# Patient Record
Sex: Male | Born: 1940
Health system: Southern US, Community
[De-identification: ages and names within clinical notes are randomized; demographics above are authoritative.]

## PROBLEM LIST (undated history)

## (undated) DIAGNOSIS — I714 Abdominal aortic aneurysm, without rupture, unspecified: Secondary | ICD-10-CM

## (undated) DIAGNOSIS — Z923 Personal history of irradiation: Secondary | ICD-10-CM

## (undated) DIAGNOSIS — K760 Fatty (change of) liver, not elsewhere classified: Secondary | ICD-10-CM

## (undated) DIAGNOSIS — E785 Hyperlipidemia, unspecified: Secondary | ICD-10-CM

## (undated) DIAGNOSIS — F17200 Nicotine dependence, unspecified, uncomplicated: Secondary | ICD-10-CM

## (undated) DIAGNOSIS — N139 Obstructive and reflux uropathy, unspecified: Secondary | ICD-10-CM

## (undated) DIAGNOSIS — N400 Enlarged prostate without lower urinary tract symptoms: Secondary | ICD-10-CM

## (undated) DIAGNOSIS — I10 Essential (primary) hypertension: Secondary | ICD-10-CM

## (undated) DIAGNOSIS — C3490 Malignant neoplasm of unspecified part of unspecified bronchus or lung: Secondary | ICD-10-CM

## (undated) DIAGNOSIS — G40909 Epilepsy, unspecified, not intractable, without status epilepticus: Secondary | ICD-10-CM

## (undated) DIAGNOSIS — M549 Dorsalgia, unspecified: Secondary | ICD-10-CM

## (undated) DIAGNOSIS — I2510 Atherosclerotic heart disease of native coronary artery without angina pectoris: Secondary | ICD-10-CM

## (undated) DIAGNOSIS — R972 Elevated prostate specific antigen [PSA]: Secondary | ICD-10-CM

## (undated) DIAGNOSIS — J3801 Paralysis of vocal cords and larynx, unilateral: Secondary | ICD-10-CM

## (undated) DIAGNOSIS — I739 Peripheral vascular disease, unspecified: Secondary | ICD-10-CM

## (undated) DIAGNOSIS — R310 Gross hematuria: Secondary | ICD-10-CM

## (undated) DIAGNOSIS — R0602 Shortness of breath: Secondary | ICD-10-CM

## (undated) DIAGNOSIS — K5790 Diverticulosis of intestine, part unspecified, without perforation or abscess without bleeding: Secondary | ICD-10-CM

## (undated) DIAGNOSIS — H9190 Unspecified hearing loss, unspecified ear: Secondary | ICD-10-CM

## (undated) DIAGNOSIS — I219 Acute myocardial infarction, unspecified: Secondary | ICD-10-CM

## (undated) DIAGNOSIS — K8020 Calculus of gallbladder without cholecystitis without obstruction: Secondary | ICD-10-CM

## (undated) DIAGNOSIS — J189 Pneumonia, unspecified organism: Secondary | ICD-10-CM

## (undated) DIAGNOSIS — C3412 Malignant neoplasm of upper lobe, left bronchus or lung: Secondary | ICD-10-CM

## (undated) HISTORY — DX: Diverticulosis of intestine, part unspecified, without perforation or abscess without bleeding: K57.90

## (undated) HISTORY — DX: Hyperlipidemia, unspecified: E78.5

## (undated) HISTORY — DX: Elevated prostate specific antigen (PSA): R97.20

## (undated) HISTORY — DX: Abdominal aortic aneurysm, without rupture, unspecified: I71.40

## (undated) HISTORY — DX: Nicotine dependence, unspecified, uncomplicated: F17.200

## (undated) HISTORY — DX: Gross hematuria: R31.0

## (undated) HISTORY — DX: Atherosclerotic heart disease of native coronary artery without angina pectoris: I25.10

## (undated) HISTORY — DX: Acute myocardial infarction, unspecified: I21.9

## (undated) HISTORY — PX: ROTATOR CUFF REPAIR: SHX139

## (undated) HISTORY — PX: HERNIA REPAIR: SHX51

## (undated) HISTORY — DX: Fatty (change of) liver, not elsewhere classified: K76.0

## (undated) HISTORY — DX: Pneumonia, unspecified organism: J18.9

## (undated) HISTORY — DX: Calculus of gallbladder without cholecystitis without obstruction: K80.20

## (undated) HISTORY — DX: Malignant neoplasm of upper lobe, left bronchus or lung: C34.12

## (undated) HISTORY — PX: CORONARY ANGIOPLASTY: SHX604

## (undated) HISTORY — DX: Paralysis of vocal cords and larynx, unilateral: J38.01

## (undated) HISTORY — DX: Epilepsy, unspecified, not intractable, without status epilepticus: G40.909

## (undated) HISTORY — DX: Benign prostatic hyperplasia without lower urinary tract symptoms: N40.0

## (undated) HISTORY — PX: PILONIDAL CYST EXCISION: SHX744

## (undated) HISTORY — DX: Malignant neoplasm of unspecified part of unspecified bronchus or lung: C34.90

## (undated) HISTORY — DX: Obstructive and reflux uropathy, unspecified: N13.9

## (undated) HISTORY — DX: Dorsalgia, unspecified: M54.9

## (undated) HISTORY — PX: OTHER SURGICAL HISTORY: SHX169

## (undated) HISTORY — DX: Abdominal aortic aneurysm, without rupture: I71.4

---

## 1990-08-21 DIAGNOSIS — I219 Acute myocardial infarction, unspecified: Secondary | ICD-10-CM

## 1990-08-21 HISTORY — DX: Acute myocardial infarction, unspecified: I21.9

## 1990-08-21 HISTORY — PX: PTCA: SHX146

## 2001-04-12 ENCOUNTER — Emergency Department (HOSPITAL_COMMUNITY): Admission: EM | Admit: 2001-04-12 | Discharge: 2001-04-12 | Payer: Self-pay | Admitting: Emergency Medicine

## 2001-08-21 HISTORY — PX: COLONOSCOPY W/ POLYPECTOMY: SHX1380

## 2003-07-21 ENCOUNTER — Ambulatory Visit (HOSPITAL_COMMUNITY): Admission: RE | Admit: 2003-07-21 | Discharge: 2003-07-21 | Payer: Self-pay | Admitting: *Deleted

## 2003-07-21 ENCOUNTER — Encounter (INDEPENDENT_AMBULATORY_CARE_PROVIDER_SITE_OTHER): Payer: Self-pay | Admitting: Specialist

## 2007-04-25 ENCOUNTER — Encounter (INDEPENDENT_AMBULATORY_CARE_PROVIDER_SITE_OTHER): Payer: Self-pay | Admitting: *Deleted

## 2007-05-17 ENCOUNTER — Ambulatory Visit: Payer: Self-pay | Admitting: Internal Medicine

## 2007-05-18 LAB — CONVERTED CEMR LAB
ALT: 32 units/L (ref 0–53)
AST: 26 units/L (ref 0–37)
Cholesterol: 153 mg/dL (ref 0–200)
HDL: 48.5 mg/dL (ref >39.0)
LDL Cholesterol: 77 mg/dL (ref 0–99)
PSA: 3.33 ng/mL (ref 0.10–4.00)
Total CHOL/HDL Ratio: 3.2
Triglycerides: 136 mg/dL (ref 0–149)
VLDL: 27 mg/dL (ref 0–40)

## 2007-05-20 ENCOUNTER — Encounter (INDEPENDENT_AMBULATORY_CARE_PROVIDER_SITE_OTHER): Payer: Self-pay | Admitting: *Deleted

## 2007-05-23 ENCOUNTER — Ambulatory Visit: Payer: Self-pay | Admitting: Internal Medicine

## 2007-05-23 DIAGNOSIS — I252 Old myocardial infarction: Secondary | ICD-10-CM | POA: Insufficient documentation

## 2007-05-23 DIAGNOSIS — E785 Hyperlipidemia, unspecified: Secondary | ICD-10-CM | POA: Insufficient documentation

## 2007-05-27 ENCOUNTER — Encounter: Payer: Self-pay | Admitting: Internal Medicine

## 2007-07-02 ENCOUNTER — Ambulatory Visit: Payer: Self-pay | Admitting: Internal Medicine

## 2007-07-03 ENCOUNTER — Ambulatory Visit: Payer: Self-pay | Admitting: Internal Medicine

## 2007-07-05 ENCOUNTER — Encounter (INDEPENDENT_AMBULATORY_CARE_PROVIDER_SITE_OTHER): Payer: Self-pay | Admitting: *Deleted

## 2007-07-05 LAB — CONVERTED CEMR LAB
Creatinine,U: 43 mg/dL
Glucose, Bld: 113 mg/dL — ABNORMAL HIGH (ref 70–99)
Hgb A1c MFr Bld: 5.7 % (ref 4.6–6.0)
Microalb Creat Ratio: 20.9 mg/g (ref 0.0–30.0)
Microalb, Ur: 0.9 mg/dL (ref 0.0–1.9)

## 2007-09-06 ENCOUNTER — Encounter: Payer: Self-pay | Admitting: Internal Medicine

## 2008-02-24 ENCOUNTER — Ambulatory Visit: Payer: Self-pay | Admitting: Internal Medicine

## 2008-02-24 LAB — CONVERTED CEMR LAB
Bilirubin Urine: NEGATIVE
Blood in Urine, dipstick: NEGATIVE
Glucose, Urine, Semiquant: NEGATIVE
Ketones, urine, test strip: NEGATIVE
Nitrite: NEGATIVE
Protein, U semiquant: NEGATIVE
Specific Gravity, Urine: <1.005
Urobilinogen, UA: 0.2
WBC Urine, dipstick: NEGATIVE
pH: 7

## 2008-04-16 ENCOUNTER — Telehealth (INDEPENDENT_AMBULATORY_CARE_PROVIDER_SITE_OTHER): Payer: Self-pay | Admitting: *Deleted

## 2008-05-08 ENCOUNTER — Ambulatory Visit: Payer: Self-pay | Admitting: Internal Medicine

## 2008-05-08 LAB — CONVERTED CEMR LAB
ALT: 37 units/L (ref 0–53)
AST: 33 units/L (ref 0–37)
Albumin: 4 g/dL (ref 3.5–5.2)
Alkaline Phosphatase: 44 units/L (ref 39–117)
BUN: 13 mg/dL (ref 6–23)
Basophils Absolute: 0 10*3/uL (ref 0.0–0.1)
Basophils Relative: 0.3 % (ref 0.0–3.0)
Bilirubin, Direct: 0.1 mg/dL (ref 0.0–0.3)
CO2: 30 meq/L (ref 19–32)
Calcium: 9.6 mg/dL (ref 8.4–10.5)
Chloride: 108 meq/L (ref 96–112)
Cholesterol: 94 mg/dL (ref 0–200)
Creatinine, Ser: 0.9 mg/dL (ref 0.4–1.5)
Eosinophils Absolute: 0.2 10*3/uL (ref 0.0–0.7)
Eosinophils Relative: 2.6 % (ref 0.0–5.0)
GFR calc Af Amer: 109 mL/min
GFR calc non Af Amer: 90 mL/min
Glucose, Bld: 108 mg/dL — ABNORMAL HIGH (ref 70–99)
HCT: 45.4 % (ref 39.0–52.0)
HDL: 34.5 mg/dL — ABNORMAL LOW (ref >39.0)
Hemoglobin: 16 g/dL (ref 13.0–17.0)
Hgb A1c MFr Bld: 5.6 % (ref 4.6–6.0)
LDL Cholesterol: 48 mg/dL (ref 0–99)
Lymphocytes Relative: 20.3 % (ref 12.0–46.0)
MCHC: 35.2 g/dL (ref 30.0–36.0)
MCV: 94.8 fL (ref 78.0–100.0)
Monocytes Absolute: 0.4 10*3/uL (ref 0.1–1.0)
Monocytes Relative: 5.2 % (ref 3.0–12.0)
Neutro Abs: 5.8 10*3/uL (ref 1.4–7.7)
Neutrophils Relative %: 71.6 % (ref 43.0–77.0)
PSA: 3.2 ng/mL (ref 0.10–4.00)
Platelets: 252 10*3/uL (ref 150–400)
Potassium: 5.5 meq/L — ABNORMAL HIGH (ref 3.5–5.1)
RBC: 4.8 M/uL (ref 4.22–5.81)
RDW: 13.9 % (ref 11.5–14.6)
Sodium: 142 meq/L (ref 135–145)
TSH: 1.02 microintl units/mL (ref 0.35–5.50)
Total Bilirubin: 0.7 mg/dL (ref 0.3–1.2)
Total CHOL/HDL Ratio: 2.7
Total Protein: 7.4 g/dL (ref 6.0–8.3)
Triglycerides: 56 mg/dL (ref 0–149)
VLDL: 11 mg/dL (ref 0–40)
WBC: 8 10*3/uL (ref 4.5–10.5)

## 2008-05-15 ENCOUNTER — Ambulatory Visit: Payer: Self-pay | Admitting: Internal Medicine

## 2008-05-15 DIAGNOSIS — R7309 Other abnormal glucose: Secondary | ICD-10-CM | POA: Insufficient documentation

## 2008-07-09 ENCOUNTER — Encounter: Payer: Self-pay | Admitting: Internal Medicine

## 2009-06-30 ENCOUNTER — Encounter: Payer: Self-pay | Admitting: Internal Medicine

## 2009-07-01 ENCOUNTER — Ambulatory Visit: Payer: Self-pay | Admitting: Internal Medicine

## 2009-07-01 LAB — CONVERTED CEMR LAB
ALT: 31 units/L (ref 0–53)
AST: 31 units/L (ref 0–37)
Albumin: 4.1 g/dL (ref 3.5–5.2)
Alkaline Phosphatase: 52 units/L (ref 39–117)
BUN: 10 mg/dL (ref 6–23)
Basophils Absolute: 0 10*3/uL (ref 0.0–0.1)
Basophils Relative: 0.4 % (ref 0.0–3.0)
Bilirubin, Direct: 0.1 mg/dL (ref 0.0–0.3)
CO2: 28 meq/L (ref 19–32)
Calcium: 9.4 mg/dL (ref 8.4–10.5)
Chloride: 105 meq/L (ref 96–112)
Cholesterol: 137 mg/dL (ref 0–200)
Creatinine, Ser: 0.7 mg/dL (ref 0.4–1.5)
Eosinophils Absolute: 0.2 10*3/uL (ref 0.0–0.7)
Eosinophils Relative: 2.9 % (ref 0.0–5.0)
GFR calc non Af Amer: 119.4 mL/min (ref >60)
Glucose, Bld: 110 mg/dL — ABNORMAL HIGH (ref 70–99)
HCT: 44.5 % (ref 39.0–52.0)
HDL: 47.3 mg/dL (ref >39.00)
Hemoglobin: 15.1 g/dL (ref 13.0–17.0)
Hgb A1c MFr Bld: 5.7 % (ref 4.6–6.5)
LDL Cholesterol: 80 mg/dL (ref 0–99)
Lymphocytes Relative: 27.5 % (ref 12.0–46.0)
Lymphs Abs: 1.6 10*3/uL (ref 0.7–4.0)
MCHC: 34 g/dL (ref 30.0–36.0)
MCV: 96.5 fL (ref 78.0–100.0)
Monocytes Absolute: 0.5 10*3/uL (ref 0.1–1.0)
Monocytes Relative: 8.4 % (ref 3.0–12.0)
Neutro Abs: 3.5 10*3/uL (ref 1.4–7.7)
Neutrophils Relative %: 60.8 % (ref 43.0–77.0)
PSA: 3.01 ng/mL (ref 0.10–4.00)
Platelets: 229 10*3/uL (ref 150.0–400.0)
Potassium: 4.9 meq/L (ref 3.5–5.1)
RBC: 4.61 M/uL (ref 4.22–5.81)
RDW: 13.6 % (ref 11.5–14.6)
Sodium: 141 meq/L (ref 135–145)
Total Bilirubin: 0.7 mg/dL (ref 0.3–1.2)
Total CHOL/HDL Ratio: 3
Total Protein: 7 g/dL (ref 6.0–8.3)
Triglycerides: 47 mg/dL (ref 0.0–149.0)
VLDL: 9.4 mg/dL (ref 0.0–40.0)
WBC: 5.8 10*3/uL (ref 4.5–10.5)

## 2009-07-08 ENCOUNTER — Ambulatory Visit: Payer: Self-pay | Admitting: Internal Medicine

## 2009-09-24 ENCOUNTER — Telehealth: Payer: Self-pay | Admitting: Internal Medicine

## 2009-10-01 ENCOUNTER — Encounter: Payer: Self-pay | Admitting: Internal Medicine

## 2010-02-24 ENCOUNTER — Ambulatory Visit: Payer: Self-pay | Admitting: Internal Medicine

## 2010-02-24 DIAGNOSIS — D126 Benign neoplasm of colon, unspecified: Secondary | ICD-10-CM | POA: Insufficient documentation

## 2010-02-24 DIAGNOSIS — Z8669 Personal history of other diseases of the nervous system and sense organs: Secondary | ICD-10-CM | POA: Insufficient documentation

## 2010-02-24 LAB — CONVERTED CEMR LAB
Bilirubin Urine: NEGATIVE
Blood in Urine, dipstick: NEGATIVE
Cholesterol, target level: 200 mg/dL
Glucose, Urine, Semiquant: NEGATIVE
HDL goal, serum: 40 mg/dL
Ketones, urine, test strip: NEGATIVE
LDL Goal: 100 mg/dL
Nitrite: NEGATIVE
Protein, U semiquant: NEGATIVE
Specific Gravity, Urine: <1.005
Urobilinogen, UA: 0.2
WBC Urine, dipstick: NEGATIVE
pH: 7

## 2010-03-15 ENCOUNTER — Encounter (INDEPENDENT_AMBULATORY_CARE_PROVIDER_SITE_OTHER): Payer: Self-pay | Admitting: *Deleted

## 2010-03-17 ENCOUNTER — Ambulatory Visit: Payer: Self-pay | Admitting: Internal Medicine

## 2010-03-31 ENCOUNTER — Ambulatory Visit: Payer: Self-pay | Admitting: Internal Medicine

## 2010-03-31 ENCOUNTER — Telehealth: Payer: Self-pay | Admitting: Internal Medicine

## 2010-03-31 LAB — HM COLONOSCOPY

## 2010-05-06 ENCOUNTER — Telehealth (INDEPENDENT_AMBULATORY_CARE_PROVIDER_SITE_OTHER): Payer: Self-pay | Admitting: *Deleted

## 2010-06-08 ENCOUNTER — Telehealth (INDEPENDENT_AMBULATORY_CARE_PROVIDER_SITE_OTHER): Payer: Self-pay | Admitting: *Deleted

## 2010-06-10 ENCOUNTER — Encounter: Payer: Self-pay | Admitting: Internal Medicine

## 2010-06-15 ENCOUNTER — Telehealth: Payer: Self-pay | Admitting: Internal Medicine

## 2010-06-28 ENCOUNTER — Ambulatory Visit: Payer: Self-pay | Admitting: Internal Medicine

## 2010-07-05 ENCOUNTER — Ambulatory Visit: Payer: Self-pay | Admitting: Internal Medicine

## 2010-07-05 DIAGNOSIS — K5730 Diverticulosis of large intestine without perforation or abscess without bleeding: Secondary | ICD-10-CM | POA: Insufficient documentation

## 2010-08-31 ENCOUNTER — Encounter: Payer: Self-pay | Admitting: Internal Medicine

## 2010-09-18 LAB — CONVERTED CEMR LAB
ALT: 26 units/L (ref 0–53)
AST: 26 units/L (ref 0–37)
Albumin: 4.1 g/dL (ref 3.5–5.2)
Alkaline Phosphatase: 44 units/L (ref 39–117)
BUN: 17 mg/dL (ref 6–23)
Basophils Absolute: 0.1 10*3/uL (ref 0.0–0.1)
Basophils Relative: 0.8 % (ref 0.0–3.0)
Bilirubin, Direct: 0.1 mg/dL (ref 0.0–0.3)
CO2: 28 meq/L (ref 19–32)
Calcium: 9.6 mg/dL (ref 8.4–10.5)
Chloride: 104 meq/L (ref 96–112)
Cholesterol: 172 mg/dL (ref 0–200)
Creatinine, Ser: 0.7 mg/dL (ref 0.4–1.5)
Eosinophils Absolute: 0.2 10*3/uL (ref 0.0–0.7)
Eosinophils Relative: 3.1 % (ref 0.0–5.0)
GFR calc non Af Amer: 113.42 mL/min (ref >60)
Glucose, Bld: 107 mg/dL — ABNORMAL HIGH (ref 70–99)
HCT: 45.4 % (ref 39.0–52.0)
HDL: 50.9 mg/dL (ref >39.00)
Hemoglobin: 15.8 g/dL (ref 13.0–17.0)
Hgb A1c MFr Bld: 5.7 % (ref 4.6–6.5)
LDL Cholesterol: 105 mg/dL — ABNORMAL HIGH (ref 0–99)
Lymphocytes Relative: 32 % (ref 12.0–46.0)
Lymphs Abs: 2.3 10*3/uL (ref 0.7–4.0)
MCHC: 34.9 g/dL (ref 30.0–36.0)
MCV: 94.3 fL (ref 78.0–100.0)
Monocytes Absolute: 0.6 10*3/uL (ref 0.1–1.0)
Monocytes Relative: 8.1 % (ref 3.0–12.0)
Neutro Abs: 3.9 10*3/uL (ref 1.4–7.7)
Neutrophils Relative %: 56 % (ref 43.0–77.0)
PSA: 3.77 ng/mL (ref 0.10–4.00)
Platelets: 235 10*3/uL (ref 150.0–400.0)
Potassium: 4.6 meq/L (ref 3.5–5.1)
RBC: 4.82 M/uL (ref 4.22–5.81)
RDW: 14.7 % — ABNORMAL HIGH (ref 11.5–14.6)
Sodium: 139 meq/L (ref 135–145)
TSH: 1.16 microintl units/mL (ref 0.35–5.50)
Total Bilirubin: 0.5 mg/dL (ref 0.3–1.2)
Total CHOL/HDL Ratio: 3
Total Protein: 6.9 g/dL (ref 6.0–8.3)
Triglycerides: 79 mg/dL (ref 0.0–149.0)
VLDL: 15.8 mg/dL (ref 0.0–40.0)
WBC: 7.1 10*3/uL (ref 4.5–10.5)

## 2010-09-20 NOTE — Miscellaneous (Signed)
Summary: [facility]  Clinical Lists Changes  Medications: Added new medication of MIRALAX   POWD (POLYETHYLENE GLYCOL 3350) As per prep  instructions. - Signed Added new medication of DULCOLAX 5 MG  TBEC (BISACODYL) Day before procedure take 2 at 3pm and 2 at 8pm. - Signed Added new medication of REGLAN 10 MG  TABS (METOCLOPRAMIDE HCL) As per prep instructions. - Signed Rx of MIRALAX   POWD (POLYETHYLENE GLYCOL 3350) As per prep  instructions.;  #255gm x 0;  Signed;  Entered by: Ezra Sites RN;  Authorized by: Hart Carwin MD;  Method used: [facility]  [phone]*, [address] [place], [place], [place]  [address], Ph: [phone] or [phone], Fax: [phone] Rx of DULCOLAX 5 MG  TBEC (BISACODYL) Day before procedure take 2 at 3pm and 2 at 8pm.;  #4 x 0;  Signed;  Entered by: Ezra Sites RN;  Authorized by: Hart Carwin MD;  Method used: [facility]  [phone]*, [address] [place], [place], [place]  [address], Ph: [phone] or [phone], Fax: [phone] Rx of REGLAN 10 MG  TABS (METOCLOPRAMIDE HCL) As per prep instructions.;  #2 x 0;  Signed;  Entered by: Ezra Sites RN;  Authorized by: Hart Carwin MD;  Method used: [facility]ically [phone]*, [address] [place], [place], [place]  [address], Ph: [phone] or [phone], Fax: [phone] Observations: Added new observation of NKA: T (03/17/2010 10:33)    Prescriptions: REGLAN 10 MG  TABS (METOCLOPRAMIDE HCL) As per prep instructions.  #2 x 0   Entered by:   Ezra Sites RN   Authorized by:   Hart Carwin MD   Signed by:   Ezra Sites RN on 03/17/2010   Method used:   [facility]        [facility] [facility] [phone]       [address]       [place], [place]  [address]       Ph: [phone] or [phone]       Fax: [phone]   RxID:   [id number] DULCOLAX 5 MG  TBEC (BISACODYL) Day before procedure take 2 at 3pm and 2 at 8pm.  #4 x 0   Entered by:   Ezra Sites  RN   Authorized by:   Hart Carwin MD   Signed by:   Ezra Sites RN on 03/17/2010   Method used:   [facility]ronically to        [facility]  [address] [phone][facility] (retail)       [address] [place], [place]  [address]       Ph: [phone] or [phone]       Fax: [phone]   RxID:   [id number] MIRALAX   POWD (POLYETHYLENE GLYCOL 3350) As per prep  instructions.  #255gm x 0   Entered by:   Ezra Sites RN   Authorized by:   Hart Carwin MD   Signed by:   Ezra Sites RN on 03/17/2010   Method used:   [facility]ronically [facility]        [facility]  [address] [phone]* (retail)       [address]       [place], [place]  [address]  Ph: [phone] or [phone]       Fax: [phone]   RxID:   [id number]

## 2010-09-20 NOTE — Letter (Signed)
Summary: Sport and exercise psychologist Admin   Imported By: Freddy Jaksch 09/26/2007 11:56:53  _____________________________________________________________________  External Attachment:    Type:   Image     Comment:   External Document

## 2010-09-20 NOTE — Progress Notes (Signed)
----   Converted from flag ---- ---- 06/08/2010 5:32 AM, Marga Melnick MD wrote: Code is CAD ------------------------------

## 2010-09-20 NOTE — Letter (Signed)
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

## 2010-09-20 NOTE — Progress Notes (Signed)
Summary: Medication Change Request (Non-Urgent)  Phone Note Refill Request Message from:  [facility] [phone]  vytorin 10-10mg ....Marland KitchenMarland KitchenMarland Kitchen May we change to simvastatin significant cost savings! Thanks!   Initial call taken by: Barb Merino,  September 24, 2009 4:48 PM  Follow-up for Phone Call        Dr.Hopper please advise on med change Follow-up by: Shonna Chock,  September 24, 2009 4:55 PM  Additional Follow-up for Phone Call Additional follow up Details #1::        Simvastatin 20 mg at bedtime #90 if patient agrees Additional Follow-up by: Marga Melnick MD,  September 24, 2009 5:30 PM    Additional Follow-up for Phone Call Additional follow up Details #2::    pt ok, rx sent to pharmacy...............Marland KitchenFelecia Deloach CMA  September 27, 2009 9:12 AM   New/Updated Medications: SIMVASTATIN 20 MG TABS (SIMVASTATIN) take 1 tab at bedtime Prescriptions: SIMVASTATIN 20 MG TABS (SIMVASTATIN) take 1 tab at bedtime  #90 x 0   Entered by:   Jeremy Johann CMA   Authorized by:   Marga Melnick MD   Signed by:   Jeremy Johann CMA on 09/27/2009   Method used:   [facility]ed to ...       [facility]  [address] [phone][facility] ([facility]ail[facility]       [address]       [place], [place]  [address]       Ph: [phone] or [phone]       Fax: [phone]   RxID:   [id number]

## 2010-09-20 NOTE — Progress Notes (Signed)
Summary: lab order added to lab appt  Phone Note Call from Patient   Caller: Patient Summary of Call: patient has lab appt scheduled for 05/08/08 need order for labs has follow up appt scheduled 05/15/08 Initial call taken by: Doristine Devoid,  April 16, 2008 8:58 AM  Follow-up for Phone Call        LIPID,hepatic panel,PSA,BMP,CBCD,TSH 995.20/600.9/272.4  DR.HOPPER THIS PATIENT WAS HERE 02/2008 FOR FLIGHT CPX-NO LABS WERE DRAWN. WOULD I ADD CPX CODE TO THESE LABS?  Follow-up by: Shonna Chock,  April 16, 2008 11:03 AM  Additional Follow-up for Phone Call Additional follow up Details #1::        no, just do labs above Additional Follow-up by: Marga Melnick MD,  April 16, 2008 5:53 PM

## 2010-09-20 NOTE — Assessment & Plan Note (Signed)
Summary: FOLLOWUP VISIT///SPH   Vital Signs:  Patient profile:   70 year old male Weight:      145.8 pounds BMI:     24.35 Pulse rate:   76 / minute Resp:     14 per minute BP sitting:   128 / 80  (left arm) Cuff size:   regular  Vitals Entered By: Shonna Chock CMA (July 05, 2010 8:53 AM) CC: Follow-up visit: discuss labs (copy given)   CC:  Follow-up visit: discuss labs (copy given).  History of Present Illness: FAA requires a Cardiac update.He has had his Nuclear Stress test 06/28/2010.No symptoms with pulse to 174 by his report. Actual tracings pending. Hyperlipidemia Follow-Up      Labs reviewed : LDL 105 ; HDL 50.90; LFTs are all normal.Glucose 107 , but A1c is in NON Diabetes range of 5.7%. The patient denies muscle aches, GI upset, abdominal pain, flushing, itching, constipation, diarrhea, and fatigue.  The patient denies the following symptoms: chest pain/pressure, exercise intolerance, dypsnea, palpitations, syncope, and pedal edema.  Compliance with medications (by patient report) has been near 100%.  Dietary compliance has been good.  The patient reports exercising 2 X per week as biking 8 mpd over 60 minutes w/o cardiopulmonary symptoms.  Adjunctive measures currently used by the patient include ASA, fish oil supplements, and Co-Q 10.   Preventive Screening-Counseling & Management  Alcohol-Tobacco     Smoking Status: quit  Current Medications (verified): 1)  Co Q10 2)  Asa 81mg  3)  Multivitamin 4)  Simvastatin 20 Mg Tabs (Simvastatin) .... Take 1 Tab At Bedtime 5)  Fish Oil 1000 Mg Caps (Omega-3 Fatty Acids) .Marland Kitchen.. 1 By Mouth Two Times A Day  Allergies (verified): No Known Drug Allergies  Past History:  Past Medical History: Myocardial infarction, PMH of in 1992 Hyperlipidemia: Framingham Study LDL goal = < 100. Diverticulosis, colon 03/2010, Dr Juanda Chance  Past Surgical History: Percutaneous transluminal coronary angioplasty 1992 Inguinal herniorrhaphy  bilaterally Colon polypectomy 2003 Dr Virginia Rochester,  no polyps in 03/2010, Dr Juanda Chance  Rotator cuff repair R & L shoulder  Pilonidal Cystectomy  Family History: Father: negative Mother: HTN Siblings: negative No FH of MI or premature CAD  Social History: heart healthy diet Retired Married Former Smoker: quit 1992 Alcohol use-yes: red wine socialy Smoking Status:  quit  Review of Systems General:  Denies sleep disorder and weight loss. Eyes:  Denies blurring, double vision, and vision loss-both eyes. ENT:  Denies difficulty swallowing and hoarseness. CV:  Denies difficulty breathing at night, difficulty breathing while lying down, leg cramps with exertion, lightheadness, and near fainting. Resp:  Denies cough, shortness of breath, and sputum productive. Derm:  Denies lesion(s), poor wound healing, and rash. Neuro:  Denies numbness, tingling, and weakness. Endo:  Denies cold intolerance, excessive hunger, excessive thirst, excessive urination, and heat intolerance. Heme:  Denies abnormal bruising and bleeding.  Physical Exam  General:  Thin but well-nourished;alert,appropriate and cooperative throughout examination Eyes:  No corneal or conjunctival inflammation noted.  Funduscopic exam benign, without hemorrhages, exudates or papilledema.  S/P Ophthalmologic exam 02/2010 Neck:  No deformities, masses, or tenderness noted. Lungs:  Normal respiratory effort, chest expands symmetrically. Lungs are clear to auscultation, no crackles or wheezes. Heart:  Normal rate and regular rhythm. S1 and S2 normal without gallop, murmur, click, rub. S4  Abdomen:  Bowel sounds positive,abdomen soft and non-tender without masses, organomegaly or hernias noted. Pulses:  R and L carotid,radial,dorsalis pedis and posterior tibial pulses are full and  equal bilaterally Extremities:  No clubbing, cyanosis, edema. Mild fungal toenail deformities  noted . Neurologic:  alert & oriented X3, gait normal, and DTRs  symmetrical and normal.   Skin:  Intact without suspicious lesions or rashes Cervical Nodes:  No lymphadenopathy noted Axillary Nodes:  No palpable lymphadenopathy Psych:  memory intact for recent and remote, normally interactive, and good eye contact.     Impression & Recommendations:  Problem # 1:  MYOCARDIAL INFARCTION, HX OF (ICD-412) stable ; no active symptoms even @ high levels of CVE  Problem # 2:  HYPERLIPIDEMIA (ICD-272.4) Lipids controlled His updated medication list for this problem includes:    Simvastatin 20 Mg Tabs (Simvastatin) .Marland Kitchen... Take 1 tab at bedtime  Problem # 3:  HYPERGLYCEMIA, FASTING (ICD-790.29) A1c is non Diabetic  Problem # 4:  AMBLYOPIA, HX OF (ICD-V12.49) as per Dr London Sheer  Complete Medication List: 1)  Co Q10  2)  Asa 81mg   3)  Multivitamin  4)  Simvastatin 20 Mg Tabs (Simvastatin) .... Take 1 tab at bedtime 5)  Fish Oil 1000 Mg Caps (Omega-3 fatty acids) .Marland Kitchen.. 1 by mouth two times a day  Other Orders: Flu Vaccine 31yrs + MEDICARE PATIENTS (U1324) Administration Flu vaccine - MCR (M0102)  Patient Instructions: 1)  It is important that you exercise regularly at least 20 minutes 5 times a week. If you develop chest pain, have severe difficulty breathing, or feel very tired , stop exercising immediately and seek medical attention. 2)  Take an  81 mg coated Aspirin every day.   Orders Added: 1)  Flu Vaccine 47yrs + MEDICARE PATIENTS [Q2039] 2)  Administration Flu vaccine - MCR [G0008] 3)  Est. Patient Level IV [[address]] Flu Vaccine Consent Questions     Do you have a history of severe allergic reactions to this vaccine? no    Any prior history of allergic reactions to egg and/or gelatin? no    Do you have a sensitivity to the preservative Thimersol? no    Do you have a past history of Guillan-Barre Syndrome? no    Do you currently have an acute febrile illness? no    Have you ever had a severe reaction to latex? no    Vaccine information  given and explained to patient? yes    Are you currently pregnant? no    Lot Number:[id number]   Exp Date:02/18/2011   Site Given  Right Deltoid IMu vaccine - MCR [G0008]      .lbmedflu

## 2010-09-20 NOTE — Miscellaneous (Signed)
Summary: Orders Update  Clinical Lists Changes  Orders: Added new Referral order of Cardiolite (Cardiolite) - Signed 

## 2010-09-20 NOTE — Progress Notes (Signed)
Summary: still waiting for stress test appointment  Phone Note Call from Patient   Caller: Patient Summary of Call: Patient called and left message that he has not heard about his stress test appointment yet---acknowledges that he does have lab appointment and followup appointment with Dr Alwyn Ren Initial call taken by: Jerolyn Shin,  June 15, 2010 3:09 PM  Follow-up for Phone Call        Chrae, in reference to the flag I sent you on this patient about there being no referral or clinicals to pre-cert for a stress test, what is the status?  I had went ahead & entered the referral for stress test, but I need to know if this is what he wants ordered & how I am supposed to pre-cert with the insurance co.  Also Dr. Alwyn Ren sent Sarah the flag about a referral for an eye exam & there also still is not one.  Please advise. Follow-up by: Magdalen Spatz [employer],  June 15, 2010 3:18 PM  Additional Follow-up for Phone Call Additional follow up Details #1::        This is for Nuclear Stress Test required by the FAA ( as done previously) ; unfortunately his insurance may not cover it since it is for the [facility] assess CAD. Additional Follow-up by: Marga Melnick MD,  June 15, 2010 4:58 PM    Additional Follow-up for Phone Call Additional follow up Details #2::    I HAVE CALLED [facility]2, HAVE LVM FOR "KATHY" TO ME BACK IN REF TO THIS PT ASAP, PRE-CERT/INSUR CODING IS WHAT IS HOLDING THIS UP, AWAITING KATHY'S CB, PT AWARE OF ALL ABOVE Magdalen Spatz [facility]  June 20, 2010 2:42 PM   REC'D CALL BACK FROM KATHY/[facility], WHO NOW HAS ME TO CALL PAT/[facility]/DR. TYSINGER'S OFFICE, ph [phone].  KATHY STATES PT HAD ONLY ROUTINE TREADMILL LAST TIME, THEN RESULTS & THE BILL WERE FORWAREDED TO DR. Aleen Campi.  I AM NOW AWAITING CB FROM PAT Magdalen Spatz [facility]  June 20, 2010 3:02 PM   Additional Follow-up for Phone Call Additional follow up Details #3:: Details for Additional Follow-up Action Taken: THE  PRIOR STRESS TEST PT HAD WAS ONLY GXT/TREADMILL ONLY/NON NUCLEAR (CPT 93015) PER [facility].  REFERRAL SENT TO [facility] FOR TREADMILL STRESS, THEY CONTACT PT, THEN CONTACT ME W/APPT INFO.  I HAVE INFORMED PATIENT.  Additional Follow-up by: Magdalen Spatz [facility],  June 22, 2010 9:05 AM

## 2010-09-20 NOTE — Assessment & Plan Note (Signed)
Summary: 3rd class cpx/cbs   Vital Signs:  Patient profile:   70 year old male Height:      65 inches Weight:      142.6 pounds BMI:     23.82 Temp:     98.5 degrees F oral Pulse rate:   76 / minute Resp:     14 per minute BP sitting:   130 / 86  (left arm) Cuff size:   regular  Vitals Entered By: Shonna Chock (February 24, 2010 1:08 PM)  Comments REVIEWED MED LIST, PATIENT AGREED DOSE AND INSTRUCTION CORRECT    History of Present Illness: [name] is here for a  FAA 3rd Class  FAA  (NOT a Medicare Preventive F/U Exam).He is asymptomatic. Hyperlipidemia Follow-Up       The patient denies muscle aches, GI upset, abdominal pain, flushing, itching, constipation, diarrhea, and fatigue.  The patient denies the following symptoms: chest pain/pressure, exercise intolerance, dypsnea, palpitations, syncope, and pedal edema.  Compliance with medications (by patient report) has been near 100%.  Dietary compliance has been good.  The patient reports exercising daily.  Adjunctive measures currently used by the patient include ASA, fish oil supplements, and Co-QA.    Lipid Management History:      Positive NCEP/ATP III risk factors include male age 82 years old or older and ASHD (either angina/prior MI/prior CABG).  Negative NCEP/ATP III risk factors include non-diabetic, no family history for ischemic heart disease, non-tobacco-user status, non-hypertensive, no prior stroke/TIA, no peripheral vascular disease, and no history of aortic aneurysm.     Preventive Screening-Counseling & Management  Alcohol-Tobacco     Alcohol drinks/day: 2 occasionally     Smoking Status: quit > 6 months     Year Quit: 1992  Caffeine-Diet-Exercise     Does Patient Exercise: yes     Type of exercise: walking , swimming  Allergies (verified): No Known Drug Allergies  Past History:  Past Medical History: Myocardial infarction, PMH of 1992 Hyperlipidemia: Framingham Study LDL goal = < 100.  Past Surgical  History: Percutaneous transluminal coronary angioplasty 1992 Inguinal herniorrhaphy bilaterally Colon polypectomy 2003 Dr Virginia Rochester, due 2009(NOT done) Rotator cuff repair R & L shoulder  Pilonidal Cystectomy  Family History: Father: negative Mother: HTN Siblings: negative  Social History: Smoking Status:  quit > 6 months  Review of Systems  The patient denies anorexia, fever, vision loss, decreased hearing, hoarseness, prolonged cough, headaches, hemoptysis, abdominal pain, melena, hematochezia, severe indigestion/heartburn, hematuria, incontinence, suspicious skin lesions, depression, unusual weight change, abnormal bleeding, enlarged lymph nodes, and angioedema.         Weight stable; he has seasonal weight variance. Nasal congestion post smoke exposure 06/29-07/02 @ beach.  Physical Exam  General:  Thin but well-nourished,in no acute distress; alert,appropriate and cooperative throughout examination Head:  Normocephalic and atraumatic without obvious abnormalities. No apparent alopecia; beard & moustache Eyes:  No corneal or conjunctival inflammation noted. EOMI. Perrla. Funduscopic exam benign, without hemorrhages, exudates or papilledema. Arteriolar narrowing. Vision grossly normal. Ears:  External ear exam shows no significant lesions or deformities.  Otoscopic examination reveals clear canals, tympanic membranes are intact bilaterally without bulging, retraction, inflammation or discharge. Hearing is grossly normal bilaterally. Whisper @ 6 feet Nose:  External nasal examination shows no deformity or inflammation. Nasal mucosa are pink and moist without lesions or exudates. Mouth:  Oral mucosa and oropharynx without lesions or exudates.  Teeth in good repair. Neck:  No deformities, masses, or tenderness noted. Lungs:  Normal  respiratory effort, chest expands symmetrically. Lungs are clear to auscultation, no crackles or wheezes. Heart:  normal rate, regular rhythm, no gallop, no rub,  no JVD, no HJR, and grade 1 /6 systolic murmur.   Abdomen:  Bowel sounds positive,abdomen soft and non-tender without masses, organomegaly or hernias noted. Rectal:  no external abnormalities.   Genitalia:  Testes bilaterally descended without nodularity, tenderness or masses. No scrotal masses or lesions. No penis lesions or urethral discharge. Msk:  No deformity or scoliosis noted of thoracic or lumbar spine.   Pulses:  R and L carotid,radial,dorsalis pedis and posterior tibial pulses are full and equal bilaterally Extremities:  No clubbing, cyanosis, edema, or deformity noted with normal full range of motion of all joints.   Neurologic:  alert & oriented X3, gait normal, DTRs symmetrical and normal, finger-to-nose normal, and Romberg negative.   Skin:  Benign nevus L axilla. Op scars R&L inguinal areas, L post neck, R  & L shoulder, & above buttocks intergluteal space Cervical Nodes:  No lymphadenopathy noted Axillary Nodes:  No palpable lymphadenopathy Inguinal Nodes:  No significant adenopathy Psych:  memory intact for recent and remote, normally interactive, good eye contact, not anxious appearing, and not depressed appearing.     Impression & Recommendations:  Problem # 1:  ROUTINE GENERAL MEDICAL EXAM@HEALTH  CARE FACL (ICD-V70.0) FAA 3rd Class Exam  Problem # 2:  MYOCARDIAL INFARCTION, HX OF (ICD-412) Asymptomatic  Problem # 3:  COLONIC POLYPS (ICD-211.3)   F/U  recommended as per [facility] (discussed)  Orders: Gastroenterology Referral (GI)  Problem # 4:  HYPERLIPIDEMIA (ICD-272.4) LDL @ goal His updated medication list for this problem includes:    Simvastatin 20 Mg Tabs (Simvastatin) .Marland Kitchen... Take 1 tab at bedtime  Problem # 5:  AMBLYOPIA, HX OF (ICD-V12.49) as per Dr. Shea Evans  Complete Medication List: 1)  Co Q10  2)  Asa 81mg   3)  Multivitamin  4)  Simvastatin 20 Mg Tabs (Simvastatin) .... Take 1 tab at bedtime 5)  Fish Oil 1000 Mg Caps (Omega-3 fatty acids) .Marland Kitchen.. 1 by  mouth two times a day  Other Orders: UA Dipstick w/o Micro (manual) (16109)  Lipid Assessment/Plan:      Based on NCEP/ATP III, the patient's risk factor category is "history of coronary disease, peripheral vascular disease, cerebrovascular disease, or aortic aneurysm".  The patient's lipid goals are as follows: Total cholesterol goal is 200; LDL cholesterol goal is 100; HDL cholesterol goal is 40; Triglyceride goal is 150.  His LDL cholesterol goal has been met.    Patient Instructions: 1)  Please complete stool cards.    Laboratory Results   Urine Tests    Routine Urinalysis   Color: lt. yellow Appearance: Clear Glucose: negative   (Normal Range: Negative) Bilirubin: negative   (Normal Range: Negative) Ketone: negative   (Normal Range: Negative) Spec. Gravity: <1.005   (Normal Range: 1.003-1.035) Blood: negative   (Normal Range: Negative) pH: 7.0   (Normal Range: 5.0-8.0) Protein: negative   (Normal Range: Negative) Urobilinogen: 0.2   (Normal Range: 0-1) Nitrite: negative   (Normal Range: Negative) Leukocyte Esterace: negative   (Normal Range: Negative)

## 2010-09-20 NOTE — Letter (Signed)
Summary: Medical Certificate & Student Pilot Certificate/FAA  Medical Certificate & Student Pilot Certificate/FAA   Imported By: Lanelle Bal 03/03/2010 13:18:18  _____________________________________________________________________  External Attachment:    Type:   Image     Comment:   External Document

## 2010-09-20 NOTE — Letter (Signed)
Summary: [facility] Instructions  [facility]  [address] [place], [place] [address]   Phone: [phone]  Fax: [phone]       Jordan Mccullough    November 24, 1940    MRN: [id number]       Procedure Day /Date:  Thursday 03/31/2010     Arrival Time:  9:30 am     Procedure Time: 10:30 am     Location of Procedure:                    _x _  [facility] (4th Floor)   PREPARATION FOR COLONOSCOPY WITH MIRALAX  Starting 5 days prior to your procedure Saturday 8/6 do not eat nuts, seeds, popcorn, corn, beans, peas,  salads, or any raw vegetables.  Do not take any fiber supplements (e.g. Metamucil, Citrucel, and Benefiber). ____________________________________________________________________________________________________   THE DAY BEFORE YOUR PROCEDURE         DATE: Wednesday 8/10  1   Drink clear liquids the entire day-NO SOLID FOOD  2   Do not drink anything colored red or purple.  Avoid juices with pulp.  No orange juice.  3   Drink at least 64 oz. (8 glasses) of fluid/clear liquids during the day to prevent dehydration and help the prep work efficiently.  CLEAR LIQUIDS INCLUDE: Water Jello Ice Popsicles Tea (sugar ok, no milk/cream) Powdered fruit flavored drinks Coffee (sugar ok, no milk/cream) Gatorade Juice: apple, white grape, white cranberry  Lemonade Clear bullion, consomm, broth Carbonated beverages (any kind) Strained chicken noodle soup Hard Candy  4   Mix the entire bottle of Miralax with 64 oz. of Gatorade/Powerade in the morning and put in the refrigerator to chill.  5   At 3:00 pm take 2 Dulcolax/Bisacodyl tablets.  6   At 4:30 pm take one Reglan/Metoclopramide tablet.  7  Starting at 5:00 pm drink one 8 oz glass of the Miralax mixture every 15-20 minutes until you have finished drinking the entire 64 oz.  You should finish drinking prep around 7:30 or 8:00 pm.  8   If you are nauseated, you may take the 2nd Reglan/Metoclopramide  tablet at 6:30 pm.        9    At 8:00 pm take 2 more DULCOLAX/Bisacodyl tablets.     THE DAY OF YOUR PROCEDURE      DATE:  Thursday 8/11  You may drink clear liquids until 8:30 am  (2 HOURS BEFORE PROCEDURE).   MEDICATION INSTRUCTIONS  Unless otherwise instructed, you should take regular prescription medications with a small sip of water as early as possible the morning of your procedure.           OTHER INSTRUCTIONS  You will need a responsible adult at least 69 years of age to accompany you and drive you home.   This person must remain in the waiting room during your procedure.  Wear loose fitting clothing that is easily removed.  Leave jewelry and other valuables at home.  However, you may wish to bring a book to read or an iPod/MP3 player to listen to music as you wait for your procedure to start.  Remove all body piercing jewelry and leave at home.  Total time from sign-in until discharge is approximately 2-3 hours.  You should go home directly after your procedure and rest.  You can resume normal activities the day after your procedure.  The day of your procedure you should not:   Drive  Make legal decisions   Operate machinery   Drink alcohol   Return to work  You will receive specific instructions about eating, activities and medications before you leave.   The above instructions have been reviewed and explained to me by   Ezra Sites RN  March 17, 2010 10:54 AM     I fully understand and can verbalize these instructions _____________________________ Date _______

## 2010-09-20 NOTE — Letter (Signed)
Summary: Letter Regarding Airman Certificate/FAA  Letter Regarding Airman Certificate/FAA   Imported By: Lanelle Bal 10/15/2009 13:16:30  _____________________________________________________________________  External Attachment:    Type:   Image     Comment:   External Document

## 2010-09-20 NOTE — Assessment & Plan Note (Signed)
Summary: f/u labs//fd   Vital Signs:  Patient profile:   70 year old male Weight:      142.8 pounds BMI:     23.13 Pulse rate:   88 / minute Resp:     15 per minute BP sitting:   130 / 78  (left arm) Cuff size:   regular  Vitals Entered By: Shonna Chock (July 08, 2009 7:59 AM) CC: Follow-up visit: discuss labs (copy given) and refill Vytorin Comments REVIEWED MED LIST, PATIENT AGREED DOSE AND INSTRUCTION CORRECT   Flu Vaccine Consent Questions     Do you have a history of severe allergic reactions to this vaccine? no    Any prior history of allergic reactions to egg and/or gelatin? no    Do you have a sensitivity to the preservative Thimersol? no    Do you have a past history of Guillan-Barre Syndrome? no    Do you currently have an acute febrile illness? no    Have you ever had a severe reaction to latex? no    Vaccine information given and explained to patient? yes    Are you currently pregnant? no    Lot Number:[id number]   Exp Date:02/17/2010   Site Given right Deltoid IM    CC:  Follow-up visit: discuss labs (copy given) and refill Vytorin.  History of Present Illness: Jordan Mccullough is here to review labs & discuss risks & options. All labs @ goal except glucose 110. A1c is NON Diabetic @ 5.7%. PSA is  stable @ 3.01.His brother has rising PSA ; it rose from 3.7 to 7.4 over past 6 months w/o symptoms. No FH of prostate CA. No FH  premature CAD; + FH HTN in mother, brother. 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 Only finding was PVCs.  Allergies (verified): No Known Drug Allergies  Review of Systems General:  Denies weight loss. Eyes:  Denies blurring, double vision, and vision loss-both eyes; Last Ophth exam 02/13/08 ; no retinopathy. CV:  Denies chest pain or discomfort, leg cramps with exertion, palpitations, and shortness of breath with exertion. GU:  Denies discharge, dysuria, hematuria, incontinence, and urinary  hesitancy. Derm:  Denies poor wound healing; Warts & fungal nail changes addressed by Jordan Mccullough; oral agents declined . Neuro:  Denies numbness and tingling. Endo:  Denies excessive hunger, excessive thirst, and excessive urination.  Physical Exam  General:  Thin ,in no acute distress; alert,appropriate and cooperative throughout examination Lungs:  Normal respiratory effort, chest expands symmetrically. Lungs are clear to auscultation, no crackles or wheezes. Heart:  Normal rate and regular rhythm. S1 and S2 normal without gallop,  click, rub . S4 vs Grade 1/2 systolic  murmur Abdomen:  Bowel sounds positive,abdomen soft and non-tender without masses, organomegaly or hernias noted. Rectal:  No external abnormalities noted. Normal sphincter tone. No rectal masses or tenderness. Prostate:  Prostate gland firm and smooth, ULN  enlargement w/o  nodularity, tenderness, mass, asymmetry or induration. Pulses:  R and L carotid,radial,dorsalis pedis and posterior tibial pulses are full and equal bilaterally Extremities:  No clubbing, cyanosis, edema. Great nais deformed Neurologic:  alert & oriented X3 and sensation intact to light touch over feet.   Skin:  Intact without suspicious lesions or rashes Psych:  memory intact for recent and remote, normally interactive, and good eye contact.     Impression & Recommendations:  Problem # 1:  HYPERLIPIDEMIA (ICD-272.4)  lipids @ goal  His updated  medication list for this problem includes:    Vytorin 10-10 Mg Tabs (Ezetimibe-simvastatin) .Marland Kitchen... 1/2 at bedtime  Problem # 2:  HYPERGLYCEMIA, FASTING (ICD-790.29) No DM   Problem # 3:  MYOCARDIAL INFARCTION, HX OF (ICD-412) No active symptoms; negative Stress Test  Complete Medication List: 1)  Co Q10  2)  Asa 81mg   3)  Multivitamin  4)  Vytorin 10-10 Mg Tabs (Ezetimibe-simvastatin) .... 1/2 at bedtime 5)  Fish Oil 1000 Mg Caps (Omega-3 fatty acids) .Marland Kitchen.. 1 by mouth two times a day  Other Orders: Flu  Vaccine 45yrs + (40981) Administration Flu vaccine - MCR (X9147)  Patient Instructions: 1)  Consume LESS THAN 40 grams of sugar/ day  from packaged foods & drinks (< 10 tsp/day). Monitor brother's prostate condition if possible. Prescriptions: VYTORIN 10-10 MG  TABS (EZETIMIBE-SIMVASTATIN) 1/2 at bedtime  #90 x 1   Entered and Authorized by:   Marga Melnick MD   Signed by:   Marga Melnick MD on 07/08/2009   Method used:   Print then Give to Patient   RxID:   [id number]

## 2010-09-20 NOTE — Letter (Signed)
Summary: Results Follow up Letter  [facility] [place]  [address] [place], [place] [address]   Phone: [phone]  Fax: [phone]    07/05/2007 MRN: [id number]  Jordan Mccullough [address] [place], [place]  [address]  Dear [name],  The following are the results of your recent test(s):  Test         Result    Pap Smear:        Normal _____  Not Normal _____ Comments: ______________________________________________________ Cholesterol: LDL(Bad cholesterol):         Your goal is less than:         HDL (Good cholesterol):       Your goal is more than: Comments:  ______________________________________________________ Mammogram:        Normal _____  Not Normal _____ Comments:  ___________________________________________________________________ Hemoccult:        Normal _____  Not normal _______ Comments:    _____________________________________________________________________ Other Tests:  Please see attached results and comments   We routinely do not discuss normal results over the telephone.  If you desire a copy of the results, or you have any questions about this information we can discuss them at your next office visit.   Sincerely,

## 2010-09-20 NOTE — Assessment & Plan Note (Signed)
Summary: DISCUSS LABS/CDJ   Vital Signs:  Patient Profile:   70 Years Old Male Height:     66 inches Weight:      146 pounds Temp:     98.4 degrees F oral Pulse rate:   80 / minute Resp:     12 per minute BP sitting:   128 / 76  (left arm) Cuff size:   regular  Vitals Entered By: Shonna Chock (May 15, 2008 11:39 AM)                 Chief Complaint:  FOLLOW-UP ON LABS .  History of Present Illness: Special Issuance Evaluation  followup required as per FAA. Negative Exercise Stress Test 04/14/08 by Dr Aleen Campi, Cardilogist(report reviewed). Labs 05/08/08 were  reviewed ; lipids @ goal. FBS 108, but A1c is Non Diabetic @ 5.6%( goal = < 6.5%,ideally < 6.1%). CVE 2X/week as treadmill & swimming almost  daily  w/o symptoms.    Current Allergies (reviewed today): No known allergies      Review of Systems  CV      Denies chest pain or discomfort, leg cramps with exertion, palpitations, and shortness of breath with exertion.  GI      Denies abdominal pain.      No clay colored stool  GU      No dark urine  MS      Denies muscle aches and muscle weakness.   Physical Exam  General:     well-nourished,in no acute distress; alert,appropriate and cooperative throughout examination Lungs:     Normal respiratory effort, chest expands symmetrically. Lungs are clear to auscultation, no crackles or wheezes. Heart:     Normal rate and regular rhythm. S1 and S2 normal without gallop, murmur, click, rub Pulses:     R and L carotid,radial,dorsalis pedis and posterior tibial pulses are full and equal bilaterally Extremities:     No clubbing, cyanosis, edema    Impression & Recommendations:  Problem # 1:  MYOCARDIAL INFARCTION, HX OF (ICD-412)  Problem # 2:  HYPERLIPIDEMIA (ICD-272.4)  His updated medication list for this problem includes:    Vytorin 10-10 Mg Tabs (Ezetimibe-simvastatin) .Marland Kitchen... 1/2 at bedtime   Problem # 3:  HYPERGLYCEMIA, FASTING (ICD-790.29)  A1c normal  Complete Medication List: 1)  Co Q10  2)  Asa 81mg   3)  Multivitamin  4)  Vytorin 10-10 Mg Tabs (Ezetimibe-simvastatin) .... 1/2 at bedtime  Other Orders: Flu Vaccine 7yrs + (09811) Administration Flu vaccine (B1478)   Patient Instructions: 1)  No contraindication to FAA Certification   Prescriptions: VYTORIN 10-10 MG  TABS (EZETIMIBE-SIMVASTATIN) 1/2 at bedtime  #90 x 1   Entered and Authorized by:   Marga Melnick MD   Signed by:   Marga Melnick MD on 05/15/2008   Method used:   Print then Give to Patient   RxID:   [id number]  ] Flu Vaccine Consent Questions     Do you have a history of severe allergic reactions to this vaccine? no    Any prior history of allergic reactions to egg and/or gelatin? no    Do you have a sensitivity to the preservative Thimersol? no    Do you have a past history of Guillan-Barre Syndrome? no    Do you currently have an acute febrile illness? no    Have you ever had a severe reaction to latex? no    Vaccine information given and explained to patient? yes    Are you  currently pregnant? no    Lot Number:[id number]   Site Given  Right Deltoid IM   Chrae Malloy  May 15, 2008 11:52 AM

## 2010-09-20 NOTE — Letter (Signed)
Summary: Results Follow up Letter  [facility] [place]  [address]   [place], [place] [address]   Phone: [phone]  Fax: [phone]    07/05[birth year]2007 MRN: [id number]  Shadeed Usrey [address] [place], [place]  [address]  Dear Mr. Gosdin,  The following are the results of your recent test(s):  Test         Result    Pap Smear:        Normal _____  Not Normal _____ Comments: ______________________________________________________ Cholesterol: LDL(Bad cholesterol):         Your goal is less than:         HDL (Good cholesterol):       Your goal is more than: Comments:  ______________________________________________________ Mammogram:        Normal _____  Not Normal _____ Comments:  ___________________________________________________________________ Hemoccult:        Normal _____  Not normal _______ Comments:    _____________________________________________________________________ Other Tests:  Please see attached results and comments   We routinely do not discuss normal results over the telephone.  If you desire a copy of the results, or you have any questions about this information we can discuss them at your next office visit.   Sincerely,    

## 2010-09-20 NOTE — Letter (Signed)
Summary: Historic Patient File  Historic Patient File   Imported By: Vanessa Swaziland 05/09/2007 11:33:33  _____________________________________________________________________  External Attachment:    Type:   Image     Comment:   External Document  Appended Document: Historic Patient File lab appt scheduled [id number]

## 2010-09-20 NOTE — Progress Notes (Signed)
----   Converted from flag ---- ---- 06/08/2010 5:31 AM, Marga Melnick MD wrote: these are labs needed ------------------------------

## 2010-09-20 NOTE — Progress Notes (Signed)
  Phone Note Call from Patient   Summary of Call: Patient calling stating he was walking his dog this am and walked into a nest of bees and got stung 3 times and he has a proxcedure at 1030am with Dr. Juanda Chance and was wondering if this was going to affect his anesthisia or his procedure. Initial call taken by: Joylene John RN,  March 31, 2010 8:25 AM  Follow-up for Phone Call        Pt denies swelling or difficulty breathing or difficulty swallowing. He states the areas just sting a little. Instructed will not affect the sedation or procedure. He can apply some ice to the areas to help relieve the discomfort. He asked if he could use some "sting cream" and told him that was fine or a touch of hydrocortisone cream if not allergic to that. Pt returned understanding of instructions and states will be here by 0930 am  Follow-up by: Joylene John RN,  March 31, 2010 8:28 AM

## 2010-09-20 NOTE — Letter (Signed)
Summary: Results Follow up Letter   [facility] [place]  [address] [place], [place] [address]   Phone: [phone]  Fax: [phone]    05/20/2007 MRN: [id number]  Jordan Mccullough [address] [place], [place]  [address]  Dear Jordan Mccullough,  The following are the results of your recent test(s):  Test         Result    Pap Smear:        Normal _____  Not Normal _____ Comments: ______________________________________________________ Cholesterol: LDL(Bad cholesterol):         Your goal is less than:         HDL (Good cholesterol):       Your goal is more than: Comments:  ______________________________________________________ Mammogram:        Normal _____  Not Normal _____ Comments:  ___________________________________________________________________ Hemoccult:        Normal _____  Not normal _______ Comments:    _____________________________________________________________________ Other Tests:  Please see attached results and comments   We routinely do not discuss normal results over the telephone.  If you desire a copy of the results, or you have any questions about this information we can discuss them at your next office visit.   Sincerely,

## 2010-09-20 NOTE — Assessment & Plan Note (Signed)
Summary: TO TALK ABOUT HIS FLIGHT CPX   PH   Vital Signs:  Patient Profile:   70 Years Old Male Height:     66 inches Weight:      153 pounds Pulse rate:   56 / minute Pulse rhythm:   regular BP sitting:   112 / 70  (left arm) Cuff size:   large  Vitals Entered By: Wendall Stade (July 02, 2007 1:24 PM)                 Chief Complaint:  to discuss problem with faa form.  History of Present Illness: His FBS in 2007 was 108, 104 in 2006 & 115 in 2005; no PMH or FH of DM.   Current Allergies (reviewed today): No known allergies      Review of Systems  Endo      Denies excessive hunger, excessive thirst, excessive urination, polyuria, and weight change.   Physical Exam  General:     Well-developed,well-nourished,in no acute distress; alert,appropriate and cooperative throughout examination Heart:     normal rate, regular rhythm, no gallop, no rub, no JVD, and grade 1 /6 systolic murmur.   Pulses:     R and L carotid,radial,dorsalis pedis and posterior tibial pulses are full and equal bilaterally    Impression & Recommendations:  Problem # 1:  OTHER ABNORMAL BLOOD CHEMISTRY (ICD-790.6)  Orders: TLB-Microalbumin/Creat Ratio, Urine (82043-MALB) TLB-A1C / Hgb A1C (Glycohemoglobin) (83036-A1C)   Complete Medication List: 1)  Co Q10  2)  Asa 81mg   3)  Multivitamin  4)  Vytorin 10-10 Mg Tabs (Ezetimibe-simvastatin) .Marland Kitchen.. 1 by mouth qd     ]

## 2010-09-20 NOTE — Procedures (Signed)
Summary: Colonoscopy  Patient: Jordan Mccullough Note: All result statuses are Final unless otherwise noted.  Tests: (1) Colonoscopy (COL)   COL Colonoscopy           DONE     [facility]     [address]     [place], [place]  [address]           COLONOSCOPY PROCEDURE REPORT           PATIENT:  [name], [name]  MR#:  [id number]     BIRTHDATE:  12-Feb-1941, 69 yrs. old  GENDER:  male     ENDOSCOPIST:  Hedwig Morton. Juanda Chance, MD     REF. BY:  Janace Hoard, M.D.     PROCEDURE DATE:  03/31/2010     PROCEDURE:  Colonoscopy 19147     ASA CLASS:  Class I     INDICATIONS:  Routine Risk Screening     MEDICATIONS:   Versed 10 mg, Fentanyl 100 mcg           DESCRIPTION OF PROCEDURE:   After the risks benefits and     alternatives of the procedure were thoroughly explained, informed     consent was obtained.  Digital rectal exam was performed and     revealed no rectal masses.   The [id number] endoscope was     introduced through the anus and advanced to the cecum, which was     identified by both the appendix and ileocecal valve, without     limitations.  The quality of the prep was good, using MiraLax.     The instrument was then slowly withdrawn as the colon was fully     examined.     <<PROCEDUREIMAGES>>     FINDINGS:  Mild diverticulosis was found in the sigmoid colon (see     image5 and image6).  This was otherwise a normal examination of     the colon (see image7, image4, image3, and image2).   Retroflexed     views in the rectum revealed no abnormalities.    The sco     pe was then withdrawn from the patient and the procedure     completed.           COMPLICATIONS:  None     ENDOSCOPIC IMPRESSION:     1) Mild diverticulosis in the sigmoid colon     2) Otherwise normal examination     RECOMMENDATIONS:     1) high fiber diet     REPEAT EXAM:  In 10 year(s) for.           ______________________________     Hedwig Morton. Juanda Chance, MD           CC:           n.     eSIGNED:   Hedwig Morton. Chele Cornell at 03/31/2010 11:24 AM           Jenene Slicker, [phone]  Note: An exclamation mark (!) indicates a result that was not dispersed into the flowsheet. Document Creation Date: 03/31/2010 11:25 AM _______________________________________________________________________  (1) Order result status: Final Collection or observation date-time: 03/31/2010 11:14 Requested date-time:  Receipt date-time:  Reported date-time:  Referring Physician:   Ordering Physician: Lina Sar [phone]) Specimen Source:  Source: Launa Grill Order Number: [id number] Lab site:   Appended Document: Colonoscopy    Clinical Lists Changes  Observations: Added new observation of COLONNXTDUE: 03/2020 (03/31/2010 13:21)

## 2010-09-20 NOTE — Progress Notes (Signed)
Summary: REFILL REQUEST  Phone Note Refill Request Call back at [phone] Message from:  Pharmacy on May 06, 2010 1:23 PM  Refills Requested: Medication #1:  SIMVASTATIN 20 MG TABS take 1 tab at bedtime   Dosage confirmed as above?Dosage Confirmed   Supply Requested: 3 months   Last Refilled: 12/23/2009 [facility] [phone]  Next Appointment Scheduled: NONE Initial call taken by: Lavell Islam,  May 06, 2010 1:24 PM  Follow-up for Phone Call        RX was faxed to [phone]  Follow-up by: Shonna Chock CMA,  May 06, 2010 1:25 PM    Prescriptions: SIMVASTATIN 20 MG TABS (SIMVASTATIN) take 1 tab at bedtime  #30 x 0   Entered by:   Shonna Chock CMA   Authorized by:   Marga Melnick MD   Signed by:   Shonna Chock CMA on 05/06/2010   Method used:   Print then Give to Patient   RxID:   [id number]

## 2010-09-22 NOTE — Letter (Signed)
Summary: Letter Regarding Medical Certificate/FAA  Letter Regarding Medical Certificate/FAA   Imported By: Lanelle Bal 09/16/2010 14:00:16  _____________________________________________________________________  External Attachment:    Type:   Image     Comment:   External Document

## 2011-01-06 NOTE — Op Note (Signed)
NAME:  Jordan Mccullough, Jordan Mccullough                          ACCOUNT NO.:  [id number]   MEDICAL RECORD NO.:  [id number]                   PATIENT TYPE:  AMB   LOCATION:  ENDO                                 FACILITY:  [facility]   PHYSICIAN:  Georgiana Spinner, M.D.                 DATE OF BIRTH:  1940-11-16   DATE OF PROCEDURE:  DATE OF DISCHARGE:                                 OPERATIVE REPORT   PROCEDURE:  Colonoscopy.   INDICATIONS:  Rectal bleeding, colon polyp.   ANESTHESIA:  Demerol 75, Versed 7.5 mg.   PROCEDURE:  With the patient mildly sedated in the left lateral decubitus  position, the Olympus videoscopic colonoscope was inserted in the rectum and  passed under direct vision to the cecum with pressure applied to the  abdomen.  The cecum was identified by ileocecal valve and appendiceal  orifice, both of which were photographed.  From this point, the colonoscope  was slowly withdrawn, taking circumferential views of the colonic mucosa,  stopped in the splenic flexure area which is where a polyp was seen.  It was  certainly larger than 1 cm in size and it was removed in piecemeal fashion  with snare cautery technique and also hot biopsy forceps technique.  There  was an adjacent small polyp that was also removed with hot biopsy forceps  technique.  There was some diverticulosis seen in the sigmoid colon as we  withdrew all the way to the rectum, which appeared normal in the rectum  retroflex view.  The endoscope was straightened and withdrawn.   The patient's vital signs and pulses had remained stable.  The patient  tolerated the procedure well without apparent complication.   FINDINGS:  Polyp, as noted, in what appeared to be splenic flexure area,  approximately 80 cm from the anal verge.   PLAN:  Will get flat and upright of the abdomen to make sure there has been no  perforation because of the size of the polyp that was removed.  Otherwise  will have patient on a low residue diet and  avoidance of aspirin.                                               Georgiana Spinner, M.D.    GMO/MEDQ  D:  07/21/2003  T:  07/21/2003  Job:  [id number]

## 2011-05-02 ENCOUNTER — Other Ambulatory Visit: Payer: Self-pay | Admitting: Internal Medicine

## 2011-05-02 NOTE — Telephone Encounter (Signed)
Patient needs to schedule CPX with fasting labs  

## 2011-07-06 ENCOUNTER — Telehealth: Payer: Self-pay

## 2011-07-06 DIAGNOSIS — I2510 Atherosclerotic heart disease of native coronary artery without angina pectoris: Secondary | ICD-10-CM

## 2011-07-06 NOTE — Telephone Encounter (Signed)
Message copied by Edgardo Roys on Thu Jul 06, 2011  3:58 PM ------      Message from: Pecola Lawless      Created: Tue Jul 04, 2011 10:53 AM       Please verify from the old chart whether he has seen a specific  cardiologist for the prior cardiovascular evaluations. He wants to make sure that his insurance will cover the stress test in the absence of symptoms. Medicare would not cover such.

## 2011-07-06 NOTE — Telephone Encounter (Signed)
Spoke with patient, patient's cardiologist was Dr.Tysinger (retired), patient states if [employer] does not cover he will pay for test.  Dr.Hopper spoke with patient and informed him that it is best for patient to have referral to a Astronomer, patient ok'd. (Referral placed)

## 2011-07-31 ENCOUNTER — Ambulatory Visit (INDEPENDENT_AMBULATORY_CARE_PROVIDER_SITE_OTHER): Payer: Medicare Other | Admitting: Cardiovascular Disease

## 2011-07-31 ENCOUNTER — Encounter: Payer: Self-pay | Admitting: Cardiovascular Disease

## 2011-07-31 VITALS — BP 150/90 | HR 78 | Ht 63.0 in | Wt 143.0 lb

## 2011-07-31 DIAGNOSIS — E785 Hyperlipidemia, unspecified: Secondary | ICD-10-CM

## 2011-07-31 DIAGNOSIS — R0600 Dyspnea, unspecified: Secondary | ICD-10-CM

## 2011-07-31 DIAGNOSIS — I2510 Atherosclerotic heart disease of native coronary artery without angina pectoris: Secondary | ICD-10-CM

## 2011-07-31 NOTE — Assessment & Plan Note (Signed)
He is known to have CAD with no cath in 20 years. Recent dyspnea, fatigue. Will arrange exercise stress myoview. I will call him with the results. If abnormal, we will plan cath.

## 2011-07-31 NOTE — Progress Notes (Signed)
   History of Present Illness: [name] with history of CAD s/p MI in 1992 with PTCA, HLD here today to establish cardiology care. He had been followed in the past by Dr. Aleen Mccullough. He had an MI in 1992 and had balloon angioplasty. He had a exercise stress test in November 2011 that showed no ischemia. He notes some fatigue and SOB. No chest pains. This seems to be a change in his overall functional level. 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   His primary care is Dr. Alwyn Mccullough.   Past Medical History  Diagnosis Date  . Hyperlipidemia   . Diverticulosis   . Coronary artery disease     MI 1992, s/p PTCA.     Past Surgical History  Procedure Date  . Ptca     1992  . Inguinal heriiorrhaphy bilaterally   . Colonoscopy w/ polypectomy   . Pilonidal cyst excision   . Rotator cuff repair     Bilateral    Current Outpatient Prescriptions  Medication Sig Dispense Refill  . aspirin 81 MG tablet Take 81 mg by mouth daily.        . Coenzyme Q10 (CO Q 10) 10 MG CAPS Take by mouth.        . Multiple Vitamin (MULTIVITAMIN) tablet Take 1 tablet by mouth daily.        . simvastatin (ZOCOR) 20 MG tablet TAKE 1 TABLET BY MOUTH AT BEDTIME  90 tablet  0    No Known Allergies  History   Social History  . Marital Status: Married    Spouse Name: N/A    Number of Children: N/A  . Years of Education: N/A   Occupational History  . Retired Dentist    Social History Main Topics  . Smoking status: Former Smoker -- 2.0 packs/day for 30 years    Types: Cigarettes  . Smokeless tobacco: Not on file   Comment: QUIT 15 YEARS AGO  . Alcohol Use: 5.0 oz/week    10 drink(s) per week  . Drug Use: No  . Sexually Active: Not on file   Other Topics Concern  . Not on file   Social History Narrative   HEART HEALTHY DIETRETIREDMARRIEDFORMER SMOKER QUIT 1992ALCOHOL USE -YES- RED WINE SOCIALLYSMOKING -QUIT    Family History    Problem Relation Age of Onset  . Hypertension Mother   . Hypertension Mother     Review of Systems:  As stated in the HPI and otherwise negative.   BP 150/90  Pulse 78  Ht 5\' 3"  (1.6 m)  Wt 143 lb (64.864 kg)  BMI 25.33 kg/m2  Physical Examination: General: Well developed, well nourished, NAD HEENT: OP clear, mucus membranes moist SKIN: warm, dry. No rashes. Neuro: No focal deficits Musculoskeletal: Muscle strength 5/5 all ext Psychiatric: Mood and affect normal Neck: No JVD, no carotid bruits, no thyromegaly, no lymphadenopathy. Lungs:Clear bilaterally, no wheezes, rhonci, crackles Cardiovascular: Regular rate and rhythm. No murmurs, gallops or rubs. Abdomen:Soft. Bowel sounds present. Non-tender.  Extremities: No lower extremity edema. Pulses are 2 + in the bilateral DP/PT.  EKG:NSR, rate 77 bpm. LAFB.

## 2011-07-31 NOTE — Patient Instructions (Signed)
Your physician wants you to follow-up in:12 months.  You will receive a reminder letter in the mail two months in advance. If you don't receive a letter, please call our office to schedule the follow-up appointment.  Your physician has requested that you have en exercise stress myoview. For further information please visit [email or web address]. Please follow instruction sheet, as given.  

## 2011-07-31 NOTE — Assessment & Plan Note (Signed)
Continue statin. Lipids followed in primary care.

## 2011-08-02 ENCOUNTER — Other Ambulatory Visit: Payer: Self-pay | Admitting: Internal Medicine

## 2011-08-02 NOTE — Telephone Encounter (Signed)
Patient needs to schedule appointment for CPX and fasting labs

## 2011-08-03 ENCOUNTER — Ambulatory Visit (HOSPITAL_COMMUNITY): Payer: Medicare Other | Attending: Cardiovascular Disease | Admitting: Radiology

## 2011-08-03 DIAGNOSIS — I2510 Atherosclerotic heart disease of native coronary artery without angina pectoris: Secondary | ICD-10-CM | POA: Insufficient documentation

## 2011-08-03 DIAGNOSIS — I252 Old myocardial infarction: Secondary | ICD-10-CM | POA: Insufficient documentation

## 2011-08-03 DIAGNOSIS — E785 Hyperlipidemia, unspecified: Secondary | ICD-10-CM | POA: Insufficient documentation

## 2011-08-03 DIAGNOSIS — R0600 Dyspnea, unspecified: Secondary | ICD-10-CM

## 2011-08-03 DIAGNOSIS — Z87891 Personal history of nicotine dependence: Secondary | ICD-10-CM | POA: Insufficient documentation

## 2011-08-03 DIAGNOSIS — R0989 Other specified symptoms and signs involving the circulatory and respiratory systems: Secondary | ICD-10-CM | POA: Insufficient documentation

## 2011-08-03 DIAGNOSIS — R5381 Other malaise: Secondary | ICD-10-CM | POA: Insufficient documentation

## 2011-08-03 DIAGNOSIS — R0609 Other forms of dyspnea: Secondary | ICD-10-CM | POA: Insufficient documentation

## 2011-08-03 MED ORDER — TECHNETIUM TC 99M TETROFOSMIN IV KIT
10.0000 | PACK | Freq: Once | INTRAVENOUS | Status: AC | PRN
  Administered 2011-08-03: 10 via INTRAVENOUS

## 2011-08-03 MED ORDER — TECHNETIUM TC 99M TETROFOSMIN IV KIT
30.0000 | PACK | Freq: Once | INTRAVENOUS | Status: AC | PRN
  Administered 2011-08-03: 30 via INTRAVENOUS

## 2011-08-03 NOTE — Progress Notes (Signed)
[facility] SITE 3 NUCLEAR MED [address] [place] [address] [phone]  Cardiology Nuclear Med Study  Jordan Mccullough is a 70 y.o. male [id number] 03/19/1941   Nuclear Med Background Indication for Stress Test:  Evaluation for Ischemia and PTCA Patency History:  '92 MI>PTCA-?; '11 MPS:No ischemia Cardiac Risk Factors: History of Smoking and Lipids  Symptoms:  Fatigue   Nuclear Pre-Procedure Caffeine/Decaff Intake:  None NPO After: 6:30am   Lungs:  Clear. IV 0.9% NS with Angio Cath:  20g  IV Site: R Antecubital  IV Started by:  Stanton Kidney, EMT-P  Chest Size (in):  38 Cup Size: n/a  Height: 5\' 3"  (1.6 m)  Weight:  139 lb (63.05 kg)  BMI:  Body mass index is 24.62 kg/(m^2). Tech Comments:  NA    Nuclear Med Study 1 or 2 day study: 1 day  Stress Test Type:  Stress  Reading MD: Charlton Haws, MD  Order Authorizing Provider:  Verne Carrow, MD  Resting Radionuclide: Technetium 6m Tetrofosmin  Resting Radionuclide Dose: 10.8 mCi   Stress Radionuclide:  Technetium 6m Tetrofosmin  Stress Radionuclide Dose: 33.0 mCi           Stress Protocol Rest HR: 81 Stress HR: 171  Rest BP: Sitting:151/91  Standing:148/101 Stress BP: 195/102  193/106  Exercise Time (min): 9:00 METS: 10.1   Predicted Max HR: 151 bpm % Max HR: 113.25 bpm Rate Pressure Product: 46962   Dose of Adenosine (mg):  n/a Dose of Lexiscan: n/a mg  Dose of Atropine (mg): n/a Dose of Dobutamine: n/a mcg/kg/min (at max HR)  Stress Test Technologist: Smiley Houseman, CMA-N  Nuclear Technologist:  Domenic Polite, CNMT     Rest Procedure:  Myocardial perfusion imaging was performed at rest 45 minutes following the intravenous administration of Technetium 75m Tetrofosmin.  Rest ECG: No acute changes, occasional PAC's and rare PVC.  Stress Procedure:  The patient exercised for nine minutes on the treadmill utilizing the Bruce protocol.  The patient stopped due to a hypertensive  response and denied any chest pain.  There were no diagnostic ST-T wave changes.  There were occasional PVC's with couplets and occasional PAC's.  He did have a hypertensive response to exercise, 193/106 and 195/102.  Technetium 5m Tetrofosmin was injected at peak exercise and myocardial perfusion imaging was performed after a brief delay.  Stress ECG: No significant change from baseline ECG  QPS Raw Data Images:  Normal; no motion artifact; normal heart/lung ratio. Stress Images:  Normal homogeneous uptake in all areas of the myocardium. Rest Images:  Normal homogeneous uptake in all areas of the myocardium. Subtraction (SDS):  Normal Transient Ischemic Dilatation (Normal <1.22):  1.00 Lung/Heart Ratio (Normal <0.45):  0.31  Quantitative Gated Spect Images QGS EDV:  133 ml QGS ESV:  73 ml QGS cine images:  Diffuse hypokinesis EF 45% QGS EF: 45%  Impression Exercise Capacity:  Good exercise capacity. BP Response:  Hypertensive blood pressure response. Clinical Symptoms:  No chest pain. ECG Impression:  No significant ST segment change suggestive of ischemia. Comparison with Prior Nuclear Study: No images to compare  Overall Impression:  Low risk stress nuclear study. Diaphragmatic attenuation.  No ischemia.  Diffuse hypokinesis EF 45%   Charlton Haws

## 2011-08-07 ENCOUNTER — Telehealth: Payer: Self-pay

## 2011-08-07 NOTE — Telephone Encounter (Signed)
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 Patient states he needs a blood sugar level, PSA, A1c,Lipid   Dr.Hopper please advise on the code to use for PSA, I have codes to place with other labs (appointment schedule for Wed)

## 2011-08-08 ENCOUNTER — Other Ambulatory Visit: Payer: Self-pay | Admitting: Internal Medicine

## 2011-08-08 DIAGNOSIS — R7309 Other abnormal glucose: Secondary | ICD-10-CM

## 2011-08-08 DIAGNOSIS — T887XXA Unspecified adverse effect of drug or medicament, initial encounter: Secondary | ICD-10-CM

## 2011-08-08 DIAGNOSIS — Z0000 Encounter for general adult medical examination without abnormal findings: Secondary | ICD-10-CM

## 2011-08-08 DIAGNOSIS — E785 Hyperlipidemia, unspecified: Secondary | ICD-10-CM

## 2011-08-08 NOTE — Telephone Encounter (Signed)
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 Medicare would not cover this usually; his secondary policy probably will. Other diagnoses include coronary artery disease, status post MI, 211.3. Appt 2-3 days after fasting labs .Please  schedule fasting Labs : BMET,Lipids, hepatic panel, CBC & dif, TSH, PSA.

## 2011-08-08 NOTE — Telephone Encounter (Signed)
Phone note printed off and given to Lab [facility]

## 2011-08-09 ENCOUNTER — Other Ambulatory Visit (INDEPENDENT_AMBULATORY_CARE_PROVIDER_SITE_OTHER): Payer: Medicare Other

## 2011-08-09 DIAGNOSIS — E785 Hyperlipidemia, unspecified: Secondary | ICD-10-CM

## 2011-08-09 DIAGNOSIS — Z0000 Encounter for general adult medical examination without abnormal findings: Secondary | ICD-10-CM

## 2011-08-09 DIAGNOSIS — T887XXA Unspecified adverse effect of drug or medicament, initial encounter: Secondary | ICD-10-CM

## 2011-08-09 DIAGNOSIS — R7309 Other abnormal glucose: Secondary | ICD-10-CM

## 2011-08-09 DIAGNOSIS — Z125 Encounter for screening for malignant neoplasm of prostate: Secondary | ICD-10-CM

## 2011-08-09 LAB — LIPID PANEL
Cholesterol: 165 mg/dL (ref 0–200)
HDL: 53.5 mg/dL (ref >39.00)
LDL Cholesterol: 101 mg/dL — ABNORMAL HIGH (ref 0–99)
Total CHOL/HDL Ratio: 3
Triglycerides: 52 mg/dL (ref 0.0–149.0)
VLDL: 10.4 mg/dL (ref 0.0–40.0)

## 2011-08-09 LAB — CBC WITH DIFFERENTIAL/PLATELET
Basophils Absolute: 0 10*3/uL (ref 0.0–0.1)
Basophils Relative: 0.5 % (ref 0.0–3.0)
Eosinophils Absolute: 0.2 10*3/uL (ref 0.0–0.7)
Eosinophils Relative: 2.8 % (ref 0.0–5.0)
HCT: 45.5 % (ref 39.0–52.0)
Hemoglobin: 15.6 g/dL (ref 13.0–17.0)
Lymphocytes Relative: 22.5 % (ref 12.0–46.0)
Lymphs Abs: 1.8 10*3/uL (ref 0.7–4.0)
MCHC: 34.3 g/dL (ref 30.0–36.0)
MCV: 94.4 fl (ref 78.0–100.0)
Monocytes Absolute: 0.5 10*3/uL (ref 0.1–1.0)
Monocytes Relative: 6.6 % (ref 3.0–12.0)
Neutro Abs: 5.4 10*3/uL (ref 1.4–7.7)
Neutrophils Relative %: 67.6 % (ref 43.0–77.0)
Platelets: 272 10*3/uL (ref 150.0–400.0)
RBC: 4.81 Mil/uL (ref 4.22–5.81)
RDW: 14.8 % — ABNORMAL HIGH (ref 11.5–14.6)
WBC: 8.1 10*3/uL (ref 4.5–10.5)

## 2011-08-09 LAB — BASIC METABOLIC PANEL
BUN: 17 mg/dL (ref 6–23)
CO2: 27 mEq/L (ref 19–32)
Calcium: 9.3 mg/dL (ref 8.4–10.5)
Chloride: 105 mEq/L (ref 96–112)
Creatinine, Ser: 0.8 mg/dL (ref 0.4–1.5)
GFR: 103.2 mL/min (ref >60.00)
Glucose, Bld: 107 mg/dL — ABNORMAL HIGH (ref 70–99)
Potassium: 4 mEq/L (ref 3.5–5.1)
Sodium: 140 mEq/L (ref 135–145)

## 2011-08-09 LAB — HEPATIC FUNCTION PANEL
ALT: 27 U/L (ref 0–53)
AST: 31 U/L (ref 0–37)
Albumin: 4 g/dL (ref 3.5–5.2)
Alkaline Phosphatase: 43 U/L (ref 39–117)
Bilirubin, Direct: 0.1 mg/dL (ref 0.0–0.3)
Total Bilirubin: 0.5 mg/dL (ref 0.3–1.2)
Total Protein: 6.8 g/dL (ref 6.0–8.3)

## 2011-08-09 LAB — HEMOGLOBIN A1C: Hgb A1c MFr Bld: 5.5 % (ref 4.6–6.5)

## 2011-08-09 LAB — PSA: PSA: 4.43 ng/mL — ABNORMAL HIGH (ref 0.10–4.00)

## 2011-08-09 LAB — TSH: TSH: 1.2 u[IU]/mL (ref 0.35–5.50)

## 2011-08-11 ENCOUNTER — Encounter: Payer: Self-pay | Admitting: Internal Medicine

## 2011-08-11 ENCOUNTER — Ambulatory Visit (INDEPENDENT_AMBULATORY_CARE_PROVIDER_SITE_OTHER): Payer: Medicare Other | Admitting: Internal Medicine

## 2011-08-11 DIAGNOSIS — R972 Elevated prostate specific antigen [PSA]: Secondary | ICD-10-CM

## 2011-08-11 DIAGNOSIS — E785 Hyperlipidemia, unspecified: Secondary | ICD-10-CM

## 2011-08-11 DIAGNOSIS — R7309 Other abnormal glucose: Secondary | ICD-10-CM

## 2011-08-11 DIAGNOSIS — H9190 Unspecified hearing loss, unspecified ear: Secondary | ICD-10-CM

## 2011-08-11 DIAGNOSIS — I252 Old myocardial infarction: Secondary | ICD-10-CM

## 2011-08-11 NOTE — Patient Instructions (Signed)
Please complete the Audiology evaluation to assess possible hearing loss.

## 2011-08-11 NOTE — Assessment & Plan Note (Signed)
Nuclear medicine stress test 08/03/11 revealed no ischemic changes or EKG changes.

## 2011-08-11 NOTE — Assessment & Plan Note (Signed)
Fasting blood sugar was mildly elevated at 107 on 08/03/11. A1c is in the nondiabetic range at 5.5%

## 2011-08-11 NOTE — Progress Notes (Signed)
Subjective:    Patient ID: Jordan Mccullough, male    DOB: [birth year], 70 y.o.   MRN: [id number]  HPI  Jordan Mccullough  is here for a FAA  Physical; he denies acute issues. As per FAA requirements he has completed an extensive cardiovascular evaluation including a nuclear stress study 12/13. Exercise testing was good. He did exhibit a hypertensive blood pressure response. He had no symptoms and no EKG changes to suggest ischemia. This was felt to be at low risk study. Cardiology evaluation 07/31/11 was reviewed. Because of some dyspnea and fatigue the exercise stress Myoview was scheduled with the results as noted above.  Ophthalmologic exam to include field of vision  testing has been completed. Those results are to be forwarded to the [facility]  as well   Review of Systems He has minor upper respiratory tract symptoms with some rhinitis. He denies symptoms of rhinosinusitis such as fever, frontal headache, facial pain, or nasal purulence. Patient reports no   hearing changes,anorexia, weight change, adenopathy, persistant / recurrent hoarseness, swallowing issues, chest pain,palpitations, edema,persistant / recurrent cough, hemoptysis, dyspnea(rest, exertional, paroxysmal nocturnal), gastrointestinal  bleeding (melena, rectal bleeding), abdominal pain, excessive heart burn, GU symptoms( dysuria, hematuria, pyuria, voiding/incontinence  issues) syncope, focal weakness, memory loss,numbness & tingling, skin/hair/nail changes,depression, anxiety, abnormal bruising/bleeding,or  musculoskeletal symptoms/signs. He walks 2 to 3 miles per day with no symptoms.     Objective:   Physical Exam Gen.: Thin but healthy and well-nourished in appearance. Alert, appropriate and cooperative throughout exam. Head: Normocephalic without obvious abnormalities;  no alopecia . Beard & moustache Eyes: No corneal or conjunctival inflammation noted.  Extraocular motion intact. Vision & FOV completed by Ophthalmlogist. Ears: External   ear exam reveals no significant lesions or deformities. Canals clear .TMs normal. Hearing is grossly decreased  to whisper @ 6 ft on L. Nose: External nasal exam reveals no deformity or inflammation. Nasal mucosa are pink and moist. No lesions or exudates noted.   Mouth: Oral mucosa and oropharynx reveal no lesions or exudates. Teeth in good repair. Neck: No deformities, masses, or tenderness noted. Range of motion & Thyroid normal. Lungs: Normal respiratory effort; chest expands symmetrically. Lungs are clear to auscultation without rales, wheezes, or increased work of breathing. Heart: Normal rate and rhythm. Normal S1 and S2. No gallop, click, or rub. Grade 1/ 6 systolic base to apex murmur. Abdomen: Bowel sounds normal; abdomen soft and nontender. No masses, organomegaly or hernias noted. Genitalia/ DRE: There is slight weakness in the left inguinal canal  without fixed herniation. Prostate is upper limits of normal but soft without induration, asymmetry, or nodularity.                                                                                Musculoskeletal/extremities: No deformity or scoliosis noted of  the thoracic or lumbar spine. No clubbing, cyanosis, edema, or deformity noted. Range of motion  normal .Tone & strength  normal.Joints normal. Nail health  good. Vascular: Carotid, radial artery, dorsalis pedis and  posterior tibial pulses are full and equal. No bruits present. Neurologic: Alert and oriented x3. Deep tendon reflexes symmetrical and normal. Gait, Romberg testing and finger to nose  were normal.         Skin: Intact without suspicious lesions or rashes. Faint operative scars are present in the supragluteal area. Lymph: No cervical, axillary, or inguinal lymphadenopathy present. Psych: Mood and affect are normal. Normally interactive                                                                                         Assessment & Plan:  #1 comprehensive physical  exam; no acute findings #2 see Problem List with Assessments & Recommendations  #3 mildly elevated PSA at 4.43. At his age this would not be considered significant. Velocity of rise compared to the value of 3.77 on 06/28/10 is not significant as it is  not greater than 0.75 per year. The prostate is upper limits of normal without significant enlargement. 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 Audiology evaluation is indicated.   Plan: see Orders

## 2011-08-11 NOTE — Assessment & Plan Note (Signed)
Lipids 08/09/11 revealed  calculated LDL of 101 and HDL of 53.5.

## 2011-08-30 ENCOUNTER — Ambulatory Visit: Payer: Medicare Other | Admitting: Audiology

## 2011-08-30 ENCOUNTER — Ambulatory Visit: Payer: Medicare Other | Attending: Internal Medicine | Admitting: Audiology

## 2011-08-30 DIAGNOSIS — H903 Sensorineural hearing loss, bilateral: Secondary | ICD-10-CM | POA: Insufficient documentation

## 2011-10-30 ENCOUNTER — Other Ambulatory Visit: Payer: Self-pay | Admitting: Internal Medicine

## 2011-10-31 NOTE — Telephone Encounter (Signed)
Prescription sent to pharmacy.

## 2012-02-12 ENCOUNTER — Encounter: Payer: Self-pay | Admitting: Internal Medicine

## 2012-06-07 ENCOUNTER — Telehealth: Payer: Self-pay | Admitting: Cardiovascular Disease

## 2012-06-07 DIAGNOSIS — I2510 Atherosclerotic heart disease of native coronary artery without angina pectoris: Secondary | ICD-10-CM

## 2012-06-07 NOTE — Telephone Encounter (Signed)
Spoke with pt and reviewed stress test instructions. He needs to have this done in November. I will have schedulers contact him with appt time and also schedule year follow up with Dr. Clifton James for December

## 2012-06-07 NOTE — Telephone Encounter (Signed)
OK to arrange stress test, POET. cdm

## 2012-06-07 NOTE — Telephone Encounter (Signed)
Pt received letter for hi s1 year fu appt due in December, he says he needs a treadmill test, does dr Clifton James want to order this?pls call [phone]

## 2012-06-07 NOTE — Telephone Encounter (Signed)
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  Should we do prior to yearly follow up in December?

## 2012-06-20 ENCOUNTER — Telehealth: Payer: Self-pay | Admitting: Internal Medicine

## 2012-06-20 DIAGNOSIS — R7309 Other abnormal glucose: Secondary | ICD-10-CM

## 2012-06-20 DIAGNOSIS — R972 Elevated prostate specific antigen [PSA]: Secondary | ICD-10-CM

## 2012-06-20 DIAGNOSIS — E785 Hyperlipidemia, unspecified: Secondary | ICD-10-CM

## 2012-06-20 DIAGNOSIS — T887XXA Unspecified adverse effect of drug or medicament, initial encounter: Secondary | ICD-10-CM

## 2012-06-20 NOTE — Telephone Encounter (Signed)
Lipid/Hep 272.4/995.20 BMP 790.29  This would be the only labs that I can see patient would be able to get with out CPX diagnosis, Dr.Hopper please advise

## 2012-06-20 NOTE — Telephone Encounter (Signed)
Patient states he is due for labs. He is scheduled for labs and f/u in November but I need orders. He is aware Dr. Alwyn Ren is no longer performing FAA physicals.

## 2012-06-23 NOTE — Telephone Encounter (Signed)
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 Dr Cleta Alberts is extremely competent & has access to data in [facility] EMR.

## 2012-06-24 ENCOUNTER — Telehealth: Payer: Self-pay | Admitting: Internal Medicine

## 2012-06-24 NOTE — Telephone Encounter (Signed)
retunred your call re: FAA & up coming appts Pt will call Dr.Daubs to schedule CPE, wants to get labs done here and keep upcoming appt., so he can go over results/treadmill test with Hopp.

## 2012-06-24 NOTE — Telephone Encounter (Signed)
Add PSA ; elevated PSA

## 2012-06-24 NOTE — Telephone Encounter (Signed)
Spoke w/pt. I have already advised him to contact Dr. Cleta Alberts to schedule FAA physical. Pt will come here for labs and f/u appt.

## 2012-06-24 NOTE — Telephone Encounter (Signed)
Orders placed.

## 2012-06-25 ENCOUNTER — Ambulatory Visit (INDEPENDENT_AMBULATORY_CARE_PROVIDER_SITE_OTHER): Payer: Medicare Other | Admitting: Nurse Practitioner

## 2012-06-25 ENCOUNTER — Encounter: Payer: Self-pay | Admitting: Nurse Practitioner

## 2012-06-25 VITALS — BP 153/81 | HR 82

## 2012-06-25 DIAGNOSIS — I2510 Atherosclerotic heart disease of native coronary artery without angina pectoris: Secondary | ICD-10-CM

## 2012-06-25 DIAGNOSIS — I259 Chronic ischemic heart disease, unspecified: Secondary | ICD-10-CM

## 2012-06-25 NOTE — Patient Instructions (Addendum)
The current medical regimen is effective;  continue present plan and medications.  Your physician has requested that you have an echocardiogram. Echocardiography is a painless test that uses sound waves to create images of your heart. It provides your doctor with information about the size and shape of your heart and how well your heart's chambers and valves are working. This procedure takes approximately one hour. There are no restrictions for this procedure.  Follow up as scheduled 

## 2012-06-25 NOTE — Progress Notes (Signed)
Exercise Treadmill Test  Pre-Exercise Testing Evaluation Rhythm: Sinus Rhythm with PVC  Rate: 82   PR:  .19 QRS:  .09  QT:  .37 QTc: .43           Test  Exercise Tolerance Test Ordering MD: Melene Muller, MD  Interpreting MD: Norma Fredrickson, NP  Unique Test No: 1  Treadmill:  1  Indication for ETT: R/O ischemia  Contraindication to ETT: No   Stress Modality: exercise - treadmill  Cardiac Imaging Performed: non   Protocol: standard Bruce - maximal  Max BP:  198/93  Max MPHR (bpm):  150 85% MPR (bpm):  128  MPHR obtained (bpm):  176 MPHR % 117%  Reached 85% MPHR (min:sec):  4:00 Total Exercise Time (min-sec):  9:00  Workload in METS:  10.1 Borg Scale:13  Reason ETT Terminated:  desired heart rate attained    ST Segment Analysis At Rest: normal ST segments - no evidence of significant ST depression With Exercise: no evidence of significant ST depression  Other Information Arrhythmia:  Yes Angina during ETT:  absent (0) Quality of ETT:  diagnostic  ETT Interpretation:  normal - no evidence of ischemia by ST analysis  Comments: Patient presents today for routine GXT. Has known CAD with remote MI/PTCA. Has been doing well clinically with no complaints. Denies chest pain, dyspnea, palpitations, dizziness or syncope.  He exercised on the standard Bruce protocol for a total of 9 minutes today on the standard Bruce protocol. He has good exercise tolerance. He achieved over 100% of his predicted heart rate. The test was stopped due to fatigue. No angina reported. Blood pressure response was adequate. EKG was without ischemia. The patient did have frequent PVCs, bigeminy, couplets as well as 3 beat runs with exercise. He was asymptomatic.   Recommendations: Will check an echo to rule out LV dysfunction.  He has follow up with Dr. Clifton James next month.   Patient is agreeable to this plan and will call if any problems develop in the interim.

## 2012-07-02 ENCOUNTER — Other Ambulatory Visit (HOSPITAL_COMMUNITY): Payer: Medicare Other

## 2012-07-03 ENCOUNTER — Other Ambulatory Visit (INDEPENDENT_AMBULATORY_CARE_PROVIDER_SITE_OTHER): Payer: Medicare Other

## 2012-07-03 DIAGNOSIS — E785 Hyperlipidemia, unspecified: Secondary | ICD-10-CM

## 2012-07-03 DIAGNOSIS — R7309 Other abnormal glucose: Secondary | ICD-10-CM

## 2012-07-03 DIAGNOSIS — R972 Elevated prostate specific antigen [PSA]: Secondary | ICD-10-CM

## 2012-07-03 DIAGNOSIS — T887XXA Unspecified adverse effect of drug or medicament, initial encounter: Secondary | ICD-10-CM

## 2012-07-03 LAB — BASIC METABOLIC PANEL
BUN: 13 mg/dL (ref 6–23)
CO2: 28 mEq/L (ref 19–32)
Calcium: 9.9 mg/dL (ref 8.4–10.5)
Chloride: 101 mEq/L (ref 96–112)
Creatinine, Ser: 0.7 mg/dL (ref 0.4–1.5)
GFR: 116.43 mL/min (ref >60.00)
Glucose, Bld: 101 mg/dL — ABNORMAL HIGH (ref 70–99)
Potassium: 4.9 mEq/L (ref 3.5–5.1)
Sodium: 136 mEq/L (ref 135–145)

## 2012-07-03 LAB — LIPID PANEL
Cholesterol: 186 mg/dL (ref 0–200)
HDL: 54.4 mg/dL (ref >39.00)
LDL Cholesterol: 115 mg/dL — ABNORMAL HIGH (ref 0–99)
Total CHOL/HDL Ratio: 3
Triglycerides: 84 mg/dL (ref 0.0–149.0)
VLDL: 16.8 mg/dL (ref 0.0–40.0)

## 2012-07-03 LAB — HEPATIC FUNCTION PANEL
ALT: 30 U/L (ref 0–53)
AST: 27 U/L (ref 0–37)
Albumin: 4.3 g/dL (ref 3.5–5.2)
Alkaline Phosphatase: 47 U/L (ref 39–117)
Bilirubin, Direct: 0.2 mg/dL (ref 0.0–0.3)
Total Bilirubin: 0.6 mg/dL (ref 0.3–1.2)
Total Protein: 7.5 g/dL (ref 6.0–8.3)

## 2012-07-03 LAB — PSA: PSA: 6.47 ng/mL — ABNORMAL HIGH (ref 0.10–4.00)

## 2012-07-03 LAB — HEMOGLOBIN A1C: Hgb A1c MFr Bld: 5.6 % (ref 4.6–6.5)

## 2012-07-10 ENCOUNTER — Ambulatory Visit (INDEPENDENT_AMBULATORY_CARE_PROVIDER_SITE_OTHER): Payer: Medicare Other | Admitting: Cardiovascular Disease

## 2012-07-10 ENCOUNTER — Encounter: Payer: Self-pay | Admitting: Cardiovascular Disease

## 2012-07-10 ENCOUNTER — Ambulatory Visit (INDEPENDENT_AMBULATORY_CARE_PROVIDER_SITE_OTHER): Payer: Medicare Other | Admitting: Internal Medicine

## 2012-07-10 ENCOUNTER — Encounter: Payer: Self-pay | Admitting: Internal Medicine

## 2012-07-10 VITALS — BP 126/88 | HR 87 | Wt 148.4 lb

## 2012-07-10 VITALS — BP 152/100 | HR 81 | Ht 63.0 in | Wt 148.0 lb

## 2012-07-10 DIAGNOSIS — R7309 Other abnormal glucose: Secondary | ICD-10-CM

## 2012-07-10 DIAGNOSIS — R972 Elevated prostate specific antigen [PSA]: Secondary | ICD-10-CM

## 2012-07-10 DIAGNOSIS — I2510 Atherosclerotic heart disease of native coronary artery without angina pectoris: Secondary | ICD-10-CM

## 2012-07-10 DIAGNOSIS — I10 Essential (primary) hypertension: Secondary | ICD-10-CM

## 2012-07-10 DIAGNOSIS — I493 Ventricular premature depolarization: Secondary | ICD-10-CM

## 2012-07-10 DIAGNOSIS — I4949 Other premature depolarization: Secondary | ICD-10-CM

## 2012-07-10 DIAGNOSIS — I252 Old myocardial infarction: Secondary | ICD-10-CM

## 2012-07-10 DIAGNOSIS — E785 Hyperlipidemia, unspecified: Secondary | ICD-10-CM

## 2012-07-10 DIAGNOSIS — Z23 Encounter for immunization: Secondary | ICD-10-CM

## 2012-07-10 MED ORDER — SIMVASTATIN 20 MG PO TABS: 40.0000 mg | ORAL_TABLET | Freq: Every day | ORAL | Status: DC

## 2012-07-10 NOTE — Assessment & Plan Note (Signed)
His LDL is not at goal of less than 70. Simvastatin will be increased to 40 mg at bedtime with repeat fasting lipids, CK, and liver function test after 10 weeks. He preferred to continue with it generic agent rather than changing to Crestor 20 mg.

## 2012-07-10 NOTE — Progress Notes (Signed)
History of Present Illness: 71 yo WM with history of CAD s/p MI in 1992 with PTCA, HLD here today for cardiac follow up. I saw him in 2012 to establish cardiology care. He had been followed in the past by Dr. Aleen Campi. He had an MI in 1992 and had balloon angioplasty.  He had some c/o chest pains and SOB at his visit here December 2012. Stress myoview December 2012 with no ischemia, LVEF=45%. Exercise stress test 06/25/12 and he exercised for 9 minutes of the Bruce protocol and he did well with no chest pain, SOB, fatigue. No EKG changes to suggest ischemia. He did have PVCs during exercise and in recovery.   He is here today for follow up. He has had no chest pains, SOB. His BP has been very well controlled at home. 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 This is detailed above.   Primary Care Physician: Dr. Alwyn Ren  Last Lipid Profile:Lipid Panel     Component Value Date/Time   CHOL 186 07/03/2012 0819   TRIG 84.0 07/03/2012 0819   HDL 54.40 07/03/2012 0819   CHOLHDL 3 07/03/2012 0819   VLDL 16.8 07/03/2012 0819   LDLCALC 115* 07/03/2012 0819     Past Medical History  Diagnosis Date  . Hyperlipidemia   . Diverticulosis   . Coronary artery disease     MI 1992, S/P  PTCA; negative stress test in November 2011 with no ischemia.     Past Surgical History  Procedure Date  . Ptca 1992  . Inguinal heriiorrhaphy bilaterally   . Colonoscopy w/ polypectomy 2003     negative 2010,due 2020; Dr Juanda Chance  . Pilonidal cyst excision   . Rotator cuff repair     Bilateral    Current Outpatient Prescriptions  Medication Sig Dispense Refill  . aspirin 81 MG tablet Take 81 mg by mouth daily.        . Multiple Vitamin (MULTIVITAMIN) tablet Take 1 tablet by mouth daily.        . simvastatin (ZOCOR) 20 MG tablet Take 2 tablets (40 mg total) by mouth at bedtime.  90 tablet  0  . [DISCONTINUED] simvastatin (ZOCOR) 20 MG tablet TAKE 1  TABLET BY MOUTH AT BEDTIME  90 tablet  2    No Known Allergies  History   Social History  . Marital Status: Married    Spouse Name: N/A    Number of Children: N/A  . Years of Education: N/A   Occupational History  . Retired Dentist    Social History Main Topics  . Smoking status: Former Smoker -- 2.0 packs/day for 30 years    Types: Cigarettes    Quit date: 08/21/1990  . Smokeless tobacco: Not on file     Comment: QUIT 15 YEARS AGO  . Alcohol Use: 8.4 oz/week    14 Glasses of wine per week  . Drug Use: No  . Sexually Active: Not on file   Other Topics Concern  . Not on file   Social History Narrative   HEART HEALTHY DIETRETIREDMARRIEDFORMER SMOKER QUIT 1992ALCOHOL USE -YES- RED WINE SOCIALLYSMOKING -QUIT    Family History  Problem Relation Age of Onset  . Hypertension Mother   . Prostate cancer Father     Prostate cancer  . Lung cancer Sister     NON SMOKER  . Diabetes Neg Hx   . Stroke Neg Hx  Review of Systems:  As stated in the HPI and otherwise negative.   BP 152/100  Pulse 81  Ht 5\' 3"  (1.6 m)  Wt 148 lb (67.132 kg)  BMI 26.22 kg/m2  SpO2 95%  Physical Examination: General: Well developed, well nourished, NAD HEENT: OP clear, mucus membranes moist SKIN: warm, dry. No rashes. Neuro: No focal deficits Musculoskeletal: Muscle strength 5/5 all ext Psychiatric: Mood and affect normal Neck: No JVD, no carotid bruits, no thyromegaly, no lymphadenopathy. Lungs:Clear bilaterally, no wheezes, rhonci, crackles Cardiovascular: Regular rate and rhythm. No murmurs, gallops or rubs. Abdomen:Soft. Bowel sounds present. Non-tender.  Extremities: No lower extremity edema. Pulses are 2 + in the bilateral DP/PT.  EKG: 06/25/12: NSR, rate 82 bpm  Assessment and Plan:   1. CAD: Stable. Exercise stress test 06/25/12 with no EKG evidence of ischemia. He did have PVCs during exercise and in recovery. Will likely benefit from a beta blocker in the future.  He has been scheduled for an echo in 2 weeks. Will see him back after the echo. Will discuss starting low dose beta blocker at that time. 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   2. HTN: BP well controlled at home and at primary care visit this am. No changes today.

## 2012-07-10 NOTE — Patient Instructions (Addendum)
The PSA (prostate cancer screening test) preferred goal is < 2.00, ideally < 1.00. Regardless of the value , it should not rise more than 0.75 units per year.      Review and correct the record as indicated. Please share record with all medical staff seen.  Please  schedule fasting Labs in TEN weeks : CK,Lipids, hepatic panel. PLEASE BRING THESE INSTRUCTIONS TO FOLLOW UP  LAB APPOINTMENT.This will guarantee correct labs are drawn, eliminating need for repeat blood sampling ( needle sticks ! ). Diagnoses /Codes: 272.4,995.20 If you activate My Chart; the results can be released to you as soon as they populate from the lab. If you choose not to use this program; the labs have to be reviewed, copied & mailed   causing a delay in getting the results to you.

## 2012-07-10 NOTE — Assessment & Plan Note (Addendum)
No Diabetes risk present as A1c is < 6.1%

## 2012-07-10 NOTE — Progress Notes (Signed)
  Subjective:    Patient ID: Jordan Mccullough, male    DOB: 26-Jul-1941, 71 y.o.   MRN: [id number]  HPI HYPERLIPIDEMIA: LDL 115; goal = < 70 with CAD Chest pain, palpitations- no       Dyspnea- no Abd pain, bowel changes- no   Muscle aches- no Lightheadedness,Syncope- no    Edema- no Medications: Compliance- yes   FASTING HYPERGLYCEMIA, PMH of:  Disease Monitoring: FBS 101; A1c 5.6%  Polyuria/phagia/dipsia- no       Visual problems- no Medications: Compliance- no meds; high fiber diet           Review of Systems PSA is 6.47; 11 months ago it was 4.43. Prior to that time the PSA range had been 3.01-3.77. He denies hematuria, pyuria, or dysuria. He has no difficulty starting or stopping his stream. His father did have prostate cancer.     Objective:   Physical Exam General appearance:thin but in good health and nourishment w/o distress.  Eyes: No conjunctival inflammation or scleral icterus is present.  Oral exam: Dental hygiene is good; lips and gums are healthy appearing.There is no oropharyngeal erythema or exudate noted.   Heart:  Normal rate and regular rhythm. S1 and S2 normal without gallop, click,or  rub .Grade 1/6 systolic murmur   Lungs:Chest clear to auscultation; no wheezes, rhonchi,rales ,or rubs present.No increased work of breathing.   Abdomen: bowel sounds normal, soft and non-tender without masses, organomegaly or hernias noted.  No guarding or rebound   Skin:Warm & dry.  Intact without suspicious lesions or rashes ; no jaundice or tenting  Genitalia normal except for left varices. Prostate is asymmetric ; L lobe enlarged w/o  nodularity.   Lymphatic: No lymphadenopathy is noted about the head, neck, axilla, or inguinal areas.              Assessment & Plan:

## 2012-07-10 NOTE — Patient Instructions (Addendum)
Your physician recommends that you schedule a follow-up appointment in:  6-8 weeks. --August 29, 2012 at 9:00

## 2012-07-12 ENCOUNTER — Telehealth: Payer: Self-pay | Admitting: *Deleted

## 2012-07-12 NOTE — Telephone Encounter (Signed)
Paperwork pt needs for [employer] completed and left at front desk.  Pt plans to pick up when here  on July 24, 2012 for echo

## 2012-07-22 ENCOUNTER — Other Ambulatory Visit (HOSPITAL_COMMUNITY): Payer: Medicare Other

## 2012-07-24 ENCOUNTER — Other Ambulatory Visit (HOSPITAL_COMMUNITY): Payer: Medicare Other

## 2012-07-25 ENCOUNTER — Ambulatory Visit: Payer: Medicare Other | Admitting: Cardiovascular Disease

## 2012-07-26 ENCOUNTER — Ambulatory Visit (HOSPITAL_COMMUNITY): Payer: Medicare Other | Attending: Cardiology | Admitting: Radiology

## 2012-07-26 DIAGNOSIS — I517 Cardiomegaly: Secondary | ICD-10-CM | POA: Insufficient documentation

## 2012-07-26 DIAGNOSIS — I359 Nonrheumatic aortic valve disorder, unspecified: Secondary | ICD-10-CM | POA: Insufficient documentation

## 2012-07-26 DIAGNOSIS — E785 Hyperlipidemia, unspecified: Secondary | ICD-10-CM | POA: Insufficient documentation

## 2012-07-26 DIAGNOSIS — I259 Chronic ischemic heart disease, unspecified: Secondary | ICD-10-CM

## 2012-07-26 DIAGNOSIS — I252 Old myocardial infarction: Secondary | ICD-10-CM | POA: Insufficient documentation

## 2012-07-26 DIAGNOSIS — Z87891 Personal history of nicotine dependence: Secondary | ICD-10-CM | POA: Insufficient documentation

## 2012-07-26 DIAGNOSIS — I2510 Atherosclerotic heart disease of native coronary artery without angina pectoris: Secondary | ICD-10-CM | POA: Insufficient documentation

## 2012-07-26 NOTE — Progress Notes (Signed)
Echocardiogram performed.  

## 2012-07-31 ENCOUNTER — Telehealth: Payer: Self-pay | Admitting: Cardiovascular Disease

## 2012-07-31 NOTE — Telephone Encounter (Signed)
F/u  Returning call back to nurse.  

## 2012-07-31 NOTE — Telephone Encounter (Signed)
Pt is aware of ECHO results Debbie Jevon Shells RN  

## 2012-08-03 ENCOUNTER — Other Ambulatory Visit: Payer: Self-pay | Admitting: Internal Medicine

## 2012-08-21 DIAGNOSIS — I739 Peripheral vascular disease, unspecified: Secondary | ICD-10-CM

## 2012-08-21 HISTORY — DX: Peripheral vascular disease, unspecified: I73.9

## 2012-08-29 ENCOUNTER — Ambulatory Visit: Payer: Medicare Other | Admitting: Cardiovascular Disease

## 2012-09-15 ENCOUNTER — Other Ambulatory Visit: Payer: Self-pay | Admitting: Internal Medicine

## 2012-09-18 ENCOUNTER — Telehealth: Payer: Self-pay | Admitting: Cardiovascular Disease

## 2012-09-18 NOTE — Telephone Encounter (Signed)
Spoke with [place] at [facility]. Pt needs to have bladder and ureter biopsy. They prefer to have pt stop ASA 5 days prior to procedure if possible.  Notes to be faxed to our office.

## 2012-09-18 NOTE — Telephone Encounter (Signed)
New Problem:    Called in needing a surgical clearance for the patient.  Please call back.

## 2012-09-19 ENCOUNTER — Encounter: Payer: Self-pay | Admitting: Cardiovascular Disease

## 2012-09-19 NOTE — Telephone Encounter (Signed)
Letter written by Dr. Clifton James. Will have medical records fax to [facility].

## 2012-09-23 ENCOUNTER — Other Ambulatory Visit: Payer: Self-pay | Admitting: Urology

## 2012-09-23 NOTE — Progress Notes (Signed)
Per v/o Dr Lanell Matar at [facility]-  KUB not needed day of procedure- order deleted as given

## 2012-09-26 ENCOUNTER — Other Ambulatory Visit: Payer: Self-pay | Admitting: Urology

## 2012-09-30 ENCOUNTER — Encounter (HOSPITAL_COMMUNITY): Payer: Self-pay | Admitting: Pharmacy Technician

## 2012-10-02 ENCOUNTER — Encounter (HOSPITAL_COMMUNITY)
Admission: RE | Admit: 2012-10-02 | Discharge: 2012-10-02 | Disposition: A | Discharge status: Home / Self Care | Payer: Medicare Other | Source: Ambulatory Visit | Attending: Urology | Admitting: Urology

## 2012-10-02 ENCOUNTER — Ambulatory Visit (HOSPITAL_COMMUNITY)
Admission: RE | Admit: 2012-10-02 | Discharge: 2012-10-02 | Disposition: A | Discharge status: Home / Self Care | Payer: Medicare Other | Source: Ambulatory Visit | Attending: Urology | Admitting: Urology

## 2012-10-02 ENCOUNTER — Encounter (HOSPITAL_COMMUNITY): Payer: Self-pay

## 2012-10-02 ENCOUNTER — Other Ambulatory Visit: Payer: Self-pay

## 2012-10-02 DIAGNOSIS — E785 Hyperlipidemia, unspecified: Secondary | ICD-10-CM | POA: Insufficient documentation

## 2012-10-02 DIAGNOSIS — R310 Gross hematuria: Secondary | ICD-10-CM | POA: Insufficient documentation

## 2012-10-02 DIAGNOSIS — I2510 Atherosclerotic heart disease of native coronary artery without angina pectoris: Secondary | ICD-10-CM | POA: Insufficient documentation

## 2012-10-02 DIAGNOSIS — N329 Bladder disorder, unspecified: Secondary | ICD-10-CM | POA: Insufficient documentation

## 2012-10-02 DIAGNOSIS — R972 Elevated prostate specific antigen [PSA]: Secondary | ICD-10-CM | POA: Insufficient documentation

## 2012-10-02 DIAGNOSIS — Z01812 Encounter for preprocedural laboratory examination: Secondary | ICD-10-CM | POA: Insufficient documentation

## 2012-10-02 DIAGNOSIS — I252 Old myocardial infarction: Secondary | ICD-10-CM | POA: Insufficient documentation

## 2012-10-02 DIAGNOSIS — Z01810 Encounter for preprocedural cardiovascular examination: Secondary | ICD-10-CM | POA: Insufficient documentation

## 2012-10-02 HISTORY — DX: Peripheral vascular disease, unspecified: I73.9

## 2012-10-02 LAB — BASIC METABOLIC PANEL
BUN: 12 mg/dL (ref 6–23)
CO2: 27 mEq/L (ref 19–32)
Calcium: 9.7 mg/dL (ref 8.4–10.5)
Chloride: 101 mEq/L (ref 96–112)
Creatinine, Ser: 0.66 mg/dL (ref 0.50–1.35)
GFR calc Af Amer: >90 mL/min (ref >90)
GFR calc non Af Amer: >90 mL/min (ref >90)
Glucose, Bld: 103 mg/dL — ABNORMAL HIGH (ref 70–99)
Potassium: 4.7 mEq/L (ref 3.5–5.1)
Sodium: 136 mEq/L (ref 135–145)

## 2012-10-02 LAB — CBC
HCT: 42.3 % (ref 39.0–52.0)
Hemoglobin: 14.5 g/dL (ref 13.0–17.0)
MCH: 30.9 pg (ref 26.0–34.0)
MCHC: 34.3 g/dL (ref 30.0–36.0)
MCV: 90.2 fL (ref 78.0–100.0)
Platelets: 350 10*3/uL (ref 150–400)
RBC: 4.69 MIL/uL (ref 4.22–5.81)
RDW: 14 % (ref 11.5–15.5)
WBC: 8.1 10*3/uL (ref 4.0–10.5)

## 2012-10-02 LAB — SURGICAL PCR SCREEN
MRSA, PCR: NEGATIVE
Staphylococcus aureus: POSITIVE — AB

## 2012-10-02 NOTE — Patient Instructions (Addendum)
20 AHAAN ZOBRIST  10/02/2012   Your procedure is scheduled on:  10/09/12  [facility]  Report to [facility] at    1015   AM.  Call this number if you have problems the morning of surgery: [phone]       Remember:   Do not eat food  Or drink :After Midnight.TUESDAY NIGHT   Take these medicines the morning of surgery with A SIP OF WATER: NONE   .  Contacts, dentures or partial plates can not be worn to surgery  Leave suitcase in the car. After surgery it may be brought to your room.  For patients admitted to the hospital, checkout time is 11:00 AM day of  discharge.             SPECIAL INSTRUCTIONS- SEE [place] PREPARING FOR SURGERY INSTRUCTION SHEET-     DO NOT WEAR JEWELRY, LOTIONS, POWDERS, OR PERFUMES.  WOMEN-- DO NOT SHAVE LEGS OR UNDERARMS FOR 12 HOURS BEFORE SHOWERS. MEN MAY SHAVE FACE.  Patients discharged the day of surgery will not be allowed to drive home. IF going home the day of surgery, you must have a driver and someone to stay with you for the first 24 hours  Name and phone number of your wife  MAUREEN:                                                                        Please read over the following fact sheets that you were given: MRSA Information, Incentive Spirometry Sheet, Blood Transfusion Sheet  Information                                                                                   Glyndon Tursi  PST [phone]  [phone]                 FAILURE TO FOLLOW THESE INSTRUCTIONS MAY RESULT IN  CANCELLATION   OF YOUR SURGERY                                                  Patient Signature _____________________________

## 2012-10-02 NOTE — Progress Notes (Signed)
Clearance with note Dr Sanjuana Kava on chart, last stress test 11/13 EPIC,  UNABLE TO LOCATE RESTING EKG IN EPIC SYSTEM. Abd CT 1/14 report on chart. States has appt with vascular and vein 10/14/12 for evaluation of aneurysm

## 2012-10-03 ENCOUNTER — Inpatient Hospital Stay (HOSPITAL_COMMUNITY): Admission: RE | Admit: 2012-10-03 | Payer: Medicare Other | Source: Ambulatory Visit

## 2012-10-03 NOTE — Progress Notes (Signed)
Pt notified of +PCR 10/02/12 instructed to fill Rx for Mupuricin and use as directed BID

## 2012-10-09 ENCOUNTER — Encounter (HOSPITAL_COMMUNITY): Payer: Self-pay | Admitting: *Deleted

## 2012-10-09 ENCOUNTER — Ambulatory Visit (HOSPITAL_COMMUNITY)
Admission: RE | Admit: 2012-10-09 | Discharge: 2012-10-09 | Disposition: A | Discharge status: Home / Self Care | Payer: Medicare Other | Source: Ambulatory Visit | Attending: Urology | Admitting: Urology

## 2012-10-09 ENCOUNTER — Ambulatory Visit (HOSPITAL_COMMUNITY): Payer: Medicare Other | Admitting: Certified Registered Nurse Anesthetist

## 2012-10-09 ENCOUNTER — Encounter (HOSPITAL_COMMUNITY)
Admission: RE | Disposition: A | Discharge status: Home / Self Care | Payer: Self-pay | Source: Ambulatory Visit | Attending: Urology

## 2012-10-09 ENCOUNTER — Encounter (HOSPITAL_COMMUNITY): Payer: Self-pay | Admitting: Certified Registered Nurse Anesthetist

## 2012-10-09 DIAGNOSIS — I2510 Atherosclerotic heart disease of native coronary artery without angina pectoris: Secondary | ICD-10-CM | POA: Insufficient documentation

## 2012-10-09 DIAGNOSIS — I252 Old myocardial infarction: Secondary | ICD-10-CM | POA: Insufficient documentation

## 2012-10-09 DIAGNOSIS — I739 Peripheral vascular disease, unspecified: Secondary | ICD-10-CM | POA: Insufficient documentation

## 2012-10-09 DIAGNOSIS — E785 Hyperlipidemia, unspecified: Secondary | ICD-10-CM | POA: Insufficient documentation

## 2012-10-09 DIAGNOSIS — R972 Elevated prostate specific antigen [PSA]: Secondary | ICD-10-CM

## 2012-10-09 DIAGNOSIS — N329 Bladder disorder, unspecified: Secondary | ICD-10-CM

## 2012-10-09 DIAGNOSIS — N400 Enlarged prostate without lower urinary tract symptoms: Secondary | ICD-10-CM | POA: Insufficient documentation

## 2012-10-09 HISTORY — PX: CYSTOSCOPY/RETROGRADE/URETEROSCOPY: SHX5316

## 2012-10-09 HISTORY — PX: PROSTATE BIOPSY: SHX241

## 2012-10-09 SURGERY — CYSTOSCOPY/RETROGRADE/URETEROSCOPY
Anesthesia: General | Wound class: Clean Contaminated

## 2012-10-09 MED ORDER — ACETAMINOPHEN 10 MG/ML IV SOLN
INTRAVENOUS | Status: DC | PRN
  Administered 2012-10-09: 1000 mg via INTRAVENOUS

## 2012-10-09 MED ORDER — ACETAMINOPHEN 10 MG/ML IV SOLN
INTRAVENOUS | Status: AC
  Filled 2012-10-09: qty 100

## 2012-10-09 MED ORDER — FENTANYL CITRATE 0.05 MG/ML IJ SOLN: 25.0000 ug | INTRAMUSCULAR | Status: DC | PRN

## 2012-10-09 MED ORDER — LIDOCAINE HCL (CARDIAC) 20 MG/ML IV SOLN
INTRAVENOUS | Status: DC | PRN
  Administered 2012-10-09: 80 mg via INTRAVENOUS

## 2012-10-09 MED ORDER — PROPOFOL 10 MG/ML IV BOLUS
INTRAVENOUS | Status: DC | PRN
  Administered 2012-10-09: 30 mg via INTRAVENOUS
  Administered 2012-10-09: 20 mg via INTRAVENOUS
  Administered 2012-10-09: 150 mg via INTRAVENOUS

## 2012-10-09 MED ORDER — SENNOSIDES-DOCUSATE SODIUM 8.6-50 MG PO TABS: 1.0000 | ORAL_TABLET | Freq: Two times a day (BID) | ORAL | Status: DC

## 2012-10-09 MED ORDER — FENTANYL CITRATE 0.05 MG/ML IJ SOLN
INTRAMUSCULAR | Status: DC | PRN
  Administered 2012-10-09: 50 ug via INTRAVENOUS
  Administered 2012-10-09: 100 ug via INTRAVENOUS
  Administered 2012-10-09: 25 ug via INTRAVENOUS
  Administered 2012-10-09: 50 ug via INTRAVENOUS
  Administered 2012-10-09: 25 ug via INTRAVENOUS

## 2012-10-09 MED ORDER — OXYBUTYNIN CHLORIDE 5 MG PO TABS: 5.0000 mg | ORAL_TABLET | Freq: Four times a day (QID) | ORAL | Status: DC | PRN

## 2012-10-09 MED ORDER — IOHEXOL 300 MG/ML  SOLN
INTRAMUSCULAR | Status: DC | PRN
  Administered 2012-10-09: 55 mL via INTRAVENOUS

## 2012-10-09 MED ORDER — PHENAZOPYRIDINE HCL 100 MG PO TABS: 100.0000 mg | ORAL_TABLET | Freq: Three times a day (TID) | ORAL | Status: DC | PRN

## 2012-10-09 MED ORDER — HYOSCYAMINE SULFATE 0.125 MG PO TABS: 0.1250 mg | ORAL_TABLET | ORAL | Status: DC | PRN

## 2012-10-09 MED ORDER — PHENYLEPHRINE HCL 10 MG/ML IJ SOLN
INTRAMUSCULAR | Status: DC | PRN
  Administered 2012-10-09 (×4): 20 ug via INTRAVENOUS

## 2012-10-09 MED ORDER — OXYCODONE-ACETAMINOPHEN 5-325 MG PO TABS: 1.0000 | ORAL_TABLET | ORAL | Status: DC | PRN

## 2012-10-09 MED ORDER — LIDOCAINE HCL 2 % EX GEL
CUTANEOUS | Status: AC
  Filled 2012-10-09: qty 10

## 2012-10-09 MED ORDER — MIDAZOLAM HCL 5 MG/5ML IJ SOLN
INTRAMUSCULAR | Status: DC | PRN
  Administered 2012-10-09 (×2): 1 mg via INTRAVENOUS

## 2012-10-09 MED ORDER — LIDOCAINE HCL 2 % IJ SOLN
INTRAMUSCULAR | Status: AC
  Filled 2012-10-09: qty 20

## 2012-10-09 MED ORDER — IOHEXOL 300 MG/ML  SOLN
INTRAMUSCULAR | Status: AC
  Filled 2012-10-09: qty 1

## 2012-10-09 MED ORDER — BELLADONNA ALKALOIDS-OPIUM 16.2-60 MG RE SUPP
RECTAL | Status: DC | PRN
  Administered 2012-10-09: 1 via RECTAL

## 2012-10-09 MED ORDER — LACTATED RINGERS IV SOLN: INTRAVENOUS | Status: DC

## 2012-10-09 MED ORDER — SODIUM CHLORIDE 0.9 % IR SOLN
Status: DC | PRN
  Administered 2012-10-09: 10000 mL via INTRAVESICAL

## 2012-10-09 MED ORDER — BUPIVACAINE HCL (PF) 0.25 % IJ SOLN
INTRAMUSCULAR | Status: AC
  Filled 2012-10-09: qty 30

## 2012-10-09 MED ORDER — LACTATED RINGERS IV SOLN
INTRAVENOUS | Status: DC
  Administered 2012-10-09: 1000 mL via INTRAVENOUS
  Administered 2012-10-09: 14:00:00 via INTRAVENOUS

## 2012-10-09 MED ORDER — EPHEDRINE SULFATE 50 MG/ML IJ SOLN
INTRAMUSCULAR | Status: DC | PRN
  Administered 2012-10-09 (×2): 5 mg via INTRAVENOUS

## 2012-10-09 MED ORDER — BUPIVACAINE HCL 0.25 % IJ SOLN
INTRAMUSCULAR | Status: DC | PRN
  Administered 2012-10-09: 10 mL

## 2012-10-09 MED ORDER — BELLADONNA ALKALOIDS-OPIUM 16.2-60 MG RE SUPP
RECTAL | Status: AC
  Filled 2012-10-09: qty 1

## 2012-10-09 MED ORDER — STERILE WATER FOR IRRIGATION IR SOLN
Status: DC | PRN
  Administered 2012-10-09: 1000 mL

## 2012-10-09 MED ORDER — ONDANSETRON HCL 4 MG/2ML IJ SOLN
INTRAMUSCULAR | Status: DC | PRN
  Administered 2012-10-09: 4 mg via INTRAVENOUS

## 2012-10-09 MED ORDER — DEXTROSE 5 % IV SOLN
2.0000 g | Freq: Once | INTRAVENOUS | Status: AC
  Administered 2012-10-09: 2 g via INTRAVENOUS
  Filled 2012-10-09: qty 2

## 2012-10-09 SURGICAL SUPPLY — 28 items
ADAPTER CATH URET PLST 4-6FR (CATHETERS) ×3 IMPLANT
BAG URO CATCHER STRL LF (DRAPE) ×3 IMPLANT
BASKET ZERO TIP NITINOL 2.4FR (BASKET) IMPLANT
CATH INTERMIT  6FR 70CM (CATHETERS) ×3 IMPLANT
CATH TIEMANN FOLEY 18FR 5CC (CATHETERS) ×3 IMPLANT
CATH URET 5FR 28IN OPEN ENDED (CATHETERS) ×3 IMPLANT
CLOTH BEACON ORANGE TIMEOUT ST (SAFETY) ×3 IMPLANT
DRAPE CAMERA CLOSED 9X96 (DRAPES) ×3 IMPLANT
DRESSING TELFA 8X3 (GAUZE/BANDAGES/DRESSINGS) IMPLANT
ELECT LOOP MED HF 24F 12D CBL (CLIP) ×3 IMPLANT
GLOVE BIOGEL M 7.0 STRL (GLOVE) ×3 IMPLANT
GLOVE BIOGEL PI IND STRL 7.5 (GLOVE) IMPLANT
GLOVE BIOGEL PI INDICATOR 7.5 (GLOVE)
GLOVE ECLIPSE 7.0 STRL STRAW (GLOVE) ×3 IMPLANT
GOWN PREVENTION PLUS XLARGE (GOWN DISPOSABLE) ×6 IMPLANT
GOWN STRL NON-REIN LRG LVL3 (GOWN DISPOSABLE) ×3 IMPLANT
GUIDEWIRE ANG ZIPWIRE 038X150 (WIRE) IMPLANT
GUIDEWIRE STR DUAL SENSOR (WIRE) ×3 IMPLANT
IV NS IRRIG 3000ML ARTHROMATIC (IV SOLUTION) IMPLANT
MANIFOLD NEPTUNE II (INSTRUMENTS) ×3 IMPLANT
PACK CYSTO (CUSTOM PROCEDURE TRAY) ×3 IMPLANT
SCRUB PCMX 4 OZ (MISCELLANEOUS) IMPLANT
SYRINGE IRR TOOMEY STRL 70CC (SYRINGE) ×3 IMPLANT
TOWEL OR 17X26 10 PK STRL BLUE (TOWEL DISPOSABLE) ×3 IMPLANT
TUBING CONNECTING 10 (TUBING) ×3 IMPLANT
UNDERPAD 30X30 INCONTINENT (UNDERPADS AND DIAPERS) ×3 IMPLANT
WATER STERILE IRR 3000ML UROMA (IV SOLUTION) IMPLANT
WATER STERILE IRR 500ML POUR (IV SOLUTION) ×3 IMPLANT

## 2012-10-09 NOTE — Progress Notes (Signed)
Pt was able to void with full stream and urine clear yellow.

## 2012-10-09 NOTE — Progress Notes (Signed)
Pt up in room ambulating tolerating well.  Pt ambulated to bathroom with intermittent voiding associated with some burning.  Pt did not have a good flow.  Encouraged pt to drink more fluids/water.

## 2012-10-09 NOTE — Anesthesia Postprocedure Evaluation (Signed)
  Anesthesia Post-op Note  Patient: Jordan Mccullough  Procedure(s) Performed: Procedure(s) (LRB): BILATERAL RETROGRADE bladder and urethral BIOPSY  (Bilateral) PROSTATIC URETHRAL BIOPSY (N/A)  Patient Location: PACU  Anesthesia Type: General  Level of Consciousness: awake and alert   Airway and Oxygen Therapy: Patient Spontanous Breathing  Post-op Pain: mild  Post-op Assessment: Post-op Vital signs reviewed, Patient's Cardiovascular Status Stable, Respiratory Function Stable, Patent Airway and No signs of Nausea or vomiting  Last Vitals:  Filed Vitals:   10/09/12 1515  BP: 137/80  Pulse: 84  Temp:   Resp: 9    Post-op Vital Signs: stable   Complications: No apparent anesthesia complications

## 2012-10-09 NOTE — Anesthesia Procedure Notes (Signed)
Procedure Name: LMA Insertion Date/Time: 10/09/2012 1:40 PM Performed by: Harshika Mago K Pre-anesthesia Checklist: Patient identified, Emergency Drugs available, Suction available, Patient being monitored and Timeout performed Patient Re-evaluated:Patient Re-evaluated prior to inductionOxygen Delivery Method: Circle system utilized Preoxygenation: Pre-oxygenation with 100% oxygen Intubation Type: IV induction Ventilation: Mask ventilation without difficulty LMA: LMA with gastric port inserted LMA Size: 4.0

## 2012-10-09 NOTE — Transfer of Care (Signed)
Immediate Anesthesia Transfer of Care Note  Patient: Jordan Mccullough  Procedure(s) Performed: Procedure(s) with comments: BILATERAL RETROGRADE bladder and urethral BIOPSY  (Bilateral) - BILATERAL RETROGRADE  AND bladder and urethral BIOPSY   PROSTATIC URETHRAL BIOPSY (N/A) - PROSTATIC URETHRAL BIOPSY    Patient Location: PACU  Anesthesia Type:General  Level of Consciousness: awake, alert , oriented and patient cooperative  Airway & Oxygen Therapy: Patient Spontanous Breathing and Patient connected to face mask oxygen  Post-op Assessment: Report given to PACU RN and Post -op Vital signs reviewed and stable  Post vital signs: Reviewed and stable  Complications: No apparent anesthesia complications

## 2012-10-09 NOTE — Progress Notes (Signed)
Pt up ambulating in short stay area without difficulty.

## 2012-10-09 NOTE — Anesthesia Preprocedure Evaluation (Addendum)
Anesthesia Evaluation  Patient identified by MRN, date of birth, ID band Patient awake    Reviewed: Allergy & Precautions, H&P , NPO status , Patient's Chart, lab work & pertinent test results  Airway Mallampati: II TM Distance: >3 FB Neck ROM: full    Dental no notable dental hx. (+) Teeth Intact and Dental Advisory Given   Pulmonary neg pulmonary ROS,  breath sounds clear to auscultation  Pulmonary exam normal       Cardiovascular + CAD, + Past MI and + Peripheral Vascular Disease Rhythm:regular Rate:Normal  MI 1992.  S/p PTCA.  Neg myoview 11/11.  4.8 x 4.6 cm ifrarenal AAA   Neuro/Psych negative neurological ROS  negative psych ROS   GI/Hepatic negative GI ROS, Neg liver ROS,   Endo/Other  negative endocrine ROS  Renal/GU negative Renal ROS  negative genitourinary   Musculoskeletal   Abdominal   Peds  Hematology negative hematology ROS (+)   Anesthesia Other Findings   Reproductive/Obstetrics negative OB ROS                          Anesthesia Physical Anesthesia Plan  ASA: III  Anesthesia Plan: General   Post-op Pain Management:    Induction: Intravenous  Airway Management Planned: LMA  Additional Equipment:   Intra-op Plan:   Post-operative Plan: Extubation in OR  Informed Consent: I have reviewed the patients History and Physical, chart, labs and discussed the procedure including the risks, benefits and alternatives for the proposed anesthesia with the patient or authorized representative who has indicated his/her understanding and acceptance.   Dental Advisory Given  Plan Discussed with: CRNA and Surgeon  Anesthesia Plan Comments:         Anesthesia Quick Evaluation

## 2012-10-09 NOTE — Op Note (Signed)
DATE OF PROCEDURE: 10/09/12    OPERATIVE REPORT   SURGEON: Natalia Leatherwood, MD  ASSISTANT: None.   PREOPERATIVE DIAGNOSIS: Elevated PSA, Bladder lesions POSTOPERATIVE DIAGNOSIS: Same.   PROCEDURE PERFORMED:  1. Prostate biopsy.  2. Transrectal ultrasound imaging with interpretation.  3. Bilateral prostatic nerve block.  4. Cystoscopy 5. Bladder biopsy 6. Bilateral retrograde pyelograms 7. Prostatic urethra biopsy  ANESTHETIC: General  LOCAL MEDICATION: 0.25% Marcaine without epinephrine, 10 mL. Belladonna and opium suppository DRAINS: Foley (to be removed in PACU) COMPLICATIONS: None.   FINDINGS:  Prostate measurement: Volume: 68 cc.  Length: 5.68 cm. Height: 3.65 cm. Width: 6.28 cm.  There were calcifications in mid prostate. No hypoechoic areas.  Normal seminal vesicles.  Negative median lobe.   Specimen:  Prostate biopsy specimens Multiple bladder biopsy specimens Prostatic urethra biopsy  COMPLICATIONS: None.   HISTORY OF PRESENT ILLNESS:  72 year old male presents today with a history of gross hematuria. Workup in the office revealed bladder lesions that are erythematous throughout the bladder. He also had an elevated PSA. We discussed management options and he elects to proceed today with transrectal ultrasound, prostate biopsy, bladder biopsy, and bilateral retrograde pyelograms.  PROCEDURE: After informed consent was obtained, the patient was taken  to the operating room where he was placed in the supine position.  General anesthesia was induced and he was placed on his side.  SCDs were in place and turned on. IV antibiotics were infused. A time-out was then  performed in which the correct patient, surgical site, and procedure  were identified and agreed upon by the team.   I then performed a digital rectal examination and estimated his prostate to be 40 g in size. There were no nodules or induration noted.  Next, the rectal ultrasound probe was  advanced through the anus into the rectum. The  prostate was well visualized. I scanned the entire prostate and  performed measurements (results listed above).  Next, I performed a prostatic nerve block bilaterally by visualizing the base of the prostate with the junction of the seminal  vesicle on the right side and injecting 4 mL of 0.25% Marcaine without  epinephrine. Next, I advanced the probe to the apex of the right side  and injected 1 mL. Attention was turned to the left side at the junction of the base of the prostate and the seminal vesicle, and 4 mL were  injected here as well as 1 mL at the left apex giving a total of 10 mL  of injection. Each time before injecting, it did pull back on the  syringe to ensure I was not injecting into the vasculature.   Following this, the standard sextant biopsies were carried out by obtaining six  biopsies on each side at the lateral apex, midportion and base as well  as the medial apex, midportion, and base, bilaterally.   After this was done, there was noted to be no rectal bleeding or any other  complications. The probe was removed.   A second timeout was performed in which the correct patient, surgical site, and procedure were identified and agreed upon by the team.  The patient was placed in a dorsolithotomy position, making sure to pad all pertinent neurovascular pressure points. The genitals were prepped and draped in the usual sterile fashion.  A flexible cystoscope was advanced through the urethra and into the bladder. The bladder was fully distended and evaluated in a systematic fashion with a 12 and 70 lens. There were noted to be  dilated blood vessels circumferentially around the bladder neck and along the bladder trigone. There was also erythematous areas throughout the bladder without much involvement of the dome of the bladder. Cold cup biopsy forceps were then used to biopsy several of these areas throughout the bladder including  the right lateral wall, the posterior mid bladder wall, the left lateral bladder wall.  I then cannulated the right and left ureter orifices with a 5 French ureter catheter and injected contrast to obtain a retrograde PolyGram's. There were no filling defects bilaterally and each side in the out well.  Next I placed male urethral sounds beginning a 16 [place] and going up to 28 [place] in preparation for the resectoscope. I then placed the resectoscope using the visual obturator and using the loop I resected the area of blood vessels along the trigone and sent this for separate specimen. I then performed fulguration with the loop of the previous biopsy sites for bladder biopsy and other erythematous lesions throughout the bladder. There was good reflux emanating from both ureter orifices when I completed this portion of the procedure.  I then used a cold cup biopsy forcep to obtain a prosthetic urethral biopsy. This was sent separately for pathology. I then used the loupe resection device to cauterize this area. I placed an 20 French coud-tip catheter with 10 cc of sterile water into the bladder. I irrigated the catheter and it irrigated clear. I placed a belladonna and opium suppository into his rectum.  This completed the procedure. Anesthesia was reversed, he was placed back into a supine position, and taken to the PAC in stable condition.  His catheter will be removed in the recovery room. I will contact him with the results of the biopsy. He will continue his Levaquin as prescribed in preparation for his prostate biopsy.

## 2012-10-09 NOTE — H&P (Signed)
Urology History and Physical Exam  CC: Bladder lesion. Elevated PSA.  HPI: 72 year old male presents today for an elevated PSA and a bladder lesion. These were discovered on workup for gross hematuria. Cystoscopy revealed erythema in the bladder on the floor of the bladder and the right lateral bladder wall. This was concerning for CIS. It was very difficult to visualize the ureteral orifices during that procedure. He also had a PSA for workup of his gross hematuria was was elevated to a level of 7 in early December and repeat PSA later in December was 5.6 with a 22% free PSA. We discussed workup for both of these findings including biopsy the findings and his bladder and biopsy the findings of his PSA. There were no nodules palpated during rectal examination his prostate was estimated to be 40 g in size. He presents today for cystoscopy, bladder biopsy, acetic urethral biopsy, bilateral retrograde pyelograms with possible bilateral ureteroscopy and biopsy, transrectal ultrasound, prostate nerve block bilaterally, and prostate biopsy. We have discussed the risk, benefits, alternatives, and likelihood of achieving goals. We have discussed performing this on 2 separate occasions versus in one setting. We discussed the risk and benefits of this. He was cleared by Dr. Clifton James for the surgery and to be off of his aspirin for 5 days prior to the procedure. UA 09/16/12 was negative for signs of infection.  PMH: Past Medical History  Diagnosis Date  . Hyperlipidemia   . Diverticulosis   . Coronary artery disease     MI 1992, S/P  PTCA; negative stress test in November 2011 with no ischemia.   . Peripheral vascular disease 1/14    4.8x4.6 cm infrarenal abdominal aortic fusiform aneurysm, 1.5 cm right common iliac artery aneurysm    PSH: Past Surgical History  Procedure Laterality Date  . Ptca  1992  . Inguinal heriiorrhaphy bilaterally    . Colonoscopy w/ polypectomy  2003     negative 2010,due 2020;  Dr Juanda Chance  . Pilonidal cyst excision    . Rotator cuff repair      Bilateral    Allergies: No Known Allergies  Medications: No prescriptions prior to admission     Social History: History   Social History  . Marital Status: Married    Spouse Name: N/A    Number of Children: N/A  . Years of Education: N/A   Occupational History  . Retired Dentist    Social History Main Topics  . Smoking status: Current Some Day Smoker -- 1.00 packs/day for 20 years    Types: Cigarettes, Cigars    Last Attempt to Quit: 08/21/1990  . Smokeless tobacco: Never Used     Comment: QUIT 15 YEARS AGO  . Alcohol Use: 0.0 oz/week     Comment: 3-4 glasses wine weekly  . Drug Use: No  . Sexually Active: Not on file   Other Topics Concern  . Not on file   Social History Narrative   HEART HEALTHY DIET   RETIRED   MARRIED   FORMER SMOKER QUIT 1992   ALCOHOL USE -YES- RED WINE SOCIALLY   SMOKING -QUIT    Family History: Family History  Problem Relation Age of Onset  . Hypertension Mother   . Prostate cancer Father     Prostate cancer  . Lung cancer Sister     NON SMOKER  . Diabetes Neg Hx   . Stroke Neg Hx     Review of Systems: Positive: Gross hematuria Negative: Fever,  chest pain, or SOB.  A further 10 point review of systems was negative except what is listed in the HPI.  Physical Exam: Filed Vitals:   10/09/12 1018  BP: 146/101  Pulse: 96  Temp: 98.6 F (37 C)  Resp: 20    General: No acute distress.  Awake. Head:  Normocephalic.  Atraumatic. ENT:  EOMI.  Mucous membranes moist Neck:  Supple.  No lymphadenopathy. CV:  S1 present. S2 present. Regular rate. Pulmonary: Equal effort bilaterally.  Clear to auscultation bilaterally. Abdomen: Soft.  Non- tender to palpation. Skin:  Normal turgor.  No visible rash. Extremity: No gross deformity of bilateral upper extremities.  No gross deformity of    bilateral lower extremities. Neurologic: Alert. Appropriate  mood.   Studies:  No results found for this basename: HGB, WBC, PLT,  in the last 72 hours  No results found for this basename: NA, K, CL, CO2, BUN, CREATININE, CALCIUM, MAGNESIUM, GFRNONAA, GFRAA,  in the last 72 hours   No results found for this basename: PT, INR, APTT,  in the last 72 hours   No components found with this basename: ABG,     Assessment:  Bladder lesion and elevated PSA  Plan: To the operating room for cystoscopy, bladder biopsy, prostatic urethral biopsy, bilateral retrograde pyelogram with possible bilateral ureteroscopy and biopsy, transrectal ultrasound, bilateral prosthetic nerve block, and prostate biopsy.

## 2012-10-10 ENCOUNTER — Encounter (HOSPITAL_COMMUNITY): Payer: Self-pay | Admitting: Urology

## 2012-10-11 ENCOUNTER — Encounter: Payer: Self-pay | Admitting: Surgery

## 2012-10-14 ENCOUNTER — Ambulatory Visit (INDEPENDENT_AMBULATORY_CARE_PROVIDER_SITE_OTHER): Payer: Medicare Other | Admitting: Surgery

## 2012-10-14 ENCOUNTER — Encounter: Payer: Self-pay | Admitting: Surgery

## 2012-10-14 DIAGNOSIS — I714 Abdominal aortic aneurysm, without rupture, unspecified: Secondary | ICD-10-CM

## 2012-10-14 DIAGNOSIS — I723 Aneurysm of iliac artery: Secondary | ICD-10-CM | POA: Insufficient documentation

## 2012-10-14 DIAGNOSIS — Z9889 Other specified postprocedural states: Secondary | ICD-10-CM | POA: Insufficient documentation

## 2012-10-14 NOTE — Addendum Note (Signed)
Addended by: Dannielle Karvonen on: 10/14/2012 04:16 PM   Modules accepted: Orders

## 2012-10-14 NOTE — Progress Notes (Signed)
[facility]   Patient name: Jordan Mccullough MRN: [id number] DOB: 09-Aug-1941 Sex: male   Referred by: Dr. Margarita Grizzle  Reason for referral:  Chief Complaint  Patient presents with  . New Evaluation    AAA and bilateral aneurysm - Dr. Natalia Leatherwood    HISTORY OF PRESENT ILLNESS: This is a very pleasant 72 year old gentleman who was referred for evaluation and management of an abdominal aortic aneurysm. The patient recently underwent a CT scan of the abdomen and pelvis during a workup for gross hematuria. This found a 4.8 cm infrarenal abdominal aortic aneurysm with ectatic iliac arteries bilaterally measuring 1.5 cm on the right and 1.6 cm on the left. The patient denies having any abdominal or back pain.  The patient has recently undergone bladder and prostate biopsies for his gross hematuria. The biopsy results are pending. He has a history of coronary artery disease. He had a heart attack and 1992. This was treated with balloon angioplasty. His most recent stress Myoview was in December of 2012 with no evidence of ischemia. He is managed medically for his hypercholesterolemia with a statin. His HDL was 44 and LDL was 1:15. The patient has a history of smoking but quit in 1992. Unfortunately he recently began smoking about a pack a day beginning of this year.  Past Medical History  Diagnosis Date  . Hyperlipidemia   . Diverticulosis   . Coronary artery disease     MI 1992, S/P  PTCA; negative stress test in November 2011 with no ischemia.   . Peripheral vascular disease 1/14    4.8x4.6 cm infrarenal abdominal aortic fusiform aneurysm, 1.5 cm right common iliac artery aneurysm  . Myocardial infarction   . AAA (abdominal aortic aneurysm)     Past Surgical History  Procedure Laterality Date  . Ptca  1992  . Inguinal heriiorrhaphy bilaterally    . Colonoscopy w/ polypectomy  2003     negative 2010,due 2020; Dr Juanda Chance  . Pilonidal cyst excision    . Rotator  cuff repair      Bilateral  . Cystoscopy/retrograde/ureteroscopy Bilateral 10/09/2012    Procedure: BILATERAL RETROGRADE bladder and urethral BIOPSY ;  Surgeon: Milford Cage, MD;  Location: [facility];  Service: Urology;  Laterality: Bilateral;  BILATERAL RETROGRADE  AND bladder and urethral BIOPSY    . Prostate biopsy N/A 10/09/2012    Procedure: PROSTATIC URETHRAL BIOPSY;  Surgeon: Milford Cage, MD;  Location: [facility];  Service: Urology;  Laterality: N/A;  PROSTATIC URETHRAL BIOPSY      History   Social History  . Marital Status: Married    Spouse Name: N/A    Number of Children: N/A  . Years of Education: N/A   Occupational History  . Retired Dentist    Social History Main Topics  . Smoking status: Current Some Day Smoker -- 1.00 packs/day for 20 years    Types: Cigarettes, Cigars    Last Attempt to Quit: 08/21/1990  . Smokeless tobacco: Never Used     Comment: QUIT 15 YEARS AGO  . Alcohol Use: 0.0 oz/week     Comment: 3-4 glasses wine weekly  . Drug Use: No  . Sexually Active: Not on file   Other Topics Concern  . Not on file   Social History Narrative   HEART HEALTHY DIET   RETIRED   MARRIED   FORMER SMOKER QUIT 1992   ALCOHOL USE -YES- RED WINE SOCIALLY   SMOKING -QUIT  Family History  Problem Relation Age of Onset  . Hypertension Mother   . Prostate cancer Father     Prostate cancer  . Cancer Father   . Lung cancer Sister     NON SMOKER  . Cancer Sister   . Hyperlipidemia Sister   . Diabetes Neg Hx   . Stroke Neg Hx   . Hyperlipidemia Brother   . Heart attack Brother     Allergies as of 10/14/2012  . (No Known Allergies)    Current Outpatient Prescriptions on File Prior to Visit  Medication Sig Dispense Refill  . simvastatin (ZOCOR) 20 MG tablet Take 40 mg by mouth at bedtime.      . hyoscyamine (LEVSIN, ANASPAZ) 0.125 MG tablet Take 1 tablet (0.125 mg total) by mouth every 4 (four) hours as needed for cramping  (bladder spasms).  40 tablet  4  . oxybutynin (DITROPAN) 5 MG tablet Take 1 tablet (5 mg total) by mouth every 6 (six) hours as needed (bladder spasms).  40 tablet  4  . oxyCODONE-acetaminophen (PERCOCET) 5-325 MG per tablet Take 1-2 tablets by mouth every 4 (four) hours as needed for pain.  60 tablet  0  . phenazopyridine (PYRIDIUM) 100 MG tablet Take 1 tablet (100 mg total) by mouth every 8 (eight) hours as needed for pain (Burning urination.  Will turn urine and body fluids orange.).  30 tablet  1  . senna-docusate (SENOKOT S) 8.6-50 MG per tablet Take 1 tablet by mouth 2 (two) times daily.  60 tablet  0   No current facility-administered medications on file prior to visit.     REVIEW OF SYSTEMS: Cardiovascular: No chest pain, chest pressure, palpitations, orthopnea, or dyspnea on exertion. No claudication or rest pain,  No history of DVT or phlebitis. Pulmonary: No productive cough, asthma or wheezing. Neurologic: No weakness, paresthesias, aphasia, or amaurosis. No dizziness. Hematologic: No bleeding problems or clotting disorders. Musculoskeletal: No joint pain or joint swelling. Gastrointestinal: No blood in stool or hematemesis Genitourinary: Gross hematuria  Psychiatric:: No history of major depression. Integumentary: No rashes or ulcers. Constitutional: No fever or chills.  PHYSICAL EXAMINATION: General: The patient appears their stated age.  Vital signs are There were no vitals taken for this visit. HEENT:  No gross abnormalities Pulmonary: Respirations are non-labored Abdomen: Soft and non-tender. Aorta is palpable and nontender Musculoskeletal: There are no major deformities.   Neurologic: No focal weakness or paresthesias are detected, Skin: There are no ulcer or rashes noted. Psychiatric: The patient has normal affect. Cardiovascular: There is a regular rate and rhythm without significant murmur appreciated. No carotid bruits. Palpable pedal pulses bilaterally. No  prominent pulsation within the popliteal fossa  Diagnostic Studies: I have reviewed his outside CT scan which shows a 4.8 cm infrarenal abdominal aortic aneurysm with ectatic iliac arteries.  Outside Studies/Documentation Historical records were reviewed.  They showed abdominal aortic aneurysm, 4.8 cm  Medication Changes: None  Assessment:  4.8 cm infrarenal abdominal aortic aneurysm. Plan: I gave the patient and overview of aortic aneurysms. We discussed the size criteria for repair, as well as the natural history of growth. I also discussed what to do should he experience severe back pain or abdominal pain. I told him that I would recommend repair once his aneurysm becomes greater than 5 cm. We discussed approximately a 2-3 mm growth rate per year. Based on his outside CT scan, it appears that he would be a candidate for endovascular repair. I have scheduled him  to come back in 6 months with a repeat CT angiogram of the chest abdomen and pelvis. I will also obtain a preoperative carotid duplex to evaluate his carotid arteries. It the aneurysm diameter has increased in size, we will consider repair at that time.     Jorge Ny, M.D. [facility] Office: [phone] Pager:  [phone]

## 2013-02-15 ENCOUNTER — Other Ambulatory Visit: Payer: Self-pay | Admitting: Internal Medicine

## 2013-02-18 NOTE — Telephone Encounter (Signed)
Lipid/Hep/CK 272.4/995.20  

## 2013-04-01 ENCOUNTER — Other Ambulatory Visit: Payer: Self-pay | Admitting: Internal Medicine

## 2013-04-03 ENCOUNTER — Other Ambulatory Visit: Payer: Self-pay | Admitting: Internal Medicine

## 2013-04-11 ENCOUNTER — Encounter: Payer: Self-pay | Admitting: Surgery

## 2013-04-11 ENCOUNTER — Other Ambulatory Visit: Payer: Self-pay | Admitting: Surgery

## 2013-04-11 LAB — CREATININE, SERUM: Creat: 0.76 mg/dL (ref 0.50–1.35)

## 2013-04-11 LAB — BUN: BUN: 13 mg/dL (ref 6–23)

## 2013-04-14 ENCOUNTER — Other Ambulatory Visit (INDEPENDENT_AMBULATORY_CARE_PROVIDER_SITE_OTHER): Payer: Medicare Other | Admitting: *Deleted

## 2013-04-14 ENCOUNTER — Encounter: Payer: Self-pay | Admitting: Surgery

## 2013-04-14 ENCOUNTER — Ambulatory Visit
Admission: RE | Admit: 2013-04-14 | Discharge: 2013-04-14 | Disposition: A | Discharge status: Home / Self Care | Payer: Medicare Other | Source: Ambulatory Visit | Attending: Surgery | Admitting: Surgery

## 2013-04-14 ENCOUNTER — Ambulatory Visit (INDEPENDENT_AMBULATORY_CARE_PROVIDER_SITE_OTHER): Payer: Medicare Other | Admitting: Surgery

## 2013-04-14 VITALS — BP 150/85 | HR 74 | Ht 65.0 in | Wt 140.9 lb

## 2013-04-14 DIAGNOSIS — I714 Abdominal aortic aneurysm, without rupture, unspecified: Secondary | ICD-10-CM

## 2013-04-14 DIAGNOSIS — I723 Aneurysm of iliac artery: Secondary | ICD-10-CM

## 2013-04-14 DIAGNOSIS — R911 Solitary pulmonary nodule: Secondary | ICD-10-CM

## 2013-04-14 DIAGNOSIS — Z01810 Encounter for preprocedural cardiovascular examination: Secondary | ICD-10-CM

## 2013-04-14 MED ORDER — IOHEXOL 350 MG/ML SOLN
80.0000 mL | Freq: Once | INTRAVENOUS | Status: AC | PRN
  Administered 2013-04-14: 80 mL via INTRAVENOUS

## 2013-04-14 NOTE — Addendum Note (Signed)
Addended by: Dannielle Karvonen on: 04/14/2013 03:57 PM   Modules accepted: Orders

## 2013-04-14 NOTE — Progress Notes (Signed)
[facility]   Patient name: Jordan Mccullough MRN: [id number] DOB: 09/07/1940 Sex: male     Chief Complaint  Patient presents with  . Re-evaluation    6 month f/u carotid/AAA     HISTORY OF PRESENT ILLNESS: The patient is back today for followup of his abdominal aortic aneurysm. He denies back pain or abdominal pain. His aneurysm was found during workup for gross hematuria.  He has a history of coronary artery disease. He had a heart attack and 1992. This was treated with balloon angioplasty. His most recent stress Myoview was in December of 2012 with no evidence of ischemia. He is managed medically for his hypercholesterolemia with a statin. His HDL was 44 and LDL was 1:15.     Past Medical History  Diagnosis Date  . Hyperlipidemia   . Diverticulosis   . Coronary artery disease     MI 1992, S/P  PTCA; negative stress test in November 2011 with no ischemia.   . Peripheral vascular disease 1/14    4.8x4.6 cm infrarenal abdominal aortic fusiform aneurysm, 1.5 cm right common iliac artery aneurysm  . Myocardial infarction   . AAA (abdominal aortic aneurysm)     Past Surgical History  Procedure Laterality Date  . Ptca  1992  . Inguinal heriiorrhaphy bilaterally    . Colonoscopy w/ polypectomy  2003     negative 2010,due 2020; Dr Juanda Chance  . Pilonidal cyst excision    . Rotator cuff repair      Bilateral  . Cystoscopy/retrograde/ureteroscopy Bilateral 10/09/2012    Procedure: BILATERAL RETROGRADE bladder and urethral BIOPSY ;  Surgeon: Milford Cage, MD;  Location: [facility];  Service: Urology;  Laterality: Bilateral;  BILATERAL RETROGRADE  AND bladder and urethral BIOPSY    . Prostate biopsy N/A 10/09/2012    Procedure: PROSTATIC URETHRAL BIOPSY;  Surgeon: Milford Cage, MD;  Location: [facility];  Service: Urology;  Laterality: N/A;  PROSTATIC URETHRAL BIOPSY      History   Social History  . Marital Status: Married    Spouse Name: N/A     Number of Children: N/A  . Years of Education: N/A   Occupational History  . Retired Dentist    Social History Main Topics  . Smoking status: Current Some Day Smoker -- 1.00 packs/day for 20 years    Types: Cigarettes, Cigars    Last Attempt to Quit: 08/21/1990  . Smokeless tobacco: Never Used     Comment: QUIT 15 YEARS AGO  . Alcohol Use: 0.0 oz/week    14-28 Glasses of wine per week  . Drug Use: No  . Sexual Activity: Not on file   Other Topics Concern  . Not on file   Social History Narrative   HEART HEALTHY DIET   RETIRED   MARRIED   FORMER SMOKER QUIT 1992   ALCOHOL USE -YES- RED WINE SOCIALLY   SMOKING -QUIT    Family History  Problem Relation Age of Onset  . Hypertension Mother   . Prostate cancer Father     Prostate cancer  . Cancer Father   . Lung cancer Sister     NON SMOKER  . Cancer Sister   . Hyperlipidemia Sister   . Diabetes Neg Hx   . Stroke Neg Hx   . Hyperlipidemia Brother   . Heart attack Brother   . Hypertension Brother     Allergies as of 04/14/2013  . (No Known Allergies)  Current Outpatient Prescriptions on File Prior to Visit  Medication Sig Dispense Refill  . aspirin 81 MG tablet Take 81 mg by mouth daily.      . Multiple Vitamin (MULTIVITAMIN) tablet Take 1 tablet by mouth daily.      . simvastatin (ZOCOR) 20 MG tablet 2 by mouth at bedtime, LABS DUE 06/2013  60 tablet  2  . hyoscyamine (LEVSIN, ANASPAZ) 0.125 MG tablet Take 1 tablet (0.125 mg total) by mouth every 4 (four) hours as needed for cramping (bladder spasms).  40 tablet  4  . oxybutynin (DITROPAN) 5 MG tablet Take 1 tablet (5 mg total) by mouth every 6 (six) hours as needed (bladder spasms).  40 tablet  4  . oxyCODONE-acetaminophen (PERCOCET) 5-325 MG per tablet Take 1-2 tablets by mouth every 4 (four) hours as needed for pain.  60 tablet  0  . phenazopyridine (PYRIDIUM) 100 MG tablet Take 1 tablet (100 mg total) by mouth every 8 (eight) hours as needed for  pain (Burning urination.  Will turn urine and body fluids orange.).  30 tablet  1  . senna-docusate (SENOKOT S) 8.6-50 MG per tablet Take 1 tablet by mouth 2 (two) times daily.  60 tablet  0   No current facility-administered medications on file prior to visit.     REVIEW OF SYSTEMS: No changes from prior visit  PHYSICAL EXAMINATION:   Vital signs are BP 150/85  Pulse 74  Ht 5\' 5"  (1.651 m)  Wt 140 lb 14.4 oz (63.912 kg)  BMI 23.45 kg/m2  SpO2 100% General: The patient appears their stated age. HEENT:  No gross abnormalities Pulmonary:  Non labored breathing Abdomen: Soft and non-tender. Aneurysm is palpable and nontender Musculoskeletal: There are no major deformities. Neurologic: No focal weakness or paresthesias are detected, Skin: There are no ulcer or rashes noted. Psychiatric: The patient has normal affect. Cardiovascular: There is a regular rate and rhythm without significant murmur appreciated.   Diagnostic Studies CT angiogram shows stable 4.8 cm infrarenal abdominal aortic aneurysm. 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 They originate at the aortic bifurcation. On chest CT scan a left lobe nodule was found concerning for malignancy.  Assessment: Abdominal aortic aneurysm Lung nodule Plan: Abdominal aortic aneurysm: The patient has not had any change in the size of his aneurysm. He remained asymptomatic. I will have him followup in 6 months for repeat ultrasound.  Lung nodule: I discussed this with the patient today. I had planned on getting a PET scan and referring him to the thoracic oncology clinic. The patient once to discuss this with his wife he'll call our office back this afternoon to schedule his appointment  V. Charlena Cross, M.D. [facility] Office: [phone] Pager:  [phone]

## 2013-04-15 ENCOUNTER — Telehealth: Payer: Self-pay | Admitting: *Deleted

## 2013-04-15 NOTE — Telephone Encounter (Signed)
Spoke with pt wife regarding appt for [facility] 04/17/13 at 4:00 arrive at 3:45.  She verbalized understanding of time and place of appt

## 2013-04-17 ENCOUNTER — Ambulatory Visit: Payer: Medicare Other | Admitting: Physical Therapy

## 2013-04-17 ENCOUNTER — Encounter (HOSPITAL_COMMUNITY)
Admission: RE | Admit: 2013-04-17 | Discharge: 2013-04-17 | Disposition: A | Discharge status: Home / Self Care | Payer: Medicare Other | Source: Ambulatory Visit | Attending: Surgery | Admitting: Surgery

## 2013-04-17 ENCOUNTER — Encounter (HOSPITAL_COMMUNITY): Payer: Self-pay

## 2013-04-17 ENCOUNTER — Institutional Professional Consult (permissible substitution) (INDEPENDENT_AMBULATORY_CARE_PROVIDER_SITE_OTHER): Payer: Medicare Other | Admitting: Surgery

## 2013-04-17 VITALS — BP 142/91 | HR 57 | Temp 98.2°F | Resp 18 | Ht 65.0 in | Wt 139.9 lb

## 2013-04-17 DIAGNOSIS — I714 Abdominal aortic aneurysm, without rupture, unspecified: Secondary | ICD-10-CM | POA: Insufficient documentation

## 2013-04-17 DIAGNOSIS — N281 Cyst of kidney, acquired: Secondary | ICD-10-CM | POA: Insufficient documentation

## 2013-04-17 DIAGNOSIS — R911 Solitary pulmonary nodule: Secondary | ICD-10-CM | POA: Insufficient documentation

## 2013-04-17 DIAGNOSIS — K4090 Unilateral inguinal hernia, without obstruction or gangrene, not specified as recurrent: Secondary | ICD-10-CM | POA: Insufficient documentation

## 2013-04-17 DIAGNOSIS — I700 Atherosclerosis of aorta: Secondary | ICD-10-CM | POA: Insufficient documentation

## 2013-04-17 DIAGNOSIS — I2510 Atherosclerotic heart disease of native coronary artery without angina pectoris: Secondary | ICD-10-CM | POA: Insufficient documentation

## 2013-04-17 DIAGNOSIS — K8020 Calculus of gallbladder without cholecystitis without obstruction: Secondary | ICD-10-CM | POA: Insufficient documentation

## 2013-04-17 DIAGNOSIS — I517 Cardiomegaly: Secondary | ICD-10-CM | POA: Insufficient documentation

## 2013-04-17 DIAGNOSIS — N400 Enlarged prostate without lower urinary tract symptoms: Secondary | ICD-10-CM | POA: Insufficient documentation

## 2013-04-17 LAB — GLUCOSE, CAPILLARY: Glucose-Capillary: 99 mg/dL (ref 70–99)

## 2013-04-17 MED ORDER — FLUDEOXYGLUCOSE F - 18 (FDG) INJECTION
17.9000 | Freq: Once | INTRAVENOUS | Status: AC | PRN
  Administered 2013-04-17: 17.9 via INTRAVENOUS

## 2013-04-18 ENCOUNTER — Other Ambulatory Visit: Payer: Self-pay | Admitting: *Deleted

## 2013-04-18 DIAGNOSIS — R911 Solitary pulmonary nodule: Secondary | ICD-10-CM

## 2013-04-19 ENCOUNTER — Encounter: Payer: Self-pay | Admitting: Surgery

## 2013-04-19 NOTE — Progress Notes (Signed)
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 There were enlarged mediastinal and hilar lymph nodes with the largest being a right paratracheal node measuring 10 mm. A PET scan showed the nodule to have an SUV of 2.4 suspicious for bronchogenic carcinoma. There is a 8 mm AP window lymph node with a SUV of 5.0. There is focal hypermetabolism in the left hilum with an SUV of 4.3 that is concerning for lymph node mets. The patient reports that he is asymptomatic.  Past Medical History  Diagnosis Date  . Hyperlipidemia   . Diverticulosis   . Coronary artery disease     MI 1992, S/P  PTCA; negative stress test in November 2011 with no ischemia.   . Peripheral vascular disease 1/14    4.8x4.6 cm infrarenal abdominal aortic fusiform aneurysm, 1.5 cm right common iliac artery aneurysm  . Myocardial infarction   . AAA (abdominal aortic aneurysm)     Past Surgical History  Procedure Laterality Date  . Ptca  1992  . Inguinal heriiorrhaphy bilaterally    . Colonoscopy w/ polypectomy  2003     negative 2010,due 2020; Dr Juanda Chance  . Pilonidal cyst excision    . Rotator cuff repair      Bilateral  . Cystoscopy/retrograde/ureteroscopy Bilateral 10/09/2012    Procedure: BILATERAL RETROGRADE bladder and urethral BIOPSY ;  Surgeon: Milford Cage, MD;  Location: [facility];  Service: Urology;  Laterality: Bilateral;  BILATERAL RETROGRADE  AND bladder and urethral BIOPSY    . Prostate biopsy N/A 10/09/2012    Procedure: PROSTATIC URETHRAL  BIOPSY;  Surgeon: Milford Cage, MD;  Location: [facility];  Service: Urology;  Laterality: N/A;  PROSTATIC URETHRAL BIOPSY      Family History  Problem Relation Age of Onset  . Hypertension Mother   . Prostate cancer Father     Prostate cancer  . Cancer Father   . Lung cancer Sister     NON SMOKER  . Cancer Sister   . Hyperlipidemia Sister   . Diabetes Neg Hx   . Stroke Neg Hx   . Hyperlipidemia Brother   . Heart attack Brother   . Hypertension Brother     Social History History  Substance Use Topics  . Smoking status: Current Some Day Smoker -- 1.00 packs/day for 20 years    Types: Cigarettes, Cigars    Last Attempt to Quit: 08/21/1990  . Smokeless tobacco: Never Used     Comment: QUIT 15 YEARS AGO  . Alcohol Use: 0.0 oz/week    14-28 Glasses of wine per week    Current Outpatient Prescriptions  Medication Sig Dispense Refill  . aspirin 81 MG tablet Take 81 mg by mouth daily.      . hyoscyamine (LEVSIN, ANASPAZ) 0.125 MG tablet Take 1 tablet (0.125 mg total) by mouth every 4 (four) hours as needed for cramping (bladder spasms).  40 tablet  4  . Multiple Vitamin (MULTIVITAMIN) tablet Take 1 tablet by mouth daily.      Marland Kitchen oxybutynin (DITROPAN) 5 MG tablet Take 1 tablet (5 mg total) by mouth every 6 (six) hours as needed (bladder spasms).  40 tablet  4  . oxyCODONE-acetaminophen (PERCOCET) 5-325 MG per tablet Take 1-2 tablets by mouth every 4 (four) hours as needed for pain.  60 tablet  0  . phenazopyridine (PYRIDIUM) 100 MG tablet Take 1 tablet (100 mg total) by mouth every 8 (eight) hours as needed for pain (Burning urination.  Will turn urine and body fluids orange.).  30 tablet  1  . senna-docusate (SENOKOT S) 8.6-50 MG per tablet Take 1 tablet by mouth 2 (two) times daily.  60 tablet  0  . simvastatin (ZOCOR) 20 MG tablet 2 by mouth at bedtime, LABS DUE 06/2013  60 tablet  2   No current facility-administered medications for this visit.    No Known  Allergies  Review of Systems  Constitutional: Positive for unexpected weight change. Negative for fever, chills, activity change, appetite change and fatigue.       Has lost about 10-15 lbs over the past few years.  HENT: Negative.   Eyes: Negative.   Respiratory: Negative.  Negative for cough, chest tightness and shortness of breath.   Cardiovascular: Negative.  Negative for chest pain.  Gastrointestinal: Negative.   Endocrine: Negative.   Genitourinary: Negative.   Musculoskeletal: Negative.   Allergic/Immunologic: Negative.   Neurological: Negative.   Hematological: Negative.   Psychiatric/Behavioral: Negative.     BP 142/91  Pulse 57  Temp(Src) 98.2 F (36.8 C) (Oral)  Resp 18  Ht 5\' 5"  (1.651 m)  Wt 139 lb 14.4 oz (63.458 kg)  BMI 23.28 kg/m2 Physical Exam  Constitutional: He is oriented to person, place, and time. He appears well-developed and well-nourished. No distress.  Smells like cigarettes.  HENT:  Head: Normocephalic and atraumatic.  Mouth/Throat: Oropharynx is clear and moist.  Eyes: Conjunctivae and EOM are normal. Pupils are equal, round, and reactive to light.  Neck: No JVD present. No thyromegaly present.  Cardiovascular: Normal rate, regular rhythm, normal heart sounds and intact distal pulses.   No murmur heard. Pulmonary/Chest: Effort normal and breath sounds normal. No respiratory distress. He has no rales.  Abdominal: Soft. Bowel sounds are normal. He exhibits no distension and no mass. There is no tenderness.  Musculoskeletal: Normal range of motion. He exhibits no edema.  Lymphadenopathy:    He has no cervical adenopathy.  Neurological: He is alert and oriented to person, place, and time. He has normal strength. No cranial nerve deficit or sensory deficit.  Skin: Skin is dry.  Psychiatric: He has a normal mood and affect.     Diagnostic Tests:  *RADIOLOGY REPORT*   Clinical Data:  AAA evaluation   CT ANGIOGRAPHY CHEST, ABDOMEN AND  PELVIS   Technique:  Multidetector CT imaging through the chest, abdomen and pelvis was performed using the standard protocol during bolus administration of intravenous contrast.  Multiplanar reconstructed images including MIPs were obtained and reviewed to evaluate the vascular anatomy.   Contrast: 80mL OMNIPAQUE IOHEXOL 350 MG/ML SOLN,   Comparison:  Prior CT scan of the abdomen and pelvis 08/30/2012   CTA CHEST   Findings:   Mediastinum: Unremarkable CT appearance of the thyroid gland.  No suspicious mediastinal or hilar adenopathy.  Nonspecific, not enlarged by CT criteria mediastinal and hilar lymph nodes may be reactive.  The largest right paratracheal node measures 10 mm in short axis.  No soft tissue mediastinal mass.  The thoracic esophagus is unremarkable.   Heart/Vascular: There is a bovine configuration of the aortic arch (two vessel arch with common origin of the brachiocephalic and left common carotid arteries), a normal anatomic variant.  No aneurysmal dilatation, intramural hematoma or dissection.  The descending thoracic aorta is tortuous.  Scattered atherosclerotic vascular calcifications are noted without significant stenosis.  The heart is within normal limits for size.  There are atherosclerotic calcifications noted in the left anterior descending, left main, circumflex and right coronary arteries.  No pericardial effusion.   Lungs/ the Pleura: Trace centrilobular emphysematous changes noted in the apices.  Irregular mildly spiculated nodule in the periphery of the left upper lobe measures approximately 11 x 12 mm in greatest dimension (best measured on sagittal and coronal reformats. There is an adjacent small calcified subpleural lymph node or granuloma.  No additional suspicious pulmonary nodules identified.   Bones: Soft tissue anchors in the left humeral head suggest prior rotator cuff repair. No acute fracture or aggressive appearing lytic or  blastic osseous lesion.  Prominent Schmorl's node versus minimal compression fracture of the T8 vertebral body.  The overall heart degenerative disc disease are relatively mild severity.    Review of the MIP images confirms the above findings.   IMPRESSION:   1.  Spiculated 11 x 12 mm peripheral left upper lobe pulmonary nodule is concerning for primary bronchogenic carcinoma.  Recommend further evaluation with PET CT.   2.  Trace biapical emphysematous changes.   3.  Atherosclerosis including the left main and three-vessel coronary artery disease.   These results were called by telephone on 04/14/2013 at 10:15 a.m. to Dr. Myra Gianotti, who verbally acknowledged these results.   CTA ABDOMEN AND PELVIS   Findings:   VASCULAR   Aorta: Fusiform aneurysmal dilatation of the infrarenal abdominal aorta with a maximal diameter of 4.8 x 4.6 cm.  There is crescentic smooth wall adherent thrombus.   Celiac: Widely patent.  Conventional hepatic arterial anatomy.   SMA: Widely patent.   Renals: One left and three-view right sided renal arteries.  The two lower pole accessory renal arteries arise from the distal aorta just proximal to the bifurcation and distal to the aneurysmal segment.  The vessel supplied approximately 25% of the overall renal parenchyma.  Mild atherosclerotic calcification at the origins without significant stenosis.  No changes of fibromuscular dysplasia.   IMA: Widely patent.   Inflow: Ectatic common iliac arteries with scattered atherosclerotic calcifications but no focal stenosis.  The right common iliac artery measures up to 1.5 cm.  The left common iliac artery measures up to 1.7 cm.  There is a small penetrating ulcer arising off the anterior and inferior aspect of the left common iliac artery.  Bilateral hypogastric arteries are patent. Fusiform ectasia of the left hypogastric artery shortly before the bifurcation measuring up to 1.3 cm.  The external  iliac arteries demonstrate mild atherosclerotic vascular disease without significant stenosis.   Proximal Outflow: Calcified plaque along the posterior wall of the bilateral common femoral arteries without significant stenosis. The visualized superficial profunda femoral arteries are widely patent.   Veins: No focal venous abnormality detected.   NON-VASCULAR   Abdomen:  Unremarkable CT appearance of the stomach, duodenum, spleen, adrenal glands, pancreas and liver.  There is a solitary nonspecific sub centimeter hypoattenuating lesion in the mid right hepatic lobe which is too small to accurately  characterize but highly likely a benign cyst.  Cholelithiasis without acute cholecystitis.   Multiple renal cortical cysts bilaterally.  No enhancing renal mass.  No hydronephrosis or nephrolithiasis.  Extrarenal pelvis on the right.   Normal-caliber large and small bowel throughout the abdomen.  No evidence of bowel obstruction or focal bowel wall thickening. Scattered colonic diverticula without evidence of active inflammation.  Normal appendix in the right lower quadrant.  No free fluid or suspicious adenopathy.   Pelvis: Small bilateral fat containing inguinal hernias. The prostate gland is enlarged at 5.8 cm in transverse diameter.  The bladder is distended and mildly thick-walled.  No free fluid or suspicious adenopathy.   Bones: No acute fracture or aggressive appearing lytic or blastic osseous lesion.  Mild multilevel degenerative disc disease.    Review of the MIP images confirms the above findings.   IMPRESSION:   1.  Fusiform infrarenal abdominal aortic aneurysm with maximal dimensions of 4.8 x 4.6 cm.   2.  Ectatic bilateral common iliac and left internal iliac arteries as detailed above.   3.  There are three right sided renal arteries including two lower pole accessory arteries which arise off the distal aorta just proximal to the bifurcation.  These accessory  arteries supply approximately 25% above the overall right renal parenchymal volume.   4.  Cholelithiasis.   5.  Diverticulosis.   6.  Bilateral fat containing inguinal hernias.   7.  Prostatomegaly with enlarged and trabeculated bladder suggestive of bladder outlet obstruction.   These results were called by telephone on 04/14/2013 at 10:15 a.m. to Dr. Myra Gianotti, who verbally acknowledged these results.     Original Report Authenticated By: Malachy Moan, M.D.         *RADIOLOGY REPORT*   Clinical Data: Initial treatment strategy for solitary pulmonary nodule.   NUCLEAR MEDICINE PET SKULL BASE TO THIGH   Fasting Blood Glucose:  99   Technique:  17.9 mCi F-18 FDG was injected intravenously. CT data was obtained and used for attenuation correction and anatomic localization only.  (This was not acquired as a diagnostic CT examination.) Additional exam technical data entered on technologist worksheet.   Comparison:  CTA chest abdomen pelvis dated 04/14/2013   Findings:   Neck: No hypermetabolic lymph nodes in the neck.   Chest:  Spiculated 11 x 7 mm nodule in the lateral left upper lobe (series 2/image 67), max SUV 2.4, suspicious for primary bronchogenic neoplasm.   8 mm short-axis AP window node (series 2/image 70), max SUV 5.0, suspicious for nodal metastasis.   Focal hypermetabolism in the left hilar region, max SUV 4.3 (PET image 74), worrisome for additional metastasis but without correlate on unenhanced CT.   Cardiomegaly.  Coronary atherosclerosis.   Abdomen/Pelvis:  No abnormal hypermetabolic activity within the liver, pancreas, adrenal glands, or spleen.   No hypermetabolic lymph nodes in the abdomen or pelvis.   4.9 cm infrarenal abdominal aortic aneurysm. Atherosclerotic calcifications of the abdominal aorta and branch vessels.   Cholelithiasis, without associated inflammatory changes.  Left renal cysts.   Prostatomegaly.  Thick-walled  bladder, possibly reflecting chronic bladder outlet obstruction.   Small fat-containing left inguinal hernia.  Small fat-containing right inguinal hernia with mild soft tissue stranding and associated hypermetabolism, max SUV 4.0 (PET image 197).   Skeleton:  No focal hypermetabolic activity to suggest skeletal metastasis.   IMPRESSION:   Spiculated 11 mm nodule in the lateral left upper lobe, max SUV 2.4, suspicious for primary bronchogenic neoplasm.  Suspected left hilar and AP window nodal metastases.   Mild hypermetabolism/stranding within a right inguinal hernia, of uncertain significance, but likely unrelated to malignancy.    Additional ancillary findings as above.     Original Report Authenticated By: Charline Bills, M.D.     Impression:  He has a suspicious hypermetabolic nodule in the left upper lobe with hypermetabolic lymphadenopathy in the left hilum and AP window. There are also enlarged paratracheal lymph nodes although they are not hypermetabolic. I suspect that he has a bronchogenic carcinoma with lymph node mets that would make it a stage III tumor if the AP window or mediastinal nodes are positive. I think bronchoscopy and mediastinoscopy is indicated to completely assess the mediastinal lymph nodes. If they are negative I would consider surgical resection depending on his PFT's. He may still have hilar lymph node mets but I don't see any discrete enlarged lymph nodes that I could biopsy with EBUS. I reviewed the CT and PET scans with him and his wife, discussed my impression and recommendation and he would like to think about it and will call me office later if he decides to proceed.   Plan:  He will call my office to schedule flexible video bronchoscopy and mediastinoscopy if he decides to proceed with further workup. We will schedule PFT's with diffusion capacity at that time.

## 2013-04-21 HISTORY — PX: OTHER SURGICAL HISTORY: SHX169

## 2013-04-22 ENCOUNTER — Other Ambulatory Visit: Payer: Self-pay

## 2013-04-22 DIAGNOSIS — D381 Neoplasm of uncertain behavior of trachea, bronchus and lung: Secondary | ICD-10-CM

## 2013-04-24 ENCOUNTER — Encounter (HOSPITAL_COMMUNITY): Payer: Medicare Other

## 2013-04-24 ENCOUNTER — Encounter (HOSPITAL_COMMUNITY): Payer: Self-pay | Admitting: Pharmacy Technician

## 2013-04-29 ENCOUNTER — Ambulatory Visit (HOSPITAL_COMMUNITY)
Admission: RE | Admit: 2013-04-29 | Discharge: 2013-04-29 | Disposition: A | Discharge status: Home / Self Care | Payer: Medicare Other | Source: Ambulatory Visit | Attending: Surgery | Admitting: Surgery

## 2013-04-29 ENCOUNTER — Encounter (HOSPITAL_COMMUNITY): Payer: Self-pay

## 2013-04-29 ENCOUNTER — Encounter (HOSPITAL_COMMUNITY)
Admission: RE | Admit: 2013-04-29 | Discharge: 2013-04-29 | Disposition: A | Discharge status: Home / Self Care | Payer: Medicare Other | Source: Ambulatory Visit | Attending: Surgery | Admitting: Surgery

## 2013-04-29 VITALS — BP 144/80 | HR 74 | Temp 98.6°F | Resp 18 | Wt 142.6 lb

## 2013-04-29 DIAGNOSIS — R911 Solitary pulmonary nodule: Secondary | ICD-10-CM | POA: Insufficient documentation

## 2013-04-29 DIAGNOSIS — I714 Abdominal aortic aneurysm, without rupture, unspecified: Secondary | ICD-10-CM | POA: Insufficient documentation

## 2013-04-29 DIAGNOSIS — R948 Abnormal results of function studies of other organs and systems: Secondary | ICD-10-CM | POA: Insufficient documentation

## 2013-04-29 DIAGNOSIS — Z01811 Encounter for preprocedural respiratory examination: Secondary | ICD-10-CM | POA: Insufficient documentation

## 2013-04-29 DIAGNOSIS — Z01818 Encounter for other preprocedural examination: Secondary | ICD-10-CM | POA: Insufficient documentation

## 2013-04-29 DIAGNOSIS — Z01812 Encounter for preprocedural laboratory examination: Secondary | ICD-10-CM | POA: Insufficient documentation

## 2013-04-29 DIAGNOSIS — I2510 Atherosclerotic heart disease of native coronary artery without angina pectoris: Secondary | ICD-10-CM | POA: Insufficient documentation

## 2013-04-29 DIAGNOSIS — D381 Neoplasm of uncertain behavior of trachea, bronchus and lung: Secondary | ICD-10-CM

## 2013-04-29 DIAGNOSIS — I252 Old myocardial infarction: Secondary | ICD-10-CM | POA: Insufficient documentation

## 2013-04-29 DIAGNOSIS — Z01810 Encounter for preprocedural cardiovascular examination: Secondary | ICD-10-CM | POA: Insufficient documentation

## 2013-04-29 DIAGNOSIS — I739 Peripheral vascular disease, unspecified: Secondary | ICD-10-CM | POA: Insufficient documentation

## 2013-04-29 DIAGNOSIS — R599 Enlarged lymph nodes, unspecified: Secondary | ICD-10-CM | POA: Insufficient documentation

## 2013-04-29 DIAGNOSIS — E785 Hyperlipidemia, unspecified: Secondary | ICD-10-CM | POA: Insufficient documentation

## 2013-04-29 HISTORY — DX: Unspecified hearing loss, unspecified ear: H91.90

## 2013-04-29 LAB — COMPREHENSIVE METABOLIC PANEL
ALT: 81 U/L — ABNORMAL HIGH (ref 0–53)
AST: 52 U/L — ABNORMAL HIGH (ref 0–37)
Albumin: 3.9 g/dL (ref 3.5–5.2)
Alkaline Phosphatase: 53 U/L (ref 39–117)
BUN: 10 mg/dL (ref 6–23)
CO2: 25 mEq/L (ref 19–32)
Calcium: 9.5 mg/dL (ref 8.4–10.5)
Chloride: 104 mEq/L (ref 96–112)
Creatinine, Ser: 0.62 mg/dL (ref 0.50–1.35)
GFR calc Af Amer: >90 mL/min (ref >90)
GFR calc non Af Amer: >90 mL/min (ref >90)
Glucose, Bld: 108 mg/dL — ABNORMAL HIGH (ref 70–99)
Potassium: 4.3 mEq/L (ref 3.5–5.1)
Sodium: 140 mEq/L (ref 135–145)
Total Bilirubin: 0.3 mg/dL (ref 0.3–1.2)
Total Protein: 7.5 g/dL (ref 6.0–8.3)

## 2013-04-29 LAB — CBC
HCT: 43.8 % (ref 39.0–52.0)
Hemoglobin: 15.7 g/dL (ref 13.0–17.0)
MCH: 32 pg (ref 26.0–34.0)
MCHC: 35.8 g/dL (ref 30.0–36.0)
MCV: 89.2 fL (ref 78.0–100.0)
Platelets: 224 10*3/uL (ref 150–400)
RBC: 4.91 MIL/uL (ref 4.22–5.81)
RDW: 14.6 % (ref 11.5–15.5)
WBC: 5.8 10*3/uL (ref 4.0–10.5)

## 2013-04-29 LAB — PROTIME-INR
INR: 0.95 (ref 0.00–1.49)
Prothrombin Time: 12.5 seconds (ref 11.6–15.2)

## 2013-04-29 LAB — PULMONARY FUNCTION TEST

## 2013-04-29 LAB — ABO/RH: ABO/RH(D): A POS

## 2013-04-29 LAB — TYPE AND SCREEN
ABO/RH(D): A POS
Antibody Screen: NEGATIVE

## 2013-04-29 LAB — APTT: aPTT: 23 seconds — ABNORMAL LOW (ref 24–37)

## 2013-04-29 MED ORDER — ALBUTEROL SULFATE (5 MG/ML) 0.5% IN NEBU
2.5000 mg | INHALATION_SOLUTION | Freq: Once | RESPIRATORY_TRACT | Status: AC
  Administered 2013-04-29: 2.5 mg via RESPIRATORY_TRACT

## 2013-04-29 NOTE — Progress Notes (Signed)
Anesthesia Chart Review:  Patient is a 72 year old male posted for mediastinoscopy, video bronchoscopy on 05/01/13 by Dr. Laneta Simmers.  Patient has a known AAA and underwent CTA of the chest and abdomen on 04/14/13 that showed a stable 4.8 cm infrarenal AAA but a spiculated LUL nodule.  Other history includes smoking, CAD/MI '92 s/p PTCA, hearing loss, diverticulosis, HLD, bilateral inguinal hernia repair, prostate and bladder biopsies 10/09/12. PCP is Dr. Marga Melnick. Cardiologist is Dr. Verne Carrow, last visit 07/10/12.  Echo on 07/26/12 showed: Normal LV size with mild systolic dysfunction, EF 45%. There was basal to mid posterior and inferior moderate to severe hypokinesis. Grade 1 diastolic dysfunction. Normal RV size and systolic function. No significant valvular abnormalities (trivial MR). Mildly dilated aortic root 39 mm.   Stress myoview 08/04/11 with no ischemia, diffuse hypokinesis, LVEF=45%. Exercise stress test 06/25/12 and he exercised for 9 minutes of the Bruce protocol and he did well with no chest pain, SOB, fatigue. No EKG changes to suggest ischemia. He did have PVCs during exercise and in recovery.   EKG on 04/29/13 showed SR with occasional PVC's, LAFB.  Carotid duplex on 04/14/13 showed no significant bilateral ICA stenosis.  CXR on 04/29/13 showed: Subtle spiculated density in the lateral left upper lobe correlating to the PET CT finding presumably the patient's cancer. No acute abnormality identified.   Preoperative labs noted.  AST 52, ALT 81--newly elevated since 06/2012.  He is on statin therapy and does have history of ETOH. AST/ALT are elevated but less than 2X normal and PLT and coags are unremarkable, so I will not plan to repeat labs prior to surgery.  Patient has had cardiology follow-up and exercise and nuclear stress testing within the past two years.  He has tolerated surgery earlier this year.  If no acute changes then I would anticipate that he could proceed as  planned with this procedure.  Velna [facility]/Anesthesiology Phone [phone] 04/29/2013 3:55 PM

## 2013-04-29 NOTE — Pre-Procedure Instructions (Signed)
[name]  04/29/2013   Your procedure is scheduled on:  Thursday, September 11th.  Report to [facility] at 5:30 AM.  Call this number if you have problems the morning of surgery: [phone]   Remember:   Do not eat food or drink liquids after midnight.   Take these medicines the morning of surgery with A SIP OF WATER: None   Do not wear jewelry, make-up or nail polish.  Do not wear lotions, powders, or perfumes. You may wear deodorant.  Do not shave 48 hours prior to surgery. Men may shave face and neck.  Do not bring valuables to the hospital.  [facility] is not responsibe for any belongings or valuables.  Contacts, dentures or bridgework may not be worn into surgery.  Leave suitcase in the car. After surgery it may be brought to your room.  For patients admitted to the hospital, checkout time is 11:00 AM the day of discharge.   Patients discharged the day of surgery will not be allowed to drive home.  Name and phone number of your driver:              Special Instructions: Shower using CHG 2 nights before surgery and the night before surgery.  If you shower the day of surgery use CHG.  Use special wash - you have one bottle of CHG for all showers.  You should use approximately 1/3 of the bottle for each shower. N/A   Please read over the following fact sheets that you were given: Pain Booklet, Coughing and Deep Breathing and Surgical Site Infection Prevention

## 2013-04-30 MED ORDER — DEXTROSE 5 % IV SOLN
1.5000 g | INTRAVENOUS | Status: AC
  Administered 2013-05-01: 1.5 g via INTRAVENOUS
  Filled 2013-04-30: qty 1.5

## 2013-05-01 ENCOUNTER — Encounter (HOSPITAL_COMMUNITY)
Admission: RE | Disposition: A | Discharge status: Home / Self Care | Payer: Self-pay | Source: Ambulatory Visit | Attending: Surgery

## 2013-05-01 ENCOUNTER — Ambulatory Visit (HOSPITAL_COMMUNITY)
Admission: RE | Admit: 2013-05-01 | Discharge: 2013-05-01 | Disposition: A | Discharge status: Home / Self Care | Payer: Medicare Other | Source: Ambulatory Visit | Attending: Surgery | Admitting: Surgery

## 2013-05-01 ENCOUNTER — Ambulatory Visit (HOSPITAL_COMMUNITY): Payer: Medicare Other

## 2013-05-01 ENCOUNTER — Encounter (HOSPITAL_COMMUNITY): Payer: Self-pay | Admitting: Vascular Surgery

## 2013-05-01 ENCOUNTER — Ambulatory Visit (HOSPITAL_COMMUNITY): Payer: Medicare Other | Admitting: Anesthesiology

## 2013-05-01 ENCOUNTER — Encounter (HOSPITAL_COMMUNITY): Payer: Self-pay | Admitting: *Deleted

## 2013-05-01 DIAGNOSIS — D381 Neoplasm of uncertain behavior of trachea, bronchus and lung: Secondary | ICD-10-CM

## 2013-05-01 HISTORY — PX: VIDEO BRONCHOSCOPY: SHX5072

## 2013-05-01 HISTORY — PX: MEDIASTINOSCOPY: SHX5086

## 2013-05-01 SURGERY — MEDIASTINOSCOPY
Anesthesia: General | Site: Neck | Wound class: Clean Contaminated

## 2013-05-01 MED ORDER — LIDOCAINE HCL (CARDIAC) 20 MG/ML IV SOLN
INTRAVENOUS | Status: DC | PRN
  Administered 2013-05-01: 100 mg via INTRAVENOUS

## 2013-05-01 MED ORDER — ALBUMIN HUMAN 5 % IV SOLN
INTRAVENOUS | Status: AC
  Filled 2013-05-01: qty 250

## 2013-05-01 MED ORDER — ALBUMIN HUMAN 5 % IV SOLN
12.5000 g | Freq: Once | INTRAVENOUS | Status: AC
  Administered 2013-05-01: 12.5 g via INTRAVENOUS

## 2013-05-01 MED ORDER — FENTANYL CITRATE 0.05 MG/ML IJ SOLN
INTRAMUSCULAR | Status: DC | PRN
  Administered 2013-05-01: 50 ug via INTRAVENOUS
  Administered 2013-05-01 (×2): 25 ug via INTRAVENOUS

## 2013-05-01 MED ORDER — ROCURONIUM BROMIDE 100 MG/10ML IV SOLN
INTRAVENOUS | Status: DC | PRN
  Administered 2013-05-01: 40 mg via INTRAVENOUS

## 2013-05-01 MED ORDER — OXYCODONE HCL 5 MG/5ML PO SOLN: 5.0000 mg | Freq: Once | ORAL | Status: DC | PRN

## 2013-05-01 MED ORDER — ONDANSETRON HCL 4 MG/2ML IJ SOLN
INTRAMUSCULAR | Status: DC | PRN
  Administered 2013-05-01: 4 mg via INTRAVENOUS

## 2013-05-01 MED ORDER — PHENYLEPHRINE HCL 10 MG/ML IJ SOLN
10.0000 mg | INTRAVENOUS | Status: DC | PRN
  Administered 2013-05-01: 25 ug/min via INTRAVENOUS

## 2013-05-01 MED ORDER — ARTIFICIAL TEARS OP OINT
TOPICAL_OINTMENT | OPHTHALMIC | Status: DC | PRN
  Administered 2013-05-01: 1 via OPHTHALMIC

## 2013-05-01 MED ORDER — PROMETHAZINE HCL 25 MG/ML IJ SOLN: 6.2500 mg | INTRAMUSCULAR | Status: DC | PRN

## 2013-05-01 MED ORDER — HYDROMORPHONE HCL PF 1 MG/ML IJ SOLN: 0.2500 mg | INTRAMUSCULAR | Status: DC | PRN

## 2013-05-01 MED ORDER — OXYCODONE HCL 5 MG PO TABS: 5.0000 mg | ORAL_TABLET | ORAL | Status: DC | PRN

## 2013-05-01 MED ORDER — PROPOFOL 10 MG/ML IV BOLUS
INTRAVENOUS | Status: DC | PRN
  Administered 2013-05-01: 180 mg via INTRAVENOUS

## 2013-05-01 MED ORDER — MIDAZOLAM HCL 5 MG/5ML IJ SOLN
INTRAMUSCULAR | Status: DC | PRN
  Administered 2013-05-01: 1 mg via INTRAVENOUS

## 2013-05-01 MED ORDER — NEOSTIGMINE METHYLSULFATE 1 MG/ML IJ SOLN
INTRAMUSCULAR | Status: DC | PRN
  Administered 2013-05-01: 3 mg via INTRAVENOUS

## 2013-05-01 MED ORDER — OXYCODONE HCL 5 MG PO TABS: 5.0000 mg | ORAL_TABLET | Freq: Once | ORAL | Status: DC | PRN

## 2013-05-01 MED ORDER — GLYCOPYRROLATE 0.2 MG/ML IJ SOLN
INTRAMUSCULAR | Status: DC | PRN
  Administered 2013-05-01: 0.4 mg via INTRAVENOUS
  Administered 2013-05-01: 0.2 mg via INTRAVENOUS

## 2013-05-01 MED ORDER — HEMOSTATIC AGENTS (NO CHARGE) OPTIME
TOPICAL | Status: DC | PRN
  Administered 2013-05-01: 1 via TOPICAL

## 2013-05-01 MED ORDER — LACTATED RINGERS IV SOLN
INTRAVENOUS | Status: DC | PRN
  Administered 2013-05-01 (×2): via INTRAVENOUS

## 2013-05-01 MED ORDER — 0.9 % SODIUM CHLORIDE (POUR BTL) OPTIME
TOPICAL | Status: DC | PRN
  Administered 2013-05-01 (×2): 1000 mL

## 2013-05-01 SURGICAL SUPPLY — 50 items
BLADE SURG 15 STRL LF DISP TIS (BLADE) ×1 IMPLANT
BLADE SURG 15 STRL SS (BLADE) ×1
BRUSH CYTOL CELLEBRITY 1.5X140 (MISCELLANEOUS) IMPLANT
CANISTER SUCTION 2500CC (MISCELLANEOUS) ×2 IMPLANT
CLIP TI MEDIUM 6 (CLIP) ×2 IMPLANT
CLIP TI WIDE RED SMALL 6 (CLIP) ×2 IMPLANT
CLOTH BEACON ORANGE TIMEOUT ST (SAFETY) ×2 IMPLANT
CONT SPEC 4OZ CLIKSEAL STRL BL (MISCELLANEOUS) ×4 IMPLANT
COTTONBALL LRG STERILE PKG (GAUZE/BANDAGES/DRESSINGS) IMPLANT
COVER SURGICAL LIGHT HANDLE (MISCELLANEOUS) ×4 IMPLANT
COVER TABLE BACK 60X90 (DRAPES) ×2 IMPLANT
DERMABOND ADVANCED (GAUZE/BANDAGES/DRESSINGS) ×1
DERMABOND ADVANCED .7 DNX12 (GAUZE/BANDAGES/DRESSINGS) ×1 IMPLANT
DRAPE LAPAROTOMY T 102X78X121 (DRAPES) ×2 IMPLANT
ELECT CAUTERY BLADE 6.4 (BLADE) ×2 IMPLANT
ELECT REM PT RETURN 9FT ADLT (ELECTROSURGICAL) ×2
ELECTRODE REM PT RTRN 9FT ADLT (ELECTROSURGICAL) ×1 IMPLANT
FORCEPS BIOP RJ4 1.8 (CUTTING FORCEPS) IMPLANT
GLOVE EUDERMIC 7 POWDERFREE (GLOVE) ×2 IMPLANT
GLOVE SURG SS PI 7.5 STRL IVOR (GLOVE) ×2 IMPLANT
GOWN PREVENTION PLUS XLARGE (GOWN DISPOSABLE) ×2 IMPLANT
GOWN STRL NON-REIN LRG LVL3 (GOWN DISPOSABLE) ×4 IMPLANT
HEMOSTAT SURGICEL 2X14 (HEMOSTASIS) ×2 IMPLANT
KIT BASIN OR (CUSTOM PROCEDURE TRAY) ×2 IMPLANT
KIT ROOM TURNOVER OR (KITS) ×2 IMPLANT
MARKER SKIN DUAL TIP RULER LAB (MISCELLANEOUS) ×2 IMPLANT
NEEDLE 22X1 1/2 (OR ONLY) (NEEDLE) IMPLANT
NEEDLE BIOPSY TRANSBRONCH 21G (NEEDLE) IMPLANT
NS IRRIG 1000ML POUR BTL (IV SOLUTION) ×4 IMPLANT
OIL SILICONE PENTAX (PARTS (SERVICE/REPAIRS)) ×2 IMPLANT
PACK SURGICAL SETUP 50X90 (CUSTOM PROCEDURE TRAY) ×2 IMPLANT
PAD ARMBOARD 7.5X6 YLW CONV (MISCELLANEOUS) ×4 IMPLANT
PENCIL BUTTON HOLSTER BLD 10FT (ELECTRODE) ×2 IMPLANT
SPONGE GAUZE 4X4 12PLY (GAUZE/BANDAGES/DRESSINGS) ×2 IMPLANT
SPONGE INTESTINAL PEANUT (DISPOSABLE) ×2 IMPLANT
SUT SILK 2 0 TIES 10X30 (SUTURE) IMPLANT
SUT VIC AB 2-0 CT1 27 (SUTURE) ×1
SUT VIC AB 2-0 CT1 TAPERPNT 27 (SUTURE) ×1 IMPLANT
SUT VIC AB 3-0 SH 27 (SUTURE)
SUT VIC AB 3-0 SH 27X BRD (SUTURE) IMPLANT
SUT VICRYL 4-0 PS2 18IN ABS (SUTURE) ×2 IMPLANT
SYR 20ML ECCENTRIC (SYRINGE) ×2 IMPLANT
SYR 5ML LUER SLIP (SYRINGE) ×2 IMPLANT
SYR CONTROL 10ML LL (SYRINGE) IMPLANT
SYRINGE 10CC LL (SYRINGE) ×2 IMPLANT
TOWEL OR 17X24 6PK STRL BLUE (TOWEL DISPOSABLE) ×4 IMPLANT
TOWEL OR 17X26 10 PK STRL BLUE (TOWEL DISPOSABLE) ×2 IMPLANT
TRAP SPECIMEN MUCOUS 40CC (MISCELLANEOUS) ×2 IMPLANT
TUBE CONNECTING 12X1/4 (SUCTIONS) ×2 IMPLANT
WATER STERILE IRR 1000ML POUR (IV SOLUTION) ×2 IMPLANT

## 2013-05-01 NOTE — Anesthesia Postprocedure Evaluation (Signed)
Anesthesia Post Note  Patient: Jordan Mccullough  Procedure(s) Performed: Procedure(s) (LRB): MEDIASTINOSCOPY (N/A) VIDEO BRONCHOSCOPY (N/A)  Anesthesia type: general  Patient location: PACU  Post pain: Pain level controlled  Post assessment: Patient's Cardiovascular Status Stable  Last Vitals:  Filed Vitals:   05/01/13 1059  BP: 123/71  Pulse: 62  Temp: 36.6 C  Resp:     Post vital signs: Reviewed and stable  Level of consciousness: sedated  Complications: No apparent anesthesia complications

## 2013-05-01 NOTE — Progress Notes (Signed)
See progress notes for bp issues

## 2013-05-01 NOTE — Anesthesia Preprocedure Evaluation (Addendum)
Anesthesia Evaluation  Patient identified by MRN, date of birth, ID band Patient awake    Reviewed: Allergy & Precautions, H&P , NPO status , Patient's Chart, lab work & pertinent test results  Airway Mallampati: II TM Distance: >3 FB Neck ROM: Full    Dental  (+) Teeth Intact and Dental Advisory Given   Pulmonary Current Smoker,    Pulmonary exam normal       Cardiovascular + CAD, + Past MI and + Peripheral Vascular Disease  MI 1992, S/P  PTCA; negative stress test in November 2011 with no ischemia.   Neuro/Psych negative neurological ROS  negative psych ROS   GI/Hepatic negative GI ROS, Neg liver ROS,   Endo/Other  negative endocrine ROS  Renal/GU negative Renal ROS     Musculoskeletal   Abdominal   Peds  Hematology   Anesthesia Other Findings   Reproductive/Obstetrics                          Anesthesia Physical Anesthesia Plan  ASA: III  Anesthesia Plan: General   Post-op Pain Management:    Induction: Intravenous  Airway Management Planned: Oral ETT  Additional Equipment:   Intra-op Plan:   Post-operative Plan: Extubation in OR  Informed Consent: I have reviewed the patients History and Physical, chart, labs and discussed the procedure including the risks, benefits and alternatives for the proposed anesthesia with the patient or authorized representative who has indicated his/her understanding and acceptance.   Dental advisory given  Plan Discussed with: CRNA, Anesthesiologist and Surgeon  Anesthesia Plan Comments:        Anesthesia Quick Evaluation

## 2013-05-01 NOTE — Progress Notes (Signed)
pts bp remains 89/51 consulteed with dr singer orders obtained and carried out

## 2013-05-01 NOTE — Preoperative (Signed)
Beta Blockers   Reason not to administer Beta Blockers:Not Applicable 

## 2013-05-01 NOTE — Progress Notes (Signed)
pts bp 68/41 dr singer notifed orders obtained and carried out

## 2013-05-01 NOTE — Brief Op Note (Signed)
05/01/2013  9:37 AM  PATIENT:  Jordan Mccullough  72 y.o. male  PRE-OPERATIVE DIAGNOSIS:  Left upper lobe lung mass  POST-OPERATIVE DIAGNOSIS:  Left upper lobe lung mass  PROCEDURE:  Procedure(s): MEDIASTINOSCOPY (N/A) VIDEO BRONCHOSCOPY (N/A)  SURGEON:  Surgeon(s) and Role:    * Alleen Borne, MD - Primary  PHYSICIAN ASSISTANT: none  ASSISTANTS: none   ANESTHESIA:   general  EBL:  Total I/O In: 1400 [I.V.:1400] Out: 150 [Blood:150]  BLOOD ADMINISTERED:none  DRAINS: none   LOCAL MEDICATIONS USED:  NONE  SPECIMEN:  Source of Specimen:  lymph node biopsies from 4R, 10R, 7, 4L, 10L  DISPOSITION OF SPECIMEN:  PATHOLOGY  COUNTS:  YES  TOURNIQUET:  * No tourniquets in log *  DICTATION: .Note written in EPIC  PLAN OF CARE: Discharge to home after PACU  PATIENT DISPOSITION:  PACU - hemodynamically stable.   Delay start of Pharmacological VTE agent (>24hrs) due to surgical blood loss or risk of bleeding: not applicable

## 2013-05-01 NOTE — Interval H&P Note (Signed)
History and Physical Interval Note:  05/01/2013 7:51 AM  Jordan Mccullough  has presented today for surgery, with the diagnosis of Left upper lobe lung mass  The various methods of treatment have been discussed with the patient and family. After consideration of risks, benefits and other options for treatment, the patient has consented to  Procedure(s): MEDIASTINOSCOPY (N/A) VIDEO BRONCHOSCOPY (N/A) as a surgical intervention .  The patient's history has been reviewed, patient examined, no change in status, stable for surgery.  I have reviewed the patient's chart and labs.  Questions were answered to the patient's satisfaction.     Alleen Borne

## 2013-05-01 NOTE — H&P (Signed)
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 There were enlarged mediastinal and hilar lymph nodes with the largest being a right paratracheal node measuring 10 mm. A PET scan showed the nodule to have an SUV of 2.4 suspicious for bronchogenic carcinoma. There is a 8 mm AP window lymph node with a SUV of 5.0. There is focal hypermetabolism in the left hilum with an SUV of 4.3 that is concerning for lymph node mets. The patient reports that he is asymptomatic.    Past Medical History   Diagnosis  Date   .  Hyperlipidemia     .  Diverticulosis     .  Coronary artery disease         MI 1992, S/P  PTCA; negative stress test in November 2011 with no ischemia.    .  Peripheral vascular disease  1/14       4.8x4.6 cm infrarenal abdominal aortic fusiform aneurysm, 1.5 cm right common iliac artery aneurysm   .  Myocardial infarction     .  AAA (abdominal aortic aneurysm)           Past Surgical History   Procedure  Laterality  Date   .  Ptca    1992   .  Inguinal heriiorrhaphy bilaterally       .  Colonoscopy w/ polypectomy    2003        negative 2010,due 2020; Dr Juanda Chance   .  Pilonidal cyst excision       .  Rotator cuff repair           Bilateral   .  Cystoscopy/retrograde/ureteroscopy  Bilateral  10/09/2012       Procedure: BILATERAL RETROGRADE bladder and urethral BIOPSY ;  Surgeon: Milford Cage, MD;  Location: [facility];  Service: Urology;  Laterality: Bilateral;  BILATERAL RETROGRADE  AND bladder and urethral BIOPSY      .  Prostate biopsy  N/A  10/09/2012       Procedure: PROSTATIC URETHRAL BIOPSY;   Surgeon: Milford Cage, MD;  Location: [facility];  Service: Urology;  Laterality: N/A;  PROSTATIC URETHRAL BIOPSY            Family History   Problem  Relation  Age of Onset   .  Hypertension  Mother     .  Prostate cancer  Father         Prostate cancer   .  Cancer  Father     .  Lung cancer  Sister         NON SMOKER   .  Cancer  Sister     .  Hyperlipidemia  Sister     .  Diabetes  Neg Hx     .  Stroke  Neg Hx     .  Hyperlipidemia  Brother     .  Heart attack  Brother     .  Hypertension  Brother          Social History History   Substance Use Topics   .  Smoking status:  Current Some Day Smoker -- 1.00 packs/day for 20 years       Types:  Cigarettes, Cigars       Last Attempt to Quit:  08/21/1990   .  Smokeless tobacco:  Never Used         Comment: QUIT 15 YEARS AGO   .  Alcohol Use:  0.0 oz/week       14-28 Glasses of wine per week         Current Outpatient Prescriptions   Medication  Sig  Dispense  Refill   .  aspirin 81 MG tablet  Take 81 mg by mouth daily.         .  hyoscyamine (LEVSIN, ANASPAZ) 0.125 MG tablet  Take 1 tablet (0.125 mg total) by mouth every 4 (four) hours as needed for cramping (bladder spasms).   40 tablet   4   .  Multiple Vitamin (MULTIVITAMIN) tablet  Take 1 tablet by mouth daily.         Marland Kitchen  oxybutynin (DITROPAN) 5 MG tablet  Take 1 tablet (5 mg total) by mouth every 6 (six) hours as needed (bladder spasms).   40 tablet   4   .  oxyCODONE-acetaminophen (PERCOCET) 5-325 MG per tablet  Take 1-2 tablets by mouth every 4 (four) hours as needed for pain.   60 tablet   0   .  phenazopyridine (PYRIDIUM) 100 MG tablet  Take 1 tablet (100 mg total) by mouth every 8 (eight) hours as needed for pain (Burning urination.  Will turn urine and body fluids orange.).   30 tablet   1   .  senna-docusate (SENOKOT S) 8.6-50 MG per tablet  Take 1 tablet by mouth 2 (two) times daily.   60 tablet   0   .  simvastatin (ZOCOR) 20 MG tablet  2 by mouth at  bedtime, LABS DUE 06/2013   60 tablet   2       No current facility-administered medications for this visit.        No Known Allergies   Review of Systems  Constitutional: Positive for unexpected weight change. Negative for fever, chills, activity change, appetite change and fatigue.        Has lost about 10-15 lbs over the past few years.  HENT: Negative.   Eyes: Negative.   Respiratory: Negative.  Negative for cough, chest tightness and shortness of breath.   Cardiovascular: Negative.  Negative for chest pain.  Gastrointestinal: Negative.   Endocrine: Negative.   Genitourinary: Negative.   Musculoskeletal: Negative.   Allergic/Immunologic: Negative.   Neurological: Negative.   Hematological: Negative.   Psychiatric/Behavioral: Negative.       BP 142/91  Pulse 57  Temp(Src) 98.2 F (36.8 C) (Oral)  Resp 18  Ht 5\' 5"  (1.651 m)  Wt 139 lb 14.4 oz (63.458 kg)  BMI 23.28 kg/m2 Physical Exam  Constitutional: He is oriented to person, place, and time. He appears well-developed and well-nourished. No distress.  Smells like cigarettes.  HENT:   Head: Normocephalic and atraumatic.   Mouth/Throat: Oropharynx is clear and moist.  Eyes: Conjunctivae and EOM are normal. Pupils are equal, round, and reactive to light.  Neck: No JVD present. No thyromegaly present.  Cardiovascular: Normal rate, regular rhythm, normal heart sounds and intact distal pulses.    No murmur heard. Pulmonary/Chest: Effort normal and breath sounds normal. No respiratory distress. He has no rales.  Abdominal: Soft. Bowel sounds are  normal. He exhibits no distension and no mass. There is no tenderness.  Musculoskeletal: Normal range of motion. He exhibits no edema.  Lymphadenopathy:    He has no cervical adenopathy.  Neurological: He is alert and oriented to person, place, and time. He has normal strength. No cranial nerve deficit or sensory deficit.  Skin: Skin is dry.  Psychiatric: He has a normal  mood and affect.        Diagnostic Tests:    *RADIOLOGY REPORT*   Clinical Data:  AAA evaluation   CT ANGIOGRAPHY CHEST, ABDOMEN AND PELVIS   Technique:  Multidetector CT imaging through the chest, abdomen and pelvis was performed using the standard protocol during bolus administration of intravenous contrast.  Multiplanar reconstructed images including MIPs were obtained and reviewed to evaluate the vascular anatomy.   Contrast: 80mL OMNIPAQUE IOHEXOL 350 MG/ML SOLN,   Comparison:  Prior CT scan of the abdomen and pelvis 08/30/2012   CTA CHEST   Findings:   Mediastinum: Unremarkable CT appearance of the thyroid gland.  No suspicious mediastinal or hilar adenopathy.  Nonspecific, not enlarged by CT criteria mediastinal and hilar lymph nodes may be reactive.  The largest right paratracheal node measures 10 mm in short axis.  No soft tissue mediastinal mass.  The thoracic esophagus is unremarkable.   Heart/Vascular: There is a bovine configuration of the aortic arch (two vessel arch with common origin of the brachiocephalic and left common carotid arteries), a normal anatomic variant.  No aneurysmal dilatation, intramural hematoma or dissection.  The descending thoracic aorta is tortuous.  Scattered atherosclerotic vascular calcifications are noted without significant stenosis.  The heart is within normal limits for size.  There are atherosclerotic calcifications noted in the left anterior descending, left main, circumflex and right coronary arteries.  No pericardial effusion.   Lungs/ the Pleura: Trace centrilobular emphysematous changes noted in the apices.  Irregular mildly spiculated nodule in the periphery of the left upper lobe measures approximately 11 x 12 mm in greatest dimension (best measured on sagittal and coronal reformats. There is an adjacent small calcified subpleural lymph node or granuloma.  No additional suspicious pulmonary nodules identified.    Bones: Soft tissue anchors in the left humeral head suggest prior rotator cuff repair. No acute fracture or aggressive appearing lytic or blastic osseous lesion.  Prominent Schmorl's node versus minimal compression fracture of the T8 vertebral body.  The overall heart degenerative disc disease are relatively mild severity.    Review of the MIP images confirms the above findings.   IMPRESSION:   1.  Spiculated 11 x 12 mm peripheral left upper lobe pulmonary nodule is concerning for primary bronchogenic carcinoma.  Recommend further evaluation with PET CT.   2.  Trace biapical emphysematous changes.   3.  Atherosclerosis including the left main and three-vessel coronary artery disease.   These results were called by telephone on 04/14/2013 at 10:15 a.m. to Dr. Myra Gianotti, who verbally acknowledged these results.   CTA ABDOMEN AND PELVIS   Findings:   VASCULAR   Aorta: Fusiform aneurysmal dilatation of the infrarenal abdominal aorta with a maximal diameter of 4.8 x 4.6 cm.  There is crescentic smooth wall adherent thrombus.   Celiac: Widely patent.  Conventional hepatic arterial anatomy.   SMA: Widely patent.   Renals: One left and three-view right sided renal arteries.  The two lower pole accessory renal arteries arise from the distal aorta just proximal to the bifurcation and distal to the aneurysmal segment.  The  vessel supplied approximately 25% of the overall renal parenchyma.  Mild atherosclerotic calcification at the origins without significant stenosis.  No changes of fibromuscular dysplasia.   IMA: Widely patent.   Inflow: Ectatic common iliac arteries with scattered atherosclerotic calcifications but no focal stenosis.  The right common iliac artery measures up to 1.5 cm.  The left common iliac artery measures up to 1.7 cm.  There is a small penetrating ulcer arising off the anterior and inferior aspect of the left common iliac artery.  Bilateral hypogastric  arteries are patent. Fusiform ectasia of the left hypogastric artery shortly before the bifurcation measuring up to 1.3 cm.  The external iliac arteries demonstrate mild atherosclerotic vascular disease without significant stenosis.   Proximal Outflow: Calcified plaque along the posterior wall of the bilateral common femoral arteries without significant stenosis. The visualized superficial profunda femoral arteries are widely patent.   Veins: No focal venous abnormality detected.   NON-VASCULAR   Abdomen:  Unremarkable CT appearance of the stomach, duodenum, spleen, adrenal glands, pancreas and liver.  There is a solitary nonspecific sub centimeter hypoattenuating lesion in the mid right hepatic lobe which is too small to accurately characterize but highly likely a benign cyst.  Cholelithiasis without acute cholecystitis.   Multiple renal cortical cysts bilaterally.  No enhancing renal mass.  No hydronephrosis or nephrolithiasis.  Extrarenal pelvis on the right.   Normal-caliber large and small bowel throughout the abdomen.  No evidence of bowel obstruction or focal bowel wall thickening. Scattered colonic diverticula without evidence of active inflammation.  Normal appendix in the right lower quadrant.  No free fluid or suspicious adenopathy.   Pelvis: Small bilateral fat containing inguinal hernias. The prostate gland is enlarged at 5.8 cm in transverse diameter.  The bladder is distended and mildly thick-walled.  No free fluid or suspicious adenopathy.   Bones: No acute fracture or aggressive appearing lytic or blastic osseous lesion.  Mild multilevel degenerative disc disease.    Review of the MIP images confirms the above findings.   IMPRESSION:   1.  Fusiform infrarenal abdominal aortic aneurysm with maximal dimensions of 4.8 x 4.6 cm.   2.  Ectatic bilateral common iliac and left internal iliac arteries as detailed above.   3.  There are three right sided  renal arteries including two lower pole accessory arteries which arise off the distal aorta just proximal to the bifurcation.  These accessory arteries supply approximately 25% above the overall right renal parenchymal volume.   4.  Cholelithiasis.   5.  Diverticulosis.   6.  Bilateral fat containing inguinal hernias.   7.  Prostatomegaly with enlarged and trabeculated bladder suggestive of bladder outlet obstruction.   These results were called by telephone on 04/14/2013 at 10:15 a.m. to Dr. Myra Gianotti, who verbally acknowledged these results.     Original Report Authenticated By: Malachy Moan, M.D.              *RADIOLOGY REPORT*   Clinical Data: Initial treatment strategy for solitary pulmonary nodule.   NUCLEAR MEDICINE PET SKULL BASE TO THIGH   Fasting Blood Glucose:  99   Technique:  17.9 mCi F-18 FDG was injected intravenously. CT data was obtained and used for attenuation correction and anatomic localization only.  (This was not acquired as a diagnostic CT examination.) Additional exam technical data entered on technologist worksheet.   Comparison:  CTA chest abdomen pelvis dated 04/14/2013   Findings:   Neck: No hypermetabolic lymph nodes in the neck.  Chest:  Spiculated 11 x 7 mm nodule in the lateral left upper lobe (series 2/image 67), max SUV 2.4, suspicious for primary bronchogenic neoplasm.   8 mm short-axis AP window node (series 2/image 70), max SUV 5.0, suspicious for nodal metastasis.   Focal hypermetabolism in the left hilar region, max SUV 4.3 (PET image 74), worrisome for additional metastasis but without correlate on unenhanced CT.   Cardiomegaly.  Coronary atherosclerosis.   Abdomen/Pelvis:  No abnormal hypermetabolic activity within the liver, pancreas, adrenal glands, or spleen.   No hypermetabolic lymph nodes in the abdomen or pelvis.   4.9 cm infrarenal abdominal aortic aneurysm. Atherosclerotic calcifications of the  abdominal aorta and branch vessels.   Cholelithiasis, without associated inflammatory changes.  Left renal cysts.   Prostatomegaly.  Thick-walled bladder, possibly reflecting chronic bladder outlet obstruction.   Small fat-containing left inguinal hernia.  Small fat-containing right inguinal hernia with mild soft tissue stranding and associated hypermetabolism, max SUV 4.0 (PET image 197).   Skeleton:  No focal hypermetabolic activity to suggest skeletal metastasis.   IMPRESSION:   Spiculated 11 mm nodule in the lateral left upper lobe, max SUV 2.4, suspicious for primary bronchogenic neoplasm.   Suspected left hilar and AP window nodal metastases.   Mild hypermetabolism/stranding within a right inguinal hernia, of uncertain significance, but likely unrelated to malignancy.     Additional ancillary findings as above.     Original Report Authenticated By: Charline Bills, M.D.       Impression:   He has a suspicious hypermetabolic nodule in the left upper lobe with hypermetabolic lymphadenopathy in the left hilum and AP window. There are also enlarged paratracheal lymph nodes although they are not hypermetabolic. I suspect that he has a bronchogenic carcinoma with lymph node mets that would make it a stage III tumor if the AP window or mediastinal nodes are positive. I think bronchoscopy and mediastinoscopy is indicated to completely assess the mediastinal lymph nodes. If they are negative I would consider surgical resection depending on his PFT's. He may still have hilar lymph node mets but I don't see any discrete enlarged lymph nodes that I could biopsy with EBUS. I reviewed the CT and PET scans with him and his wife, discussed my impression and recommendation and he would like to think about it and will call me office later if he decides to proceed.     Plan:   He will call my office to schedule flexible video bronchoscopy and mediastinoscopy if he decides to  proceed with further workup. We will schedule PFT's with diffusion capacity at that time.

## 2013-05-01 NOTE — OR Nursing (Signed)
1st procedure (Bronchoscopy) end time was 0821.

## 2013-05-01 NOTE — Transfer of Care (Signed)
Immediate Anesthesia Transfer of Care Note  Patient: Jordan Mccullough  Procedure(s) Performed: Procedure(s): MEDIASTINOSCOPY (N/A) VIDEO BRONCHOSCOPY (N/A)  Patient Location: PACU  Anesthesia Type:General  Level of Consciousness: awake, alert  and oriented  Airway & Oxygen Therapy: Patient Spontanous Breathing and Patient connected to face mask oxygen  Post-op Assessment: Report given to PACU RN  Post vital signs: Reviewed and stable  Complications: No apparent anesthesia complications

## 2013-05-01 NOTE — Op Note (Signed)
CARDIOTHORACIC SURGERY OPERATIVE NOTE  05/01/2013 Janeth Rase [id number]  Surgeon:  Alleen Borne, MD  First Assistant: none   Preoperative Diagnosis:  Left upper lobe lung nodule with hypermetabolic lymphadenopathy in the left hilum and A/P window on PET scan   Postoperative Diagnosis: Same  Procedure:  1. Flexible video bronchoscopy 2. 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 There were enlarged mediastinal and hilar lymph nodes with the largest being a right paratracheal node measuring 10 mm. A PET scan showed the nodule to have an SUV of 2.4 suspicious for bronchogenic carcinoma. There is a 8 mm AP window lymph node with a SUV of 5.0. There is focal hypermetabolism in the left hilum with an SUV of 4.3 that is concerning for lymph node mets. The patient reports that he is asymptomatic. I suspect that he has a bronchogenic carcinoma with lymph node mets that would make it a stage III tumor if the AP window or mediastinal nodes are positive. I think bronchoscopy and mediastinoscopy is indicated to completely assess the mediastinal lymph nodes. If they are negative I would consider surgical resection depending on his PFT's. He may still have hilar lymph node mets but I don't see any discrete enlarged lymph nodes that I could biopsy with EBUS. I reviewed the CT and PET scans with him and his wife, discussed my impression and recommendation and he would like to proceed. I discussed the benefits, alternatives, and risks including but not limited to bleeding, infection, injury to the airway or mediastinal structures, pneumothorax, and false negative biopsy results. He and his  wife understand and agree to proceed.   Preparation:  The patient was seen in the preoperative holding area and the correct patient, correct operation were confirmed with the patient after reviewing the medical record and CT scan. The consent was signed by me. Preoperative antibiotics were given.  The patient was taken back to the operating room and positioned supine on the operating room table. After being placed under general endotracheal anesthesia by the anesthesia team a surgical time-out was taken and the correct patient and operative procedure were confirmed with the nursing and anesthesia staff.   Video Bronchoscopy:  The bronchoscope was advanced down the endotracheal tube. The distal trachea was normal. The carina was sharp. The right bronchial tree had normal segmental anatomy with some clear mucous present throughout but no endobronchial lesions and no external compression. The left bronchial tree was examined and also had normal segmental anatomy. I was able to visualize out into the subsegmental bronchi and there were no lesions present. There was no external compression. The bronchoscope was removed.   Mediastinoscopy:  A roll was placed beneath the shoulders and the neck slightly extended. The neck and chest were prepped with betadine soap and solution and draped in the usual sterile manner. A 2nd time out was taken and the correct patient and procedure were confirmed with nursing and anesthesia. A 2 cm incision was made horizontally just above the sternal notch in the natural skin crease. Electrocautery was used to continue down through the subcutaneous tissue. The strap muscles were separated in the midline to expose the  trachea. A pretracheal plane was developed bluntly and the mediastinoscope inserted. It was advanced along the anterior tracheal wall. There were multiple lymph nodes seen and biopsies were taken at 4 R, 10 R, 7, 4 L, and 10 L. These were sent for permanent  pathology. Hemostasis was complete at the end of the procedure. The scope was removed and the strap muscles re-approximated with interrupted 3-0 vicryl sutures. The platysma muscle was re-approximated with 3-0 vicryl suture and the skin with 4-0 vicryl subcuticular suture. Dermabond was applied. The sponge, needle, and instrument counts were correct according to the nurses. The patient was awakened, extubated and transported to the PACU in stable condition.

## 2013-05-02 ENCOUNTER — Encounter (HOSPITAL_COMMUNITY): Payer: Self-pay | Admitting: Surgery

## 2013-05-07 ENCOUNTER — Other Ambulatory Visit: Payer: Self-pay | Admitting: *Deleted

## 2013-05-07 ENCOUNTER — Ambulatory Visit (INDEPENDENT_AMBULATORY_CARE_PROVIDER_SITE_OTHER): Payer: Self-pay | Admitting: Surgery

## 2013-05-07 ENCOUNTER — Encounter: Payer: Self-pay | Admitting: Surgery

## 2013-05-07 VITALS — BP 157/94 | HR 81 | Resp 16 | Ht 65.0 in | Wt 139.0 lb

## 2013-05-07 DIAGNOSIS — R519 Headache, unspecified: Secondary | ICD-10-CM

## 2013-05-07 DIAGNOSIS — R911 Solitary pulmonary nodule: Secondary | ICD-10-CM

## 2013-05-07 DIAGNOSIS — R590 Localized enlarged lymph nodes: Secondary | ICD-10-CM

## 2013-05-07 DIAGNOSIS — R599 Enlarged lymph nodes, unspecified: Secondary | ICD-10-CM

## 2013-05-07 NOTE — Progress Notes (Signed)
       [address]       [place] [address]             [phone]        HPI:  The patient returns today to discuss the results of his recent mediastinoscopy. All of the lymph node biopsies were negative for malignancy. He has had no complaints since the procedure.  Current Outpatient Prescriptions  Medication Sig Dispense Refill  . aspirin 81 MG tablet Take 81 mg by mouth every evening.       . Multiple Vitamin (MULTIVITAMIN) tablet Take 1 tablet by mouth every evening.       . simvastatin (ZOCOR) 40 MG tablet Take 40 mg by mouth every evening.       No current facility-administered medications for this visit.     Physical Exam: BP 157/94  Pulse 81  Resp 16  Ht 5\' 5"  (1.651 m)  Wt 139 lb (63.05 kg)  BMI 23.13 kg/m2  SpO2 97% The neck incision is healing well Lungs are clear.  Diagnostic Tests:  Pathology reviewed in EPIC  Impression/Plan:  He has a 12 mm irregular spiculated nodule in the periphery of the left upper lobe of the lung with hypermetabolic activity in the left hilar region and aorto-pulmonary window. The mediastinal lymph nodes are negative. It is certainly possible that these hypermetabolic lymph nodes are reactive in this heavy smoker with a small peripheral lung nodule. I think we should proceed with surgical resection and removal of the hilar and aorto-pulmonary lymph nodes. It is certainly possible that he does have stage II or IIIA disease and may need postop chemotherapy and he understands that. His pulmonary function tests are better than I would have expected with his heavy smoking history and certainly good enough to tolerate a left upper lobectomy. We will schedule an MRI to evaluate his brain prior to surgery. I reviewed all of this with the patient and his family including alternatives, benefits, and risks including but not limited to bleeding, infection, bronchial stump complications, prolonged air leak, pleural space  complications, respiratory failure and death. He would like to proceed and will call our office to schedule surgery.

## 2013-05-12 ENCOUNTER — Other Ambulatory Visit: Payer: Self-pay | Admitting: *Deleted

## 2013-05-12 DIAGNOSIS — R911 Solitary pulmonary nodule: Secondary | ICD-10-CM

## 2013-05-16 ENCOUNTER — Ambulatory Visit
Admission: RE | Admit: 2013-05-16 | Discharge: 2013-05-16 | Disposition: A | Discharge status: Home / Self Care | Payer: Medicare Other | Source: Ambulatory Visit | Attending: Surgery | Admitting: Surgery

## 2013-05-16 DIAGNOSIS — R519 Headache, unspecified: Secondary | ICD-10-CM

## 2013-05-16 DIAGNOSIS — R911 Solitary pulmonary nodule: Secondary | ICD-10-CM

## 2013-05-16 MED ORDER — GADOBENATE DIMEGLUMINE 529 MG/ML IV SOLN
12.0000 mL | Freq: Once | INTRAVENOUS | Status: AC | PRN
  Administered 2013-05-16: 12 mL via INTRAVENOUS

## 2013-05-21 ENCOUNTER — Ambulatory Visit (INDEPENDENT_AMBULATORY_CARE_PROVIDER_SITE_OTHER): Payer: Medicare Other | Admitting: Surgery

## 2013-05-21 ENCOUNTER — Encounter (HOSPITAL_COMMUNITY): Payer: Self-pay | Admitting: Pharmacy Technician

## 2013-05-21 ENCOUNTER — Encounter: Payer: Self-pay | Admitting: Surgery

## 2013-05-21 VITALS — BP 131/85 | HR 87 | Resp 16 | Ht 65.0 in | Wt 139.0 lb

## 2013-05-21 DIAGNOSIS — R590 Localized enlarged lymph nodes: Secondary | ICD-10-CM

## 2013-05-21 DIAGNOSIS — D381 Neoplasm of uncertain behavior of trachea, bronchus and lung: Secondary | ICD-10-CM

## 2013-05-21 DIAGNOSIS — R599 Enlarged lymph nodes, unspecified: Secondary | ICD-10-CM

## 2013-05-21 DIAGNOSIS — C3490 Malignant neoplasm of unspecified part of unspecified bronchus or lung: Secondary | ICD-10-CM

## 2013-05-21 HISTORY — DX: Malignant neoplasm of unspecified part of unspecified bronchus or lung: C34.90

## 2013-05-22 ENCOUNTER — Encounter: Payer: Self-pay | Admitting: Surgery

## 2013-05-22 NOTE — Progress Notes (Signed)
      [address]       [place] [address]             [phone]        HPI:  The patient returns today to discuss the results of his brain MRI and plans for surgery scheduled next week. He continues to smoke 1 ppd. He denies any symptoms.  Current Outpatient Prescriptions  Medication Sig Dispense Refill  . aspirin EC 81 MG tablet Take 81 mg by mouth daily.      . Multiple Vitamin (MULTIVITAMIN) tablet Take 1 tablet by mouth every evening.       . simvastatin (ZOCOR) 40 MG tablet Take 40 mg by mouth every evening.       No current facility-administered medications for this visit.     Physical Exam: BP 131/85  Pulse 87  Resp 16  Ht 5\' 5"  (1.651 m)  Wt 139 lb (63.05 kg)  BMI 23.13 kg/m2  SpO2 97% He looks well There is no cervical or supraclavicular adenopathy Lungs are clear Heart shows a regular rate and rhythm.  Diagnostic Tests:  *RADIOLOGY REPORT*  Clinical Data: Lung cancer, staging.  MRI HEAD WITHOUT AND WITH CONTRAST  Technique: Multiplanar, multiecho pulse sequences of the brain and  surrounding structures were obtained according to standard protocol  without and with intravenous contrast  Contrast: 12mL MULTIHANCE GADOBENATE DIMEGLUMINE 529 MG/ML IV SOLN  Comparison: PET scan 04/17/2013.  Findings: There is no evidence for acute infarction, intracranial  hemorrhage, mass lesion, hydrocephalus, or extra-axial fluid. Mild  atrophy. Mild chronic microvascular ischemic change. Flow voids  are maintained. No midline abnormality. No osseous lesions.  Negative orbits, sinuses, and mastoids.  Post infusion, no abnormal enhancement of the brain or meninges.  No intracranial metastatic deposits are evident. Major venous  sinuses are patent. Extracranial soft tissues unremarkable.  IMPRESSION:  Chronic changes as described. No intracranial metastatic lesions  are evident.  Original Report Authenticated By: Davonna Belling, M.D.     Impression:  He has a 12 mm irregular spiculated nodule in the periphery of the left upper lobe of the lung with hypermetabolic activity in the left hilar region and aorto-pulmonary window. The mediastinal lymph nodes are negative. It is certainly possible that these hypermetabolic lymph nodes are reactive in this heavy smoker with a small peripheral lung nodule. I think we should proceed with surgical resection and removal of the hilar and aorto-pulmonary lymph nodes. It is certainly possible that he does have stage II or IIIA disease and may need postop chemotherapy and he understands that. His pulmonary function tests are better than I would have expected with his heavy smoking history and certainly good enough to tolerate a left upper lobectomy. I reviewed all of this with the patient and his family including alternatives, benefits, and risks including but not limited to bleeding, infection, bronchial stump complications, prolonged air leak, pleural space complications, respiratory failure and death.   Plan:  Left thoracotomy for wedge resection and possible left upper lobectomy and lymph node resection next Thursday.

## 2013-05-26 NOTE — Pre-Procedure Instructions (Signed)
Jordan Mccullough  05/26/2013   Your procedure is scheduled on:  05/29/13  Report to [facility]  2 * 3 at 530 AM.  Call this number if you have problems the morning of surgery: [phone]   Remember:   Do not eat food or drink liquids after midnight.   Take these medicines the morning of surgery with A SIP OF WATER: none   Do not wear jewelry, make-up or nail polish.  Do not wear lotions, powders, or perfumes. You may wear deodorant.  Do not shave 48 hours prior to surgery. Men may shave face and neck.  Do not bring valuables to the hospital.  [facility] is not responsible                  for any belongings or valuables.               Contacts, dentures or bridgework may not be worn into surgery.  Leave suitcase in the car. After surgery it may be brought to your room.  For patients admitted to the hospital, discharge time is determined by your                treatment team.               Patients discharged the day of surgery will not be allowed to drive  home.  Name and phone number of your driver: family  Special Instructions: Shower using CHG 2 nights before surgery and the night before surgery.  If you shower the day of surgery use CHG.  Use special wash - you have one bottle of CHG for all showers.  You should use approximately 1/3 of the bottle for each shower.   Please read over the following fact sheets that you were given: Pain Booklet, Coughing and Deep Breathing, Blood Transfusion Information, MRSA Information and Surgical Site Infection Prevention

## 2013-05-27 ENCOUNTER — Encounter (HOSPITAL_COMMUNITY): Payer: Self-pay

## 2013-05-27 ENCOUNTER — Encounter (HOSPITAL_COMMUNITY)
Admission: RE | Admit: 2013-05-27 | Discharge: 2013-05-27 | Disposition: A | Discharge status: Home / Self Care | Payer: Medicare Other | Source: Ambulatory Visit | Attending: Surgery | Admitting: Surgery

## 2013-05-27 ENCOUNTER — Ambulatory Visit (HOSPITAL_COMMUNITY)
Admission: RE | Admit: 2013-05-27 | Discharge: 2013-05-27 | Disposition: A | Discharge status: Home / Self Care | Payer: Medicare Other | Source: Ambulatory Visit | Attending: Surgery | Admitting: Surgery

## 2013-05-27 VITALS — BP 155/91 | HR 87 | Temp 98.3°F | Resp 18 | Ht 65.0 in | Wt 144.2 lb

## 2013-05-27 DIAGNOSIS — I739 Peripheral vascular disease, unspecified: Secondary | ICD-10-CM | POA: Diagnosis present

## 2013-05-27 DIAGNOSIS — Z23 Encounter for immunization: Secondary | ICD-10-CM

## 2013-05-27 DIAGNOSIS — I498 Other specified cardiac arrhythmias: Secondary | ICD-10-CM | POA: Diagnosis not present

## 2013-05-27 DIAGNOSIS — C771 Secondary and unspecified malignant neoplasm of intrathoracic lymph nodes: Secondary | ICD-10-CM | POA: Diagnosis present

## 2013-05-27 DIAGNOSIS — Z01818 Encounter for other preprocedural examination: Secondary | ICD-10-CM | POA: Insufficient documentation

## 2013-05-27 DIAGNOSIS — I714 Abdominal aortic aneurysm, without rupture, unspecified: Secondary | ICD-10-CM | POA: Diagnosis present

## 2013-05-27 DIAGNOSIS — K5900 Constipation, unspecified: Secondary | ICD-10-CM | POA: Diagnosis not present

## 2013-05-27 DIAGNOSIS — I2510 Atherosclerotic heart disease of native coronary artery without angina pectoris: Secondary | ICD-10-CM | POA: Diagnosis present

## 2013-05-27 DIAGNOSIS — J9383 Other pneumothorax: Secondary | ICD-10-CM | POA: Diagnosis not present

## 2013-05-27 DIAGNOSIS — Z87891 Personal history of nicotine dependence: Secondary | ICD-10-CM

## 2013-05-27 DIAGNOSIS — R911 Solitary pulmonary nodule: Secondary | ICD-10-CM

## 2013-05-27 DIAGNOSIS — I252 Old myocardial infarction: Secondary | ICD-10-CM

## 2013-05-27 DIAGNOSIS — C3490 Malignant neoplasm of unspecified part of unspecified bronchus or lung: Secondary | ICD-10-CM | POA: Insufficient documentation

## 2013-05-27 DIAGNOSIS — Z7982 Long term (current) use of aspirin: Secondary | ICD-10-CM

## 2013-05-27 DIAGNOSIS — E785 Hyperlipidemia, unspecified: Secondary | ICD-10-CM | POA: Diagnosis present

## 2013-05-27 DIAGNOSIS — J9382 Other air leak: Secondary | ICD-10-CM | POA: Diagnosis not present

## 2013-05-27 DIAGNOSIS — Z01812 Encounter for preprocedural laboratory examination: Secondary | ICD-10-CM | POA: Insufficient documentation

## 2013-05-27 DIAGNOSIS — Z79899 Other long term (current) drug therapy: Secondary | ICD-10-CM

## 2013-05-27 DIAGNOSIS — J9819 Other pulmonary collapse: Secondary | ICD-10-CM | POA: Diagnosis not present

## 2013-05-27 DIAGNOSIS — I723 Aneurysm of iliac artery: Secondary | ICD-10-CM | POA: Diagnosis present

## 2013-05-27 DIAGNOSIS — Z9861 Coronary angioplasty status: Secondary | ICD-10-CM

## 2013-05-27 LAB — COMPREHENSIVE METABOLIC PANEL
ALT: 30 U/L (ref 0–53)
AST: 28 U/L (ref 0–37)
Albumin: 3.9 g/dL (ref 3.5–5.2)
Alkaline Phosphatase: 49 U/L (ref 39–117)
BUN: 13 mg/dL (ref 6–23)
CO2: 21 mEq/L (ref 19–32)
Calcium: 9.4 mg/dL (ref 8.4–10.5)
Chloride: 101 mEq/L (ref 96–112)
Creatinine, Ser: 0.62 mg/dL (ref 0.50–1.35)
GFR calc Af Amer: >90 mL/min (ref >90)
GFR calc non Af Amer: >90 mL/min (ref >90)
Glucose, Bld: 102 mg/dL — ABNORMAL HIGH (ref 70–99)
Potassium: 4.1 mEq/L (ref 3.5–5.1)
Sodium: 135 mEq/L (ref 135–145)
Total Bilirubin: 0.4 mg/dL (ref 0.3–1.2)
Total Protein: 7.4 g/dL (ref 6.0–8.3)

## 2013-05-27 LAB — SURGICAL PCR SCREEN
MRSA, PCR: NEGATIVE
Staphylococcus aureus: NEGATIVE

## 2013-05-27 LAB — BLOOD GAS, ARTERIAL
Acid-base deficit: 0.3 mmol/L (ref 0.0–2.0)
Bicarbonate: 23.4 mEq/L (ref 20.0–24.0)
Drawn by: 206361
FIO2: 0.21 %
O2 Saturation: 98.4 %
Patient temperature: 98.6
TCO2: 24.5 mmol/L (ref 0–100)
pCO2 arterial: 35.6 mmHg (ref 35.0–45.0)
pH, Arterial: 7.434 (ref 7.350–7.450)
pO2, Arterial: 90.2 mmHg (ref 80.0–100.0)

## 2013-05-27 LAB — CBC
HCT: 40.5 % (ref 39.0–52.0)
Hemoglobin: 14.4 g/dL (ref 13.0–17.0)
MCH: 31.6 pg (ref 26.0–34.0)
MCHC: 35.6 g/dL (ref 30.0–36.0)
MCV: 88.8 fL (ref 78.0–100.0)
Platelets: 202 10*3/uL (ref 150–400)
RBC: 4.56 MIL/uL (ref 4.22–5.81)
RDW: 14.2 % (ref 11.5–15.5)
WBC: 5.1 10*3/uL (ref 4.0–10.5)

## 2013-05-27 LAB — PROTIME-INR
INR: 0.95 (ref 0.00–1.49)
Prothrombin Time: 12.5 seconds (ref 11.6–15.2)

## 2013-05-27 LAB — TYPE AND SCREEN
ABO/RH(D): A POS
Antibody Screen: NEGATIVE

## 2013-05-27 LAB — URINALYSIS, ROUTINE W REFLEX MICROSCOPIC
Bilirubin Urine: NEGATIVE
Glucose, UA: NEGATIVE mg/dL
Hgb urine dipstick: NEGATIVE
Ketones, ur: NEGATIVE mg/dL
Leukocytes, UA: NEGATIVE
Nitrite: NEGATIVE
Protein, ur: NEGATIVE mg/dL
Specific Gravity, Urine: 1.006 (ref 1.005–1.030)
Urobilinogen, UA: 0.2 mg/dL (ref 0.0–1.0)
pH: 7 (ref 5.0–8.0)

## 2013-05-27 LAB — APTT: aPTT: 24 seconds (ref 24–37)

## 2013-05-27 NOTE — Progress Notes (Signed)
Anesthesia Chart Review: Patient is a 71 year old male scheduled for left thoracotomy for LU lobectomy on 05/29/13 by Dr. Laneta Simmers.  He is s/p mediastinoscopy, video bronchoscopy on 05/01/13 that showed negative lymph nodes for malignancy.  Dr. Laneta Simmers has now recommended a left upper lobectomy for further treatment/evaluation of a LUL spiculated lung lesion with hypermetabolic activity.    Patient has a known AAA and underwent CTA of the chest and abdomen on 04/14/13 that showed a stable 4.8 cm infrarenal AAA but a spiculated LUL nodule. Other history includes smoking, CAD/MI '92 s/p PTCA, hearing loss, diverticulosis, HLD, bilateral inguinal hernia repair, prostate and bladder biopsies 10/09/12. PCP is Dr. Marga Melnick. Cardiologist is Dr. Verne Carrow, last visit 07/10/12.   Echo on 07/26/12 showed: Normal LV size with mild systolic dysfunction, EF 45%. There was basal to mid posterior and inferior moderate to severe hypokinesis. Grade 1 diastolic dysfunction. Normal RV size and systolic function. No significant valvular abnormalities (trivial MR). Mildly dilated aortic root 39 mm.   Stress myoview 08/04/11 with no ischemia, diffuse hypokinesis, LVEF=45%. Exercise stress test 06/25/12 and he exercised for 9 minutes of the Bruce protocol and he did well with no chest pain, SOB, fatigue. No EKG changes to suggest ischemia. He did have PVCs during exercise and in recovery.   EKG on 04/29/13 showed SR with occasional PVC's, LAFB.   Carotid duplex on 04/14/13 showed no significant bilateral ICA stenosis.   CXR on 04/29/13 showed: Subtle spiculated density in the lateral left upper lobe correlating to the PET CT finding presumably the patient's cancer. No acute abnormality identified.   PFTs on 04/29/13 showed FVC 3.22 (90%), FEV1 2.38 (91%), DLCOunc 80%.  Preoperative labs noted.   Patient has had cardiology follow-up and exercise and nuclear stress testing within the past two years. He has tolerated  two surgical procedures earlier this year.  If no acute changes then I would anticipate that he could proceed as planned with this procedure.   Velna Ochs [facility]/Anesthesiology Phone [phone] 05/27/2013 3:20 PM

## 2013-05-28 MED ORDER — DEXTROSE 5 % IV SOLN
1.5000 g | INTRAVENOUS | Status: AC
  Administered 2013-05-29 (×2): 1.5 g via INTRAVENOUS
  Filled 2013-05-28: qty 1.5

## 2013-05-29 ENCOUNTER — Inpatient Hospital Stay (HOSPITAL_COMMUNITY): Payer: Medicare Other

## 2013-05-29 ENCOUNTER — Inpatient Hospital Stay (HOSPITAL_COMMUNITY): Payer: Medicare Other | Admitting: Certified Registered"

## 2013-05-29 ENCOUNTER — Encounter (HOSPITAL_COMMUNITY)
Admission: RE | Disposition: A | Discharge status: Home Health | Payer: Self-pay | Source: Ambulatory Visit | Attending: Surgery

## 2013-05-29 ENCOUNTER — Encounter (HOSPITAL_COMMUNITY): Payer: Medicare Other | Admitting: Vascular Surgery

## 2013-05-29 ENCOUNTER — Inpatient Hospital Stay (HOSPITAL_COMMUNITY)
Admission: RE | Admit: 2013-05-29 | Discharge: 2013-06-06 | DRG: 164 | Disposition: A | Discharge status: Home Health | Payer: Medicare Other | Source: Ambulatory Visit | Attending: Surgery | Admitting: Surgery

## 2013-05-29 ENCOUNTER — Encounter (HOSPITAL_COMMUNITY): Payer: Self-pay | Admitting: Certified Registered"

## 2013-05-29 DIAGNOSIS — R911 Solitary pulmonary nodule: Secondary | ICD-10-CM | POA: Diagnosis present

## 2013-05-29 DIAGNOSIS — C3410 Malignant neoplasm of upper lobe, unspecified bronchus or lung: Secondary | ICD-10-CM

## 2013-05-29 HISTORY — PX: THOROCOTOMY WITH LOBECTOMY: SHX6118

## 2013-05-29 SURGERY — THORACOTOMY, WITH LOBECTOMY
Anesthesia: General | Site: Chest | Laterality: Left | Wound class: Clean

## 2013-05-29 MED ORDER — ONDANSETRON HCL 4 MG/2ML IJ SOLN: 4.0000 mg | Freq: Four times a day (QID) | INTRAMUSCULAR | Status: DC | PRN

## 2013-05-29 MED ORDER — LUNG SURGERY BOOK
Freq: Once | Status: AC
  Administered 2013-05-29: 20:00:00
  Filled 2013-05-29: qty 1

## 2013-05-29 MED ORDER — TRAMADOL HCL 50 MG PO TABS
50.0000 mg | ORAL_TABLET | Freq: Four times a day (QID) | ORAL | Status: DC | PRN
  Administered 2013-05-30 – 2013-06-05 (×14): 50 mg via ORAL
  Filled 2013-05-29: qty 2
  Filled 2013-05-29 (×13): qty 1

## 2013-05-29 MED ORDER — DEXTROSE-NACL 5-0.45 % IV SOLN
INTRAVENOUS | Status: DC
  Administered 2013-05-29: 15:00:00 via INTRAVENOUS
  Administered 2013-05-31: 20 mL/h via INTRAVENOUS

## 2013-05-29 MED ORDER — NALOXONE HCL 0.4 MG/ML IJ SOLN: 0.4000 mg | INTRAMUSCULAR | Status: DC | PRN

## 2013-05-29 MED ORDER — PROPOFOL 10 MG/ML IV BOLUS
INTRAVENOUS | Status: DC | PRN
  Administered 2013-05-29: 120 mg via INTRAVENOUS

## 2013-05-29 MED ORDER — VECURONIUM BROMIDE 10 MG IV SOLR
INTRAVENOUS | Status: DC | PRN
  Administered 2013-05-29 (×3): 1 mg via INTRAVENOUS
  Administered 2013-05-29: 2 mg via INTRAVENOUS
  Administered 2013-05-29 (×3): 1 mg via INTRAVENOUS

## 2013-05-29 MED ORDER — GLYCOPYRROLATE 0.2 MG/ML IJ SOLN
INTRAMUSCULAR | Status: DC | PRN
  Administered 2013-05-29: 0.4 mg via INTRAVENOUS

## 2013-05-29 MED ORDER — PHENYLEPHRINE HCL 10 MG/ML IJ SOLN
INTRAMUSCULAR | Status: DC | PRN
  Administered 2013-05-29 (×2): 80 ug via INTRAVENOUS

## 2013-05-29 MED ORDER — SENNOSIDES-DOCUSATE SODIUM 8.6-50 MG PO TABS
1.0000 | ORAL_TABLET | Freq: Every evening | ORAL | Status: DC | PRN
  Administered 2013-06-01: 1 via ORAL
  Filled 2013-05-29 (×2): qty 1

## 2013-05-29 MED ORDER — DIPHENHYDRAMINE HCL 12.5 MG/5ML PO ELIX
12.5000 mg | ORAL_SOLUTION | Freq: Four times a day (QID) | ORAL | Status: DC | PRN
  Filled 2013-05-29: qty 5

## 2013-05-29 MED ORDER — ACETAMINOPHEN 500 MG PO TABS
1000.0000 mg | ORAL_TABLET | Freq: Four times a day (QID) | ORAL | Status: AC
  Administered 2013-05-29 – 2013-05-30 (×3): 1000 mg via ORAL
  Filled 2013-05-29 (×4): qty 2

## 2013-05-29 MED ORDER — NEOSTIGMINE METHYLSULFATE 1 MG/ML IJ SOLN
INTRAMUSCULAR | Status: DC | PRN
  Administered 2013-05-29: 3 mg via INTRAVENOUS

## 2013-05-29 MED ORDER — HYDROMORPHONE HCL PF 1 MG/ML IJ SOLN
0.2500 mg | INTRAMUSCULAR | Status: DC | PRN
  Administered 2013-05-29 (×2): 0.5 mg via INTRAVENOUS

## 2013-05-29 MED ORDER — ONDANSETRON HCL 4 MG/2ML IJ SOLN
4.0000 mg | Freq: Four times a day (QID) | INTRAMUSCULAR | Status: DC | PRN
  Administered 2013-05-30: 4 mg via INTRAVENOUS
  Filled 2013-05-29: qty 2

## 2013-05-29 MED ORDER — MIDAZOLAM HCL 5 MG/5ML IJ SOLN
INTRAMUSCULAR | Status: DC | PRN
  Administered 2013-05-29: 2 mg via INTRAVENOUS

## 2013-05-29 MED ORDER — LIDOCAINE HCL (CARDIAC) 20 MG/ML IV SOLN
INTRAVENOUS | Status: DC | PRN
  Administered 2013-05-29: 100 mg via INTRAVENOUS

## 2013-05-29 MED ORDER — OXYCODONE-ACETAMINOPHEN 5-325 MG PO TABS
1.0000 | ORAL_TABLET | ORAL | Status: DC | PRN
  Administered 2013-06-01: 1 via ORAL

## 2013-05-29 MED ORDER — ONDANSETRON HCL 4 MG/2ML IJ SOLN
INTRAMUSCULAR | Status: DC | PRN
  Administered 2013-05-29: 4 mg via INTRAMUSCULAR

## 2013-05-29 MED ORDER — DEXTROSE 5 % IV SOLN
1.5000 g | Freq: Once | INTRAVENOUS | Status: DC
  Filled 2013-05-29: qty 1.5

## 2013-05-29 MED ORDER — ARTIFICIAL TEARS OP OINT
TOPICAL_OINTMENT | OPHTHALMIC | Status: DC | PRN
  Administered 2013-05-29: 1 via OPHTHALMIC

## 2013-05-29 MED ORDER — LACTATED RINGERS IV SOLN
INTRAVENOUS | Status: DC | PRN
  Administered 2013-05-29 (×2): via INTRAVENOUS

## 2013-05-29 MED ORDER — OXYCODONE HCL 5 MG PO TABS
5.0000 mg | ORAL_TABLET | ORAL | Status: AC | PRN
Start: 2013-05-29 — End: 2013-05-30

## 2013-05-29 MED ORDER — BUPIVACAINE 0.5 % ON-Q PUMP SINGLE CATH 400 ML
400.0000 mL | INJECTION | Status: DC
  Filled 2013-05-29: qty 400

## 2013-05-29 MED ORDER — HYDROMORPHONE HCL PF 1 MG/ML IJ SOLN
INTRAMUSCULAR | Status: AC
  Filled 2013-05-29: qty 1

## 2013-05-29 MED ORDER — OXYCODONE HCL 5 MG/5ML PO SOLN: 5.0000 mg | Freq: Once | ORAL | Status: DC | PRN

## 2013-05-29 MED ORDER — BUPIVACAINE 0.5 % ON-Q PUMP SINGLE CATH 400 ML: INJECTION | Status: DC | PRN

## 2013-05-29 MED ORDER — OXYCODONE HCL 5 MG PO TABS: 5.0000 mg | ORAL_TABLET | Freq: Once | ORAL | Status: DC | PRN

## 2013-05-29 MED ORDER — HYDROMORPHONE 0.3 MG/ML IV SOLN
INTRAVENOUS | Status: AC
  Administered 2013-05-29: 15:00:00
  Filled 2013-05-29: qty 25

## 2013-05-29 MED ORDER — PHENYLEPHRINE HCL 10 MG/ML IJ SOLN
10.0000 mg | INTRAVENOUS | Status: DC | PRN
  Administered 2013-05-29: 10 ug/min via INTRAVENOUS

## 2013-05-29 MED ORDER — MEPERIDINE HCL 25 MG/ML IJ SOLN: 6.2500 mg | INTRAMUSCULAR | Status: DC | PRN

## 2013-05-29 MED ORDER — ACETAMINOPHEN 160 MG/5ML PO SOLN: 1000.0000 mg | Freq: Four times a day (QID) | ORAL | Status: AC

## 2013-05-29 MED ORDER — ONDANSETRON HCL 4 MG/2ML IJ SOLN: 4.0000 mg | Freq: Once | INTRAMUSCULAR | Status: DC | PRN

## 2013-05-29 MED ORDER — FENTANYL CITRATE 0.05 MG/ML IJ SOLN
INTRAMUSCULAR | Status: DC | PRN
  Administered 2013-05-29: 25 ug via INTRAVENOUS
  Administered 2013-05-29: 150 ug via INTRAVENOUS
  Administered 2013-05-29: 50 ug via INTRAVENOUS
  Administered 2013-05-29: 100 ug via INTRAVENOUS
  Administered 2013-05-29: 25 ug via INTRAVENOUS

## 2013-05-29 MED ORDER — POTASSIUM CHLORIDE 10 MEQ/50ML IV SOLN
10.0000 meq | Freq: Every day | INTRAVENOUS | Status: DC | PRN
  Filled 2013-05-29: qty 50

## 2013-05-29 MED ORDER — OXYCODONE-ACETAMINOPHEN 5-325 MG PO TABS
1.0000 | ORAL_TABLET | ORAL | Status: DC | PRN
  Filled 2013-05-29: qty 1
  Filled 2013-05-29: qty 2
  Filled 2013-05-29: qty 1

## 2013-05-29 MED ORDER — KETOROLAC TROMETHAMINE 15 MG/ML IJ SOLN
15.0000 mg | Freq: Four times a day (QID) | INTRAMUSCULAR | Status: AC | PRN
  Administered 2013-06-02: 15 mg via INTRAVENOUS
  Filled 2013-05-29: qty 1

## 2013-05-29 MED ORDER — HYDROMORPHONE 0.3 MG/ML IV SOLN
INTRAVENOUS | Status: DC
  Administered 2013-05-29: 1.39 mg via INTRAVENOUS
  Administered 2013-05-29: 1.19 mg via INTRAVENOUS
  Administered 2013-05-29: 15:00:00 via INTRAVENOUS
  Administered 2013-05-30: 50 mg via INTRAVENOUS
  Administered 2013-05-30: 02:00:00 via INTRAVENOUS
  Administered 2013-05-30: 1.37 mg via INTRAVENOUS
  Filled 2013-05-29: qty 25

## 2013-05-29 MED ORDER — INFLUENZA VAC SPLIT QUAD 0.5 ML IM SUSP
0.5000 mL | INTRAMUSCULAR | Status: AC
  Filled 2013-05-29: qty 0.5

## 2013-05-29 MED ORDER — NICOTINE 21 MG/24HR TD PT24
21.0000 mg | MEDICATED_PATCH | Freq: Every day | TRANSDERMAL | Status: DC
  Administered 2013-05-29 – 2013-06-06 (×9): 21 mg via TRANSDERMAL
  Filled 2013-05-29 (×10): qty 1

## 2013-05-29 MED ORDER — PNEUMOCOCCAL VAC POLYVALENT 25 MCG/0.5ML IJ INJ
0.5000 mL | INJECTION | INTRAMUSCULAR | Status: AC
  Filled 2013-05-29: qty 0.5

## 2013-05-29 MED ORDER — CEFUROXIME SODIUM 750 MG IJ SOLR
INTRAMUSCULAR | Status: AC
  Filled 2013-05-29: qty 1500

## 2013-05-29 MED ORDER — DIPHENHYDRAMINE HCL 50 MG/ML IJ SOLN: 12.5000 mg | Freq: Four times a day (QID) | INTRAMUSCULAR | Status: DC | PRN

## 2013-05-29 MED ORDER — ROCURONIUM BROMIDE 100 MG/10ML IV SOLN
INTRAVENOUS | Status: DC | PRN
  Administered 2013-05-29: 20 mg via INTRAVENOUS
  Administered 2013-05-29: 10 mg via INTRAVENOUS
  Administered 2013-05-29: 50 mg via INTRAVENOUS
  Administered 2013-05-29: 20 mg via INTRAVENOUS

## 2013-05-29 MED ORDER — BISACODYL 5 MG PO TBEC
10.0000 mg | DELAYED_RELEASE_TABLET | Freq: Every day | ORAL | Status: DC
  Administered 2013-05-29 – 2013-06-06 (×7): 10 mg via ORAL
  Filled 2013-05-29 (×8): qty 2

## 2013-05-29 MED ORDER — HEMOSTATIC AGENTS (NO CHARGE) OPTIME
TOPICAL | Status: DC | PRN
  Administered 2013-05-29: 1 via TOPICAL

## 2013-05-29 MED ORDER — 0.9 % SODIUM CHLORIDE (POUR BTL) OPTIME
TOPICAL | Status: DC | PRN
  Administered 2013-05-29 (×2): 2000 mL

## 2013-05-29 MED ORDER — SODIUM CHLORIDE 0.9 % IJ SOLN: 9.0000 mL | INTRAMUSCULAR | Status: DC | PRN

## 2013-05-29 MED ORDER — DEXTROSE 5 % IV SOLN
1.5000 g | Freq: Two times a day (BID) | INTRAVENOUS | Status: AC
  Administered 2013-05-29 – 2013-05-30 (×2): 1.5 g via INTRAVENOUS
  Filled 2013-05-29 (×3): qty 1.5

## 2013-05-29 SURGICAL SUPPLY — 76 items
APPLICATOR TIP EXT COSEAL (VASCULAR PRODUCTS) IMPLANT
BENZOIN TINCTURE PRP APPL 2/3 (GAUZE/BANDAGES/DRESSINGS) ×2 IMPLANT
CANISTER SUCTION 2500CC (MISCELLANEOUS) ×2 IMPLANT
CATH KIT ON Q 5IN SLV (PAIN MANAGEMENT) ×2 IMPLANT
CATH THORACIC 28FR (CATHETERS) IMPLANT
CATH THORACIC 36FR (CATHETERS) IMPLANT
CATH THORACIC 36FR RT ANG (CATHETERS) IMPLANT
CLIP TI MEDIUM 24 (CLIP) ×2 IMPLANT
CLIP TI MEDIUM 6 (CLIP) ×2 IMPLANT
CLIP TI WIDE RED SMALL 24 (CLIP) ×2 IMPLANT
CLSR STERI-STRIP ANTIMIC 1/2X4 (GAUZE/BANDAGES/DRESSINGS) ×2 IMPLANT
CONN ST 1/4X3/8  BEN (MISCELLANEOUS) ×2
CONN ST 1/4X3/8 BEN (MISCELLANEOUS) ×2 IMPLANT
CONN Y 3/8X3/8X3/8  BEN (MISCELLANEOUS) ×1
CONN Y 3/8X3/8X3/8 BEN (MISCELLANEOUS) ×1 IMPLANT
CONT SPEC 4OZ CLIKSEAL STRL BL (MISCELLANEOUS) ×4 IMPLANT
COVER SURGICAL LIGHT HANDLE (MISCELLANEOUS) ×4 IMPLANT
DERMABOND ADVANCED (GAUZE/BANDAGES/DRESSINGS) ×1
DERMABOND ADVANCED .7 DNX12 (GAUZE/BANDAGES/DRESSINGS) ×1 IMPLANT
DRAIN CHANNEL 32F RND 10.7 FF (WOUND CARE) ×4 IMPLANT
DRAPE LAPAROSCOPIC ABDOMINAL (DRAPES) ×2 IMPLANT
DRILL BIT 5/64 (BIT) IMPLANT
DRILL BIT 7/64X5 (BIT) ×2 IMPLANT
DRSG TEGADERM 4X4.75 (GAUZE/BANDAGES/DRESSINGS) ×2 IMPLANT
ELECT REM PT RETURN 9FT ADLT (ELECTROSURGICAL) ×2
ELECTRODE REM PT RTRN 9FT ADLT (ELECTROSURGICAL) ×1 IMPLANT
GLOVE BIO SURGEON STRL SZ 6.5 (GLOVE) ×2 IMPLANT
GLOVE BIOGEL PI IND STRL 6.5 (GLOVE) ×1 IMPLANT
GLOVE BIOGEL PI IND STRL 7.0 (GLOVE) ×2 IMPLANT
GLOVE BIOGEL PI INDICATOR 6.5 (GLOVE) ×1
GLOVE BIOGEL PI INDICATOR 7.0 (GLOVE) ×2
GLOVE EUDERMIC 7 POWDERFREE (GLOVE) ×6 IMPLANT
GOWN PREVENTION PLUS XLARGE (GOWN DISPOSABLE) ×2 IMPLANT
GOWN STRL NON-REIN LRG LVL3 (GOWN DISPOSABLE) ×4 IMPLANT
HANDLE STAPLE ENDO GIA SHORT (STAPLE) ×1
HANDLE UNIV ENDO GIA (ENDOMECHANICALS) IMPLANT
HEMOSTAT SURGICEL 2X14 (HEMOSTASIS) ×2 IMPLANT
KIT BASIN OR (CUSTOM PROCEDURE TRAY) ×2 IMPLANT
KIT ROOM TURNOVER OR (KITS) ×2 IMPLANT
NS IRRIG 1000ML POUR BTL (IV SOLUTION) ×8 IMPLANT
PACK CHEST (CUSTOM PROCEDURE TRAY) ×2 IMPLANT
PAD ARMBOARD 7.5X6 YLW CONV (MISCELLANEOUS) ×4 IMPLANT
RELOAD EGIA 45 MED/THCK PURPLE (STAPLE) ×4 IMPLANT
RELOAD EGIA 60 MED/THCK PURPLE (STAPLE) ×2 IMPLANT
RELOAD EGIA TRIS TAN 45 CVD (STAPLE) ×10 IMPLANT
SEALANT SURG COSEAL 4ML (VASCULAR PRODUCTS) ×2 IMPLANT
SEALANT SURG COSEAL 8ML (VASCULAR PRODUCTS) IMPLANT
SOLUTION ANTI FOG 6CC (MISCELLANEOUS) ×2 IMPLANT
SPONGE GAUZE 4X4 12PLY (GAUZE/BANDAGES/DRESSINGS) ×4 IMPLANT
SPONGE INTESTINAL PEANUT (DISPOSABLE) ×2 IMPLANT
STAPLER ENDO GIA 12MM SHORT (STAPLE) ×1 IMPLANT
STAPLER TA30 4.8 NON-ABS (STAPLE) ×2 IMPLANT
SUT PROLENE 3 0 SH DA (SUTURE) IMPLANT
SUT PROLENE 4 0 RB 1 (SUTURE)
SUT PROLENE 4-0 RB1 .5 CRCL 36 (SUTURE) IMPLANT
SUT SILK  1 MH (SUTURE) ×2
SUT SILK 1 MH (SUTURE) ×2 IMPLANT
SUT SILK 2 0SH CR/8 30 (SUTURE) IMPLANT
SUT SILK 3 0SH CR/8 30 (SUTURE) ×2 IMPLANT
SUT VIC AB 1 CTX 18 (SUTURE) ×2 IMPLANT
SUT VIC AB 1 CTX 36 (SUTURE) ×1
SUT VIC AB 1 CTX36XBRD ANBCTR (SUTURE) ×1 IMPLANT
SUT VIC AB 2-0 CTX 36 (SUTURE) ×2 IMPLANT
SUT VIC AB 3-0 X1 27 (SUTURE) ×4 IMPLANT
SUT VICRYL 2 TP 1 (SUTURE) ×2 IMPLANT
SWAB COLLECTION DEVICE MRSA (MISCELLANEOUS) ×2 IMPLANT
SYSTEM SAHARA CHEST DRAIN RE-I (WOUND CARE) ×2 IMPLANT
TAPE CLOTH 4X10 WHT NS (GAUZE/BANDAGES/DRESSINGS) ×2 IMPLANT
TAPE CLOTH SURG 6X10 WHT LF (GAUZE/BANDAGES/DRESSINGS) ×4 IMPLANT
TIP APPLICATOR SPRAY EXTEND 16 (VASCULAR PRODUCTS) IMPLANT
TOWEL OR 17X24 6PK STRL BLUE (TOWEL DISPOSABLE) ×4 IMPLANT
TOWEL OR 17X26 10 PK STRL BLUE (TOWEL DISPOSABLE) ×4 IMPLANT
TRAY FOLEY CATH 14FRSI W/METER (CATHETERS) ×2 IMPLANT
TUBE ANAEROBIC SPECIMEN COL (MISCELLANEOUS) ×2 IMPLANT
TUNNELER SHEATH ON-Q 11GX8 DSP (PAIN MANAGEMENT) ×2 IMPLANT
WATER STERILE IRR 1000ML POUR (IV SOLUTION) ×2 IMPLANT

## 2013-05-29 NOTE — Op Note (Addendum)
CARDIOTHORACIC SURGERY OPERATIVE NOTE 05/29/2013 SKYELER SMOLA [id number]  Surgeon:  Alleen Borne, MD  First Assistant: Doree Fudge, PA-C    Preoperative Diagnosis:  Left upper lobe lung nodule with left hilar and aorto-pulmonary window adenopathy  Postoperative Diagnosis:  Non-small cell lung cancer with metastases to aorto-pulmonary window lymph nodes.   Procedure:  1.  Left muscle-sparing thoracotomy 2.  Left upper lobectomy 3.  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 There were enlarged mediastinal and hilar lymph nodes with the largest being a right paratracheal node measuring 10 mm. A PET scan showed the nodule to have an SUV of 2.4 suspicious for bronchogenic carcinoma. There is a 8 mm AP window lymph node with a SUV of 5.0. There is focal hypermetabolism in the left hilum with an SUV of 4.3 that is concerning for lymph node mets. The mediastinal lymph nodes are negative. It is certainly possible that these hypermetabolic lymph nodes are reactive in this heavy smoker with a small peripheral lung nodule. I think we should proceed with surgical resection and removal of the hilar and aorto-pulmonary lymph nodes. It is certainly possible that he does have stage II or IIIA disease and may need postop chemotherapy and he understands that. His pulmonary function tests are better than I would have expected with his heavy smoking history and certainly good enough to tolerate a left upper lobectomy. I reviewed all of this with the patient and his family including alternatives, benefits, and risks including but not limited to bleeding, infection, bronchial stump complications,  prolonged air leak, pleural space complications, respiratory failure and death.      Preparation:  The patient was seen in the preoperative holding area and the correct patient, correct operation were confirmed with the patient after reviewing the medical record and xrays. The left side of the chest was signed by me. The consent was signed by me. Preoperative antibiotics were given. A radial arterial line was placed by the anesthesia team. The patient was taken back to the operating room and positioned supine on the operating room table. After being placed under general endotracheal anesthesia by the anesthesia team using a double lumen tube a foley catheter was placed. Lower extremity SCD's were placed. A time out was taken and the correct patient, operation and operative side were confirmed with nursing and anesthesia staff. The patient was turned into the right lateral decubitus position with the left side up. The chest was prepped with betadine soap and solution and draped in the usual sterile manner. A surgical time-out was taken and the correct patient and operative procedure and operative side were confirmed with the nursing and anesthesia staff.   left muscle-sparing thoracotomy and left upper lobectomy:  The chest was entered through a lateral muscle-sparing thoracotomy incision. The pleural space was entered through the 5th ICS. The pleural space was unremarkable. The nodule was visible and palpable in the lateral aspect of the left upper lobe. The fissure was incomplete. I did not feel that I could safely do a wedge resection of the nodule without getting close to the hilum or getting very close to the nodule. There were firm palpable lymph  nodes in the hilum and in the aorto-pulmonary window. I removed the aorto-pulmonary lymph nodes and sent them for frozen section. Dr. Colonel Bald called the OR and said this showed metastatic non-small cell carcinoma. Given the patient's wish to treat this  aggressively and his good PFT's I decided to perform a left upper lobectomy with removal of all of the visible lymphadenopathy in the left hilum and A-P Window. The pulmonary artery branches to the left upper lobe were identified, encircled with a vessel loop and divided with a vascular stapler. This took a while due to the firm lymph nodes that were in the hilum adjacent to the pulmonary artery and its branches. The fissure was opened and the lingular branches were divided in a similar manner. The left superior pulmonary vein was divided using a vascular stapler. The fissure was completed using 60 mm medium purple staplers. The left upper lobe bronchus was identified. There were multiple hard lymph nodes adjacent to the bronchus and they were dissected free and taken with the specimen. The left upper lobe bronchus was encircled with a TA-30 stapler with 4.8 mm staples. The left lung was inflated and the lower lobe inflated easily. The stapler was fired and the bronchus divided with a knife. The specimen was sent to pathology. Some of the left hilar lymph nodes were sent a separate specimens. Hemostasis was complete. The chest was filled with warm saline and the bronchial stump tested at 30 cm pressure. There was no air leak from the bronchus but a few bubbles from the raw surface at the fissure. This area was coated with Coseal. The inferior pulmonary ligament was divided. The posterior mediastinal attachments to the left lower lobe were left intact to prevent torsion of the lobe.   Completion:  Two 67 F Blake drains were placed in the pleural space posteriorly and anteriorly. The ribs were reapproximated with # 2 vicryl pericostal sutures with the sutures placed through holes drilled in the 6th rib and placed around the 5th rib. The muscles were returned to their normal anatomic position. The subcutaneous tissue was closed with 2-0 vicryl suture and the skin with 3-0 vicryl subcuticular suture. The sponge,  needle, and instrument counts were correct according to the nurses. Dry sterile dressings were applied and the chest tubes were connected to pleurevac suction. The patient was turned into the supine position, extubated, and transferred to the PACU in satisfactory and stable condition.

## 2013-05-29 NOTE — Transfer of Care (Signed)
Immediate Anesthesia Transfer of Care Note  Patient: Jordan Mccullough  Procedure(s) Performed: Procedure(s): LEFT THOROCOTOMY WITH LEFT UPPER LOBE LOBECTOMY (Left)  Patient Location: PACU  Anesthesia Type:General  Level of Consciousness: awake, alert , oriented and patient cooperative  Airway & Oxygen Therapy: Patient Spontanous Breathing and Patient connected to face mask oxygen  Post-op Assessment: Report given to PACU RN, Post -op Vital signs reviewed and stable and Patient moving all extremities  Post vital signs: Reviewed and stable  Complications: No apparent anesthesia complications

## 2013-05-29 NOTE — Interval H&P Note (Signed)
History and Physical Interval Note:  05/29/2013 8:03 AM  Jordan Mccullough  has presented today for surgery, with the diagnosis of LUL NODULE  The various methods of treatment have been discussed with the patient and family. After consideration of risks, benefits and other options for treatment, the patient has consented to  Procedure(s) with comments: LEFT THOROCOTOMY WITH LOBECTOMY (Left) - LUL LOBECTOMY as a surgical intervention .  The patient's history has been reviewed, patient examined, no change in status, stable for surgery.  I have reviewed the patient's chart and labs.  Questions were answered to the patient's satisfaction.     Alleen Borne

## 2013-05-29 NOTE — H&P (Signed)
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 There were enlarged mediastinal and hilar lymph nodes with the largest being a right paratracheal node measuring 10 mm. A PET scan showed the nodule to have an SUV of 2.4 suspicious for bronchogenic carcinoma. There is a 8 mm AP window lymph node with a SUV of 5.0. There is focal hypermetabolism in the left hilum with an SUV of 4.3 that is concerning for lymph node mets. The patient reports that he is asymptomatic.   Past Medical History   Diagnosis Date  . Hyperlipidemia  . Diverticulosis  . Coronary artery disease  MI 1992, S/P PTCA; negative stress test in November 2011 with no ischemia.  . Peripheral vascular disease 1/14  4.8x4.6 cm infrarenal abdominal aortic fusiform aneurysm, 1.5 cm right common iliac artery aneurysm  . Myocardial infarction  . AAA (abdominal aortic aneurysm)   Past Surgical History   Procedure Laterality Date  . Ptca 1992  . Inguinal heriiorrhaphy bilaterally  . Colonoscopy w/ polypectomy 2003  negative 2010,due 2020; Dr Juanda Chance  . Pilonidal cyst excision  . Rotator cuff repair  Bilateral  . Cystoscopy/retrograde/ureteroscopy Bilateral 10/09/2012  Procedure: BILATERAL RETROGRADE bladder and urethral BIOPSY ; Surgeon: Milford Cage, MD; Location: [facility]; Service: Urology; Laterality: Bilateral; BILATERAL RETROGRADE  AND bladder and urethral BIOPSY  . Prostate biopsy N/A 10/09/2012  Procedure: PROSTATIC URETHRAL BIOPSY; Surgeon: Milford Cage, MD; Location: [facility]; Service: Urology; Laterality: N/A; PROSTATIC URETHRAL BIOPSY   Family History  Problem Relation Age of Onset  . Hypertension Mother  . Prostate cancer Father  Prostate cancer  . Cancer Father  . Lung cancer Sister  NON SMOKER  . Cancer Sister  . Hyperlipidemia Sister  . Diabetes Neg Hx  . Stroke Neg Hx  . Hyperlipidemia Brother  . Heart attack Brother  . Hypertension Brother   Social History   History  Substance Use Topics  . Smoking status: Current Some Day Smoker -- 1.00 packs/day for 20 years  Types: Cigarettes, Cigars  Last Attempt to Quit: 08/21/1990  . Smokeless tobacco: Never Used  Comment: QUIT 15 YEARS AGO  . Alcohol Use: 0.0 oz/week  14-28 Glasses of wine per week  Current Outpatient Prescriptions  Medication Sig Dispense Refill  . aspirin 81 MG tablet Take 81 mg by mouth daily.  . hyoscyamine (LEVSIN, ANASPAZ) 0.125 MG tablet Take 1 tablet (0.125 mg total) by mouth every 4 (four) hours as needed for cramping (bladder spasms). 40 tablet 4  . Multiple Vitamin (MULTIVITAMIN) tablet Take 1 tablet by mouth daily.  Marland Kitchen oxybutynin (DITROPAN) 5 MG tablet Take 1 tablet (5 mg total) by mouth every 6 (six) hours as needed (bladder spasms). 40 tablet 4  . oxyCODONE-acetaminophen (PERCOCET) 5-325 MG per tablet Take 1-2 tablets by mouth every 4 (four) hours as needed for pain. 60 tablet 0  . phenazopyridine (PYRIDIUM) 100 MG tablet Take 1 tablet (100 mg total) by mouth every 8 (eight) hours as needed for pain (Burning urination. Will turn urine and body fluids orange.). 30 tablet 1  .  senna-docusate (SENOKOT S) 8.6-50 MG per tablet Take 1 tablet by mouth 2 (two) times daily. 60 tablet 0  . simvastatin (ZOCOR) 20 MG tablet 2 by mouth at bedtime, LABS DUE 06/2013 60 tablet 2  No current facility-administered medications for this visit.   No Known Allergies   Review of Systems   Constitutional: Positive for unexpected weight change. Negative for fever, chills, activity change, appetite change and fatigue.  Has lost about 10-15 lbs over the  past few years.  HENT: Negative.  Eyes: Negative.  Respiratory: Negative. Negative for cough, chest tightness and shortness of breath.  Cardiovascular: Negative. Negative for chest pain.  Gastrointestinal: Negative.  Endocrine: Negative.  Genitourinary: Negative.  Musculoskeletal: Negative.  Allergic/Immunologic: Negative.  Neurological: Negative.  Hematological: Negative.  Psychiatric/Behavioral: Negative.   BP 142/91  Pulse 57  Temp(Src) 98.2 F (36.8 C) (Oral)  Resp 18  Ht 5\' 5"  (1.651 m)  Wt 139 lb 14.4 oz (63.458 kg)  BMI 23.28 kg/m2   Physical Exam   Constitutional: He is oriented to person, place, and time. He appears well-developed and well-nourished. No distress.  Smells like cigarettes.  HENT:  Head: Normocephalic and atraumatic.  Mouth/Throat: Oropharynx is clear and moist.  Eyes: Conjunctivae and EOM are normal. Pupils are equal, round, and reactive to light.  Neck: No JVD present. No thyromegaly present.  Cardiovascular: Normal rate, regular rhythm, normal heart sounds and intact distal pulses.  No murmur heard.  Pulmonary/Chest: Effort normal and breath sounds normal. No respiratory distress. He has no rales.  Abdominal: Soft. Bowel sounds are normal. He exhibits no distension and no mass. There is no tenderness.  Musculoskeletal: Normal range of motion. He exhibits no edema.  Lymphadenopathy:  He has no cervical adenopathy.  Neurological: He is alert and oriented to person, place, and time. He has normal strength. No cranial nerve deficit or sensory deficit.  Skin: Skin is dry.  Psychiatric: He has a normal mood and affect.   Diagnostic Tests:   *RADIOLOGY REPORT*  Clinical Data: AAA evaluation  CT ANGIOGRAPHY CHEST, ABDOMEN AND PELVIS  Technique: Multidetector CT imaging through the chest, abdomen and  pelvis was performed using the standard protocol during bolus  administration of intravenous contrast. Multiplanar reconstructed  images including  MIPs were obtained and reviewed to evaluate the  vascular anatomy.  Contrast: 80mL OMNIPAQUE IOHEXOL 350 MG/ML SOLN,  Comparison: Prior CT scan of the abdomen and pelvis 08/30/2012  CTA CHEST  Findings:  Mediastinum: Unremarkable CT appearance of the thyroid gland. No  suspicious mediastinal or hilar adenopathy. Nonspecific, not  enlarged by CT criteria mediastinal and hilar lymph nodes may be  reactive. The largest right paratracheal node measures 10 mm in  short axis. No soft tissue mediastinal mass. The thoracic  esophagus is unremarkable.  Heart/Vascular: There is a bovine configuration of the aortic arch  (two vessel arch with common origin of the brachiocephalic and left  common carotid arteries), a normal anatomic variant. No aneurysmal  dilatation, intramural hematoma or dissection. The descending  thoracic aorta is tortuous. Scattered atherosclerotic vascular  calcifications are noted without significant stenosis. The heart  is within normal limits for size. There are atherosclerotic  calcifications noted in the left anterior descending, left main,  circumflex and right coronary arteries. No pericardial effusion.  Lungs/ the Pleura: Trace centrilobular emphysematous changes noted  in the apices. Irregular mildly spiculated nodule in the periphery  of the left upper lobe measures approximately 11 x 12 mm in  greatest  dimension (best measured on sagittal and coronal  reformats. There is an adjacent small calcified subpleural lymph  node or granuloma. No additional suspicious pulmonary nodules  identified.  Bones: Soft tissue anchors in the left humeral head suggest prior  rotator cuff repair. No acute fracture or aggressive appearing  lytic or blastic osseous lesion. Prominent Schmorl's node versus  minimal compression fracture of the T8 vertebral body. The overall  heart degenerative disc disease are relatively mild severity.  Review of the MIP images confirms the above  findings.  IMPRESSION:  1. Spiculated 11 x 12 mm peripheral left upper lobe pulmonary  nodule is concerning for primary bronchogenic carcinoma. Recommend  further evaluation with PET CT.  2. Trace biapical emphysematous changes.  3. Atherosclerosis including the left main and three-vessel  coronary artery disease.  These results were called by telephone on 04/14/2013 at 10:15  a.m. to Dr. Myra Gianotti, who verbally acknowledged these results.  CTA ABDOMEN AND PELVIS  Findings:  VASCULAR  Aorta: Fusiform aneurysmal dilatation of the infrarenal abdominal  aorta with a maximal diameter of 4.8 x 4.6 cm. There is crescentic  smooth wall adherent thrombus.  Celiac: Widely patent. Conventional hepatic arterial anatomy.  SMA: Widely patent.  Renals: One left and three-view right sided renal arteries. The  two lower pole accessory renal arteries arise from the distal aorta  just proximal to the bifurcation and distal to the aneurysmal  segment. The vessel supplied approximately 25% of the overall  renal parenchyma. Mild atherosclerotic calcification at the  origins without significant stenosis. No changes of fibromuscular  dysplasia.  IMA: Widely patent.  Inflow: Ectatic common iliac arteries with scattered  atherosclerotic calcifications but no focal stenosis. The right  common iliac artery measures up to 1.5 cm. The left common iliac  artery measures up to 1.7 cm. There is a small penetrating ulcer  arising off the anterior and inferior aspect of the left common  iliac artery. Bilateral hypogastric arteries are patent.  Fusiform ectasia of the left hypogastric artery shortly before the  bifurcation measuring up to 1.3 cm. The external iliac arteries  demonstrate mild atherosclerotic vascular disease without  significant stenosis.  Proximal Outflow: Calcified plaque along the posterior wall of the  bilateral common femoral arteries without significant stenosis.  The visualized superficial  profunda femoral arteries are widely  patent.  Veins: No focal venous abnormality detected.  NON-VASCULAR  Abdomen: Unremarkable CT appearance of the stomach, duodenum,  spleen, adrenal glands, pancreas and liver. There is a solitary  nonspecific sub centimeter hypoattenuating lesion in the mid right  hepatic lobe which is too small to accurately characterize but  highly likely a benign cyst. Cholelithiasis without acute  cholecystitis.  Multiple renal cortical cysts bilaterally. No enhancing renal  mass. No hydronephrosis or nephrolithiasis. Extrarenal pelvis on  the right.  Normal-caliber large and small bowel throughout the abdomen. No  evidence of bowel obstruction or focal bowel wall thickening.  Scattered colonic diverticula without evidence of active  inflammation. Normal appendix in the right lower quadrant. No  free fluid or suspicious adenopathy.  Pelvis: Small bilateral fat containing inguinal hernias. The  prostate gland is enlarged at 5.8 cm in transverse diameter. The  bladder is distended and mildly thick-walled. No free fluid or  suspicious adenopathy.  Bones: No acute fracture or aggressive appearing lytic or blastic  osseous lesion. Mild multilevel degenerative disc disease.  Review of the MIP images confirms the above findings.  IMPRESSION:  1. Fusiform infrarenal abdominal aortic  aneurysm with maximal  dimensions of 4.8 x 4.6 cm.  2. Ectatic bilateral common iliac and left internal iliac arteries  as detailed above.  3. There are three right sided renal arteries including two lower  pole accessory arteries which arise off the distal aorta just  proximal to the bifurcation. These accessory arteries supply  approximately 25% above the overall right renal parenchymal volume.  4. Cholelithiasis.  5. Diverticulosis.  6. Bilateral fat containing inguinal hernias.  7. Prostatomegaly with enlarged and trabeculated bladder  suggestive of bladder outlet obstruction.   These results were called by telephone on 04/14/2013 at 10:15 a.m.  to Dr. Myra Gianotti, who verbally acknowledged these results.  Original Report Authenticated By: Malachy Moan, M.D.  *RADIOLOGY REPORT*  Clinical Data: Initial treatment strategy for solitary pulmonary  nodule.  NUCLEAR MEDICINE PET SKULL BASE TO THIGH  Fasting Blood Glucose: 99  Technique: 17.9 mCi F-18 FDG was injected intravenously. CT data  was obtained and used for attenuation correction and anatomic  localization only. (This was not acquired as a diagnostic CT  examination.) Additional exam technical data entered on  technologist worksheet.  Comparison: CTA chest abdomen pelvis dated 04/14/2013  Findings:  Neck: No hypermetabolic lymph nodes in the neck.  Chest: Spiculated 11 x 7 mm nodule in the lateral left upper lobe  (series 2/image 67), max SUV 2.4, suspicious for primary  bronchogenic neoplasm.  8 mm short-axis AP window node (series 2/image 70), max SUV 5.0,  suspicious for nodal metastasis.  Focal hypermetabolism in the left hilar region, max SUV 4.3 (PET  image 74), worrisome for additional metastasis but without  correlate on unenhanced CT.  Cardiomegaly. Coronary atherosclerosis.  Abdomen/Pelvis: No abnormal hypermetabolic activity within the  liver, pancreas, adrenal glands, or spleen.  No hypermetabolic lymph nodes in the abdomen or pelvis.  4.9 cm infrarenal abdominal aortic aneurysm. Atherosclerotic  calcifications of the abdominal aorta and branch vessels.  Cholelithiasis, without associated inflammatory changes. Left  renal cysts.  Prostatomegaly. Thick-walled bladder, possibly reflecting chronic  bladder outlet obstruction.  Small fat-containing left inguinal hernia. Small fat-containing  right inguinal hernia with mild soft tissue stranding and  associated hypermetabolism, max SUV 4.0 (PET image 197).  Skeleton: No focal hypermetabolic activity to suggest skeletal  metastasis.   IMPRESSION:  Spiculated 11 mm nodule in the lateral left upper lobe, max SUV  2.4, suspicious for primary bronchogenic neoplasm.  Suspected left hilar and AP window nodal metastases.  Mild hypermetabolism/stranding within a right inguinal hernia, of  uncertain significance, but likely unrelated to malignancy.  Additional ancillary findings as above.  Original Report Authenticated By: Charline Bills, M.D.   *RADIOLOGY REPORT*  Clinical Data: Lung cancer, staging.  MRI HEAD WITHOUT AND WITH CONTRAST  Technique: Multiplanar, multiecho pulse sequences of the brain and  surrounding structures were obtained according to standard protocol  without and with intravenous contrast  Contrast: 12mL MULTIHANCE GADOBENATE DIMEGLUMINE 529 MG/ML IV SOLN  Comparison: PET scan 04/17/2013.  Findings: There is no evidence for acute infarction, intracranial  hemorrhage, mass lesion, hydrocephalus, or extra-axial fluid. Mild  atrophy. Mild chronic microvascular ischemic change. Flow voids  are maintained. No midline abnormality. No osseous lesions.  Negative orbits, sinuses, and mastoids.  Post infusion, no abnormal enhancement of the brain or meninges.  No intracranial metastatic deposits are evident. Major venous  sinuses are patent. Extracranial soft tissues unremarkable.  IMPRESSION:  Chronic changes as described. No intracranial metastatic lesions  are evident.  Original Report Authenticated By: Jonny Ruiz  Curnes, M.D.   Impression:   He has a 12 mm irregular spiculated nodule in the periphery of the left upper lobe of the lung with hypermetabolic activity in the left hilar region and aorto-pulmonary window. The mediastinal lymph nodes are negative. It is certainly possible that these hypermetabolic lymph nodes are reactive in this heavy smoker with a small peripheral lung nodule. I think we should proceed with surgical resection and removal of the hilar and aorto-pulmonary lymph nodes. It is certainly  possible that he does have stage II or IIIA disease and may need postop chemotherapy and he understands that. His pulmonary function tests are better than I would have expected with his heavy smoking history and certainly good enough to tolerate a left upper lobectomy. I reviewed all of this with the patient and his family including alternatives, benefits, and risks including but not limited to bleeding, infection, bronchial stump complications, prolonged air leak, pleural space complications, respiratory failure and death.   Plan:   Left thoracotomy for wedge resection and possible left upper lobectomy and lymph node resection next Thursday.

## 2013-05-29 NOTE — Anesthesia Postprocedure Evaluation (Signed)
Anesthesia Post Note  Patient: Jordan Mccullough  Procedure(s) Performed: Procedure(s) (LRB): LEFT THOROCOTOMY WITH LEFT UPPER LOBE LOBECTOMY (Left)  Anesthesia type: general  Patient location: PACU  Post pain: Pain level controlled  Post assessment: Patient's Cardiovascular Status Stable  Last Vitals:  Filed Vitals:   05/29/13 1330  BP: 103/74  Pulse: 78  Temp:   Resp: 19    Post vital signs: Reviewed and stable  Level of consciousness: sedated  Complications: No apparent anesthesia complications

## 2013-05-29 NOTE — Anesthesia Preprocedure Evaluation (Signed)
Anesthesia Evaluation  Patient identified by MRN, date of birth, ID band Patient awake    Reviewed: Allergy & Precautions, H&P , NPO status , Patient's Chart, lab work & pertinent test results  Airway Mallampati: I TM Distance: >3 FB Neck ROM: Full    Dental   Pulmonary          Cardiovascular + CAD and + Past MI     Neuro/Psych    GI/Hepatic   Endo/Other    Renal/GU      Musculoskeletal   Abdominal   Peds  Hematology   Anesthesia Other Findings   Reproductive/Obstetrics                           Anesthesia Physical Anesthesia Plan  ASA: III  Anesthesia Plan: General   Post-op Pain Management:    Induction: Intravenous  Airway Management Planned: Oral ETT  Additional Equipment: Arterial line, CVP and Ultrasound Guidance Line Placement  Intra-op Plan:   Post-operative Plan: Extubation in OR  Informed Consent: I have reviewed the patients History and Physical, chart, labs and discussed the procedure including the risks, benefits and alternatives for the proposed anesthesia with the patient or authorized representative who has indicated his/her understanding and acceptance.     Plan Discussed with: CRNA and Surgeon  Anesthesia Plan Comments:         Anesthesia Quick Evaluation

## 2013-05-29 NOTE — Preoperative (Signed)
Beta Blockers   Reason not to administer Beta Blockers:Not Applicable 

## 2013-05-29 NOTE — Brief Op Note (Signed)
05/29/2013  1:19 PM  PATIENT:  Jordan Mccullough  72 y.o. male  PRE-OPERATIVE DIAGNOSIS:  Left upper lobe nodule   POST-OPERATIVE DIAGNOSIS:  Left upper lobe nodule   PROCEDURE:  Procedure(s): LEFT THOROCOTOMY WITH LEFT UPPER LOBE LOBECTOMY (Left)  SURGEON:  Surgeon(s) and Role:    * Alleen Borne, MD - Primary  PHYSICIAN ASSISTANT: Doree Fudge, PA-C    ANESTHESIA:   general  EBL:  Total I/O In: 1600 [I.V.:1600] Out: 950 [Urine:800; Blood:150]  BLOOD ADMINISTERED:none  DRAINS: (2 42F) Blake drain(s) in the left pleural space   LOCAL MEDICATIONS USED:  NONE  SPECIMEN:  Source of Specimen:  Left upper lobe, A/P window lymph nodes, left hilar lymph nodes  Frozen section of A/P window lymph nodes positive for non- small cell carcinoma DISPOSITION OF SPECIMEN:  PATHOLOGY  COUNTS:  YES     PLAN OF CARE: Admit to inpatient   PATIENT DISPOSITION:  PACU - hemodynamically stable.   Delay start of Pharmacological VTE agent (>24hrs) due to surgical blood loss or risk of bleeding: yes

## 2013-05-29 NOTE — Progress Notes (Signed)
Patient ID: Jordan Mccullough, male   DOB: 1940/12/17, 72 y.o.   MRN: [id number]  SICU Evening Rounds:  Hemodynamically stable  Awake and alert, comfortable  Urine output ok  CT output low. Small air leak stable

## 2013-05-29 NOTE — Anesthesia Procedure Notes (Signed)
Procedure Name: Intubation Date/Time: 05/29/2013 8:17 AM Performed by: Jerilee Hoh Pre-anesthesia Checklist: Patient identified, Emergency Drugs available, Suction available and Patient being monitored Patient Re-evaluated:Patient Re-evaluated prior to inductionOxygen Delivery Method: Circle system utilized Preoxygenation: Pre-oxygenation with 100% oxygen Intubation Type: IV induction Ventilation: Mask ventilation without difficulty Laryngoscope Size: Mac and 4 Grade View: Grade I Tube type: Oral Endobronchial tube: Left, EBT position confirmed by fiberoptic bronchoscope, Double lumen EBT and EBT position confirmed by auscultation and 39 Fr Number of attempts: 1 Airway Equipment and Method: Stylet Placement Confirmation: ETT inserted through vocal cords under direct vision,  positive ETCO2 and breath sounds checked- equal and bilateral Tube secured with: Tape Dental Injury: Teeth and Oropharynx as per pre-operative assessment

## 2013-05-30 ENCOUNTER — Inpatient Hospital Stay (HOSPITAL_COMMUNITY): Payer: Medicare Other

## 2013-05-30 ENCOUNTER — Encounter (HOSPITAL_COMMUNITY): Payer: Self-pay | Admitting: Surgery

## 2013-05-30 LAB — CBC
HCT: 36.7 % — ABNORMAL LOW (ref 39.0–52.0)
Hemoglobin: 12.7 g/dL — ABNORMAL LOW (ref 13.0–17.0)
MCH: 31.5 pg (ref 26.0–34.0)
MCHC: 34.6 g/dL (ref 30.0–36.0)
MCV: 91.1 fL (ref 78.0–100.0)
Platelets: 198 10*3/uL (ref 150–400)
RBC: 4.03 MIL/uL — ABNORMAL LOW (ref 4.22–5.81)
RDW: 14.9 % (ref 11.5–15.5)
WBC: 14.3 10*3/uL — ABNORMAL HIGH (ref 4.0–10.5)

## 2013-05-30 LAB — BLOOD GAS, ARTERIAL
Acid-Base Excess: 2.8 mmol/L — ABNORMAL HIGH (ref 0.0–2.0)
Bicarbonate: 27.6 mEq/L — ABNORMAL HIGH (ref 20.0–24.0)
Drawn by: 252031
O2 Content: 2 L/min
O2 Saturation: 95.8 %
Patient temperature: 98.6
TCO2: 29.1 mmol/L (ref 0–100)
pCO2 arterial: 49 mmHg — ABNORMAL HIGH (ref 35.0–45.0)
pH, Arterial: 7.37 (ref 7.350–7.450)
pO2, Arterial: 79.4 mmHg — ABNORMAL LOW (ref 80.0–100.0)

## 2013-05-30 LAB — BASIC METABOLIC PANEL
BUN: 8 mg/dL (ref 6–23)
CO2: 25 mEq/L (ref 19–32)
Calcium: 8.5 mg/dL (ref 8.4–10.5)
Chloride: 104 mEq/L (ref 96–112)
Creatinine, Ser: 0.57 mg/dL (ref 0.50–1.35)
GFR calc Af Amer: >90 mL/min (ref >90)
GFR calc non Af Amer: >90 mL/min (ref >90)
Glucose, Bld: 140 mg/dL — ABNORMAL HIGH (ref 70–99)
Potassium: 3.9 mEq/L (ref 3.5–5.1)
Sodium: 139 mEq/L (ref 135–145)

## 2013-05-30 MED ORDER — ONDANSETRON HCL 4 MG/2ML IJ SOLN: 4.0000 mg | Freq: Four times a day (QID) | INTRAMUSCULAR | Status: DC | PRN

## 2013-05-30 MED ORDER — DIPHENHYDRAMINE HCL 12.5 MG/5ML PO ELIX
12.5000 mg | ORAL_SOLUTION | Freq: Four times a day (QID) | ORAL | Status: DC | PRN
  Filled 2013-05-30: qty 5

## 2013-05-30 MED ORDER — POTASSIUM CHLORIDE 10 MEQ/50ML IV SOLN
10.0000 meq | INTRAVENOUS | Status: AC
  Administered 2013-05-30 (×3): 10 meq via INTRAVENOUS
  Filled 2013-05-30 (×3): qty 50

## 2013-05-30 MED ORDER — SODIUM CHLORIDE 0.9 % IJ SOLN: 9.0000 mL | INTRAMUSCULAR | Status: DC | PRN

## 2013-05-30 MED ORDER — FENTANYL 10 MCG/ML IV SOLN
INTRAVENOUS | Status: DC
  Administered 2013-05-30: 10:00:00 via INTRAVENOUS
  Administered 2013-05-30: 120 ug via INTRAVENOUS
  Administered 2013-05-30: 40 ug via INTRAVENOUS
  Administered 2013-05-31: 13:00:00 via INTRAVENOUS
  Administered 2013-05-31: 60 ug via INTRAVENOUS
  Administered 2013-05-31: 70 ug via INTRAVENOUS
  Administered 2013-05-31: 50 ug via INTRAVENOUS
  Administered 2013-05-31: 20 ug via INTRAVENOUS
  Administered 2013-05-31: 50 ug via INTRAVENOUS
  Administered 2013-05-31: 30 ug via INTRAVENOUS
  Administered 2013-06-01: 20 ug via INTRAVENOUS
  Administered 2013-06-01 (×2): 50 ug via INTRAVENOUS
  Filled 2013-05-30 (×2): qty 50

## 2013-05-30 MED ORDER — DIPHENHYDRAMINE HCL 50 MG/ML IJ SOLN
12.5000 mg | Freq: Four times a day (QID) | INTRAMUSCULAR | Status: DC | PRN
  Administered 2013-05-31: 12.5 mg via INTRAVENOUS
  Filled 2013-05-30: qty 1

## 2013-05-30 MED ORDER — NALOXONE HCL 0.4 MG/ML IJ SOLN: 0.4000 mg | INTRAMUSCULAR | Status: DC | PRN

## 2013-05-30 MED ORDER — METOCLOPRAMIDE HCL 5 MG/ML IJ SOLN
10.0000 mg | Freq: Four times a day (QID) | INTRAMUSCULAR | Status: AC
  Administered 2013-05-30 – 2013-05-31 (×4): 10 mg via INTRAVENOUS
  Filled 2013-05-30 (×4): qty 2

## 2013-05-30 NOTE — Progress Notes (Signed)
5.7 mg Dilaudid wasted (19mL) from PCA syringe. Witnessed by Glenard Haring, RN.  Jordan Mccullough

## 2013-05-30 NOTE — Progress Notes (Signed)
Utilization Review Completed.Maycee Blasco T10/05/2013  

## 2013-05-30 NOTE — Care Management Note (Signed)
    Page 1 of 1   05/30/2013     11:52:57 AM   CARE MANAGEMENT NOTE 05/30/2013  Patient:  Jordan Mccullough, Jordan Mccullough   Account Number:  [id number]  Date Initiated:  05/30/2013  Documentation initiated by:  [facility]  Subjective/Objective Assessment:   Post op thoracotomy.     Action/Plan:   Anticipated DC Date:  06/03/2013   Anticipated DC Plan:  HOME W HOME HEALTH SERVICES      DC Planning Services  CM consult      Choice offered to / List presented to:             Status of service:  In process, will continue to follow Medicare Important Message given?   (If response is "NO", the following Medicare IM given date fields will be blank) Date Medicare IM given:   Date Additional Medicare IM given:    Discharge Disposition:    Per UR Regulation:  Reviewed for med. necessity/level of care/duration of stay  If discussed at Long Length of Stay Meetings, dates discussed:    Comments:  ContactCorbet, Hanley Spouse [phone]   [phone]  05-30-13 11am Avie Arenas, RNBSN [phone] Talked with patient sitting up in chair.  States lives at home with wife who is with him 24/7 and was independent prior to admission.  Hopes to be able to return home.  CM will continue to follow for further needs.

## 2013-05-30 NOTE — Progress Notes (Signed)
Patient ID: Jordan Mccullough, male   DOB: [birth year], 72 y.o.   MRN: [id number] EVENING ROUNDS NOTE :     [address]       [place] [address]             [phone]                 1 Day Post-Op Procedure(s) (LRB): LEFT THOROCOTOMY WITH LEFT UPPER LOBE LOBECTOMY (Left)  Total Length of Stay:  LOS: 1 day  BP 110/69  Pulse 75  Temp(Src) 97.8 F (36.6 C) (Oral)  Resp 15  Ht 5\' 5"  (1.651 m)  Wt 144 lb (65.318 kg)  BMI 23.96 kg/m2  SpO2 98%  .Intake/Output     10/09 0701 - 10/10 0700 10/10 0701 - 10/11 0700   I.V. (mL/kg) 2408.6 (36.9) 250 (3.8)   IV Piggyback 50 200   Total Intake(mL/kg) 2458.6 (37.6) 450 (6.9)   Urine (mL/kg/hr) 2250 (1.4) 1450 (2.2)   Blood 150 (0.1)    Chest Tube 450 (0.3) 150 (0.2)   Total Output 2850 1600   Net -391.4 -1150          . dextrose 5 % and 0.45% NaCl 50 mL/hr at 05/29/13 1500     Lab Results  Component Value Date   WBC 14.3* 05/30/2013   HGB 12.7* 05/30/2013   HCT 36.7* 05/30/2013   PLT 198 05/30/2013   GLUCOSE 140* 05/30/2013   CHOL 186 07/03/2012   TRIG 84.0 07/03/2012   HDL 54.40 07/03/2012   LDLCALC 115* 07/03/2012   ALT 30 05/27/2013   AST 28 05/27/2013   NA 139 05/30/2013   K 3.9 05/30/2013   CL 104 05/30/2013   CREATININE 0.57 05/30/2013   BUN 8 05/30/2013   CO2 25 05/30/2013   TSH 1.20 08/09/2011   PSA 6.47* 07/03/2012   INR 0.95 05/27/2013   HGBA1C 5.6 07/03/2012   MICROALBUR 0.9 07/02/2007   Stable day Small air leak  Delight Ovens MD  Beeper [phone] Office [phone] 05/30/2013 5:17 PM

## 2013-05-30 NOTE — Clinical Documentation Improvement (Signed)
THIS DOCUMENT IS NOT A PERMANENT PART OF THE MEDICAL RECORD  Please update your documentation with the medical record to reflect your response to this query. If you need help knowing how to do this please call [phone].  05/30/13   Dear Dr.Bartle / Associates,  In a better effort to capture your patient's severity of illness, reflect appropriate length of stay and utilization of resources, a review of the patient medical record has revealed the following indicators.    Based on your clinical judgment, please clarify and document in a progress note and/or discharge summary the clinical condition associated with the following supporting information:  In responding to this query please exercise your independent judgment.  The fact that a query is asked, does not imply that any particular answer is desired or expected.    Possible Clinical Conditions?  "    Atelectasis  "    Other Condition  "    Cannot Clinically Determine     Supporting Information:  Diagnostics:  CXR results: 10/09:  Basilar atelectasis is present. 10/10:  Mild stable basilar atelectasis.  Treatment:  Incentive spirometery.   You may use possible, probable, or suspect with inpatient documentation. possible, probable, suspected diagnoses MUST be documented at the time of discharge  Reviewed:  Additional documentation provided per 10/11 progress notes.                                  Thank You,  Marciano Sequin,  Clinical Documentation Specialist: [phone] Health Information Management [place]

## 2013-05-30 NOTE — Progress Notes (Addendum)
1 Day Post-Op Procedure(s) (LRB): LEFT THOROCOTOMY WITH LEFT UPPER LOBE LOBECTOMY (Left) Subjective: Nausea that is mild. Thinks it may be pain meds.  Objective: Vital signs in last 24 hours: Temp:  [97.7 F (36.5 C)-99 F (37.2 C)] 97.7 F (36.5 C) (10/10 0710) Pulse Rate:  [69-87] 79 (10/10 0500) Cardiac Rhythm:  [-] Normal sinus rhythm (10/10 0000) Resp:  [7-24] 19 (10/10 0500) BP: (92-130)/(50-77) 112/64 mmHg (10/10 0500) SpO2:  [92 %-100 %] 95 % (10/10 0500) Arterial Line BP: (81-126)/(46-103) 109/63 mmHg (10/10 0500) Weight:  [65.318 kg (144 lb)] 65.318 kg (144 lb) (10/09 1800)  Hemodynamic parameters for last 24 hours:    Intake/Output from previous day: 10/09 0701 - 10/10 0700 In: 2358.6 [I.V.:2308.6; IV Piggyback:50] Out: 2850 [Urine:2250; Blood:150; Chest Tube:450] Intake/Output this shift:    General appearance: alert Neurologic: intact Heart: regular rate and rhythm, S1, S2 normal, no murmur, click, rub or gallop Lungs: clear to auscultation bilaterally Extremities: extremities normal, atraumatic, no cyanosis or edema Wound: dressing dry Chest tube with 1+ intermittent air leak  Lab Results:  Recent Labs  05/27/13 1041 05/30/13 0430  WBC 5.1 14.3*  HGB 14.4 12.7*  HCT 40.5 36.7*  PLT 202 198   BMET:  Recent Labs  05/27/13 1041 05/30/13 0430  NA 135 139  K 4.1 3.9  CL 101 104  CO2 21 25  GLUCOSE 102* 140*  BUN 13 8  CREATININE 0.62 0.57  CALCIUM 9.4 8.5    PT/INR:  Recent Labs  05/27/13 1041  LABPROT 12.5  INR 0.95   ABG    Component Value Date/Time   PHART 7.370 05/30/2013 0416   HCO3 27.6* 05/30/2013 0416   TCO2 29.1 05/30/2013 0416   ACIDBASEDEF 0.3 05/27/2013 1041   O2SAT 95.8 05/30/2013 0416   CBG (last 3)  No results found for this basename: GLUCAP,  in the last 72 hours  CLINICAL DATA: Status post left thoracotomy with left upper lobe  resection.  EXAM:  PORTABLE CHEST - 1 VIEW  COMPARISON: 05/29/2013  FINDINGS:   Dual left chest tubes are stable with their tips at the left apex.  No pneumothorax. Volume loss on the left subsequent to the left  upper lobectomy is stable.  Mild basilar atelectasis is stable. No convincing infiltrate or  pulmonary edema.  Right internal jugular central venous line tip lies just above the  cavoatrial junction, also stable.  IMPRESSION:  1. No change from the previous day's study.  2. Support apparatus is well positioned.  3. No pneumothorax. Mild stable basilar atelectasis.  Electronically Signed  By: Amie Portland M.D.  On: 05/30/2013 07:23   Assessment/Plan: S/P Procedure(s) (LRB): LEFT THOROCOTOMY WITH LEFT UPPER LOBE LOBECTOMY (Left) He is doing well overall. Will give some Reglan for nausea and switch PCA to fentanyl CT to 10 suction Mobilize Continue foley due to patient in ICU and urinary output monitoring   LOS: 1 day    BARTLE,BRYAN K 05/30/2013

## 2013-05-31 MED ORDER — INFLUENZA VAC SPLIT QUAD 0.5 ML IM SUSP
0.5000 mL | INTRAMUSCULAR | Status: AC | PRN
  Administered 2013-06-06: 0.5 mL via INTRAMUSCULAR
  Filled 2013-05-31: qty 0.5

## 2013-05-31 MED ORDER — ZOLPIDEM TARTRATE 5 MG PO TABS: 5.0000 mg | ORAL_TABLET | Freq: Every evening | ORAL | Status: DC | PRN

## 2013-05-31 MED ORDER — DIPHENHYDRAMINE HCL 12.5 MG/5ML PO ELIX
12.5000 mg | ORAL_SOLUTION | Freq: Every evening | ORAL | Status: DC | PRN
  Administered 2013-06-01 – 2013-06-05 (×5): 12.5 mg via ORAL
  Filled 2013-05-31 (×5): qty 5

## 2013-05-31 MED ORDER — PNEUMOCOCCAL VAC POLYVALENT 25 MCG/0.5ML IJ INJ
0.5000 mL | INJECTION | INTRAMUSCULAR | Status: AC | PRN
  Administered 2013-06-06: 0.5 mL via INTRAMUSCULAR
  Filled 2013-05-31: qty 0.5

## 2013-05-31 MED ORDER — DIPHENHYDRAMINE HCL 12.5 MG/5ML PO ELIX: 12.5000 mg | ORAL_SOLUTION | Freq: Every evening | ORAL | Status: DC | PRN

## 2013-05-31 NOTE — Progress Notes (Signed)
2 Days Post-Op Procedure(s) (LRB): LEFT THOROCOTOMY WITH LEFT UPPER LOBE LOBECTOMY (Left) Subjective: Nausea resolved. Good pain control  Objective: Vital signs in last 24 hours: Temp:  [97.8 F (36.6 C)-98.6 F (37 C)] 98.5 F (36.9 C) (10/11 0723) Pulse Rate:  [73-84] 84 (10/11 0800) Cardiac Rhythm:  [-] Normal sinus rhythm (10/11 0800) Resp:  [13-22] 17 (10/11 0800) BP: (108-134)/(64-84) 133/82 mmHg (10/11 0800) SpO2:  [98 %-100 %] 99 % (10/11 0800) Weight:  [143 lb 11.8 oz (65.2 kg)] 143 lb 11.8 oz (65.2 kg) (10/11 0600)  Hemodynamic parameters for last 24 hours:    Intake/Output from previous day: 10/10 0701 - 10/11 0700 In: 1903 [P.O.:480; I.V.:1223; IV Piggyback:200] Out: 3700 [Urine:3450; Chest Tube:250] Intake/Output this shift: Total I/O In: 50 [I.V.:50] Out: 325 [Urine:325]  General appearance: alert Neurologic: intact Heart: regular rate and rhythm, S1, S2 normal, no murmur, click, rub or gallop Lungs: clear to auscultation bilaterally Extremities: extremities normal, atraumatic, no cyanosis or edema Wound: dressing dry Chest tube with 1+ intermittent air leak  Lab Results:  Recent Labs  05/30/13 0430  WBC 14.3*  HGB 12.7*  HCT 36.7*  PLT 198   BMET:   Recent Labs  05/30/13 0430  NA 139  K 3.9  CL 104  CO2 25  GLUCOSE 140*  BUN 8  CREATININE 0.57  CALCIUM 8.5    PT/INR: No results found for this basename: LABPROT, INR,  in the last 72 hours ABG    Component Value Date/Time   PHART 7.370 05/30/2013 0416   HCO3 27.6* 05/30/2013 0416   TCO2 29.1 05/30/2013 0416   ACIDBASEDEF 0.3 05/27/2013 1041   O2SAT 95.8 05/30/2013 0416   CBG (last 3)  No results found for this basename: GLUCAP,  in the last 72 hours  CLINICAL DATA: Status post left thoracotomy with left upper lobe  resection.  EXAM:  PORTABLE CHEST - 1 VIEW  COMPARISON: 05/29/2013  FINDINGS:  Dual left chest tubes are stable with their tips at the left apex.  No  pneumothorax. Volume loss on the left subsequent to the left  upper lobectomy is stable.  Mild basilar atelectasis is stable. No convincing infiltrate or  pulmonary edema.  Right internal jugular central venous line tip lies just above the  cavoatrial junction, also stable.  IMPRESSION:  1. No change from the previous day's study.  2. Support apparatus is well positioned.  3. No pneumothorax. Mild stable basilar atelectasis.  Electronically Signed  By: Amie Portland M.D.  On: 05/30/2013 07:23   Assessment/Plan: S/P Procedure(s) (LRB): LEFT THOROCOTOMY WITH LEFT UPPER LOBE LOBECTOMY (Left) He is doing well overall.  CT to 10 suction, leave chest tube in D/c foley    LOS: 2 days    Tiffanee Mcnee B 05/31/2013

## 2013-06-01 ENCOUNTER — Inpatient Hospital Stay (HOSPITAL_COMMUNITY): Payer: Medicare Other

## 2013-06-01 LAB — BASIC METABOLIC PANEL
BUN: 9 mg/dL (ref 6–23)
CO2: 28 mEq/L (ref 19–32)
Calcium: 8.9 mg/dL (ref 8.4–10.5)
Chloride: 101 mEq/L (ref 96–112)
Creatinine, Ser: 0.55 mg/dL (ref 0.50–1.35)
GFR calc Af Amer: >90 mL/min (ref >90)
GFR calc non Af Amer: >90 mL/min (ref >90)
Glucose, Bld: 111 mg/dL — ABNORMAL HIGH (ref 70–99)
Potassium: 3.6 mEq/L (ref 3.5–5.1)
Sodium: 137 mEq/L (ref 135–145)

## 2013-06-01 LAB — CBC
HCT: 33.9 % — ABNORMAL LOW (ref 39.0–52.0)
Hemoglobin: 11.8 g/dL — ABNORMAL LOW (ref 13.0–17.0)
MCH: 31.3 pg (ref 26.0–34.0)
MCHC: 34.8 g/dL (ref 30.0–36.0)
MCV: 89.9 fL (ref 78.0–100.0)
Platelets: 200 10*3/uL (ref 150–400)
RBC: 3.77 MIL/uL — ABNORMAL LOW (ref 4.22–5.81)
RDW: 14.5 % (ref 11.5–15.5)
WBC: 7.9 10*3/uL (ref 4.0–10.5)

## 2013-06-01 MED ORDER — POTASSIUM CHLORIDE 10 MEQ/50ML IV SOLN
10.0000 meq | INTRAVENOUS | Status: AC | PRN
  Administered 2013-06-01 (×3): 10 meq via INTRAVENOUS
  Filled 2013-06-01 (×3): qty 50

## 2013-06-01 NOTE — Progress Notes (Signed)
Patient ID: Jordan Mccullough, male   DOB: 05-01-1941, 72 y.o.   MRN: [id number] [facility] DAILY ICU PROGRESS NOTE                   [address]            [place] [address]          [phone]   3 Days Post-Op Procedure(s) (LRB): LEFT THOROCOTOMY WITH LEFT UPPER LOBE LOBECTOMY (Left)  Total Length of Stay:  LOS: 3 days   Subjective: Good resp effort , no complaints this am, good pain control  Objective: Vital signs in last 24 hours: Temp:  [97.5 F (36.4 C)-98.4 F (36.9 C)] 97.5 F (36.4 C) (10/12 0732) Pulse Rate:  [80-99] 92 (10/12 0900) Cardiac Rhythm:  [-] Normal sinus rhythm (10/12 0800) Resp:  [11-26] 21 (10/12 0900) BP: (90-160)/(58-132) 135/80 mmHg (10/12 0900) SpO2:  [96 %-100 %] 100 % (10/12 0900)  Filed Weights   05/29/13 1800 05/31/13 0600  Weight: 144 lb (65.318 kg) 143 lb 11.8 oz (65.2 kg)    Weight change:    Hemodynamic parameters for last 24 hours:    Intake/Output from previous day: 10/11 0701 - 10/12 0700 In: 1470 [P.O.:960; I.V.:510] Out: 4285 [Urine:4200; Chest Tube:85]  Intake/Output this shift: Total I/O In: 390 [P.O.:240; IV Piggyback:150] Out: 50 [Chest Tube:50]  Current Meds: Scheduled Meds: . bisacodyl  10 mg Oral Daily  . fentaNYL   Intravenous Q4H  . nicotine  21 mg Transdermal Daily   Continuous Infusions: . dextrose 5 % and 0.45% NaCl 20 mL/hr (05/31/13 1614)   PRN Meds:.diphenhydrAMINE, diphenhydrAMINE, diphenhydrAMINE, influenza vac split quadrivalent PF, ketorolac, naloxone, ondansetron (ZOFRAN) IV, oxyCODONE-acetaminophen, oxyCODONE-acetaminophen, pneumococcal 23 valent vaccine, potassium chloride, potassium chloride, senna-docusate, sodium chloride, traMADol  General appearance: alert and cooperative Neurologic: intact Heart: regular rate and rhythm, S1, S2 normal, no murmur, click, rub or gallop Lungs: diminished breath sounds bibasilar Abdomen: soft, non-tender; bowel sounds normal; no masses,  no  organomegaly Extremities: extremities normal, atraumatic, no cyanosis or edema and Homans sign is negative, no sign of DVT Wound: small air leak  Lab Results: CBC: Recent Labs  05/30/13 0430 06/01/13 0400  WBC 14.3* 7.9  HGB 12.7* 11.8*  HCT 36.7* 33.9*  PLT 198 200   BMET:  Recent Labs  05/30/13 0430 06/01/13 0400  NA 139 137  K 3.9 3.6  CL 104 101  CO2 25 28  GLUCOSE 140* 111*  BUN 8 9  CREATININE 0.57 0.55  CALCIUM 8.5 8.9    PT/INR: No results found for this basename: LABPROT, INR,  in the last 72 hours Radiology: Dg Chest Port 1 View  06/01/2013   CLINICAL DATA:  Postop. Check chest tubes.  EXAM: PORTABLE CHEST - 1 VIEW  COMPARISON:  05/30/2013.  FINDINGS: There is a tiny pneumothorax which has developed since the prior study. Subcutaneous air on the left has mildly decreased.  Dual left chest tubes are stable. The right lung remains essentially clear. No right pneumothorax.  Right internal jugular central venous line is stable with its tip in the lower superior vena cava.  IMPRESSION: New minimal left pneumothorax.  Support apparatus is stable. No other change.   Electronically Signed   By: Amie Portland M.D.   On: 06/01/2013 07:39     Assessment/Plan: S/P Procedure(s) (LRB): LEFT THOROCOTOMY WITH LEFT UPPER LOBE LOBECTOMY (Left) Mobilize to 3300 Small PTX with air leak will leave ct in place and  on suction HCT and renal function stable  Lelani Garnett B 06/01/2013 9:44 AM

## 2013-06-01 NOTE — Progress Notes (Signed)
Pt transferred to 3S01. Pt ambulated distance to new unit, pushing wheelchair, on portable tele, room air. Pt glasses, belongings taken to new room. Pt tolerated transfer well. Wife present for transfer. Receiving RN present on arrival, placed pt on receiving units tele monitor. Will continue to monitor. Koren Bound

## 2013-06-01 NOTE — Progress Notes (Signed)
Fentanyl PCA d/c per MD order. Pump cleared prior to D/C, pt received at pump clear. Volume of 23ml waste/remaining in syringe. Waste volume down sink. Lenora Boys RN witness waste. Koren Bound

## 2013-06-02 ENCOUNTER — Inpatient Hospital Stay (HOSPITAL_COMMUNITY): Payer: Medicare Other

## 2013-06-02 MED ORDER — LACTULOSE 10 GM/15ML PO SOLN
20.0000 g | Freq: Once | ORAL | Status: AC
  Administered 2013-06-02: 20 g via ORAL
  Filled 2013-06-02: qty 30

## 2013-06-02 NOTE — Progress Notes (Signed)
Utilization review completed.  

## 2013-06-02 NOTE — Progress Notes (Addendum)
      [address]       [place] [address]             [phone]       4 Days Post-Op Procedure(s) (LRB): LEFT THOROCOTOMY WITH LEFT UPPER LOBE LOBECTOMY (Left)  Subjective: He has complaints of pain at chest tube sites. Passing flatus but no bowel movement yet.  Objective: Vital signs in last 24 hours: Temp:  [97.4 F (36.3 C)-98.7 F (37.1 C)] 98.2 F (36.8 C) (10/13 0736) Pulse Rate:  [81-96] 81 (10/13 0513) Cardiac Rhythm:  [-] Normal sinus rhythm (10/12 2000) Resp:  [15-27] 20 (10/13 0513) BP: (129-147)/(69-88) 129/75 mmHg (10/13 0513) SpO2:  [95 %-100 %] 96 % (10/13 0513)     Intake/Output from previous day: 10/12 0701 - 10/13 0700 In: 1390 [P.O.:800; I.V.:440; IV Piggyback:150] Out: 2185 [Urine:1925; Chest Tube:260]   Physical Exam:  Cardiovascular: RRR, no murmurs, gallops, or rubs. Pulmonary: Clear on right and diminished at left base; no rales, wheezes, or rhonchi. Abdomen: Soft, non tender, bowel sounds present. Extremities: N[place]ower extremity edema. Wounds: Clean and dry.  No erythema or signs of infection. Chest Tubes: to suction and no air leak  Lab Results: CBC: Recent Labs  06/01/13 0400  WBC 7.9  HGB 11.8*  HCT 33.9*  PLT 200   BMET:  Recent Labs  06/01/13 0400  NA 137  K 3.6  CL 101  CO2 28  GLUCOSE 111*  BUN 9  CREATININE 0.55  CALCIUM 8.9    PT/INR: No results found for this basename: LABPROT, INR,  in the last 72 hours ABG:  INR: Will add last result for INR, ABG once components are confirmed Will add last 4 CBG results once components are confirmed  Assessment/Plan:  1. CV - SR 2.  Pulmonary - Chest tubes with 200 cc of output last 24 hours. Chest tubes are on suction and there is no air leak.CXR this am shows stable left apical pneumothorax, elevation of left hemidiaphragm. Possible remove one tube today. 3.LOC constipation   ZIMMERMAN,DONIELLE MPA-C 06/02/2013,7:49 AM  Chart reviewed, patient  examined, agree with above. CXR looks ok. I don't see an air leak. Will remove one tube today. Path shows stage 3A adenocarcinoma. Results discussed with patient and wife. I will arrange oncology followup when I see him back in the office.

## 2013-06-03 ENCOUNTER — Inpatient Hospital Stay (HOSPITAL_COMMUNITY): Payer: Medicare Other

## 2013-06-03 NOTE — Progress Notes (Addendum)
      [address]       [place] [address]             [phone]       5 Days Post-Op Procedure(s) (LRB): LEFT THOROCOTOMY WITH LEFT UPPER LOBE LOBECTOMY (Left)  Subjective: He is feeling better this am.Had large bowel movement yesterday.  Objective: Vital signs in last 24 hours: Temp:  [97.5 F (36.4 C)-98.3 F (36.8 C)] 97.9 F (36.6 C) (10/14 0700) Pulse Rate:  [78-94] 78 (10/14 0420) Cardiac Rhythm:  [-] Normal sinus rhythm (10/14 0400) Resp:  [13-23] 13 (10/14 0420) BP: (122-138)/(76-93) 122/76 mmHg (10/14 0420) SpO2:  [93 %-100 %] 93 % (10/14 0420)     Intake/Output from previous day: 10/13 0701 - 10/14 0700 In: 720 [P.O.:720] Out: 1381 [Urine:1350; Stool:1; Chest Tube:30]   Physical Exam:  Cardiovascular: RRR Pulmonary: Clear on right and diminished at left base; no rales, wheezes, or rhonchi. Abdomen: Soft, non tender, bowel sounds present. Extremities: No lower extremity edema. Wounds: Clean and dry.  No erythema or signs of infection. Chest Tubes: to water seal and does not appear to have an air leak  Lab Results: CBC:  Recent Labs  06/01/13 0400  WBC 7.9  HGB 11.8*  HCT 33.9*  PLT 200   BMET:   Recent Labs  06/01/13 0400  NA 137  K 3.6  CL 101  CO2 28  GLUCOSE 111*  BUN 9  CREATININE 0.55  CALCIUM 8.9    PT/INR: No results found for this basename: LABPROT, INR,  in the last 72 hours ABG:  INR: Will add last result for INR, ABG once components are confirmed Will add last 4 CBG results once components are confirmed  Assessment/Plan:  1. CV - SR 2.  Pulmonary - One chest tube removed yesterday.Chest tube with 230 cc of output last 24 hours. According to my mark, he actually has had 120 cc of output.Chest tube is to water seal and there is no air leak.CXR this am shows left pneumothorax (actually a space as LLL cannot occupy entire pleural space on water seal), elevation of left hemidiaphragm.Hope to remove  remaining chest tube in am.Encourage incentive spirometer. Check CXR in am.Dr. Laneta Simmers discussed pathology with patient and his wife yesterday. 3.Remove central line   ZIMMERMAN,DONIELLE MPA-C 06/03/2013,7:50 AM   Chart reviewed, patient examined, agree with above. His cxr looks ok with apical space on water seal I don't see an air leak but some tidaling. Will keep the tube in today on water seal.

## 2013-06-04 ENCOUNTER — Inpatient Hospital Stay (HOSPITAL_COMMUNITY): Payer: Medicare Other

## 2013-06-04 MED ORDER — OXYCODONE-ACETAMINOPHEN 5-325 MG PO TABS: 1.0000 | ORAL_TABLET | ORAL | Status: DC | PRN

## 2013-06-04 MED ORDER — NICOTINE 21 MG/24HR TD PT24: 1.0000 | MEDICATED_PATCH | Freq: Every day | TRANSDERMAL | Status: DC

## 2013-06-04 NOTE — Progress Notes (Signed)
6 Days Post-Op Procedure(s) (LRB): LEFT THOROCOTOMY WITH LEFT UPPER LOBE LOBECTOMY (Left) Subjective: No complaints  Objective: Vital signs in last 24 hours: Temp:  [98 F (36.7 C)-99 F (37.2 C)] 98.3 F (36.8 C) (10/15 0800) Pulse Rate:  [83-95] 87 (10/15 0712) Cardiac Rhythm:  [-] Normal sinus rhythm (10/14 2320) Resp:  [14-23] 23 (10/15 0712) BP: (125-141)/(78-89) 128/82 mmHg (10/15 0712) SpO2:  [95 %-98 %] 97 % (10/15 0712)  Hemodynamic parameters for last 24 hours:    Intake/Output from previous day: 10/14 0701 - 10/15 0700 In: 600 [P.O.:600] Out: 220 [Urine:200; Chest Tube:20] Intake/Output this shift:    General appearance: alert and cooperative Heart: regular rate and rhythm, S1, S2 normal, no murmur, click, rub or gallop Lungs: clear to auscultation bilaterally Wound: incision ok small air leak from chest tube with coughing.  Lab Results: No results found for this basename: WBC, HGB, HCT, PLT,  in the last 72 hours BMET: No results found for this basename: NA, K, CL, CO2, GLUCOSE, BUN, CREATININE, CALCIUM,  in the last 72 hours  PT/INR: No results found for this basename: LABPROT, INR,  in the last 72 hours ABG    Component Value Date/Time   PHART 7.370 05/30/2013 0416   HCO3 27.6* 05/30/2013 0416   TCO2 29.1 05/30/2013 0416   ACIDBASEDEF 0.3 05/27/2013 1041   O2SAT 95.8 05/30/2013 0416   CBG (last 3)  No results found for this basename: GLUCAP,  in the last 72 hours  CXR: very small left apical space. Lungs clear. Assessment/Plan: S/P Procedure(s) (LRB): LEFT THOROCOTOMY WITH LEFT UPPER LOBE LOBECTOMY (Left) He is doing very well. There is a small air leak with coughing. Will continue chest tube to water seal. Transfer to 2W.   LOS: 6 days    Shalin Vonbargen K 06/04/2013

## 2013-06-04 NOTE — Progress Notes (Signed)
Pt tx 2 West per MD order, pt verbalized understanding of tx, pt tol well, pt VSS, pt IV out of date restart attempted, receiving RN/N, report given, all questions answered

## 2013-06-04 NOTE — Discharge Summary (Signed)
Physician Discharge Summary       [address] [place]       [place] [address]             [phone]    Patient ID: Jordan Mccullough MRN: [id number] DOB/AGE: 27-May-1941 71 y.o.  Admit date: 05/29/2013 Discharge date: 06/06/2013  Admission Diagnoses: 1. Left upper lobe nodule 2.History of tobacco abuse 3.History of hyperlipidemia 4.Hisotry of CAD (s/p MI) 5.History of AAA and right common iliac aneurysms  Discharge Diagnoses:  1. Left upper lobe nodule (Adenocarcinoma) 2.History of tobacco abuse 3.History of hyperlipidemia 4.Hisotry of CAD (s/p MI) 5.History of AAA and right common iliac aneurysms   Procedure (s):  1. Left muscle-sparing thoracotomy  2. Left upper lobectomy  3. Mediastinal lymph node dissection by Dr. Laneta Simmers on 05/29/2013.  Pathology: 1. Lymph node, biopsy, AP, left upper - METASTATIC ADENOCARCINOMA (1/1) 2. Lymph node, biopsy, Left hilar - METASTATIC ADENOCARCINOMA (1/1) 3. Lymph node, biopsy, Left hilar #2 - METASTATIC ADENOCARCINOMA (1/1) 4. Lung, resection (segmental or lobe), Left upper - ADENOCARCINOMA, 1.1 CM. - ADENOCARCINOMA FOCALLY INVOLVE VISCERAL PLEURA. - LYMPHOVASCULAR INVOLVEMENT BY TUMOR. 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 There were enlarged mediastinal and hilar lymph nodes with the largest being a right paratracheal node measuring 10 mm. A PET scan showed the nodule to have an SUV of 2.4 suspicious for bronchogenic carcinoma. There is a 8 mm AP window lymph node with a SUV of 5.0. There is focal hypermetabolism in the left hilum with an SUV of 4.3 that is concerning for lymph node mets. The patient reports that he is  asymptomatic. Dr. Laneta Simmers discussed with the patient the need for a left upper lobectomy. Potential risks, benefits, and complications were discussed with the patient and he agreed to proceed. He underwent a muscle sparing left upper lobectomy with lymph node dissection on 05/29/2013.  Brief Hospital Course:  He remained afebrile and hemodynamically stable. His a line and foley were removed early in his post operative course. He had daily chest x rays obtained. His chest tube output gradually declined and chest x rays remained stable. He was transferred from the ICU to 3300 for further convalescence on 06/01/2013.One chest tube was removed on 10/13. The remaining chest tube was placed to water seal. There was an air leak. As a result, his chest tube remained to water seal for a few days. CXR this am showed trace left apical pneumothorax (decreased from previously) and elevation of left hemidiaphragm. It was then decided to place the chest tube to a mini express. He has been tolerating a diet and has had a bowel movement. He has been ambulating on room air. He is felt surgically stable for discharge today.    Latest Vital Signs: Blood pressure 121/75, pulse 82, temperature 98 F (36.7 C), temperature source Oral, resp. rate 18, height 5\' 5"  (1.651 m), weight 65.2 kg (143 lb 11.8 oz), SpO2 96.00%.  Physical Exam: Cardiovascular: RRR  Pulmonary: Clear on right and diminished at left base; no rales, wheezes, or rhonchi.  Abdomen: Soft, non tender, bowel sounds present.  Extremities: No lower extremity edema.  Wounds: Clean and  dry. No erythema or signs of infection.  Chest Tubes: to water seal and appears to have an air leak   Discharge Condition:Stable  Recent laboratory studies:  Lab Results  Component Value Date   WBC 7.9 06/01/2013   HGB 11.8* 06/01/2013   HCT 33.9* 06/01/2013   MCV 89.9 06/01/2013   PLT 200 06/01/2013   Lab Results  Component Value Date   NA 137 06/01/2013   K 3.6  06/01/2013   CL 101 06/01/2013   CO2 28 06/01/2013   CREATININE 0.55 06/01/2013   GLUCOSE 111* 06/01/2013      Diagnostic Studies: Mr Laqueta Jean FA Contrast  May 31, 2013   *RADIOLOGY REPORT*  Clinical Data: Lung cancer, staging.  MRI HEAD WITHOUT AND WITH CONTRAST  Technique:  Multiplanar, multiecho pulse sequences of the brain and surrounding structures were obtained according to standard protocol without and with intravenous contrast  Contrast: 12mL MULTIHANCE GADOBENATE DIMEGLUMINE 529 MG/ML IV SOLN  Comparison: PET scan 04/17/2013.  Findings: There is no evidence for acute infarction, intracranial hemorrhage, mass lesion, hydrocephalus, or extra-axial fluid.  Mild atrophy.  Mild chronic microvascular ischemic change.  Flow voids are maintained.  No midline abnormality.  No osseous lesions. Negative orbits, sinuses, and mastoids.  Post infusion, no abnormal enhancement of the brain or meninges. No intracranial metastatic deposits are evident.  Major venous sinuses are patent.  Extracranial soft tissues unremarkable.  IMPRESSION: Chronic changes as described.  No intracranial metastatic lesions are evident.   Original Report Authenticated By: Davonna Belling, M.D.   Dg Chest Port 1 View  06/06/2013  FINDINGS:  Post left upper lobectomy per history provided. Left-sided chest  tube remains in place. Slight decrease in size of left-sided  pneumothorax with a very small left-sided pneumothorax remaining.  Minimal amount of left-sided subcutaneous emphysema.  Elevated left hemidiaphragm. Slightly tortuous aorta. Cardiomegaly.  IMPRESSION:  Slight decrease in size of left-sided pneumothorax with very small  left-sided pneumothorax remaining. Otherwise no significant change.  Please see above.  Electronically Signed  By: Bridgett Larsson M.D.           Future Appointments Provider Department Dept Phone   06/11/2013 11:30 AM Alleen Borne, MD [facility]  [phone]   10/13/2013 8:30 AM [facility] [place] [facility] [address] [phone]   Eat a light meal the night before the exam but please avoid gaseous foods.   Nothing to eat or drink for at least 8 hours prior to the exam. No gum chewing or smoking the morning of the exam. Please take your morning medications with small sips of water, especially blood pressure medication. If you have several vascular lab exams and will see physician, please bring a snack with you.   10/13/2013 9:00 AM Nada Libman, MD [facility] [phone]      Discharge Medications:   Medication List         aspirin EC 81 MG tablet  Take 81 mg by mouth daily.     multivitamin tablet  Take 1 tablet by mouth every evening.     nicotine 21 mg/24hr patch  Commonly known as:  NICODERM CQ - dosed in mg/24 hours  Place 1 patch onto the skin daily.     simvastatin 40 MG tablet  Commonly known as:  ZOCOR  Take 40 mg by mouth every evening.     traMADol 50 MG tablet  Commonly known as:  ULTRAM  Take 1 tablet (  50 mg total) by mouth every 6 (six) hours as needed for pain.        Follow Up Appointments: Follow-up Information   Follow up with Alleen Borne, MD. (PA/LAT CXR to be taken (at [facility] which is in the same building as Dr. Sharee Pimple office) on 06/11/2013 at 11:00 am;Appointment with Dr. Laneta Simmers is on 06/11/2013 at 12:00 om)    Specialty:  Cardiothoracic Surgery   Contact information:   [address] [place] [address] [phone]       Signed: Doree Fudge MPA-C 06/06/2013, 12:04 PM

## 2013-06-05 ENCOUNTER — Inpatient Hospital Stay (HOSPITAL_COMMUNITY): Payer: Medicare Other

## 2013-06-05 NOTE — Progress Notes (Signed)
      [address]       [place] [address]             [phone]       7 Days Post-Op Procedure(s) (LRB): LEFT THOROCOTOMY WITH LEFT UPPER LOBE LOBECTOMY (Left)  Subjective: He has no complaints. He slept very well last evening  Objective: Vital signs in last 24 hours: Temp:  [98.3 F (36.8 C)-98.7 F (37.1 C)] 98.4 F (36.9 C) (10/16 0448) Pulse Rate:  [84-98] 85 (10/16 0448) Cardiac Rhythm:  [-] Normal sinus rhythm (10/15 2131) Resp:  [16-18] 18 (10/16 0448) BP: (121-135)/(79-88) 121/79 mmHg (10/16 0448) SpO2:  [96 %-97 %] 96 % (10/16 0448)     Intake/Output from previous day: 10/15 0701 - 10/16 0700 In: 120 [P.O.:120] Out: 600 [Urine:600]   Physical Exam:  Cardiovascular: RRR Pulmonary: Clear on right and diminished at left base; no rales, wheezes, or rhonchi. Abdomen: Soft, non tender, bowel sounds present. Extremities: No lower extremity edema. Wounds: Clean and dry.  No erythema or signs of infection. Chest Tubes: to water seal and does not appear to have an air leak but there is some tidling  Lab Results: CBC: No results found for this basename: WBC, HGB, HCT, PLT,  in the last 72 hours BMET:  No results found for this basename: NA, K, CL, CO2, GLUCOSE, BUN, CREATININE, CALCIUM,  in the last 72 hours  PT/INR: No results found for this basename: LABPROT, INR,  in the last 72 hours ABG:  INR: Will add last result for INR, ABG once components are confirmed Will add last 4 CBG results once components are confirmed  Assessment/Plan:  1. CV - SR 2.  Pulmonary - Remaining chest tube with scant output last 24 hours.Chest tube is to water seal and there is some tidling but appears not to have an leak.CXR this am shows trace left pneumothorax (actually a space as LLL cannot occupy entire pleural space on water seal), elevation of left hemidiaphragm.Hope to remove remaining chest tube today.Encourage incentive spirometer.      Aiyonna Lucado MPA-C 06/05/2013,7:58 AM

## 2013-06-05 NOTE — Progress Notes (Signed)
06/05/2013 1450 Received telephone call from Doree Fudge [facility] instructing RN to unclamp left CT. Orders enacted. Pt. Updated on plan of care. Will continue to monitor patient.  Cavon Nicolls, Blanchard Kelch

## 2013-06-06 ENCOUNTER — Inpatient Hospital Stay (HOSPITAL_COMMUNITY): Payer: Medicare Other

## 2013-06-06 MED ORDER — TRAMADOL HCL 50 MG PO TABS: 50.0000 mg | ORAL_TABLET | Freq: Four times a day (QID) | ORAL | Status: DC | PRN

## 2013-06-06 NOTE — Progress Notes (Addendum)
      [address]       [place] [address]             [phone]       8 Days Post-Op Procedure(s) (LRB): LEFT THOROCOTOMY WITH LEFT UPPER LOBE LOBECTOMY (Left)  Subjective: He feels well  Objective: Vital signs in last 24 hours: Temp:  [98 F (36.7 C)-98.6 F (37 C)] 98 F (36.7 C) (10/17 0418) Pulse Rate:  [82-89] 82 (10/17 0418) Cardiac Rhythm:  [-] Sinus tachycardia (10/16 2015) Resp:  [18] 18 (10/17 0418) BP: (121-130)/(75-81) 121/75 mmHg (10/17 0418) SpO2:  [96 %-98 %] 96 % (10/17 0418)     Intake/Output from previous day: 10/16 0701 - 10/17 0700 In: 1560 [P.O.:1560] Out: 20 [Chest Tube:20]   Physical Exam:  Cardiovascular: RRR Pulmonary: Clear on right and diminished at left base; no rales, wheezes, or rhonchi. Abdomen: Soft, non tender, bowel sounds present. Extremities: No lower extremity edema. Wounds: Clean and dry.  No erythema or signs of infection. Chest Tubes: to water seal and does not appear to have an air leak but there is some tidling  Lab Results: CBC: No results found for this basename: WBC, HGB, HCT, PLT,  in the last 72 hours BMET:  No results found for this basename: NA, K, CL, CO2, GLUCOSE, BUN, CREATININE, CALCIUM,  in the last 72 hours  PT/INR: No results found for this basename: LABPROT, INR,  in the last 72 hours ABG:  INR: Will add last result for INR, ABG once components are confirmed Will add last 4 CBG results once components are confirmed  Assessment/Plan:  1. CV - SR 2.  Pulmonary - Remaining chest tube with scant output last 24 hours.Chest tube is to water seal and there is some tidling-questionable air leak.CXR this am shows trace left pneumothorax (decreased from yesterday afternoon), elevation of left hemidiaphragm.Encourage incentive spirometer.     ZIMMERMAN,DONIELLE MPA-C 06/06/2013,7:53 AM    Chart reviewed, patient examined, agree with above. There is still a small air leak with coughing.  The cxr looks good. Will switch to mini-express and plan to send home later today.

## 2013-06-06 NOTE — Progress Notes (Signed)
Swapped out pt's [place] drain for mini express per MD order. Clamped tubing prior to swapping devices, unclamped once devices attached. Pt tolerated very well. Taped connection of tubes to ensure no leakage. Pt also has extra mini express to take home with him. Case management is to set up [facility] RN.

## 2013-06-06 NOTE — Progress Notes (Signed)
Discharge instructions given to pt along with new prescriptions. Pt verbalized his understanding of discharge instructions and is very comfortable handling his mini express. Pt will be discharged with his wife and transported with a wheelchair to the car.

## 2013-06-10 ENCOUNTER — Other Ambulatory Visit: Payer: Self-pay | Admitting: *Deleted

## 2013-06-10 DIAGNOSIS — R911 Solitary pulmonary nodule: Secondary | ICD-10-CM

## 2013-06-11 ENCOUNTER — Ambulatory Visit
Admission: RE | Admit: 2013-06-11 | Discharge: 2013-06-11 | Disposition: A | Discharge status: Home / Self Care | Payer: Medicare Other | Source: Ambulatory Visit | Attending: Surgery | Admitting: Surgery

## 2013-06-11 ENCOUNTER — Ambulatory Visit (INDEPENDENT_AMBULATORY_CARE_PROVIDER_SITE_OTHER): Payer: Self-pay | Admitting: Surgery

## 2013-06-11 ENCOUNTER — Encounter: Payer: Self-pay | Admitting: Surgery

## 2013-06-11 DIAGNOSIS — Z9889 Other specified postprocedural states: Secondary | ICD-10-CM

## 2013-06-11 DIAGNOSIS — J9382 Other air leak: Secondary | ICD-10-CM

## 2013-06-11 DIAGNOSIS — C3410 Malignant neoplasm of upper lobe, unspecified bronchus or lung: Secondary | ICD-10-CM

## 2013-06-11 DIAGNOSIS — IMO0002 Reserved for concepts with insufficient information to code with codable children: Secondary | ICD-10-CM

## 2013-06-11 DIAGNOSIS — R911 Solitary pulmonary nodule: Secondary | ICD-10-CM

## 2013-06-11 DIAGNOSIS — Z902 Acquired absence of lung [part of]: Secondary | ICD-10-CM

## 2013-06-11 NOTE — Progress Notes (Signed)
       [address]       [place] [address]             [phone]        HPI:  Patient returns for routine postoperative follow-up having undergone left upper lobectomy and mediastinal lymph node resection on 05/29/2013. The final pathology showed T2a, N2, M0 (stage IIIA) adenocarcinoma. The patient's early postoperative recovery while in the hospital was notable for a prolonged postoperative air leak. He was sent home last week with a mini-express connected to his chest tube. Since hospital discharge the patient reports that he has felt well. He is walking daily without chest pain or shortness of breath. He is doing 2200 on his incentive spirometer. He has continued to abstain from smoking.   Current Outpatient Prescriptions  Medication Sig Dispense Refill  . aspirin EC 81 MG tablet Take 81 mg by mouth daily.      . Multiple Vitamin (MULTIVITAMIN) tablet Take 1 tablet by mouth every evening.       . nicotine (NICODERM CQ - DOSED IN MG/24 HOURS) 21 mg/24hr patch Place 1 patch onto the skin daily.  28 patch  1  . simvastatin (ZOCOR) 40 MG tablet Take 40 mg by mouth every evening.      . traMADol (ULTRAM) 50 MG tablet Take 1 tablet (50 mg total) by mouth every 6 (six) hours as needed for pain.  40 tablet  0   No current facility-administered medications for this visit.    Physical Exam: BP 140/93  Pulse 88  Resp 16  Ht 5\' 5"  (1.651 m)  Wt 143 lb (64.864 kg)  BMI 23.8 kg/m2  SpO2 99% He looks well. Cardiac exam shows a regular rate and rhythm with normal heart sounds. Lung exam is clear. The left thoracotomy incision is healing well. There is no leak from the chest tube. The tube was removed without difficulty. The other chest tube site is healing well. The suture was removed.  Diagnostic Tests:  CLINICAL DATA: Followup left chest surgery  EXAM:  CHEST 2 VIEW  COMPARISON: 06/06/2013  FINDINGS:  Left chest tube is again identified. No significant  pneumothorax is  seen. Postsurgical changes are noted in the left hilum with some  volume loss. The right lung is clear. The cardiac shadow is within  normal limits. No acute bony abnormality is seen.  IMPRESSION:  No evidence of pneumothorax. No acute abnormality is noted.  Electronically Signed  By: Alcide Clever M.D.  On: 06/11/2013 11:14   Impression:  He is doing well following left upper lobectomy. His air leak has stopped and the chest tube was removed. I told him he could return to driving when he feels comfortable with that. He is no longer using pain medication. I encouraged him to continue refraining from smoking.  Plan:  I will plan to see him back in one week for followup. I will schedule an appointment for him to see medical oncology and radiation oncology in [facility] clinic.

## 2013-06-16 ENCOUNTER — Telehealth: Payer: Self-pay | Admitting: *Deleted

## 2013-06-16 ENCOUNTER — Encounter: Payer: Self-pay | Admitting: *Deleted

## 2013-06-16 NOTE — Progress Notes (Signed)
Received fax today from [facility].  It states results will be completed 07/01/13.  Information will be scanned in to EMR

## 2013-06-16 NOTE — Telephone Encounter (Signed)
Called left vm message regarding appt for mtoc 06/19/13 at 2:45

## 2013-06-16 NOTE — Telephone Encounter (Signed)
Jordan Mccullough had a mini express removed at the office last week.  His wife called to say that the chest tube site has been leaking ever since and more today.  There is also some swelling above the site but no redness and he has been afebrile. I told her to change the dressings as necessary and he would be evaluated on Wednesday at his appointment.  I reassured her that this was normal.

## 2013-06-17 ENCOUNTER — Telehealth: Payer: Self-pay | Admitting: Internal Medicine

## 2013-06-17 NOTE — Telephone Encounter (Signed)
C/D 06/17/13 for appt.

## 2013-06-18 ENCOUNTER — Ambulatory Visit (INDEPENDENT_AMBULATORY_CARE_PROVIDER_SITE_OTHER): Payer: Self-pay | Admitting: Surgery

## 2013-06-18 ENCOUNTER — Ambulatory Visit: Payer: Medicare Other | Admitting: Surgery

## 2013-06-18 ENCOUNTER — Encounter: Payer: Self-pay | Admitting: Surgery

## 2013-06-18 DIAGNOSIS — Z9889 Other specified postprocedural states: Secondary | ICD-10-CM

## 2013-06-18 DIAGNOSIS — Z902 Acquired absence of lung [part of]: Secondary | ICD-10-CM

## 2013-06-18 DIAGNOSIS — C3410 Malignant neoplasm of upper lobe, unspecified bronchus or lung: Secondary | ICD-10-CM

## 2013-06-18 NOTE — Progress Notes (Signed)
      [address]       [place] [address]             [phone]        HPI:  Patient returns for routine postoperative follow-up having undergone left upper lobectomy and mediastinal lymph node resection on 05/29/2013. The final pathology showed T2a, N2, M0 (stage IIIA) adenocarcinoma.  The patient's early postoperative recovery while in the hospital was notable for a prolonged postoperative air leak. He was sent home last week with a mini-express connected to his chest tube.  Since hospital discharge the patient reports that he has felt well. He is walking daily without chest pain or shortness of breath. He is doing 2200 on his incentive spirometer. He has continued to abstain from smoking. He has had some serous drainage from the chest tube site.   Current Outpatient Prescriptions  Medication Sig Dispense Refill  . aspirin EC 81 MG tablet Take 81 mg by mouth daily.      Marland Kitchen ibuprofen (ADVIL,MOTRIN) 200 MG tablet Take 200 mg by mouth every 6 (six) hours as needed for pain.      . Multiple Vitamin (MULTIVITAMIN) tablet Take 1 tablet by mouth every evening.       . nicotine (NICODERM CQ - DOSED IN MG/24 HOURS) 21 mg/24hr patch Place 1 patch onto the skin daily.  28 patch  1  . simvastatin (ZOCOR) 40 MG tablet Take 40 mg by mouth every evening.       No current facility-administered medications for this visit.     Physical Exam:  BP 121/85  Pulse 88  Resp 18  Ht 5\' 5"  (1.651 m)  Wt 143 lb (64.864 kg)  BMI 23.8 kg/m2  SpO2 98% He looks well.  Cardiac exam shows a regular rate and rhythm with normal heart sounds.  Lung exam is clear.  The left thoracotomy incision is healing well. The chest tube site is open and draining a small amount of serous fluid. There is no erythema.    Impression:  He continues to do well. There is some serous drainage from the old chest tube site. Since the tube was in for a few weeks the incision did not seal up after the tube  was removed. This should stop on its own. I asked him to keep a dressing over the site until it stops draining.  Plan:  He has an appt in [facility] clinic tomorrow to see Dr. Arbutus Ped and the radiation oncologist. He will return to see me if the chest tube site does not heal up as expected.

## 2013-06-19 ENCOUNTER — Ambulatory Visit (HOSPITAL_BASED_OUTPATIENT_CLINIC_OR_DEPARTMENT_OTHER): Payer: Medicare Other | Admitting: Internal Medicine

## 2013-06-19 ENCOUNTER — Telehealth: Payer: Self-pay | Admitting: Internal Medicine

## 2013-06-19 ENCOUNTER — Encounter: Payer: Self-pay | Admitting: Internal Medicine

## 2013-06-19 ENCOUNTER — Ambulatory Visit: Payer: Medicare Other | Attending: Internal Medicine | Admitting: Physical Therapy

## 2013-06-19 ENCOUNTER — Ambulatory Visit
Admission: RE | Admit: 2013-06-19 | Discharge: 2013-06-19 | Disposition: A | Discharge status: Home / Self Care | Payer: Medicare Other | Source: Ambulatory Visit | Attending: Radiation Oncology | Admitting: Radiation Oncology

## 2013-06-19 VITALS — BP 144/99 | HR 92 | Temp 97.8°F | Resp 19 | Ht 65.0 in | Wt 142.6 lb

## 2013-06-19 DIAGNOSIS — C3410 Malignant neoplasm of upper lobe, unspecified bronchus or lung: Secondary | ICD-10-CM | POA: Insufficient documentation

## 2013-06-19 DIAGNOSIS — Z87891 Personal history of nicotine dependence: Secondary | ICD-10-CM

## 2013-06-19 DIAGNOSIS — M4000 Postural kyphosis, site unspecified: Secondary | ICD-10-CM | POA: Insufficient documentation

## 2013-06-19 DIAGNOSIS — R293 Abnormal posture: Secondary | ICD-10-CM | POA: Insufficient documentation

## 2013-06-19 DIAGNOSIS — IMO0001 Reserved for inherently not codable concepts without codable children: Secondary | ICD-10-CM | POA: Insufficient documentation

## 2013-06-19 DIAGNOSIS — C3492 Malignant neoplasm of unspecified part of left bronchus or lung: Secondary | ICD-10-CM

## 2013-06-19 DIAGNOSIS — C3412 Malignant neoplasm of upper lobe, left bronchus or lung: Secondary | ICD-10-CM | POA: Insufficient documentation

## 2013-06-19 MED ORDER — PROCHLORPERAZINE MALEATE 10 MG PO TABS: 10.0000 mg | ORAL_TABLET | Freq: Four times a day (QID) | ORAL | Status: DC | PRN

## 2013-06-19 MED ORDER — DEXAMETHASONE 4 MG PO TABS: ORAL_TABLET | ORAL | Status: DC

## 2013-06-19 MED ORDER — FOLIC ACID 1 MG PO TABS: 1.0000 mg | ORAL_TABLET | Freq: Every day | ORAL | Status: DC

## 2013-06-19 MED ORDER — CYANOCOBALAMIN 1000 MCG/ML IJ SOLN: 1000.0000 ug | Freq: Once | INTRAMUSCULAR | Status: DC

## 2013-06-19 NOTE — Telephone Encounter (Signed)
Gave pt appt for lab,md and chemo class, for November , emailed michele regarding chemo for November and December

## 2013-06-20 ENCOUNTER — Encounter: Payer: Self-pay | Admitting: *Deleted

## 2013-06-20 ENCOUNTER — Telehealth: Payer: Self-pay | Admitting: *Deleted

## 2013-06-20 NOTE — Telephone Encounter (Signed)
Per staff message and POF I have scheduled appts. I have tried to schedule appt for 11/18, but MD appt needs to be moved. Scheduler advised  JMW

## 2013-06-20 NOTE — Progress Notes (Signed)
   Thoracic Treatment Summary Name:Jordan Mccullough LIST Date:06/20/2013 (Seen at thoracic clinic 06/19/13) DOB:Oct 22, 1940 Your Medical Team Medical Oncologist: Dr. Arbutus Ped Radiation Oncologist: Dr. Mitzi Hansen Surgeon: Dr. Laneta Simmers Type and Stage of Lung Cancer Non-Small Cell Carcinoma: Adenocarcinoma Clinical Stage:  III   Clinical stage is based on radiology exams.  Pathological stage will be determined after surgery.  Staging is based on the size of the tumor, involvement of lymph nodes or not, and whether or not the cancer center has spread. Recommendations Recommendations: Chemotherapy and radiation   These recommendations are based on information available as of today's consult.  This is subject to change depending further testing or exams. Next Steps Next Step: 1. Chemotherapy class and B12 injection Nov 11 2. First Chemotherapy treatment Nov 18 3. Radiation Oncology will call and set up appointments [phone]  Barriers to Care What do you perceive as a potential barrier that may prevent you from receiving your treatment plan? No barriers perceived at this time Resources: [employer] [phone] www.cancer.org CancerCare www.cancercare.org  Questions Willette Pa, RN BSN Thoracic Oncology Nurse Navigator at [phone]  Jordan Mccullough is a nurse navigator that is available to assist you through your cancer journey.  She can answer your questions and/or provide resources regarding your treatment plan, emotional support, or financial concerns.

## 2013-06-20 NOTE — Progress Notes (Signed)
Faxed [facility] requisition form to pathology dept

## 2013-06-21 NOTE — Patient Instructions (Signed)
You are recently diagnosed with a stage IIIa non-small cell lung cancer. We discussed treatment options including adjuvant chemotherapy with cisplatin and Alimta. First cycle in 3 weeks.

## 2013-06-21 NOTE — Progress Notes (Signed)
[facility] [phone]) [phone]   [phone] Multidisciplinary thoracic oncology clinic (MTOC)  CONSULT NOTE  REFERRING PHYSICIAN: Dr. Bill Salinas  REASON FOR CONSULTATION:  72 years old white male recently diagnosed with lung cancer.  HPI Jordan Mccullough is a 72 y.o. male with past medical history significant for dyslipidemia, history of coronary artery disease status post PTCA in 1992, abdominal aortic aneurysm, and long history of smoking. The patient was seen by his vascular surgeon for routine annual evaluation for the abdominal aortic aneurysm. CT angiogram of the chest, abdomen and pelvis was performed on 04/14/2013. It showed a spiculated 1.1 x 1.2 CM peripheral left upper lobe pulmonary nodule concerning for primary bronchogenic carcinoma. The patient was referred to Dr. Laneta Simmers and PET scan was performed on 04/17/2013. It showed a spiculated 1.1 CM nodule in the lateral left upper lobe with maximum SUV of 2.4 suspicious for primary bronchogenic neoplasm. There was suspected left hilar and AP window nodal metastasis. There was mild hypermetabolism/stranding within a right inguinal hernia of uncertain in significance but likely unrelated to malignancy. MRI of the brain was performed on 05/16/2013 and showed chronic changes with no intracranial metastatic lesions.  On 05/01/2013 the patient underwent flexible video bronchoscopy with mediastinoscopy and lymph node biopsies under the care of Dr. Laneta Simmers. There was no evidence for malignancy in the biopsied lymph nodes from level 4R, 4L, 7, 10L and 10R. On 05/29/2013 the patient underwent left muscle-sparing thoracotomy, left upper lobectomy and mediastinal lymph node dissection under the care of Dr. Laneta Simmers. The final pathology (Accession: [id number]) showed a 1.1 CM adenocarcinoma focally involving the visceral pleura with lymphovascular invasion. There was metastatic adenocarcinoma in 3 left hilar lymphadenopathy as  well as left AP window lymphadenopathy.  The patient is recovering well from her surgery and Dr. Laneta Simmers kindly referred him to me today for further evaluation and recommendation regarding adjuvant treatment. When seen today the patient is feeling good with no specific complaints except for mild hoarseness from intubation during the surgery but no significant chest pain, shortness of breath. He continues to have mild cough productive of clear mucus. The patient has no hemoptysis. He denied having any significant weight loss or night sweats. The patient denied having any headache or visual changes. He has no nausea or vomiting, no fever or chills. His family history significant for father with prostate cancer and a sister with lung cancer at age 56 and she was nonsmoker.  The patient is married and has 2 children. He was accompanied by his wife Jordan Mccullough and his daughter Jordan Mccullough. He also has a son named Corporate treasurer. 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 He has a history of smoking more than one pack per day for a total of around 25 years he quit between 1992 to 2002 and then resumed smoking again and quit recently. He drinks 3-4 beers every day and no history of drug abuse. HPI  Past Medical History  Diagnosis Date  . Hyperlipidemia   . Diverticulosis   . Coronary artery disease     MI 1992, S/P  PTCA; negative stress test in November 2011 with no ischemia.   . Peripheral vascular disease 1/14    4.8x4.6 cm infrarenal abdominal aortic fusiform aneurysm, 1.5 cm right common iliac artery aneurysm  . AAA (abdominal aortic aneurysm)   . Hearing loss     Bilateral   . Myocardial infarction 1992  dr Clifton James   cardiac  md    Past Surgical History  Procedure Laterality Date  . Ptca  1992  . Inguinal heriiorrhaphy bilaterally    . Colonoscopy w/ polypectomy  2003     negative 2010,due 2020; Dr Juanda Chance  . Pilonidal cyst excision    . Rotator cuff repair       Bilateral  . Cystoscopy/retrograde/ureteroscopy Bilateral 10/09/2012    Procedure: BILATERAL RETROGRADE bladder and urethral BIOPSY ;  Surgeon: Milford Cage, MD;  Location: [facility];  Service: Urology;  Laterality: Bilateral;  BILATERAL RETROGRADE  AND bladder and urethral BIOPSY    . Prostate biopsy N/A 10/09/2012    Procedure: PROSTATIC URETHRAL BIOPSY;  Surgeon: Milford Cage, MD;  Location: [facility];  Service: Urology;  Laterality: N/A;  PROSTATIC URETHRAL BIOPSY    . Hernia repair Bilateral     Inguinal  . Mediastinoscopy N/A 05/01/2013    Procedure: MEDIASTINOSCOPY;  Surgeon: Alleen Borne, MD;  Location: [facility] OR;  Service: Thoracic;  Laterality: N/A;  . Video bronchoscopy N/A 05/01/2013    Procedure: VIDEO BRONCHOSCOPY;  Surgeon: Alleen Borne, MD;  Location: [facility] OR;  Service: Thoracic;  Laterality: N/A;  . Coronary angioplasty      no stents  . Thorocotomy with lobectomy Left 05/29/2013    Procedure: LEFT THOROCOTOMY WITH LEFT UPPER LOBE LOBECTOMY;  Surgeon: Alleen Borne, MD;  Location: [facility] OR;  Service: Thoracic;  Laterality: Left;    Family History  Problem Relation Age of Onset  . Hypertension Mother   . Prostate cancer Father     Prostate cancer  . Cancer Father   . Lung cancer Sister     NON SMOKER  . Cancer Sister   . Hyperlipidemia Sister   . Diabetes Neg Hx   . Stroke Neg Hx   . Hyperlipidemia Brother   . Heart attack Brother   . Hypertension Brother     Social History History  Substance Use Topics  . Smoking status: Former Smoker -- 2.00 packs/day for 24 years    Types: Cigarettes, Cigars  . Smokeless tobacco: Former Neurosurgeon    Quit date: 05/21/2013     Comment: QUIT 15 YEARS AGO  . Alcohol Use: 1.8 oz/week    3 Glasses of wine per week     Comment: daily    No Known Allergies  Current Outpatient Prescriptions  Medication Sig Dispense Refill  . aspirin EC 81 MG tablet Take 81 mg by mouth daily.      Marland Kitchen ibuprofen (ADVIL,MOTRIN) 200 MG  tablet Take 200 mg by mouth every 6 (six) hours as needed for pain.      . Multiple Vitamin (MULTIVITAMIN) tablet Take 1 tablet by mouth every evening.       . nicotine (NICODERM CQ - DOSED IN MG/24 HOURS) 21 mg/24hr patch Place 1 patch onto the skin daily.  28 patch  1  . simvastatin (ZOCOR) 40 MG tablet Take 40 mg by mouth every evening.      Marland Kitchen dexamethasone (DECADRON) 4 MG tablet 4 mg by mouth 3 times a day, the day before, day of and day after the chemotherapy.  40 tablet  0  . folic acid (FOLVITE) 1 MG tablet Take 1 tablet (1 mg total) by mouth daily.  30 tablet  2  . prochlorperazine (COMPAZINE) 10 MG tablet Take 1 tablet (10 mg total) by mouth every 6 (six) hours as needed.  60 tablet  0  No current facility-administered medications for this visit.    Review of Systems  Constitutional: negative Eyes: negative Ears, nose, mouth, throat, and face: negative Respiratory: positive for cough and sputum Cardiovascular: negative Gastrointestinal: negative Genitourinary:negative Integument/breast: negative Hematologic/lymphatic: negative Musculoskeletal:negative Neurological: negative Behavioral/Psych: negative Endocrine: negative Allergic/Immunologic: negative  Physical Exam  ZOX:WRUEA, healthy, no distress, well nourished and well developed SKIN: skin color, texture, turgor are normal, no rashes or significant lesions HEAD: Normocephalic, No masses, lesions, tenderness or abnormalities EYES: normal, PERRLA EARS: External ears normal OROPHARYNX:no exudate and no erythema  NECK: supple, no adenopathy, no bruits LYMPH:  no palpable lymphadenopathy, no hepatosplenomegaly LUNGS: and palpation, clear to auscultation and percussion HEART: regular rate & rhythm, no murmurs and no gallops ABDOMEN:abdomen soft, non-tender, normal bowel sounds and no masses or organomegaly BACK: Back symmetric, no curvature., No CVA tenderness EXTREMITIES:no joint deformities, effusion, or  inflammation, no edema, no skin discoloration  NEURO: alert & oriented x 3 with fluent speech, no focal motor/sensory deficits  PERFORMANCE STATUS: ECOG 1  LABORATORY DATA: Lab Results  Component Value Date   WBC 7.9 06/01/2013   HGB 11.8* 06/01/2013   HCT 33.9* 06/01/2013   MCV 89.9 06/01/2013   PLT 200 06/01/2013      Chemistry      Component Value Date/Time   NA 137 06/01/2013 0400   K 3.6 06/01/2013 0400   CL 101 06/01/2013 0400   CO2 28 06/01/2013 0400   BUN 9 06/01/2013 0400   CREATININE 0.55 06/01/2013 0400   CREATININE 0.76 04/11/2013 0928      Component Value Date/Time   CALCIUM 8.9 06/01/2013 0400   ALKPHOS 49 05/27/2013 1041   AST 28 05/27/2013 1041   ALT 30 05/27/2013 1041   BILITOT 0.4 05/27/2013 1041       RADIOGRAPHIC STUDIES: Dg Chest 2 View  06/11/2013   CLINICAL DATA:  Followup left chest surgery  EXAM: CHEST  2 VIEW  COMPARISON:  06/06/2013  FINDINGS: Left chest tube is again identified. No significant pneumothorax is seen. Postsurgical changes are noted in the left hilum with some volume loss. The right lung is clear. The cardiac shadow is within normal limits. No acute bony abnormality is seen.  IMPRESSION: No evidence of pneumothorax.  No acute abnormality is noted.   Electronically Signed   By: Alcide Clever M.D.   On: 06/11/2013 11:14   Dg Chest 2 View  06/05/2013   CLINICAL DATA:  Clamping of left chest tube  EXAM: CHEST  2 VIEW  COMPARISON:  Portable chest x-ray of 06/05/2013  FINDINGS: The left apical pneumothorax has increased slightly in volume after clamping of the left chest tube. The left hemidiaphragm remains elevated. The right lung is clear. Cardiomegaly is stable.  IMPRESSION: Increase in volume of the left pneumothorax after clamping of the left chest tube.   Electronically Signed   By: Dwyane Dee M.D.   On: 06/05/2013 14:37   Dg Chest 2 View  05/27/2013   CLINICAL DATA:  All left lung surgery, carcinoma in  EXAM: CHEST  2 VIEW   COMPARISON:  05/01/2013  FINDINGS: Heart size and mediastinal contours normal. Lungs clear except for a 1 cm lateral left upper lobe opacity as previously described.  IMPRESSION: No active cardiopulmonary disease.Known left lung mass.   Electronically Signed   By: Esperanza Heir M.D.   On: 05/27/2013 11:02   Dg Chest Port 1 View  06/06/2013   CLINICAL DATA:  Followup pneumothorax.  EXAM: PORTABLE  CHEST - 1 VIEW  COMPARISON:  06/05/2013.  FINDINGS: Post left upper lobectomy per history provided. Left-sided chest tube remains in place. Slight decrease in size of left-sided pneumothorax with a very small left-sided pneumothorax remaining. Minimal amount of left-sided subcutaneous emphysema.  Elevated left hemidiaphragm.  Slightly tortuous aorta. Cardiomegaly.  IMPRESSION: Slight decrease in size of left-sided pneumothorax with very small left-sided pneumothorax remaining. Otherwise no significant change. Please see above.   Electronically Signed   By: Bridgett Larsson M.D.   On: 06/06/2013 08:04   Dg Chest Port 1 View  06/05/2013   CLINICAL DATA:  Chest tube in place.  EXAM: PORTABLE CHEST - 1 VIEW  COMPARISON:  06/02/2013.  FINDINGS: Left-sided chest tube is in place. Small left lateral and apical pneumothorax minimally changed from the prior exam.  Elevated left hemidiaphragm. Postsurgical changes left hilar region.  Heart size difficult adequately assessed.  Mild pulmonary vascular prominence centrally without pulmonary edema.  Calcified aorta.  IMPRESSION: Left-sided chest tube is in place. Small left lateral and apical pneumothorax minimally changed from the prior exam.   Electronically Signed   By: Bridgett Larsson M.D.   On: 06/05/2013 07:42   Dg Chest Port 1 View  06/04/2013   CLINICAL DATA:  Evaluate chest 2  EXAM: PORTABLE CHEST - 1 VIEW  COMPARISON:  Portable chest x-ray of 06/03/2013  FINDINGS: The right IJ central venous line has been removed. No pneumothorax is seen. There is little change in  aeration. A left chest tube remains and only a tiny left apical pneumothorax is present which has decreased slightly in volume.  IMPRESSION: Slight decrease in left apical pneumothorax with left chest tube remaining.   Electronically Signed   By: Dwyane Dee M.D.   On: 06/04/2013 08:01   Dg Chest Port 1 View  06/03/2013   CLINICAL DATA:  Muscle sparing left upper lobe  EXAM: PORTABLE CHEST - 1 VIEW  COMPARISON:  06/02/2013  FINDINGS: Interval removal of 1 of the 2 left chest tubes. Enlargement of the left pneumothorax, now approximately 10%. Stable subcutaneous emphysema on the left. Elevation of the left hemidiaphragm, stable. Right lung is clear. Right central line is unchanged.  IMPRESSION: Interval removal of 1 of the 2 left chest tubes with enlargement of the left pneumothorax, now approximately 10%.   Electronically Signed   By: Charlett Nose M.D.   On: 06/03/2013 07:32   Dg Chest Port 1 View  06/02/2013   CLINICAL DATA:  Shortness of breath  EXAM: PORTABLE CHEST - 1 VIEW  COMPARISON:  06/01/2013  FINDINGS: Cardiac shadow is stable. A right-sided central venous line and 2 left thoracostomy catheters are again noted. The left hemidiaphragm is elevated. The tiny left apical pneumothorax remains although it is stable from the prior exam. No new focal infiltrate is seen. No acute bony abnormality is noted.  IMPRESSION: Tubes and lines as described.  Stable tiny left pneumothorax.   Electronically Signed   By: Alcide Clever M.D.   On: 06/02/2013 07:45   Dg Chest Port 1 View  06/01/2013   CLINICAL DATA:  Postop. Check chest tubes.  EXAM: PORTABLE CHEST - 1 VIEW  COMPARISON:  05/30/2013.  FINDINGS: There is a tiny pneumothorax which has developed since the prior study. Subcutaneous air on the left has mildly decreased.  Dual left chest tubes are stable. The right lung remains essentially clear. No right pneumothorax.  Right internal jugular central venous line is stable with its tip in the  lower superior  vena cava.  IMPRESSION: New minimal left pneumothorax.  Support apparatus is stable. No other change.   Electronically Signed   By: Amie Portland M.D.   On: 06/01/2013 07:39   Dg Chest Port 1 View  05/30/2013   CLINICAL DATA:  Status post left thoracotomy with left upper lobe resection.  EXAM: PORTABLE CHEST - 1 VIEW  COMPARISON:  05/29/2013  FINDINGS: Dual left chest tubes are stable with their tips at the left apex. No pneumothorax. Volume loss on the left subsequent to the left upper lobectomy is stable.  Mild basilar atelectasis is stable. No convincing infiltrate or pulmonary edema.  Right internal jugular central venous line tip lies just above the cavoatrial junction, also stable.  IMPRESSION: 1. No change from the previous day's study. 2. Support apparatus is well positioned. 3. No pneumothorax. Mild stable basilar atelectasis.   Electronically Signed   By: Amie Portland M.D.   On: 05/30/2013 07:23   Dg Chest Portable 1 View  05/29/2013   CLINICAL DATA:  Left upper lobectomy. Thoracotomy. Postoperative radiographs.  EXAM: PORTABLE CHEST - 1 VIEW  COMPARISON:  05/27/2013 radiograph.  FINDINGS: Dual left thoracostomy tubes are present. Surgical clips are now present in the superior left hilum compatible with left upper lobectomy. There is no pneumothorax. Right IJ central line is present with the tip at the junction of the superior vena cava and right atrium. Basilar atelectasis is present.  IMPRESSION: Support apparatus in good position following left upper lobectomy. Dual left thoracostomy tubes. No pneumothorax.   Electronically Signed   By: Andreas Newport M.D.   On: 05/29/2013 14:13    ASSESSMENT: this is a very pleasant 72 years old white male recently diagnosed with a stage IIIA (T2a, N2, M0) non-small cell lung cancer, adenocarcinoma involving the left upper lobe and mediastinal lymphadenopathy diagnosed in August of 2014. The patient is status post left upper lobectomy with mediastinal lymph  node dissection on 05/29/2013.   PLAN: I had a lengthy discussion with the patient and his family today about his current disease stage, prognosis and treatment options. I provided the patient with adjuvant on line assessment for the five-year survival which is around 25% as well as the benefits of adjuvant chemotherapy for his stage of the disease which adds absolute benefit of 7%.  I recommended for the patient to consider adjuvant chemotherapy with 4 cycles of platinum-based therapy and I recommended for him regimen consisting of cisplatin 75 mg/M2 and Alimta 500 mg/M2 every 3 weeks. I discussed with the patient adverse effect of the chemotherapy including but not limited to alopecia, low suppression, nausea and vomiting, peripheral neuropathy, liver or renal dysfunction as well as hearing deficit. I will arrange for the patient to receive the first cycle of this treatment in 3 weeks. He would like to proceed with the treatment as planned. He will receive a chemotherapy education class as well as vitamin B12 injection 1 week before his treatment. I would also call his pharmacy with prescription for Compazine 10 mg by mouth every 6 hours as needed for nausea, Decadron 4 mg by mouth twice a day the day before, day of and day after the chemotherapy in addition to folic acid 1 mg by mouth daily to start one week before the first cycle of his treatment. After completion of his adjuvant chemotherapy, the patient would benefit from a course of adjuvant radiotherapy to the mediastinum. He was seen later today by Dr. Mitzi Hansen for evaluation  and discussion of this option. The patient would come back for follow up visit in 3 weeks with the start of the first cycle of his treatment. I gave the patient and his family the time to ask questions and I answered them completely to their satisfaction. He was advised to call immediately if he has any concerning symptoms in the interval. The patient voices understanding of  current disease status and treatment options and is in agreement with the current care plan.  All questions were answered. The patient knows to call the clinic with any problems, questions or concerns. We can certainly see the patient much sooner if necessary.  Thank you so much for allowing me to participate in the care of Jordan Mccullough. I will continue to follow up the patient with you and assist in his care.  I spent 55 minutes counseling the patient face to face. The total time spent in the appointment was 80 minutes.  Erven Ramson K. 06/21/2013, 9:54 PM

## 2013-06-24 ENCOUNTER — Telehealth: Payer: Self-pay | Admitting: Internal Medicine

## 2013-06-24 ENCOUNTER — Encounter: Payer: Self-pay | Admitting: *Deleted

## 2013-06-24 DIAGNOSIS — Z483 Aftercare following surgery for neoplasm: Secondary | ICD-10-CM

## 2013-06-24 NOTE — Telephone Encounter (Signed)
Called pt left message regarding appt on 11/17 and 11/18. advised pt to get new appt calendar

## 2013-06-24 NOTE — Progress Notes (Signed)
[employer] results on line.  Not completed yet as of 06/24/13 at 10:30

## 2013-06-26 ENCOUNTER — Telehealth: Payer: Self-pay | Admitting: Internal Medicine

## 2013-06-26 ENCOUNTER — Telehealth: Payer: Self-pay | Admitting: *Deleted

## 2013-06-26 DIAGNOSIS — C3412 Malignant neoplasm of upper lobe, left bronchus or lung: Secondary | ICD-10-CM | POA: Insufficient documentation

## 2013-06-26 HISTORY — DX: Malignant neoplasm of upper lobe, left bronchus or lung: C34.12

## 2013-06-26 NOTE — Progress Notes (Signed)
Radiation Oncology         [phone]) [phone] ________________________________  Name: Jordan Mccullough MRN: [id number]  Date: 06/19/2013  DOB: 05-19-1941  CC:Marga Melnick, MD  Alleen Borne, MD    Si Gaul, M.D.  REFERRING PHYSICIAN: Alleen Borne, MD   DIAGNOSIS: The encounter diagnosis was Malignant neoplasm of upper lobe, bronchus or lung, left.   HISTORY OF PRESENT ILLNESS::Jordan Mccullough is a 72 y.o. male who is seen for an initial consultation visit. The patient was found to have a suspicious abnormality within the left lung on a CT angiogram of the chest. This was performed for evaluation for an abdominal aortic aneurysm in terms of routine followup. The CT scan revealed a 1.2 cm left upper lobe pulmonary nodule which was concerning for a bronchogenic carcinoma.  The patient proceeded to undergo a PET scan on 04/17/2013. This showed a 1.1 cm nodule in the lateral left upper lobe with a maximum SUV of 2.4. This was felt to be suspicious again for malignancy. The patient also underwent an MRI scan of the brain which did not reveal any intracranial metastases. On 05/01/2013, the patient proceeded to undergo a flexible video bronchoscopy with mediastinitis daily. No evidence of malignancy in multiple biopsied lymph nodes including level 4R, 4L, 7, 10 L., 10r.  The patient proceeded to undergo a left upper lobectomy and mediastinal lymph node dissection. Pathology revealed a 1.1 cm adenocarcinoma which focally involved the visceral pleura with lymphovascular invasion. Adenocarcinoma was present  in 4/6  examined lymph nodes including an N2 node.    the patient's case was discussed and multileaf is married thoracic conference this morning. I been asked to see the patient for consideration of postoperative radiation. He is also seeing Dr. Shirline Frees today in clinic as well. The patient indicates that he has been recovering well from his surgery. He does have some hoarseness from prior  procedures.   PREVIOUS RADIATION THERAPY: No   PAST MEDICAL HISTORY:  has a past medical history of Hyperlipidemia; Diverticulosis; Coronary artery disease; Peripheral vascular disease (1/14); AAA (abdominal aortic aneurysm); Hearing loss; and Myocardial infarction (1992).     PAST SURGICAL HISTORY: Past Surgical History  Procedure Laterality Date  . Ptca  1992  . Inguinal heriiorrhaphy bilaterally    . Colonoscopy w/ polypectomy  2003     negative 2010,due 2020; Dr Juanda Chance  . Pilonidal cyst excision    . Rotator cuff repair      Bilateral  . Cystoscopy/retrograde/ureteroscopy Bilateral 10/09/2012    Procedure: BILATERAL RETROGRADE bladder and urethral BIOPSY ;  Surgeon: Milford Cage, MD;  Location: [facility];  Service: Urology;  Laterality: Bilateral;  BILATERAL RETROGRADE  AND bladder and urethral BIOPSY    . Prostate biopsy N/A 10/09/2012    Procedure: PROSTATIC URETHRAL BIOPSY;  Surgeon: Milford Cage, MD;  Location: [facility];  Service: Urology;  Laterality: N/A;  PROSTATIC URETHRAL BIOPSY    . Hernia repair Bilateral     Inguinal  . Mediastinoscopy N/A 05/01/2013    Procedure: MEDIASTINOSCOPY;  Surgeon: Alleen Borne, MD;  Location: [facility] OR;  Service: Thoracic;  Laterality: N/A;  . Video bronchoscopy N/A 05/01/2013    Procedure: VIDEO BRONCHOSCOPY;  Surgeon: Alleen Borne, MD;  Location: [facility] OR;  Service: Thoracic;  Laterality: N/A;  . Coronary angioplasty      no stents  . Thorocotomy with lobectomy Left 05/29/2013    Procedure: LEFT THOROCOTOMY WITH LEFT UPPER LOBE LOBECTOMY;  Surgeon: Judie Grieve  Jennefer Bravo, MD;  Location: [facility] OR;  Service: Thoracic;  Laterality: Left;     FAMILY HISTORY: family history includes Cancer in his father and sister; Heart attack in his brother; Hyperlipidemia in his brother and sister; Hypertension in his brother and mother; Lung cancer in his sister; Prostate cancer in his father. There is no history of Diabetes or Stroke.   SOCIAL  HISTORY:  reports that he has quit smoking. His smoking use included Cigarettes and Cigars. He has a 48 pack-year smoking history. He quit smokeless tobacco use about 5 weeks ago. He reports that he drinks about 1.8 ounces of alcohol per week. He reports that he does not use illicit drugs.   ALLERGIES: Review of patient's allergies indicates no known allergies.   MEDICATIONS:  Current Outpatient Prescriptions  Medication Sig Dispense Refill  . aspirin EC 81 MG tablet Take 81 mg by mouth daily.      Marland Kitchen dexamethasone (DECADRON) 4 MG tablet 4 mg by mouth 3 times a day, the day before, day of and day after the chemotherapy.  40 tablet  0  . folic acid (FOLVITE) 1 MG tablet Take 1 tablet (1 mg total) by mouth daily.  30 tablet  2  . ibuprofen (ADVIL,MOTRIN) 200 MG tablet Take 200 mg by mouth every 6 (six) hours as needed for pain.      . Multiple Vitamin (MULTIVITAMIN) tablet Take 1 tablet by mouth every evening.       . nicotine (NICODERM CQ - DOSED IN MG/24 HOURS) 21 mg/24hr patch Place 1 patch onto the skin daily.  28 patch  1  . prochlorperazine (COMPAZINE) 10 MG tablet Take 1 tablet (10 mg total) by mouth every 6 (six) hours as needed.  60 tablet  0  . simvastatin (ZOCOR) 40 MG tablet Take 40 mg by mouth every evening.       No current facility-administered medications for this encounter.     REVIEW OF SYSTEMS:  A 15 point review of systems is documented in the electronic medical record. This was obtained by the nursing staff. However, I reviewed this with the patient to discuss relevant findings and make appropriate changes.  Pertinent items are noted in HPI.    PHYSICAL EXAM:  vitals were not taken for this visit.  General: Well-developed, in no acute distress HEENT: Normocephalic, atraumatic; oral cavity clear Neck: Supple without any lymphadenopathy Cardiovascular: Regular rate and rhythm Respiratory: Clear to auscultation bilaterally GI: Soft, nontender, normal bowel  sounds Extremities: No edema present Neuro: No focal deficits     LABORATORY DATA:  Lab Results  Component Value Date   WBC 7.9 06/01/2013   HGB 11.8* 06/01/2013   HCT 33.9* 06/01/2013   MCV 89.9 06/01/2013   PLT 200 06/01/2013   Lab Results  Component Value Date   NA 137 06/01/2013   K 3.6 06/01/2013   CL 101 06/01/2013   CO2 28 06/01/2013   Lab Results  Component Value Date   ALT 30 05/27/2013   AST 28 05/27/2013   ALKPHOS 49 05/27/2013   BILITOT 0.4 05/27/2013      RADIOGRAPHY: Dg Chest 2 View  06/11/2013   CLINICAL DATA:  Followup left chest surgery  EXAM: CHEST  2 VIEW  COMPARISON:  06/06/2013  FINDINGS: Left chest tube is again identified. No significant pneumothorax is seen. Postsurgical changes are noted in the left hilum with some volume loss. The right lung is clear. The cardiac shadow is within normal limits. No  acute bony abnormality is seen.  IMPRESSION: No evidence of pneumothorax.  No acute abnormality is noted.   Electronically Signed   By: Alcide Clever M.D.   On: 06/11/2013 11:14   Dg Chest 2 View  06/05/2013   CLINICAL DATA:  Clamping of left chest tube  EXAM: CHEST  2 VIEW  COMPARISON:  Portable chest x-ray of 06/05/2013  FINDINGS: The left apical pneumothorax has increased slightly in volume after clamping of the left chest tube. The left hemidiaphragm remains elevated. The right lung is clear. Cardiomegaly is stable.  IMPRESSION: Increase in volume of the left pneumothorax after clamping of the left chest tube.   Electronically Signed   By: Dwyane Dee M.D.   On: 06/05/2013 14:37   Dg Chest Port 1 View  06/06/2013   CLINICAL DATA:  Followup pneumothorax.  EXAM: PORTABLE CHEST - 1 VIEW  COMPARISON:  06/05/2013.  FINDINGS: Post left upper lobectomy per history provided. Left-sided chest tube remains in place. Slight decrease in size of left-sided pneumothorax with a very small left-sided pneumothorax remaining. Minimal amount of left-sided subcutaneous  emphysema.  Elevated left hemidiaphragm.  Slightly tortuous aorta. Cardiomegaly.  IMPRESSION: Slight decrease in size of left-sided pneumothorax with very small left-sided pneumothorax remaining. Otherwise no significant change. Please see above.   Electronically Signed   By: Bridgett Larsson M.D.   On: 06/06/2013 08:04   Dg Chest Port 1 View  06/05/2013   CLINICAL DATA:  Chest tube in place.  EXAM: PORTABLE CHEST - 1 VIEW  COMPARISON:  06/02/2013.  FINDINGS: Left-sided chest tube is in place. Small left lateral and apical pneumothorax minimally changed from the prior exam.  Elevated left hemidiaphragm. Postsurgical changes left hilar region.  Heart size difficult adequately assessed.  Mild pulmonary vascular prominence centrally without pulmonary edema.  Calcified aorta.  IMPRESSION: Left-sided chest tube is in place. Small left lateral and apical pneumothorax minimally changed from the prior exam.   Electronically Signed   By: Bridgett Larsson M.D.   On: 06/05/2013 07:42   Dg Chest Port 1 View  06/04/2013   CLINICAL DATA:  Evaluate chest 2  EXAM: PORTABLE CHEST - 1 VIEW  COMPARISON:  Portable chest x-ray of 06/03/2013  FINDINGS: The right IJ central venous line has been removed. No pneumothorax is seen. There is little change in aeration. A left chest tube remains and only a tiny left apical pneumothorax is present which has decreased slightly in volume.  IMPRESSION: Slight decrease in left apical pneumothorax with left chest tube remaining.   Electronically Signed   By: Dwyane Dee M.D.   On: 06/04/2013 08:01   Dg Chest Port 1 View  06/03/2013   CLINICAL DATA:  Muscle sparing left upper lobe  EXAM: PORTABLE CHEST - 1 VIEW  COMPARISON:  06/02/2013  FINDINGS: Interval removal of 1 of the 2 left chest tubes. Enlargement of the left pneumothorax, now approximately 10%. Stable subcutaneous emphysema on the left. Elevation of the left hemidiaphragm, stable. Right lung is clear. Right central line is unchanged.   IMPRESSION: Interval removal of 1 of the 2 left chest tubes with enlargement of the left pneumothorax, now approximately 10%.   Electronically Signed   By: Charlett Nose M.D.   On: 06/03/2013 07:32   Dg Chest Port 1 View  06/02/2013   CLINICAL DATA:  Shortness of breath  EXAM: PORTABLE CHEST - 1 VIEW  COMPARISON:  06/01/2013  FINDINGS: Cardiac shadow is stable. A right-sided central venous line  and 2 left thoracostomy catheters are again noted. The left hemidiaphragm is elevated. The tiny left apical pneumothorax remains although it is stable from the prior exam. No new focal infiltrate is seen. No acute bony abnormality is noted.  IMPRESSION: Tubes and lines as described.  Stable tiny left pneumothorax.   Electronically Signed   By: Alcide Clever M.D.   On: 06/02/2013 07:45   Dg Chest Port 1 View  06/01/2013   CLINICAL DATA:  Postop. Check chest tubes.  EXAM: PORTABLE CHEST - 1 VIEW  COMPARISON:  05/30/2013.  FINDINGS: There is a tiny pneumothorax which has developed since the prior study. Subcutaneous air on the left has mildly decreased.  Dual left chest tubes are stable. The right lung remains essentially clear. No right pneumothorax.  Right internal jugular central venous line is stable with its tip in the lower superior vena cava.  IMPRESSION: New minimal left pneumothorax.  Support apparatus is stable. No other change.   Electronically Signed   By: Amie Portland M.D.   On: 06/01/2013 07:39   Dg Chest Port 1 View  05/30/2013   CLINICAL DATA:  Status post left thoracotomy with left upper lobe resection.  EXAM: PORTABLE CHEST - 1 VIEW  COMPARISON:  05/29/2013  FINDINGS: Dual left chest tubes are stable with their tips at the left apex. No pneumothorax. Volume loss on the left subsequent to the left upper lobectomy is stable.  Mild basilar atelectasis is stable. No convincing infiltrate or pulmonary edema.  Right internal jugular central venous line tip lies just above the cavoatrial junction, also  stable.  IMPRESSION: 1. No change from the previous day's study. 2. Support apparatus is well positioned. 3. No pneumothorax. Mild stable basilar atelectasis.   Electronically Signed   By: Amie Portland M.D.   On: 05/30/2013 07:23   Dg Chest Portable 1 View  05/29/2013   CLINICAL DATA:  Left upper lobectomy. Thoracotomy. Postoperative radiographs.  EXAM: PORTABLE CHEST - 1 VIEW  COMPARISON:  05/27/2013 radiograph.  FINDINGS: Dual left thoracostomy tubes are present. Surgical clips are now present in the superior left hilum compatible with left upper lobectomy. There is no pneumothorax. Right IJ central line is present with the tip at the junction of the superior vena cava and right atrium. Basilar atelectasis is present.  IMPRESSION: Support apparatus in good position following left upper lobectomy. Dual left thoracostomy tubes. No pneumothorax.   Electronically Signed   By: Andreas Newport M.D.   On: 05/29/2013 14:13       IMPRESSION: The patient is status post a left upper lobectomy and mediastinal lymph node dissection for a [id number] non-small cell lung cancer of the left upper lobe, adenocarcinoma. The patient I believe is an appropriate candidate for postoperative radiotherapy in conjunction with postoperative chemotherapy which has been discussed with him by Dr. Shirline Frees in clinic today.   I. discussed therefore the rationale of such a treatment to improve local control in this setting. We discussed the side effects and risks of such a treatment as well. The patient is interested in proceeding with further treatment.   PLAN:  The consensus recommendation is to proceed with chemotherapy and radiation treatment in sequential fashion. He will proceed with chemotherapy in the near future and I look forward to seeing him at the appropriate time towards the end of such a course to further discuss an anticipated course of radiation treatment.    I spent 60 minutes face to face with the patient and  more  than 50% of that time was spent in counseling and/or coordination of care.    ________________________________   Radene Gunning, MD, PhD

## 2013-06-26 NOTE — Telephone Encounter (Signed)
Ok per MD to move chemo to 11/20.  Appt moved, scheduler notified

## 2013-06-26 NOTE — Telephone Encounter (Signed)
Talked to pt and gave him appt for lab, chemo class md and chemo

## 2013-07-01 ENCOUNTER — Other Ambulatory Visit: Payer: Self-pay | Admitting: *Deleted

## 2013-07-01 ENCOUNTER — Ambulatory Visit (HOSPITAL_BASED_OUTPATIENT_CLINIC_OR_DEPARTMENT_OTHER): Payer: Medicare Other

## 2013-07-01 ENCOUNTER — Ambulatory Visit: Payer: Medicare Other

## 2013-07-01 VITALS — BP 158/81 | HR 81 | Temp 97.5°F | Resp 18

## 2013-07-01 DIAGNOSIS — Z5111 Encounter for antineoplastic chemotherapy: Secondary | ICD-10-CM

## 2013-07-01 DIAGNOSIS — C3492 Malignant neoplasm of unspecified part of left bronchus or lung: Secondary | ICD-10-CM

## 2013-07-01 DIAGNOSIS — C3410 Malignant neoplasm of upper lobe, unspecified bronchus or lung: Secondary | ICD-10-CM

## 2013-07-01 MED ORDER — CYANOCOBALAMIN 1000 MCG/ML IJ SOLN
1000.0000 ug | Freq: Once | INTRAMUSCULAR | Status: AC
  Administered 2013-07-01: 1000 ug via INTRAMUSCULAR

## 2013-07-01 MED ORDER — CYANOCOBALAMIN 1000 MCG/ML IJ SOLN
INTRAMUSCULAR | Status: AC
  Filled 2013-07-01: qty 1

## 2013-07-01 NOTE — Patient Instructions (Signed)
Cyanocobalamin, Vitamin B12 injection °What is this medicine? °CYANOCOBALAMIN (sye an oh koe BAL a min) is a man made form of vitamin B12. Vitamin B12 is used in the growth of healthy blood cells, nerve cells, and proteins in the body. It also helps with the metabolism of fats and carbohydrates. This medicine is used to treat people who can not absorb vitamin B12. °This medicine may be used for other purposes; ask your health care provider or pharmacist if you have questions. °COMMON BRAND NAME(S): Cyomin, LA-12 , Nutri-Twelve , Primabalt °What should I tell my health care provider before I take this medicine? °They need to know if you have any of these conditions: °-kidney disease °-Leber's disease °-megaloblastic anemia °-an unusual or allergic reaction to cyanocobalamin, cobalt, other medicines, foods, dyes, or preservatives °-pregnant or trying to get pregnant °-breast-feeding °How should I use this medicine? °This medicine is injected into a muscle or deeply under the skin. It is usually given by a health care professional in a clinic or doctor's office. However, your doctor may teach you how to inject yourself. Follow all instructions. °Talk to your pediatrician regarding the use of this medicine in children. Special care may be needed. °Overdosage: If you think you have taken too much of this medicine contact a poison control center or emergency room at once. °NOTE: This medicine is only for you. Do not share this medicine with others. °What if I miss a dose? °If you are given your dose at a clinic or doctor's office, call to reschedule your appointment. If you give your own injections and you miss a dose, take it as soon as you can. If it is almost time for your next dose, take only that dose. Do not take double or extra doses. °What may interact with this medicine? °-colchicine °-heavy alcohol intake °This list may not describe all possible interactions. Give your health care provider a list of all the  medicines, herbs, non-prescription drugs, or dietary supplements you use. Also tell them if you smoke, drink alcohol, or use illegal drugs. Some items may interact with your medicine. °What should I watch for while using this medicine? °Visit your doctor or health care professional regularly. You may need blood work done while you are taking this medicine. °You may need to follow a special diet. Talk to your doctor. Limit your alcohol intake and avoid smoking to get the best benefit. °What side effects may I notice from receiving this medicine? °Side effects that you should report to your doctor or health care professional as soon as possible: °-allergic reactions like skin rash, itching or hives, swelling of the face, lips, or tongue °-blue tint to skin °-chest tightness, pain °-difficulty breathing, wheezing °-dizziness °-red, swollen painful area on the leg °Side effects that usually do not require medical attention (report to your doctor or health care professional if they continue or are bothersome): °-diarrhea °-headache °This list may not describe all possible side effects. Call your doctor for medical advice about side effects. You may report side effects to FDA at 1-800-FDA-1088. °Where should I keep my medicine? °Keep out of the reach of children. °Store at room temperature between 15 and 30 degrees C (59 and 85 degrees F). Protect from light. Throw away any unused medicine after the expiration date. °NOTE: This sheet is a summary. It may not cover all possible information. If you have questions about this medicine, talk to your doctor, pharmacist, or health care provider. °© 2014, [employer]vier/Gold Standard. (2007-11-18   22:10:20) ° °

## 2013-07-01 NOTE — Progress Notes (Signed)
No other notes needed.  See patient education

## 2013-07-02 ENCOUNTER — Encounter: Payer: Self-pay | Admitting: Internal Medicine

## 2013-07-02 ENCOUNTER — Other Ambulatory Visit (INDEPENDENT_AMBULATORY_CARE_PROVIDER_SITE_OTHER): Payer: Medicare Other

## 2013-07-02 ENCOUNTER — Ambulatory Visit (INDEPENDENT_AMBULATORY_CARE_PROVIDER_SITE_OTHER): Payer: Medicare Other | Admitting: Internal Medicine

## 2013-07-02 ENCOUNTER — Ambulatory Visit (HOSPITAL_BASED_OUTPATIENT_CLINIC_OR_DEPARTMENT_OTHER)
Admission: RE | Admit: 2013-07-02 | Discharge: 2013-07-02 | Disposition: A | Discharge status: Home / Self Care | Payer: Medicare Other | Source: Ambulatory Visit | Attending: Internal Medicine | Admitting: Internal Medicine

## 2013-07-02 VITALS — BP 161/103 | HR 82 | Temp 98.6°F | Wt 147.0 lb

## 2013-07-02 DIAGNOSIS — C3490 Malignant neoplasm of unspecified part of unspecified bronchus or lung: Secondary | ICD-10-CM | POA: Insufficient documentation

## 2013-07-02 DIAGNOSIS — M542 Cervicalgia: Secondary | ICD-10-CM | POA: Insufficient documentation

## 2013-07-02 DIAGNOSIS — T887XXA Unspecified adverse effect of drug or medicament, initial encounter: Secondary | ICD-10-CM

## 2013-07-02 DIAGNOSIS — M5030 Other cervical disc degeneration, unspecified cervical region: Secondary | ICD-10-CM | POA: Insufficient documentation

## 2013-07-02 DIAGNOSIS — E785 Hyperlipidemia, unspecified: Secondary | ICD-10-CM

## 2013-07-02 DIAGNOSIS — I2510 Atherosclerotic heart disease of native coronary artery without angina pectoris: Secondary | ICD-10-CM | POA: Insufficient documentation

## 2013-07-02 DIAGNOSIS — M47812 Spondylosis without myelopathy or radiculopathy, cervical region: Secondary | ICD-10-CM | POA: Insufficient documentation

## 2013-07-02 MED ORDER — PREDNISONE 10 MG PO TABS: ORAL_TABLET | ORAL | Status: DC

## 2013-07-02 MED ORDER — CYCLOBENZAPRINE HCL 10 MG PO TABS: 10.0000 mg | ORAL_TABLET | Freq: Every evening | ORAL | Status: DC | PRN

## 2013-07-02 MED ORDER — HYDROCODONE-ACETAMINOPHEN 5-325 MG PO TABS: 1.0000 | ORAL_TABLET | Freq: Three times a day (TID) | ORAL | Status: DC | PRN

## 2013-07-02 NOTE — Progress Notes (Signed)
Pre visit review using our clinic review tool, if applicable. No additional management support is needed unless otherwise documented below in the visit note. 

## 2013-07-02 NOTE — Patient Instructions (Signed)
Get the XR at [facility], corner of [address] (10 minutes form here); they are open 24/7 [address]  [place], [place] [address] [phone]   Prednisone as prescribed for 5 days For pain take Tylenol  500 mg OTC 2 tabs a day every 8 hours as needed for pain If pain is severe instead of tylenol take hydrocodone, will cause drowsiness, be careful. At night you can take cyclobenzaprine, a muscle relaxant, it may also cause drowsiness. If the pain is not gradually improving in the next 10 days let us know. Also call if the  pain is severe

## 2013-07-02 NOTE — Assessment & Plan Note (Signed)
New onset of neck pain on the patient recently diagnosed with lung cancer, to have his next chemotherapy 07/10/2013 per pt . PET scan ~ 3 months ago did not show anything significant @ neck. Pain with some C7 radicular features. Plan: Flexeril, Vicodin, prednisone 20 mg x5, x-ray If not better will need further eval if

## 2013-07-02 NOTE — Progress Notes (Signed)
Subjective:    Patient ID: Jordan Mccullough, male    DOB: 03/21/1941, 72 y.o.   MRN: [id number]  HPI Acute visit Symptoms gradually started about 06/13/2013: He has pain that radiates from the base of the left side of the neck to the tricipital  area up to the elbow, pain is on and off, sometimes as intense as 6/10, last for a few seconds to 30 minutes, worse when he tilts his head to the right. No injury. Is not taking any specific pain medication for this He does have schedule and chemotherapy for cancer on the 20th  Past Medical History  Diagnosis Date  . Hyperlipidemia   . Diverticulosis   . Coronary artery disease     MI 1992, S/P  PTCA; negative stress test in November 2011 with no ischemia.   . Peripheral vascular disease 1/14    4.8x4.6 cm infrarenal abdominal aortic fusiform aneurysm, 1.5 cm right common iliac artery aneurysm  . AAA (abdominal aortic aneurysm)   . Hearing loss     Bilateral   . Myocardial infarction 1992    dr Clifton James   cardiac  md  . Lung cancer 10-14    non-small cell lung cancer   Past Surgical History  Procedure Laterality Date  . Ptca  1992  . Inguinal heriiorrhaphy bilaterally    . Colonoscopy w/ polypectomy  2003     negative 2010,due 2020; Dr Juanda Chance  . Pilonidal cyst excision    . Rotator cuff repair      Bilateral  . Cystoscopy/retrograde/ureteroscopy Bilateral 10/09/2012    Procedure: BILATERAL RETROGRADE bladder and urethral BIOPSY ;  Surgeon: Milford Cage, MD;  Location: [facility];  Service: Urology;  Laterality: Bilateral;  BILATERAL RETROGRADE  AND bladder and urethral BIOPSY    . Prostate biopsy N/A 10/09/2012    Procedure: PROSTATIC URETHRAL BIOPSY;  Surgeon: Milford Cage, MD;  Location: [facility];  Service: Urology;  Laterality: N/A;  PROSTATIC URETHRAL BIOPSY    . Hernia repair Bilateral     Inguinal  . Mediastinoscopy N/A 05/01/2013    Procedure: MEDIASTINOSCOPY;  Surgeon: Alleen Borne, MD;  Location: [facility] OR;   Service: Thoracic;  Laterality: N/A;  . Video bronchoscopy N/A 05/01/2013    Procedure: VIDEO BRONCHOSCOPY;  Surgeon: Alleen Borne, MD;  Location: [facility] OR;  Service: Thoracic;  Laterality: N/A;  . Coronary angioplasty      no stents  . Thorocotomy with lobectomy Left 05/29/2013    Procedure: LEFT THOROCOTOMY WITH LEFT UPPER LOBE LOBECTOMY;  Surgeon: Alleen Borne, MD;  Location: [facility] OR;  Service: Thoracic;  Laterality: Left;     Review of Systems Denies any tingling in the lower extremities, no bladder or bowel incontinence, no gait abnormality. Occasionally, has tingling in the whole left hand at night, this is going on for a while. He had surgery on the left side of the chest approximately 2 weeks prior to the onset of the neck pain, the area seems to be healing well to pt and he denies pain in the chest.     Objective:   Physical Exam BP 161/103  Pulse 82  Temp(Src) 98.6 F (37 C)  Wt 147 lb (66.679 kg)  SpO2 99% General -- alert, well-developed, NAD.  Neck --ROM wnl, Not tender to palpation at the cervical spine  Extremities-- no pretibial edema bilaterally ; Shoulder symmetric and normal range of motion Neurologic--  alert & oriented X3. Speech normal, gait normal, strength normal  in all extremities.  DTRs symmetric  Psych-- Cognition and judgment appear intact. Cooperative with normal attention span and concentration. No anxious appearing , no depressed appearing.     Assessment & Plan:

## 2013-07-03 LAB — COMPREHENSIVE METABOLIC PANEL
ALT: 33 U/L (ref 0–53)
AST: 23 U/L (ref 0–37)
Albumin: 4.2 g/dL (ref 3.5–5.2)
Alkaline Phosphatase: 60 U/L (ref 39–117)
BUN: 11 mg/dL (ref 6–23)
CO2: 29 mEq/L (ref 19–32)
Calcium: 9.9 mg/dL (ref 8.4–10.5)
Chloride: 102 mEq/L (ref 96–112)
Creatinine, Ser: 0.7 mg/dL (ref 0.4–1.5)
GFR: 119.99 mL/min (ref >60.00)
Glucose, Bld: 105 mg/dL — ABNORMAL HIGH (ref 70–99)
Potassium: 4.6 mEq/L (ref 3.5–5.1)
Sodium: 138 mEq/L (ref 135–145)
Total Bilirubin: 0.5 mg/dL (ref 0.3–1.2)
Total Protein: 7.3 g/dL (ref 6.0–8.3)

## 2013-07-03 LAB — CBC WITH DIFFERENTIAL/PLATELET
Basophils Absolute: 0 10*3/uL (ref 0.0–0.1)
Basophils Relative: 0.6 % (ref 0.0–3.0)
Eosinophils Absolute: 0.2 10*3/uL (ref 0.0–0.7)
Eosinophils Relative: 3.6 % (ref 0.0–5.0)
HCT: 42.2 % (ref 39.0–52.0)
Hemoglobin: 14.3 g/dL (ref 13.0–17.0)
Lymphocytes Relative: 27.1 % (ref 12.0–46.0)
Lymphs Abs: 1.6 10*3/uL (ref 0.7–4.0)
MCHC: 33.8 g/dL (ref 30.0–36.0)
MCV: 91.1 fl (ref 78.0–100.0)
Monocytes Absolute: 0.5 10*3/uL (ref 0.1–1.0)
Monocytes Relative: 8.8 % (ref 3.0–12.0)
Neutro Abs: 3.6 10*3/uL (ref 1.4–7.7)
Neutrophils Relative %: 59.9 % (ref 43.0–77.0)
Platelets: 205 10*3/uL (ref 150.0–400.0)
RBC: 4.63 Mil/uL (ref 4.22–5.81)
RDW: 14.6 % (ref 11.5–14.6)
WBC: 6 10*3/uL (ref 4.5–10.5)

## 2013-07-03 LAB — MAGNESIUM: Magnesium: 2 mg/dL (ref 1.5–2.5)

## 2013-07-04 ENCOUNTER — Ambulatory Visit: Payer: Medicare Other

## 2013-07-04 DIAGNOSIS — R7309 Other abnormal glucose: Secondary | ICD-10-CM

## 2013-07-04 LAB — HEMOGLOBIN A1C: Hgb A1c MFr Bld: 5.7 % (ref 4.6–6.5)

## 2013-07-07 ENCOUNTER — Ambulatory Visit: Payer: Medicare Other | Admitting: Physician Assistant

## 2013-07-07 ENCOUNTER — Other Ambulatory Visit: Payer: Medicare Other | Admitting: Lab

## 2013-07-08 ENCOUNTER — Ambulatory Visit: Payer: Medicare Other | Admitting: Internal Medicine

## 2013-07-08 ENCOUNTER — Other Ambulatory Visit: Payer: Medicare Other | Admitting: Lab

## 2013-07-08 ENCOUNTER — Encounter (HOSPITAL_COMMUNITY): Payer: Self-pay

## 2013-07-09 ENCOUNTER — Encounter: Payer: Self-pay | Admitting: Physician Assistant

## 2013-07-09 ENCOUNTER — Other Ambulatory Visit (HOSPITAL_BASED_OUTPATIENT_CLINIC_OR_DEPARTMENT_OTHER): Payer: Medicare Other

## 2013-07-09 ENCOUNTER — Ambulatory Visit (HOSPITAL_BASED_OUTPATIENT_CLINIC_OR_DEPARTMENT_OTHER): Payer: Medicare Other | Admitting: Physician Assistant

## 2013-07-09 ENCOUNTER — Telehealth: Payer: Self-pay | Admitting: Internal Medicine

## 2013-07-09 ENCOUNTER — Encounter (HOSPITAL_COMMUNITY): Payer: Self-pay

## 2013-07-09 DIAGNOSIS — C3410 Malignant neoplasm of upper lobe, unspecified bronchus or lung: Secondary | ICD-10-CM

## 2013-07-09 DIAGNOSIS — Z87891 Personal history of nicotine dependence: Secondary | ICD-10-CM

## 2013-07-09 DIAGNOSIS — R209 Unspecified disturbances of skin sensation: Secondary | ICD-10-CM

## 2013-07-09 DIAGNOSIS — C3492 Malignant neoplasm of unspecified part of left bronchus or lung: Secondary | ICD-10-CM

## 2013-07-09 LAB — CBC WITH DIFFERENTIAL/PLATELET
BASO%: 0.1 % (ref 0.0–2.0)
Basophils Absolute: 0 10*3/uL (ref 0.0–0.1)
EOS%: 0.1 % (ref 0.0–7.0)
Eosinophils Absolute: 0 10*3/uL (ref 0.0–0.5)
HCT: 43.4 % (ref 38.4–49.9)
HGB: 14.6 g/dL (ref 13.0–17.1)
LYMPH%: 6.3 % — ABNORMAL LOW (ref 14.0–49.0)
MCH: 30.6 pg (ref 27.2–33.4)
MCHC: 33.8 g/dL (ref 32.0–36.0)
MCV: 90.6 fL (ref 79.3–98.0)
MONO#: 0.1 10*3/uL (ref 0.1–0.9)
MONO%: 0.8 % (ref 0.0–14.0)
NEUT#: 9.3 10*3/uL — ABNORMAL HIGH (ref 1.5–6.5)
NEUT%: 92.7 % — ABNORMAL HIGH (ref 39.0–75.0)
Platelets: 305 10*3/uL (ref 140–400)
RBC: 4.79 10*6/uL (ref 4.20–5.82)
RDW: 14.1 % (ref 11.0–14.6)
WBC: 10.1 10*3/uL (ref 4.0–10.3)
lymph#: 0.6 10*3/uL — ABNORMAL LOW (ref 0.9–3.3)

## 2013-07-09 LAB — COMPREHENSIVE METABOLIC PANEL (CC13)
ALT: 44 U/L (ref 0–55)
AST: 27 U/L (ref 5–34)
Albumin: 4 g/dL (ref 3.5–5.0)
Alkaline Phosphatase: 69 U/L (ref 40–150)
Anion Gap: 10 mEq/L (ref 3–11)
BUN: 17.4 mg/dL (ref 7.0–26.0)
CO2: 26 mEq/L (ref 22–29)
Calcium: 10.6 mg/dL — ABNORMAL HIGH (ref 8.4–10.4)
Chloride: 103 mEq/L (ref 98–109)
Creatinine: 0.8 mg/dL (ref 0.7–1.3)
Glucose: 122 mg/dl (ref 70–140)
Potassium: 4.9 mEq/L (ref 3.5–5.1)
Sodium: 140 mEq/L (ref 136–145)
Total Bilirubin: 0.46 mg/dL (ref 0.20–1.20)
Total Protein: 8 g/dL (ref 6.4–8.3)

## 2013-07-09 LAB — MAGNESIUM (CC13): Magnesium: 2.2 mg/dl (ref 1.5–2.5)

## 2013-07-09 NOTE — Patient Instructions (Signed)
Followup in one week for a symptom management visit

## 2013-07-09 NOTE — Telephone Encounter (Signed)
Gave pt appt for lab,ML and chemo for November and December 2014

## 2013-07-09 NOTE — Progress Notes (Signed)
No images are attached to the encounter. No scans are attached to the encounter. No scans are attached to the encounter. [facility] SHARED VISIT PROGRESS NOTE  Marga Melnick, MD [phone] [address] [facility] [address] [place] [address]  DIAGNOSIS: Stage IIIA (T2a., N2, M0) non-small cell lung cancer adenocarcinoma with negative EGFR mutation and negative ALK gene translocation involving the left upper lobe middle mediastinal lymphadenopathy diagnosed in August of 2014  Lung cancer   Primary site: Lung (Left)   Staging method: AJCC 7th Edition   Clinical: Stage IIIA (T2a, N2, M0) signed by Si Gaul, MD on 06/19/2013  5:06 PM   Summary: Stage IIIA (T2a, N2, M0).  Molecular biomarkers: Positive for NF1E1075fs*7, PTPN11 G503V, ATM splice site 7516-7516-1AG>TT, TP53 E271K, SETD2 N12fs*17 and SPOP E47K                                          Negative for: RET, ALK, BRAF, KRAS, ERBB2, MET and EGFR.  PRIOR THERAPY: Status post left upper lobectomy with mediastinal lymph node dissection on 05/29/2013  CURRENT THERAPY: Adjuvant chemotherapy with cisplatin at 75 mg per meter squared and Alimta at 500 mg per meter squared given every 3 weeks for a total of 4 cycles. First cycle to be given on 07/10/2013  DISEASE STAGE: Lung cancer   Primary site: Lung (Left)   Staging method: AJCC 7th Edition   Clinical: Stage IIIA (T2a, N2, M0) signed by Si Gaul, MD on 06/19/2013  5:06 PM   Summary: Stage IIIA (T2a, N2, M0)  CHEMOTHERAPY INTENT: Control/curative  CURRENT # OF CHEMOTHERAPY CYCLES: 0  CURRENT ANTIEMETICS: Aloxi, dexamethasone, Emend, Compazine  CURRENT SMOKING STATUS: Former smoker  ORAL CHEMOTHERAPY AND CONSENT: n/a  CURRENT BISPHOSPHONATES USE: none  PAIN MANAGEMENT:  NARCOTICS INDUCED CONSTIPATION:   LIVING WILL AND CODE STATUS:    INTERVAL HISTORY: TORON BOWRING 72 y.o. male returns for a scheduled regular office visit for followup of his  recently diagnosed stage IIIa (T2 A., N2, M0) non-small cell lung cancer adenocarcinoma involving the left upper lobe and mediastinal lymphadenopathy. This was diagnosed in August of 2014. He is status post left upper lobectomy with mediastinal lymph node dissection on 05/29/2013. He has some residual soreness related to his surgery that was affecting the left side of his neck in his left arm with some tingling in the thumb first and second fingers left hand. The pain has resolved and he only intermittently has the tingling in his fingers. Patient tells me that he prefers Tuesdays for treatment days in the future if at all possible as well as for his weekly labs and appointments.   MEDICAL HISTORY: Past Medical History  Diagnosis Date  . Hyperlipidemia   . Diverticulosis   . Coronary artery disease     MI 1992, S/P  PTCA; negative stress test in November 2011 with no ischemia.   . Peripheral vascular disease 1/14    4.8x4.6 cm infrarenal abdominal aortic fusiform aneurysm, 1.5 cm right common iliac artery aneurysm  . AAA (abdominal aortic aneurysm)   . Hearing loss     Bilateral   . Myocardial infarction 1992    dr Clifton James   cardiac  md  . Lung cancer 10-14    non-small cell lung cancer    ALLERGIES:  has No Known Allergies.  MEDICATIONS:  Current Outpatient Prescriptions  Medication Sig Dispense Refill  . aspirin EC 81 MG tablet Take 81 mg by mouth daily.      Marland Kitchen dexamethasone (DECADRON) 4 MG tablet 4 mg by mouth 3 times a day, the day before, day of and day after the chemotherapy.  40 tablet  0  . folic acid (FOLVITE) 1 MG tablet Take 1 tablet (1 mg total) by mouth daily.  30 tablet  2  . Multiple Vitamin (MULTIVITAMIN) tablet Take 1 tablet by mouth every evening.       . nicotine (NICODERM CQ - DOSED IN MG/24 HOURS) 21 mg/24hr patch Place 1 patch onto the skin daily.  28 patch  1  . prochlorperazine (COMPAZINE) 10 MG tablet 10 mg.      . simvastatin (ZOCOR) 40 MG tablet Take 40 mg  by mouth every evening.      . cyclobenzaprine (FLEXERIL) 10 MG tablet Take 1 tablet (10 mg total) by mouth at bedtime as needed for muscle spasms.  21 tablet  0  . HYDROcodone-acetaminophen (NORCO/VICODIN) 5-325 MG per tablet Take 1 tablet by mouth every 8 (eight) hours as needed.  30 tablet  0  . ibuprofen (ADVIL,MOTRIN) 200 MG tablet Take 200 mg by mouth every 6 (six) hours as needed for pain.      . predniSONE (DELTASONE) 10 MG tablet 2 tablets a day x 5 days  10 tablet  0   No current facility-administered medications for this visit.    SURGICAL HISTORY:  Past Surgical History  Procedure Laterality Date  . Ptca  1992  . Inguinal heriiorrhaphy bilaterally    . Colonoscopy w/ polypectomy  2003     negative 2010,due 2020; Dr Juanda Chance  . Pilonidal cyst excision    . Rotator cuff repair      Bilateral  . Cystoscopy/retrograde/ureteroscopy Bilateral 10/09/2012    Procedure: BILATERAL RETROGRADE bladder and urethral BIOPSY ;  Surgeon: Milford Cage, MD;  Location: [facility];  Service: Urology;  Laterality: Bilateral;  BILATERAL RETROGRADE  AND bladder and urethral BIOPSY    . Prostate biopsy N/A 10/09/2012    Procedure: PROSTATIC URETHRAL BIOPSY;  Surgeon: Milford Cage, MD;  Location: [facility];  Service: Urology;  Laterality: N/A;  PROSTATIC URETHRAL BIOPSY    . Hernia repair Bilateral     Inguinal  . Mediastinoscopy N/A 05/01/2013    Procedure: MEDIASTINOSCOPY;  Surgeon: Alleen Borne, MD;  Location: [facility] OR;  Service: Thoracic;  Laterality: N/A;  . Video bronchoscopy N/A 05/01/2013    Procedure: VIDEO BRONCHOSCOPY;  Surgeon: Alleen Borne, MD;  Location: [facility] OR;  Service: Thoracic;  Laterality: N/A;  . Coronary angioplasty      no stents  . Thorocotomy with lobectomy Left 05/29/2013    Procedure: LEFT THOROCOTOMY WITH LEFT UPPER LOBE LOBECTOMY;  Surgeon: Alleen Borne, MD;  Location: [facility] OR;  Service: Thoracic;  Laterality: Left;    REVIEW OF SYSTEMS:  Constitutional:  negative Eyes: negative Ears, nose, mouth, throat, and face: negative Respiratory: negative Cardiovascular: negative Gastrointestinal: negative Genitourinary:negative Integument/breast: negative Hematologic/lymphatic: negative Musculoskeletal:positive for myalgias and Residual soreness from his left upper lobe lobectomy surgery Neurological: positive for paresthesia Behavioral/Psych: negative Endocrine: negative Allergic/Immunologic: negative   PHYSICAL EXAMINATION: General appearance: alert, cooperative, appears stated age and no distress Head: Normocephalic, without obvious abnormality, atraumatic Neck: no adenopathy, no carotid bruit, no JVD, supple, symmetrical, trachea midline and thyroid not enlarged, symmetric, no tenderness/mass/nodules Lymph nodes: Cervical, supraclavicular, and axillary nodes normal. Resp: clear to  auscultation bilaterally Cardio: S1, S2 normal and Occasional missed/skipped beat(s) GI: soft, non-tender; bowel sounds normal; no masses,  no organomegaly Extremities: extremities normal, atraumatic, no cyanosis or edema Neurologic: Alert and oriented X 3, normal strength and tone. Normal symmetric reflexes. Normal coordination and gait  ECOG PERFORMANCE STATUS: 1 - Symptomatic but completely ambulatory  There were no vitals taken for this visit.  LABORATORY DATA: Lab Results  Component Value Date   WBC 10.1 07/09/2013   HGB 14.6 07/09/2013   HCT 43.4 07/09/2013   MCV 90.6 07/09/2013   PLT 305 07/09/2013      Chemistry      Component Value Date/Time   NA 140 07/09/2013 1330   NA 138 07/03/2013 1406   K 4.9 07/09/2013 1330   K 4.6 07/03/2013 1406   CL 102 07/03/2013 1406   CO2 26 07/09/2013 1330   CO2 29 07/03/2013 1406   BUN 17.4 07/09/2013 1330   BUN 11 07/03/2013 1406   CREATININE 0.8 07/09/2013 1330   CREATININE 0.7 07/03/2013 1406   CREATININE 0.76 04/11/2013 0928      Component Value Date/Time   CALCIUM 10.6* 07/09/2013 1330   CALCIUM  9.9 07/03/2013 1406   ALKPHOS 69 07/09/2013 1330   ALKPHOS 60 07/03/2013 1406   AST 27 07/09/2013 1330   AST 23 07/03/2013 1406   ALT 44 07/09/2013 1330   ALT 33 07/03/2013 1406   BILITOT 0.46 07/09/2013 1330   BILITOT 0.5 07/03/2013 1406       RADIOGRAPHIC STUDIES:  Dg Chest 2 View  06/11/2013   CLINICAL DATA:  Followup left chest surgery  EXAM: CHEST  2 VIEW  COMPARISON:  06/06/2013  FINDINGS: Left chest tube is again identified. No significant pneumothorax is seen. Postsurgical changes are noted in the left hilum with some volume loss. The right lung is clear. The cardiac shadow is within normal limits. No acute bony abnormality is seen.  IMPRESSION: No evidence of pneumothorax.  No acute abnormality is noted.   Electronically Signed   By: Alcide Clever M.D.   On: 06/11/2013 11:14   Dg Cervical Spine Complete  07/02/2013   CLINICAL DATA:  Left-sided neck discomfort without history of trauma. The patient has history of lung malignancy and coronary artery disease  EXAM: CERVICAL SPINE  4+ VIEWS  COMPARISON:  None  FINDINGS: The cervical vertebral bodies are preserved in height. Mild disc space narrowing at the C5-6 and C6-7 is present with small anterior endplate osteophytes. There is mild facet joint degenerative change at multiple levels. The oblique views reveal mild bony encroachment upon the neural foramina bilaterally, especially inferiorly. The prevertebral soft tissue spaces appear normal. The odontoid is intact and the lateral masses of C1 align normally with those of C2. The observed portions of the 1st and 2nd ribs appear normal.  IMPRESSION: There are mild degenerative disc and facet joint changes within the cervical spine. There is no evidence of a compression fracture.   Electronically Signed   By: David  [place]   On: 07/02/2013 09:26     ASSESSMENT/PLAN: Patient is a very pleasant 72 year old Caucasian male recently diagnosed with stage IIIa (T2 A., N2, M0) non-small cell lung  cancer, adenocarcinoma involving the left upper lobe and mediastinal lymphadenopathy diagnosed in August of 2014. He is status post left upper lobectomy with mediastinal lymph node dissection on 05/29/2013. Patient was discussed with also seen by Dr. Arbutus Ped. He will proceed with his first cycle of adjuvant chemotherapy as scheduled on  07/10/2013 with cisplatin at 75 mg per meter squared and Alimta at 500 mg per meter squared given every 3 weeks for a total of 4 cycles. He'll return in one week for a symptom management visit. We will alter his schedule so that his treatment days and weekly labs and provider visits are on Tuesdays. The instructions for his dexamethasone were clarified for the take 4 mg by mouth twice daily the day before the day of and day after chemotherapy.     Laural Benes, Latrish Mogel E, PA-C     All questions were answered. The patient knows to call the clinic with any problems, questions or concerns. We can certainly see the patient much sooner if necessary.   ADDENDUM: Hematology/Oncology Attending: I had a face to face encounter with the patient. I recommended his care plan. He is a very pleasant 72 years old white male diagnosed with a stage IIIa non-small cell lung cancer, adenocarcinoma status post left upper lobectomy with mediastinal lymph node dissection. The patient is here today for evaluation before starting the first cycle of his adjuvant chemotherapy with cisplatin and Alimta. He is feeling fine with no specific complaints today. He denied having any significant chest pain or shortness of breath. He has no nausea or vomiting. The molecular studies of his tumor showed negative EGFR mutation and negative ALK gene translocation. I recommended for the patient to proceed with his adjuvant therapy tomorrow as scheduled. He would come back for followup visit in 3 weeks with the next cycle of his chemotherapy. He was advised to call immediately if he has any concerning symptoms in  the interval. Lajuana Matte., MD 07/10/2013

## 2013-07-10 ENCOUNTER — Encounter: Payer: Self-pay | Admitting: *Deleted

## 2013-07-10 ENCOUNTER — Ambulatory Visit (HOSPITAL_BASED_OUTPATIENT_CLINIC_OR_DEPARTMENT_OTHER): Payer: Medicare Other

## 2013-07-10 DIAGNOSIS — Z5111 Encounter for antineoplastic chemotherapy: Secondary | ICD-10-CM

## 2013-07-10 DIAGNOSIS — C3410 Malignant neoplasm of upper lobe, unspecified bronchus or lung: Secondary | ICD-10-CM

## 2013-07-10 MED ORDER — PALONOSETRON HCL INJECTION 0.25 MG/5ML
0.2500 mg | Freq: Once | INTRAVENOUS | Status: AC
  Administered 2013-07-10: 0.25 mg via INTRAVENOUS

## 2013-07-10 MED ORDER — SODIUM CHLORIDE 0.9 % IV SOLN
75.0000 mg/m2 | Freq: Once | INTRAVENOUS | Status: AC
  Administered 2013-07-10: 130 mg via INTRAVENOUS
  Filled 2013-07-10: qty 130

## 2013-07-10 MED ORDER — SODIUM CHLORIDE 0.9 % IV SOLN
150.0000 mg | Freq: Once | INTRAVENOUS | Status: AC
  Administered 2013-07-10: 150 mg via INTRAVENOUS
  Filled 2013-07-10: qty 5

## 2013-07-10 MED ORDER — DEXAMETHASONE SODIUM PHOSPHATE 20 MG/5ML IJ SOLN
INTRAMUSCULAR | Status: AC
  Filled 2013-07-10: qty 5

## 2013-07-10 MED ORDER — POTASSIUM CHLORIDE 2 MEQ/ML IV SOLN
Freq: Once | INTRAVENOUS | Status: AC
  Administered 2013-07-10: 09:00:00 via INTRAVENOUS
  Filled 2013-07-10: qty 10

## 2013-07-10 MED ORDER — SODIUM CHLORIDE 0.9 % IV SOLN
500.0000 mg/m2 | Freq: Once | INTRAVENOUS | Status: AC
  Administered 2013-07-10: 850 mg via INTRAVENOUS
  Filled 2013-07-10: qty 34

## 2013-07-10 MED ORDER — SODIUM CHLORIDE 0.9 % IV SOLN
Freq: Once | INTRAVENOUS | Status: AC
  Administered 2013-07-10: 09:00:00 via INTRAVENOUS

## 2013-07-10 MED ORDER — PALONOSETRON HCL INJECTION 0.25 MG/5ML
INTRAVENOUS | Status: AC
  Filled 2013-07-10: qty 5

## 2013-07-10 MED ORDER — DEXAMETHASONE SODIUM PHOSPHATE 20 MG/5ML IJ SOLN
12.0000 mg | Freq: Once | INTRAMUSCULAR | Status: AC
  Administered 2013-07-10: 10:00:00 via INTRAVENOUS

## 2013-07-14 ENCOUNTER — Telehealth: Payer: Self-pay | Admitting: *Deleted

## 2013-07-14 ENCOUNTER — Encounter: Payer: Self-pay | Admitting: Radiation Oncology

## 2013-07-14 NOTE — Telephone Encounter (Signed)
Pt's wife called stating that he is constipated.  He had a BM yesterday but it was painful.  Informed her about miralax and colace.  She verbalized understanding.  SLJ

## 2013-07-15 ENCOUNTER — Telehealth: Payer: Self-pay | Admitting: *Deleted

## 2013-07-15 ENCOUNTER — Other Ambulatory Visit: Payer: Medicare Other | Admitting: Lab

## 2013-07-15 NOTE — Telephone Encounter (Signed)
CALLED PATIENT TO INFORM OF [facility]  APPT. FOR 09-18-13, SPOKE WITH PATIENT AND HE IS AWARE OF THIS APPT.

## 2013-07-16 ENCOUNTER — Other Ambulatory Visit: Payer: Medicare Other | Admitting: Lab

## 2013-07-18 ENCOUNTER — Other Ambulatory Visit (HOSPITAL_BASED_OUTPATIENT_CLINIC_OR_DEPARTMENT_OTHER): Payer: Medicare Other

## 2013-07-18 ENCOUNTER — Other Ambulatory Visit: Payer: Medicare Other | Admitting: Lab

## 2013-07-18 ENCOUNTER — Ambulatory Visit (HOSPITAL_BASED_OUTPATIENT_CLINIC_OR_DEPARTMENT_OTHER): Payer: Medicare Other | Admitting: Physician Assistant

## 2013-07-18 ENCOUNTER — Encounter: Payer: Self-pay | Admitting: Physician Assistant

## 2013-07-18 DIAGNOSIS — C3410 Malignant neoplasm of upper lobe, unspecified bronchus or lung: Secondary | ICD-10-CM

## 2013-07-18 DIAGNOSIS — C3490 Malignant neoplasm of unspecified part of unspecified bronchus or lung: Secondary | ICD-10-CM

## 2013-07-18 DIAGNOSIS — C3492 Malignant neoplasm of unspecified part of left bronchus or lung: Secondary | ICD-10-CM

## 2013-07-18 DIAGNOSIS — R209 Unspecified disturbances of skin sensation: Secondary | ICD-10-CM

## 2013-07-18 DIAGNOSIS — R110 Nausea: Secondary | ICD-10-CM

## 2013-07-18 DIAGNOSIS — R109 Unspecified abdominal pain: Secondary | ICD-10-CM

## 2013-07-18 DIAGNOSIS — R5381 Other malaise: Secondary | ICD-10-CM

## 2013-07-18 DIAGNOSIS — Z87891 Personal history of nicotine dependence: Secondary | ICD-10-CM

## 2013-07-18 DIAGNOSIS — R141 Gas pain: Secondary | ICD-10-CM

## 2013-07-18 LAB — CBC WITH DIFFERENTIAL/PLATELET
BASO%: 0.8 % (ref 0.0–2.0)
Basophils Absolute: 0.1 10*3/uL (ref 0.0–0.1)
EOS%: 1.8 % (ref 0.0–7.0)
Eosinophils Absolute: 0.1 10*3/uL (ref 0.0–0.5)
HCT: 39.7 % (ref 38.4–49.9)
HGB: 13.4 g/dL (ref 13.0–17.1)
LYMPH%: 26.2 % (ref 14.0–49.0)
MCH: 30.7 pg (ref 27.2–33.4)
MCHC: 33.7 g/dL (ref 32.0–36.0)
MCV: 91.1 fL (ref 79.3–98.0)
MONO#: 0.7 10*3/uL (ref 0.1–0.9)
MONO%: 10.1 % (ref 0.0–14.0)
NEUT#: 4.5 10*3/uL (ref 1.5–6.5)
NEUT%: 61.1 % (ref 39.0–75.0)
Platelets: 258 10*3/uL (ref 140–400)
RBC: 4.36 10*6/uL (ref 4.20–5.82)
RDW: 13.9 % (ref 11.0–14.6)
WBC: 7.3 10*3/uL (ref 4.0–10.3)
lymph#: 1.9 10*3/uL (ref 0.9–3.3)

## 2013-07-18 LAB — COMPREHENSIVE METABOLIC PANEL (CC13)
ALT: 39 U/L (ref 0–55)
AST: 23 U/L (ref 5–34)
Albumin: 3.4 g/dL — ABNORMAL LOW (ref 3.5–5.0)
Alkaline Phosphatase: 66 U/L (ref 40–150)
Anion Gap: 10 mEq/L (ref 3–11)
BUN: 15.1 mg/dL (ref 7.0–26.0)
CO2: 25 mEq/L (ref 22–29)
Calcium: 9.5 mg/dL (ref 8.4–10.4)
Chloride: 102 mEq/L (ref 98–109)
Creatinine: 0.8 mg/dL (ref 0.7–1.3)
Glucose: 103 mg/dl (ref 70–140)
Potassium: 4.4 mEq/L (ref 3.5–5.1)
Sodium: 137 mEq/L (ref 136–145)
Total Bilirubin: 0.23 mg/dL (ref 0.20–1.20)
Total Protein: 7.2 g/dL (ref 6.4–8.3)

## 2013-07-18 LAB — MAGNESIUM (CC13): Magnesium: 2.2 mg/dl (ref 1.5–2.5)

## 2013-07-18 NOTE — Progress Notes (Signed)
No images are attached to the encounter. No scans are attached to the encounter. No scans are attached to the encounter. [facility] OFFICE PROGRESS NOTE  Marga Melnick, MD [phone] [address] [facility] [address] [place] [address]  DIAGNOSIS: Stage IIIA (T2a., N2, M0) non-small cell lung cancer adenocarcinoma with negative EGFR mutation and negative ALK gene translocation involving the left upper lobe middle mediastinal lymphadenopathy diagnosed in August of 2014  Lung cancer   Primary site: Lung (Left)   Staging method: AJCC 7th Edition   Clinical: Stage IIIA (T2a, N2, M0) signed by Si Gaul, MD on 06/19/2013  5:06 PM   Summary: Stage IIIA (T2a, N2, M0).  Molecular biomarkers: Positive for NF1E1026fs*7, PTPN11 G503V, ATM splice site 7516-7516-1AG>TT, TP53 E271K, SETD2 N172fs*17 and SPOP E47K                                          Negative for: RET, ALK, BRAF, KRAS, ERBB2, MET and EGFR.  PRIOR THERAPY: Status post left upper lobectomy with mediastinal lymph node dissection on 05/29/2013  CURRENT THERAPY: Adjuvant chemotherapy with cisplatin at 75 mg per meter squared and Alimta at 500 mg per meter squared given every 3 weeks for a total of 4 cycles. First cycle given on 07/10/2013  DISEASE STAGE: Lung cancer   Primary site: Lung (Left)   Staging method: AJCC 7th Edition   Clinical: Stage IIIA (T2a, N2, M0) signed by Si Gaul, MD on 06/19/2013  5:06 PM   Summary: Stage IIIA (T2a, N2, M0)  CHEMOTHERAPY INTENT: Control/curative  CURRENT # OF CHEMOTHERAPY CYCLES: 1  CURRENT ANTIEMETICS: Aloxi, dexamethasone, Emend, Compazine  CURRENT SMOKING STATUS: Former smoker  ORAL CHEMOTHERAPY AND CONSENT: n/a  CURRENT BISPHOSPHONATES USE: none  PAIN MANAGEMENT:  NARCOTICS INDUCED CONSTIPATION:   LIVING WILL AND CODE STATUS:    INTERVAL HISTORY: Jordan Mccullough 72 y.o. male returns for a symptom management visit after receiving his first cycle of  adjuvant chemotherapy with cisplatin and Alimta. Overall he tolerated the first cycle of chemotherapy relatively well with the exception of some nausea, increased belching, stomach cramps and fatigue. He also experienced some intermittent headache that was relieved with Tylenol and not associated with nausea vomiting double or blurred vision. He also had some issues with constipation that were relieved with Colace and Dulcolax. He denies any increase shortness of breath. [place] able [place]. He continues to complain of some numbness and tingling affecting the left, and first 2 fingers that extends up the forearm. These symptoms have been present since his surgery. He has been told this may be secondary sent to some nerve irritation. He states that Dr. Drue Novel prescribed prednisone to take twice daily for 5 days and he states that that helped tremendously. He currently takes Tylenol finds it to be a bit helpful as well. He is status post left upper lobectomy with mediastinal lymph node dissection on 05/29/2013. He has some residual soreness related to his surgery that was affecting the left side of his neck in his left arm with some tingling in the thumb first and second fingers left hand. The pain has resolved and he only intermittently has the tingling in his fingers.   MEDICAL HISTORY: Past Medical History  Diagnosis Date  . Hyperlipidemia   . Diverticulosis   .  Coronary artery disease     MI 1992, S/P  PTCA; negative stress test in November 2011 with no ischemia.   . Peripheral vascular disease 1/14    4.8x4.6 cm infrarenal abdominal aortic fusiform aneurysm, 1.5 cm right common iliac artery aneurysm  . AAA (abdominal aortic aneurysm)   . Hearing loss     Bilateral   . Myocardial infarction 1992    dr Clifton James   cardiac  md  . Lung cancer 10-14    non-small cell lung cancer    ALLERGIES:  has No Known Allergies.  MEDICATIONS:  Current  Outpatient Prescriptions  Medication Sig Dispense Refill  . aspirin EC 81 MG tablet Take 81 mg by mouth daily.      Marland Kitchen dexamethasone (DECADRON) 4 MG tablet Take 4 mg by mouth 2 (two) times daily. 4 mg by mouth 2 times a day, the day before, day of and day after the chemotherapy.      . folic acid (FOLVITE) 1 MG tablet Take 1 tablet (1 mg total) by mouth daily.  30 tablet  2  . Multiple Vitamin (MULTIVITAMIN) tablet Take 1 tablet by mouth every evening.       . nicotine (NICODERM CQ - DOSED IN MG/24 HOURS) 21 mg/24hr patch Place 1 patch onto the skin daily.  28 patch  1  . prochlorperazine (COMPAZINE) 10 MG tablet 10 mg.      . simvastatin (ZOCOR) 40 MG tablet Take 40 mg by mouth every evening.      Marland Kitchen UNABLE TO FIND Aloe Vera Juice       No current facility-administered medications for this visit.    SURGICAL HISTORY:  Past Surgical History  Procedure Laterality Date  . Ptca  1992  . Inguinal heriiorrhaphy bilaterally    . Colonoscopy w/ polypectomy  2003     negative 2010,due 2020; Dr Juanda Chance  . Pilonidal cyst excision    . Rotator cuff repair      Bilateral  . Cystoscopy/retrograde/ureteroscopy Bilateral 10/09/2012    Procedure: BILATERAL RETROGRADE bladder and urethral BIOPSY ;  Surgeon: Milford Cage, MD;  Location: [facility];  Service: Urology;  Laterality: Bilateral;  BILATERAL RETROGRADE  AND bladder and urethral BIOPSY    . Prostate biopsy N/A 10/09/2012    Procedure: PROSTATIC URETHRAL BIOPSY;  Surgeon: Milford Cage, MD;  Location: [facility];  Service: Urology;  Laterality: N/A;  PROSTATIC URETHRAL BIOPSY    . Hernia repair Bilateral     Inguinal  . Mediastinoscopy N/A 05/01/2013    Procedure: MEDIASTINOSCOPY;  Surgeon: Alleen Borne, MD;  Location: [facility] OR;  Service: Thoracic;  Laterality: N/A;  . Video bronchoscopy N/A 05/01/2013    Procedure: VIDEO BRONCHOSCOPY;  Surgeon: Alleen Borne, MD;  Location: [facility] OR;  Service: Thoracic;  Laterality: N/A;  . Coronary  angioplasty      no stents  . Thorocotomy with lobectomy Left 05/29/2013    Procedure: LEFT THOROCOTOMY WITH LEFT UPPER LOBE LOBECTOMY;  Surgeon: Alleen Borne, MD;  Location: [facility] OR;  Service: Thoracic;  Laterality: Left;    REVIEW OF SYSTEMS:  Constitutional: negative Eyes: negative Ears, nose, mouth, throat, and face: negative Respiratory: negative Cardiovascular: negative Gastrointestinal: negative Genitourinary:negative Integument/breast: negative Hematologic/lymphatic: negative Musculoskeletal:positive for myalgias and Residual soreness from his left upper lobe lobectomy surgery Neurological: positive for paresthesia Behavioral/Psych: negative Endocrine: negative Allergic/Immunologic: negative   PHYSICAL EXAMINATION: General appearance: alert, cooperative, appears stated age and no distress Head: Normocephalic, without obvious abnormality,  atraumatic Neck: no adenopathy, no carotid bruit, no JVD, supple, symmetrical, trachea midline and thyroid not enlarged, symmetric, no tenderness/mass/nodules Lymph nodes: Cervical, supraclavicular, and axillary nodes normal. Resp: clear to auscultation bilaterally Cardio: S1, S2 normal and Occasional missed/skipped beat(s) GI: soft, non-tender; bowel sounds normal; no masses,  no organomegaly Extremities: extremities normal, atraumatic, no cyanosis or edema Neurologic: Alert and oriented X 3, normal strength and tone. Normal symmetric reflexes. Normal coordination and gait  ECOG PERFORMANCE STATUS: 1 - Symptomatic but completely ambulatory  Blood pressure 140/91, pulse 85, temperature 97.9 F (36.6 C), temperature source Oral, resp. rate 20, height 5\' 5"  (1.651 m), weight 147 lb (66.679 kg).  LABORATORY DATA: Lab Results  Component Value Date   WBC 7.3 07/18/2013   HGB 13.4 07/18/2013   HCT 39.7 07/18/2013   MCV 91.1 07/18/2013   PLT 258 07/18/2013      Chemistry      Component Value Date/Time   NA 137 07/18/2013 1307   NA  138 07/03/2013 1406   K 4.4 07/18/2013 1307   K 4.6 07/03/2013 1406   CL 102 07/03/2013 1406   CO2 25 07/18/2013 1307   CO2 29 07/03/2013 1406   BUN 15.1 07/18/2013 1307   BUN 11 07/03/2013 1406   CREATININE 0.8 07/18/2013 1307   CREATININE 0.7 07/03/2013 1406   CREATININE 0.76 04/11/2013 0928      Component Value Date/Time   CALCIUM 9.5 07/18/2013 1307   CALCIUM 9.9 07/03/2013 1406   ALKPHOS 66 07/18/2013 1307   ALKPHOS 60 07/03/2013 1406   AST 23 07/18/2013 1307   AST 23 07/03/2013 1406   ALT 39 07/18/2013 1307   ALT 33 07/03/2013 1406   BILITOT 0.23 07/18/2013 1307   BILITOT 0.5 07/03/2013 1406       RADIOGRAPHIC STUDIES:  Dg Chest 2 View  06/11/2013   CLINICAL DATA:  Followup left chest surgery  EXAM: CHEST  2 VIEW  COMPARISON:  06/06/2013  FINDINGS: Left chest tube is again identified. No significant pneumothorax is seen. Postsurgical changes are noted in the left hilum with some volume loss. The right lung is clear. The cardiac shadow is within normal limits. No acute bony abnormality is seen.  IMPRESSION: No evidence of pneumothorax.  No acute abnormality is noted.   Electronically Signed   By: Alcide Clever M.D.   On: 06/11/2013 11:14   Dg Cervical Spine Complete  07/02/2013   CLINICAL DATA:  Left-sided neck discomfort without history of trauma. The patient has history of lung malignancy and coronary artery disease  EXAM: CERVICAL SPINE  4+ VIEWS  COMPARISON:  None  FINDINGS: The cervical vertebral bodies are preserved in height. Mild disc space narrowing at the C5-6 and C6-7 is present with small anterior endplate osteophytes. There is mild facet joint degenerative change at multiple levels. The oblique views reveal mild bony encroachment upon the neural foramina bilaterally, especially inferiorly. The prevertebral soft tissue spaces appear normal. The odontoid is intact and the lateral masses of C1 align normally with those of C2. The observed portions of the 1st and 2nd  ribs appear normal.  IMPRESSION: There are mild degenerative disc and facet joint changes within the cervical spine. There is no evidence of a compression fracture.   Electronically Signed   By: David  [place]   On: 07/02/2013 09:26     ASSESSMENT/PLAN: Patient is a very pleasant 72 year old Caucasian male recently diagnosed with stage IIIa (T2 A., N2, M0) non-small cell lung cancer, adenocarcinoma  involving the left upper lobe and mediastinal lymphadenopathy diagnosed in August of 2014. He is status post left upper lobectomy with mediastinal lymph node dissection on 05/29/2013. He is status post 1 cycle of adjuvant chemotherapy with cisplatin 75 mg per meter squared and Alimta 500 mg per meter squared given every 3 weeks. A total of 4 cycles are planned. Patient did have some questions about radiation therapy. He states he is scheduled to see Dr. Mitzi Hansen again on 09/18/2013. He will discuss this adjuvant treatment further with Dr. Arbutus Ped at his next office visit.  He'll return in 2 weeks for a symptom management visit.      Laural Benes, Sumiye Hirth E, PA-C     All questions were answered. The patient knows to call the clinic with any problems, questions or concerns. We can certainly see the patient much sooner if necessary.  Total of 25 minutes was spent in face-to-face consultation the patient, total visit time 35 minutes

## 2013-07-18 NOTE — Patient Instructions (Signed)
Continue weekly labs as scheduled Followup in 2 weeks, prior to the start of her next scheduled cycle of adjuvant chemotherapy

## 2013-07-22 ENCOUNTER — Other Ambulatory Visit (HOSPITAL_BASED_OUTPATIENT_CLINIC_OR_DEPARTMENT_OTHER): Payer: Medicare Other | Admitting: Lab

## 2013-07-22 DIAGNOSIS — C3492 Malignant neoplasm of unspecified part of left bronchus or lung: Secondary | ICD-10-CM

## 2013-07-22 DIAGNOSIS — C3410 Malignant neoplasm of upper lobe, unspecified bronchus or lung: Secondary | ICD-10-CM

## 2013-07-22 LAB — COMPREHENSIVE METABOLIC PANEL (CC13)
ALT: 30 U/L (ref 0–55)
AST: 16 U/L (ref 5–34)
Albumin: 3.4 g/dL — ABNORMAL LOW (ref 3.5–5.0)
Alkaline Phosphatase: 71 U/L (ref 40–150)
Anion Gap: 12 mEq/L — ABNORMAL HIGH (ref 3–11)
BUN: 12.5 mg/dL (ref 7.0–26.0)
CO2: 23 mEq/L (ref 22–29)
Calcium: 9.5 mg/dL (ref 8.4–10.4)
Chloride: 107 mEq/L (ref 98–109)
Creatinine: 0.8 mg/dL (ref 0.7–1.3)
Glucose: 108 mg/dl (ref 70–140)
Potassium: 4.5 mEq/L (ref 3.5–5.1)
Sodium: 142 mEq/L (ref 136–145)
Total Bilirubin: 0.24 mg/dL (ref 0.20–1.20)
Total Protein: 7.1 g/dL (ref 6.4–8.3)

## 2013-07-22 LAB — CBC WITH DIFFERENTIAL/PLATELET
BASO%: 0.4 % (ref 0.0–2.0)
Basophils Absolute: 0 10*3/uL (ref 0.0–0.1)
EOS%: 0.7 % (ref 0.0–7.0)
Eosinophils Absolute: 0.1 10*3/uL (ref 0.0–0.5)
HCT: 38.9 % (ref 38.4–49.9)
HGB: 13 g/dL (ref 13.0–17.1)
LYMPH%: 12.6 % — ABNORMAL LOW (ref 14.0–49.0)
MCH: 30.9 pg (ref 27.2–33.4)
MCHC: 33.5 g/dL (ref 32.0–36.0)
MCV: 92.2 fL (ref 79.3–98.0)
MONO#: 0.7 10*3/uL (ref 0.1–0.9)
MONO%: 6.7 % (ref 0.0–14.0)
NEUT#: 8.3 10*3/uL — ABNORMAL HIGH (ref 1.5–6.5)
NEUT%: 79.6 % — ABNORMAL HIGH (ref 39.0–75.0)
Platelets: 264 10*3/uL (ref 140–400)
RBC: 4.22 10*6/uL (ref 4.20–5.82)
RDW: 14.1 % (ref 11.0–14.6)
WBC: 10.5 10*3/uL — ABNORMAL HIGH (ref 4.0–10.3)
lymph#: 1.3 10*3/uL (ref 0.9–3.3)

## 2013-07-22 LAB — MAGNESIUM (CC13): Magnesium: 2 mg/dl (ref 1.5–2.5)

## 2013-07-25 ENCOUNTER — Other Ambulatory Visit: Payer: Self-pay | Admitting: Physician Assistant

## 2013-07-28 ENCOUNTER — Telehealth: Payer: Self-pay | Admitting: Internal Medicine

## 2013-07-28 NOTE — Telephone Encounter (Signed)
sw pt and advised on 12.9.14 appt...pt ok and awre..will get new sched tomorrow

## 2013-07-29 ENCOUNTER — Ambulatory Visit (HOSPITAL_BASED_OUTPATIENT_CLINIC_OR_DEPARTMENT_OTHER): Payer: Medicare Other | Admitting: Internal Medicine

## 2013-07-29 ENCOUNTER — Encounter: Payer: Self-pay | Admitting: Medical Oncology

## 2013-07-29 ENCOUNTER — Encounter: Payer: Self-pay | Admitting: Internal Medicine

## 2013-07-29 ENCOUNTER — Other Ambulatory Visit: Payer: Medicare Other | Admitting: Lab

## 2013-07-29 ENCOUNTER — Telehealth: Payer: Self-pay | Admitting: *Deleted

## 2013-07-29 ENCOUNTER — Telehealth: Payer: Self-pay | Admitting: Internal Medicine

## 2013-07-29 ENCOUNTER — Encounter: Payer: Self-pay | Admitting: *Deleted

## 2013-07-29 ENCOUNTER — Other Ambulatory Visit (HOSPITAL_BASED_OUTPATIENT_CLINIC_OR_DEPARTMENT_OTHER): Payer: Medicare Other | Admitting: Lab

## 2013-07-29 ENCOUNTER — Ambulatory Visit (HOSPITAL_BASED_OUTPATIENT_CLINIC_OR_DEPARTMENT_OTHER): Payer: Medicare Other

## 2013-07-29 DIAGNOSIS — C3492 Malignant neoplasm of unspecified part of left bronchus or lung: Secondary | ICD-10-CM

## 2013-07-29 DIAGNOSIS — Z5111 Encounter for antineoplastic chemotherapy: Secondary | ICD-10-CM

## 2013-07-29 DIAGNOSIS — R110 Nausea: Secondary | ICD-10-CM

## 2013-07-29 DIAGNOSIS — C3410 Malignant neoplasm of upper lobe, unspecified bronchus or lung: Secondary | ICD-10-CM

## 2013-07-29 LAB — CBC WITH DIFFERENTIAL/PLATELET
BASO%: 0 % (ref 0.0–2.0)
Basophils Absolute: 0 10*3/uL (ref 0.0–0.1)
EOS%: 0 % (ref 0.0–7.0)
Eosinophils Absolute: 0 10*3/uL (ref 0.0–0.5)
HCT: 37.6 % — ABNORMAL LOW (ref 38.4–49.9)
HGB: 12.8 g/dL — ABNORMAL LOW (ref 13.0–17.1)
LYMPH%: 10.9 % — ABNORMAL LOW (ref 14.0–49.0)
MCH: 30.3 pg (ref 27.2–33.4)
MCHC: 34 g/dL (ref 32.0–36.0)
MCV: 88.9 fL (ref 79.3–98.0)
MONO#: 0.5 10*3/uL (ref 0.1–0.9)
MONO%: 6.8 % (ref 0.0–14.0)
NEUT#: 6.6 10*3/uL — ABNORMAL HIGH (ref 1.5–6.5)
NEUT%: 82.3 % — ABNORMAL HIGH (ref 39.0–75.0)
Platelets: 316 10*3/uL (ref 140–400)
RBC: 4.23 10*6/uL (ref 4.20–5.82)
RDW: 14.2 % (ref 11.0–14.6)
WBC: 8 10*3/uL (ref 4.0–10.3)
lymph#: 0.9 10*3/uL (ref 0.9–3.3)
nRBC: 0 % (ref 0–0)

## 2013-07-29 LAB — COMPREHENSIVE METABOLIC PANEL (CC13)
ALT: 26 U/L (ref 0–55)
AST: 17 U/L (ref 5–34)
Albumin: 3.8 g/dL (ref 3.5–5.0)
Alkaline Phosphatase: 69 U/L (ref 40–150)
Anion Gap: 12 mEq/L — ABNORMAL HIGH (ref 3–11)
BUN: 21.4 mg/dL (ref 7.0–26.0)
CO2: 19 mEq/L — ABNORMAL LOW (ref 22–29)
Calcium: 10.2 mg/dL (ref 8.4–10.4)
Chloride: 106 mEq/L (ref 98–109)
Creatinine: 0.8 mg/dL (ref 0.7–1.3)
Glucose: 133 mg/dl (ref 70–140)
Potassium: 4.8 mEq/L (ref 3.5–5.1)
Sodium: 137 mEq/L (ref 136–145)
Total Bilirubin: <0.2 mg/dL (ref 0.20–1.20)
Total Protein: 7.6 g/dL (ref 6.4–8.3)

## 2013-07-29 LAB — MAGNESIUM (CC13): Magnesium: 2.3 mg/dl (ref 1.5–2.5)

## 2013-07-29 MED ORDER — DEXAMETHASONE SODIUM PHOSPHATE 20 MG/5ML IJ SOLN
12.0000 mg | Freq: Once | INTRAMUSCULAR | Status: AC
  Administered 2013-07-29: 12 mg via INTRAVENOUS

## 2013-07-29 MED ORDER — PEMETREXED DISODIUM CHEMO INJECTION 500 MG
500.0000 mg/m2 | Freq: Once | INTRAVENOUS | Status: AC
  Administered 2013-07-29: 850 mg via INTRAVENOUS
  Filled 2013-07-29: qty 34

## 2013-07-29 MED ORDER — SODIUM CHLORIDE 0.9 % IV SOLN
150.0000 mg | Freq: Once | INTRAVENOUS | Status: AC
  Administered 2013-07-29: 150 mg via INTRAVENOUS
  Filled 2013-07-29: qty 5

## 2013-07-29 MED ORDER — SODIUM CHLORIDE 0.9 % IV SOLN
Freq: Once | INTRAVENOUS | Status: AC
  Administered 2013-07-29: 10:00:00 via INTRAVENOUS

## 2013-07-29 MED ORDER — PALONOSETRON HCL INJECTION 0.25 MG/5ML
0.2500 mg | Freq: Once | INTRAVENOUS | Status: AC
  Administered 2013-07-29: 0.25 mg via INTRAVENOUS

## 2013-07-29 MED ORDER — DEXAMETHASONE SODIUM PHOSPHATE 20 MG/5ML IJ SOLN
INTRAMUSCULAR | Status: AC
  Filled 2013-07-29: qty 5

## 2013-07-29 MED ORDER — SODIUM CHLORIDE 0.9 % IV SOLN
75.0000 mg/m2 | Freq: Once | INTRAVENOUS | Status: AC
  Administered 2013-07-29: 130 mg via INTRAVENOUS
  Filled 2013-07-29: qty 130

## 2013-07-29 MED ORDER — PALONOSETRON HCL INJECTION 0.25 MG/5ML
INTRAVENOUS | Status: AC
  Filled 2013-07-29: qty 5

## 2013-07-29 MED ORDER — POTASSIUM CHLORIDE 2 MEQ/ML IV SOLN
Freq: Once | INTRAVENOUS | Status: AC
  Administered 2013-07-29: 10:00:00 via INTRAVENOUS
  Filled 2013-07-29: qty 10

## 2013-07-29 NOTE — Progress Notes (Signed)
[facility]  [phone] Fax:([phone]) [phone] OFFICE PROGRESS NOTE  Marga Melnick, MD [phone] [facility] [facility] [address] [place] [address]  DIAGNOSIS: Stage IIIA (T2a., N2, M0) non-small cell lung cancer adenocarcinoma with negative EGFR mutation and negative ALK gene translocation involving the left upper lobe middle mediastinal lymphadenopathy diagnosed in August of 2014  Lung cancer   Primary site: Lung (Left)   Staging method: AJCC 7th Edition   Clinical: Stage IIIA (T2a, N2, M0) signed by Si Gaul, MD on 06/19/2013  5:06 PM   Summary: Stage IIIA (T2a, N2, M0).  Molecular biomarkers: Positive for NF1E105fs*7, PTPN11 G503V, ATM splice site [id number]>TT, TP53 E271K, SETD2 N133fs*17 and SPOP E47K                                          Negative for: RET, ALK, BRAF, KRAS, ERBB2, MET and EGFR.  PRIOR THERAPY: Status post left upper lobectomy with mediastinal lymph node dissection on 05/29/2013  CURRENT THERAPY: Adjuvant chemotherapy with cisplatin at 75 mg per meter squared and Alimta at 500 mg per meter squared given every 3 weeks for a total of 4 cycles. First cycle given on 07/10/2013  DISEASE STAGE: Lung cancer   Primary site: Lung (Left)   Staging method: AJCC 7th Edition   Clinical: Stage IIIA (T2a, N2, M0) signed by Si Gaul, MD on 06/19/2013  5:06 PM   Summary: Stage IIIA (T2a, N2, M0)  CHEMOTHERAPY INTENT: Control/curative  CURRENT # OF CHEMOTHERAPY CYCLES: 1  CURRENT ANTIEMETICS: Aloxi, dexamethasone, Emend, Compazine  CURRENT SMOKING STATUS: Former smoker  ORAL CHEMOTHERAPY AND CONSENT: n/a  CURRENT BISPHOSPHONATES USE: none  PAIN MANAGEMENT:  NARCOTICS INDUCED CONSTIPATION:   LIVING WILL AND CODE STATUS:    INTERVAL HISTORY: Jordan Mccullough 72 y.o. male returns for a symptom management visit after receiving his first cycle of adjuvant chemotherapy with cisplatin and Alimta. Overall he tolerated the  first cycle of chemotherapy relatively well with the exception of some nausea, increased belching, stomach cramps and fatigue started 4 days after his chemotherapy and improved with his current antiemetics with Compazine. He denied having any significant fever or chills. He denied having any significant chest pain, shortness of breath, cough or hemoptysis. The patient denied having any weight loss or night sweats. He is here today to start the second cycle of his chemotherapy.  MEDICAL HISTORY: Past Medical History  Diagnosis Date  . Hyperlipidemia   . Diverticulosis   . Coronary artery disease     MI 1992, S/P  PTCA; negative stress test in November 2011 with no ischemia.   . Peripheral vascular disease 1/14    4.8x4.6 cm infrarenal abdominal aortic fusiform aneurysm, 1.5 cm right common iliac artery aneurysm  . AAA (abdominal aortic aneurysm)   . Hearing loss     Bilateral   . Myocardial infarction 1992    dr Clifton James   cardiac  md  . Lung cancer 10-14    non-small cell lung cancer    ALLERGIES:  has No Known Allergies.  MEDICATIONS:  Current Outpatient Prescriptions  Medication Sig Dispense Refill  . aspirin EC 81 MG tablet Take 81 mg by mouth daily.      Marland Kitchen dexamethasone (DECADRON) 4 MG tablet Take 4 mg by mouth 2 (two) times daily. 4 mg by mouth 2 times a day, the day before, day of  and day after the chemotherapy.      . folic acid (FOLVITE) 1 MG tablet Take 1 tablet (1 mg total) by mouth daily.  30 tablet  2  . Multiple Vitamin (MULTIVITAMIN) tablet Take 1 tablet by mouth every evening.       . simvastatin (ZOCOR) 40 MG tablet Take 40 mg by mouth every evening.      . nicotine (NICODERM CQ - DOSED IN MG/24 HOURS) 21 mg/24hr patch Place 1 patch onto the skin daily.  28 patch  1  . prochlorperazine (COMPAZINE) 10 MG tablet 10 mg.       No current facility-administered medications for this visit.    SURGICAL HISTORY:  Past Surgical History  Procedure Laterality Date  . Ptca   1992  . Inguinal heriiorrhaphy bilaterally    . Colonoscopy w/ polypectomy  2003     negative 2010,due 2020; Dr Juanda Chance  . Pilonidal cyst excision    . Rotator cuff repair      Bilateral  . Cystoscopy/retrograde/ureteroscopy Bilateral 10/09/2012    Procedure: BILATERAL RETROGRADE bladder and urethral BIOPSY ;  Surgeon: Milford Cage, MD;  Location: [facility];  Service: Urology;  Laterality: Bilateral;  BILATERAL RETROGRADE  AND bladder and urethral BIOPSY    . Prostate biopsy N/A 10/09/2012    Procedure: PROSTATIC URETHRAL BIOPSY;  Surgeon: Milford Cage, MD;  Location: [facility];  Service: Urology;  Laterality: N/A;  PROSTATIC URETHRAL BIOPSY    . Hernia repair Bilateral     Inguinal  . Mediastinoscopy N/A 05/01/2013    Procedure: MEDIASTINOSCOPY;  Surgeon: Alleen Borne, MD;  Location: [facility] OR;  Service: Thoracic;  Laterality: N/A;  . Video bronchoscopy N/A 05/01/2013    Procedure: VIDEO BRONCHOSCOPY;  Surgeon: Alleen Borne, MD;  Location: [facility] OR;  Service: Thoracic;  Laterality: N/A;  . Coronary angioplasty      no stents  . Thorocotomy with lobectomy Left 05/29/2013    Procedure: LEFT THOROCOTOMY WITH LEFT UPPER LOBE LOBECTOMY;  Surgeon: Alleen Borne, MD;  Location: [facility] OR;  Service: Thoracic;  Laterality: Left;    REVIEW OF SYSTEMS:  Constitutional: negative Eyes: negative Ears, nose, mouth, throat, and face: negative Respiratory: negative Cardiovascular: negative Gastrointestinal: negative Genitourinary:negative Integument/breast: negative Hematologic/lymphatic: negative Musculoskeletal:positive for myalgias and Residual soreness from his left upper lobe lobectomy surgery Neurological: positive for paresthesia Behavioral/Psych: negative Endocrine: negative Allergic/Immunologic: negative   PHYSICAL EXAMINATION: General appearance: alert, cooperative, appears stated age and no distress Head: Normocephalic, without obvious abnormality, atraumatic Neck: no  adenopathy, no carotid bruit, no JVD, supple, symmetrical, trachea midline and thyroid not enlarged, symmetric, no tenderness/mass/nodules Lymph nodes: Cervical, supraclavicular, and axillary nodes normal. Resp: clear to auscultation bilaterally Cardio: S1, S2 normal and Occasional missed/skipped beat(s) GI: soft, non-tender; bowel sounds normal; no masses,  no organomegaly Extremities: extremities normal, atraumatic, no cyanosis or edema Neurologic: Alert and oriented X 3, normal strength and tone. Normal symmetric reflexes. Normal coordination and gait  ECOG PERFORMANCE STATUS: 1 - Symptomatic but completely ambulatory  Blood pressure 140/84, pulse 87, temperature 97.6 F (36.4 C), temperature source Oral, resp. rate 18, height 5\' 5"  (1.651 m), weight 151 lb 14.4 oz (68.901 kg), SpO2 99.00%.  LABORATORY DATA: Lab Results  Component Value Date   WBC 8.0 07/29/2013   HGB 12.8* 07/29/2013   HCT 37.6* 07/29/2013   MCV 88.9 07/29/2013   PLT 316 07/29/2013      Chemistry      Component Value Date/Time  NA 142 07/22/2013 0914   NA 138 07/03/2013 1406   K 4.5 07/22/2013 0914   K 4.6 07/03/2013 1406   CL 102 07/03/2013 1406   CO2 23 07/22/2013 0914   CO2 29 07/03/2013 1406   BUN 12.5 07/22/2013 0914   BUN 11 07/03/2013 1406   CREATININE 0.8 07/22/2013 0914   CREATININE 0.7 07/03/2013 1406   CREATININE 0.76 04/11/2013 0928      Component Value Date/Time   CALCIUM 9.5 07/22/2013 0914   CALCIUM 9.9 07/03/2013 1406   ALKPHOS 71 07/22/2013 0914   ALKPHOS 60 07/03/2013 1406   AST 16 07/22/2013 0914   AST 23 07/03/2013 1406   ALT 30 07/22/2013 0914   ALT 33 07/03/2013 1406   BILITOT 0.24 07/22/2013 0914   BILITOT 0.5 07/03/2013 1406       RADIOGRAPHIC STUDIES:   ASSESSMENT/PLAN: Patient is a very pleasant 72 year old Caucasian male recently diagnosed with stage IIIA (T2a., N2, M0) non-small cell lung cancer, adenocarcinoma involving the left upper lobe and mediastinal lymphadenopathy  diagnosed in August of 2014. He is status post left upper lobectomy with mediastinal lymph node dissection on 05/29/2013. He is status post 1 cycle of adjuvant chemotherapy with cisplatin 75 mg per meter squared and Alimta 500 mg per meter squared given every 3 weeks.  The patient is doing fine today. I recommended for him to proceed with the second cycle of his adjuvant chemotherapy as scheduled. For nausea, the patient was advised to take Compazine 10 mg by mouth every 6 hours on a scheduled basis for few days starting from day 4 after the chemotherapy. He would come back for followup visit in 3 weeks with the next cycle of his chemotherapy. He was advised to call immediately if he has any concerning symptoms in the interval. All questions were answered. The patient knows to call the clinic with any problems, questions or concerns. We can certainly see the patient much sooner if necessary.  Total of 25 minutes was spent in face-to-face consultation the patient, total visit time 35 minutes

## 2013-07-29 NOTE — Patient Instructions (Addendum)
[facility] Discharge Instructions for Patients Receiving Chemotherapy  Today you received the following chemotherapy agents: Alimta and Cisplatin.   To help prevent nausea and vomiting after your treatment, we encourage you to take your nausea medication as directed.    If you develop nausea and vomiting that is not controlled by your nausea medication, call the clinic.   BELOW ARE SYMPTOMS THAT SHOULD BE REPORTED IMMEDIATELY:  *FEVER GREATER THAN 100.5 F  *CHILLS WITH OR WITHOUT FEVER  NAUSEA AND VOMITING THAT IS NOT CONTROLLED WITH YOUR NAUSEA MEDICATION  *UNUSUAL SHORTNESS OF BREATH  *UNUSUAL BRUISING OR BLEEDING  TENDERNESS IN MOUTH AND THROAT WITH OR WITHOUT PRESENCE OF ULCERS  *URINARY PROBLEMS  *BOWEL PROBLEMS  UNUSUAL RASH Items with * indicate a potential emergency and should be followed up as soon as possible.  Feel free to call the clinic you have any questions or concerns. The clinic phone number is [phone].

## 2013-07-29 NOTE — Telephone Encounter (Signed)
gave pt md appt , emailed Marcelino Duster regarding chemo and labs will be scheduled later

## 2013-07-29 NOTE — Progress Notes (Signed)
Pt urinated 750 cc at 10:30 am, 850 cc at 11:35 am,  900 cc at 12:40 pm. 650 cc at 1:30 pm.

## 2013-07-29 NOTE — Patient Instructions (Signed)
CURRENT THERAPY: Adjuvant chemotherapy with cisplatin at 75 mg per meter squared and Alimta at 500 mg per meter squared given every 3 weeks for a total of 4 cycles. First cycle given on 07/10/2013  DISEASE STAGE:  Lung cancer  Primary site: Lung (Left)  Staging method: AJCC 7th Edition  Clinical: Stage IIIA (T2a, N2, M0) signed by Si Gaul, MD on 06/19/2013 5:06 PM  Summary: Stage IIIA (T2a, N2, M0)  CHEMOTHERAPY INTENT: Control/curative  CURRENT # OF CHEMOTHERAPY CYCLES: 1  CURRENT ANTIEMETICS: Aloxi, dexamethasone, Emend, Compazine  CURRENT SMOKING STATUS: Former smoker  ORAL CHEMOTHERAPY AND CONSENT: n/a  CURRENT BISPHOSPHONATES USE: none  PAIN MANAGEMENT:  NARCOTICS INDUCED CONSTIPATION:  LIVING WILL AND CODE STATUS:

## 2013-07-29 NOTE — Progress Notes (Signed)
Chaplain made initial visit. Pt said he had been here since 7 a.m but that he was doing okay. He stated that he was [employer] and a member of a [employer]. Pt also said that he was from [place]. Pt's wife arrived and was very friendly, stating that she "loved chaplains." Pt's wife had brought lunch, so they began eating. Pt and wife did not seem to have further needs at this time.

## 2013-07-29 NOTE — Telephone Encounter (Signed)
Per staff message and POF I have scheduled appts.  JMW  

## 2013-07-30 ENCOUNTER — Telehealth: Payer: Self-pay | Admitting: Internal Medicine

## 2013-07-30 NOTE — Telephone Encounter (Signed)
Gave pt appt for lab,md and chemo for december 2014 °

## 2013-08-05 ENCOUNTER — Other Ambulatory Visit (HOSPITAL_BASED_OUTPATIENT_CLINIC_OR_DEPARTMENT_OTHER): Payer: Medicare Other

## 2013-08-05 DIAGNOSIS — C3410 Malignant neoplasm of upper lobe, unspecified bronchus or lung: Secondary | ICD-10-CM

## 2013-08-05 DIAGNOSIS — C3492 Malignant neoplasm of unspecified part of left bronchus or lung: Secondary | ICD-10-CM

## 2013-08-05 LAB — COMPREHENSIVE METABOLIC PANEL (CC13)
ALT: 22 U/L (ref 0–55)
AST: 18 U/L (ref 5–34)
Albumin: 3.7 g/dL (ref 3.5–5.0)
Alkaline Phosphatase: 67 U/L (ref 40–150)
Anion Gap: 7 mEq/L (ref 3–11)
BUN: 16.9 mg/dL (ref 7.0–26.0)
CO2: 26 mEq/L (ref 22–29)
Calcium: 10.1 mg/dL (ref 8.4–10.4)
Chloride: 104 mEq/L (ref 98–109)
Creatinine: 0.8 mg/dL (ref 0.7–1.3)
Glucose: 94 mg/dl (ref 70–140)
Potassium: 5.1 mEq/L (ref 3.5–5.1)
Sodium: 137 mEq/L (ref 136–145)
Total Bilirubin: 0.26 mg/dL (ref 0.20–1.20)
Total Protein: 7.4 g/dL (ref 6.4–8.3)

## 2013-08-05 LAB — CBC WITH DIFFERENTIAL/PLATELET
BASO%: 0.5 % (ref 0.0–2.0)
Basophils Absolute: 0 10*3/uL (ref 0.0–0.1)
EOS%: 1.1 % (ref 0.0–7.0)
Eosinophils Absolute: 0.1 10*3/uL (ref 0.0–0.5)
HCT: 38.5 % (ref 38.4–49.9)
HGB: 13.4 g/dL (ref 13.0–17.1)
LYMPH%: 22.9 % (ref 14.0–49.0)
MCH: 31.3 pg (ref 27.2–33.4)
MCHC: 34.7 g/dL (ref 32.0–36.0)
MCV: 90.2 fL (ref 79.3–98.0)
MONO#: 0.9 10*3/uL (ref 0.1–0.9)
MONO%: 11.6 % (ref 0.0–14.0)
NEUT#: 4.8 10*3/uL (ref 1.5–6.5)
NEUT%: 63.9 % (ref 39.0–75.0)
Platelets: 222 10*3/uL (ref 140–400)
RBC: 4.27 10*6/uL (ref 4.20–5.82)
RDW: 14.1 % (ref 11.0–14.6)
WBC: 7.5 10*3/uL (ref 4.0–10.3)
lymph#: 1.7 10*3/uL (ref 0.9–3.3)

## 2013-08-05 LAB — MAGNESIUM (CC13): Magnesium: 2.1 mg/dl (ref 1.5–2.5)

## 2013-08-10 ENCOUNTER — Encounter: Payer: Self-pay | Admitting: Internal Medicine

## 2013-08-12 ENCOUNTER — Telehealth: Payer: Self-pay | Admitting: *Deleted

## 2013-08-12 ENCOUNTER — Other Ambulatory Visit (HOSPITAL_BASED_OUTPATIENT_CLINIC_OR_DEPARTMENT_OTHER): Payer: Medicare Other

## 2013-08-12 DIAGNOSIS — C3492 Malignant neoplasm of unspecified part of left bronchus or lung: Secondary | ICD-10-CM

## 2013-08-12 DIAGNOSIS — C3410 Malignant neoplasm of upper lobe, unspecified bronchus or lung: Secondary | ICD-10-CM

## 2013-08-12 LAB — CBC WITH DIFFERENTIAL/PLATELET
BASO%: 0.3 % (ref 0.0–2.0)
Basophils Absolute: 0 10*3/uL (ref 0.0–0.1)
EOS%: 1.2 % (ref 0.0–7.0)
Eosinophils Absolute: 0.1 10*3/uL (ref 0.0–0.5)
HCT: 35.4 % — ABNORMAL LOW (ref 38.4–49.9)
HGB: 12 g/dL — ABNORMAL LOW (ref 13.0–17.1)
LYMPH%: 22.1 % (ref 14.0–49.0)
MCH: 30.6 pg (ref 27.2–33.4)
MCHC: 33.8 g/dL (ref 32.0–36.0)
MCV: 90.4 fL (ref 79.3–98.0)
MONO#: 0.7 10*3/uL (ref 0.1–0.9)
MONO%: 10.9 % (ref 0.0–14.0)
NEUT#: 4.5 10*3/uL (ref 1.5–6.5)
NEUT%: 65.5 % (ref 39.0–75.0)
Platelets: 254 10*3/uL (ref 140–400)
RBC: 3.91 10*6/uL — ABNORMAL LOW (ref 4.20–5.82)
RDW: 14.4 % (ref 11.0–14.6)
WBC: 6.8 10*3/uL (ref 4.0–10.3)
lymph#: 1.5 10*3/uL (ref 0.9–3.3)

## 2013-08-12 LAB — COMPREHENSIVE METABOLIC PANEL (CC13)
ALT: 17 U/L (ref 0–55)
AST: 16 U/L (ref 5–34)
Albumin: 3.1 g/dL — ABNORMAL LOW (ref 3.5–5.0)
Alkaline Phosphatase: 74 U/L (ref 40–150)
Anion Gap: 9 mEq/L (ref 3–11)
BUN: 12.6 mg/dL (ref 7.0–26.0)
CO2: 28 mEq/L (ref 22–29)
Calcium: 10 mg/dL (ref 8.4–10.4)
Chloride: 106 mEq/L (ref 98–109)
Creatinine: 0.8 mg/dL (ref 0.7–1.3)
Glucose: 101 mg/dl (ref 70–140)
Potassium: 5.6 mEq/L — ABNORMAL HIGH (ref 3.5–5.1)
Sodium: 143 mEq/L (ref 136–145)
Total Bilirubin: 0.24 mg/dL (ref 0.20–1.20)
Total Protein: 7.5 g/dL (ref 6.4–8.3)

## 2013-08-12 LAB — MAGNESIUM (CC13): Magnesium: 2.2 mg/dl (ref 1.5–2.5)

## 2013-08-12 NOTE — Telephone Encounter (Signed)
Message copied by Sinda Du on Tue Aug 12, 2013  2:09 PM ------      Message from: Jordan Mccullough      Created: Sun Aug 10, 2013  2:04 PM       Please  schedule fasting Labs & follow up OV 2-3 days later : Lipids while still on Simvastatin 40 mg daily. Bring all current medication bottles , BP & glucose readings to that appointment. ------

## 2013-08-12 NOTE — Telephone Encounter (Signed)
Patient needs lab orders. He is coming in tomorrow.

## 2013-08-12 NOTE — Telephone Encounter (Signed)
Left a message to call our office to schedule an appointment for labs & OV thanks

## 2013-08-13 ENCOUNTER — Other Ambulatory Visit (INDEPENDENT_AMBULATORY_CARE_PROVIDER_SITE_OTHER): Payer: Medicare Other

## 2013-08-13 DIAGNOSIS — E785 Hyperlipidemia, unspecified: Secondary | ICD-10-CM

## 2013-08-13 DIAGNOSIS — R972 Elevated prostate specific antigen [PSA]: Secondary | ICD-10-CM

## 2013-08-13 LAB — LIPID PANEL
Cholesterol: 137 mg/dL (ref 0–200)
HDL: 38.1 mg/dL — ABNORMAL LOW (ref >39.00)
LDL Cholesterol: 83 mg/dL (ref 0–99)
Total CHOL/HDL Ratio: 4
Triglycerides: 81 mg/dL (ref 0.0–149.0)
VLDL: 16.2 mg/dL (ref 0.0–40.0)

## 2013-08-13 LAB — PSA: PSA: 13.43 ng/mL — ABNORMAL HIGH (ref 0.10–4.00)

## 2013-08-13 NOTE — Telephone Encounter (Signed)
Pt was seen today and labs was ordered.//AB/CMA

## 2013-08-18 ENCOUNTER — Ambulatory Visit (INDEPENDENT_AMBULATORY_CARE_PROVIDER_SITE_OTHER): Payer: Medicare Other | Admitting: Internal Medicine

## 2013-08-18 ENCOUNTER — Encounter: Payer: Self-pay | Admitting: Internal Medicine

## 2013-08-18 VITALS — BP 147/80 | HR 91 | Temp 98.5°F | Wt 155.4 lb

## 2013-08-18 DIAGNOSIS — I252 Old myocardial infarction: Secondary | ICD-10-CM

## 2013-08-18 DIAGNOSIS — R972 Elevated prostate specific antigen [PSA]: Secondary | ICD-10-CM

## 2013-08-18 DIAGNOSIS — E785 Hyperlipidemia, unspecified: Secondary | ICD-10-CM

## 2013-08-18 MED ORDER — ROSUVASTATIN CALCIUM 40 MG PO TABS: 40.0000 mg | ORAL_TABLET | Freq: Every day | ORAL | Status: DC

## 2013-08-18 NOTE — Progress Notes (Signed)
   Subjective:    Patient ID: Jordan Mccullough, male    DOB: [birth year], 72 y.o.   MRN: [id number]  HPI A heart healthy diet is followed; exercise encompasses 30-60 minutes 2  times per week as  treadmill without symptoms.  Family history is posative for premature coronary disease. His MI was @53 .Brother had MI @ 5. CAD (PMH MI/PTCA) mandates  LDL goal of less than 70 .LDL 83 on Simvastatin 40 mg. There is medication compliance with the statin.  Low dose ASA taken.  Dr. Margarita Grizzle he is following his elevated PSA. He had biopsies of the prostate and bladder in February of this year. All were negative; some inflammatory changes were noted. His PSA on 08/13/13 was 13.43, up from a value of 6.47 one year ago.    Review of Systems Specifically denied are  chest pain, palpitations, dyspnea, or claudication.  Significant abdominal symptoms (except with chemo every 3 weeks), memory deficit, or myalgias not present.  He denies dysuria, hematuria, or pyuria.      Objective:   Physical Exam Appears healthy and well-nourished & in no acute distress  No carotid bruits are present.No neck pain distention present at 10 - 15 degrees. Thyroid normal to palpation  Hearing aids bilaterally  Heart rhythm and rate are normal with no significant murmurs or gallops.  Chest is clear with no increased work of breathing. Op scars L lateral thorax  There is no evidence of aortic aneurysm(despite PMH) or renal artery bruits  Abdomen soft with no organomegaly or masses. No HJR  No clubbing, cyanosis or edema present.  Pedal pulses are intact   No ischemic skin changes are present . Fingernails healthy    Alert and oriented. Strength, tone, DTRs reflexes normal          Assessment & Plan:  See Current Assessment & Plan in Problem List under specific Diagnosis

## 2013-08-18 NOTE — Assessment & Plan Note (Addendum)
Urology reassessment as PSA  Has risen to 13.43

## 2013-08-18 NOTE — Progress Notes (Signed)
Pre visit review using our clinic review tool, if applicable. No additional management support is needed unless otherwise documented below in the visit note. 

## 2013-08-18 NOTE — Assessment & Plan Note (Addendum)
LDL goal of < 70 MANDATES  Crestor 40 mg daily

## 2013-08-18 NOTE — Patient Instructions (Addendum)
Share PSA results with Dr Margarita Grizzle

## 2013-08-19 ENCOUNTER — Encounter: Payer: Self-pay | Admitting: Physician Assistant

## 2013-08-19 ENCOUNTER — Telehealth: Payer: Self-pay | Admitting: Internal Medicine

## 2013-08-19 ENCOUNTER — Other Ambulatory Visit (HOSPITAL_BASED_OUTPATIENT_CLINIC_OR_DEPARTMENT_OTHER): Payer: Medicare Other

## 2013-08-19 ENCOUNTER — Ambulatory Visit (HOSPITAL_BASED_OUTPATIENT_CLINIC_OR_DEPARTMENT_OTHER): Payer: Medicare Other | Admitting: Physician Assistant

## 2013-08-19 ENCOUNTER — Ambulatory Visit (HOSPITAL_BASED_OUTPATIENT_CLINIC_OR_DEPARTMENT_OTHER): Payer: Medicare Other

## 2013-08-19 ENCOUNTER — Encounter: Payer: Self-pay | Admitting: Internal Medicine

## 2013-08-19 ENCOUNTER — Other Ambulatory Visit: Payer: Medicare Other | Admitting: Lab

## 2013-08-19 DIAGNOSIS — C3410 Malignant neoplasm of upper lobe, unspecified bronchus or lung: Secondary | ICD-10-CM

## 2013-08-19 DIAGNOSIS — Z5111 Encounter for antineoplastic chemotherapy: Secondary | ICD-10-CM

## 2013-08-19 DIAGNOSIS — R5381 Other malaise: Secondary | ICD-10-CM

## 2013-08-19 DIAGNOSIS — C771 Secondary and unspecified malignant neoplasm of intrathoracic lymph nodes: Secondary | ICD-10-CM

## 2013-08-19 DIAGNOSIS — R110 Nausea: Secondary | ICD-10-CM

## 2013-08-19 DIAGNOSIS — C3492 Malignant neoplasm of unspecified part of left bronchus or lung: Secondary | ICD-10-CM

## 2013-08-19 LAB — COMPREHENSIVE METABOLIC PANEL (CC13)
ALT: 19 U/L (ref 0–55)
AST: 11 U/L (ref 5–34)
Albumin: 3.7 g/dL (ref 3.5–5.0)
Alkaline Phosphatase: 62 U/L (ref 40–150)
Anion Gap: 13 mEq/L — ABNORMAL HIGH (ref 3–11)
BUN: 15.8 mg/dL (ref 7.0–26.0)
CO2: 20 mEq/L — ABNORMAL LOW (ref 22–29)
Calcium: 10 mg/dL (ref 8.4–10.4)
Chloride: 107 mEq/L (ref 98–109)
Creatinine: 0.7 mg/dL (ref 0.7–1.3)
Glucose: 121 mg/dl (ref 70–140)
Potassium: 4.3 mEq/L (ref 3.5–5.1)
Sodium: 139 mEq/L (ref 136–145)
Total Bilirubin: <0.2 mg/dL (ref 0.20–1.20)
Total Protein: 7.8 g/dL (ref 6.4–8.3)

## 2013-08-19 LAB — CBC WITH DIFFERENTIAL/PLATELET
BASO%: 0.1 % (ref 0.0–2.0)
Basophils Absolute: 0 10*3/uL (ref 0.0–0.1)
EOS%: 0 % (ref 0.0–7.0)
Eosinophils Absolute: 0 10*3/uL (ref 0.0–0.5)
HCT: 34.3 % — ABNORMAL LOW (ref 38.4–49.9)
HGB: 11.8 g/dL — ABNORMAL LOW (ref 13.0–17.1)
LYMPH%: 9.7 % — ABNORMAL LOW (ref 14.0–49.0)
MCH: 30.2 pg (ref 27.2–33.4)
MCHC: 34.4 g/dL (ref 32.0–36.0)
MCV: 87.7 fL (ref 79.3–98.0)
MONO#: 0.6 10*3/uL (ref 0.1–0.9)
MONO%: 6.1 % (ref 0.0–14.0)
NEUT#: 8.2 10*3/uL — ABNORMAL HIGH (ref 1.5–6.5)
NEUT%: 84.1 % — ABNORMAL HIGH (ref 39.0–75.0)
Platelets: 479 10*3/uL — ABNORMAL HIGH (ref 140–400)
RBC: 3.91 10*6/uL — ABNORMAL LOW (ref 4.20–5.82)
RDW: 14.6 % (ref 11.0–14.6)
WBC: 9.7 10*3/uL (ref 4.0–10.3)
lymph#: 0.9 10*3/uL (ref 0.9–3.3)

## 2013-08-19 LAB — MAGNESIUM (CC13): Magnesium: 2.2 mg/dl (ref 1.5–2.5)

## 2013-08-19 MED ORDER — SODIUM CHLORIDE 0.9 % IV SOLN
500.0000 mg/m2 | Freq: Once | INTRAVENOUS | Status: AC
  Administered 2013-08-19: 850 mg via INTRAVENOUS
  Filled 2013-08-19: qty 34

## 2013-08-19 MED ORDER — PALONOSETRON HCL INJECTION 0.25 MG/5ML
0.2500 mg | Freq: Once | INTRAVENOUS | Status: AC
  Administered 2013-08-19: 0.25 mg via INTRAVENOUS

## 2013-08-19 MED ORDER — SODIUM CHLORIDE 0.9 % IV SOLN
75.0000 mg/m2 | Freq: Once | INTRAVENOUS | Status: AC
  Administered 2013-08-19: 130 mg via INTRAVENOUS
  Filled 2013-08-19: qty 130

## 2013-08-19 MED ORDER — POTASSIUM CHLORIDE 2 MEQ/ML IV SOLN
Freq: Once | INTRAVENOUS | Status: AC
  Administered 2013-08-19: 10:00:00 via INTRAVENOUS
  Filled 2013-08-19: qty 10

## 2013-08-19 MED ORDER — DEXAMETHASONE SODIUM PHOSPHATE 20 MG/5ML IJ SOLN
INTRAMUSCULAR | Status: AC
  Filled 2013-08-19: qty 5

## 2013-08-19 MED ORDER — PALONOSETRON HCL INJECTION 0.25 MG/5ML
INTRAVENOUS | Status: AC
  Filled 2013-08-19: qty 5

## 2013-08-19 MED ORDER — CYANOCOBALAMIN 1000 MCG/ML IJ SOLN
INTRAMUSCULAR | Status: AC
  Filled 2013-08-19: qty 1

## 2013-08-19 MED ORDER — SODIUM CHLORIDE 0.9 % IV SOLN
Freq: Once | INTRAVENOUS | Status: AC
  Administered 2013-08-19: 10:00:00 via INTRAVENOUS

## 2013-08-19 MED ORDER — CYANOCOBALAMIN 1000 MCG/ML IJ SOLN
1000.0000 ug | Freq: Once | INTRAMUSCULAR | Status: AC
  Administered 2013-08-19: 1000 ug via INTRAMUSCULAR

## 2013-08-19 MED ORDER — FOSAPREPITANT DIMEGLUMINE INJECTION 150 MG
150.0000 mg | Freq: Once | INTRAVENOUS | Status: AC
  Administered 2013-08-19: 150 mg via INTRAVENOUS
  Filled 2013-08-19: qty 5

## 2013-08-19 MED ORDER — DEXAMETHASONE SODIUM PHOSPHATE 20 MG/5ML IJ SOLN
12.0000 mg | Freq: Once | INTRAMUSCULAR | Status: AC
  Administered 2013-08-19: 12 mg via INTRAVENOUS

## 2013-08-19 NOTE — Telephone Encounter (Signed)
gv and printed appt sched and avs for pt for Jan 2015 °

## 2013-08-19 NOTE — Patient Instructions (Signed)
[facility] Discharge Instructions for Patients Receiving Chemotherapy  Today you received the following chemotherapy agents: Alimta and Cisplatin  To help prevent nausea and vomiting after your treatment, we encourage you to take your nausea medication as needed.   If you develop nausea and vomiting that is not controlled by your nausea medication, call the clinic.   BELOW ARE SYMPTOMS THAT SHOULD BE REPORTED IMMEDIATELY:  *FEVER GREATER THAN 100.5 F  *CHILLS WITH OR WITHOUT FEVER  NAUSEA AND VOMITING THAT IS NOT CONTROLLED WITH YOUR NAUSEA MEDICATION  *UNUSUAL SHORTNESS OF BREATH  *UNUSUAL BRUISING OR BLEEDING  TENDERNESS IN MOUTH AND THROAT WITH OR WITHOUT PRESENCE OF ULCERS  *URINARY PROBLEMS  *BOWEL PROBLEMS  UNUSUAL RASH Items with * indicate a potential emergency and should be followed up as soon as possible.  Feel free to call the clinic you have any questions or concerns. The clinic phone number is [phone].

## 2013-08-19 NOTE — Progress Notes (Addendum)
[facility]  Telephone:(336) [phone] Fax:(336) [phone] SHARED VISIT PROGRESS NOTE  Marga Melnick, MD [phone] [facility] [address] [place] [address]  DIAGNOSIS: Stage IIIA (T2a., N2, M0) non-small cell lung cancer adenocarcinoma with negative EGFR mutation and negative ALK gene translocation involving the left upper lobe middle mediastinal lymphadenopathy diagnosed in August of 2014  Lung cancer   Primary site: Lung (Left)   Staging method: AJCC 7th Edition   Clinical: Stage IIIA (T2a, N2, M0) signed by Si Gaul, MD on 06/19/2013  5:06 PM   Summary: Stage IIIA (T2a, N2, M0).  Molecular biomarkers: Positive for NF1E1033fs*7, PTPN11 G503V, ATM splice site [id number]>TT, TP53 E271K, SETD2 N146fs*17 and SPOP E47K                                          Negative for: RET, ALK, BRAF, KRAS, ERBB2, MET and EGFR.  PRIOR THERAPY: Status post left upper lobectomy with mediastinal lymph node dissection on 05/29/2013  CURRENT THERAPY: Adjuvant chemotherapy with cisplatin at 75 mg per meter squared and Alimta at 500 mg per meter squared given every 3 weeks for a total of 4 cycles. First cycle given on 07/10/2013. Status post 2 cycles.  DISEASE STAGE: Lung cancer   Primary site: Lung (Left)   Staging method: AJCC 7th Edition   Clinical: Stage IIIA (T2a, N2, M0) signed by Si Gaul, MD on 06/19/2013  5:06 PM   Summary: Stage IIIA (T2a, N2, M0)  CHEMOTHERAPY INTENT: Control/curative  CURRENT # OF CHEMOTHERAPY CYCLES: 3  CURRENT ANTIEMETICS: Aloxi, dexamethasone, Emend, Compazine  CURRENT SMOKING STATUS: Former smoker  ORAL CHEMOTHERAPY AND CONSENT: n/a  CURRENT BISPHOSPHONATES USE: none  PAIN MANAGEMENT:  NARCOTICS INDUCED CONSTIPATION:   LIVING WILL AND CODE STATUS:    INTERVAL HISTORY: Jordan Mccullough 72 y.o. male returns for a symptom management visit. He is status post 2 cycles of adjuvant chemotherapy with cisplatin and Alimta.  Overall he is tolerating the chemotherapy relatively well with the exception of increased gas and cramping in the abdomen. It is somewhat relieved by either belching or passing gas. The patient states that his primary care physician, Dr. Alwyn Ren, recently changed his cholesterol medication to Crestor as his "bad lipids" were not in a good range. He complains of a headache. It is described as a generalized headache occurring when he awakes in the morning and bearing intensity throughout the day. Denies any blurred or double vision, no nausea or vomiting associated with headache. He also complains that his vocal cords still are not quite back to his baseline. His voice is still somewhat hoarse and he has to sometimes take a break if he's speaking for any length of time as his voice will fade out. He also complains of some generalized fatigue. On the laboratory data obtained by his primary care physician his PSA has increased from a little over 6 to now 13.43. He is known to Dr. Margarita Grizzle, the urologist. Patient tells me that he had biopsies of his prostate and his bladder under the care of Dr. Margarita Grizzle in February of 2014 and all biopsies were benign. Patient plans to schedule followup appointment with Dr. Margarita Grizzle to followup on his recently elevated PSA level. He also complains of some generalized fatigue.  He denied having any significant fever or chills. He denied having any significant chest pain, shortness of breath, cough or  hemoptysis. The patient denied having any weight loss or night sweats. He is here today to start the third cycle of his chemotherapy.  MEDICAL HISTORY: Past Medical History  Diagnosis Date  . Hyperlipidemia   . Diverticulosis   . Coronary artery disease     MI 1992, S/P  PTCA; negative stress test in November 2011 with no ischemia.   . Peripheral vascular disease 1/14    4.8x4.6 cm infrarenal abdominal aortic fusiform aneurysm, 1.5 cm right common iliac artery aneurysm  . AAA  (abdominal aortic aneurysm)   . Hearing loss     Bilateral   . Myocardial infarction 1992    Dr Clifton James    . Lung cancer 10-14    non-small cell lung cancer    ALLERGIES:  has No Known Allergies.  MEDICATIONS:  Current Outpatient Prescriptions  Medication Sig Dispense Refill  . aspirin EC 81 MG tablet Take 81 mg by mouth daily.      Marland Kitchen dexamethasone (DECADRON) 4 MG tablet Take 4 mg by mouth 2 (two) times daily. 4 mg by mouth 2 times a day, the day before, day of and day after the chemotherapy.      . folic acid (FOLVITE) 1 MG tablet Take 1 tablet (1 mg total) by mouth daily.  30 tablet  2  . Glucosamine-Chondroit-Vit C-Mn (GLUCOSAMINE 1500 COMPLEX PO) Take by mouth.      . Multiple Vitamin (MULTIVITAMIN) tablet Take 1 tablet by mouth every evening.       . prochlorperazine (COMPAZINE) 10 MG tablet 10 mg.      . rosuvastatin (CRESTOR) 40 MG tablet Take 1 tablet (40 mg total) by mouth daily.  30 tablet  2   No current facility-administered medications for this visit.    SURGICAL HISTORY:  Past Surgical History  Procedure Laterality Date  . Ptca  1992  . Inguinal heriiorrhaphy bilaterally    . Colonoscopy w/ polypectomy  2003     negative 2010,due 2020; Dr Juanda Chance  . Pilonidal cyst excision    . Rotator cuff repair      Bilateral  . Cystoscopy/retrograde/ureteroscopy Bilateral 10/09/2012    Procedure: BILATERAL RETROGRADE bladder and urethral BIOPSY ;  Surgeon: Milford Cage, MD;  Location: [facility];  Service: Urology;  Laterality: Bilateral;  BILATERAL RETROGRADE  AND bladder and urethral BIOPSY    . Prostate biopsy N/A 10/09/2012    Procedure: PROSTATIC URETHRAL BIOPSY;  Surgeon: Milford Cage, MD;  Location: [facility];  Service: Urology;  Laterality: N/A;  PROSTATIC URETHRAL BIOPSY    . Hernia repair Bilateral     Inguinal  . Mediastinoscopy N/A 05/01/2013    Procedure: MEDIASTINOSCOPY;  Surgeon: Alleen Borne, MD;  Location: [facility] OR;  Service: Thoracic;   Laterality: N/A;  . Video bronchoscopy N/A 05/01/2013    Procedure: VIDEO BRONCHOSCOPY;  Surgeon: Alleen Borne, MD;  Location: [facility] OR;  Service: Thoracic;  Laterality: N/A;  . Coronary angioplasty      no stents  . Thorocotomy with lobectomy Left 05/29/2013    Procedure: LEFT THOROCOTOMY WITH LEFT UPPER LOBE LOBECTOMY;  Surgeon: Alleen Borne, MD;  Location: [facility] OR;  Service: Thoracic;  Laterality: Left;    REVIEW OF SYSTEMS:  Constitutional: positive for fatigue Eyes: negative Ears, nose, mouth, throat, and face: positive for hoarseness Respiratory: negative Cardiovascular: negative Gastrointestinal: positive for increased gas, belching and flatulence after chemotherapy Genitourinary:negative Integument/breast: negative Hematologic/lymphatic: negative Musculoskeletal:positive for myalgias and Residual soreness from his left upper  lobe lobectomy surgery Neurological: positive for headaches and paresthesia Behavioral/Psych: negative Endocrine: negative Allergic/Immunologic: negative   PHYSICAL EXAMINATION: General appearance: alert, cooperative, appears stated age and no distress Head: Normocephalic, without obvious abnormality, atraumatic Neck: no adenopathy, no carotid bruit, no JVD, supple, symmetrical, trachea midline and thyroid not enlarged, symmetric, no tenderness/mass/nodules Lymph nodes: Cervical, supraclavicular, and axillary nodes normal. Resp: clear to auscultation bilaterally Cardio: S1, S2 normal and Occasional missed/skipped beat(s) GI: soft, non-tender; bowel sounds normal; no masses,  no organomegaly Extremities: extremities normal, atraumatic, no cyanosis or edema Neurologic: Alert and oriented X 3, normal strength and tone. Normal symmetric reflexes. Normal coordination and gait  ECOG PERFORMANCE STATUS: 1 - Symptomatic but completely ambulatory  Blood pressure 159/91, pulse 84, temperature 97.3 F (36.3 C), temperature source Oral, resp. rate 18, height 5\' 5"   (1.651 m), weight 155 lb 14.4 oz (70.716 kg).  LABORATORY DATA: Lab Results  Component Value Date   WBC 9.7 08/19/2013   HGB 11.8* 08/19/2013   HCT 34.3* 08/19/2013   MCV 87.7 08/19/2013   PLT 479* 08/19/2013      Chemistry      Component Value Date/Time   NA 143 08/12/2013 1106   NA 138 07/03/2013 1406   K 5.6* 08/12/2013 1106   K 4.6 07/03/2013 1406   CL 102 07/03/2013 1406   CO2 28 08/12/2013 1106   CO2 29 07/03/2013 1406   BUN 12.6 08/12/2013 1106   BUN 11 07/03/2013 1406   CREATININE 0.8 08/12/2013 1106   CREATININE 0.7 07/03/2013 1406   CREATININE 0.76 04/11/2013 0928      Component Value Date/Time   CALCIUM 10.0 08/12/2013 1106   CALCIUM 9.9 07/03/2013 1406   ALKPHOS 74 08/12/2013 1106   ALKPHOS 60 07/03/2013 1406   AST 16 08/12/2013 1106   AST 23 07/03/2013 1406   ALT 17 08/12/2013 1106   ALT 33 07/03/2013 1406   BILITOT 0.24 08/12/2013 1106   BILITOT 0.5 07/03/2013 1406       RADIOGRAPHIC STUDIES:   ASSESSMENT/PLAN: Patient is a very pleasant 72 year old Caucasian male recently diagnosed with stage IIIA (T2a., N2, M0) non-small cell lung cancer, adenocarcinoma involving the left upper lobe and mediastinal lymphadenopathy diagnosed in August of 2014. He is status post left upper lobectomy with mediastinal lymph node dissection on 05/29/2013. He is status post 2 cycles of adjuvant chemotherapy with cisplatin 75 mg per meter squared and Alimta 500 mg per meter squared given every 3 weeks. Overall he is tolerating his adjuvant chemotherapy without difficulty. Patient was discussed with an also seen by Dr. Arbutus Ped. Patient was advised to followup with his urologist, Dr. Margarita Grizzle regarding his elevated PSA. He will continue with weekly labs as scheduled and return in 3 weeks for cycle #4 of his adjuvant chemotherapy with cisplatin and Alimta. Patient was advised to increase his fluid intake specifically water. We will continue to monitor his voice hoarseness and may  refer him to ENT specialist after he completes his adjuvant chemotherapy if he is still having difficulty. He was advised to call immediately if he has any concerning symptoms in the interval. All questions were answered. The patient knows to call the clinic with any problems, questions or concerns. We can certainly see the patient much sooner if necessary.  Conni Slipper PA-C  ADDENDUM:  Hematology/Oncology Attending:  I had the face-to-face encounter with the patient. I recommended his care plan. This is a very pleasant 72 years old white male with stage IIIA non-small  cell lung cancer status post left upper lobectomy with mediastinal lymph node dissection and he is currently undergoing adjuvant chemotherapy with cisplatin and Alimta status post 2 cycles. He is tolerating his treatment fairly well with no significant complaints except for mild fatigue and occasional nausea. He was recently found to have elevation of PSA and he is currently followed by his urologist I recommended for the patient to proceed with cycle #3 today as scheduled.  He would come back for follow up visit in 3 weeks with the start of cycle #4 of his chemotherapy. He was advised to call immediately if he has any concerning symptoms in the interval. Lajuana Matte., MD 08/21/2013

## 2013-08-19 NOTE — Patient Instructions (Signed)
Continue weekly labs as scheduled Followup in 3 weeks prior to cycle #4 of her adjuvant chemotherapy Followup with Dr. Margarita Grizzle regarding your increased PSA level

## 2013-08-26 ENCOUNTER — Other Ambulatory Visit (HOSPITAL_BASED_OUTPATIENT_CLINIC_OR_DEPARTMENT_OTHER): Payer: Medicare Other

## 2013-08-26 DIAGNOSIS — C3410 Malignant neoplasm of upper lobe, unspecified bronchus or lung: Secondary | ICD-10-CM

## 2013-08-26 DIAGNOSIS — C3492 Malignant neoplasm of unspecified part of left bronchus or lung: Secondary | ICD-10-CM

## 2013-08-26 LAB — COMPREHENSIVE METABOLIC PANEL (CC13)
ALT: 29 U/L (ref 0–55)
AST: 18 U/L (ref 5–34)
Albumin: 3.3 g/dL — ABNORMAL LOW (ref 3.5–5.0)
Alkaline Phosphatase: 64 U/L (ref 40–150)
Anion Gap: 7 mEq/L (ref 3–11)
BUN: 20.5 mg/dL (ref 7.0–26.0)
CO2: 28 mEq/L (ref 22–29)
Calcium: 9.5 mg/dL (ref 8.4–10.4)
Chloride: 105 mEq/L (ref 98–109)
Creatinine: 0.7 mg/dL (ref 0.7–1.3)
Glucose: 116 mg/dl (ref 70–140)
Potassium: 4.6 mEq/L (ref 3.5–5.1)
Sodium: 140 mEq/L (ref 136–145)
Total Bilirubin: 0.29 mg/dL (ref 0.20–1.20)
Total Protein: 6.9 g/dL (ref 6.4–8.3)

## 2013-08-26 LAB — CBC WITH DIFFERENTIAL/PLATELET
BASO%: 0.5 % (ref 0.0–2.0)
Basophils Absolute: 0 10*3/uL (ref 0.0–0.1)
EOS%: 0.9 % (ref 0.0–7.0)
Eosinophils Absolute: 0.1 10*3/uL (ref 0.0–0.5)
HCT: 34.6 % — ABNORMAL LOW (ref 38.4–49.9)
HGB: 12 g/dL — ABNORMAL LOW (ref 13.0–17.1)
LYMPH%: 24.4 % (ref 14.0–49.0)
MCH: 31 pg (ref 27.2–33.4)
MCHC: 34.7 g/dL (ref 32.0–36.0)
MCV: 89.5 fL (ref 79.3–98.0)
MONO#: 0.8 10*3/uL (ref 0.1–0.9)
MONO%: 12.1 % (ref 0.0–14.0)
NEUT#: 4.3 10*3/uL (ref 1.5–6.5)
NEUT%: 62.1 % (ref 39.0–75.0)
Platelets: 222 10*3/uL (ref 140–400)
RBC: 3.86 10*6/uL — ABNORMAL LOW (ref 4.20–5.82)
RDW: 14.4 % (ref 11.0–14.6)
WBC: 6.9 10*3/uL (ref 4.0–10.3)
lymph#: 1.7 10*3/uL (ref 0.9–3.3)

## 2013-08-26 LAB — MAGNESIUM (CC13): Magnesium: 2 mg/dl (ref 1.5–2.5)

## 2013-08-27 ENCOUNTER — Other Ambulatory Visit: Payer: Self-pay | Admitting: Surgery

## 2013-08-27 ENCOUNTER — Encounter: Payer: Self-pay | Admitting: Internal Medicine

## 2013-08-27 DIAGNOSIS — I714 Abdominal aortic aneurysm, without rupture, unspecified: Secondary | ICD-10-CM

## 2013-09-02 ENCOUNTER — Other Ambulatory Visit (HOSPITAL_BASED_OUTPATIENT_CLINIC_OR_DEPARTMENT_OTHER): Payer: Medicare Other

## 2013-09-02 DIAGNOSIS — C3492 Malignant neoplasm of unspecified part of left bronchus or lung: Secondary | ICD-10-CM

## 2013-09-02 DIAGNOSIS — C3410 Malignant neoplasm of upper lobe, unspecified bronchus or lung: Secondary | ICD-10-CM

## 2013-09-02 LAB — COMPREHENSIVE METABOLIC PANEL (CC13)
ALT: 24 U/L (ref 0–55)
AST: 20 U/L (ref 5–34)
Albumin: 3.6 g/dL (ref 3.5–5.0)
Alkaline Phosphatase: 73 U/L (ref 40–150)
Anion Gap: 11 mEq/L (ref 3–11)
BUN: 11.4 mg/dL (ref 7.0–26.0)
CO2: 25 mEq/L (ref 22–29)
Calcium: 9.9 mg/dL (ref 8.4–10.4)
Chloride: 104 mEq/L (ref 98–109)
Creatinine: 0.8 mg/dL (ref 0.7–1.3)
Glucose: 110 mg/dl (ref 70–140)
Potassium: 5.4 mEq/L — ABNORMAL HIGH (ref 3.5–5.1)
Sodium: 140 mEq/L (ref 136–145)
Total Bilirubin: 0.31 mg/dL (ref 0.20–1.20)
Total Protein: 7.2 g/dL (ref 6.4–8.3)

## 2013-09-02 LAB — CBC WITH DIFFERENTIAL/PLATELET
BASO%: 0.3 % (ref 0.0–2.0)
Basophils Absolute: 0 10*3/uL (ref 0.0–0.1)
EOS%: 0.9 % (ref 0.0–7.0)
Eosinophils Absolute: 0.1 10*3/uL (ref 0.0–0.5)
HCT: 34.1 % — ABNORMAL LOW (ref 38.4–49.9)
HGB: 11.7 g/dL — ABNORMAL LOW (ref 13.0–17.1)
LYMPH%: 16.8 % (ref 14.0–49.0)
MCH: 31 pg (ref 27.2–33.4)
MCHC: 34.4 g/dL (ref 32.0–36.0)
MCV: 90.2 fL (ref 79.3–98.0)
MONO#: 0.8 10*3/uL (ref 0.1–0.9)
MONO%: 8.7 % (ref 0.0–14.0)
NEUT#: 6.5 10*3/uL (ref 1.5–6.5)
NEUT%: 73.3 % (ref 39.0–75.0)
Platelets: 196 10*3/uL (ref 140–400)
RBC: 3.78 10*6/uL — ABNORMAL LOW (ref 4.20–5.82)
RDW: 14.9 % — ABNORMAL HIGH (ref 11.0–14.6)
WBC: 8.8 10*3/uL (ref 4.0–10.3)
lymph#: 1.5 10*3/uL (ref 0.9–3.3)

## 2013-09-02 LAB — MAGNESIUM (CC13): Magnesium: 2 mg/dl (ref 1.5–2.5)

## 2013-09-09 ENCOUNTER — Ambulatory Visit (HOSPITAL_BASED_OUTPATIENT_CLINIC_OR_DEPARTMENT_OTHER): Payer: Medicare Other | Admitting: Internal Medicine

## 2013-09-09 ENCOUNTER — Encounter: Payer: Self-pay | Admitting: Internal Medicine

## 2013-09-09 ENCOUNTER — Telehealth: Payer: Self-pay | Admitting: Internal Medicine

## 2013-09-09 ENCOUNTER — Ambulatory Visit (HOSPITAL_BASED_OUTPATIENT_CLINIC_OR_DEPARTMENT_OTHER): Payer: Medicare Other

## 2013-09-09 ENCOUNTER — Other Ambulatory Visit (HOSPITAL_BASED_OUTPATIENT_CLINIC_OR_DEPARTMENT_OTHER): Payer: Medicare Other

## 2013-09-09 VITALS — BP 145/96 | HR 91 | Temp 97.6°F | Resp 18 | Ht 65.0 in | Wt 157.2 lb

## 2013-09-09 DIAGNOSIS — C3490 Malignant neoplasm of unspecified part of unspecified bronchus or lung: Secondary | ICD-10-CM

## 2013-09-09 DIAGNOSIS — C3410 Malignant neoplasm of upper lobe, unspecified bronchus or lung: Secondary | ICD-10-CM

## 2013-09-09 DIAGNOSIS — C3492 Malignant neoplasm of unspecified part of left bronchus or lung: Secondary | ICD-10-CM

## 2013-09-09 DIAGNOSIS — Z5111 Encounter for antineoplastic chemotherapy: Secondary | ICD-10-CM

## 2013-09-09 DIAGNOSIS — C771 Secondary and unspecified malignant neoplasm of intrathoracic lymph nodes: Secondary | ICD-10-CM

## 2013-09-09 DIAGNOSIS — Z87891 Personal history of nicotine dependence: Secondary | ICD-10-CM

## 2013-09-09 LAB — CBC WITH DIFFERENTIAL/PLATELET
BASO%: 0 % (ref 0.0–2.0)
Basophils Absolute: 0 10*3/uL (ref 0.0–0.1)
EOS%: 0 % (ref 0.0–7.0)
Eosinophils Absolute: 0 10*3/uL (ref 0.0–0.5)
HCT: 35.1 % — ABNORMAL LOW (ref 38.4–49.9)
HGB: 12.1 g/dL — ABNORMAL LOW (ref 13.0–17.1)
LYMPH%: 11.5 % — ABNORMAL LOW (ref 14.0–49.0)
MCH: 30.4 pg (ref 27.2–33.4)
MCHC: 34.5 g/dL (ref 32.0–36.0)
MCV: 88.2 fL (ref 79.3–98.0)
MONO#: 0.6 10*3/uL (ref 0.1–0.9)
MONO%: 9.4 % (ref 0.0–14.0)
NEUT#: 5.2 10*3/uL (ref 1.5–6.5)
NEUT%: 79.1 % — ABNORMAL HIGH (ref 39.0–75.0)
Platelets: 367 10*3/uL (ref 140–400)
RBC: 3.98 10*6/uL — ABNORMAL LOW (ref 4.20–5.82)
RDW: 15.8 % — ABNORMAL HIGH (ref 11.0–14.6)
WBC: 6.6 10*3/uL (ref 4.0–10.3)
lymph#: 0.8 10*3/uL — ABNORMAL LOW (ref 0.9–3.3)
nRBC: 0 % (ref 0–0)

## 2013-09-09 LAB — COMPREHENSIVE METABOLIC PANEL (CC13)
ALT: 21 U/L (ref 0–55)
AST: 15 U/L (ref 5–34)
Albumin: 3.9 g/dL (ref 3.5–5.0)
Alkaline Phosphatase: 59 U/L (ref 40–150)
Anion Gap: 12 mEq/L — ABNORMAL HIGH (ref 3–11)
BUN: 16.5 mg/dL (ref 7.0–26.0)
CO2: 22 mEq/L (ref 22–29)
Calcium: 10 mg/dL (ref 8.4–10.4)
Chloride: 103 mEq/L (ref 98–109)
Creatinine: 0.8 mg/dL (ref 0.7–1.3)
Glucose: 130 mg/dl (ref 70–140)
Potassium: 4.4 mEq/L (ref 3.5–5.1)
Sodium: 136 mEq/L (ref 136–145)
Total Bilirubin: 0.2 mg/dL (ref 0.20–1.20)
Total Protein: 7.6 g/dL (ref 6.4–8.3)

## 2013-09-09 LAB — MAGNESIUM (CC13): Magnesium: 2.1 mg/dl (ref 1.5–2.5)

## 2013-09-09 MED ORDER — POTASSIUM CHLORIDE 2 MEQ/ML IV SOLN
Freq: Once | INTRAVENOUS | Status: AC
  Administered 2013-09-09: 11:00:00 via INTRAVENOUS
  Filled 2013-09-09: qty 10

## 2013-09-09 MED ORDER — SODIUM CHLORIDE 0.9 % IV SOLN
Freq: Once | INTRAVENOUS | Status: AC
  Administered 2013-09-09: 20 mL via INTRAVENOUS

## 2013-09-09 MED ORDER — PALONOSETRON HCL INJECTION 0.25 MG/5ML
0.2500 mg | Freq: Once | INTRAVENOUS | Status: AC
  Administered 2013-09-09: 0.25 mg via INTRAVENOUS

## 2013-09-09 MED ORDER — SODIUM CHLORIDE 0.9 % IV SOLN
500.0000 mg/m2 | Freq: Once | INTRAVENOUS | Status: AC
  Administered 2013-09-09: 850 mg via INTRAVENOUS
  Filled 2013-09-09: qty 34

## 2013-09-09 MED ORDER — DEXAMETHASONE SODIUM PHOSPHATE 20 MG/5ML IJ SOLN
INTRAMUSCULAR | Status: AC
  Filled 2013-09-09: qty 5

## 2013-09-09 MED ORDER — PALONOSETRON HCL INJECTION 0.25 MG/5ML
INTRAVENOUS | Status: AC
  Filled 2013-09-09: qty 5

## 2013-09-09 MED ORDER — CISPLATIN CHEMO INJECTION 100MG/100ML
75.0000 mg/m2 | Freq: Once | INTRAVENOUS | Status: AC
  Administered 2013-09-09: 130 mg via INTRAVENOUS
  Filled 2013-09-09: qty 130

## 2013-09-09 MED ORDER — SODIUM CHLORIDE 0.9 % IV SOLN
150.0000 mg | Freq: Once | INTRAVENOUS | Status: AC
  Administered 2013-09-09 (×2): 150 mg via INTRAVENOUS
  Filled 2013-09-09: qty 5

## 2013-09-09 MED ORDER — DEXAMETHASONE SODIUM PHOSPHATE 20 MG/5ML IJ SOLN
12.0000 mg | Freq: Once | INTRAMUSCULAR | Status: AC
  Administered 2013-09-09: 12 mg via INTRAVENOUS

## 2013-09-09 NOTE — Telephone Encounter (Signed)
Gave pt appt for labs and MD until  July 2015

## 2013-09-09 NOTE — Patient Instructions (Signed)
Continue chemotherapy today as scheduled.  Followup visit in 4 weeks with repeat CT scan of the chest.

## 2013-09-09 NOTE — Progress Notes (Signed)
[facility]  Telephone:(336) [phone] Fax:(336) [phone] OFFICE PROGRESS NOTE  [name], MD [phone] [facility] [place] [address]  DIAGNOSIS: Stage IIIA (T2a., N2, M0) non-small cell lung cancer adenocarcinoma with negative EGFR mutation and negative ALK gene translocation involving the left upper lobe middle mediastinal lymphadenopathy diagnosed in August of 2014  Lung cancer   Primary site: Lung (Left)   Staging method: AJCC 7th Edition   Clinical: Stage IIIA (T2a, N2, M0) signed by [name], MD on 06/19/2013  5:06 PM   Summary: Stage IIIA (T2a, N2, M0).  Molecular biomarkers: Positive for NF1E1059f*7, [id number] ATM splice site [id number]>KG TP53 E271K, SETD2 N1361f17 and SPOP E47K                                          Negative for: RET, ALK, BRAF, KRAS, ERBB2, MET and EGFR.  PRIOR THERAPY: Status post left upper lobectomy with mediastinal lymph node dissection on 05/29/2013  CURRENT THERAPY: Adjuvant chemotherapy with cisplatin at 75 mg per meter squared and Alimta at 500 mg per meter squared given every 3 weeks for a total of 4 cycles. First cycle given on 07/10/2013  DISEASE STAGE: Lung cancer   Primary site: Lung (Left)   Staging method: AJCC 7th Edition   Clinical: Stage IIIA (T2a, N2, M0) signed by [name] on 06/19/2013  5:06 PM   Summary: Stage IIIA (T2a, N2, M0)  CHEMOTHERAPY INTENT: Control/curative  CURRENT # OF CHEMOTHERAPY CYCLES: 4  CURRENT ANTIEMETICS: Aloxi, dexamethasone, Emend, Compazine  CURRENT SMOKING STATUS: Former smoker  ORAL CHEMOTHERAPY AND CONSENT: n/a  CURRENT BISPHOSPHONATES USE: none  PAIN MANAGEMENT: 0/10  NARCOTICS INDUCED CONSTIPATION: None  LIVING WILL AND CODE STATUS: Full code   INTERVAL HISTORY: Jordan DUVALL73.o. male returns for a symptom management visit after receiving his first cycle of adjuvant chemotherapy with cisplatin and Alimta. Overall  he tolerated the first cycle of chemotherapy relatively well with the exception of some nausea, increased belching, stomach cramps and fatigue that lasted for almost a week after his chemotherapy. He denied having any significant fever or chills. He denied having any significant chest pain, shortness of breath, cough or hemoptysis. The patient denied having any weight loss or night sweats. He is here today to start the fourth cycle of his chemotherapy.  MEDICAL HISTORY: Past Medical History  Diagnosis Date  . Hyperlipidemia   . Diverticulosis   . Coronary artery disease     MI 1992, S/P  PTCA; negative stress test in November 2011 with no ischemia.   . Peripheral vascular disease 1/14    4.8x4.6 cm infrarenal abdominal aortic fusiform aneurysm, 1.5 cm right common iliac artery aneurysm  . AAA (abdominal aortic aneurysm)   . Hearing loss     Bilateral   . Myocardial infarction 1992    Dr [name]  . Lung cancer 10-14    non-small cell lung cancer    ALLERGIES:  has No Known Allergies.  MEDICATIONS:  Current Outpatient Prescriptions  Medication Sig Dispense Refill  . aspirin EC 81 MG tablet Take 81 mg by mouth daily.      . Marland Kitchenexamethasone (DECADRON) 4 MG tablet Take 4 mg by mouth 2 (two) times daily. 4 mg by mouth 2 times a day, the day before, day of and day after the chemotherapy.      .Marland Kitchen  folic acid (FOLVITE) 1 MG tablet Take 1 tablet (1 mg total) by mouth daily.  30 tablet  2  . Glucosamine-Chondroit-Vit C-Mn (GLUCOSAMINE 1500 COMPLEX PO) Take by mouth.      . Multiple Vitamin (MULTIVITAMIN) tablet Take 1 tablet by mouth every evening.       . prochlorperazine (COMPAZINE) 10 MG tablet 10 mg.      . rosuvastatin (CRESTOR) 40 MG tablet Take 1 tablet (40 mg total) by mouth daily.  30 tablet  2   No current facility-administered medications for this visit.    SURGICAL HISTORY:  Past Surgical History  Procedure Laterality Date  . Ptca  1992  . Inguinal heriiorrhaphy bilaterally      . Colonoscopy w/ polypectomy  2003     negative 2010,due 2020; Dr Olevia Perches  . Pilonidal cyst excision    . Rotator cuff repair      Bilateral  . Cystoscopy/retrograde/ureteroscopy Bilateral 10/09/2012    Procedure: BILATERAL RETROGRADE bladder and urethral BIOPSY ;  Surgeon: Molli Hazard, MD;  Location: [facility];  Service: Urology;  Laterality: Bilateral;  BILATERAL RETROGRADE  AND bladder and urethral BIOPSY    . Prostate biopsy N/A 10/09/2012    Procedure: PROSTATIC URETHRAL BIOPSY;  Surgeon: Molli Hazard, MD;  Location: [facility];  Service: Urology;  Laterality: N/A;  PROSTATIC URETHRAL BIOPSY    . Hernia repair Bilateral     Inguinal  . Mediastinoscopy N/A 05/01/2013    Procedure: MEDIASTINOSCOPY;  Surgeon: Gaye Pollack, MD;  Location: [facility] OR;  Service: Thoracic;  Laterality: N/A;  . Video bronchoscopy N/A 05/01/2013    Procedure: VIDEO BRONCHOSCOPY;  Surgeon: Gaye Pollack, MD;  Location: [facility] OR;  Service: Thoracic;  Laterality: N/A;  . Coronary angioplasty      no stents  . Thorocotomy with lobectomy Left 05/29/2013    Procedure: LEFT THOROCOTOMY WITH LEFT UPPER LOBE LOBECTOMY;  Surgeon: Gaye Pollack, MD;  Location: [facility] OR;  Service: Thoracic;  Laterality: Left;    REVIEW OF SYSTEMS:  Constitutional: negative Eyes: negative Ears, nose, mouth, throat, and face: negative Respiratory: negative Cardiovascular: negative Gastrointestinal: negative Genitourinary:negative Integument/breast: negative Hematologic/lymphatic: negative Musculoskeletal:positive for myalgias and Residual soreness from his left upper lobe lobectomy surgery Neurological: positive for paresthesia Behavioral/Psych: negative Endocrine: negative Allergic/Immunologic: negative   PHYSICAL EXAMINATION: General appearance: alert, cooperative, appears stated age and no distress Head: Normocephalic, without obvious abnormality, atraumatic Neck: no adenopathy, no carotid bruit, no JVD, supple,  symmetrical, trachea midline and thyroid not enlarged, symmetric, no tenderness/mass/nodules Lymph nodes: Cervical, supraclavicular, and axillary nodes normal. Resp: clear to auscultation bilaterally Cardio: S1, S2 normal and Occasional missed/skipped beat(s) GI: soft, non-tender; bowel sounds normal; no masses,  no organomegaly Extremities: extremities normal, atraumatic, no cyanosis or edema Neurologic: Alert and oriented X 3, normal strength and tone. Normal symmetric reflexes. Normal coordination and gait  ECOG PERFORMANCE STATUS: 1 - Symptomatic but completely ambulatory  Blood pressure 145/96, pulse 91, temperature 97.6 F (36.4 C), temperature source Oral, resp. rate 18, height _0  (1.651 m), weight 157 lb 3.2 oz (71.305 kg), SpO2 98.00%.  LABORATORY DATA: Lab Results  Component Value Date   WBC 6.6 09/09/2013   HGB 12.1* 09/09/2013   HCT 35.1* 09/09/2013   MCV 88.2 09/09/2013   PLT 367 09/09/2013      Chemistry      Component Value Date/Time   NA 140 09/02/2013 0953   NA 138 07/03/2013 1406   K 5.4* 09/02/2013 1610  K 4.6 07/03/2013 1406   CL 102 07/03/2013 1406   CO2 25 09/02/2013 0953   CO2 29 07/03/2013 1406   BUN 11.4 09/02/2013 0953   BUN 11 07/03/2013 1406   CREATININE 0.8 09/02/2013 0953   CREATININE 0.7 07/03/2013 1406   CREATININE 0.76 04/11/2013 0928      Component Value Date/Time   CALCIUM 9.9 09/02/2013 0953   CALCIUM 9.9 07/03/2013 1406   ALKPHOS 73 09/02/2013 0953   ALKPHOS 60 07/03/2013 1406   AST 20 09/02/2013 0953   AST 23 07/03/2013 1406   ALT 24 09/02/2013 0953   ALT 33 07/03/2013 1406   BILITOT 0.31 09/02/2013 0953   BILITOT 0.5 07/03/2013 1406       RADIOGRAPHIC STUDIES:   ASSESSMENT/PLAN: This is a very pleasant 73 year old Caucasian male recently diagnosed with stage IIIA (T2a., N2, M0) non-small cell lung cancer, adenocarcinoma involving the left upper lobe and mediastinal lymphadenopathy diagnosed in August of 2014. He is status post left  upper lobectomy with mediastinal lymph node dissection on 05/29/2013. He is status post 3 cycles of adjuvant chemotherapy with cisplatin 75 mg per meter squared and Alimta 500 mg per meter squared given every 3 weeks.  The patient is doing fine today. I recommended for him to proceed with the second cycle of his adjuvant chemotherapy as scheduled. For nausea, the patient was advised to take Compazine 10 mg by mouth every 6 hours on a scheduled basis for few days starting from day 4 after the chemotherapy. He would come back for followup visit in 4 weeks after repeating CT scan of the chest for restaging of his disease. He was advised to call immediately if he has any concerning symptoms in the interval. All questions were answered. The patient knows to call the clinic with any problems, questions or concerns. We can certainly see the patient much sooner if necessary.

## 2013-09-16 ENCOUNTER — Other Ambulatory Visit (HOSPITAL_BASED_OUTPATIENT_CLINIC_OR_DEPARTMENT_OTHER): Payer: Medicare Other

## 2013-09-16 DIAGNOSIS — C3410 Malignant neoplasm of upper lobe, unspecified bronchus or lung: Secondary | ICD-10-CM

## 2013-09-16 DIAGNOSIS — C3492 Malignant neoplasm of unspecified part of left bronchus or lung: Secondary | ICD-10-CM

## 2013-09-16 LAB — COMPREHENSIVE METABOLIC PANEL (CC13)
ALT: 28 U/L (ref 0–55)
AST: 20 U/L (ref 5–34)
Albumin: 3.7 g/dL (ref 3.5–5.0)
Alkaline Phosphatase: 62 U/L (ref 40–150)
Anion Gap: 8 mEq/L (ref 3–11)
BUN: 18.1 mg/dL (ref 7.0–26.0)
CO2: 28 mEq/L (ref 22–29)
Calcium: 9.8 mg/dL (ref 8.4–10.4)
Chloride: 100 mEq/L (ref 98–109)
Creatinine: 0.8 mg/dL (ref 0.7–1.3)
Glucose: 134 mg/dl (ref 70–140)
Potassium: 4.8 mEq/L (ref 3.5–5.1)
Sodium: 137 mEq/L (ref 136–145)
Total Bilirubin: 0.34 mg/dL (ref 0.20–1.20)
Total Protein: 7 g/dL (ref 6.4–8.3)

## 2013-09-16 LAB — CBC WITH DIFFERENTIAL/PLATELET
BASO%: 0.3 % (ref 0.0–2.0)
Basophils Absolute: 0 10*3/uL (ref 0.0–0.1)
EOS%: 1.1 % (ref 0.0–7.0)
Eosinophils Absolute: 0.1 10*3/uL (ref 0.0–0.5)
HCT: 34.2 % — ABNORMAL LOW (ref 38.4–49.9)
HGB: 11.9 g/dL — ABNORMAL LOW (ref 13.0–17.1)
LYMPH%: 34.6 % (ref 14.0–49.0)
MCH: 31.5 pg (ref 27.2–33.4)
MCHC: 34.8 g/dL (ref 32.0–36.0)
MCV: 90.6 fL (ref 79.3–98.0)
MONO#: 0.6 10*3/uL (ref 0.1–0.9)
MONO%: 11 % (ref 0.0–14.0)
NEUT#: 2.7 10*3/uL (ref 1.5–6.5)
NEUT%: 53 % (ref 39.0–75.0)
Platelets: 232 10*3/uL (ref 140–400)
RBC: 3.77 10*6/uL — ABNORMAL LOW (ref 4.20–5.82)
RDW: 16.1 % — ABNORMAL HIGH (ref 11.0–14.6)
WBC: 5.1 10*3/uL (ref 4.0–10.3)
lymph#: 1.8 10*3/uL (ref 0.9–3.3)

## 2013-09-16 LAB — MAGNESIUM (CC13): Magnesium: 2 mg/dl (ref 1.5–2.5)

## 2013-09-18 ENCOUNTER — Ambulatory Visit: Payer: Medicare Other | Admitting: Radiation Oncology

## 2013-09-18 ENCOUNTER — Ambulatory Visit: Payer: Medicare Other

## 2013-09-23 ENCOUNTER — Other Ambulatory Visit (HOSPITAL_BASED_OUTPATIENT_CLINIC_OR_DEPARTMENT_OTHER): Payer: Medicare Other

## 2013-09-23 DIAGNOSIS — C3410 Malignant neoplasm of upper lobe, unspecified bronchus or lung: Secondary | ICD-10-CM

## 2013-09-23 DIAGNOSIS — C771 Secondary and unspecified malignant neoplasm of intrathoracic lymph nodes: Secondary | ICD-10-CM

## 2013-09-23 DIAGNOSIS — C3492 Malignant neoplasm of unspecified part of left bronchus or lung: Secondary | ICD-10-CM

## 2013-09-23 LAB — COMPREHENSIVE METABOLIC PANEL (CC13)
ALT: 23 U/L (ref 0–55)
AST: 20 U/L (ref 5–34)
Albumin: 3.9 g/dL (ref 3.5–5.0)
Alkaline Phosphatase: 62 U/L (ref 40–150)
Anion Gap: 8 mEq/L (ref 3–11)
BUN: 10.7 mg/dL (ref 7.0–26.0)
CO2: 26 mEq/L (ref 22–29)
Calcium: 10 mg/dL (ref 8.4–10.4)
Chloride: 105 mEq/L (ref 98–109)
Creatinine: 0.8 mg/dL (ref 0.7–1.3)
Glucose: 114 mg/dl (ref 70–140)
Potassium: 5 mEq/L (ref 3.5–5.1)
Sodium: 139 mEq/L (ref 136–145)
Total Bilirubin: 0.35 mg/dL (ref 0.20–1.20)
Total Protein: 7 g/dL (ref 6.4–8.3)

## 2013-09-23 LAB — CBC WITH DIFFERENTIAL/PLATELET
BASO%: 0.3 % (ref 0.0–2.0)
Basophils Absolute: 0 10*3/uL (ref 0.0–0.1)
EOS%: 0.6 % (ref 0.0–7.0)
Eosinophils Absolute: 0 10*3/uL (ref 0.0–0.5)
HCT: 33.2 % — ABNORMAL LOW (ref 38.4–49.9)
HGB: 11.4 g/dL — ABNORMAL LOW (ref 13.0–17.1)
LYMPH%: 18.8 % (ref 14.0–49.0)
MCH: 31.6 pg (ref 27.2–33.4)
MCHC: 34.4 g/dL (ref 32.0–36.0)
MCV: 91.9 fL (ref 79.3–98.0)
MONO#: 0.6 10*3/uL (ref 0.1–0.9)
MONO%: 9.4 % (ref 0.0–14.0)
NEUT#: 4.5 10*3/uL (ref 1.5–6.5)
NEUT%: 70.9 % (ref 39.0–75.0)
Platelets: 190 10*3/uL (ref 140–400)
RBC: 3.61 10*6/uL — ABNORMAL LOW (ref 4.20–5.82)
RDW: 16.6 % — ABNORMAL HIGH (ref 11.0–14.6)
WBC: 6.4 10*3/uL (ref 4.0–10.3)
lymph#: 1.2 10*3/uL (ref 0.9–3.3)

## 2013-09-23 LAB — MAGNESIUM (CC13): Magnesium: 2 mg/dl (ref 1.5–2.5)

## 2013-09-26 ENCOUNTER — Telehealth: Payer: Self-pay | Admitting: *Deleted

## 2013-09-26 NOTE — Telephone Encounter (Signed)
Left msg for patient to call to reschedule missed appt. With Dr. Lisbeth Renshaw

## 2013-09-28 ENCOUNTER — Other Ambulatory Visit: Payer: Self-pay | Admitting: Internal Medicine

## 2013-09-30 ENCOUNTER — Encounter: Payer: Self-pay | Admitting: *Deleted

## 2013-09-30 ENCOUNTER — Other Ambulatory Visit: Payer: Self-pay | Admitting: *Deleted

## 2013-09-30 ENCOUNTER — Other Ambulatory Visit (HOSPITAL_BASED_OUTPATIENT_CLINIC_OR_DEPARTMENT_OTHER): Payer: Medicare Other

## 2013-09-30 DIAGNOSIS — C3490 Malignant neoplasm of unspecified part of unspecified bronchus or lung: Secondary | ICD-10-CM

## 2013-09-30 DIAGNOSIS — C3410 Malignant neoplasm of upper lobe, unspecified bronchus or lung: Secondary | ICD-10-CM

## 2013-09-30 LAB — COMPREHENSIVE METABOLIC PANEL (CC13)
ALT: 22 U/L (ref 0–55)
AST: 22 U/L (ref 5–34)
Albumin: 3.9 g/dL (ref 3.5–5.0)
Alkaline Phosphatase: 63 U/L (ref 40–150)
Anion Gap: 8 mEq/L (ref 3–11)
BUN: 10.2 mg/dL (ref 7.0–26.0)
CO2: 27 mEq/L (ref 22–29)
Calcium: 10.2 mg/dL (ref 8.4–10.4)
Chloride: 104 mEq/L (ref 98–109)
Creatinine: 0.8 mg/dL (ref 0.7–1.3)
Glucose: 124 mg/dl (ref 70–140)
Potassium: 5.2 mEq/L — ABNORMAL HIGH (ref 3.5–5.1)
Sodium: 139 mEq/L (ref 136–145)
Total Bilirubin: 0.32 mg/dL (ref 0.20–1.20)
Total Protein: 7.2 g/dL (ref 6.4–8.3)

## 2013-09-30 LAB — CBC WITH DIFFERENTIAL/PLATELET
BASO%: 0 % (ref 0.0–2.0)
Basophils Absolute: 0 10*3/uL (ref 0.0–0.1)
EOS%: 2.3 % (ref 0.0–7.0)
Eosinophils Absolute: 0.1 10*3/uL (ref 0.0–0.5)
HCT: 33.3 % — ABNORMAL LOW (ref 38.4–49.9)
HGB: 11.4 g/dL — ABNORMAL LOW (ref 13.0–17.1)
LYMPH%: 32.5 % (ref 14.0–49.0)
MCH: 30.8 pg (ref 27.2–33.4)
MCHC: 34.2 g/dL (ref 32.0–36.0)
MCV: 90 fL (ref 79.3–98.0)
MONO#: 0.8 10*3/uL (ref 0.1–0.9)
MONO%: 20.3 % — ABNORMAL HIGH (ref 0.0–14.0)
NEUT#: 1.7 10*3/uL (ref 1.5–6.5)
NEUT%: 44.9 % (ref 39.0–75.0)
Platelets: 344 10*3/uL (ref 140–400)
RBC: 3.7 10*6/uL — ABNORMAL LOW (ref 4.20–5.82)
RDW: 16.4 % — ABNORMAL HIGH (ref 11.0–14.6)
WBC: 3.9 10*3/uL — ABNORMAL LOW (ref 4.0–10.3)
lymph#: 1.3 10*3/uL (ref 0.9–3.3)
nRBC: 0 % (ref 0–0)

## 2013-10-07 ENCOUNTER — Ambulatory Visit (HOSPITAL_COMMUNITY)
Admission: RE | Admit: 2013-10-07 | Discharge: 2013-10-07 | Disposition: A | Discharge status: Home / Self Care | Payer: Medicare Other | Source: Ambulatory Visit | Attending: Internal Medicine | Admitting: Internal Medicine

## 2013-10-07 ENCOUNTER — Other Ambulatory Visit (HOSPITAL_BASED_OUTPATIENT_CLINIC_OR_DEPARTMENT_OTHER): Payer: Medicare Other

## 2013-10-07 ENCOUNTER — Encounter (HOSPITAL_COMMUNITY): Payer: Self-pay

## 2013-10-07 DIAGNOSIS — C3490 Malignant neoplasm of unspecified part of unspecified bronchus or lung: Secondary | ICD-10-CM

## 2013-10-07 DIAGNOSIS — C3410 Malignant neoplasm of upper lobe, unspecified bronchus or lung: Secondary | ICD-10-CM

## 2013-10-07 DIAGNOSIS — Z902 Acquired absence of lung [part of]: Secondary | ICD-10-CM | POA: Insufficient documentation

## 2013-10-07 LAB — COMPREHENSIVE METABOLIC PANEL
ALT: 26 U/L (ref 0–53)
AST: 30 U/L (ref 0–37)
Albumin: 4.1 g/dL (ref 3.5–5.2)
Alkaline Phosphatase: 67 U/L (ref 39–117)
BUN: 11 mg/dL (ref 6–23)
CO2: 27 mEq/L (ref 19–32)
Calcium: 10.2 mg/dL (ref 8.4–10.5)
Chloride: 101 mEq/L (ref 96–112)
Creatinine, Ser: 0.86 mg/dL (ref 0.50–1.35)
Glucose, Bld: 112 mg/dL — ABNORMAL HIGH (ref 70–99)
Potassium: 5.1 mEq/L (ref 3.5–5.3)
Sodium: 141 mEq/L (ref 135–145)
Total Bilirubin: 0.3 mg/dL (ref 0.3–1.2)
Total Protein: 7.9 g/dL (ref 6.0–8.3)

## 2013-10-07 LAB — CBC WITH DIFFERENTIAL/PLATELET
BASO%: 0.4 % (ref 0.0–2.0)
Basophils Absolute: 0 10*3/uL (ref 0.0–0.1)
EOS%: 2.1 % (ref 0.0–7.0)
Eosinophils Absolute: 0.1 10*3/uL (ref 0.0–0.5)
HCT: 35.6 % — ABNORMAL LOW (ref 38.4–49.9)
HGB: 12 g/dL — ABNORMAL LOW (ref 13.0–17.1)
LYMPH%: 30.6 % (ref 14.0–49.0)
MCH: 30.8 pg (ref 27.2–33.4)
MCHC: 33.7 g/dL (ref 32.0–36.0)
MCV: 91.3 fL (ref 79.3–98.0)
MONO#: 0.6 10*3/uL (ref 0.1–0.9)
MONO%: 11.9 % (ref 0.0–14.0)
NEUT#: 2.6 10*3/uL (ref 1.5–6.5)
NEUT%: 55 % (ref 39.0–75.0)
Platelets: 275 10*3/uL (ref 140–400)
RBC: 3.9 10*6/uL — ABNORMAL LOW (ref 4.20–5.82)
RDW: 16.4 % — ABNORMAL HIGH (ref 11.0–14.6)
WBC: 4.7 10*3/uL (ref 4.0–10.3)
lymph#: 1.4 10*3/uL (ref 0.9–3.3)

## 2013-10-07 MED ORDER — IOHEXOL 300 MG/ML  SOLN
80.0000 mL | Freq: Once | INTRAMUSCULAR | Status: AC | PRN
  Administered 2013-10-07: 80 mL via INTRAVENOUS

## 2013-10-09 ENCOUNTER — Telehealth: Payer: Self-pay | Admitting: Internal Medicine

## 2013-10-09 ENCOUNTER — Encounter: Payer: Self-pay | Admitting: Internal Medicine

## 2013-10-09 ENCOUNTER — Ambulatory Visit (HOSPITAL_BASED_OUTPATIENT_CLINIC_OR_DEPARTMENT_OTHER): Payer: Medicare Other | Admitting: Internal Medicine

## 2013-10-09 VITALS — BP 156/91 | HR 98 | Temp 97.8°F | Resp 18 | Ht 65.0 in | Wt 159.0 lb

## 2013-10-09 DIAGNOSIS — C771 Secondary and unspecified malignant neoplasm of intrathoracic lymph nodes: Secondary | ICD-10-CM

## 2013-10-09 DIAGNOSIS — C3410 Malignant neoplasm of upper lobe, unspecified bronchus or lung: Secondary | ICD-10-CM

## 2013-10-09 DIAGNOSIS — C3490 Malignant neoplasm of unspecified part of unspecified bronchus or lung: Secondary | ICD-10-CM

## 2013-10-09 NOTE — Progress Notes (Signed)
Jordan Mccullough  [phone]) [phone] [phone] OFFICE PROGRESS NOTE  Jordan Cobble, MD [address] [place] [address]  DIAGNOSIS: Stage IIIA (T2a., N2, M0) non-small cell lung cancer adenocarcinoma with negative EGFR mutation and negative ALK gene translocation involving the left upper lobe middle mediastinal lymphadenopathy diagnosed in August of 2014  Lung cancer   Primary site: Lung (Left)   Staging method: AJCC 7th Edition   Clinical: Stage IIIA (T2a, N2, M0) signed by [name], MD on 06/19/2013  5:06 PM   Summary: Stage IIIA (T2a, N2, M0).  Molecular biomarkers: Positive for [id number], [id number] ATM splice site [id number] TP53 E271K, SETD2 [id number] and SPOP E47K                                          Negative for: RET, ALK, BRAF, KRAS, ERBB2, MET and EGFR.  PRIOR THERAPY:  1) Status post left upper lobectomy with mediastinal lymph node dissection on 05/29/2013. 2) Adjuvant chemotherapy with cisplatin at 75 mg per meter squared and Alimta at 500 mg per meter squared given every 3 weeks for a total of 4 cycles. First cycle given on 07/10/2013   CURRENT THERAPY: Observation.  DISEASE STAGE: Lung cancer   Primary site: Lung (Left)   Staging method: AJCC 7th Edition   Clinical: Stage IIIA (T2a, N2, M0) signed by [name] on 06/19/2013  5:06 PM   Summary: Stage IIIA (T2a, N2, M0)  CHEMOTHERAPY INTENT: Control/curative  CURRENT # OF CHEMOTHERAPY CYCLES: 0  CURRENT ANTIEMETICS: Aloxi, dexamethasone, Emend, Compazine  CURRENT SMOKING STATUS: Former smoker  ORAL CHEMOTHERAPY AND CONSENT: n/a  CURRENT BISPHOSPHONATES USE: none  PAIN MANAGEMENT: 0/10  NARCOTICS INDUCED CONSTIPATION: None  LIVING WILL AND CODE STATUS: Full code   INTERVAL HISTORY: AnEMIN FOREE89.o. male returns for followup visit accompanied by his wife and daughter. The patient is feeling fine today with no specific complaints except  for headaches and occasional dizzy spells. He tolerated the last cycle of his systemic chemotherapy with cisplatin and Alimta fairly well. He denied having any significant nausea or vomiting, no fever or chills. The patient denied having any significant chest pain, shortness of breath, cough or hemoptysis. He still has some hoarseness of her was started after his surgical resection. He had repeat CT scan of the chest performed recently and he is here for evaluation and discussion of his scan results.  MEDICAL HISTORY: Past Medical History  Diagnosis Date  . Hyperlipidemia   . Diverticulosis   . Coronary artery disease     MI 1992, S/P  PTCA; negative stress test in November 2011 with no ischemia.   . Peripheral vascular disease 1/14    4.8x4.6 cm infrarenal abdominal aortic fusiform aneurysm, 1.5 cm right common iliac artery aneurysm  . AAA (abdominal aortic aneurysm)   . Hearing loss     Bilateral   . Myocardial infarction 1992    Dr Jordan Mccullough Form  . Lung cancer 10-14    non-small cell lung cancer    ALLERGIES:  has No Known Allergies.  MEDICATIONS:  Current Outpatient Prescriptions  Medication Sig Dispense Refill  . aspirin EC 81 MG tablet Take 81 mg by mouth daily.      . Glucosamine-Chondroit-Vit C-Mn (GLUCOSAMINE 1500 COMPLEX PO) Take by mouth.      . Multiple Vitamin (MULTIVITAMIN) tablet Take 1  tablet by mouth every evening.       . rosuvastatin (CRESTOR) 40 MG tablet Take 1 tablet (40 mg total) by mouth daily.  30 tablet  2   No current facility-administered medications for this visit.    SURGICAL HISTORY:  Past Surgical History  Procedure Laterality Date  . Ptca  1992  . Inguinal heriiorrhaphy bilaterally    . Colonoscopy w/ polypectomy  2003     negative 2010,due 2020; Dr Jordan Mccullough  . Pilonidal cyst excision    . Rotator cuff repair      Bilateral  . Cystoscopy/retrograde/ureteroscopy Bilateral 10/09/2012    Procedure: BILATERAL RETROGRADE bladder and urethral BIOPSY ;   Surgeon: Molli Hazard, MD;  Location: [facility];  Service: Urology;  Laterality: Bilateral;  BILATERAL RETROGRADE  AND bladder and urethral BIOPSY    . Prostate biopsy N/A 10/09/2012    Procedure: PROSTATIC URETHRAL BIOPSY;  Surgeon: Molli Hazard, MD;  Location: [facility];  Service: Urology;  Laterality: N/A;  PROSTATIC URETHRAL BIOPSY    . Hernia repair Bilateral     Inguinal  . Mediastinoscopy N/A 05/01/2013    Procedure: MEDIASTINOSCOPY;  Surgeon: Jordan Pollack, MD;  Location: [facility] OR;  Service: Thoracic;  Laterality: N/A;  . Video bronchoscopy N/A 05/01/2013    Procedure: VIDEO BRONCHOSCOPY;  Surgeon: Jordan Pollack, MD;  [facility];  Service: Thoracic;  Laterality: N/A;  . Coronary angioplasty      no stents  . Thorocotomy with lobectomy Left 05/29/2013    Procedure: LEFT THOROCOTOMY WITH LEFT UPPER LOBE LOBECTOMY;  Surgeon: Jordan Pollack, MD;  Location: [facility] OR;  Service: Thoracic;  Laterality: Left;    REVIEW OF SYSTEMS:  Constitutional: negative Eyes: negative Ears, nose, mouth, throat, and face: negative Respiratory: negative Cardiovascular: negative Gastrointestinal: negative Genitourinary:negative Integument/breast: negative Hematologic/lymphatic: negative Musculoskeletal:positive for myalgias and Residual soreness from his left upper lobe lobectomy surgery Neurological: positive for paresthesia Behavioral/Psych: negative Endocrine: negative Allergic/Immunologic: negative   PHYSICAL EXAMINATION: General appearance: alert, cooperative, appears stated age and no distress Head: Normocephalic, without obvious abnormality, atraumatic Neck: no adenopathy, no carotid bruit, no JVD, supple, symmetrical, trachea midline and thyroid not enlarged, symmetric, no tenderness/mass/nodules Lymph nodes: Cervical, supraclavicular, and axillary nodes normal. Resp: clear to auscultation bilaterally Cardio: S1, S2 normal and Occasional missed/skipped beat(s) GI: soft,  non-tender; bowel sounds normal; no masses,  no organomegaly Extremities: extremities normal, atraumatic, no cyanosis or edema Neurologic: Alert and oriented X 3, normal strength and tone. Normal symmetric reflexes. Normal coordination and gait  ECOG PERFORMANCE STATUS: 1 - Symptomatic but completely ambulatory  Blood pressure 156/91, pulse 98, temperature 97.8 F (36.6 C), temperature source Oral, resp. rate 18, height _0  (1.651 m), weight 159 lb (72.122 kg).  LABORATORY DATA: Lab Results  Component Value Date   WBC 4.7 10/07/2013   HGB 12.0* 10/07/2013   HCT 35.6* 10/07/2013   MCV 91.3 10/07/2013   PLT 275 10/07/2013      Chemistry      Component Value Date/Time   NA 141 10/07/2013 1110   NA 139 09/30/2013 0957   K 5.1 10/07/2013 1110   K 5.2* 09/30/2013 0957   CL 101 10/07/2013 1110   CO2 27 10/07/2013 1110   CO2 27 09/30/2013 0957   BUN 11 10/07/2013 1110   BUN 10.2 09/30/2013 0957   CREATININE 0.86 10/07/2013 1110   CREATININE 0.8 09/30/2013 0957   CREATININE 0.76 04/11/2013 0928      Component Value  Date/Time   CALCIUM 10.2 10/07/2013 1110   CALCIUM 10.2 09/30/2013 0957   ALKPHOS 67 10/07/2013 1110   ALKPHOS 63 09/30/2013 0957   AST 30 10/07/2013 1110   AST 22 09/30/2013 0957   ALT 26 10/07/2013 1110   ALT 22 09/30/2013 0957   BILITOT 0.3 10/07/2013 1110   BILITOT 0.32 09/30/2013 0957       RADIOGRAPHIC STUDIES: Ct Chest W Contrast  10/07/2013   CLINICAL DATA:  Lung cancer.  EXAM: CT CHEST WITH CONTRAST  TECHNIQUE: Multidetector CT imaging of the chest was performed during intravenous contrast administration.  CONTRAST:  24m OMNIPAQUE IOHEXOL 300 MG/ML  SOLN  COMPARISON:  04/14/2013  FINDINGS: The chest wall is unremarkable and stable. No supraclavicular or axillary mass or adenopathy. The thyroid gland appears normal. The bony thorax is intact. No destructive bone lesions or spinal canal compromise.  The heart is normal in size. No pericardial effusion. No mediastinal or hilar  mass or adenopathy. Small scattered lymph nodes are stable. Stable surgical changes from a probable left upper lobe lobectomy. The aorta is normal in caliber. No dissection. The branch vessels are patent. The esophagus is normal.  Examination of the lung parenchyma demonstrates no acute pulmonary findings. No worrisome pulmonary nodules or lesions. No pleural effusion.  The upper abdomen is unremarkable. Renal cysts are noted. No adrenal gland lesions. Chololithiasis.  IMPRESSION: Status post left upper lobe lobectomy with resection of left upper lobe lung cancer. No findings for recurrent disease, adenopathy or metastatic disease.   Electronically Signed   By: MKalman JewelsM.D.   On: 10/07/2013 12:56   ASSESSMENT/PLAN: This is a very pleasant 73year old Caucasian male recently diagnosed with stage IIIA (T2a., N2, M0) non-small cell lung cancer, adenocarcinoma involving the left upper lobe and mediastinal lymphadenopathy diagnosed in August of 2014. He is status post left upper lobectomy with mediastinal lymph node dissection on 05/29/2013. He is status post 4 cycles of adjuvant chemotherapy with cisplatin 75 mg per meter squared and Alimta 500 mg per meter squared given every 3 weeks.  He tolerated his treatment fairly well. The recent CT scan of the chest showed no evidence for disease recurrence. I discussed the scan results with the patient and his family. He'll see Dr. MLisbeth Renshawfor consideration of adjuvant radiotherapy to the mediastinum. I will see the patient back for followup visit in 3 months with repeat CT scan of the chest for restaging of his disease. For the persistent headache and dizzy spells, I will order MRI of the brain to rule out any brain metastasis. He was advised to call immediately if he has any concerning symptoms in the interval. All questions were answered. The patient knows to call the clinic with any problems, questions or concerns. We can certainly see the patient much sooner  if necessary.  Disclaimer: This note was dictated with voice recognition software. Similar sounding words can inadvertently be transcribed and may not be corrected upon review.

## 2013-10-09 NOTE — Telephone Encounter (Signed)
gv pt appt schedule for may. central will contact pt mri (soon/[facility]) and ct for may. pt aware.

## 2013-10-10 ENCOUNTER — Encounter: Payer: Self-pay | Admitting: Surgery

## 2013-10-13 ENCOUNTER — Ambulatory Visit: Payer: Medicare Other | Admitting: Surgery

## 2013-10-13 ENCOUNTER — Ambulatory Visit (HOSPITAL_COMMUNITY)
Admission: RE | Admit: 2013-10-13 | Discharge: 2013-10-13 | Disposition: A | Discharge status: Home / Self Care | Payer: Medicare Other | Source: Ambulatory Visit | Attending: Surgery | Admitting: Surgery

## 2013-10-13 ENCOUNTER — Ambulatory Visit (INDEPENDENT_AMBULATORY_CARE_PROVIDER_SITE_OTHER): Payer: Medicare Other | Admitting: Surgery

## 2013-10-13 ENCOUNTER — Encounter: Payer: Self-pay | Admitting: Surgery

## 2013-10-13 VITALS — BP 161/94 | HR 91 | Ht 65.0 in | Wt 158.4 lb

## 2013-10-13 DIAGNOSIS — I714 Abdominal aortic aneurysm, without rupture, unspecified: Secondary | ICD-10-CM | POA: Insufficient documentation

## 2013-10-13 NOTE — Addendum Note (Signed)
Addended by: Dorthula Rue L on: 10/13/2013 12:26 PM   Modules accepted: Orders

## 2013-10-13 NOTE — Progress Notes (Signed)
Patient name: Jordan Mccullough MRN: [id number] DOB: 1941/06/03 Sex: male     Chief Complaint  Patient presents with  . Re-evaluation    6 month f/u     HISTORY OF PRESENT ILLNESS: The patient is back today for followup.  During a workup for his abdominal aortic aneurysm, I obtained a chest CT scan which revealed a left-sided lung mass.  This ultimately turned out to be a malignancy.  He has undergone surgery, and chemotherapy.  He is contemplating radiation.  He seems to be in good spirits today and is painful that we found his cancer early.  He denies any abdominal pain.  He is recovering from his chemotherapy which she has now completed.  Past Medical History  Diagnosis Date  . Hyperlipidemia   . Diverticulosis   . Coronary artery disease     MI 1992, S/P  PTCA; negative stress test in November 2011 with no ischemia.   . Peripheral vascular disease 1/14    4.8x4.6 cm infrarenal abdominal aortic fusiform aneurysm, 1.5 cm right common iliac artery aneurysm  . AAA (abdominal aortic aneurysm)   . Hearing loss     Bilateral   . Myocardial infarction 1992    Dr Angelena Form    . Lung cancer 10-14    non-small cell lung cancer    Past Surgical History  Procedure Laterality Date  . Ptca  1992  . Inguinal heriiorrhaphy bilaterally    . Colonoscopy w/ polypectomy  2003     negative 2010,due 2020; Dr Olevia Perches  . Pilonidal cyst excision    . Rotator cuff repair      Bilateral  . Cystoscopy/retrograde/ureteroscopy Bilateral 10/09/2012    Procedure: BILATERAL RETROGRADE bladder and urethral BIOPSY ;  Surgeon: Molli Hazard, MD;  Location: [facility];  Service: Urology;  Laterality: Bilateral;  BILATERAL RETROGRADE  AND bladder and urethral BIOPSY    . Prostate biopsy N/A 10/09/2012    Procedure: PROSTATIC URETHRAL BIOPSY;  Surgeon: Molli Hazard, MD;  Location: [facility];  Service: Urology;  Laterality: N/A;  PROSTATIC URETHRAL BIOPSY    . Hernia repair Bilateral     Inguinal    . Mediastinoscopy N/A 05/01/2013    Procedure: MEDIASTINOSCOPY;  Surgeon: Gaye Pollack, MD;  Location: [facility] OR;  Service: Thoracic;  Laterality: N/A;  . Video bronchoscopy N/A 05/01/2013    Procedure: VIDEO BRONCHOSCOPY;  Surgeon: Gaye Pollack, MD;  Location: [facility] OR;  Service: Thoracic;  Laterality: N/A;  . Coronary angioplasty      no stents  . Thorocotomy with lobectomy Left 05/29/2013    Procedure: LEFT THOROCOTOMY WITH LEFT UPPER LOBE LOBECTOMY;  Surgeon: Gaye Pollack, MD;  Location: [place];  Service: Thoracic;  Laterality: Left;    History   Social History  . Marital Status: Married    Spouse Name: N/A    Number of Children: N/A  . Years of Education: N/A   Occupational History  . Retired Risk analyst    Social History Main Topics  . Smoking status: Former Smoker -- 2.00 packs/day for 24 years    Types: Cigarettes, Cigars    Quit date: 05/28/2013  . Smokeless tobacco: Former Systems developer    Quit date: 05/21/2013     Comment: QUIT 15 YEARS AGO  . Alcohol Use: 5.4 oz/week    3 Glasses of wine, 6 Cans of beer per week     Comment: daily  . Drug Use: No  . Sexual  Activity: Not on file   Other Topics Concern  . Not on file   Social History Narrative   HEART HEALTHY DIET   RETIRED   MARRIED   FORMER SMOKER QUIT 1992   ALCOHOL USE -YES- RED WINE SOCIALLY   SMOKING -QUIT    Family History  Problem Relation Age of Onset  . Hypertension Mother   . Prostate cancer Father     Prostate cancer  . Cancer Father   . Lung cancer Sister     NON SMOKER  . Cancer Sister   . Hyperlipidemia Sister   . Diabetes Neg Hx   . Stroke Neg Hx   . Hyperlipidemia Brother   . Heart attack Brother   . Hypertension Brother     Allergies as of 10/13/2013  . (No Known Allergies)    Current Outpatient Prescriptions on File Prior to Visit  Medication Sig Dispense Refill  . aspirin EC 81 MG tablet Take 81 mg by mouth daily.      . Glucosamine-Chondroit-Vit C-Mn (GLUCOSAMINE 1500  COMPLEX PO) Take by mouth.      . Multiple Vitamin (MULTIVITAMIN) tablet Take 1 tablet by mouth every evening.       . rosuvastatin (CRESTOR) 40 MG tablet Take 1 tablet (40 mg total) by mouth daily.  30 tablet  2   No current facility-administered medications on file prior to visit.     REVIEW OF SYSTEMS: Cardiovascular: No chest pain, chest pressure, palpitations, orthopnea, or dyspnea on exertion. No claudication or rest pain,  No history of DVT or phlebitis. Pulmonary: No productive cough, asthma or wheezing. Neurologic: No weakness, paresthesias, aphasia, or amaurosis. No dizziness. Hematologic: No bleeding problems or clotting disorders. Musculoskeletal: No joint pain or joint swelling. Gastrointestinal: No blood in stool or hematemesis Genitourinary: No dysuria or hematuria. Psychiatric:: No history of major depression. Integumentary: No rashes or ulcers. Constitutional: No fever or chills.  PHYSICAL EXAMINATION:   Vital signs are BP 161/94  Pulse 91  Ht 5\' 5"  (1.651 m)  Wt 158 lb 6.4 oz (71.85 kg)  BMI 26.36 kg/m2  SpO2 97% General: The patient appears their stated age. HEENT:  No gross abnormalities Pulmonary:  Non labored breathing Abdomen: Soft and non-tender Musculoskeletal: There are no major deformities. Neurologic: No focal weakness or paresthesias are detected, Skin: There are no ulcer or rashes noted. Psychiatric: The patient has normal affect. Cardiovascular: There is a regular rate and rhythm without significant murmur appreciated.  Palpable femoral pulses.  No carotid bruits.   Diagnostic Studies I ordered and reviewed his abdominal ultrasound today.  This shows a 4.7 x 5 cm aneurysm.  Assessment: Abdominal aortic aneurysm Plan: By ultrasound, he has grown approximately 2 mm in the past 6 months, maximum diameter is now 5 cm.  The patient is still contemplating whether or not to proceed with radiation therapy for his lung cancer.  Given what he has gone  through over the course of the past 6 months, coupled with the fact that he may be considering radiation therapy, I do not feel that this is the appropriate time to repair his aneurysm.  I have discussed with him that he has a small risk for rupture.  I recommended followup in 6 months with a CT scan.  I reviewed his Doppler study 6 months ago which showed no stenosis bilaterally.  I'll see him back in 6 months.  Eldridge Abrahams, M.D. [facility] Office: [phone] Pager:  [phone]   

## 2013-10-17 ENCOUNTER — Ambulatory Visit (HOSPITAL_COMMUNITY): Admission: RE | Admit: 2013-10-17 | Payer: Medicare Other | Source: Ambulatory Visit

## 2013-10-17 ENCOUNTER — Encounter: Payer: Self-pay | Admitting: Internal Medicine

## 2013-10-17 ENCOUNTER — Ambulatory Visit (HOSPITAL_COMMUNITY)
Admission: RE | Admit: 2013-10-17 | Discharge: 2013-10-17 | Disposition: A | Discharge status: Home / Self Care | Payer: Medicare Other | Source: Ambulatory Visit | Attending: Internal Medicine | Admitting: Internal Medicine

## 2013-10-17 ENCOUNTER — Ambulatory Visit (INDEPENDENT_AMBULATORY_CARE_PROVIDER_SITE_OTHER): Payer: Medicare Other | Admitting: Internal Medicine

## 2013-10-17 ENCOUNTER — Other Ambulatory Visit: Payer: Self-pay | Admitting: Internal Medicine

## 2013-10-17 VITALS — BP 138/68 | HR 89 | Temp 98.1°F | Wt 158.4 lb

## 2013-10-17 DIAGNOSIS — C3490 Malignant neoplasm of unspecified part of unspecified bronchus or lung: Secondary | ICD-10-CM

## 2013-10-17 DIAGNOSIS — L820 Inflamed seborrheic keratosis: Secondary | ICD-10-CM

## 2013-10-17 DIAGNOSIS — R42 Dizziness and giddiness: Secondary | ICD-10-CM | POA: Insufficient documentation

## 2013-10-17 DIAGNOSIS — R51 Headache: Secondary | ICD-10-CM | POA: Insufficient documentation

## 2013-10-17 MED ORDER — MUPIROCIN 2 % EX OINT: TOPICAL_OINTMENT | CUTANEOUS | Status: DC

## 2013-10-17 MED ORDER — GADOBENATE DIMEGLUMINE 529 MG/ML IV SOLN
14.0000 mL | Freq: Once | INTRAVENOUS | Status: AC | PRN
  Administered 2013-10-17: 14 mL via INTRAVENOUS

## 2013-10-17 NOTE — Progress Notes (Signed)
Pre visit review using our clinic review tool, if applicable. No additional management support is needed unless otherwise documented below in the visit note. 

## 2013-10-17 NOTE — Patient Instructions (Signed)
The  Dermatology referral will be scheduled and you'll be notified of the time.

## 2013-10-17 NOTE — Progress Notes (Signed)
   Subjective:    Patient ID: Jordan Mccullough, male    DOB: May 02, 1941, 73 y.o.   MRN: [id number]  HPI   He has had a seborrheic keratotic lesion in the left axillary area for years. Over the last 2 weeks it's been itching and has gotten red. He has used Neosporin with some improvement in the erythema.  He has no past history of squamous cell, basal cell, or melanoma lesions      Review of Systems  He denies fever, chills, sweats, weight loss.      Objective:   Physical Exam  He appears well-nourished in no distress  No lymphadenopathy about the neck or axilla.  He has an 8 x 9 mm seborrheic keratosis with erythema at the superior aspect.  He has an inspissated epidermoid inclusion cyst of the left upper back which is not inflamed        Assessment & Plan:  #1 inflamed seborrheic keratosis  Plan: See orders

## 2013-10-20 ENCOUNTER — Telehealth: Payer: Self-pay | Admitting: Medical Oncology

## 2013-10-20 NOTE — Telephone Encounter (Signed)
Wants MRI results 

## 2013-10-20 NOTE — Telephone Encounter (Signed)
Per Dr Julien Nordmann I gave pt his MRI results.

## 2013-10-27 NOTE — Progress Notes (Signed)
Thoracic Location of Tumor / Histology: Left upper lung   Patient presented  months ago with symptoms of:   Biopsies of  (if applicable) revealed: Diagnosis 05/29/13:1. Lymph node, biopsy, AP, left upper- METASTATIC ADENOCARCINOMA (1/1)2. Lymph node, biopsy, Left hilar - METASTATIC ADENOCARCINOMA (1/1)3. Lymph node, biopsy, Left hilar #2- METASTATIC ADENOCARCINOMA (1/1)4. Lung, resection (segmental or lobe), Left upper- ADENOCARCINOMA, 1.1 CM.- ADENOCARCINOMA FOCALLY INVOLVE VISCERAL PLEURA.- LYMPHOVASCULAR INVOLVEMENT BY TUMOR.- METASTATIC ADENOCARCINOMA IN ONE OF THREE HILAR LYMPH NODES (1/3)    Tobacco/Marijuana/Snuff/ETOH use: cigarettes and Cigars,  Quit smoking,and quit smokless tobacco, drinks 1.8 oz liquor weekly,no illicit drugs  Past/Anticipated interventions by cardiothoracic surgery, if any: Preoperative Diagnosis:  05/29/13: Left upper lobe lung nodule with left hilar and aorto-pulmonary window adenopathy  Postoperative Diagnosis: Non-small cell lung cancer with metastases to aorto-pulmonary window lymph nodes.  Procedure: 1. Left muscle-sparing thoracotomy 2. Left upper lobectomy 3. Mediastinal lymph node dissection Dr.Bryan Bartle, f/u 06/06/13   Past/Anticipated interventions by medical oncology, if any: 10/09/13: He is status post 4 cycles of adjuvant chemotherapy with cisplatin 75 mg per meter squared and Alimta 500 mg per meter squared given every 3 weeks.  He tolerated his treatment fairly well. Dr.Mohamed, Ct Chest 01/01/14, f/u appt 01/08/14 Dr.Mohamed   Signs/Symptoms  Weight changes, if any:   Respiratory complaints, if any: sob with moderate exertion  Hemoptysis, if any: No  Pain issues, if any:  No  SAFETY ISSUES:No  Prior radiation? No  Pacemaker/ICD? No  Possible current pregnancy? No  Is the patient on methotrexate? No  Current Complaints / other details:  Seen Lung Clinic 06/26/13, married, 2 children, father brain cancer, deceased 56,  sister lung  cancer deceased 63 non smoker,

## 2013-10-29 ENCOUNTER — Ambulatory Visit
Admission: RE | Admit: 2013-10-29 | Discharge: 2013-10-29 | Disposition: A | Discharge status: Home / Self Care | Payer: Medicare Other | Source: Ambulatory Visit | Attending: Radiation Oncology | Admitting: Radiation Oncology

## 2013-10-29 ENCOUNTER — Encounter: Payer: Self-pay | Admitting: Radiation Oncology

## 2013-10-29 VITALS — BP 153/90 | HR 83 | Temp 98.3°F | Resp 20 | Ht 65.0 in | Wt 161.6 lb

## 2013-10-29 DIAGNOSIS — C3412 Malignant neoplasm of upper lobe, left bronchus or lung: Secondary | ICD-10-CM | POA: Insufficient documentation

## 2013-10-29 DIAGNOSIS — Z9221 Personal history of antineoplastic chemotherapy: Secondary | ICD-10-CM | POA: Insufficient documentation

## 2013-10-29 DIAGNOSIS — C3410 Malignant neoplasm of upper lobe, unspecified bronchus or lung: Secondary | ICD-10-CM

## 2013-10-29 DIAGNOSIS — R51 Headache: Secondary | ICD-10-CM | POA: Insufficient documentation

## 2013-10-29 NOTE — Progress Notes (Signed)
Please see the Nurse Progress Note in the MD Initial Consult Encounter for this patient. 

## 2013-10-29 NOTE — Progress Notes (Signed)
Radiation Oncology         [phone] ________________________________  Name: Jordan Mccullough MRN: [id number]  Date: 10/29/2013  DOB: [birth year]  Follow-Up Visit Note  CC: [name], MD  [name], MD  Diagnosis:   No carcinoma of the left upper lobe, stage III a    Narrative:  The patient returns today for routine follow-up.  The patient has proceeded with postoperative chemotherapy. He states that he did relatively well with this. Some nausea and some fatigue but he has been recovering from this. The patient presents today for further discussion of possible postoperative radiation treatment which was discussed with him previously given his regional nodal disease which was seen pathologically. The patient delayed getting back to see [place] in clinic somewhat due to his fatigue towards the end of chemotherapy. The patient has undergone imaging after chemotherapy as well. An MRI scan was completed on 10/18/2011. This shows no evidence of metastatic disease. The patient did have some headaches and therefore was sent for a repeat MRI scan of the brain. He also had a CT scan of the chest which did not find any evidence for recurrent or metastatic disease.                              ALLERGIES:  has No Known Allergies.  Meds: Current Outpatient Prescriptions  Medication Sig Dispense Refill  . aspirin EC 81 MG tablet Take 81 mg by mouth daily.      . Glucosamine-Chondroit-Vit C-Mn (GLUCOSAMINE 1500 COMPLEX PO) Take 1,500 mg by mouth daily.       . Multiple Vitamin (MULTIVITAMIN) tablet Take 1 tablet by mouth every evening.       . rosuvastatin (CRESTOR) 40 MG tablet Take 1 tablet (40 mg total) by mouth daily.  30 tablet  2   No current facility-administered medications for this encounter.    Physical Findings: The patient is in no acute distress. Patient is alert and oriented.  height is 5\' 5"  (1.651 m) and weight is 161 lb 9.6 oz (73.301 kg). His oral temperature is 98.3 F (36.8  C). His blood pressure is 153/90 and his pulse is 83. His respiration is 20 and oxygen saturation is 98%. .   General: Well-developed, in no acute distress HEENT: Normocephalic, atraumatic Cardiovascular: Regular rate and rhythm Respiratory: Clear to auscultation bilaterally GI: Soft, nontender, normal bowel sounds Extremities: No edema present   Lab Findings: Lab Results  Component Value Date   WBC 4.7 10/07/2013   HGB 12.0* 10/07/2013   HCT 35.6* 10/07/2013   MCV 91.3 10/07/2013   PLT 275 10/07/2013     Radiographic Findings: Ct Chest W Contrast  10/07/2013   CLINICAL DATA:  Lung cancer.  EXAM: CT CHEST WITH CONTRAST  TECHNIQUE: Multidetector CT imaging of the chest was performed during intravenous contrast administration.  CONTRAST:  42mL OMNIPAQUE IOHEXOL 300 MG/ML  SOLN  COMPARISON:  04/14/2013  FINDINGS: The chest wall is unremarkable and stable. No supraclavicular or axillary mass or adenopathy. The thyroid gland appears normal. The bony thorax is intact. No destructive bone lesions or spinal canal compromise.  The heart is normal in size. No pericardial effusion. No mediastinal or hilar mass or adenopathy. Small scattered lymph nodes are stable. Stable surgical changes from a probable left upper lobe lobectomy. The aorta is normal in caliber. No dissection. The branch vessels are patent. The esophagus is normal.  Examination of the  lung parenchyma demonstrates no acute pulmonary findings. No worrisome pulmonary nodules or lesions. No pleural effusion.  The upper abdomen is unremarkable. Renal cysts are noted. No adrenal gland lesions. Chololithiasis.  IMPRESSION: Status post left upper lobe lobectomy with resection of left upper lobe lung cancer. No findings for recurrent disease, adenopathy or metastatic disease.   Electronically Signed   By: Kalman Jewels M.D.   On: 10/07/2013 12:56   Mr Jeri Cos GM Contrast  10/17/2013   CLINICAL DATA:  Lung cancer.  Restaging.  Headache and  dizziness.  EXAM: MRI HEAD WITHOUT AND WITH CONTRAST  TECHNIQUE: Multiplanar, multiecho pulse sequences of the brain and surrounding structures were obtained without and with intravenous contrast.  CONTRAST:  51mL MULTIHANCE GADOBENATE DIMEGLUMINE 529 MG/ML IV SOLN  COMPARISON:  05/16/2013  FINDINGS: Diffusion imaging does not show any acute or subacute infarction. There are mild chronic small-vessel changes affecting the cerebral hemispheric white matter. No cortical or large vessel territory infarction. No primary or metastatic mass lesion. No hemorrhage or hydrocephalus. No extra-axial collection. No pituitary mass. No inflammatory sinus disease. No skull or skullbase lesion.  IMPRESSION: No evidence of metastatic disease. No acute finding. Mild chronic small-vessel change of the cerebral hemispheric white matter.   Electronically Signed   By: Nelson Chimes M.D.   On: 10/17/2013 15:40    Impression:    The patient has done well with chemotherapy and he now is suitable to proceed with postoperative radiation treatment given his regional nodal disease from surgery.  I discussed with the patient the rationale for radiation treatment at this point. We discussed in detail the potential side effects and risks of treatment as well. The patient had a number of questions in this regard. We discussed the potential 5 week course of radiation treatment.  Plan:  The patient will proceed with a simulation in the near future such that we can begin treatment planning.   Jodelle Gross, M.D., Ph.D.

## 2013-10-31 ENCOUNTER — Ambulatory Visit
Admission: RE | Admit: 2013-10-31 | Discharge: 2013-10-31 | Disposition: A | Discharge status: Home / Self Care | Payer: Medicare Other | Source: Ambulatory Visit | Attending: Radiation Oncology | Admitting: Radiation Oncology

## 2013-10-31 DIAGNOSIS — Y842 Radiological procedure and radiotherapy as the cause of abnormal reaction of the patient, or of later complication, without mention of misadventure at the time of the procedure: Secondary | ICD-10-CM | POA: Insufficient documentation

## 2013-10-31 DIAGNOSIS — K208 Other esophagitis without bleeding: Secondary | ICD-10-CM | POA: Insufficient documentation

## 2013-10-31 DIAGNOSIS — Z7982 Long term (current) use of aspirin: Secondary | ICD-10-CM | POA: Insufficient documentation

## 2013-10-31 DIAGNOSIS — Z510 Encounter for antineoplastic radiation therapy: Secondary | ICD-10-CM | POA: Insufficient documentation

## 2013-10-31 DIAGNOSIS — Z79899 Other long term (current) drug therapy: Secondary | ICD-10-CM | POA: Insufficient documentation

## 2013-10-31 DIAGNOSIS — C3410 Malignant neoplasm of upper lobe, unspecified bronchus or lung: Secondary | ICD-10-CM

## 2013-10-31 NOTE — Addendum Note (Signed)
Encounter addended by: Rebecca Eaton, RN on: 10/31/2013  9:38 AM<BR>     Documentation filed: Charges VN

## 2013-11-07 ENCOUNTER — Ambulatory Visit
Admission: RE | Admit: 2013-11-07 | Discharge: 2013-11-07 | Disposition: A | Discharge status: Home / Self Care | Payer: Medicare Other | Source: Ambulatory Visit | Attending: Radiation Oncology | Admitting: Radiation Oncology

## 2013-11-07 DIAGNOSIS — C3410 Malignant neoplasm of upper lobe, unspecified bronchus or lung: Secondary | ICD-10-CM

## 2013-11-10 ENCOUNTER — Ambulatory Visit
Admission: RE | Admit: 2013-11-10 | Discharge: 2013-11-10 | Disposition: A | Discharge status: Home / Self Care | Payer: Medicare Other | Source: Ambulatory Visit | Attending: Radiation Oncology | Admitting: Radiation Oncology

## 2013-11-11 ENCOUNTER — Ambulatory Visit
Admission: RE | Admit: 2013-11-11 | Discharge: 2013-11-11 | Disposition: A | Discharge status: Home / Self Care | Payer: Medicare Other | Source: Ambulatory Visit | Attending: Radiation Oncology | Admitting: Radiation Oncology

## 2013-11-11 DIAGNOSIS — C3410 Malignant neoplasm of upper lobe, unspecified bronchus or lung: Secondary | ICD-10-CM

## 2013-11-11 MED ORDER — BIAFINE EX EMUL
CUTANEOUS | Status: DC | PRN
  Administered 2013-11-11: 10:00:00 via TOPICAL

## 2013-11-11 NOTE — Progress Notes (Signed)
Patient education done, biafine cream and radiatin therapy and you book given to patient, discussed pain, fatigue, skin irritation, throat changes, shortness breath, difficulty swallowing, diet, increase protein, stay hydrated,  Hair loss at treatment, area, coghing, my business card given, ,teach back given

## 2013-11-12 ENCOUNTER — Ambulatory Visit
Admission: RE | Admit: 2013-11-12 | Discharge: 2013-11-12 | Disposition: A | Discharge status: Home / Self Care | Payer: Medicare Other | Source: Ambulatory Visit | Attending: Radiation Oncology | Admitting: Radiation Oncology

## 2013-11-13 ENCOUNTER — Ambulatory Visit
Admission: RE | Admit: 2013-11-13 | Discharge: 2013-11-13 | Disposition: A | Discharge status: Home / Self Care | Payer: Medicare Other | Source: Ambulatory Visit | Attending: Radiation Oncology | Admitting: Radiation Oncology

## 2013-11-14 ENCOUNTER — Ambulatory Visit
Admission: RE | Admit: 2013-11-14 | Discharge: 2013-11-14 | Disposition: A | Discharge status: Home / Self Care | Payer: Medicare Other | Source: Ambulatory Visit | Attending: Radiation Oncology | Admitting: Radiation Oncology

## 2013-11-14 ENCOUNTER — Encounter: Payer: Self-pay | Admitting: Radiation Oncology

## 2013-11-14 VITALS — BP 150/99 | HR 90 | Temp 97.9°F | Resp 20 | Wt 158.4 lb

## 2013-11-14 DIAGNOSIS — C3410 Malignant neoplasm of upper lobe, unspecified bronchus or lung: Secondary | ICD-10-CM

## 2013-11-14 NOTE — Progress Notes (Signed)
  Radiation Oncology         [phone]) [phone] ________________________________  Name: Jordan Mccullough MRN: [id number]  Date: 10/31/2013  DOB: [birth year]  SIMULATION AND TREATMENT PLANNING NOTE  DIAGNOSIS:  Lung cancer   Site:  Lungs/mediastinum  NARRATIVE:  The patient was brought to the [facility].  Identity was confirmed.  All relevant records and images related to the planned course of therapy were reviewed.   Written consent to proceed with treatment was confirmed which was freely given after reviewing the details related to the planned course of therapy had been reviewed with the patient.  Then, the patient was set-up in a stable reproducible  supine position for radiation therapy.  CT images were obtained.  Surface markings were placed.    Medically necessary complex treatment device(s) for immobilization:   Wing board.   The CT images were loaded into the planning software.  Then the target and avoidance structures were contoured.  Treatment planning then occurred.  The radiation prescription was entered and confirmed.  A total of 4 complex treatment devices were fabricated which relate to the designed radiation treatment fields. Each of these customized fields/ complex treatment devices will be used on a daily basis during the radiation course. I have requested : {C3D Simulation  I have requested a DVH of the following structures: Target volume, spinal cord, lungs, esophagus  The patient will undergo daily image guidance to ensure accurate localization of the target, and adequate minimize dose to the normal surrounding structures in close proximity to the target.  PLAN:  The patient will receive 50 Gy in 25 fractions.  ________________________________   Jodelle Gross, MD, PhD

## 2013-11-14 NOTE — Progress Notes (Signed)
   Department of Radiation Oncology  Phone:  [phone] Fax:        [phone]  Weekly Treatment Note    Name: Jordan Mccullough Date: 11/14/2013 MRN: [id number] DOB: 1941/01/16   Current dose: 10 Gy  Current fraction: 5   MEDICATIONS: Current Outpatient Prescriptions  Medication Sig Dispense Refill  . aspirin EC 81 MG tablet Take 81 mg by mouth daily.      Marland Kitchen emollient (BIAFINE) cream Apply 1 application topically 2 (two) times daily. Apply after radiation to chest area and bedtime daily      . Glucosamine-Chondroit-Vit C-Mn (GLUCOSAMINE 1500 COMPLEX PO) Take 1,500 mg by mouth daily.       . Multiple Vitamin (MULTIVITAMIN) tablet Take 1 tablet by mouth every evening.       . rosuvastatin (CRESTOR) 40 MG tablet Take 1 tablet (40 mg total) by mouth daily.  30 tablet  2   No current facility-administered medications for this encounter.     ALLERGIES: Review of patient's allergies indicates no known allergies.   LABORATORY DATA:  Lab Results  Component Value Date   WBC 4.7 10/07/2013   HGB 12.0* 10/07/2013   HCT 35.6* 10/07/2013   MCV 91.3 10/07/2013   PLT 275 10/07/2013   Lab Results  Component Value Date   NA 141 10/07/2013   K 5.1 10/07/2013   CL 101 10/07/2013   CO2 27 10/07/2013   Lab Results  Component Value Date   ALT 26 10/07/2013   AST 30 10/07/2013   ALKPHOS 67 10/07/2013   BILITOT 0.3 10/07/2013     NARRATIVE: Jordan Mccullough was seen today for weekly treatment management. The chart was checked and the patient's films were reviewed. The patient is doing well after his first week of treatment. No esophagitis. No shortness of breath.  PHYSICAL EXAMINATION: weight is 158 lb 6.4 oz (71.85 kg). His oral temperature is 97.9 F (36.6 C). His blood pressure is 150/99 and his pulse is 90. His respiration is 20 and oxygen saturation is 99%.        ASSESSMENT: The patient is doing satisfactorily with treatment.  PLAN: We will continue with the patient's radiation  treatment as planned.

## 2013-11-14 NOTE — Progress Notes (Signed)
Weekly rad tx 5 completed  Chest, no c/o pain,nausea, coughing,sob, no difficulty swallowing, will start using biafine today, appetite good,  No fatigue, just c/o post nasal drip 10:02 AM

## 2013-11-17 ENCOUNTER — Ambulatory Visit
Admission: RE | Admit: 2013-11-17 | Discharge: 2013-11-17 | Disposition: A | Discharge status: Home / Self Care | Payer: Medicare Other | Source: Ambulatory Visit | Attending: Radiation Oncology | Admitting: Radiation Oncology

## 2013-11-18 ENCOUNTER — Ambulatory Visit
Admission: RE | Admit: 2013-11-18 | Discharge: 2013-11-18 | Disposition: A | Discharge status: Home / Self Care | Payer: Medicare Other | Source: Ambulatory Visit | Attending: Radiation Oncology | Admitting: Radiation Oncology

## 2013-11-19 ENCOUNTER — Encounter: Payer: Self-pay | Admitting: Radiation Oncology

## 2013-11-19 ENCOUNTER — Ambulatory Visit
Admission: RE | Admit: 2013-11-19 | Discharge: 2013-11-19 | Disposition: A | Discharge status: Home / Self Care | Payer: Medicare Other | Source: Ambulatory Visit | Attending: Radiation Oncology | Admitting: Radiation Oncology

## 2013-11-19 VITALS — BP 140/89 | HR 93 | Temp 98.7°F | Resp 20 | Wt 158.2 lb

## 2013-11-19 DIAGNOSIS — C3410 Malignant neoplasm of upper lobe, unspecified bronchus or lung: Secondary | ICD-10-CM

## 2013-11-19 NOTE — Progress Notes (Addendum)
Weekly rad txs, 8 completed chest, slight erythema top of chest, using biafine bid, no c/p pain, nausea, no difficulty swallowing, no fatigue, no sob 98% room air sats .bow

## 2013-11-19 NOTE — Progress Notes (Signed)
   Weekly Management Note:  outpatient Current Dose:  16 Gy  Projected Dose: 50 Gy initial  Narrative:  The patient presents for routine under treatment assessment.  CBCT/MVCT images/Port film x-rays were reviewed.  The chart was checked.  No new complaints. Feels well. No new esophageal symptoms.  Physical Findings:  weight is 158 lb 3.2 oz (71.759 kg). His oral temperature is 98.7 F (37.1 C). His blood pressure is 140/89 and his pulse is 93. His respiration is 20 and oxygen saturation is 98%.  well appearing, NAD.  Impression:  The patient is tolerating radiotherapy.  Plan:  Continue radiotherapy as planned.   ________________________________   Eppie Gibson, M.D.

## 2013-11-20 ENCOUNTER — Ambulatory Visit
Admission: RE | Admit: 2013-11-20 | Discharge: 2013-11-20 | Disposition: A | Discharge status: Home / Self Care | Payer: Medicare Other | Source: Ambulatory Visit | Attending: Radiation Oncology | Admitting: Radiation Oncology

## 2013-11-21 ENCOUNTER — Ambulatory Visit
Admission: RE | Admit: 2013-11-21 | Discharge: 2013-11-21 | Disposition: A | Discharge status: Home / Self Care | Payer: Medicare Other | Source: Ambulatory Visit | Attending: Radiation Oncology | Admitting: Radiation Oncology

## 2013-11-23 ENCOUNTER — Encounter: Payer: Self-pay | Admitting: Internal Medicine

## 2013-11-24 ENCOUNTER — Ambulatory Visit
Admission: RE | Admit: 2013-11-24 | Discharge: 2013-11-24 | Disposition: A | Discharge status: Home / Self Care | Payer: Medicare Other | Source: Ambulatory Visit | Attending: Radiation Oncology | Admitting: Radiation Oncology

## 2013-11-24 ENCOUNTER — Other Ambulatory Visit: Payer: Self-pay | Admitting: Internal Medicine

## 2013-11-24 DIAGNOSIS — L820 Inflamed seborrheic keratosis: Secondary | ICD-10-CM

## 2013-11-25 ENCOUNTER — Ambulatory Visit
Admission: RE | Admit: 2013-11-25 | Discharge: 2013-11-25 | Disposition: A | Discharge status: Home / Self Care | Payer: Medicare Other | Source: Ambulatory Visit | Attending: Radiation Oncology | Admitting: Radiation Oncology

## 2013-11-25 ENCOUNTER — Encounter: Payer: Self-pay | Admitting: Internal Medicine

## 2013-11-26 ENCOUNTER — Ambulatory Visit
Admission: RE | Admit: 2013-11-26 | Discharge: 2013-11-26 | Disposition: A | Discharge status: Home / Self Care | Payer: Medicare Other | Source: Ambulatory Visit | Attending: Radiation Oncology | Admitting: Radiation Oncology

## 2013-11-27 ENCOUNTER — Ambulatory Visit
Admission: RE | Admit: 2013-11-27 | Discharge: 2013-11-27 | Disposition: A | Discharge status: Home / Self Care | Payer: Medicare Other | Source: Ambulatory Visit | Attending: Radiation Oncology | Admitting: Radiation Oncology

## 2013-11-28 ENCOUNTER — Ambulatory Visit
Admission: RE | Admit: 2013-11-28 | Discharge: 2013-11-28 | Disposition: A | Discharge status: Home / Self Care | Payer: Medicare Other | Source: Ambulatory Visit | Attending: Radiation Oncology | Admitting: Radiation Oncology

## 2013-11-28 ENCOUNTER — Encounter: Payer: Self-pay | Admitting: Radiation Oncology

## 2013-11-28 VITALS — BP 137/89 | HR 91 | Temp 98.9°F | Resp 20 | Wt 156.9 lb

## 2013-11-28 DIAGNOSIS — C3410 Malignant neoplasm of upper lobe, unspecified bronchus or lung: Secondary | ICD-10-CM

## 2013-11-28 MED ORDER — SUCRALFATE 1 G PO TABS: 1.0000 g | ORAL_TABLET | Freq: Four times a day (QID) | ORAL | Status: DC

## 2013-11-28 NOTE — Progress Notes (Signed)
wekly rad txs, 15 compleetd chest, slight erythema, skin intqact, biafine bid, has some heartburn acid reflux when drinking coffee, any hot liquids,  And spicy foods, discourage d coffe and spicy foods for now, drinks 2 bottle pf water daily, no fatigue or pain, no nausea or diff swallowing at present 10:17 AM

## 2013-11-28 NOTE — Progress Notes (Signed)
   Department of Radiation Oncology  Phone:  [phone] Fax:        [phone]  Weekly Treatment Note    Name: Jordan Mccullough Date: 11/28/2013 MRN: [id number] DOB: 1940/09/25   Current dose: 30 Gy  Current fraction: 15   MEDICATIONS: Current Outpatient Prescriptions  Medication Sig Dispense Refill  . aspirin EC 81 MG tablet Take 81 mg by mouth daily.      Marland Kitchen emollient (BIAFINE) cream Apply 1 application topically 2 (two) times daily. Apply after radiation to chest area and bedtime daily      . Glucosamine-Chondroit-Vit C-Mn (GLUCOSAMINE 1500 COMPLEX PO) Take 1,500 mg by mouth daily.       . Multiple Vitamin (MULTIVITAMIN) tablet Take 1 tablet by mouth every evening.       . rosuvastatin (CRESTOR) 40 MG tablet Take 1 tablet (40 mg total) by mouth daily.  30 tablet  2   No current facility-administered medications for this encounter.     ALLERGIES: Review of patient's allergies indicates no known allergies.   LABORATORY DATA:  Lab Results  Component Value Date   WBC 4.7 10/07/2013   HGB 12.0* 10/07/2013   HCT 35.6* 10/07/2013   MCV 91.3 10/07/2013   PLT 275 10/07/2013   Lab Results  Component Value Date   NA 141 10/07/2013   K 5.1 10/07/2013   CL 101 10/07/2013   CO2 27 10/07/2013   Lab Results  Component Value Date   ALT 26 10/07/2013   AST 30 10/07/2013   ALKPHOS 67 10/07/2013   BILITOT 0.3 10/07/2013     NARRATIVE: Jordan Mccullough was seen today for weekly treatment management. The chart was checked and the patient's films were reviewed. The patient is doing fairly well at this time. He is experiencing some early esophagitis. This is his primary complaint today.  PHYSICAL EXAMINATION: weight is 156 lb 14.4 oz (71.169 kg). His oral temperature is 98.9 F (37.2 C). His blood pressure is 137/89 and his pulse is 91. His respiration is 20 and oxygen saturation is 99%.        ASSESSMENT: The patient is doing satisfactorily with treatment.  PLAN: We will continue with  the patient's radiation treatment as planned. The patient has been given a prescription for Carafate. He also will begin using Prilosec.

## 2013-12-01 ENCOUNTER — Ambulatory Visit
Admission: RE | Admit: 2013-12-01 | Discharge: 2013-12-01 | Disposition: A | Discharge status: Home / Self Care | Payer: Medicare Other | Source: Ambulatory Visit | Attending: Radiation Oncology | Admitting: Radiation Oncology

## 2013-12-02 ENCOUNTER — Ambulatory Visit
Admission: RE | Admit: 2013-12-02 | Discharge: 2013-12-02 | Disposition: A | Discharge status: Home / Self Care | Payer: Medicare Other | Source: Ambulatory Visit | Attending: Radiation Oncology | Admitting: Radiation Oncology

## 2013-12-02 ENCOUNTER — Other Ambulatory Visit: Payer: Self-pay

## 2013-12-02 DIAGNOSIS — E785 Hyperlipidemia, unspecified: Secondary | ICD-10-CM

## 2013-12-02 DIAGNOSIS — I252 Old myocardial infarction: Secondary | ICD-10-CM

## 2013-12-02 MED ORDER — ROSUVASTATIN CALCIUM 40 MG PO TABS: 40.0000 mg | ORAL_TABLET | Freq: Every day | ORAL | Status: DC

## 2013-12-03 ENCOUNTER — Ambulatory Visit
Admission: RE | Admit: 2013-12-03 | Discharge: 2013-12-03 | Disposition: A | Discharge status: Home / Self Care | Payer: Medicare Other | Source: Ambulatory Visit | Attending: Radiation Oncology | Admitting: Radiation Oncology

## 2013-12-04 ENCOUNTER — Ambulatory Visit
Admission: RE | Admit: 2013-12-04 | Discharge: 2013-12-04 | Disposition: A | Discharge status: Home / Self Care | Payer: Medicare Other | Source: Ambulatory Visit | Attending: Radiation Oncology | Admitting: Radiation Oncology

## 2013-12-05 ENCOUNTER — Ambulatory Visit
Admission: RE | Admit: 2013-12-05 | Discharge: 2013-12-05 | Disposition: A | Discharge status: Home / Self Care | Payer: Medicare Other | Source: Ambulatory Visit | Attending: Radiation Oncology | Admitting: Radiation Oncology

## 2013-12-05 ENCOUNTER — Encounter: Payer: Self-pay | Admitting: Radiation Oncology

## 2013-12-05 VITALS — BP 150/94 | HR 93 | Temp 98.3°F | Resp 20 | Wt 152.7 lb

## 2013-12-05 DIAGNOSIS — C3410 Malignant neoplasm of upper lobe, unspecified bronchus or lung: Secondary | ICD-10-CM

## 2013-12-05 NOTE — Progress Notes (Signed)
   Department of Radiation Oncology  Phone:  [phone] Fax:        [phone]  Weekly Treatment Note    Name: Jordan Mccullough Date: 12/05/2013 MRN: [id number] DOB: 1941-05-30   Current dose: 40 Gy  Current fraction: 20   MEDICATIONS: Current Outpatient Prescriptions  Medication Sig Dispense Refill  . aspirin EC 81 MG tablet Take 81 mg by mouth daily.      Marland Kitchen emollient (BIAFINE) cream Apply 1 application topically 2 (two) times daily. Apply after radiation to chest area and bedtime daily      . Glucosamine-Chondroit-Vit C-Mn (GLUCOSAMINE 1500 COMPLEX PO) Take 1,500 mg by mouth daily.       . Multiple Vitamin (MULTIVITAMIN) tablet Take 1 tablet by mouth every evening.       . rosuvastatin (CRESTOR) 40 MG tablet Take 1 tablet (40 mg total) by mouth daily.  30 tablet  5  . sucralfate (CARAFATE) 1 G tablet Take 1 tablet (1 g total) by mouth 4 (four) times daily.  120 tablet  2   No current facility-administered medications for this encounter.     ALLERGIES: Review of patient's allergies indicates no known allergies.   LABORATORY DATA:  Lab Results  Component Value Date   WBC 4.7 10/07/2013   HGB 12.0* 10/07/2013   HCT 35.6* 10/07/2013   MCV 91.3 10/07/2013   PLT 275 10/07/2013   Lab Results  Component Value Date   NA 141 10/07/2013   K 5.1 10/07/2013   CL 101 10/07/2013   CO2 27 10/07/2013   Lab Results  Component Value Date   ALT 26 10/07/2013   AST 30 10/07/2013   ALKPHOS 67 10/07/2013   BILITOT 0.3 10/07/2013     NARRATIVE: Jordan Mccullough was seen today for weekly treatment management. The chart was checked and the patient's films were reviewed. The patient is doing very well this week. Carafate medication helped significantly with his esophagitis. He doesn't have significant problems with this currently. He has one more week to finish.  PHYSICAL EXAMINATION: weight is 152 lb 11.2 oz (69.264 kg). His oral temperature is 98.3 F (36.8 C). His blood pressure is 150/94  and his pulse is 93. His respiration is 20 and oxygen saturation is 98%.        ASSESSMENT: The patient is doing satisfactorily with treatment.  PLAN: We will continue with the patient's radiation treatment as planned.

## 2013-12-05 NOTE — Progress Notes (Signed)
Weekly rad txs 20/25 completed, erythema front of chest and mild erythema on back, using biaifine bid, no nausea,sob, still no c/o pain, caraafte has helped a lot stated patient, can swaloow a lot better stated 10:33 AM

## 2013-12-08 ENCOUNTER — Ambulatory Visit
Admission: RE | Admit: 2013-12-08 | Discharge: 2013-12-08 | Disposition: A | Discharge status: Home / Self Care | Payer: Medicare Other | Source: Ambulatory Visit | Attending: Radiation Oncology | Admitting: Radiation Oncology

## 2013-12-09 ENCOUNTER — Ambulatory Visit
Admission: RE | Admit: 2013-12-09 | Discharge: 2013-12-09 | Disposition: A | Discharge status: Home / Self Care | Payer: Medicare Other | Source: Ambulatory Visit | Attending: Radiation Oncology | Admitting: Radiation Oncology

## 2013-12-10 ENCOUNTER — Ambulatory Visit
Admission: RE | Admit: 2013-12-10 | Discharge: 2013-12-10 | Disposition: A | Discharge status: Home / Self Care | Payer: Medicare Other | Source: Ambulatory Visit | Attending: Radiation Oncology | Admitting: Radiation Oncology

## 2013-12-11 ENCOUNTER — Ambulatory Visit
Admission: RE | Admit: 2013-12-11 | Discharge: 2013-12-11 | Disposition: A | Discharge status: Home / Self Care | Payer: Medicare Other | Source: Ambulatory Visit | Attending: Radiation Oncology | Admitting: Radiation Oncology

## 2013-12-12 ENCOUNTER — Ambulatory Visit
Admission: RE | Admit: 2013-12-12 | Discharge: 2013-12-12 | Disposition: A | Discharge status: Home / Self Care | Payer: Medicare Other | Source: Ambulatory Visit | Attending: Radiation Oncology | Admitting: Radiation Oncology

## 2013-12-12 ENCOUNTER — Encounter: Payer: Self-pay | Admitting: Radiation Oncology

## 2013-12-12 VITALS — BP 135/91 | HR 62 | Temp 98.8°F | Ht 65.0 in | Wt 148.8 lb

## 2013-12-12 DIAGNOSIS — C3410 Malignant neoplasm of upper lobe, unspecified bronchus or lung: Secondary | ICD-10-CM

## 2013-12-12 MED ORDER — BIAFINE EX EMUL
Freq: Two times a day (BID) | CUTANEOUS | Status: DC
  Administered 2013-12-12: 10:00:00 via TOPICAL

## 2013-12-12 NOTE — Progress Notes (Signed)
Jordan Mccullough here for his final treatment visit.  He has had 25 fractions to his chest.  He denies pain.  He reports a mild sore throat with swallowing.  He reports that he choked on his Carafate yesterday.  He has been taking the pills without dissolving them in water first.  He reports a cough with a small amount of sputum.  He denies hemoptysis.  He denies shortness of breath.  He has lost 4 lbs since 12/05/13.  He reports a good appetite and said he eats lite while he is working.  The skin on his chest is pink and the skin on his mid back is red.  He is using biafine and requested a refill.  Another tube has been given.

## 2013-12-12 NOTE — Progress Notes (Signed)
  Department of Radiation Oncology  Phone:  [phone] Fax:        [phone]  Weekly Treatment Note    Name: Jordan Mccullough Date: 12/12/2013 MRN: [id number] DOB: 09-18-1940   Current dose: 50 Gy  Current fraction: 25   MEDICATIONS: Current Outpatient Prescriptions  Medication Sig Dispense Refill  . aspirin EC 81 MG tablet Take 81 mg by mouth daily.      Marland Kitchen emollient (BIAFINE) cream Apply 1 application topically 2 (two) times daily. Apply after radiation to chest area and bedtime daily      . Glucosamine-Chondroit-Vit C-Mn (GLUCOSAMINE 1500 COMPLEX PO) Take 1,500 mg by mouth daily.       . Multiple Vitamin (MULTIVITAMIN) tablet Take 1 tablet by mouth every evening.       Marland Kitchen omeprazole (PRILOSEC) 20 MG capsule Take 20 mg by mouth daily.      . rosuvastatin (CRESTOR) 40 MG tablet Take 1 tablet (40 mg total) by mouth daily.  30 tablet  5  . sucralfate (CARAFATE) 1 G tablet Take 1 tablet (1 g total) by mouth 4 (four) times daily.  120 tablet  2   Current Facility-Administered Medications  Medication Dose Route Frequency Provider Last Rate Last Dose  . topical emolient (BIAFINE) emulsion   Topical BID Marye Round, MD         ALLERGIES: Review of patient's allergies indicates no known allergies.   LABORATORY DATA:  Lab Results  Component Value Date   WBC 4.7 10/07/2013   HGB 12.0* 10/07/2013   HCT 35.6* 10/07/2013   MCV 91.3 10/07/2013   PLT 275 10/07/2013   Lab Results  Component Value Date   NA 141 10/07/2013   K 5.1 10/07/2013   CL 101 10/07/2013   CO2 27 10/07/2013   Lab Results  Component Value Date   ALT 26 10/07/2013   AST 30 10/07/2013   ALKPHOS 67 10/07/2013   BILITOT 0.3 10/07/2013     NARRATIVE: Jordan Mccullough was seen today for weekly treatment management. The chart was checked and the patient's films were reviewed. The patient finished his final fraction today. He has done well overall. Continued esophagitis. He has been taking Carafate without resolving  this and we discussed a change in how he takes this medication. This should help. He also will resume taking Prilosec which also has helped.  PHYSICAL EXAMINATION: height is 5\' 5"  (1.651 m) and weight is 148 lb 12.8 oz (67.495 kg). His temperature is 98.8 F (37.1 C). His blood pressure is 135/91 and his pulse is 62. His oxygen saturation is 100%.      radiation dermatitis in the treatment area  ASSESSMENT: The patient did satisfactorily with treatment.  PLAN: The patient will follow-up in our clinic in 1 month.

## 2013-12-15 ENCOUNTER — Telehealth: Payer: Self-pay | Admitting: Internal Medicine

## 2013-12-15 NOTE — Telephone Encounter (Signed)
s.w. pt and advised on 5.21 appt moved to 5.26 due to MD out of the office....pt ok and aware

## 2013-12-24 NOTE — Progress Notes (Signed)
  Radiation Oncology         [phone] ________________________________  Name: Jordan Mccullough MRN: [id number]  Date: 11/07/2013  DOB: [birth year]  Simulation Verification Note   NARRATIVE: The patient was brought to the treatment unit and placed in the planned treatment position. The clinical setup was verified. Then port films were obtained and uploaded to the radiation oncology medical record software.  The treatment beams were carefully compared against the planned radiation fields. The position, location, and shape of the radiation fields was reviewed. The targeted volume of tissue appears to be appropriately covered by the radiation beams. Based on my personal review, I approved the simulation verification. The patient's treatment will proceed as planned.  ________________________________   Jodelle Gross, MD, PhD

## 2013-12-24 NOTE — Progress Notes (Signed)
  Radiation Oncology         [phone] ________________________________  Name: Jordan Mccullough MRN: [id number]  Date: 12/12/2013  DOB: Aug 21, 1941  End of Treatment Note  Diagnosis:   Non small cell lung cancer     Indication for treatment:  Curative       Radiation treatment dates:   11/10/2013 through 12/12/2013  Site/dose:   The patient was treated to the high-risk region within the central chest/mediastinum. The patient was treated with a 3-D conformal technique with daily image guidance. The patient received 50 gray in 25 fractions.  Narrative: The patient tolerated radiation treatment relatively well.   The patient experienced some esophagitis during treatment.  Plan: The patient has completed radiation treatment. The patient will return to radiation oncology clinic for routine followup in one month. I advised the patient to call or return sooner if they have any questions or concerns related to their recovery or treatment. ________________________________  Jodelle Gross, M.D., Ph.D.

## 2013-12-24 NOTE — Addendum Note (Signed)
Encounter addended by: Marye Round, MD on: 12/24/2013  9:24 PM<BR>     Documentation filed: Problem List, Visit Diagnoses, Notes Section

## 2014-01-01 ENCOUNTER — Other Ambulatory Visit (HOSPITAL_BASED_OUTPATIENT_CLINIC_OR_DEPARTMENT_OTHER): Payer: Medicare Other

## 2014-01-01 ENCOUNTER — Ambulatory Visit (HOSPITAL_COMMUNITY)
Admission: RE | Admit: 2014-01-01 | Discharge: 2014-01-01 | Disposition: A | Discharge status: Home / Self Care | Payer: Medicare Other | Source: Ambulatory Visit | Attending: Internal Medicine | Admitting: Internal Medicine

## 2014-01-01 ENCOUNTER — Encounter (HOSPITAL_COMMUNITY): Payer: Self-pay

## 2014-01-01 DIAGNOSIS — C3410 Malignant neoplasm of upper lobe, unspecified bronchus or lung: Secondary | ICD-10-CM

## 2014-01-01 DIAGNOSIS — C3490 Malignant neoplasm of unspecified part of unspecified bronchus or lung: Secondary | ICD-10-CM

## 2014-01-01 LAB — CBC WITH DIFFERENTIAL/PLATELET
BASO%: 0.9 % (ref 0.0–2.0)
Basophils Absolute: 0 10*3/uL (ref 0.0–0.1)
EOS%: 2.2 % (ref 0.0–7.0)
Eosinophils Absolute: 0.1 10*3/uL (ref 0.0–0.5)
HCT: 39.2 % (ref 38.4–49.9)
HGB: 13.1 g/dL (ref 13.0–17.1)
LYMPH%: 11.5 % — ABNORMAL LOW (ref 14.0–49.0)
MCH: 30.8 pg (ref 27.2–33.4)
MCHC: 33.4 g/dL (ref 32.0–36.0)
MCV: 92.2 fL (ref 79.3–98.0)
MONO#: 0.5 10*3/uL (ref 0.1–0.9)
MONO%: 12 % (ref 0.0–14.0)
NEUT#: 3.3 10*3/uL (ref 1.5–6.5)
NEUT%: 73.4 % (ref 39.0–75.0)
Platelets: 242 10*3/uL (ref 140–400)
RBC: 4.25 10*6/uL (ref 4.20–5.82)
RDW: 14.2 % (ref 11.0–14.6)
WBC: 4.5 10*3/uL (ref 4.0–10.3)
lymph#: 0.5 10*3/uL — ABNORMAL LOW (ref 0.9–3.3)

## 2014-01-01 LAB — COMPREHENSIVE METABOLIC PANEL (CC13)
ALT: 40 U/L (ref 0–55)
AST: 27 U/L (ref 5–34)
Albumin: 3.7 g/dL (ref 3.5–5.0)
Alkaline Phosphatase: 56 U/L (ref 40–150)
Anion Gap: 12 mEq/L — ABNORMAL HIGH (ref 3–11)
BUN: 12.4 mg/dL (ref 7.0–26.0)
CO2: 23 mEq/L (ref 22–29)
Calcium: 9.7 mg/dL (ref 8.4–10.4)
Chloride: 105 mEq/L (ref 98–109)
Creatinine: 0.9 mg/dL (ref 0.7–1.3)
Glucose: 135 mg/dl (ref 70–140)
Potassium: 4.1 mEq/L (ref 3.5–5.1)
Sodium: 141 mEq/L (ref 136–145)
Total Bilirubin: 0.29 mg/dL (ref 0.20–1.20)
Total Protein: 6.9 g/dL (ref 6.4–8.3)

## 2014-01-01 MED ORDER — IOHEXOL 300 MG/ML  SOLN
80.0000 mL | Freq: Once | INTRAMUSCULAR | Status: AC | PRN
  Administered 2014-01-01: 80 mL via INTRAVENOUS

## 2014-01-08 ENCOUNTER — Ambulatory Visit (HOSPITAL_COMMUNITY): Payer: Medicare Other

## 2014-01-08 ENCOUNTER — Ambulatory Visit: Payer: Medicare Other | Admitting: Internal Medicine

## 2014-01-13 ENCOUNTER — Encounter: Payer: Self-pay | Admitting: Radiation Oncology

## 2014-01-13 ENCOUNTER — Encounter: Payer: Self-pay | Admitting: Internal Medicine

## 2014-01-13 ENCOUNTER — Ambulatory Visit (HOSPITAL_BASED_OUTPATIENT_CLINIC_OR_DEPARTMENT_OTHER): Payer: Medicare Other | Admitting: Internal Medicine

## 2014-01-13 ENCOUNTER — Telehealth: Payer: Self-pay | Admitting: Internal Medicine

## 2014-01-13 VITALS — BP 129/84 | HR 89 | Temp 98.0°F | Resp 18 | Ht 65.0 in | Wt 148.0 lb

## 2014-01-13 DIAGNOSIS — C3490 Malignant neoplasm of unspecified part of unspecified bronchus or lung: Secondary | ICD-10-CM

## 2014-01-13 DIAGNOSIS — C3410 Malignant neoplasm of upper lobe, unspecified bronchus or lung: Secondary | ICD-10-CM

## 2014-01-13 NOTE — Progress Notes (Signed)
[facility]  [phone] Fax:([phone]) [phone] OFFICE PROGRESS NOTE  [name], MD [address] [place] [address]  DIAGNOSIS: Stage IIIA (T2a., N2, M0) non-small cell lung cancer adenocarcinoma with negative EGFR mutation and negative ALK gene translocation involving the left upper lobe middle mediastinal lymphadenopathy diagnosed in August of 2014  Lung cancer   Primary site: Lung (Left)   Staging method: AJCC 7th Edition   Clinical: Stage IIIA (T2a, N2, M0) signed by [name], MD on 06/19/2013  5:06 PM   Summary: Stage IIIA (T2a, N2, M0).  Molecular biomarkers: Positive for [id number]*7, [id number] ATM splice site [id number]>SJ TP53 E271K, SETD2 [id number] and SPOP E47K                                          Negative for: RET, ALK, BRAF, KRAS, ERBB2, MET and EGFR.  PRIOR THERAPY:  1) Status post left upper lobectomy with mediastinal lymph node dissection on 05/29/2013. 2) Adjuvant chemotherapy with cisplatin at 75 mg per meter squared and Alimta at 500 mg per meter squared given every 3 weeks for a total of 4 cycles. First cycle given on 07/10/2013 3) curative radiotherapy to the mediastinum under the care of Dr. [name] on 12/12/2013.  CURRENT THERAPY: Observation.  DISEASE STAGE: Lung cancer   Primary site: Lung (Left)   Staging method: AJCC 7th Edition   Clinical: Stage IIIA (T2a, N2, M0) signed by [name] on 06/19/2013  5:06 PM   Summary: Stage IIIA (T2a, N2, M0)  CHEMOTHERAPY INTENT: Control/curative  CURRENT # OF CHEMOTHERAPY CYCLES: 0  CURRENT ANTIEMETICS: Aloxi, dexamethasone, Emend, Compazine  CURRENT SMOKING STATUS: Former smoker  ORAL CHEMOTHERAPY AND CONSENT: n/a  CURRENT BISPHOSPHONATES USE: none  PAIN MANAGEMENT: 0/10  NARCOTICS INDUCED CONSTIPATION: None  LIVING WILL AND CODE STATUS: Full code   INTERVAL HISTORY: AnCHIPPER KOU[age]KA89.o. male returns for followup visit  accompanied by his wife. The patient is feeling fine today with no specific complaints except for hoarseness of his voice and occasional aspiration. He lost a few pounds recently after he started smoking again. He has been on observation for the last 3 months and doing very well. He denied having any significant nausea or vomiting, no fever or chills. The patient denied having any significant chest pain, shortness of breath, cough or hemoptysis. He still has some hoarseness of her was started after his surgical resection. He had repeat CT scan of the chest performed recently and he is here for evaluation and discussion of his scan results.  MEDICAL HISTORY: Past Medical History  Diagnosis Date  . Hyperlipidemia   . Diverticulosis   . Coronary artery disease     MI 1992, S/P  PTCA; negative stress test in November 2011 with no ischemia.   . Peripheral vascular disease 1/14    4.8x4.6 cm infrarenal abdominal aortic fusiform aneurysm, 1.5 cm right common iliac artery aneurysm  . AAA (abdominal aortic aneurysm)   . Hearing loss     Bilateral   . Myocardial infarction 1992    Dr [name]  . Lung cancer 10-14    non-small cell lung cancer  . History of radiation therapy 11/10/13-12/12/13    lung,50Gy/2560f  ALLERGIES:  has No Known Allergies.  MEDICATIONS:  Current Outpatient Prescriptions  Medication Sig Dispense Refill  . aspirin EC 81  MG tablet Take 81 mg by mouth daily.      Marland Kitchen emollient (BIAFINE) cream Apply 1 application topically 2 (two) times daily. Apply after radiation to chest area and bedtime daily      . Glucosamine-Chondroit-Vit C-Mn (GLUCOSAMINE 1500 COMPLEX PO) Take 1,500 mg by mouth daily.       . Multiple Vitamin (MULTIVITAMIN) tablet Take 1 tablet by mouth every evening.       Marland Kitchen omeprazole (PRILOSEC) 20 MG capsule Take 20 mg by mouth daily.      . rosuvastatin (CRESTOR) 40 MG tablet Take 1 tablet (40 mg total) by mouth daily.  30 tablet  5  . sucralfate (CARAFATE) 1 G  tablet Take 1 tablet (1 g total) by mouth 4 (four) times daily.  120 tablet  2   No current facility-administered medications for this visit.    SURGICAL HISTORY:  Past Surgical History  Procedure Laterality Date  . Ptca  1992  . Inguinal heriiorrhaphy bilaterally    . Colonoscopy w/ polypectomy  2003     negative 2010,due 2020; Dr Olevia Perches  . Pilonidal cyst excision    . Rotator cuff repair      Bilateral  . Cystoscopy/retrograde/ureteroscopy Bilateral 10/09/2012    Procedure: BILATERAL RETROGRADE bladder and urethral BIOPSY ;  Surgeon: Molli Hazard, MD;  Location: [facility];  Service: Urology;  Laterality: Bilateral;  BILATERAL RETROGRADE  AND bladder and urethral BIOPSY    . Prostate biopsy N/A 10/09/2012    Procedure: PROSTATIC URETHRAL BIOPSY;  Surgeon: Molli Hazard, MD;  Location: [facility];  Service: Urology;  Laterality: N/A;  PROSTATIC URETHRAL BIOPSY    . Hernia repair Bilateral     Inguinal  . Mediastinoscopy N/A 05/01/2013    Procedure: MEDIASTINOSCOPY;  Surgeon: Gaye Pollack, MD;  Location: [facility] OR;  Service: Thoracic;  Laterality: N/A;  . Video bronchoscopy N/A 05/01/2013    Procedure: VIDEO BRONCHOSCOPY;  Surgeon: Gaye Pollack, MD;  Location: [facility] OR;  Service: Thoracic;  Laterality: N/A;  . Coronary angioplasty      no stents  . Thorocotomy with lobectomy Left 05/29/2013    Procedure: LEFT THOROCOTOMY WITH LEFT UPPER LOBE LOBECTOMY;  Surgeon: Gaye Pollack, MD;  Location: [facility] OR;  Service: Thoracic;  Laterality: Left;    REVIEW OF SYSTEMS:  Constitutional: negative Eyes: negative Ears, nose, mouth, throat, and face: positive for hoarseness Respiratory: negative Cardiovascular: negative Gastrointestinal: negative Genitourinary:negative Integument/breast: negative Hematologic/lymphatic: negative Musculoskeletal:negative Neurological: negative Behavioral/Psych: negative Endocrine: negative Allergic/Immunologic: negative   PHYSICAL EXAMINATION:  General appearance: alert, cooperative, appears stated age and no distress Head: Normocephalic, without obvious abnormality, atraumatic Neck: no adenopathy, no carotid bruit, no JVD, supple, symmetrical, trachea midline and thyroid not enlarged, symmetric, no tenderness/mass/nodules Lymph nodes: Cervical, supraclavicular, and axillary nodes normal. Resp: clear to auscultation bilaterally Cardio: S1, S2 normal and Occasional missed/skipped beat(s) GI: soft, non-tender; bowel sounds normal; no masses,  no organomegaly Extremities: extremities normal, atraumatic, no cyanosis or edema Neurologic: Alert and oriented X 3, normal strength and tone. Normal symmetric reflexes. Normal coordination and gait  ECOG PERFORMANCE STATUS: 1 - Symptomatic but completely ambulatory  Blood pressure 129/84, pulse 89, temperature 98 F (36.7 C), temperature source Oral, resp. rate 18, height _0  (1.651 m), weight 148 lb (67.132 kg).  LABORATORY DATA: Lab Results  Component Value Date   WBC 4.5 01/01/2014   HGB 13.1 01/01/2014   HCT 39.2 01/01/2014   MCV 92.2 01/01/2014   PLT 242  01/01/2014      Chemistry      Component Value Date/Time   NA 141 01/01/2014 0907   NA 141 10/07/2013 1110   K 4.1 01/01/2014 0907   K 5.1 10/07/2013 1110   CL 101 10/07/2013 1110   CO2 23 01/01/2014 0907   CO2 27 10/07/2013 1110   BUN 12.4 01/01/2014 0907   BUN 11 10/07/2013 1110   CREATININE 0.9 01/01/2014 0907   CREATININE 0.86 10/07/2013 1110   CREATININE 0.76 04/11/2013 0928      Component Value Date/Time   CALCIUM 9.7 01/01/2014 0907   CALCIUM 10.2 10/07/2013 1110   ALKPHOS 56 01/01/2014 0907   ALKPHOS 67 10/07/2013 1110   AST 27 01/01/2014 0907   AST 30 10/07/2013 1110   ALT 40 01/01/2014 0907   ALT 26 10/07/2013 1110   BILITOT 0.29 01/01/2014 0907   BILITOT 0.3 10/07/2013 1110       RADIOGRAPHIC STUDIES: Ct Chest W Contrast  01/01/2014   CLINICAL DATA:  Restaging left lung cancer.  EXAM: CT CHEST WITH CONTRAST  TECHNIQUE:  Multidetector CT imaging of the chest was performed during intravenous contrast administration.  CONTRAST:  105m OMNIPAQUE IOHEXOL 300 MG/ML  SOLN  COMPARISON:  10/07/2013  FINDINGS: Postoperative findings from left upper lobectomy no recurrent malignancy identified. No pathologic thoracic adenopathy. No bony lesions suspicious for osseous metastatic disease are noted. Adrenal glands unremarkable. Hypodense left renal lesions favor cysts. Small hypodense lesion in the dome of the right hepatic lobe is stable from 08/30/2012 and was not hypermetabolic on PET-CT. This is also likely a small benign lesion such as cyst.  Prior left rotator cuff repair.  IMPRESSION: 1. No findings of recurrent malignancy.   Electronically Signed   By: WSherryl BartersM.D.   On: 01/01/2014 12:04   ASSESSMENT/PLAN: This is a very pleasant 73year old Caucasian male recently diagnosed with stage IIIA (T2a., N2, M0) non-small cell lung cancer, adenocarcinoma involving the left upper lobe and mediastinal lymphadenopathy diagnosed in August of 2014. He is status post left upper lobectomy with mediastinal lymph node dissection on 05/29/2013. He is status post 4 cycles of adjuvant chemotherapy with cisplatin 75 mg per meter squared and Alimta 500 mg per meter squared given every 3 weeks.  The recent CT scan of the chest showed no evidence for disease recurrence. I discussed the scan results with the patient and his wife. For the persistent hoarseness of his voice and occasional aspiration, I will refer the patient to ENT for evaluation. For smoke cessation, I strongly encouraged the patient to quit smoking and offered him to smoke cessation program but he would use a fentanyl patch again for him to quit smoking.  I will see the patient back for followup visit in 3 months with repeat CT scan of the chest for restaging of his disease. He was advised to call immediately if he has any concerning symptoms in the interval. All questions were  answered. The patient knows to call the clinic with any problems, questions or concerns. We can certainly see the patient much sooner if necessary.  Disclaimer: This note was dictated with voice recognition software. Similar sounding words can inadvertently be transcribed and may not be corrected upon review.

## 2014-01-13 NOTE — Telephone Encounter (Signed)
gv adn printed appt sched and avs for pt for June and Aug

## 2014-01-15 ENCOUNTER — Encounter: Payer: Self-pay | Admitting: Radiation Oncology

## 2014-01-15 ENCOUNTER — Ambulatory Visit
Admission: RE | Admit: 2014-01-15 | Discharge: 2014-01-15 | Disposition: A | Discharge status: Home / Self Care | Payer: Medicare Other | Source: Ambulatory Visit | Attending: Radiation Oncology | Admitting: Radiation Oncology

## 2014-01-15 VITALS — BP 143/83 | HR 98 | Temp 98.3°F | Resp 20 | Ht 65.0 in | Wt 149.5 lb

## 2014-01-15 DIAGNOSIS — C3410 Malignant neoplasm of upper lobe, unspecified bronchus or lung: Secondary | ICD-10-CM

## 2014-01-15 HISTORY — DX: Personal history of irradiation: Z92.3

## 2014-01-15 NOTE — Progress Notes (Signed)
Follow up s/p rad txs 11/10/13-12/12/13 central chest, well healed, no c/o pain, no  Nausea, no coughing, stil gets food stuck in his windpipe stated, not taking carafate, regular diet, no sob, 99% room air, saw Dr.Mohamed 01/13/14,sleeps well no fatigue 4:03 PM

## 2014-01-16 NOTE — Progress Notes (Signed)
Radiation Oncology         [phone] ________________________________  Name: Jordan Mccullough MRN: [id number]  Date: 01/15/2014  DOB: [birth year]  Follow-Up Visit Note  CC: [name], MD  Gaye Pollack, MD  Diagnosis:   Non-small cell lung cancer  Interval Since Last Radiation:  One month   Narrative:  The patient returns today for routine follow-up.  The patient states that he is doing well. He indicates that his skin is well healed. No complaints of pain. He does note some occasional difficulty with food getting stuck in his throat. He denies esophagitis more inferiorly and he states that he has not needed to take Carafate. No fatigue. The patient had a recent CT scan of the chest which looks good without any findings of recurrent malignancy.                              ALLERGIES:  has No Known Allergies.  Meds: Current Outpatient Prescriptions  Medication Sig Dispense Refill  . aspirin EC 81 MG tablet Take 81 mg by mouth daily.      Marland Kitchen emollient (BIAFINE) cream Apply 1 application topically 2 (two) times daily. Apply after radiation to chest area and bedtime daily      . Glucosamine-Chondroit-Vit C-Mn (GLUCOSAMINE 1500 COMPLEX PO) Take 1,500 mg by mouth daily.       . Multiple Vitamin (MULTIVITAMIN) tablet Take 1 tablet by mouth every evening.       Marland Kitchen omeprazole (PRILOSEC) 20 MG capsule Take 20 mg by mouth daily.      . rosuvastatin (CRESTOR) 40 MG tablet Take 1 tablet (40 mg total) by mouth daily.  30 tablet  5   No current facility-administered medications for this encounter.    Physical Findings: The patient is in no acute distress. Patient is alert and oriented.  height is 5\' 5"  (1.651 m) and weight is 149 lb 8 oz (67.813 kg). His oral temperature is 98.3 F (36.8 C). His blood pressure is 143/83 and his pulse is 98. His respiration is 20 and oxygen saturation is 99%. .   General: Well-developed, in no acute distress HEENT: Normocephalic, atraumatic Cardiovascular:  Regular rate and rhythm Respiratory: Clear to auscultation bilaterally GI: Soft, nontender, normal bowel sounds Extremities: No edema present   Lab Findings: Lab Results  Component Value Date   WBC 4.5 01/01/2014   HGB 13.1 01/01/2014   HCT 39.2 01/01/2014   MCV 92.2 01/01/2014   PLT 242 01/01/2014     Radiographic Findings: Ct Chest W Contrast  01/01/2014   CLINICAL DATA:  Restaging left lung cancer.  EXAM: CT CHEST WITH CONTRAST  TECHNIQUE: Multidetector CT imaging of the chest was performed during intravenous contrast administration.  CONTRAST:  56mL OMNIPAQUE IOHEXOL 300 MG/ML  SOLN  COMPARISON:  10/07/2013  FINDINGS: Postoperative findings from left upper lobectomy no recurrent malignancy identified. No pathologic thoracic adenopathy. No bony lesions suspicious for osseous metastatic disease are noted. Adrenal glands unremarkable. Hypodense left renal lesions favor cysts. Small hypodense lesion in the dome of the right hepatic lobe is stable from 08/30/2012 and was not hypermetabolic on PET-CT. This is also likely a small benign lesion such as cyst.  Prior left rotator cuff repair.  IMPRESSION: 1. No findings of recurrent malignancy.   Electronically Signed   By: Sherryl Barters M.D.   On: 01/01/2014 12:04    Impression:    The patient clinically  is doing well at this time. He has recovered well.   Plan:  He is continuing with followup with Dr. Earlie Server. I discussed potential followup options with him and he wish to followup in our clinic on a when necessary basis.    Jodelle Gross, M.D., Ph.D.

## 2014-04-09 ENCOUNTER — Other Ambulatory Visit: Payer: Self-pay | Admitting: Surgery

## 2014-04-09 LAB — BUN: BUN: 12 mg/dL (ref 6–23)

## 2014-04-09 LAB — CREATININE, SERUM: Creat: 0.78 mg/dL (ref 0.50–1.35)

## 2014-04-10 ENCOUNTER — Encounter: Payer: Self-pay | Admitting: Surgery

## 2014-04-13 ENCOUNTER — Ambulatory Visit (INDEPENDENT_AMBULATORY_CARE_PROVIDER_SITE_OTHER): Payer: Medicare Other | Admitting: Surgery

## 2014-04-13 ENCOUNTER — Encounter: Payer: Self-pay | Admitting: Surgery

## 2014-04-13 ENCOUNTER — Ambulatory Visit
Admission: RE | Admit: 2014-04-13 | Discharge: 2014-04-13 | Disposition: A | Discharge status: Home / Self Care | Payer: Medicare Other | Source: Ambulatory Visit | Attending: Surgery | Admitting: Surgery

## 2014-04-13 VITALS — BP 146/86 | HR 88 | Ht 65.0 in | Wt 143.0 lb

## 2014-04-13 DIAGNOSIS — I714 Abdominal aortic aneurysm, without rupture, unspecified: Secondary | ICD-10-CM

## 2014-04-13 MED ORDER — IOHEXOL 350 MG/ML SOLN
75.0000 mL | Freq: Once | INTRAVENOUS | Status: AC | PRN
  Administered 2014-04-13: 75 mL via INTRAVENOUS

## 2014-04-13 NOTE — Progress Notes (Signed)
Patient name: Jordan Mccullough MRN: [id number] DOB: 06-05-1941 Sex: male     Chief Complaint  Patient presents with  . Re-evaluation    6 month f/u CTA abd/pelvis prior    HISTORY OF PRESENT ILLNESS: The patient is back today for followup of his abdominal aortic aneurysm.  During his initial evaluation a lung lesion was found which turned out to be malignant.  He has undergone resection followed by chemotherapy and radiation.  His most recent CT scan showed no recurrence of disease.  He is here today for a CT scan of his aneurysm.  He denies any abdominal pain or back pain.  The patient is medically managed for hypercholesterolemia with a statin.  He suffers from coronary artery disease and has undergone balloon angioplasty in 1992 following a heart attack.  Past Medical History  Diagnosis Date  . Hyperlipidemia   . Diverticulosis   . Coronary artery disease     MI 1992, S/P  PTCA; negative stress test in November 2011 with no ischemia.   . Peripheral vascular disease 1/14    4.8x4.6 cm infrarenal abdominal aortic fusiform aneurysm, 1.5 cm right common iliac artery aneurysm  . AAA (abdominal aortic aneurysm)   . Hearing loss     Bilateral   . Myocardial infarction 1992    Dr Angelena Form    . History of radiation therapy 11/10/13-12/12/13    lung,50Gy/40fx  . Lung cancer 10-14    non-small cell lung cancer    Past Surgical History  Procedure Laterality Date  . Ptca  1992  . Inguinal heriiorrhaphy bilaterally    . Colonoscopy w/ polypectomy  2003     negative 2010,due 2020; Dr Olevia Perches  . Pilonidal cyst excision    . Rotator cuff repair      Bilateral  . Cystoscopy/retrograde/ureteroscopy Bilateral 10/09/2012    Procedure: BILATERAL RETROGRADE bladder and urethral BIOPSY ;  Surgeon: Molli Hazard, MD;  Location: [facility];  Service: Urology;  Laterality: Bilateral;  BILATERAL RETROGRADE  AND bladder and urethral BIOPSY    . Prostate biopsy N/A 10/09/2012    Procedure:  PROSTATIC URETHRAL BIOPSY;  Surgeon: Molli Hazard, MD;  Location: [facility];  Service: Urology;  Laterality: N/A;  PROSTATIC URETHRAL BIOPSY    . Hernia repair Bilateral     Inguinal  . Mediastinoscopy N/A 05/01/2013    Procedure: MEDIASTINOSCOPY;  Surgeon: Gaye Pollack, MD;  Location: [facility] OR;  Service: Thoracic;  Laterality: N/A;  . Video bronchoscopy N/A 05/01/2013    Procedure: VIDEO BRONCHOSCOPY;  Surgeon: Gaye Pollack, MD;  Location: [facility] OR;  Service: Thoracic;  Laterality: N/A;  . Coronary angioplasty      no stents  . Thorocotomy with lobectomy Left 05/29/2013    Procedure: LEFT THOROCOTOMY WITH LEFT UPPER LOBE LOBECTOMY;  Surgeon: Gaye Pollack, MD;  Location: [place];  Service: Thoracic;  Laterality: Left;    History   Social History  . Marital Status: Married    Spouse Name: N/A    Number of Children: N/A  . Years of Education: N/A   Occupational History  . Retired Risk analyst    Social History Main Topics  . Smoking status: Current Every Day Smoker -- 1.00 packs/day for 24 years    Types: Cigarettes, Cigars    Last Attempt to Quit: 05/28/2013  . Smokeless tobacco: Former Systems developer    Quit date: 05/21/2013     Comment: QUIT 15 YEARS AGO  . Alcohol  Use: 9.0 oz/week    3 Glasses of wine, 12 Cans of beer per week     Comment: daily  . Drug Use: No  . Sexual Activity: Not on file   Other Topics Concern  . Not on file   Social History Narrative   HEART HEALTHY DIET   RETIRED   MARRIED   FORMER SMOKER QUIT 1992   ALCOHOL USE -YES- RED WINE SOCIALLY   SMOKING -QUIT    Family History  Problem Relation Age of Onset  . Hypertension Mother   . Prostate cancer Father     Prostate cancer  . Cancer Father   . Lung cancer Sister     NON SMOKER  . Cancer Sister   . Hyperlipidemia Sister   . Diabetes Neg Hx   . Stroke Neg Hx   . Hyperlipidemia Brother   . Heart attack Brother   . Hypertension Brother     Allergies as of 04/13/2014  . (No Known  Allergies)    Current Outpatient Prescriptions on File Prior to Visit  Medication Sig Dispense Refill  . aspirin EC 81 MG tablet Take 81 mg by mouth daily.      Marland Kitchen emollient (BIAFINE) cream Apply 1 application topically 2 (two) times daily. Apply after radiation to chest area and bedtime daily      . Glucosamine-Chondroit-Vit C-Mn (GLUCOSAMINE 1500 COMPLEX PO) Take 1,500 mg by mouth daily.       . Multiple Vitamin (MULTIVITAMIN) tablet Take 1 tablet by mouth every evening.       Marland Kitchen omeprazole (PRILOSEC) 20 MG capsule Take 20 mg by mouth daily.      . rosuvastatin (CRESTOR) 40 MG tablet Take 1 tablet (40 mg total) by mouth daily.  30 tablet  5   No current facility-administered medications on file prior to visit.     REVIEW OF SYSTEMS: Cardiovascular: No chest pain, chest pressure, palpitations, orthopnea, or dyspnea on exertion. No claudication or rest pain,  No history of DVT or phlebitis. Pulmonary: No productive cough, asthma or wheezing. Neurologic: No weakness, paresthesias, aphasia, or amaurosis. No dizziness. Hematologic: No bleeding problems or clotting disorders. Musculoskeletal: No joint pain or joint swelling. Gastrointestinal: No blood in stool or hematemesis Genitourinary: No dysuria or hematuria. Psychiatric:: No history of major depression. Integumentary: No rashes or ulcers. Constitutional: No fever or chills.  PHYSICAL EXAMINATION:   Vital signs are BP 146/86  Pulse 88  Ht 5\' 5"  (1.651 m)  Wt 143 lb (64.864 kg)  BMI 23.80 kg/m2  SpO2 99% General: The patient appears their stated age. HEENT:  No gross abnormalities Pulmonary:  Non labored breathing Abdomen: Soft and non-tender Musculoskeletal: There are no major deformities. Neurologic: No focal weakness or paresthesias are detected, Skin: There are no ulcer or rashes noted. Psychiatric: The patient has normal affect. Cardiovascular: There is a regular rate and rhythm without significant murmur appreciated.   Palpable pedal pulses   Diagnostic Studies I have reviewed his CT angiogram.  This shows a 5.2 cm infrarenal abdominal aortic aneurysm.  This represents a 6 mm increase in size from one year ago  Assessment: Abdominal aortic aneurysm Plan: I discussed that the patient meets criteria for repair, given that it is greater than 5 cm, and that he has had a significant growth over the past one year.  I discussed the details of the operation as well as the risks and benefits which include but are not limited to the risk of bleeding, cardiopulmonary  complications, intestinal ischemia, lower extremity ischemia.  The patient understands these risks and wishes to proceed.  We have tentatively placed him on the schedule for Thursday, September 17.  He is scheduled to see Dr. Earlie Server in the immediate future with a followup of his lung cancer.  Eldridge Abrahams, M.D. [facility] Office: [phone] Pager:  [phone]

## 2014-04-14 ENCOUNTER — Encounter (HOSPITAL_COMMUNITY): Payer: Self-pay

## 2014-04-14 ENCOUNTER — Other Ambulatory Visit (HOSPITAL_BASED_OUTPATIENT_CLINIC_OR_DEPARTMENT_OTHER): Payer: Medicare Other

## 2014-04-14 ENCOUNTER — Ambulatory Visit (HOSPITAL_COMMUNITY)
Admission: RE | Admit: 2014-04-14 | Discharge: 2014-04-14 | Disposition: A | Discharge status: Home / Self Care | Payer: Medicare Other | Source: Ambulatory Visit | Attending: Internal Medicine | Admitting: Internal Medicine

## 2014-04-14 DIAGNOSIS — C3490 Malignant neoplasm of unspecified part of unspecified bronchus or lung: Secondary | ICD-10-CM

## 2014-04-14 DIAGNOSIS — C3410 Malignant neoplasm of upper lobe, unspecified bronchus or lung: Secondary | ICD-10-CM

## 2014-04-14 LAB — CBC WITH DIFFERENTIAL/PLATELET
BASO%: 0.4 % (ref 0.0–2.0)
Basophils Absolute: 0 10*3/uL (ref 0.0–0.1)
EOS%: 2 % (ref 0.0–7.0)
Eosinophils Absolute: 0.1 10*3/uL (ref 0.0–0.5)
HCT: 43.1 % (ref 38.4–49.9)
HGB: 14.8 g/dL (ref 13.0–17.1)
LYMPH%: 20.2 % (ref 14.0–49.0)
MCH: 30.3 pg (ref 27.2–33.4)
MCHC: 34.3 g/dL (ref 32.0–36.0)
MCV: 88.1 fL (ref 79.3–98.0)
MONO#: 0.5 10*3/uL (ref 0.1–0.9)
MONO%: 9.1 % (ref 0.0–14.0)
NEUT#: 3.5 10*3/uL (ref 1.5–6.5)
NEUT%: 68.3 % (ref 39.0–75.0)
Platelets: 229 10*3/uL (ref 140–400)
RBC: 4.89 10*6/uL (ref 4.20–5.82)
RDW: 14.8 % — ABNORMAL HIGH (ref 11.0–14.6)
WBC: 5.1 10*3/uL (ref 4.0–10.3)
lymph#: 1 10*3/uL (ref 0.9–3.3)

## 2014-04-14 LAB — COMPREHENSIVE METABOLIC PANEL (CC13)
ALT: 28 U/L (ref 0–55)
AST: 22 U/L (ref 5–34)
Albumin: 3.7 g/dL (ref 3.5–5.0)
Alkaline Phosphatase: 64 U/L (ref 40–150)
Anion Gap: 9 mEq/L (ref 3–11)
BUN: 13.5 mg/dL (ref 7.0–26.0)
CO2: 26 mEq/L (ref 22–29)
Calcium: 9.7 mg/dL (ref 8.4–10.4)
Chloride: 103 mEq/L (ref 98–109)
Creatinine: 0.8 mg/dL (ref 0.7–1.3)
Glucose: 162 mg/dl — ABNORMAL HIGH (ref 70–140)
Potassium: 4.5 mEq/L (ref 3.5–5.1)
Sodium: 138 mEq/L (ref 136–145)
Total Bilirubin: 0.48 mg/dL (ref 0.20–1.20)
Total Protein: 7.1 g/dL (ref 6.4–8.3)

## 2014-04-14 MED ORDER — IOHEXOL 300 MG/ML  SOLN
80.0000 mL | Freq: Once | INTRAMUSCULAR | Status: AC | PRN
  Administered 2014-04-14: 80 mL via INTRAVENOUS

## 2014-04-15 ENCOUNTER — Other Ambulatory Visit: Payer: Self-pay

## 2014-04-16 ENCOUNTER — Other Ambulatory Visit: Payer: Self-pay

## 2014-04-21 ENCOUNTER — Encounter: Payer: Self-pay | Admitting: Internal Medicine

## 2014-04-21 ENCOUNTER — Ambulatory Visit (HOSPITAL_BASED_OUTPATIENT_CLINIC_OR_DEPARTMENT_OTHER): Payer: Medicare Other | Admitting: Internal Medicine

## 2014-04-21 ENCOUNTER — Telehealth: Payer: Self-pay | Admitting: Internal Medicine

## 2014-04-21 VITALS — BP 132/84 | HR 90 | Temp 97.9°F | Resp 19 | Ht 65.0 in | Wt 141.7 lb

## 2014-04-21 DIAGNOSIS — R634 Abnormal weight loss: Secondary | ICD-10-CM

## 2014-04-21 DIAGNOSIS — C3412 Malignant neoplasm of upper lobe, left bronchus or lung: Secondary | ICD-10-CM

## 2014-04-21 DIAGNOSIS — C3410 Malignant neoplasm of upper lobe, unspecified bronchus or lung: Secondary | ICD-10-CM

## 2014-04-21 DIAGNOSIS — F17200 Nicotine dependence, unspecified, uncomplicated: Secondary | ICD-10-CM

## 2014-04-21 DIAGNOSIS — I714 Abdominal aortic aneurysm, without rupture, unspecified: Secondary | ICD-10-CM

## 2014-04-21 DIAGNOSIS — R5383 Other fatigue: Secondary | ICD-10-CM

## 2014-04-21 DIAGNOSIS — R5381 Other malaise: Secondary | ICD-10-CM

## 2014-04-21 NOTE — Progress Notes (Signed)
[facility]  Telephone:([phone]) [phone] [phone]) [phone] OFFICE PROGRESS NOTE  [name], MD [address] [address]  DIAGNOSIS: Stage IIIA (T2a., N2, M0) non-small cell lung cancer adenocarcinoma with negative EGFR mutation and negative ALK gene translocation involving the left upper lobe middle mediastinal lymphadenopathy diagnosed in August of 2014  Lung cancer   Primary site: Lung (Left)   Staging method: AJCC 7th Edition   Clinical: Stage IIIA (T2a, N2, M0) signed by [name], MD on 06/19/2013  5:06 PM   Summary: Stage IIIA (T2a, N2, M0).  Molecular biomarkers: Positive for [id number]*7, [id number] ATM splice site [id number]>PN TP53 E271K, SETD2 N131f17 and SPOP E47K                                          Negative for: RET, ALK, BRAF, KRAS, ERBB2, MET and EGFR.  PRIOR THERAPY:  1) Status post left upper lobectomy with mediastinal lymph node dissection on 05/29/2013. 2) Adjuvant chemotherapy with cisplatin at 75 mg per meter squared and Alimta at 500 mg per meter squared given every 3 weeks for a total of 4 cycles. First cycle given on 07/10/2013 3) curative radiotherapy to the mediastinum under the care of Dr. [name] on 12/12/2013.  CURRENT THERAPY: Observation.  DISEASE STAGE: Lung cancer   Primary site: Lung (Left)   Staging method: AJCC 7th Edition   Clinical: Stage IIIA (T2a, N2, M0) signed by [name] on 06/19/2013  5:06 PM   Summary: Stage IIIA (T2a, N2, M0)  CHEMOTHERAPY INTENT: Control/curative  CURRENT # OF CHEMOTHERAPY CYCLES: 0  CURRENT ANTIEMETICS: Aloxi, dexamethasone, Emend, Compazine  CURRENT SMOKING STATUS: Former smoker  ORAL CHEMOTHERAPY AND CONSENT: n/a  CURRENT BISPHOSPHONATES USE: none  PAIN MANAGEMENT: 0/10  NARCOTICS INDUCED CONSTIPATION: None  LIVING WILL AND CODE STATUS: Full code   INTERVAL HISTORY: Jordan [age]ERNO22.o. male returns for followup visit  accompanied by his wife. The patient is feeling fine today with no specific complaints except for hoarseness of his voice but this improved since his last visit. He lost a few pounds recently after he started smoking and he is trying to quit smoking but gradually. He denied having any significant nausea or vomiting, no fever or chills. The patient denied having any significant chest pain, shortness of breath, cough or hemoptysis. He was evaluated recently by Dr. BrTrula Sladenlarged abdominal aortic aneurysm and expected to have surgical repair for this on 05/07/2014. He had repeat CT scan of the chest performed recently and he is here for evaluation and discussion of his scan results.  MEDICAL HISTORY: Past Medical History  Diagnosis Date  . Hyperlipidemia   . Diverticulosis   . Coronary artery disease     MI 1992, S/P  PTCA; negative stress test in November 2011 with no ischemia.   . Peripheral vascular disease 1/14    4.8x4.6 cm infrarenal abdominal aortic fusiform aneurysm, 1.5 cm right common iliac artery aneurysm  . AAA (abdominal aortic aneurysm)   . Hearing loss     Bilateral   . Myocardial infarction 1992    Dr [name]  . History of radiation therapy 11/10/13-12/12/13    lung,50Gy/2557f. Lung cancer 10-14    non-small cell lung cancer    ALLERGIES:  has No Known Allergies.  MEDICATIONS:  Current Outpatient Prescriptions  Medication Sig Dispense  Refill  . aspirin EC 81 MG tablet Take 81 mg by mouth daily.      . Glucosamine-Chondroit-Vit C-Mn (GLUCOSAMINE 1500 COMPLEX PO) Take 1,500 mg by mouth daily.       . Multiple Vitamin (MULTIVITAMIN) tablet Take 1 tablet by mouth every evening.       . rosuvastatin (CRESTOR) 40 MG tablet Take 1 tablet (40 mg total) by mouth daily.  30 tablet  5  . emollient (BIAFINE) cream Apply 1 application topically 2 (two) times daily. Apply after radiation to chest area and bedtime daily      . omeprazole (PRILOSEC) 20 MG capsule Take 20 mg by mouth  daily.       No current facility-administered medications for this visit.    SURGICAL HISTORY:  Past Surgical History  Procedure Laterality Date  . Ptca  1992  . Inguinal heriiorrhaphy bilaterally    . Colonoscopy w/ polypectomy  2003     negative 2010,due 2020; Dr Olevia Perches  . Pilonidal cyst excision    . Rotator cuff repair      Bilateral  . Cystoscopy/retrograde/ureteroscopy Bilateral 10/09/2012    Procedure: BILATERAL RETROGRADE bladder and urethral BIOPSY ;  Surgeon: Molli Hazard, MD;  Location: [facility];  Service: Urology;  Laterality: Bilateral;  BILATERAL RETROGRADE  AND bladder and urethral BIOPSY    . Prostate biopsy N/A 10/09/2012    Procedure: PROSTATIC URETHRAL BIOPSY;  Surgeon: Molli Hazard, MD;  Location: [facility];  Service: Urology;  Laterality: N/A;  PROSTATIC URETHRAL BIOPSY    . Hernia repair Bilateral     Inguinal  . Mediastinoscopy N/A 05/01/2013    Procedure: MEDIASTINOSCOPY;  Surgeon: Gaye Pollack, MD;  [facility]: [facility] OR;  Service: Thoracic;  Laterality: N/A;  . Video bronchoscopy N/A 05/01/2013    Procedure: VIDEO BRONCHOSCOPY;  Surgeon: Gaye Pollack, MD;  Location: [facility] OR;  Service: Thoracic;  Laterality: N/A;  . Coronary angioplasty      no stents  . Thorocotomy with lobectomy Left 05/29/2013    Procedure: LEFT THOROCOTOMY WITH LEFT UPPER LOBE LOBECTOMY;  Surgeon: Gaye Pollack, MD;  Location: [facility] OR;  Service: Thoracic;  Laterality: Left;    REVIEW OF SYSTEMS:  A comprehensive review of systems was negative except for: Constitutional: positive for fatigue and weight loss   PHYSICAL EXAMINATION: General appearance: alert, cooperative, appears stated age and no distress Head: Normocephalic, without obvious abnormality, atraumatic Neck: no adenopathy, no carotid bruit, no JVD, supple, symmetrical, trachea midline and thyroid not enlarged, symmetric, no tenderness/mass/nodules Lymph nodes: Cervical, supraclavicular, and axillary nodes  normal. Resp: clear to auscultation bilaterally Cardio: S1, S2 normal and Occasional missed/skipped beat(s) GI: soft, non-tender; bowel sounds normal; no masses,  no organomegaly Extremities: extremities normal, atraumatic, no cyanosis or edema Neurologic: Alert and oriented X 3, normal strength and tone. Normal symmetric reflexes. Normal coordination and gait  ECOG PERFORMANCE STATUS: 1 - Symptomatic but completely ambulatory  Blood pressure 132/84, pulse 90, temperature 97.9 F (36.6 C), temperature source Oral, resp. rate 19, height 5' 5"  (1.651 m), weight 141 lb 11.2 oz (64.275 kg).  LABORATORY DATA: Lab Results  Component Value Date   WBC 5.1 04/14/2014   HGB 14.8 04/14/2014   HCT 43.1 04/14/2014   MCV 88.1 04/14/2014   PLT 229 04/14/2014      Chemistry      Component Value Date/Time   NA 138 04/14/2014 0805   NA 141 10/07/2013 1110   K 4.5 04/14/2014 0805  K 5.1 10/07/2013 1110   CL 101 10/07/2013 1110   CO2 26 04/14/2014 0805   CO2 27 10/07/2013 1110   BUN 13.5 04/14/2014 0805   BUN 12 04/09/2014 0953   CREATININE 0.8 04/14/2014 0805   CREATININE 0.78 04/09/2014 0953   CREATININE 0.86 10/07/2013 1110      Component Value Date/Time   CALCIUM 9.7 04/14/2014 0805   CALCIUM 10.2 10/07/2013 1110   ALKPHOS 64 04/14/2014 0805   ALKPHOS 67 10/07/2013 1110   AST 22 04/14/2014 0805   AST 30 10/07/2013 1110   ALT 28 04/14/2014 0805   ALT 26 10/07/2013 1110   BILITOT 0.48 04/14/2014 0805   BILITOT 0.3 10/07/2013 1110       RADIOGRAPHIC STUDIES: Ct Chest W Contrast  04/14/2014   CLINICAL DATA:  Lung cancer, diagnosis 2014. Completed chemotherapy and radiation therapy.  EXAM: CT CHEST WITH CONTRAST  TECHNIQUE: Multidetector CT imaging of the chest was performed during intravenous contrast administration.  CONTRAST:  37m OMNIPAQUE IOHEXOL 300 MG/ML  SOLN  COMPARISON:  01/01/2014  FINDINGS: Heart size is normal. Small amount of pericardial fluid is reidentified particularly in the superior  pericardial recess. Evidence of left upper lobectomy reidentified. No mass is identified at the resection margin. 5 mm pretracheal lymph node is stable. No pleural effusion. Linear left basilar scarring is stable. The lungs are otherwise clear. Linear left suprahilar scarring is presumably related to previous radiation fibrosis. Focal kyphosis at T11 is reidentified. No new osseous abnormality.  IMPRESSION: Stable changes of left upper lobectomy and radiation change without evidence for disease recurrence within the chest.   Electronically Signed   By: GConchita ParisM.D.   On: 04/14/2014 10:51   Ct Angio Abd/pel W/ And/or W/o  04/13/2014   CLINICAL DATA:  Abdominal aortic aneurysm  EXAM: CTA ABDOMEN AND PELVIS WITH CONTRAST  TECHNIQUE: Multidetector CT imaging of the abdomen and pelvis was performed using the standard protocol during bolus administration of intravenous contrast. Multiplanar reconstructed images and MIPs were obtained and reviewed to evaluate the vascular anatomy.  CONTRAST:  760mOMNIPAQUE IOHEXOL 350 MG/ML SOLN  COMPARISON:  04/14/2013  FINDINGS: The lung bases are free of acute infiltrate or sizable effusion.  The liver, spleen, adrenal glands and pancreas are within normal limits. The hypodensity seen on the prior exam within the right lobe of the liver near the dome of the diaphragm is less well visualized on the current study. Gallbladder is well distended and demonstrates calcified gallstones within. No wall thickening or pericholecystic fluid is seen. The kidneys are well visualized bilaterally and again demonstrate cystic change on the left. No renal calculi or obstructive changes are seen. The appendix is within normal limits. Diverticular change of the colon is again noted.  The bladder is well distended with non-opacified urine. The prostate is enlarged indenting upon the bladder inferiorly. These changes are stable from the prior exam. The fat containing inguinal hernias are again  noted bilaterally.  Vascular: There is again noted fusiform dilatation of the infrarenal aorta. A considerable amount of mural thrombus is again noted the aneurysm has increased in size now measuring 5.2 x 5.0 cm in greatest AP and transverse dimensions respectively. This is increased from 4.6 x 4.8 cm on the prior exam. Ectasia of the iliac arteries is noted bilaterally. No focal stenosis is seen. A small penetrating ulcer is again noted arising from the left common iliac artery. This is stable in appearance. Stable ectasia of the left in  the internal iliac artery is noted.  The visceral vessels are well visualize with the celiac axis, superior mesenteric artery and inferior mesenteric artery all being patent. A single left renal artery is again identified and widely patent. On the right, 3 renal arteries are identified. Two of these arteries supply the lower pole and arise just below the inferior aspect of the aneurysm. They supply approximately 25% of the right kidney as previously described.  Review of the MIP images confirms the above findings.  IMPRESSION: Stable cholelithiasis.  Fusiform abdominal aortic aneurysm as described which is increased in size by approximately 6 mm in the AP dimension when compared with the prior study.  Stable visceral vascular anatomy.  Atherosclerotic changes in the iliac arteries bilaterally. This is stable from the prior exam.  Other chronic changes as described above.   Electronically Signed   By: Inez Catalina M.D.   On: 04/13/2014 12:05   ASSESSMENT/PLAN: This is a very pleasant 73 year old Caucasian male recently diagnosed with stage IIIA (T2a., N2, M0) non-small cell lung cancer, adenocarcinoma involving the left upper lobe and mediastinal lymphadenopathy diagnosed in August of 2014. He is status post left upper lobectomy with mediastinal lymph node dissection on 05/29/2013. He is status post 4 cycles of adjuvant chemotherapy with cisplatin and Alimta. The patient has  been on observation for the last 6 months and he has no evidence for disease recurrence on the recent scan. I recommended for him to continue on observation with repeat CT scan of the chest in 3 months. From the oncology standpoint, the patient has no contraindication to proceed with the abdominal aortic aneurysm but he may need clearance from his primary care physician regarding any cardiac or respiratory issues. For smoke cessation, I strongly encouraged the patient to quit smoking and offered him to smoke cessation program  He was advised to call immediately if he has any concerning symptoms in the interval. All questions were answered. The patient knows to call the clinic with any problems, questions or concerns. We can certainly see the patient much sooner if necessary.  Disclaimer: This note was dictated with voice recognition software. Similar sounding words can inadvertently be transcribed and may not be corrected upon review.

## 2014-04-21 NOTE — Telephone Encounter (Signed)
gv and printed appt sched and avs for pt for Dec

## 2014-04-24 ENCOUNTER — Encounter: Payer: Self-pay | Admitting: Surgery

## 2014-05-04 NOTE — Pre-Procedure Instructions (Signed)
Jordan Mccullough  05/04/2014   Your procedure is scheduled on:  Thursday September 17 th at 0730 AM  Report to [facility] at 0530 AM.  Call this number if you have problems the morning of surgery: [phone]   Remember:   Do not eat food or drink liquids after midnight.   Take these medicines the morning of surgery with A SIP OF WATER: Aspirin, and Prilosec   Do not wear jewelry.  Do not wear lotions, powders, or colognes. You may not wear deodorant.           . Men may shave face and neck.  Do not bring valuables to the hospital.  [facility] is not responsible for any belongings or valuables.               Contacts, dentures or bridgework may not be worn into surgery.  Leave suitcase in the car. After surgery it may be brought to your room.  For patients admitted to the hospital, discharge time is determined by your  treatment team.               Patients discharged the day of surgery will not be allowed to drive home.    Special Instructions: [facility] - Preparing for Surgery  Before surgery, you can play an important role.  Because skin is not sterile, your skin needs to be as free of germs as possible.  You can reduce the number of germs on you skin by washing with CHG (chlorahexidine gluconate) soap before surgery.  CHG is an antiseptic cleaner which kills germs and bonds with the skin to continue killing germs even after washing.  Please DO NOT use if you have an allergy to CHG or antibacterial soaps.  If your skin becomes reddened/irritated stop using the CHG and inform your nurse when you arrive at Short Stay.  Do not shave (including legs and underarms) for at least 48 hours prior to the first CHG shower.  You may shave your face.  Please follow these instructions carefully:   1.  Shower with CHG Soap the night before surgery and the                                morning of Surgery.  2.  If you choose to wash your hair, wash your hair first as  usual with your       normal shampoo.  3.  After you shampoo, rinse your hair and body thoroughly to remove the                      Shampoo.  4.  Use CHG as you would any other liquid soap.  You can apply chg directly       to the skin and wash gently with scrungie or a clean washcloth.  5.  Apply the CHG Soap to your body ONLY FROM THE NECK DOWN.        Do not use on open wounds or open sores.  Avoid contact with your eyes,       ears, mouth and genitals (private parts).  Wash genitals (private parts)       with your normal soap.  6.  Wash thoroughly, paying special attention to the area where your surgery        will be performed.  7.  Thoroughly rinse your body with  warm water from the neck down.  8.  DO NOT shower/wash with your normal soap after using and rinsing off       the CHG Soap.  9.  Pat yourself dry with a clean towel.            10.  Wear clean pajamas.            11.  Place clean sheets on your bed the night of your first shower and do not        sleep with pets.  Day of Surgery  Do not apply any lotions/deoderants the morning of surgery.  Please wear clean clothes to the hospital/surgery center.      Please read over the following fact sheets that you were given: Pain Booklet, Coughing and Deep Breathing, Blood Transfusion Information, MRSA Information and Surgical Site Infection Prevention

## 2014-05-05 ENCOUNTER — Encounter (HOSPITAL_COMMUNITY)
Admission: RE | Admit: 2014-05-05 | Discharge: 2014-05-05 | Disposition: A | Discharge status: Home / Self Care | Payer: Medicare Other | Source: Ambulatory Visit | Attending: Anesthesiology | Admitting: Anesthesiology

## 2014-05-05 ENCOUNTER — Encounter (HOSPITAL_COMMUNITY)
Admission: RE | Admit: 2014-05-05 | Discharge: 2014-05-05 | Disposition: A | Discharge status: Home / Self Care | Payer: Medicare Other | Source: Ambulatory Visit | Attending: Surgery | Admitting: Surgery

## 2014-05-05 ENCOUNTER — Encounter (HOSPITAL_COMMUNITY): Payer: Self-pay

## 2014-05-05 HISTORY — DX: Essential (primary) hypertension: I10

## 2014-05-05 HISTORY — DX: Shortness of breath: R06.02

## 2014-05-05 LAB — CBC
HCT: 43.3 % (ref 39.0–52.0)
Hemoglobin: 14.9 g/dL (ref 13.0–17.0)
MCH: 29.6 pg (ref 26.0–34.0)
MCHC: 34.4 g/dL (ref 30.0–36.0)
MCV: 85.9 fL (ref 78.0–100.0)
Platelets: 218 10*3/uL (ref 150–400)
RBC: 5.04 MIL/uL (ref 4.22–5.81)
RDW: 15 % (ref 11.5–15.5)
WBC: 4.6 10*3/uL (ref 4.0–10.5)

## 2014-05-05 LAB — URINALYSIS, ROUTINE W REFLEX MICROSCOPIC
Bilirubin Urine: NEGATIVE
Glucose, UA: NEGATIVE mg/dL
Hgb urine dipstick: NEGATIVE
Ketones, ur: NEGATIVE mg/dL
Leukocytes, UA: NEGATIVE
Nitrite: NEGATIVE
Protein, ur: NEGATIVE mg/dL
Specific Gravity, Urine: 1.008 (ref 1.005–1.030)
Urobilinogen, UA: 0.2 mg/dL (ref 0.0–1.0)
pH: 7 (ref 5.0–8.0)

## 2014-05-05 LAB — COMPREHENSIVE METABOLIC PANEL
ALT: 35 U/L (ref 0–53)
AST: 27 U/L (ref 0–37)
Albumin: 3.4 g/dL — ABNORMAL LOW (ref 3.5–5.2)
Alkaline Phosphatase: 68 U/L (ref 39–117)
Anion gap: 13 (ref 5–15)
BUN: 13 mg/dL (ref 6–23)
CO2: 24 mEq/L (ref 19–32)
Calcium: 9.4 mg/dL (ref 8.4–10.5)
Chloride: 103 mEq/L (ref 96–112)
Creatinine, Ser: 0.69 mg/dL (ref 0.50–1.35)
GFR calc Af Amer: >90 mL/min (ref >90)
GFR calc non Af Amer: >90 mL/min (ref >90)
Glucose, Bld: 114 mg/dL — ABNORMAL HIGH (ref 70–99)
Potassium: 4.5 mEq/L (ref 3.7–5.3)
Sodium: 140 mEq/L (ref 137–147)
Total Bilirubin: 0.3 mg/dL (ref 0.3–1.2)
Total Protein: 7 g/dL (ref 6.0–8.3)

## 2014-05-05 LAB — SURGICAL PCR SCREEN
MRSA, PCR: NEGATIVE
Staphylococcus aureus: NEGATIVE

## 2014-05-05 LAB — APTT: aPTT: 25 seconds (ref 24–37)

## 2014-05-05 LAB — PROTIME-INR
INR: 1 (ref 0.00–1.49)
Prothrombin Time: 13.2 seconds (ref 11.6–15.2)

## 2014-05-05 LAB — TYPE AND SCREEN
ABO/RH(D): A POS
Antibody Screen: NEGATIVE

## 2014-05-05 NOTE — Progress Notes (Signed)
Lab attempted to get ABG's x 3 but was unable . Will draw stat DOS

## 2014-05-06 MED ORDER — CEFUROXIME SODIUM 1.5 G IJ SOLR
1.5000 g | INTRAMUSCULAR | Status: AC
  Administered 2014-05-07: 1.5 g via INTRAVENOUS
  Filled 2014-05-06: qty 1.5

## 2014-05-07 ENCOUNTER — Inpatient Hospital Stay (HOSPITAL_COMMUNITY): Payer: Medicare Other | Admitting: Certified Registered Nurse Anesthetist

## 2014-05-07 ENCOUNTER — Other Ambulatory Visit: Payer: Self-pay | Admitting: *Deleted

## 2014-05-07 ENCOUNTER — Encounter (HOSPITAL_COMMUNITY): Payer: Self-pay | Admitting: *Deleted

## 2014-05-07 ENCOUNTER — Encounter (HOSPITAL_COMMUNITY)
Admission: RE | Disposition: A | Discharge status: Home / Self Care | Payer: Self-pay | Source: Ambulatory Visit | Attending: Surgery

## 2014-05-07 ENCOUNTER — Inpatient Hospital Stay (HOSPITAL_COMMUNITY)
Admission: RE | Admit: 2014-05-07 | Discharge: 2014-05-08 | DRG: 238 | Disposition: A | Discharge status: Home / Self Care | Payer: Medicare Other | Source: Ambulatory Visit | Attending: Surgery | Admitting: Surgery

## 2014-05-07 ENCOUNTER — Inpatient Hospital Stay (HOSPITAL_COMMUNITY): Payer: Medicare Other

## 2014-05-07 ENCOUNTER — Encounter (HOSPITAL_COMMUNITY): Payer: Medicare Other | Admitting: Certified Registered Nurse Anesthetist

## 2014-05-07 DIAGNOSIS — E7800 Pure hypercholesterolemia, unspecified: Secondary | ICD-10-CM | POA: Diagnosis present

## 2014-05-07 DIAGNOSIS — I714 Abdominal aortic aneurysm, without rupture, unspecified: Secondary | ICD-10-CM

## 2014-05-07 DIAGNOSIS — Z9861 Coronary angioplasty status: Secondary | ICD-10-CM | POA: Diagnosis not present

## 2014-05-07 DIAGNOSIS — E785 Hyperlipidemia, unspecified: Secondary | ICD-10-CM | POA: Diagnosis present

## 2014-05-07 DIAGNOSIS — I252 Old myocardial infarction: Secondary | ICD-10-CM | POA: Diagnosis not present

## 2014-05-07 DIAGNOSIS — Z79899 Other long term (current) drug therapy: Secondary | ICD-10-CM

## 2014-05-07 DIAGNOSIS — Z48812 Encounter for surgical aftercare following surgery on the circulatory system: Secondary | ICD-10-CM

## 2014-05-07 DIAGNOSIS — I2510 Atherosclerotic heart disease of native coronary artery without angina pectoris: Secondary | ICD-10-CM | POA: Diagnosis present

## 2014-05-07 DIAGNOSIS — Z7982 Long term (current) use of aspirin: Secondary | ICD-10-CM | POA: Diagnosis not present

## 2014-05-07 DIAGNOSIS — Z85118 Personal history of other malignant neoplasm of bronchus and lung: Secondary | ICD-10-CM | POA: Diagnosis not present

## 2014-05-07 DIAGNOSIS — F17200 Nicotine dependence, unspecified, uncomplicated: Secondary | ICD-10-CM | POA: Diagnosis present

## 2014-05-07 DIAGNOSIS — Z9221 Personal history of antineoplastic chemotherapy: Secondary | ICD-10-CM | POA: Diagnosis not present

## 2014-05-07 HISTORY — PX: ABDOMINAL AORTIC ENDOVASCULAR STENT GRAFT: SHX5707

## 2014-05-07 LAB — BASIC METABOLIC PANEL
Anion gap: 12 (ref 5–15)
BUN: 12 mg/dL (ref 6–23)
CO2: 23 mEq/L (ref 19–32)
Calcium: 8.3 mg/dL — ABNORMAL LOW (ref 8.4–10.5)
Chloride: 107 mEq/L (ref 96–112)
Creatinine, Ser: 0.71 mg/dL (ref 0.50–1.35)
GFR calc Af Amer: >90 mL/min (ref >90)
GFR calc non Af Amer: >90 mL/min (ref >90)
Glucose, Bld: 107 mg/dL — ABNORMAL HIGH (ref 70–99)
Potassium: 4.1 mEq/L (ref 3.7–5.3)
Sodium: 142 mEq/L (ref 137–147)

## 2014-05-07 LAB — CBC
HCT: 37.5 % — ABNORMAL LOW (ref 39.0–52.0)
HCT: 38.5 % — ABNORMAL LOW (ref 39.0–52.0)
Hemoglobin: 13 g/dL (ref 13.0–17.0)
Hemoglobin: 13.4 g/dL (ref 13.0–17.0)
MCH: 30 pg (ref 26.0–34.0)
MCH: 30 pg (ref 26.0–34.0)
MCHC: 34.7 g/dL (ref 30.0–36.0)
MCHC: 34.8 g/dL (ref 30.0–36.0)
MCV: 86.4 fL (ref 78.0–100.0)
MCV: 86.3 fL (ref 78.0–100.0)
Platelets: 172 10*3/uL (ref 150–400)
Platelets: 177 10*3/uL (ref 150–400)
RBC: 4.34 MIL/uL (ref 4.22–5.81)
RBC: 4.46 MIL/uL (ref 4.22–5.81)
RDW: 15 % (ref 11.5–15.5)
RDW: 15.1 % (ref 11.5–15.5)
WBC: 6 10*3/uL (ref 4.0–10.5)
WBC: 9.3 10*3/uL (ref 4.0–10.5)

## 2014-05-07 LAB — PROTIME-INR
INR: 1.05 (ref 0.00–1.49)
Prothrombin Time: 13.7 seconds (ref 11.6–15.2)

## 2014-05-07 LAB — BLOOD GAS, ARTERIAL
Acid-base deficit: 1.3 mmol/L (ref 0.0–2.0)
Acid-base deficit: 0.1 mmol/L (ref 0.0–2.0)
Bicarbonate: 22.6 mEq/L (ref 20.0–24.0)
Bicarbonate: 24.6 mEq/L — ABNORMAL HIGH (ref 20.0–24.0)
Drawn by: 252031
FIO2: 0.21 %
O2 Content: 2 L/min
O2 Saturation: 95.4 %
O2 Saturation: 97.7 %
Patient temperature: 98.6
Patient temperature: 98.6
TCO2: 23.7 mmol/L (ref 0–100)
TCO2: 25.9 mmol/L (ref 0–100)
pCO2 arterial: 35.9 mmHg (ref 35.0–45.0)
pCO2 arterial: 43.8 mmHg (ref 35.0–45.0)
pH, Arterial: 7.416 (ref 7.350–7.450)
pH, Arterial: 7.367 (ref 7.350–7.450)
pO2, Arterial: 81.2 mmHg (ref 80.0–100.0)
pO2, Arterial: 103 mmHg — ABNORMAL HIGH (ref 80.0–100.0)

## 2014-05-07 LAB — CREATININE, SERUM
Creatinine, Ser: 0.7 mg/dL (ref 0.50–1.35)
GFR calc Af Amer: >90 mL/min (ref >90)
GFR calc non Af Amer: >90 mL/min (ref >90)

## 2014-05-07 LAB — APTT: aPTT: 26 seconds (ref 24–37)

## 2014-05-07 LAB — MAGNESIUM: Magnesium: 1.8 mg/dL (ref 1.5–2.5)

## 2014-05-07 SURGERY — INSERTION, ENDOVASCULAR STENT GRAFT, AORTA, ABDOMINAL
Anesthesia: General | Site: Abdomen

## 2014-05-07 MED ORDER — ALUM & MAG HYDROXIDE-SIMETH 200-200-20 MG/5ML PO SUSP
15.0000 mL | ORAL | Status: DC | PRN
  Administered 2014-05-07: 15 mL via ORAL
  Administered 2014-05-07: 30 mL via ORAL
  Filled 2014-05-07 (×2): qty 30

## 2014-05-07 MED ORDER — LIDOCAINE HCL (CARDIAC) 20 MG/ML IV SOLN
INTRAVENOUS | Status: AC
  Filled 2014-05-07: qty 5

## 2014-05-07 MED ORDER — PROMETHAZINE HCL 25 MG/ML IJ SOLN: 6.2500 mg | INTRAMUSCULAR | Status: DC | PRN

## 2014-05-07 MED ORDER — ACETAMINOPHEN 650 MG RE SUPP: 325.0000 mg | RECTAL | Status: DC | PRN

## 2014-05-07 MED ORDER — HYDRALAZINE HCL 20 MG/ML IJ SOLN: 10.0000 mg | INTRAMUSCULAR | Status: DC | PRN

## 2014-05-07 MED ORDER — LIDOCAINE HCL (CARDIAC) 20 MG/ML IV SOLN
INTRAVENOUS | Status: DC | PRN
  Administered 2014-05-07: 20 mg via INTRAVENOUS

## 2014-05-07 MED ORDER — CHLORHEXIDINE GLUCONATE CLOTH 2 % EX PADS: 6.0000 | MEDICATED_PAD | Freq: Once | CUTANEOUS | Status: DC

## 2014-05-07 MED ORDER — POTASSIUM CHLORIDE CRYS ER 20 MEQ PO TBCR: 20.0000 meq | EXTENDED_RELEASE_TABLET | Freq: Every day | ORAL | Status: DC | PRN

## 2014-05-07 MED ORDER — MIDAZOLAM HCL 2 MG/2ML IJ SOLN
INTRAMUSCULAR | Status: AC
  Filled 2014-05-07: qty 2

## 2014-05-07 MED ORDER — IODIXANOL 320 MG/ML IV SOLN
INTRAVENOUS | Status: DC | PRN
  Administered 2014-05-07: 70 mL via INTRAVENOUS

## 2014-05-07 MED ORDER — ASPIRIN EC 81 MG PO TBEC
81.0000 mg | DELAYED_RELEASE_TABLET | Freq: Every day | ORAL | Status: DC
  Administered 2014-05-08: 81 mg via ORAL
  Filled 2014-05-07 (×2): qty 1

## 2014-05-07 MED ORDER — ATORVASTATIN CALCIUM 80 MG PO TABS
80.0000 mg | ORAL_TABLET | Freq: Every day | ORAL | Status: DC
  Filled 2014-05-07 (×2): qty 1

## 2014-05-07 MED ORDER — SODIUM CHLORIDE 0.9 % IV SOLN
INTRAVENOUS | Status: DC
  Administered 2014-05-08: 05:00:00 via INTRAVENOUS

## 2014-05-07 MED ORDER — LABETALOL HCL 5 MG/ML IV SOLN: 10.0000 mg | INTRAVENOUS | Status: DC | PRN

## 2014-05-07 MED ORDER — MORPHINE SULFATE 2 MG/ML IJ SOLN
2.0000 mg | INTRAMUSCULAR | Status: DC | PRN
  Administered 2014-05-07 – 2014-05-08 (×3): 2 mg via INTRAVENOUS
  Filled 2014-05-07 (×3): qty 1

## 2014-05-07 MED ORDER — PROPOFOL 10 MG/ML IV BOLUS
INTRAVENOUS | Status: AC
  Filled 2014-05-07: qty 20

## 2014-05-07 MED ORDER — ONDANSETRON HCL 4 MG/2ML IJ SOLN
INTRAMUSCULAR | Status: AC
  Filled 2014-05-07: qty 2

## 2014-05-07 MED ORDER — OXYCODONE HCL 5 MG PO TABS: 5.0000 mg | ORAL_TABLET | Freq: Four times a day (QID) | ORAL | Status: DC | PRN

## 2014-05-07 MED ORDER — PHENYLEPHRINE 40 MCG/ML (10ML) SYRINGE FOR IV PUSH (FOR BLOOD PRESSURE SUPPORT)
PREFILLED_SYRINGE | INTRAVENOUS | Status: AC
  Filled 2014-05-07: qty 20

## 2014-05-07 MED ORDER — FENTANYL CITRATE 0.05 MG/ML IJ SOLN
INTRAMUSCULAR | Status: DC | PRN
  Administered 2014-05-07: 250 ug via INTRAVENOUS

## 2014-05-07 MED ORDER — NEOSTIGMINE METHYLSULFATE 10 MG/10ML IV SOLN
INTRAVENOUS | Status: DC | PRN
  Administered 2014-05-07: 4 mg via INTRAVENOUS

## 2014-05-07 MED ORDER — EPHEDRINE SULFATE 50 MG/ML IJ SOLN
INTRAMUSCULAR | Status: DC | PRN
  Administered 2014-05-07 (×2): 5 mg via INTRAVENOUS
  Administered 2014-05-07: 10 mg via INTRAVENOUS

## 2014-05-07 MED ORDER — ARTIFICIAL TEARS OP OINT
TOPICAL_OINTMENT | OPHTHALMIC | Status: AC
  Filled 2014-05-07: qty 3.5

## 2014-05-07 MED ORDER — LACTATED RINGERS IV SOLN
INTRAVENOUS | Status: DC | PRN
  Administered 2014-05-07 (×2): via INTRAVENOUS

## 2014-05-07 MED ORDER — CEFUROXIME SODIUM 1.5 G IJ SOLR
1.5000 g | Freq: Two times a day (BID) | INTRAMUSCULAR | Status: AC
  Administered 2014-05-07 – 2014-05-08 (×2): 1.5 g via INTRAVENOUS
  Filled 2014-05-07 (×2): qty 1.5

## 2014-05-07 MED ORDER — FENTANYL CITRATE 0.05 MG/ML IJ SOLN
INTRAMUSCULAR | Status: AC
  Filled 2014-05-07: qty 5

## 2014-05-07 MED ORDER — ARTIFICIAL TEARS OP OINT
TOPICAL_OINTMENT | OPHTHALMIC | Status: DC | PRN
  Administered 2014-05-07: 1 via OPHTHALMIC

## 2014-05-07 MED ORDER — OXYCODONE HCL 5 MG PO TABS: 5.0000 mg | ORAL_TABLET | Freq: Once | ORAL | Status: DC | PRN

## 2014-05-07 MED ORDER — PHENOL 1.4 % MT LIQD: 1.0000 | OROMUCOSAL | Status: DC | PRN

## 2014-05-07 MED ORDER — PROPOFOL 10 MG/ML IV BOLUS
INTRAVENOUS | Status: DC | PRN
  Administered 2014-05-07: 30 mg via INTRAVENOUS
  Administered 2014-05-07: 70 mg via INTRAVENOUS

## 2014-05-07 MED ORDER — GLYCOPYRROLATE 0.2 MG/ML IJ SOLN
INTRAMUSCULAR | Status: DC | PRN
  Administered 2014-05-07: 0.6 mg via INTRAVENOUS

## 2014-05-07 MED ORDER — OXYCODONE HCL 5 MG PO TABS: 5.0000 mg | ORAL_TABLET | ORAL | Status: DC | PRN

## 2014-05-07 MED ORDER — SODIUM CHLORIDE 0.9 % IV SOLN: 500.0000 mL | Freq: Once | INTRAVENOUS | Status: AC | PRN

## 2014-05-07 MED ORDER — LACTATED RINGERS IV SOLN
INTRAVENOUS | Status: DC | PRN
  Administered 2014-05-07: 07:00:00 via INTRAVENOUS

## 2014-05-07 MED ORDER — ENOXAPARIN SODIUM 30 MG/0.3ML ~~LOC~~ SOLN
30.0000 mg | SUBCUTANEOUS | Status: DC
  Administered 2014-05-08: 30 mg via SUBCUTANEOUS
  Filled 2014-05-07: qty 0.3

## 2014-05-07 MED ORDER — ACETAMINOPHEN 325 MG PO TABS: 325.0000 mg | ORAL_TABLET | ORAL | Status: DC | PRN

## 2014-05-07 MED ORDER — ROCURONIUM BROMIDE 100 MG/10ML IV SOLN
INTRAVENOUS | Status: DC | PRN
  Administered 2014-05-07: 40 mg via INTRAVENOUS

## 2014-05-07 MED ORDER — SODIUM CHLORIDE 0.9 % IR SOLN
Status: DC | PRN
  Administered 2014-05-07: 08:00:00

## 2014-05-07 MED ORDER — DOCUSATE SODIUM 100 MG PO CAPS
100.0000 mg | ORAL_CAPSULE | Freq: Every day | ORAL | Status: DC
  Administered 2014-05-08: 100 mg via ORAL
  Filled 2014-05-07: qty 1

## 2014-05-07 MED ORDER — MEPERIDINE HCL 25 MG/ML IJ SOLN: 6.2500 mg | INTRAMUSCULAR | Status: DC | PRN

## 2014-05-07 MED ORDER — ADULT MULTIVITAMIN W/MINERALS CH
1.0000 | ORAL_TABLET | Freq: Every evening | ORAL | Status: DC
  Filled 2014-05-07 (×2): qty 1

## 2014-05-07 MED ORDER — DOPAMINE-DEXTROSE 3.2-5 MG/ML-% IV SOLN: 3.0000 ug/kg/min | INTRAVENOUS | Status: DC

## 2014-05-07 MED ORDER — EPHEDRINE SULFATE 50 MG/ML IJ SOLN
INTRAMUSCULAR | Status: AC
  Filled 2014-05-07: qty 2

## 2014-05-07 MED ORDER — ONDANSETRON HCL 4 MG/2ML IJ SOLN
4.0000 mg | Freq: Four times a day (QID) | INTRAMUSCULAR | Status: DC | PRN
  Administered 2014-05-07 – 2014-05-08 (×2): 4 mg via INTRAVENOUS
  Filled 2014-05-07 (×2): qty 2

## 2014-05-07 MED ORDER — GUAIFENESIN-DM 100-10 MG/5ML PO SYRP: 15.0000 mL | ORAL_SOLUTION | ORAL | Status: DC | PRN

## 2014-05-07 MED ORDER — OXYCODONE HCL 5 MG/5ML PO SOLN: 5.0000 mg | Freq: Once | ORAL | Status: DC | PRN

## 2014-05-07 MED ORDER — 0.9 % SODIUM CHLORIDE (POUR BTL) OPTIME
TOPICAL | Status: DC | PRN
  Administered 2014-05-07: 1000 mL

## 2014-05-07 MED ORDER — ROCURONIUM BROMIDE 50 MG/5ML IV SOLN
INTRAVENOUS | Status: AC
  Filled 2014-05-07: qty 1

## 2014-05-07 MED ORDER — PHENYLEPHRINE HCL 10 MG/ML IJ SOLN
INTRAMUSCULAR | Status: DC | PRN
  Administered 2014-05-07: 40 ug via INTRAVENOUS
  Administered 2014-05-07 (×4): 80 ug via INTRAVENOUS
  Administered 2014-05-07: 40 ug via INTRAVENOUS
  Administered 2014-05-07: 80 ug via INTRAVENOUS

## 2014-05-07 MED ORDER — SODIUM CHLORIDE 0.9 % IV SOLN: INTRAVENOUS | Status: DC

## 2014-05-07 MED ORDER — METOPROLOL TARTRATE 1 MG/ML IV SOLN: 2.0000 mg | INTRAVENOUS | Status: DC | PRN

## 2014-05-07 MED ORDER — MIDAZOLAM HCL 5 MG/5ML IJ SOLN
INTRAMUSCULAR | Status: DC | PRN
  Administered 2014-05-07: 2 mg via INTRAVENOUS

## 2014-05-07 MED ORDER — PANTOPRAZOLE SODIUM 40 MG PO TBEC
40.0000 mg | DELAYED_RELEASE_TABLET | Freq: Every day | ORAL | Status: DC
  Administered 2014-05-08: 40 mg via ORAL
  Filled 2014-05-07: qty 1

## 2014-05-07 MED ORDER — PROTAMINE SULFATE 10 MG/ML IV SOLN
INTRAVENOUS | Status: DC | PRN
  Administered 2014-05-07: 20 mg via INTRAVENOUS
  Administered 2014-05-07: 30 mg via INTRAVENOUS

## 2014-05-07 MED ORDER — STERILE WATER FOR INJECTION IJ SOLN
INTRAMUSCULAR | Status: AC
  Filled 2014-05-07: qty 20

## 2014-05-07 MED ORDER — HEPARIN SODIUM (PORCINE) 1000 UNIT/ML IJ SOLN
INTRAMUSCULAR | Status: DC | PRN
  Administered 2014-05-07: 6000 [IU] via INTRAVENOUS

## 2014-05-07 MED ORDER — FENTANYL CITRATE 0.05 MG/ML IJ SOLN: 25.0000 ug | INTRAMUSCULAR | Status: DC | PRN

## 2014-05-07 MED ORDER — ONDANSETRON HCL 4 MG/2ML IJ SOLN
INTRAMUSCULAR | Status: DC | PRN
  Administered 2014-05-07: 4 mg via INTRAVENOUS

## 2014-05-07 SURGICAL SUPPLY — 79 items
BAG BANDED W/RUBBER/TAPE 36X54 (MISCELLANEOUS) ×2 IMPLANT
BAG SNAP BAND KOVER 36X36 (MISCELLANEOUS) ×8 IMPLANT
BLADE SURG CLIPPER 3M 9600 (MISCELLANEOUS) ×2 IMPLANT
CANISTER SUCTION 2500CC (MISCELLANEOUS) ×2 IMPLANT
CATH BEACON 5.038 65CM KMP-01 (CATHETERS) ×2 IMPLANT
CATH OMNI FLUSH .035X70CM (CATHETERS) ×2 IMPLANT
CLIP TI MEDIUM 24 (CLIP) IMPLANT
CLIP TI WIDE RED SMALL 24 (CLIP) IMPLANT
COVER DOME SNAP 22 D (MISCELLANEOUS) ×2 IMPLANT
COVER MAYO STAND STRL (DRAPES) ×2 IMPLANT
COVER PROBE W GEL 5X96 (DRAPES) ×2 IMPLANT
COVER SURGICAL LIGHT HANDLE (MISCELLANEOUS) ×2 IMPLANT
DERMABOND ADVANCED (GAUZE/BANDAGES/DRESSINGS) ×1
DERMABOND ADVANCED .7 DNX12 (GAUZE/BANDAGES/DRESSINGS) ×1 IMPLANT
DEVICE CLOSURE PERCLS PRGLD 6F (VASCULAR PRODUCTS) ×4 IMPLANT
DRAPE TABLE COVER HEAVY DUTY (DRAPES) ×2 IMPLANT
DRSG TEGADERM 2-3/8X2-3/4 SM (GAUZE/BANDAGES/DRESSINGS) ×4 IMPLANT
DRSG TEGADERM 4X4.75 (GAUZE/BANDAGES/DRESSINGS) ×2 IMPLANT
DRYSEAL FLEXSHEATH 12FR 33CM (SHEATH) ×1
DRYSEAL FLEXSHEATH 18FR 33CM (SHEATH) ×1
ELECT REM PT RETURN 9FT ADLT (ELECTROSURGICAL) ×4
ELECTRODE REM PT RTRN 9FT ADLT (ELECTROSURGICAL) ×2 IMPLANT
EXCLUDER TNK 28X14.5MMX12CM (Endovascular Graft) ×1 IMPLANT
EXCLUDER TRUNK 28X14.5MMX12CM (Endovascular Graft) ×2 IMPLANT
GAUZE SPONGE 2X2 8PLY STRL LF (GAUZE/BANDAGES/DRESSINGS) ×1 IMPLANT
GLOVE BIO SURGEON STRL SZ 6.5 (GLOVE) ×4 IMPLANT
GLOVE BIOGEL PI IND STRL 6.5 (GLOVE) ×1 IMPLANT
GLOVE BIOGEL PI IND STRL 7.5 (GLOVE) ×1 IMPLANT
GLOVE BIOGEL PI INDICATOR 6.5 (GLOVE) ×1
GLOVE BIOGEL PI INDICATOR 7.5 (GLOVE) ×1
GLOVE SURG SS PI 6.5 STRL IVOR (GLOVE) ×2 IMPLANT
GLOVE SURG SS PI 7.5 STRL IVOR (GLOVE) ×2 IMPLANT
GOWN STRL REUS W/ TWL LRG LVL3 (GOWN DISPOSABLE) ×2 IMPLANT
GOWN STRL REUS W/ TWL XL LVL3 (GOWN DISPOSABLE) ×1 IMPLANT
GOWN STRL REUS W/TWL LRG LVL3 (GOWN DISPOSABLE) ×2
GOWN STRL REUS W/TWL XL LVL3 (GOWN DISPOSABLE) ×1
GRAFT BALLN CATH 65CM (STENTS) ×1 IMPLANT
GRAFT EXCLUDER AORTIC 28X3.3CM (Endovascular Graft) ×2 IMPLANT
HEMOSTAT SNOW SURGICEL 2X4 (HEMOSTASIS) IMPLANT
KIT BASIN OR (CUSTOM PROCEDURE TRAY) ×2 IMPLANT
KIT ROOM TURNOVER OR (KITS) ×2 IMPLANT
LEG CONTRALATERAL 16X16X13.5 (Endovascular Graft) ×1 IMPLANT
LEG CONTRALATERAL 16X18X9.5 (Endovascular Graft) ×2 IMPLANT
NEEDLE PERC 18GX7CM (NEEDLE) ×2 IMPLANT
NS IRRIG 1000ML POUR BTL (IV SOLUTION) ×2 IMPLANT
PACK AORTA (CUSTOM PROCEDURE TRAY) ×2 IMPLANT
PAD ARMBOARD 7.5X6 YLW CONV (MISCELLANEOUS) ×4 IMPLANT
PENCIL BUTTON HOLSTER BLD 10FT (ELECTRODE) IMPLANT
PERCLOSE PROGLIDE 6F (VASCULAR PRODUCTS) ×8
PROTECTION STATION PRESSURIZED (MISCELLANEOUS) ×2
SHEATH AVANTI 11CM 8FR (MISCELLANEOUS) ×2 IMPLANT
SHEATH BRITE TIP 8FR 23CM (MISCELLANEOUS) ×2 IMPLANT
SHEATH DRYSEAL FLEX 12FR 33CM (SHEATH) ×1 IMPLANT
SHEATH DRYSEAL FLEX 18FR 33CM (SHEATH) ×1 IMPLANT
SHIELD RADPAD SCOOP 12X17 (MISCELLANEOUS) ×8 IMPLANT
SPONGE GAUZE 2X2 STER 10/PKG (GAUZE/BANDAGES/DRESSINGS) ×1
STATION PROTECTION PRESSURIZED (MISCELLANEOUS) ×1 IMPLANT
STENT GRAFT BALLN CATH 65CM (STENTS) ×1
STENT GRAFT CONTRALAT 16X13.5 (Endovascular Graft) ×1 IMPLANT
STOPCOCK MORSE 400PSI 3WAY (MISCELLANEOUS) ×2 IMPLANT
SUT ETHILON 3 0 PS 1 (SUTURE) IMPLANT
SUT PROLENE 5 0 C 1 24 (SUTURE) IMPLANT
SUT VIC AB 2-0 CT1 27 (SUTURE)
SUT VIC AB 2-0 CT1 TAPERPNT 27 (SUTURE) IMPLANT
SUT VIC AB 3-0 SH 27 (SUTURE)
SUT VIC AB 3-0 SH 27X BRD (SUTURE) IMPLANT
SUT VICRYL 4-0 PS2 18IN ABS (SUTURE) ×4 IMPLANT
SYR 20CC LL (SYRINGE) ×4 IMPLANT
SYR 30ML LL (SYRINGE) IMPLANT
SYR 5ML LL (SYRINGE) IMPLANT
SYR MEDRAD MARK V 150ML (SYRINGE) ×2 IMPLANT
SYRINGE 10CC LL (SYRINGE) ×6 IMPLANT
SYRINGE 20CC LL (MISCELLANEOUS) ×4 IMPLANT
TOWEL OR 17X24 6PK STRL BLUE (TOWEL DISPOSABLE) ×4 IMPLANT
TOWEL OR 17X26 10 PK STRL BLUE (TOWEL DISPOSABLE) ×4 IMPLANT
TRAY FOLEY CATH 16FRSI W/METER (SET/KITS/TRAYS/PACK) ×2 IMPLANT
TUBING HIGH PRESSURE 120CM (CONNECTOR) ×2 IMPLANT
WIRE AMPLATZ SS-J .035X180CM (WIRE) ×4 IMPLANT
WIRE BENTSON .035X145CM (WIRE) ×4 IMPLANT

## 2014-05-07 NOTE — Anesthesia Procedure Notes (Signed)
Procedure Name: Intubation Date/Time: 05/07/2014 7:48 AM Performed by: Ollen Bowl Pre-anesthesia Checklist: Patient identified, Emergency Drugs available, Suction available, Patient being monitored and Timeout performed Patient Re-evaluated:Patient Re-evaluated prior to inductionOxygen Delivery Method: Circle system utilized and Simple face mask Preoxygenation: Pre-oxygenation with 100% oxygen Intubation Type: IV induction Ventilation: Two handed mask ventilation required Laryngoscope Size: Miller and 3 Grade View: Grade I Tube type: Oral Tube size: 7.5 mm Number of attempts: 1 Airway Equipment and Method: Patient positioned with wedge pillow and Stylet Placement Confirmation: ETT inserted through vocal cords under direct vision,  positive ETCO2 and breath sounds checked- equal and bilateral Secured at: 22 cm Tube secured with: Tape Dental Injury: Teeth and Oropharynx as per pre-operative assessment

## 2014-05-07 NOTE — H&P (View-Only) (Signed)
Patient name: Jordan Mccullough MRN: [id number] DOB: 1941-05-11 Sex: male     Chief Complaint  Patient presents with  . Re-evaluation    6 month f/u CTA abd/pelvis prior    HISTORY OF PRESENT ILLNESS: The patient is back today for followup of his abdominal aortic aneurysm.  During his initial evaluation a lung lesion was found which turned out to be malignant.  He has undergone resection followed by chemotherapy and radiation.  His most recent CT scan showed no recurrence of disease.  He is here today for a CT scan of his aneurysm.  He denies any abdominal pain or back pain.  The patient is medically managed for hypercholesterolemia with a statin.  He suffers from coronary artery disease and has undergone balloon angioplasty in 1992 following a heart attack.  Past Medical History  Diagnosis Date  . Hyperlipidemia   . Diverticulosis   . Coronary artery disease     MI 1992, S/P  PTCA; negative stress test in November 2011 with no ischemia.   . Peripheral vascular disease 1/14    4.8x4.6 cm infrarenal abdominal aortic fusiform aneurysm, 1.5 cm right common iliac artery aneurysm  . AAA (abdominal aortic aneurysm)   . Hearing loss     Bilateral   . Myocardial infarction 1992    Dr Angelena Form    . History of radiation therapy 11/10/13-12/12/13    lung,50Gy/57fx  . Lung cancer 10-14    non-small cell lung cancer    Past Surgical History  Procedure Laterality Date  . Ptca  1992  . Inguinal heriiorrhaphy bilaterally    . Colonoscopy w/ polypectomy  2003     negative 2010,due 2020; Dr Olevia Perches  . Pilonidal cyst excision    . Rotator cuff repair      Bilateral  . Cystoscopy/retrograde/ureteroscopy Bilateral 10/09/2012    Procedure: BILATERAL RETROGRADE bladder and urethral BIOPSY ;  Surgeon: Molli Hazard, MD;  Location: [facility];  Service: Urology;  Laterality: Bilateral;  BILATERAL RETROGRADE  AND bladder and urethral BIOPSY    . Prostate biopsy N/A 10/09/2012    Procedure:  PROSTATIC URETHRAL BIOPSY;  Surgeon: Molli Hazard, MD;  Location: [facility];  Service: Urology;  Laterality: N/A;  PROSTATIC URETHRAL BIOPSY    . Hernia repair Bilateral     Inguinal  . Mediastinoscopy N/A 05/01/2013    Procedure: MEDIASTINOSCOPY;  Surgeon: Gaye Pollack, MD;  Location: [facility] OR;  Service: Thoracic;  Laterality: N/A;  . Video bronchoscopy N/A 05/01/2013    Procedure: VIDEO BRONCHOSCOPY;  Surgeon: Gaye Pollack, MD;  Location: [facility] OR;  Service: Thoracic;  Laterality: N/A;  . Coronary angioplasty      no stents  . Thorocotomy with lobectomy Left 05/29/2013    Procedure: LEFT THOROCOTOMY WITH LEFT UPPER LOBE LOBECTOMY;  Surgeon: Gaye Pollack, MD;  Location: [facility];  Service: Thoracic;  Laterality: Left;    History   Social History  . Marital Status: Married    Spouse Name: N/A    Number of Children: N/A  . Years of Education: N/A   Occupational History  . Retired Risk analyst    Social History Main Topics  . Smoking status: Current Every Day Smoker -- 1.00 packs/day for 24 years    Types: Cigarettes, Cigars    Last Attempt to Quit: 05/28/2013  . Smokeless tobacco: Former Systems developer    Quit date: 05/21/2013     Comment: QUIT 15 YEARS AGO  . Alcohol  Use: 9.0 oz/week    3 Glasses of wine, 12 Cans of beer per week     Comment: daily  . Drug Use: No  . Sexual Activity: Not on file   Other Topics Concern  . Not on file   Social History Narrative   HEART HEALTHY DIET   RETIRED   MARRIED   FORMER SMOKER QUIT 1992   ALCOHOL USE -YES- RED WINE SOCIALLY   SMOKING -QUIT    Family History  Problem Relation Age of Onset  . Hypertension Mother   . Prostate cancer Father     Prostate cancer  . Cancer Father   . Lung cancer Sister     NON SMOKER  . Cancer Sister   . Hyperlipidemia Sister   . Diabetes Neg Hx   . Stroke Neg Hx   . Hyperlipidemia Brother   . Heart attack Brother   . Hypertension Brother     Allergies as of 04/13/2014  . (No Known  Allergies)    Current Outpatient Prescriptions on File Prior to Visit  Medication Sig Dispense Refill  . aspirin EC 81 MG tablet Take 81 mg by mouth daily.      Marland Kitchen emollient (BIAFINE) cream Apply 1 application topically 2 (two) times daily. Apply after radiation to chest area and bedtime daily      . Glucosamine-Chondroit-Vit C-Mn (GLUCOSAMINE 1500 COMPLEX PO) Take 1,500 mg by mouth daily.       . Multiple Vitamin (MULTIVITAMIN) tablet Take 1 tablet by mouth every evening.       Marland Kitchen omeprazole (PRILOSEC) 20 MG capsule Take 20 mg by mouth daily.      . rosuvastatin (CRESTOR) 40 MG tablet Take 1 tablet (40 mg total) by mouth daily.  30 tablet  5   No current facility-administered medications on file prior to visit.     REVIEW OF SYSTEMS: Cardiovascular: No chest pain, chest pressure, palpitations, orthopnea, or dyspnea on exertion. No claudication or rest pain,  No history of DVT or phlebitis. Pulmonary: No productive cough, asthma or wheezing. Neurologic: No weakness, paresthesias, aphasia, or amaurosis. No dizziness. Hematologic: No bleeding problems or clotting disorders. Musculoskeletal: No joint pain or joint swelling. Gastrointestinal: No blood in stool or hematemesis Genitourinary: No dysuria or hematuria. Psychiatric:: No history of major depression. Integumentary: No rashes or ulcers. Constitutional: No fever or chills.  PHYSICAL EXAMINATION:   Vital signs are BP 146/86  Pulse 88  Ht 5\' 5"  (1.651 m)  Wt 143 lb (64.864 kg)  BMI 23.80 kg/m2  SpO2 99% General: The patient appears their stated age. HEENT:  No gross abnormalities Pulmonary:  Non labored breathing Abdomen: Soft and non-tender Musculoskeletal: There are no major deformities. Neurologic: No focal weakness or paresthesias are detected, Skin: There are no ulcer or rashes noted. Psychiatric: The patient has normal affect. Cardiovascular: There is a regular rate and rhythm without significant murmur appreciated.   Palpable pedal pulses   Diagnostic Studies I have reviewed his CT angiogram.  This shows a 5.2 cm infrarenal abdominal aortic aneurysm.  This represents a 6 mm increase in size from one year ago  Assessment: Abdominal aortic aneurysm Plan: I discussed that the patient meets criteria for repair, given that it is greater than 5 cm, and that he has had a significant growth over the past one year.  I discussed the details of the operation as well as the risks and benefits which include but are not limited to the risk of bleeding, cardiopulmonary  complications, intestinal ischemia, lower extremity ischemia.  The patient understands these risks and wishes to proceed.  We have tentatively placed him on the schedule for Thursday, September 17.  He is scheduled to see Dr. Earlie Server in the immediate future with a followup of his lung cancer.  Eldridge Abrahams, M.D. [facility] Office: [phone] Pager:  [phone]

## 2014-05-07 NOTE — Op Note (Addendum)
Patient name: Jordan Mccullough MRN: [id number] DOB: Dec 25, 1940 Sex: male  05/07/2014 Pre-operative Diagnosis: Abdominal aortic aneurysm Post-operative diagnosis:  Same Surgeon:  Eldridge Abrahams Assistants:  Leontine Locket Procedure:   #1: Endovascular repair of abdominal aortic aneurysm   #2: Proximal extension   #3: Bilateral ultrasound guided common femoral artery access   #4: Catheter in aorta x2   #5: Abdominal aortogram   #6  Distal extension Anesthesia:  General  Blood Loss:  See anesthesia record Specimens:  None  Findings:  There was a type IA endoleak which required a proximal cuff   devices used: Main body was primary right Gore 28 x 14 x 12.  Ipsilateral right extension was a Gore 16 x 18 x 9.5.  Contralateral left was a Gore 16 x 16 x 13.5.  Proximal extension was a Gore 28 x 3.0  Indications:  The patient most recently had a CT scan which showed rapid growth of his abdominal aortic aneurysm over the past year.  He now measures 5.2 cm.  He comes in today for repair.    Procedure:  The patient was identified in the holding area and taken to [facility]  The patient was then placed supine on the table. general anesthesia was administered.  The patient was prepped and draped in the usual sterile fashion.  A time out was called and antibiotics were administered.  Ultrasound was used to evaluate bilateral common femoral arteries.  Both had posterior plaque.  A #11 blade was used to make a skin nick.  Using an 18-gauge needle, bilateral common femoral arteries were cannulated under ultrasound guidance.  An 035 wire was advanced without resistance.  Pro-glide devices were deployed at the 11:00 and 1:00 position.  8 French sheaths were placed bilaterally.  The patient was fully heparinized.  A pigtail catheter was advanced up the left side.  A wire exchange was performed and over a stiff wire, an 18 French sheath was placed up the right side.  The main body device was prepared on the  back table.  This was a Gore 28 x 14 x 12 device.  It was advanced into the abdominal aorta.  Contrast injections were then performed, locating the renal arteries.  The device was then deployed down to the contralateral gate.  Multiple images were acquired.  I did reconstructing the graft a few times as I was trying to position the graft at the level of the renal arteries.  The initial deployments encroached too much on the renal arteries.  Ultimately I was satisfied with the position of the device.  Next, the contralateral gate was cannulated with a Kumpe catheter and a Benson wire.  An omni-flush catheter was able to be freely rotated within the device, confirming successful cannulation.  The image stretcher was rotated to a right anterior oblique position and a retrograde injection was performed through the sheath and the left groin, locating the left hypogastric artery.  An Amplatz superstiff wire was placed.  A 12 French sheath was then inserted followed by the contralateral left limb.  This was a Gore 16 x 16 x 13.5.  It was then deployed landing proximal to the hypogastric artery.  Next, the remaining portion of the ipsilateral limb was deployed.  A retrograde injection was performed with the image detected and a left anterior oblique position, confirming the location of the right hypogastric artery.  The right extension was then prepared and then inserted.  This  was a Gore 16 x 18 x 9.5.  It was deployed landing at the level of the right hypogastric artery.  Next, a Q-50 balloon was used to mold the proximal and distal attachment sites as well as device overlap.  A completion arteriogram was then performed.  This showed a type IA and a leak.  I felt that the device had been deployed slightly on the low side so as not to encroach on the renal arteries.  I therefore felt the best course of action was to place a proximal cuff.  A Gore 28 x 3.0 device was then advanced and then deployed landing at the level of  the renal arteries.  Balloon molding was then performed in a completion arteriogram was performed.  I no longer saw a type Ia endoleak.  There did appear to be a late type II endoleak.  At this point I did not feel any further intervention was needed.  Bentson wires were placed bilaterally.  The sheaths were then withdrawn and the pro-glide device was successfully deployed, closing both groins.  50 mg protamine was administered.  Once the protamine had circulated, both groins were hemostatic.  The incisions were closed with 4-0 Vicryl and Dermabond.  There were no immediate complications.   Disposition:  To PACU in stable condition.   Theotis Burrow, M.D. [facility] Office: [phone] Pager:  [phone]

## 2014-05-07 NOTE — Progress Notes (Signed)
Utilization review completed.  

## 2014-05-07 NOTE — Interval H&P Note (Signed)
History and Physical Interval Note:  05/07/2014 6:42 AM  Jordan Mccullough  has presented today for surgery, with the diagnosis of Abdominal aneurysm without mention of rupture   The various methods of treatment have been discussed with the patient and family. After consideration of risks, benefits and other options for treatment, the patient has consented to  Procedure(s): ABDOMINAL AORTIC ENDOVASCULAR STENT GRAFT (N/A) as a surgical intervention .  The patient's history has been reviewed, patient examined, no change in status, stable for surgery.  I have reviewed the patient's chart and labs.  Questions were answered to the patient's satisfaction.     BRABHAM IV, V. WELLS

## 2014-05-07 NOTE — OR Nursing (Signed)
Late entry for visipaque amount administered

## 2014-05-07 NOTE — Anesthesia Postprocedure Evaluation (Signed)
  Anesthesia Post-op Note  Patient: Jordan Mccullough  Procedure(s) Performed: Procedure(s): ABDOMINAL AORTIC ENDOVASCULAR STENT GRAFT (N/A)  Patient Location: PACU  Anesthesia Type:General  Level of Consciousness: awake, alert , oriented and patient cooperative  Airway and Oxygen Therapy: Patient Spontanous Breathing  Post-op Pain: none  Post-op Assessment: Post-op Vital signs reviewed, Patient's Cardiovascular Status Stable, Respiratory Function Stable, Patent Airway, No signs of Nausea or vomiting and Pain level controlled  Post-op Vital Signs: Reviewed and stable  Last Vitals:  Filed Vitals:   05/07/14 1230  BP: 129/72  Pulse: 81  Temp:   Resp: 18    Complications: No apparent anesthesia complications

## 2014-05-07 NOTE — Progress Notes (Signed)
S/P [facility] C/O abdominal "gas" pain No diarrhea abdomen soft and not distended  Will increase IVF Monitor closely for bowel ischemia.  No clear evidence at this time   Annamarie Major

## 2014-05-07 NOTE — Transfer of Care (Signed)
Immediate Anesthesia Transfer of Care Note  Patient: Jordan Mccullough  Procedure(s) Performed: Procedure(s): ABDOMINAL AORTIC ENDOVASCULAR STENT GRAFT (N/A)  Patient Location: PACU  Anesthesia Type:General  Level of Consciousness: awake and alert   Airway & Oxygen Therapy: Patient Spontanous Breathing and Patient connected to nasal cannula oxygen  Post-op Assessment: Report given to PACU RN, Post -op Vital signs reviewed and stable and Patient moving all extremities X 4  Post vital signs: Reviewed and stable  Complications: No apparent anesthesia complications

## 2014-05-07 NOTE — Anesthesia Preprocedure Evaluation (Addendum)
Anesthesia Evaluation  Patient identified by MRN, date of birth, ID band Patient awake    Reviewed: Allergy & Precautions, H&P , NPO status , Patient's Chart, lab work & pertinent test results  History of Anesthesia Complications Negative for: history of anesthetic complications  Airway Mallampati: I TM Distance: >3 FB Neck ROM: Full    Dental  (+) Teeth Intact, Dental Advisory Given, Caps   Pulmonary shortness of breath and with exertion, Current Smoker,  Lung cancer: lobectomy and XRT breath sounds clear to auscultation        Cardiovascular hypertension, + CAD, + Past MI and + Peripheral Vascular Disease PND: 4.8 x4.6 infrarenal aneurysm. Rhythm:Regular Rate:Normal  '13: EF 45%. basal to mid posterior and inferior moderate to severe hypokinesis, valves OK '12 stress test: no ischemia   Neuro/Psych negative neurological ROS     GI/Hepatic negative GI ROS, Neg liver ROS,   Endo/Other  negative endocrine ROS  Renal/GU negative Renal ROS     Musculoskeletal   Abdominal   Peds  Hematology negative hematology ROS (+)   Anesthesia Other Findings   Reproductive/Obstetrics                       Anesthesia Physical Anesthesia Plan  ASA: III  Anesthesia Plan: General   Post-op Pain Management:    Induction: Intravenous  Airway Management Planned: Oral ETT  Additional Equipment: Arterial line  Intra-op Plan:   Post-operative Plan: Extubation in OR  Informed Consent: I have reviewed the patients History and Physical, chart, labs and discussed the procedure including the risks, benefits and alternatives for the proposed anesthesia with the patient or authorized representative who has indicated his/her understanding and acceptance.   Dental advisory given  Plan Discussed with: CRNA, Anesthesiologist and Surgeon  Anesthesia Plan Comments: (Plan routine monitors, A line, GETA)        Anesthesia Quick Evaluation

## 2014-05-08 DIAGNOSIS — I714 Abdominal aortic aneurysm, without rupture, unspecified: Secondary | ICD-10-CM | POA: Diagnosis not present

## 2014-05-08 LAB — CBC
HCT: 38.7 % — ABNORMAL LOW (ref 39.0–52.0)
Hemoglobin: 13.5 g/dL (ref 13.0–17.0)
MCH: 29.9 pg (ref 26.0–34.0)
MCHC: 34.9 g/dL (ref 30.0–36.0)
MCV: 85.6 fL (ref 78.0–100.0)
Platelets: 208 10*3/uL (ref 150–400)
RBC: 4.52 MIL/uL (ref 4.22–5.81)
RDW: 15.1 % (ref 11.5–15.5)
WBC: 12 10*3/uL — ABNORMAL HIGH (ref 4.0–10.5)

## 2014-05-08 LAB — BASIC METABOLIC PANEL
Anion gap: 13 (ref 5–15)
BUN: 9 mg/dL (ref 6–23)
CO2: 24 mEq/L (ref 19–32)
Calcium: 8.6 mg/dL (ref 8.4–10.5)
Chloride: 100 mEq/L (ref 96–112)
Creatinine, Ser: 0.68 mg/dL (ref 0.50–1.35)
GFR calc Af Amer: >90 mL/min (ref >90)
GFR calc non Af Amer: >90 mL/min (ref >90)
Glucose, Bld: 107 mg/dL — ABNORMAL HIGH (ref 70–99)
Potassium: 4.1 mEq/L (ref 3.7–5.3)
Sodium: 137 mEq/L (ref 137–147)

## 2014-05-08 MED ORDER — BISACODYL 10 MG RE SUPP: 10.0000 mg | Freq: Every day | RECTAL | Status: DC | PRN

## 2014-05-08 MED ORDER — LORAZEPAM 0.5 MG PO TABS: 0.5000 mg | ORAL_TABLET | Freq: Four times a day (QID) | ORAL | Status: DC | PRN

## 2014-05-08 MED ORDER — INFLUENZA VAC SPLIT QUAD 0.5 ML IM SUSY
0.5000 mL | PREFILLED_SYRINGE | INTRAMUSCULAR | Status: DC
  Filled 2014-05-08: qty 0.5

## 2014-05-08 MED ORDER — OXYCODONE HCL 5 MG PO TABS: 5.0000 mg | ORAL_TABLET | Freq: Four times a day (QID) | ORAL | Status: DC | PRN

## 2014-05-08 MED ORDER — NICOTINE 21 MG/24HR TD PT24
21.0000 mg | MEDICATED_PATCH | TRANSDERMAL | Status: DC
  Administered 2014-05-08: 21 mg via TRANSDERMAL
  Filled 2014-05-08: qty 1

## 2014-05-08 MED ORDER — INFLUENZA VAC SPLIT QUAD 0.5 ML IM SUSY
0.5000 mL | PREFILLED_SYRINGE | Freq: Once | INTRAMUSCULAR | Status: AC
  Administered 2014-05-08: 0.5 mL via INTRAMUSCULAR
  Filled 2014-05-08: qty 0.5

## 2014-05-08 NOTE — Progress Notes (Signed)
Instructions again given to wife and patient no driving until after appt. with Dr. Wynonia Musty. Also discussed no smoking. Nodded head for understanding.  Discharged without complaint.  Refused suppository, states" I will take when I get home"

## 2014-05-08 NOTE — Progress Notes (Addendum)
Spoke with Monroe, Cochran in regards to patient feeling anxious, requested anti-anxiety medication and nicotine patch for patient.

## 2014-05-08 NOTE — Plan of Care (Signed)
Problem: Consults Goal: Diagnosis CEA/CES/AAA Stent Outcome: Completed/Met Date Met:  05/08/14 AAA Stent Goal: Tobacco Cessation referral if indicated Outcome: Progressing Nicotine patch applied  Problem: Phase I Progression Outcomes Goal: Other Phase I Outcomes/Goals Outcome: Progressing Monitoring for ischemic bowel

## 2014-05-08 NOTE — Progress Notes (Signed)
Progress Note    05/08/2014 7:33 AM 1 Day Post-Op  Subjective:  States he still has abdominal pain...states "it feels like gas, but it isn't gas b/c I am not burping or passing gas".  States he feels like he needs to have BM, but hasn't.  Vomited this am.   Afebrile HR 80's regular 702'O-378'H systolic 88% RA  Filed Vitals:   05/08/14 0400  BP: 147/78  Pulse: 87  Temp: 98.5 F (36.9 C)  Resp: 18    Physical Exam: Cardiac:  regular Lungs:  CTAB Incisions:  Bilateral groins are soft without hematoma Abdomen:  Soft, NT to palpation, decreased BS Extremities:  2+ palpable right PT; 2+ palpable left DP  CBC    Component Value Date/Time   WBC 12.0* 05/08/2014 0500   WBC 5.1 04/14/2014 0805   RBC 4.52 05/08/2014 0500   RBC 4.89 04/14/2014 0805   HGB 13.5 05/08/2014 0500   HGB 14.8 04/14/2014 0805   HCT 38.7* 05/08/2014 0500   HCT 43.1 04/14/2014 0805   PLT 208 05/08/2014 0500   PLT 229 04/14/2014 0805   MCV 85.6 05/08/2014 0500   MCV 88.1 04/14/2014 0805   MCH 29.9 05/08/2014 0500   MCH 30.3 04/14/2014 0805   MCHC 34.9 05/08/2014 0500   MCHC 34.3 04/14/2014 0805   RDW 15.1 05/08/2014 0500   RDW 14.8* 04/14/2014 0805   LYMPHSABS 1.0 04/14/2014 0805   LYMPHSABS 1.6 07/03/2013 1406   MONOABS 0.5 04/14/2014 0805   MONOABS 0.5 07/03/2013 1406   EOSABS 0.1 04/14/2014 0805   EOSABS 0.2 07/03/2013 1406   BASOSABS 0.0 04/14/2014 0805   BASOSABS 0.0 07/03/2013 1406    BMET    Component Value Date/Time   NA 137 05/08/2014 0500   NA 138 04/14/2014 0805   K 4.1 05/08/2014 0500   K 4.5 04/14/2014 0805   CL 100 05/08/2014 0500   CO2 24 05/08/2014 0500   CO2 26 04/14/2014 0805   GLUCOSE 107* 05/08/2014 0500   GLUCOSE 162* 04/14/2014 0805   BUN 9 05/08/2014 0500   BUN 13.5 04/14/2014 0805   CREATININE 0.68 05/08/2014 0500   CREATININE 0.8 04/14/2014 0805   CREATININE 0.78 04/09/2014 0953   CALCIUM 8.6 05/08/2014 0500   CALCIUM 9.7 04/14/2014 0805   GFRNONAA >90 05/08/2014 0500   GFRAA >90  05/08/2014 0500    INR    Component Value Date/Time   INR 1.05 05/07/2014 1350     Intake/Output Summary (Last 24 hours) at 05/08/14 0733 Last data filed at 05/08/14 0600  Gross per 24 hour  Intake 3587.5 ml  Output   2150 ml  Net 1437.5 ml     Assessment/Plan:  73 y.o. male is s/p:  #1: Endovascular repair of abdominal aortic aneurysm  #2: Proximal extension  #3: Bilateral ultrasound guided common femoral artery access  #4: Catheter in aorta x2  #5: Abdominal aortogram  1 Day Post-Op   -pt still c/o gas pain.  Pt abdomen is soft and non tender to palpation.  He is afebrile and WBC is slightly elevated, however, this could be due to his perioperative period. He has not had any diarrhea.  Will continue to monitor closely for bowel ischemia, continues to be no clear evidence of this at this time.  If he does not improve or symptoms worsen, may need CT scan. -continue IVF at rate of 100 -leave in stepdown today -DVT prophylaxis:  Lovenox to start this afternoon   Leontine Locket, PA-C  [facility] [phone] 05/08/2014 7:33 AM    I agree with the above.  I have seen and evaluated the patient.  He is postoperative day 1, status post endovascular aneurysm repair.  He had abdominal pain overnight.  He states that this has improved today.  Yesterday it was 10 out of 10.  Today is 4/10.  His abdomen remained soft.  He has not had any flatus or a bowel movement.  I told him that I feel it would be best to monitor him overnight until the pain resolves, or apical ears itself to be of GI origin.  I suspect that this is just pain related to device implantation and not bowel ischemia.  I will continue with IV hydration.  His white blood cell count showed a typical small increase today, as would be expected with endovascular aneurysm repair and thrombosis of the aneurysm sac.  Annamarie Major

## 2014-05-08 NOTE — Progress Notes (Signed)
Pt discharged to home, belongings returned to patient, VSS, pt verbalized understanding of discharge instructions, all questions answered

## 2014-05-11 ENCOUNTER — Encounter (HOSPITAL_COMMUNITY): Payer: Self-pay | Admitting: Surgery

## 2014-05-11 ENCOUNTER — Telehealth: Payer: Self-pay | Admitting: Surgery

## 2014-05-11 NOTE — Telephone Encounter (Addendum)
Message copied by Doristine Section on Mon May 11, 2014  1:04 PM ------      Message from: Peter Minium K      Created: Thu May 07, 2014 12:48 PM      Regarding: Schedule                   ----- Message -----         From: Gabriel Earing, PA-C         Sent: 05/07/2014  11:02 AM           To: [facility] Charge Pool            S/p [facility] 05/07/14.  F/u in 4 weeks with Dr. Trula Slade with CTA protocol.      thx ------  notified patient's wife of post op appt. on 06-08-14 at 10:15 for a CTA at [facility] and then to see dr. Elisha Ponder at 11:45

## 2014-05-11 NOTE — Discharge Summary (Signed)
[facility] Discharge Summary  Jordan Mccullough [birth year] 73 y.o. male  [id number]  Admission Date: 05/07/2014  Discharge Date: 05/08/2014  Physician: Harold Barban, MD  Admission Diagnosis: Abdominal aneurysm without mention of rupture 441.4   HPI:   This is a 73 y.o. male who has been followed by Dr. Trula Slade for an abdominal aortic aneurysm. He had a CT angiogram on 04/13/14  that showed a 5.2 cm infrarenal abdominal aortic aneurysm. This represents a 6 mm increase in size from one year ago. During his initial evaluation a lung lesion was found which turned out to be malignant. He has undergone resection followed by chemotherapy and radiation. He denies any abdominal pain or back pain. The patient met the criteria for repair, given that it is greater than 5 cm, and that he has had a significant growth over the past one year.  The patient is medically managed for hypercholesterolemia with a statin. He suffers from coronary artery disease and has undergone balloon angioplasty in 1992 following a heart attack.  Hospital Course:  The patient was admitted to the hospital and taken to the operating room on 05/07/2014 and underwent: Endovascular repair of abdominal aortic aneurysm  The patient tolerated the procedure well and was transported to the PACU in stable condition. He was taken to the stepdown unit. He had a small increase in his white blood cell count associated with surgery.   On POD 1, the patient stated that he had abdominal pain that started overnight. He stated that  "it feels like gas, but it isn't gas b/c I am not burping or passing gas". Stated he feels like he needs to have BM. He had some vomiting.  He had palpable pedal pulses bilaterally and his abdomen was soft and nondistended. He was continued with IV hydration. Around 1100, he said that he longer had abdominal pain and remained pain-free for the remainder of the day. He still felt like he needed to  have a bowel movement, but wanted to go home. It was discussed with Dr. Trula Slade and he was stable to go home. He was discharged home in good condition.    CBC    Component Value Date/Time   WBC 12.0* 05/08/2014 0500   WBC 5.1 04/14/2014 0805   RBC 4.52 05/08/2014 0500   RBC 4.89 04/14/2014 0805   HGB 13.5 05/08/2014 0500   HGB 14.8 04/14/2014 0805   HCT 38.7* 05/08/2014 0500   HCT 43.1 04/14/2014 0805   PLT 208 05/08/2014 0500   PLT 229 04/14/2014 0805   MCV 85.6 05/08/2014 0500   MCV 88.1 04/14/2014 0805   MCH 29.9 05/08/2014 0500   MCH 30.3 04/14/2014 0805   MCHC 34.9 05/08/2014 0500   MCHC 34.3 04/14/2014 0805   RDW 15.1 05/08/2014 0500   RDW 14.8* 04/14/2014 0805   LYMPHSABS 1.0 04/14/2014 0805   LYMPHSABS 1.6 07/03/2013 1406   MONOABS 0.5 04/14/2014 0805   MONOABS 0.5 07/03/2013 1406   EOSABS 0.1 04/14/2014 0805   EOSABS 0.2 07/03/2013 1406   BASOSABS 0.0 04/14/2014 0805   BASOSABS 0.0 07/03/2013 1406    BMET    Component Value Date/Time   NA 137 05/08/2014 0500   NA 138 04/14/2014 0805   K 4.1 05/08/2014 0500   K 4.5 04/14/2014 0805   CL 100 05/08/2014 0500   CO2 24 05/08/2014 0500   CO2 26 04/14/2014 0805   GLUCOSE 107* 05/08/2014 0500   GLUCOSE 162* 04/14/2014 0805  BUN 9 05/08/2014 0500   BUN 13.5 04/14/2014 0805   CREATININE 0.68 05/08/2014 0500   CREATININE 0.8 04/14/2014 0805   CREATININE 0.78 04/09/2014 0953   CALCIUM 8.6 05/08/2014 0500   CALCIUM 9.7 04/14/2014 0805   GFRNONAA >90 05/08/2014 0500   GFRAA >90 05/08/2014 0500     Discharge Instructions:   The patient is discharged to home with extensive instructions on wound care and progressive ambulation.  They are instructed not to drive or perform any heavy lifting until returning to see the physician in his office.  Discharge Instructions   ABDOMINAL PROCEDURE/ANEURYSM REPAIR/AORTO-BIFEMORAL BYPASS:  Call MD for increased abdominal pain; cramping diarrhea; nausea/vomiting    Complete by:  As directed      Call MD for:   redness, tenderness, or signs of infection (pain, swelling, bleeding, redness, odor or green/yellow discharge around incision site)    Complete by:  As directed      Call MD for:  severe or increased pain, loss or decreased feeling  in affected limb(s)    Complete by:  As directed      Call MD for:  temperature >100.5    Complete by:  As directed      Discharge wound care:    Complete by:  As directed   Shower daily with soap and water starting 05/09/14     Driving Restrictions    Complete by:  As directed   No driving for 2 weeks     Lifting restrictions    Complete by:  As directed   No lifting for 4 weeks     Resume previous diet    Complete by:  As directed            Discharge Diagnosis:  Abdominal aneurysm without mention of rupture 441.4  Secondary Diagnosis: Patient Active Problem List   Diagnosis Date Noted  . AAA (abdominal aortic aneurysm) 05/07/2014  . Lung cancer 06/19/2013  . Lung nodule 04/14/2013  . Abdominal aneurysm without mention of rupture 10/14/2012  . Aneurysm of common iliac artery 10/14/2012  . Elevated PSA 07/10/2012  . CAD (coronary artery disease) 07/31/2011  . DIVERTICULOSIS, COLON 07/05/2010  . COLONIC POLYPS 02/24/2010  . AMBLYOPIA, HX OF 02/24/2010  . HYPERGLYCEMIA, FASTING 05/15/2008  . HYPERLIPIDEMIA 05/23/2007  . MYOCARDIAL INFARCTION, HX OF 05/23/2007   Past Medical History  Diagnosis Date  . Hyperlipidemia   . Diverticulosis   . Coronary artery disease     MI 1992, S/P  PTCA; negative stress test in November 2011 with no ischemia.   . Peripheral vascular disease 1/14    4.8x4.6 cm infrarenal abdominal aortic fusiform aneurysm, 1.5 cm right common iliac artery aneurysm  . AAA (abdominal aortic aneurysm)   . Hearing loss     Bilateral   . Myocardial infarction 1992    Dr Angelena Form    . History of radiation therapy 11/10/13-12/12/13    lung,50Gy/62f  . Lung cancer 10-14    non-small cell lung cancer  . Hypertension   .  Shortness of breath        Medication List         aspirin EC 81 MG tablet  Take 81 mg by mouth daily.     GLUCOSAMINE 1500 COMPLEX PO  Take 1,500 mg by mouth daily.     multivitamin tablet  Take 1 tablet by mouth every evening.     oxyCODONE 5 MG immediate release tablet  Commonly known as:  ROXICODONE  Take 1 tablet (5 mg total) by mouth every 6 (six) hours as needed for severe pain.     rosuvastatin 40 MG tablet  Commonly known as:  CRESTOR  Take 1 tablet (40 mg total) by mouth daily.        Roxicodone #20 No Refill  Disposition: Home  Patient's condition: is Good  Follow up: 1. Dr. Trula Slade in 4 weeks with CTA   Virgina Jock, PA-C [facility] [phone] 05/11/2014  6:27 PM   - For VQI Registry use --- Instructions: Press F2 to tab through selections.  Delete question if not applicable.   Post-op:  Time to Extubation: [x]  In OR, [ ]  < 12 hrs, [ ]  12-24 hrs, [ ]  >=24 hrs Vasopressors Req. Post-op: No MI: No., [ ]  Troponin only, [ ]  EKG or Clinical New Arrhythmia: No CHF: No ICU Stay: 0 days Transfusion: No   Complications: Resp failure: No., [ ]  Pneumonia, [ ]  Ventilator Chg in renal function: No., [ ]  Inc. Cr > 0.5, [ ]  Temp. Dialysis, [ ]  Permanent dialysis Leg ischemia: No., no Surgery needed, [ ]  Yes, Surgery needed, [ ]  Amputation Bowel ischemia: No., [ ]  Medical Rx, [ ]  Surgical Rx Wound complication: No., [ ]  Superficial separation/infection, [ ]  Return to OR Return to OR: No  Return to OR for bleeding: No Stroke: No., [ ]  Minor, [ ]  Major  Discharge medications: Statin use:  Yes If No: [ ]  For Medical reasons, [ ]  Non-compliant, [ ]  Not-indicated ASA use:  Yes  If No: [ ]  For Medical reasons, [ ]  Non-compliant, [ ]  Not-indicated Plavix use:  No If No: [ ]  For Medical reasons, [ ]  Non-compliant, [ ]  Not-indicated Beta blocker use:  No If No: [ ]  For Medical reasons, [ ]  Non-compliant, [ ]  Not-indicated

## 2014-05-19 NOTE — Discharge Summary (Signed)
I agree with the above  Wells Brabham 

## 2014-06-01 ENCOUNTER — Other Ambulatory Visit: Payer: Self-pay

## 2014-06-01 DIAGNOSIS — E785 Hyperlipidemia, unspecified: Secondary | ICD-10-CM

## 2014-06-01 DIAGNOSIS — I252 Old myocardial infarction: Secondary | ICD-10-CM

## 2014-06-01 MED ORDER — ROSUVASTATIN CALCIUM 40 MG PO TABS: 40.0000 mg | ORAL_TABLET | Freq: Every day | ORAL | Status: DC

## 2014-06-05 ENCOUNTER — Encounter: Payer: Self-pay | Admitting: Surgery

## 2014-06-08 ENCOUNTER — Ambulatory Visit
Admission: RE | Admit: 2014-06-08 | Discharge: 2014-06-08 | Disposition: A | Discharge status: Home / Self Care | Payer: Medicare Other | Source: Ambulatory Visit | Attending: Surgery | Admitting: Surgery

## 2014-06-08 ENCOUNTER — Ambulatory Visit (INDEPENDENT_AMBULATORY_CARE_PROVIDER_SITE_OTHER): Payer: Self-pay | Admitting: Surgery

## 2014-06-08 ENCOUNTER — Encounter: Payer: Self-pay | Admitting: Surgery

## 2014-06-08 VITALS — BP 165/100 | HR 87 | Ht 65.0 in | Wt 144.0 lb

## 2014-06-08 DIAGNOSIS — I714 Abdominal aortic aneurysm, without rupture, unspecified: Secondary | ICD-10-CM | POA: Insufficient documentation

## 2014-06-08 DIAGNOSIS — Z48812 Encounter for surgical aftercare following surgery on the circulatory system: Secondary | ICD-10-CM

## 2014-06-08 MED ORDER — IOHEXOL 350 MG/ML SOLN
60.0000 mL | Freq: Once | INTRAVENOUS | Status: AC | PRN
  Administered 2014-06-08: 60 mL via INTRAVENOUS

## 2014-06-08 NOTE — Progress Notes (Signed)
POST OPERATIVE OFFICE NOTE    CC:  F/u for surgery  HPI:  This is a 73 y.o. male who is s/p EVAR last month.  He states that he has been doing quite well since surgery.  His only complaint is fatigue.  He states that he is staying active and went flying yesterday.    No Known Allergies  Current Outpatient Prescriptions  Medication Sig Dispense Refill  . aspirin EC 81 MG tablet Take 81 mg by mouth daily.      . Glucosamine-Chondroit-Vit C-Mn (GLUCOSAMINE 1500 COMPLEX PO) Take 1,500 mg by mouth daily.       . Multiple Vitamin (MULTIVITAMIN) tablet Take 1 tablet by mouth every evening.       Marland Kitchen oxyCODONE (ROXICODONE) 5 MG immediate release tablet Take 1 tablet (5 mg total) by mouth every 6 (six) hours as needed for severe pain.  20 tablet  0  . rosuvastatin (CRESTOR) 40 MG tablet Take 1 tablet (40 mg total) by mouth daily.  30 tablet  5   No current facility-administered medications for this visit.     ROS:  See HPI  Physical Exam:  Filed Vitals:   06/08/14 1141  BP: 165/100  Pulse: 87    Incision:  Bilateral groins are well healed with some dermabond still in place Extremities:  2+ palpable PT pulses bilaterally Abdomen: soft, NT/ND; no pulsatile mass is felt.  CTA 06/08/14: IMPRESSION:  1. Post EVAR CTA demonstrates slight decrease in aortic aneurysm sac  diameter from approximately 5.0 x 5.5 cm on the previous study to  5.0 x 5.1 cm on the current study. Serpiginous endoleak is visible  on the arterial phase of imaging communicating with the IMA origin  and the anterior aspect of the proximal endograft. This either  represents a type 2 endoleak or type 1A endoleak. Either the  inferior mesenteric artery or the proximal endograft appears to be  acting as outflow for the endoleak.  2. 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 There is some collateral  flow present reconstituting distal segments of the  accessory renal  arteries and resulting in very little persistent infarcted  parenchyma on delayed imaging.   A/P:  This is a 73 y.o. male here for f/u for  #1: Endovascular repair of abdominal aortic aneurysm  #2: Proximal extension  #3: Bilateral ultrasound guided common femoral artery access  #4: Catheter in aorta x2  #5: Abdominal aortogram  #6 Distal extension On 05/07/14  -pt doing quite well since surgery without abdominal pain or back pain -his fatigue should continue to improve and most likely the result of general anesthesia. -pt is somewhat hypertensive today--he did quit smoking a month ago and contributes some of his HTN to this.  I have advised him to take his BP bid and keep a record and f/u with his PCP for further recommendations.   -I removed the residual dermabond from his groin incisions bilaterally. -return in 6 months with CTA -the aneurysmal sac has decreased by 60mm, however, there is a Serpiginous endoleak is visible on the arterial phase of imaging communicating with the IMA origin and the anterior aspect of the proximal endograft. This either  represents a type 2 endoleak or type 1A endoleak. Either the  inferior mesenteric artery or the proximal endograft appears to be  acting as outflow for the endoleak.  -given the aneurysmal sac has decreased in size, we  will continue to observe this and repeat CTA in 6 months.     Leontine Locket, PA-C [facility] [phone]  Clinic MD:  Pt seen and examined with Dr. Trula Slade  I agree with the above.  I have seen and examined the patient.  He is status post endovascular aneurysm repair performed on 05/07/2014.  This is his first postoperative visit.  His postoperative course has been uncomplicated.  He has quit smoking.  His blood pressure has been elevated secondary to this.  I have reviewed his CT scan there is the concern of a type II vs. a type I endoleak.  Fortunately, the aneurysm has decreased  from 5.1 x 5.4 down to 5.1 x 5.0 cm.  I discussed the findings of a endoleak with the patient.  Because the aneurysm has decreased in size no further intervention is warranted at this time.  He will follow up in 6 months with a repeat CT scan.  Annamarie Major

## 2014-06-08 NOTE — Addendum Note (Signed)
Addended by: Mena Goes on: 06/08/2014 01:27 PM   Modules accepted: Orders

## 2014-06-23 ENCOUNTER — Encounter: Payer: Self-pay | Admitting: Internal Medicine

## 2014-06-23 ENCOUNTER — Ambulatory Visit (INDEPENDENT_AMBULATORY_CARE_PROVIDER_SITE_OTHER): Payer: Medicare Other | Admitting: Internal Medicine

## 2014-06-23 VITALS — BP 170/100 | HR 78 | Temp 98.1°F | Resp 13 | Wt 149.0 lb

## 2014-06-23 DIAGNOSIS — Z9889 Other specified postprocedural states: Secondary | ICD-10-CM

## 2014-06-23 DIAGNOSIS — I10 Essential (primary) hypertension: Secondary | ICD-10-CM | POA: Insufficient documentation

## 2014-06-23 MED ORDER — METOPROLOL TARTRATE 25 MG PO TABS: 25.0000 mg | ORAL_TABLET | Freq: Two times a day (BID) | ORAL | Status: DC

## 2014-06-23 NOTE — Progress Notes (Signed)
   Subjective:    Patient ID: Jordan Mccullough, male    DOB: September 20, 1940, 73 y.o.   MRN: [id number]  HPI   He underwent an endovascular aortic aneurysm repair 05/07/14. Since that time he has noted progressive elevation in his blood pressure despite the fact he's quit smoking.  He finds his averages to be 150/100. He has not been on antihypertensives in the past despite the history of aortic aneurysm  He has no cardio vascular symptoms  He exercises once a week on treadmill for 45 minutes  He ingests salt but he does avoid fried foods.    Review of Systems   Chest pain, palpitations, tachycardia, exertional dyspnea, paroxysmal nocturnal dyspnea, claudication or edema are absent.       Objective:   Physical Exam  Appears thin but healthy and well-nourished & in no acute distress  No carotid bruits are present.No neck vein distention present at 10 - 15 degrees. Thyroid normal to palpation  Heart  rate normal  with no gallop or murmur. Occasional  Premature beat  Chest is clear with no increased work of breathing. Breath sounds decreased  There is no evidence of aortic aneurysm or renal artery bruits  Abdomen soft with no organomegaly or masses. No HJR. No AAA palpable  No clubbing, cyanosis or edema present.  Pedal pulses are intact   No ischemic skin changes are present . Fingernails healthy   Alert and oriented. Strength, tone, DTRs reflexes normal        Assessment & Plan:  See Current Assessment & Plan in Problem List under specific Diagnosis

## 2014-06-23 NOTE — Patient Instructions (Addendum)

## 2014-06-23 NOTE — Assessment & Plan Note (Signed)
Lopressor 25 mg bid

## 2014-06-23 NOTE — Progress Notes (Signed)
Pre visit review using our clinic review tool, if applicable. No additional management support is needed unless otherwise documented below in the visit note. 

## 2014-07-03 ENCOUNTER — Encounter: Payer: Self-pay | Admitting: Internal Medicine

## 2014-07-10 ENCOUNTER — Encounter: Payer: Self-pay | Admitting: Internal Medicine

## 2014-07-11 ENCOUNTER — Encounter: Payer: Self-pay | Admitting: Internal Medicine

## 2014-07-11 ENCOUNTER — Other Ambulatory Visit: Payer: Self-pay | Admitting: Internal Medicine

## 2014-07-11 DIAGNOSIS — I10 Essential (primary) hypertension: Secondary | ICD-10-CM

## 2014-07-11 MED ORDER — METOPROLOL TARTRATE 50 MG PO TABS: 50.0000 mg | ORAL_TABLET | Freq: Two times a day (BID) | ORAL | Status: DC

## 2014-07-21 ENCOUNTER — Other Ambulatory Visit (HOSPITAL_BASED_OUTPATIENT_CLINIC_OR_DEPARTMENT_OTHER): Payer: Medicare Other

## 2014-07-21 ENCOUNTER — Other Ambulatory Visit: Payer: Medicare Other

## 2014-07-21 ENCOUNTER — Ambulatory Visit (HOSPITAL_COMMUNITY)
Admission: RE | Admit: 2014-07-21 | Discharge: 2014-07-21 | Disposition: A | Discharge status: Home / Self Care | Payer: Medicare Other | Source: Ambulatory Visit | Attending: Internal Medicine | Admitting: Internal Medicine

## 2014-07-21 ENCOUNTER — Other Ambulatory Visit: Payer: Self-pay | Admitting: Internal Medicine

## 2014-07-21 ENCOUNTER — Encounter (HOSPITAL_COMMUNITY): Payer: Self-pay

## 2014-07-21 DIAGNOSIS — Z902 Acquired absence of lung [part of]: Secondary | ICD-10-CM | POA: Diagnosis not present

## 2014-07-21 DIAGNOSIS — C3490 Malignant neoplasm of unspecified part of unspecified bronchus or lung: Secondary | ICD-10-CM | POA: Insufficient documentation

## 2014-07-21 DIAGNOSIS — I700 Atherosclerosis of aorta: Secondary | ICD-10-CM | POA: Diagnosis not present

## 2014-07-21 DIAGNOSIS — C3412 Malignant neoplasm of upper lobe, left bronchus or lung: Secondary | ICD-10-CM

## 2014-07-21 DIAGNOSIS — Z923 Personal history of irradiation: Secondary | ICD-10-CM | POA: Diagnosis not present

## 2014-07-21 DIAGNOSIS — I2510 Atherosclerotic heart disease of native coronary artery without angina pectoris: Secondary | ICD-10-CM | POA: Insufficient documentation

## 2014-07-21 DIAGNOSIS — Z9221 Personal history of antineoplastic chemotherapy: Secondary | ICD-10-CM | POA: Diagnosis not present

## 2014-07-21 DIAGNOSIS — J90 Pleural effusion, not elsewhere classified: Secondary | ICD-10-CM | POA: Insufficient documentation

## 2014-07-21 LAB — COMPREHENSIVE METABOLIC PANEL (CC13)
ALT: 34 U/L (ref 0–55)
AST: 23 U/L (ref 5–34)
Albumin: 3.7 g/dL (ref 3.5–5.0)
Alkaline Phosphatase: 71 U/L (ref 40–150)
Anion Gap: 7 mEq/L (ref 3–11)
BUN: 12.7 mg/dL (ref 7.0–26.0)
CO2: 29 mEq/L (ref 22–29)
Calcium: 9.7 mg/dL (ref 8.4–10.4)
Chloride: 102 mEq/L (ref 98–109)
Creatinine: 0.9 mg/dL (ref 0.7–1.3)
Glucose: 109 mg/dl (ref 70–140)
Potassium: 4.8 mEq/L (ref 3.5–5.1)
Sodium: 138 mEq/L (ref 136–145)
Total Bilirubin: 0.44 mg/dL (ref 0.20–1.20)
Total Protein: 7 g/dL (ref 6.4–8.3)

## 2014-07-21 LAB — CBC WITH DIFFERENTIAL/PLATELET
BASO%: 0.4 % (ref 0.0–2.0)
Basophils Absolute: 0 10*3/uL (ref 0.0–0.1)
EOS%: 2.7 % (ref 0.0–7.0)
Eosinophils Absolute: 0.1 10*3/uL (ref 0.0–0.5)
HCT: 41 % (ref 38.4–49.9)
HGB: 13.7 g/dL (ref 13.0–17.1)
LYMPH%: 18 % (ref 14.0–49.0)
MCH: 29.6 pg (ref 27.2–33.4)
MCHC: 33.4 g/dL (ref 32.0–36.0)
MCV: 88.6 fL (ref 79.3–98.0)
MONO#: 0.7 10*3/uL (ref 0.1–0.9)
MONO%: 12.5 % (ref 0.0–14.0)
NEUT#: 3.5 10*3/uL (ref 1.5–6.5)
NEUT%: 66.4 % (ref 39.0–75.0)
Platelets: 209 10*3/uL (ref 140–400)
RBC: 4.63 10*6/uL (ref 4.20–5.82)
RDW: 15.1 % — ABNORMAL HIGH (ref 11.0–14.6)
WBC: 5.3 10*3/uL (ref 4.0–10.3)
lymph#: 1 10*3/uL (ref 0.9–3.3)
nRBC: 0 % (ref 0–0)

## 2014-07-28 ENCOUNTER — Ambulatory Visit (HOSPITAL_BASED_OUTPATIENT_CLINIC_OR_DEPARTMENT_OTHER): Payer: Medicare Other | Admitting: Internal Medicine

## 2014-07-28 ENCOUNTER — Encounter: Payer: Self-pay | Admitting: Surgery

## 2014-07-28 ENCOUNTER — Telehealth: Payer: Self-pay | Admitting: Internal Medicine

## 2014-07-28 ENCOUNTER — Encounter: Payer: Self-pay | Admitting: Internal Medicine

## 2014-07-28 VITALS — BP 150/95 | HR 77 | Temp 98.2°F | Resp 18 | Ht 65.0 in | Wt 146.9 lb

## 2014-07-28 DIAGNOSIS — R05 Cough: Secondary | ICD-10-CM

## 2014-07-28 DIAGNOSIS — C3412 Malignant neoplasm of upper lobe, left bronchus or lung: Secondary | ICD-10-CM

## 2014-07-28 NOTE — Telephone Encounter (Signed)
Pt confirmed labs/ov per 12/08 POF, gave pt AVS.... KJ, gave pt barium

## 2014-07-28 NOTE — Telephone Encounter (Signed)
Lft msg for pt confirming of updated labs/ov per 12/08 POF, changed from June to April 2016 called by Radiology to r/s.... KJ

## 2014-07-28 NOTE — Progress Notes (Signed)
Jordan Mccullough  Telephone:([phone]) [phone] Fax:([phone]) [phone] OFFICE PROGRESS NOTE  Jordan Cobble, MD [address] [place] [address]  DIAGNOSIS: Stage IIIA (T2a., N2, M0) non-small cell lung cancer adenocarcinoma with negative EGFR mutation and negative ALK gene translocation involving the left upper lobe middle mediastinal lymphadenopathy diagnosed in August of 2014  Lung cancer   Primary site: Lung (Left)   Staging method: AJCC 7th Edition   Clinical: Stage IIIA (T2a, N2, M0) signed by [name], MD on 06/19/2013  5:06 PM   Summary: Stage IIIA (T2a, N2, M0).  Molecular biomarkers: Positive for [id number]*7, [id number] ATM splice site [id number]>LF TP53 E271K, SETD2 [id number] and SPOP E47K                                          Negative for: RET, ALK, BRAF, KRAS, ERBB2, MET and EGFR.  PRIOR THERAPY:  1) Status post left upper lobectomy with mediastinal lymph node dissection on 05/29/2013. 2) Adjuvant chemotherapy with cisplatin at 75 mg per meter squared and Alimta at 500 mg per meter squared given every 3 weeks for a total of 4 cycles. First cycle given on 07/10/2013 3) curative radiotherapy to the mediastinum under the care of Dr. [name] on 12/12/2013.  CURRENT THERAPY: Observation.  DISEASE STAGE: Lung cancer   Primary site: Lung (Left)   Staging method: AJCC 7th Edition   Clinical: Stage IIIA (T2a, N2, M0) signed by [name] on 06/19/2013  5:06 PM   Summary: Stage IIIA (T2a, N2, M0)  CHEMOTHERAPY INTENT: Control/curative  CURRENT # OF CHEMOTHERAPY CYCLES: 0  CURRENT ANTIEMETICS: Aloxi, dexamethasone, Emend, Compazine  CURRENT SMOKING STATUS: Former smoker  ORAL CHEMOTHERAPY AND CONSENT: n/a  CURRENT BISPHOSPHONATES USE: none  PAIN MANAGEMENT: 0/10  NARCOTICS INDUCED CONSTIPATION: None  LIVING WILL AND CODE STATUS: Full code   INTERVAL HISTORY: Jordan ECKERMAN89.o. male returns for 4 month followup visit  accompanied by his wife. The patient is feeling fine today with no specific complaints except for chest congestion and flulike symptoms and started last week. He has mild cough. He quit smoking. He denied having any significant nausea or vomiting, no fever or chills. The patient denied having any significant chest pain, shortness of breath, or hemoptysis. He had repeat CT scan of the chest performed recently and he is here for evaluation and discussion of his scan results.  MEDICAL HISTORY: Past Medical History  Diagnosis Date  . Hyperlipidemia   . Diverticulosis   . Coronary artery disease     MI 1992, S/P  PTCA; negative stress test in November 2011 with no ischemia.   . Peripheral vascular disease 1/14    4.8x4.6 cm infrarenal abdominal aortic fusiform aneurysm, 1.5 cm right common iliac artery aneurysm  . AAA (abdominal aortic aneurysm)   . Hearing loss     Bilateral   . Myocardial infarction 1992    Dr [name]  . History of radiation therapy 11/10/13-12/12/13    lung,50Gy/2561f. Lung cancer 10-14    non-small cell lung cancer  . Hypertension   . Shortness of breath     ALLERGIES:  is allergic to iodinated diagnostic agents.  MEDICATIONS:  Current Outpatient Prescriptions  Medication Sig Dispense Refill  . aspirin EC 81 MG tablet Take 81 mg by mouth daily.    . Glucosamine-Chondroit-Vit C-Mn (GLUCOSAMINE 1500  COMPLEX PO) Take 1,500 mg by mouth daily.     . metoprolol (LOPRESSOR) 50 MG tablet Take 1 tablet (50 mg total) by mouth 2 (two) times daily. 180 tablet 1  . Multiple Vitamin (MULTIVITAMIN) tablet Take 1 tablet by mouth every evening.     . rosuvastatin (CRESTOR) 40 MG tablet Take 1 tablet (40 mg total) by mouth daily. 30 tablet 5   No current facility-administered medications for this visit.    SURGICAL HISTORY:  Past Surgical History  Procedure Laterality Date  . Ptca  1992  . Inguinal heriiorrhaphy bilaterally    . Colonoscopy w/ polypectomy  2003      negative 2010,due 2020; Dr Olevia Perches  . Pilonidal cyst excision    . Rotator cuff repair      Bilateral  . Cystoscopy/retrograde/ureteroscopy Bilateral 10/09/2012    Procedure: BILATERAL RETROGRADE bladder and urethral BIOPSY ;  Surgeon: Molli Hazard, MD;  Location: [facility];  Service: Urology;  Laterality: Bilateral;  BILATERAL RETROGRADE  AND bladder and urethral BIOPSY    . Prostate biopsy N/A 10/09/2012    Procedure: PROSTATIC URETHRAL BIOPSY;  Surgeon: Molli Hazard, MD;  Location: [facility];  Service: Urology;  Laterality: N/A;  PROSTATIC URETHRAL BIOPSY    . Hernia repair Bilateral     Inguinal  . Mediastinoscopy N/A 05/01/2013    Procedure: MEDIASTINOSCOPY;  Surgeon: Gaye Pollack, MD;  Location: [facility] OR;  Service: Thoracic;  Laterality: N/A;  . Video bronchoscopy N/A 05/01/2013    Procedure: VIDEO BRONCHOSCOPY;  Surgeon: Gaye Pollack, MD;  Location: [facility] OR;  Service: Thoracic;  Laterality: N/A;  . Coronary angioplasty      no stents  . Thorocotomy with lobectomy Left 05/29/2013    Procedure: LEFT THOROCOTOMY WITH LEFT UPPER LOBE LOBECTOMY;  Surgeon: Gaye Pollack, MD;  Location: [facility] OR;  Service: Thoracic;  Laterality: Left;  . Abdominal aortic endovascular stent graft N/A 05/07/2014    Procedure: ABDOMINAL AORTIC ENDOVASCULAR STENT GRAFT;  Surgeon: Serafina Mitchell, MD;  Location: [facility];  Service: Vascular;  Laterality: N/A;    REVIEW OF SYSTEMS:  A comprehensive review of systems was negative except for: Respiratory: positive for cough   PHYSICAL EXAMINATION: General appearance: alert, cooperative, appears stated age and no distress Head: Normocephalic, without obvious abnormality, atraumatic Neck: no adenopathy, no carotid bruit, no JVD, supple, symmetrical, trachea midline and thyroid not enlarged, symmetric, no tenderness/mass/nodules Lymph nodes: Cervical, supraclavicular, and axillary nodes normal. Resp: clear to auscultation bilaterally Cardio: S1, S2 normal and  Occasional missed/skipped beat(s) GI: soft, non-tender; bowel sounds normal; no masses,  no organomegaly Extremities: extremities normal, atraumatic, no cyanosis or edema Neurologic: Alert and oriented X 3, normal strength and tone. Normal symmetric reflexes. Normal coordination and gait  ECOG PERFORMANCE STATUS: 1 - Symptomatic but completely ambulatory  Blood pressure 150/95, pulse 77, temperature 98.2 F (36.8 C), temperature source Oral, resp. rate 18, height _0  (1.651 m), weight 146 lb 14.4 oz (66.633 kg), SpO2 99 %.  LABORATORY DATA: Lab Results  Component Value Date   WBC 5.3 07/21/2014   HGB 13.7 07/21/2014   HCT 41.0 07/21/2014   MCV 88.6 07/21/2014   PLT 209 07/21/2014      Chemistry      Component Value Date/Time   NA 138 07/21/2014 1154   NA 137 05/08/2014 0500   K 4.8 07/21/2014 1154   K 4.1 05/08/2014 0500   CL 100 05/08/2014 0500   CO2 29 07/21/2014  1154   CO2 24 05/08/2014 0500   BUN 12.7 07/21/2014 1154   BUN 9 05/08/2014 0500   CREATININE 0.9 07/21/2014 1154   CREATININE 0.68 05/08/2014 0500   CREATININE 0.78 04/09/2014 0953      Component Value Date/Time   CALCIUM 9.7 07/21/2014 1154   CALCIUM 8.6 05/08/2014 0500   ALKPHOS 71 07/21/2014 1154   ALKPHOS 68 05/05/2014 1055   AST 23 07/21/2014 1154   AST 27 05/05/2014 1055   ALT 34 07/21/2014 1154   ALT 35 05/05/2014 1055   BILITOT 0.44 07/21/2014 1154   BILITOT 0.3 05/05/2014 1055       RADIOGRAPHIC STUDIES: Ct Chest Wo Contrast  07/21/2014   CLINICAL DATA:  73 year old male with history of lung cancer status post left upper lobectomy, chemotherapy and radiation therapy, now complete.  EXAM: CT CHEST WITHOUT CONTRAST  TECHNIQUE: Multidetector CT imaging of the chest was performed following the standard protocol without IV contrast.  COMPARISON:  Chest CT 04/14/2014.  FINDINGS: Mediastinum: Heart size is normal. Small amount of pericardial fluid and/or thickening, unlikely to be of hemodynamic  significance at this time. No associated pericardial calcification. There is atherosclerosis of the thoracic aorta, the great vessels of the mediastinum and the coronary arteries, including calcified atherosclerotic plaque in the left main, left anterior descending, left circumflex and right coronary arteries. No pathologically enlarged mediastinal or hilar lymph nodes. Please note that accurate exclusion of hilar adenopathy is limited on noncontrast CT scans. Esophagus is unremarkable in appearance.  Lungs/Pleura: Status post left upper lobectomy. Compensatory hyperexpansion of the left lower lobe. Chronic areas of mild ground-glass attenuation and septal thickening in the perihilar aspect of the left lower lobe, compatible with evolving postradiation changes. No suspicious appearing pulmonary nodule or mass to suggest locally recurrent disease. Contralateral lung is clear. Trace chronic left pleural effusion is unchanged. No right pleural effusion.  Upper Abdomen: Aortic stent graft incompletely visualized in the infrarenal abdominal aorta. Multiple left renal lesions, incompletely visualized and incompletely characterized on today's noncontrast CT examination, but similar to prior studies at which time they were characterized as simple cysts.  Musculoskeletal: There are no aggressive appearing lytic or blastic lesions noted in the visualized portions of the skeleton.  IMPRESSION: 1. Stable postoperative and post procedural changes of left upper lobectomy and left-sided radiation therapy, without findings to suggest local recurrence of disease or metastatic disease in the thorax at this time. 2. Atherosclerosis, including left main and 3 vessel coronary artery disease. Assessment for potential risk factor modification, dietary therapy or pharmacologic therapy may be warranted, if clinically indicated. 3. Small amount of pericardial fluid and/or thickening on today's examination, new compared to the prior study.  This is of uncertain etiology, but unlikely to be of hemodynamic significance at this time.   Electronically Signed   By: Vinnie Langton M.D.   On: 07/21/2014 15:27   ASSESSMENT/PLAN: This is a very pleasant 73 year old Caucasian male recently diagnosed with stage IIIA (T2a., N2, M0) non-small cell lung cancer, adenocarcinoma involving the left upper lobe and mediastinal lymphadenopathy diagnosed in August of 2014. He is status post left upper lobectomy with mediastinal lymph node dissection on 05/29/2013. He is status post 4 cycles of adjuvant chemotherapy with cisplatin and Alimta. The patient has been on observation for the last 10 months and he has no evidence for disease recurrence on the recent scan. I recommended for him to continue on observation with repeat CT scan of the chest in 4 months.  He was advised to call immediately if he has any concerning symptoms in the interval. All questions were answered. The patient knows to call the clinic with any problems, questions or concerns. We can certainly see the patient much sooner if necessary.  Disclaimer: This note was dictated with voice recognition software. Similar sounding words can inadvertently be transcribed and may not be corrected upon review.

## 2014-07-29 ENCOUNTER — Other Ambulatory Visit: Payer: Self-pay | Admitting: *Deleted

## 2014-07-29 DIAGNOSIS — Z95828 Presence of other vascular implants and grafts: Secondary | ICD-10-CM

## 2014-09-07 ENCOUNTER — Encounter: Payer: Self-pay | Admitting: Internal Medicine

## 2014-10-05 ENCOUNTER — Other Ambulatory Visit: Payer: Self-pay | Admitting: Medical Oncology

## 2014-10-05 ENCOUNTER — Encounter: Payer: Self-pay | Admitting: Surgery

## 2014-10-05 ENCOUNTER — Telehealth: Payer: Self-pay | Admitting: Internal Medicine

## 2014-10-05 NOTE — Telephone Encounter (Signed)
, °

## 2014-10-23 ENCOUNTER — Encounter: Payer: Self-pay | Admitting: Internal Medicine

## 2014-10-23 ENCOUNTER — Ambulatory Visit (INDEPENDENT_AMBULATORY_CARE_PROVIDER_SITE_OTHER)
Admission: RE | Admit: 2014-10-23 | Discharge: 2014-10-23 | Disposition: A | Discharge status: Home / Self Care | Payer: Medicare Other | Source: Ambulatory Visit | Attending: Internal Medicine | Admitting: Internal Medicine

## 2014-10-23 ENCOUNTER — Ambulatory Visit (INDEPENDENT_AMBULATORY_CARE_PROVIDER_SITE_OTHER): Payer: Medicare Other | Admitting: Internal Medicine

## 2014-10-23 VITALS — BP 140/96 | HR 74 | Temp 98.4°F | Ht 65.0 in | Wt 157.5 lb

## 2014-10-23 DIAGNOSIS — R05 Cough: Secondary | ICD-10-CM | POA: Diagnosis not present

## 2014-10-23 DIAGNOSIS — J209 Acute bronchitis, unspecified: Secondary | ICD-10-CM

## 2014-10-23 DIAGNOSIS — R1314 Dysphagia, pharyngoesophageal phase: Secondary | ICD-10-CM | POA: Diagnosis not present

## 2014-10-23 DIAGNOSIS — I10 Essential (primary) hypertension: Secondary | ICD-10-CM

## 2014-10-23 MED ORDER — PREDNISONE 20 MG PO TABS: 20.0000 mg | ORAL_TABLET | Freq: Two times a day (BID) | ORAL | Status: DC

## 2014-10-23 MED ORDER — AMLODIPINE BESYLATE 5 MG PO TABS: 5.0000 mg | ORAL_TABLET | Freq: Every day | ORAL | Status: DC

## 2014-10-23 MED ORDER — AZITHROMYCIN 250 MG PO TABS: ORAL_TABLET | ORAL | Status: DC

## 2014-10-23 NOTE — Patient Instructions (Addendum)
Symbicort 80/4.5 two inhalations daily; gargle and spit after use. Lot #: [id number] Exp 08/2015   Minimal Blood Pressure Goal= AVERAGE < 140/90;  Ideal is an AVERAGE < 135/85. This AVERAGE should be calculated from @ least 5-7 BP readings taken @ different times of day on different days of week. You should not respond to isolated BP readings , but rather the AVERAGE for that week .Please bring your  blood pressure cuff to office visits to verify that it is reliable.It  can also be checked against the blood pressure device at the pharmacy. Finger or wrist cuffs are not dependable; an arm cuff is.  Plain Mucinex (NOT D) for thick secretions ;force NON dairy fluids .   Nasal cleansing in the shower as discussed with lather of mild shampoo.After 10 seconds wash off lather while  exhaling through nostrils. Make sure that all residual soap is removed to prevent irritation.  Flonase OR Nasacort AQ 1 spray in each nostril twice a day as needed. Use the "crossover" technique into opposite nostril spraying toward opposite ear @ 45 degree angle, not straight up into nostril.  Plain Allegra (NOT D )  160 daily , Loratidine 10 mg , OR Zyrtec 10 mg @ bedtime  as needed for itchy eyes & sneezing.  Please do not use Q-tips as this simply packs the wax down against he eardrum. Should wax build up occur, please put 2-3 drops of mineral oil in the ear at night and cover the canal with a  cotton ball.In the morning fill the canal with hydrogen peroxide & leave  for 10-15 minutes.Following this shower and use the thinnest washrag available to wick out the wax.

## 2014-10-23 NOTE — Progress Notes (Signed)
Pre visit review using our clinic review tool, if applicable. No additional management support is needed unless otherwise documented below in the visit note. 

## 2014-10-23 NOTE — Progress Notes (Signed)
   Subjective:    Patient ID: Jordan Mccullough, male    DOB: 07-16-1941, 74 y.o.   MRN: [id number]  HPI  He has had a cough for 3 weeks which is stable. It has been associated with some pleuritic component when he takes a deep breath with paroxysms of coughing which can last 2-3 minutes. Has produced scant, thin sputum. The cough is initiated as a tickle in his throat. Ibuprofen does help that. He is not on an ACE inhibitor.   He does have some nasal obstruction but all secretions are clear.  He has hoardeness and intermittent dysphagia in the context of having had vocal cord paralysis unilaterally as a complication of resection of lung cancer. He is unaware definite aspiration. Also post operatively he has had chronic elevation of the L hemidiaphragm.He has no history of asthma.  He denies other symptoms of upper respiratory tract infection or GERD.   Blood pressure at home monitors 140/95-100. He is on treadmill for 30 minutes 2 times a week. He is also on low-salt diet.  Review of Systems Frontal headache, facial pain , nasal purulence, dental pain, sore throat , otic pain or otic discharge denied. No fever , chills or sweats. Unexplained weight loss, abdominal pain, significant dyspepsia, melena, rectal bleeding, or persistently small caliber stools are denied.     Objective:   Physical Exam  Pertinent or positive findings include : He is hoarse.  He has wax in both ears obscuring the tympanic membranes.  Nares are boggy and edematous.  He has low-grade expiratory rhonchi as well as scattered pops and wheezes  Symmetrically.   General appearance:Thin but adequately nourished; no acute distress or increased work of breathing is present.  No  lymphadenopathy about the head, neck, or axilla noted.  Eyes: No conjunctival inflammation or lid edema is present. There is no scleral icterus. Ears:  External ear exam shows no significant lesions or deformities.   Nose:  External nasal  examination shows no deformity or inflammation. No septal dislocation or deviation.Slight obstruction to airflow.  Oral exam: Dental hygiene is good; lips and gums are healthy appearing.There is no oropharyngeal erythema or exudate noted.  Neck:  No deformities, thyromegaly, masses, or tenderness noted.   Supple with full range of motion without pain.  Heart:  Normal rate and regular rhythm. S1 and S2 normal without gallop, murmur, click, rub or other extra sounds.  Extremities:  No cyanosis, edema, or clubbing  noted  Skin: Warm & dry w/o jaundice or tenting.      Assessment & Plan:  #1 asthmatic bronchitis   #2 nonallergic rhinitis.  #3 vocal cord paralysis with hoarseness and some dysphagia #4 suboptimal blood pressure control #5 cerumen impactions See orders

## 2014-11-05 DIAGNOSIS — J3800 Paralysis of vocal cords and larynx, unspecified: Secondary | ICD-10-CM | POA: Diagnosis not present

## 2014-11-05 DIAGNOSIS — R490 Dysphonia: Secondary | ICD-10-CM | POA: Diagnosis not present

## 2014-11-23 ENCOUNTER — Other Ambulatory Visit: Payer: Self-pay | Admitting: Internal Medicine

## 2014-11-25 ENCOUNTER — Ambulatory Visit (HOSPITAL_COMMUNITY): Payer: Medicare Other

## 2014-11-25 ENCOUNTER — Other Ambulatory Visit: Payer: Medicare Other

## 2014-11-30 ENCOUNTER — Ambulatory Visit: Payer: Medicare Other | Admitting: Surgery

## 2014-11-30 ENCOUNTER — Ambulatory Visit: Payer: Medicare Other | Admitting: Internal Medicine

## 2014-12-07 ENCOUNTER — Ambulatory Visit (HOSPITAL_COMMUNITY)
Admission: RE | Admit: 2014-12-07 | Discharge: 2014-12-07 | Disposition: A | Discharge status: Home / Self Care | Payer: Medicare Other | Source: Ambulatory Visit | Attending: Surgery | Admitting: Surgery

## 2014-12-07 ENCOUNTER — Ambulatory Visit (HOSPITAL_COMMUNITY)
Admission: RE | Admit: 2014-12-07 | Discharge: 2014-12-07 | Disposition: A | Discharge status: Home / Self Care | Payer: Medicare Other | Source: Ambulatory Visit | Attending: Internal Medicine | Admitting: Internal Medicine

## 2014-12-07 ENCOUNTER — Other Ambulatory Visit (HOSPITAL_BASED_OUTPATIENT_CLINIC_OR_DEPARTMENT_OTHER): Payer: Medicare Other

## 2014-12-07 DIAGNOSIS — C3412 Malignant neoplasm of upper lobe, left bronchus or lung: Secondary | ICD-10-CM

## 2014-12-07 DIAGNOSIS — Z95828 Presence of other vascular implants and grafts: Secondary | ICD-10-CM

## 2014-12-07 DIAGNOSIS — I714 Abdominal aortic aneurysm, without rupture, unspecified: Secondary | ICD-10-CM

## 2014-12-07 DIAGNOSIS — Z48812 Encounter for surgical aftercare following surgery on the circulatory system: Secondary | ICD-10-CM

## 2014-12-07 LAB — COMPREHENSIVE METABOLIC PANEL (CC13)
ALT: 34 U/L (ref 0–55)
AST: 24 U/L (ref 5–34)
Albumin: 3.8 g/dL (ref 3.5–5.0)
Alkaline Phosphatase: 64 U/L (ref 40–150)
Anion Gap: 10 mEq/L (ref 3–11)
BUN: 14.1 mg/dL (ref 7.0–26.0)
CO2: 24 mEq/L (ref 22–29)
Calcium: 9.2 mg/dL (ref 8.4–10.4)
Chloride: 105 mEq/L (ref 98–109)
Creatinine: 0.9 mg/dL (ref 0.7–1.3)
EGFR: 86 mL/min/{1.73_m2} — ABNORMAL LOW (ref >90)
Glucose: 121 mg/dl (ref 70–140)
Potassium: 4.5 mEq/L (ref 3.5–5.1)
Sodium: 140 mEq/L (ref 136–145)
Total Bilirubin: 0.32 mg/dL (ref 0.20–1.20)
Total Protein: 7.2 g/dL (ref 6.4–8.3)

## 2014-12-07 LAB — CBC WITH DIFFERENTIAL/PLATELET
BASO%: 0.4 % (ref 0.0–2.0)
Basophils Absolute: 0 10*3/uL (ref 0.0–0.1)
EOS%: 2.9 % (ref 0.0–7.0)
Eosinophils Absolute: 0.2 10*3/uL (ref 0.0–0.5)
HCT: 41.5 % (ref 38.4–49.9)
HGB: 14.1 g/dL (ref 13.0–17.1)
LYMPH%: 19.1 % (ref 14.0–49.0)
MCH: 29.6 pg (ref 27.2–33.4)
MCHC: 34 g/dL (ref 32.0–36.0)
MCV: 87.2 fL (ref 79.3–98.0)
MONO#: 0.6 10*3/uL (ref 0.1–0.9)
MONO%: 11.2 % (ref 0.0–14.0)
NEUT#: 3.4 10*3/uL (ref 1.5–6.5)
NEUT%: 66.4 % (ref 39.0–75.0)
Platelets: 214 10*3/uL (ref 140–400)
RBC: 4.76 10*6/uL (ref 4.20–5.82)
RDW: 14.4 % (ref 11.0–14.6)
WBC: 5.2 10*3/uL (ref 4.0–10.3)
lymph#: 1 10*3/uL (ref 0.9–3.3)

## 2014-12-08 ENCOUNTER — Encounter (HOSPITAL_COMMUNITY): Payer: Self-pay

## 2014-12-08 ENCOUNTER — Ambulatory Visit (HOSPITAL_COMMUNITY)
Admission: RE | Admit: 2014-12-08 | Discharge: 2014-12-08 | Disposition: A | Discharge status: Home / Self Care | Payer: Medicare Other | Source: Ambulatory Visit | Attending: Internal Medicine | Admitting: Internal Medicine

## 2014-12-08 DIAGNOSIS — Z9889 Other specified postprocedural states: Secondary | ICD-10-CM | POA: Diagnosis not present

## 2014-12-08 DIAGNOSIS — Z9221 Personal history of antineoplastic chemotherapy: Secondary | ICD-10-CM | POA: Insufficient documentation

## 2014-12-08 DIAGNOSIS — I313 Pericardial effusion (noninflammatory): Secondary | ICD-10-CM | POA: Insufficient documentation

## 2014-12-08 DIAGNOSIS — Z85118 Personal history of other malignant neoplasm of bronchus and lung: Secondary | ICD-10-CM | POA: Insufficient documentation

## 2014-12-08 DIAGNOSIS — I712 Thoracic aortic aneurysm, without rupture: Secondary | ICD-10-CM | POA: Diagnosis not present

## 2014-12-08 DIAGNOSIS — I701 Atherosclerosis of renal artery: Secondary | ICD-10-CM | POA: Diagnosis not present

## 2014-12-08 DIAGNOSIS — J90 Pleural effusion, not elsewhere classified: Secondary | ICD-10-CM | POA: Insufficient documentation

## 2014-12-08 DIAGNOSIS — N281 Cyst of kidney, acquired: Secondary | ICD-10-CM | POA: Diagnosis not present

## 2014-12-08 MED ORDER — IOHEXOL 350 MG/ML SOLN
100.0000 mL | Freq: Once | INTRAVENOUS | Status: AC | PRN
  Administered 2014-12-08: 100 mL via INTRAVENOUS

## 2014-12-10 ENCOUNTER — Other Ambulatory Visit: Payer: Medicare Other

## 2014-12-10 ENCOUNTER — Ambulatory Visit (HOSPITAL_BASED_OUTPATIENT_CLINIC_OR_DEPARTMENT_OTHER): Payer: Medicare Other | Admitting: Internal Medicine

## 2014-12-10 ENCOUNTER — Encounter: Payer: Self-pay | Admitting: Internal Medicine

## 2014-12-10 ENCOUNTER — Encounter: Payer: Self-pay | Admitting: *Deleted

## 2014-12-10 ENCOUNTER — Telehealth: Payer: Self-pay | Admitting: Internal Medicine

## 2014-12-10 VITALS — BP 147/96 | HR 77 | Temp 98.4°F | Resp 18 | Ht 65.0 in | Wt 156.0 lb

## 2014-12-10 DIAGNOSIS — C3412 Malignant neoplasm of upper lobe, left bronchus or lung: Secondary | ICD-10-CM

## 2014-12-10 DIAGNOSIS — Z85118 Personal history of other malignant neoplasm of bronchus and lung: Secondary | ICD-10-CM | POA: Diagnosis not present

## 2014-12-10 LAB — RESEARCH LABS

## 2014-12-10 NOTE — Telephone Encounter (Signed)
Gave avs & calendar for October. °

## 2014-12-10 NOTE — Progress Notes (Signed)
[place]  [phone] [phone] OFFICE PROGRESS NOTE  Jordan Cobble, MD [address] [place] [address]  DIAGNOSIS: Stage IIIA (T2a., N2, M0) non-small cell lung cancer adenocarcinoma with negative EGFR mutation and negative ALK gene translocation involving the left upper lobe middle mediastinal lymphadenopathy diagnosed in August of 2014  Lung cancer   Primary site: Lung (Left)   Staging method: AJCC 7th Edition   Clinical: Stage IIIA (T2a, N2, M0) signed by Jordan Bears, MD on 06/19/2013  5:06 PM   Summary: Stage IIIA (T2a, N2, M0).  Molecular biomarkers: Positive for [id number], [id number] ATM splice site [id number] TP53 E271K, SETD2 [id number] and SPOP E47K                                          Negative for: RET, ALK, BRAF, KRAS, ERBB2, MET and EGFR.  PRIOR THERAPY:  1) Status post left upper lobectomy with mediastinal lymph node dissection on 05/29/2013. 2) Adjuvant chemotherapy with cisplatin at 75 mg per meter squared and Alimta at 500 mg per meter squared given every 3 weeks for a total of 4 cycles. First cycle given on 07/10/2013 3) curative radiotherapy to the mediastinum under the care of Dr. [name] on 12/12/2013.  CURRENT THERAPY: Observation.  DISEASE STAGE: Lung cancer   Primary site: Lung (Left)   Staging method: AJCC 7th Edition   Clinical: Stage IIIA (T2a, N2, M0) signed by Jordan Mccullough on 06/19/2013  5:06 PM   Summary: Stage IIIA (T2a, N2, M0)  CHEMOTHERAPY INTENT: Control/curative  CURRENT # OF CHEMOTHERAPY CYCLES: 0  CURRENT ANTIEMETICS: Aloxi, dexamethasone, Emend, Compazine  CURRENT SMOKING STATUS: Former smoker  ORAL CHEMOTHERAPY AND CONSENT: n/a  CURRENT BISPHOSPHONATES USE: none  PAIN MANAGEMENT: 0/10  NARCOTICS INDUCED CONSTIPATION: None  LIVING WILL AND CODE STATUS: Full code   INTERVAL HISTORY: Jordan [id number]. male returns for 4 month followup visit  accompanied by his wife. The patient is feeling fine today with no specific complaints. 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 He denied having any significant nausea or vomiting, no fever or chills. The patient denied having any significant chest pain, shortness of breath, or hemoptysis. He had repeat CT scan of the chest performed recently and he is here for evaluation and discussion of his scan results.  MEDICAL HISTORY: Past Medical History  Diagnosis Date  . Hyperlipidemia   . Diverticulosis   . Coronary artery disease     MI 1992, S/P  PTCA; negative stress test in November 2011 with no ischemia.   . Peripheral vascular disease 1/14    4.8x4.6 cm infrarenal abdominal aortic fusiform aneurysm, 1.5 cm right common iliac artery aneurysm  . AAA (abdominal aortic aneurysm)   . Hearing loss     Bilateral   . Myocardial infarction 1992    Dr [name]  . History of radiation therapy 11/10/13-12/12/13    lung,50Gy/252f. Lung cancer 10-14    non-small cell lung cancer  . Hypertension   . Shortness of breath     ALLERGIES:  is allergic to iodinated diagnostic agents.  MEDICATIONS:  Current Outpatient Prescriptions  Medication Sig Dispense Refill  . amLODipine (NORVASC) 5 MG tablet Take 1 tablet (5 mg total) by mouth daily. 30 tablet 5  . aspirin EC 81 MG tablet Take 81 mg by  mouth daily.    . CRESTOR 40 MG tablet TAKE 1 TABLET (40 MG TOTAL) BY MOUTH DAILY. 30 tablet 5  . Glucosamine-Chondroit-Vit C-Mn (GLUCOSAMINE 1500 COMPLEX PO) Take 1,500 mg by mouth daily.     . metoprolol (LOPRESSOR) 50 MG tablet Take 1 tablet (50 mg total) by mouth 2 (two) times daily. 180 tablet 1  . Multiple Vitamin (MULTIVITAMIN) tablet Take 1 tablet by mouth every evening.      No current facility-administered medications for this visit.    SURGICAL HISTORY:  Past Surgical History  Procedure Laterality Date  . Ptca  1992  . Inguinal heriiorrhaphy bilaterally    . Colonoscopy w/ polypectomy   2003     negative 2010,due 2020; Dr Jordan Mccullough  . Pilonidal cyst excision    . Rotator cuff repair      Bilateral  . Cystoscopy/retrograde/ureteroscopy Bilateral 10/09/2012    Procedure: BILATERAL RETROGRADE bladder and urethral BIOPSY ;  Surgeon: Molli Hazard, MD;  Location: [facility];  Service: Urology;  Laterality: Bilateral;  BILATERAL RETROGRADE  AND bladder and urethral BIOPSY    . Prostate biopsy N/A 10/09/2012    Procedure: PROSTATIC URETHRAL BIOPSY;  Surgeon: Molli Hazard, MD;  Location: [facility];  Service: Urology;  Laterality: N/A;  PROSTATIC URETHRAL BIOPSY    . Hernia repair Bilateral     Inguinal  . Mediastinoscopy N/A 05/01/2013    Procedure: MEDIASTINOSCOPY;  Surgeon: Jordan Pollack, MD;  Location: [facility] OR;  Service: Thoracic;  Laterality: N/A;  . Video bronchoscopy N/A 05/01/2013    Procedure: VIDEO BRONCHOSCOPY;  Surgeon: Jordan Pollack, MD;  Location: [facility] OR;  Service: Thoracic;  Laterality: N/A;  . Coronary angioplasty      no stents  . Thorocotomy with lobectomy Left 05/29/2013    Procedure: LEFT THOROCOTOMY WITH LEFT UPPER LOBE LOBECTOMY;  Surgeon: Jordan Pollack, MD;  Location: [facility] OR;  Service: Thoracic;  Laterality: Left;  . Abdominal aortic endovascular stent graft N/A 05/07/2014    Procedure: ABDOMINAL AORTIC ENDOVASCULAR STENT GRAFT;  Surgeon: Jordan Mitchell, MD;  Location: [facility];  Service: Vascular;  Laterality: N/A;    REVIEW OF SYSTEMS:  A comprehensive review of systems was negative.   PHYSICAL EXAMINATION: General appearance: alert, cooperative, appears stated age and no distress Head: Normocephalic, without obvious abnormality, atraumatic Neck: no adenopathy, no carotid bruit, no JVD, supple, symmetrical, trachea midline and thyroid not enlarged, symmetric, no tenderness/mass/nodules Lymph nodes: Cervical, supraclavicular, and axillary nodes normal. Resp: clear to auscultation bilaterally Cardio: S1, S2 normal and Occasional missed/skipped  beat(s) GI: soft, non-tender; bowel sounds normal; no masses,  no organomegaly Extremities: extremities normal, atraumatic, no cyanosis or edema Neurologic: Alert and oriented X 3, normal strength and tone. Normal symmetric reflexes. Normal coordination and gait  ECOG PERFORMANCE STATUS: 1 - Symptomatic but completely ambulatory  There were no vitals taken for this visit.  LABORATORY DATA: Lab Results  Component Value Date   WBC 5.2 12/07/2014   HGB 14.1 12/07/2014   HCT 41.5 12/07/2014   MCV 87.2 12/07/2014   PLT 214 12/07/2014      Chemistry      Component Value Date/Time   NA 140 12/07/2014 0916   NA 137 05/08/2014 0500   K 4.5 12/07/2014 0916   K 4.1 05/08/2014 0500   CL 100 05/08/2014 0500   CO2 24 12/07/2014 0916   CO2 24 05/08/2014 0500   BUN 14.1 12/07/2014 0916   BUN 9 05/08/2014 0500  CREATININE 0.9 12/07/2014 0916   CREATININE 0.68 05/08/2014 0500   CREATININE 0.78 04/09/2014 0953      Component Value Date/Time   CALCIUM 9.2 12/07/2014 0916   CALCIUM 8.6 05/08/2014 0500   ALKPHOS 64 12/07/2014 0916   ALKPHOS 68 05/05/2014 1055   AST 24 12/07/2014 0916   AST 27 05/05/2014 1055   ALT 34 12/07/2014 0916   ALT 35 05/05/2014 1055   BILITOT 0.32 12/07/2014 0916   BILITOT 0.3 05/05/2014 1055       RADIOGRAPHIC STUDIES: Ct Chest W Contrast  12/08/2014   CLINICAL DATA:  Lung ca dx'd 05/2013, chemo and xrt complete 2015, lt. Upper lobectomy, aneurysm repair 12/2013/hx of endovascular stent graft, pt.states no chest/abd/pelvic complaints  EXAM: CT CHEST WITH CONTRAST  CT ANGIOGRAPHY ABDOMEN AND PELVIS  TECHNIQUE: Multidetector CT imaging of the abdomen and pelvis was performed using the standard protocol during bolus administration of intravenous contrast. Multiplanar CT image reconstructions and MIPs were obtained to evaluate the vascular anatomy. Multidetector CT imaging of the chest was performed using the standard protocol during bolus administration of  intravenous contrast.  CONTRAST:  156m OMNIPAQUE IOHEXOL 350 MG/ML SOLN  COMPARISON:  07/21/2014 and earlier studies  FINDINGS: CT CHEST  Scattered coronary calcifications. Moderate calcified plaque in the aortic arch and descending thoracic aorta without aneurysm, dissection, or stenosis. Stable moderate pericardial effusion. Trace left pleural effusion stable. Surgical clips at the left hilum. No hilar or mediastinal adenopathy. Stable left perihilar septal thickening. Lungs are otherwise clear. Stable mild superior endplate deformities at T8, T11, and T12. Sternum intact.  CTA  ABDOMEN and PELVIS  Arterial findings:  Aorta: Mild plaque in the suprarenal segment. Stable patent bifurcated juxtarenal aortic stent graft. Persistent type 2 endoleak related to patent IMA. Native aneurysm sac 5.4 x 5.2 cm (previously 5.1 x 5).  Celiac axis:         Patent  Superior mesenteric: Patent  Left renal: Single. Stent tines approach the ostium without high-grade stenosis.  Right renal: There are 3. Stent tines approach the dominant superior right renal artery, of doubtful hemodynamic significance. Two aberrant smaller renal arteries from the distal aorta are occluded proximally, supply the lower pole. These enhance distally probably secondary to some collateral or capsular supply. No definite association with the endoleak  Inferior mesenteric: Persistently patent, contributing to the type 2 endoleak.  Left iliac: Left limb of stent graft extends to the mid common iliac. The distal common iliac is ectatic up to 17 mm diameter. Proximal internal iliac measures up to 14 mm diameter. There is nonocclusive plaque in the proximal external iliac which remains patent.  Right iliac: Right limb of stent graft is patent, extends to the distal common iliac, stent tines incompletely apposed. There is a short segment dissection in the distal common iliac, with without flow limitation. Internal and external iliac arteries have scattered  nonocclusive calcified plaque proximally and are ectatic.  Venous findings: Patent hepatic veins, portal vein, superior mesenteric vein, splenic vein, bilateral renal veins, IVC, and iliac venous system.  Review of the MIP images confirms the above findings.  Nonvascular findings: Unremarkable liver, spleen, adrenal glands, pancreas. Partial calcified calculi in the dependent aspect of the nondilated gallbladder. Bilateral renal cysts, largest on the left in the lower pole 3 cm. There is decreased enhancement in the lower pole right kidney related to the compromised aberrant renal arteries. No hydronephrosis. Stomach, small bowel, and colon are nondilated. Normal appendix. Scattered descending and sigmoid diverticula  without adjacent inflammatory/ edematous change. Urinary bladder incompletely distended, thick-walled. Moderate prostatic enlargement. No ascites. No free air. No adenopathy localized. Lumbar spine intact.  IMPRESSION: 1. Stable left lung postop changes. No new nodule, mass or adenopathy. 2. Persistent type 2 endoleak related to patent IMA, with increase in size of aneurysm sac diameter from 5.1 to 5.4 cm. 3. Stable small left pleural and pericardial effusions.   Electronically Signed   By: Lucrezia Europe M.D.   On: 12/08/2014 09:41   Ct Angio Abd/pel W/ And/or W/o  12/08/2014   CLINICAL DATA:  Lung ca dx'd 05/2013, chemo and xrt complete 2015, lt. Upper lobectomy, aneurysm repair 12/2013/hx of endovascular stent graft, pt.states no chest/abd/pelvic complaints  EXAM: CT CHEST WITH CONTRAST  CT ANGIOGRAPHY ABDOMEN AND PELVIS  TECHNIQUE: Multidetector CT imaging of the abdomen and pelvis was performed using the standard protocol during bolus administration of intravenous contrast. Multiplanar CT image reconstructions and MIPs were obtained to evaluate the vascular anatomy. Multidetector CT imaging of the chest was performed using the standard protocol during bolus administration of intravenous contrast.   CONTRAST:  171m OMNIPAQUE IOHEXOL 350 MG/ML SOLN  COMPARISON:  07/21/2014 and earlier studies  FINDINGS: CT CHEST  Scattered coronary calcifications. Moderate calcified plaque in the aortic arch and descending thoracic aorta without aneurysm, dissection, or stenosis. Stable moderate pericardial effusion. Trace left pleural effusion stable. Surgical clips at the left hilum. No hilar or mediastinal adenopathy. Stable left perihilar septal thickening. Lungs are otherwise clear. Stable mild superior endplate deformities at T8, T11, and T12. Sternum intact.  CTA  ABDOMEN and PELVIS  Arterial findings:  Aorta: Mild plaque in the suprarenal segment. Stable patent bifurcated juxtarenal aortic stent graft. Persistent type 2 endoleak related to patent IMA. Native aneurysm sac 5.4 x 5.2 cm (previously 5.1 x 5).  Celiac axis:         Patent  Superior mesenteric: Patent  Left renal: Single. Stent tines approach the ostium without high-grade stenosis.  Right renal: There are 3. Stent tines approach the dominant superior right renal artery, of doubtful hemodynamic significance. Two aberrant smaller renal arteries from the distal aorta are occluded proximally, supply the lower pole. These enhance distally probably secondary to some collateral or capsular supply. No definite association with the endoleak  Inferior mesenteric: Persistently patent, contributing to the type 2 endoleak.  Left iliac: Left limb of stent graft extends to the mid common iliac. The distal common iliac is ectatic up to 17 mm diameter. Proximal internal iliac measures up to 14 mm diameter. There is nonocclusive plaque in the proximal external iliac which remains patent.  Right iliac: Right limb of stent graft is patent, extends to the distal common iliac, stent tines incompletely apposed. There is a short segment dissection in the distal common iliac, with without flow limitation. Internal and external iliac arteries have scattered nonocclusive calcified  plaque proximally and are ectatic.  Venous findings: Patent hepatic veins, portal vein, superior mesenteric vein, splenic vein, bilateral renal veins, IVC, and iliac venous system.  Review of the MIP images confirms the above findings.  Nonvascular findings: Unremarkable liver, spleen, adrenal glands, pancreas. Partial calcified calculi in the dependent aspect of the nondilated gallbladder. Bilateral renal cysts, largest on the left in the lower pole 3 cm. There is decreased enhancement in the lower pole right kidney related to the compromised aberrant renal arteries. No hydronephrosis. Stomach, small bowel, and colon are nondilated. Normal appendix. Scattered descending and sigmoid diverticula without adjacent inflammatory/ edematous  change. Urinary bladder incompletely distended, thick-walled. Moderate prostatic enlargement. No ascites. No free air. No adenopathy localized. Lumbar spine intact.  IMPRESSION: 1. Stable left lung postop changes. No new nodule, mass or adenopathy. 2. Persistent type 2 endoleak related to patent IMA, with increase in size of aneurysm sac diameter from 5.1 to 5.4 cm. 3. Stable small left pleural and pericardial effusions.   Electronically Signed   By: Lucrezia Europe M.D.   On: 12/08/2014 09:41   ASSESSMENT/PLAN: This is a very pleasant 74 year old Caucasian male recently diagnosed with stage IIIA (T2a., N2, M0) non-small cell lung cancer, adenocarcinoma involving the left upper lobe and mediastinal lymphadenopathy diagnosed in August of 2014. He is status post left upper lobectomy with mediastinal lymph node dissection on 05/29/2013. He is status post 4 cycles of adjuvant chemotherapy with cisplatin and Alimta. The patient is doing fairly well today. The patient has been on observation for the last 14 months and he has no evidence for disease recurrence on the recent scan. I recommended for him to continue on observation with repeat CT scan of the chest in 6 months. He was advised to  call immediately if he has any concerning symptoms in the interval. All questions were answered. The patient knows to call the clinic with any problems, questions or concerns. We can certainly see the patient much sooner if necessary.  Disclaimer: This note was dictated with voice recognition software. Similar sounding words can inadvertently be transcribed and may not be corrected upon review.

## 2014-12-10 NOTE — Progress Notes (Signed)
Foundation One Study - patient was in to the [facility] [facility] today.  Blood was drawn for the Foundation One study.  Patient was thanked for his support of this clinical trial. Barb Charle Mclaurin 12/10/2014.11:47A

## 2014-12-12 ENCOUNTER — Encounter: Payer: Self-pay | Admitting: Internal Medicine

## 2014-12-14 ENCOUNTER — Other Ambulatory Visit: Payer: Medicare Other

## 2014-12-14 ENCOUNTER — Ambulatory Visit: Payer: Medicare Other | Admitting: Surgery

## 2014-12-18 ENCOUNTER — Encounter: Payer: Self-pay | Admitting: Surgery

## 2014-12-21 ENCOUNTER — Ambulatory Visit (INDEPENDENT_AMBULATORY_CARE_PROVIDER_SITE_OTHER): Payer: Medicare Other | Admitting: Surgery

## 2014-12-21 ENCOUNTER — Encounter: Payer: Self-pay | Admitting: Surgery

## 2014-12-21 VITALS — BP 152/89 | HR 73 | Resp 16 | Ht 64.5 in | Wt 156.0 lb

## 2014-12-21 DIAGNOSIS — Z48812 Encounter for surgical aftercare following surgery on the circulatory system: Secondary | ICD-10-CM | POA: Diagnosis not present

## 2014-12-21 DIAGNOSIS — I714 Abdominal aortic aneurysm, without rupture, unspecified: Secondary | ICD-10-CM

## 2014-12-21 DIAGNOSIS — I716 Thoracoabdominal aortic aneurysm, without rupture, unspecified: Secondary | ICD-10-CM

## 2014-12-21 NOTE — Progress Notes (Signed)
Patient name: Jordan Mccullough MRN: [id number] DOB: [birth year] Sex: male     Chief Complaint  Patient presents with  . Re-evaluation    6 mo  AAA  f/u With CT scan Abd/Pelsvis  12-08-14  S/P  EVAR  05-07-14    HISTORY OF PRESENT ILLNESS: The patient is back for follow-up.  He is status post endovascular aneurysm repair performed on 05/07/2014.  At his first postoperative visit, a type II endoleak was identified on CT scan.  However, the aneurysm had decreased in size from 5.1 x 5.4 down to 5.1 x 5.0.  He comes in today without complaints.  Past Medical History  Diagnosis Date  . Hyperlipidemia   . Diverticulosis   . Coronary artery disease     MI 1992, S/P  PTCA; negative stress test in November 2011 with no ischemia.   . Peripheral vascular disease 1/14    4.8x4.6 cm infrarenal abdominal aortic fusiform aneurysm, 1.5 cm right common iliac artery aneurysm  . AAA (abdominal aortic aneurysm)   . Hearing loss     Bilateral   . Myocardial infarction 1992    Dr Angelena Form    . History of radiation therapy 11/10/13-12/12/13    lung,50Gy/71f  . Lung cancer 10-14    non-small cell lung cancer  . Hypertension   . Shortness of breath     Past Surgical History  Procedure Laterality Date  . Ptca  1992  . Inguinal heriiorrhaphy bilaterally    . Colonoscopy w/ polypectomy  2003     negative 2010,due 2020; Dr BOlevia Perches . Pilonidal cyst excision    . Rotator cuff repair      Bilateral  . Cystoscopy/retrograde/ureteroscopy Bilateral 10/09/2012    Procedure: BILATERAL RETROGRADE bladder and urethral BIOPSY ;  Surgeon: DMolli Hazard MD;  Location: [facility];  Service: Urology;  Laterality: Bilateral;  BILATERAL RETROGRADE  AND bladder and urethral BIOPSY    . Prostate biopsy N/A 10/09/2012    Procedure: PROSTATIC URETHRAL BIOPSY;  Surgeon: DMolli Hazard MD;  Location: [facility];  Service: Urology;  Laterality: N/A;  PROSTATIC URETHRAL BIOPSY    . Hernia repair Bilateral    Inguinal  . Mediastinoscopy N/A 05/01/2013    Procedure: MEDIASTINOSCOPY;  Surgeon: BGaye Pollack MD;  [facility];  Service: Thoracic;  Laterality: N/A;  . Video bronchoscopy N/A 05/01/2013    Procedure: VIDEO BRONCHOSCOPY;  Surgeon: BGaye Pollack MD;  [facility]  Service: Thoracic;  Laterality: N/A;  . Coronary angioplasty      no stents  . Thorocotomy with lobectomy Left 05/29/2013    Procedure: LEFT THOROCOTOMY WITH LEFT UPPER LOBE LOBECTOMY;  Surgeon: BGaye Pollack MD;  [facility];  Service: Thoracic;  Laterality: Left;  . Abdominal aortic endovascular stent graft N/A 05/07/2014    Procedure: ABDOMINAL AORTIC ENDOVASCULAR STENT GRAFT;  Surgeon: VSerafina Mitchell MD;  [facility];  Service: Vascular;  Laterality: N/A;    History   Social History  . Marital Status: Married    Spouse Name: N/A  . Number of Children: N/A  . Years of Education: N/A   Occupational History  . Retired tRisk analyst   Social History Main Topics  . Smoking status: Former Smoker -- 1.00 packs/day for 24 years    Types: Cigarettes, Cigars    Quit date: 05/28/2013  . Smokeless tobacco: Former [employer] developer   Quit date: 05/21/2013     Comment: QUIT  15 YEARS AGO  . Alcohol Use: 9.0 oz/week    3 Glasses of wine, 12 Cans of beer per week     Comment: daily  . Drug Use: No  . Sexual Activity: Not on file   Other Topics Concern  . Not on file   Social History Narrative   HEART HEALTHY DIET   RETIRED   MARRIED   FORMER SMOKER QUIT 1992   ALCOHOL USE -YES- RED WINE SOCIALLY   SMOKING -QUIT    Family History  Problem Relation Age of Onset  . Hypertension Mother   . Prostate cancer Father     Prostate cancer  . Cancer Father     Brain Tumor  . Lung cancer Sister     NON SMOKER  . Cancer Sister   . Hyperlipidemia Sister   . Diabetes Neg Hx   . Stroke Neg Hx   . Hyperlipidemia Brother   . Heart attack Brother   . Hypertension Brother     Allergies as of 12/21/2014 -  Review Complete 12/21/2014  Allergen Reaction Noted  . Iodinated diagnostic agents Hives 07/21/2014    Current Outpatient Prescriptions on File Prior to Visit  Medication Sig Dispense Refill  . amLODipine (NORVASC) 5 MG tablet Take 1 tablet (5 mg total) by mouth daily. 30 tablet 5  . aspirin EC 81 MG tablet Take 81 mg by mouth daily.    . CRESTOR 40 MG tablet TAKE 1 TABLET (40 MG TOTAL) BY MOUTH DAILY. 30 tablet 5  . Glucosamine-Chondroit-Vit C-Mn (GLUCOSAMINE 1500 COMPLEX PO) Take 1,500 mg by mouth daily.     . metoprolol (LOPRESSOR) 50 MG tablet Take 1 tablet (50 mg total) by mouth 2 (two) times daily. 180 tablet 1  . Multiple Vitamin (MULTIVITAMIN) tablet Take 1 tablet by mouth every evening.      No current facility-administered medications on file prior to visit.     REVIEW OF SYSTEMS: Cardiovascular: No chest pain, chest pressure, palpitations, orthopnea, or dyspnea on exertion. No claudication or rest pain,  No history of DVT or phlebitis. Pulmonary: No productive cough, asthma or wheezing. Neurologic: No weakness, paresthesias, aphasia, or amaurosis. No dizziness. Hematologic: No bleeding problems or clotting disorders. Musculoskeletal: No joint pain or joint swelling. Gastrointestinal: No blood in stool or hematemesis Genitourinary: No dysuria or hematuria. Psychiatric:: No history of major depression. Integumentary: No rashes or ulcers. Constitutional: No fever or chills.  PHYSICAL EXAMINATION:   Vital signs are  Filed Vitals:   12/21/14 1236 12/21/14 1241  BP: 150/86 152/89  Pulse: 72 73  Resp: 16   Height: 5' 4.5" (1.638 m)   Weight: 156 lb (70.761 kg)   SpO2: 98%    Body mass index is 26.37 kg/(m^2). General: The patient appears their stated age. HEENT:  No gross abnormalities Pulmonary:  Non labored breathing Abdomen: Soft and non-tender.no pulsatile mass Musculoskeletal: There are no major deformities. Neurologic: No focal weakness or paresthesias are  detected, Skin: There are no ulcer or rashes noted. Psychiatric: The patient has normal affect. Cardiovascular: There is a regular rate and rhythm without significant murmur appreciated.   Diagnostic Studies CT scan has been reviewed which shows a persistent type II endoleak.  His aneurysm has increased in size from 5.1x 5.02 today of 5.2 x 5.4  Assessment: Status post endovascular aneurysm repair Plan: I discussed the CT scan findings of a type II endoleak with the patient.  I would not recommend any intervention at this time.  There  is been a nominal increase in size of the aneurysm sac.  I will repeat his CT scan in October to coordinate with Dr. Inda Merlin CT scan.  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  Eldridge Abrahams, M.D. [facility] Office: [phone] Pager:  [phone]

## 2014-12-21 NOTE — Progress Notes (Signed)
Filed Vitals:   12/21/14 1236 12/21/14 1241  BP: 150/86 152/89  Pulse: 72 73  Resp: 16   Height: 5' 4.5" (1.638 m)   Weight: 156 lb (70.761 kg)   SpO2: 98%

## 2014-12-22 NOTE — Addendum Note (Signed)
Addended by: Mena Goes on: 12/22/2014 03:03 PM   Modules accepted: Orders

## 2015-01-16 ENCOUNTER — Encounter: Payer: Self-pay | Admitting: Internal Medicine

## 2015-01-19 MED ORDER — METOPROLOL TARTRATE 50 MG PO TABS: 50.0000 mg | ORAL_TABLET | Freq: Two times a day (BID) | ORAL | Status: DC

## 2015-01-25 ENCOUNTER — Telehealth: Payer: Self-pay

## 2015-01-25 NOTE — Telephone Encounter (Signed)
LVM for pt to call back in regards to scheduling AWV with our health coach.   RE: Due for annual now.

## 2015-01-27 ENCOUNTER — Other Ambulatory Visit: Payer: Medicare Other

## 2015-02-01 ENCOUNTER — Ambulatory Visit: Payer: Medicare Other | Admitting: Internal Medicine

## 2015-03-05 ENCOUNTER — Encounter: Payer: Self-pay | Admitting: Internal Medicine

## 2015-03-07 ENCOUNTER — Encounter: Payer: Self-pay | Admitting: Internal Medicine

## 2015-03-11 ENCOUNTER — Encounter: Payer: Self-pay | Admitting: Internal Medicine

## 2015-03-12 ENCOUNTER — Other Ambulatory Visit: Payer: Self-pay

## 2015-03-12 MED ORDER — BUDESONIDE-FORMOTEROL FUMARATE 160-4.5 MCG/ACT IN AERO: 1.0000 | INHALATION_SPRAY | Freq: Two times a day (BID) | RESPIRATORY_TRACT | Status: DC

## 2015-04-10 ENCOUNTER — Other Ambulatory Visit: Payer: Self-pay | Admitting: Internal Medicine

## 2015-04-12 ENCOUNTER — Other Ambulatory Visit: Payer: Self-pay | Admitting: Emergency Medicine

## 2015-04-12 DIAGNOSIS — I10 Essential (primary) hypertension: Secondary | ICD-10-CM

## 2015-04-12 MED ORDER — AMLODIPINE BESYLATE 5 MG PO TABS: 5.0000 mg | ORAL_TABLET | Freq: Every day | ORAL | Status: DC

## 2015-05-03 ENCOUNTER — Telehealth: Payer: Self-pay | Admitting: Medical Oncology

## 2015-05-03 NOTE — Telephone Encounter (Signed)
I cancelled CT chest that was ordered by [facility]. Pt notified.

## 2015-05-03 NOTE — Telephone Encounter (Signed)
-----   Message from [name], MD sent at 04/30/2015  4:06 PM EDT ----- Regarding: RE: CT orders CT angio is ok. We can cancel the other one. ----- Message -----    From: Ardeen Garland, RN    Sent: 04/30/2015  10:29 AM      To: [name], MD Subject: CT orders                                      Dr Trula Slade ordered Ct angios for same day as you ordered CTs.  Do I need to do anything?

## 2015-05-24 ENCOUNTER — Other Ambulatory Visit: Payer: Self-pay | Admitting: Internal Medicine

## 2015-05-26 ENCOUNTER — Encounter: Payer: Self-pay | Admitting: Internal Medicine

## 2015-05-28 DIAGNOSIS — H6123 Impacted cerumen, bilateral: Secondary | ICD-10-CM | POA: Diagnosis not present

## 2015-05-28 DIAGNOSIS — H903 Sensorineural hearing loss, bilateral: Secondary | ICD-10-CM | POA: Diagnosis not present

## 2015-05-28 DIAGNOSIS — H9121 Sudden idiopathic hearing loss, right ear: Secondary | ICD-10-CM | POA: Diagnosis not present

## 2015-06-05 ENCOUNTER — Other Ambulatory Visit: Payer: Self-pay | Admitting: Internal Medicine

## 2015-06-07 ENCOUNTER — Other Ambulatory Visit: Payer: Self-pay | Admitting: Emergency Medicine

## 2015-06-07 ENCOUNTER — Ambulatory Visit (HOSPITAL_COMMUNITY)
Admission: RE | Admit: 2015-06-07 | Discharge: 2015-06-07 | Disposition: A | Discharge status: Home / Self Care | Payer: Medicare Other | Source: Ambulatory Visit | Attending: Surgery | Admitting: Surgery

## 2015-06-07 ENCOUNTER — Other Ambulatory Visit: Payer: Self-pay | Admitting: Medical Oncology

## 2015-06-07 ENCOUNTER — Other Ambulatory Visit (HOSPITAL_BASED_OUTPATIENT_CLINIC_OR_DEPARTMENT_OTHER): Payer: Medicare Other

## 2015-06-07 ENCOUNTER — Ambulatory Visit (HOSPITAL_COMMUNITY): Payer: Medicare Other

## 2015-06-07 DIAGNOSIS — R918 Other nonspecific abnormal finding of lung field: Secondary | ICD-10-CM | POA: Insufficient documentation

## 2015-06-07 DIAGNOSIS — I714 Abdominal aortic aneurysm, without rupture, unspecified: Secondary | ICD-10-CM

## 2015-06-07 DIAGNOSIS — K4090 Unilateral inguinal hernia, without obstruction or gangrene, not specified as recurrent: Secondary | ICD-10-CM | POA: Diagnosis not present

## 2015-06-07 DIAGNOSIS — Z85118 Personal history of other malignant neoplasm of bronchus and lung: Secondary | ICD-10-CM

## 2015-06-07 DIAGNOSIS — N400 Enlarged prostate without lower urinary tract symptoms: Secondary | ICD-10-CM | POA: Diagnosis not present

## 2015-06-07 DIAGNOSIS — Z902 Acquired absence of lung [part of]: Secondary | ICD-10-CM | POA: Insufficient documentation

## 2015-06-07 DIAGNOSIS — I716 Thoracoabdominal aortic aneurysm, without rupture, unspecified: Secondary | ICD-10-CM

## 2015-06-07 DIAGNOSIS — C3412 Malignant neoplasm of upper lobe, left bronchus or lung: Secondary | ICD-10-CM

## 2015-06-07 DIAGNOSIS — Z48812 Encounter for surgical aftercare following surgery on the circulatory system: Secondary | ICD-10-CM

## 2015-06-07 DIAGNOSIS — K8020 Calculus of gallbladder without cholecystitis without obstruction: Secondary | ICD-10-CM | POA: Diagnosis not present

## 2015-06-07 DIAGNOSIS — C3490 Malignant neoplasm of unspecified part of unspecified bronchus or lung: Secondary | ICD-10-CM

## 2015-06-07 LAB — CBC WITH DIFFERENTIAL/PLATELET
BASO%: 0.1 % (ref 0.0–2.0)
Basophils Absolute: 0 10*3/uL (ref 0.0–0.1)
EOS%: 0.2 % (ref 0.0–7.0)
Eosinophils Absolute: 0 10*3/uL (ref 0.0–0.5)
HCT: 44 % (ref 38.4–49.9)
HGB: 15.1 g/dL (ref 13.0–17.1)
LYMPH%: 17.4 % (ref 14.0–49.0)
MCH: 29.5 pg (ref 27.2–33.4)
MCHC: 34.3 g/dL (ref 32.0–36.0)
MCV: 85.9 fL (ref 79.3–98.0)
MONO#: 0.9 10*3/uL (ref 0.1–0.9)
MONO%: 9 % (ref 0.0–14.0)
NEUT#: 7.6 10*3/uL — ABNORMAL HIGH (ref 1.5–6.5)
NEUT%: 73.3 % (ref 39.0–75.0)
Platelets: 207 10*3/uL (ref 140–400)
RBC: 5.12 10*6/uL (ref 4.20–5.82)
RDW: 15 % — ABNORMAL HIGH (ref 11.0–14.6)
WBC: 10.4 10*3/uL — ABNORMAL HIGH (ref 4.0–10.3)
lymph#: 1.8 10*3/uL (ref 0.9–3.3)

## 2015-06-07 LAB — COMPREHENSIVE METABOLIC PANEL (CC13)
ALT: 76 U/L — ABNORMAL HIGH (ref 0–55)
AST: 28 U/L (ref 5–34)
Albumin: 3.4 g/dL — ABNORMAL LOW (ref 3.5–5.0)
Alkaline Phosphatase: 59 U/L (ref 40–150)
Anion Gap: 8 mEq/L (ref 3–11)
BUN: 18.1 mg/dL (ref 7.0–26.0)
CO2: 24 mEq/L (ref 22–29)
Calcium: 9.2 mg/dL (ref 8.4–10.4)
Chloride: 107 mEq/L (ref 98–109)
Creatinine: 0.8 mg/dL (ref 0.7–1.3)
EGFR: 88 mL/min/{1.73_m2} — ABNORMAL LOW (ref >90)
Glucose: 92 mg/dl (ref 70–140)
Potassium: 3.8 mEq/L (ref 3.5–5.1)
Sodium: 140 mEq/L (ref 136–145)
Total Bilirubin: 0.35 mg/dL (ref 0.20–1.20)
Total Protein: 6.7 g/dL (ref 6.4–8.3)

## 2015-06-07 MED ORDER — IOHEXOL 350 MG/ML SOLN
100.0000 mL | Freq: Once | INTRAVENOUS | Status: AC | PRN
  Administered 2015-06-07: 100 mL via INTRAVENOUS

## 2015-06-07 MED ORDER — BUDESONIDE-FORMOTEROL FUMARATE 160-4.5 MCG/ACT IN AERO: 1.0000 | INHALATION_SPRAY | Freq: Two times a day (BID) | RESPIRATORY_TRACT | Status: DC

## 2015-06-08 ENCOUNTER — Other Ambulatory Visit: Payer: Self-pay | Admitting: Internal Medicine

## 2015-06-10 ENCOUNTER — Encounter: Payer: Self-pay | Admitting: *Deleted

## 2015-06-10 ENCOUNTER — Encounter: Payer: Self-pay | Admitting: Internal Medicine

## 2015-06-10 ENCOUNTER — Ambulatory Visit (HOSPITAL_BASED_OUTPATIENT_CLINIC_OR_DEPARTMENT_OTHER): Payer: Medicare Other | Admitting: Internal Medicine

## 2015-06-10 ENCOUNTER — Telehealth: Payer: Self-pay | Admitting: Internal Medicine

## 2015-06-10 VITALS — BP 149/87 | HR 78 | Temp 98.5°F | Resp 18 | Ht 64.5 in | Wt 150.2 lb

## 2015-06-10 DIAGNOSIS — H9121 Sudden idiopathic hearing loss, right ear: Secondary | ICD-10-CM | POA: Diagnosis not present

## 2015-06-10 DIAGNOSIS — Z85118 Personal history of other malignant neoplasm of bronchus and lung: Secondary | ICD-10-CM | POA: Diagnosis not present

## 2015-06-10 DIAGNOSIS — H903 Sensorineural hearing loss, bilateral: Secondary | ICD-10-CM | POA: Diagnosis not present

## 2015-06-10 DIAGNOSIS — C3412 Malignant neoplasm of upper lobe, left bronchus or lung: Secondary | ICD-10-CM

## 2015-06-10 DIAGNOSIS — R234 Changes in skin texture: Secondary | ICD-10-CM | POA: Diagnosis not present

## 2015-06-10 NOTE — Telephone Encounter (Signed)
per pof to sch pt appt-gave pt copy pf avs

## 2015-06-10 NOTE — Progress Notes (Signed)
Oncology Nurse Navigator Documentation  Oncology Nurse Navigator Flowsheets 06/10/2015  Navigator Encounter Type Other/per Dr. Julien Nordmann I notified [facility] office to schedule Jordan Mccullough to be seen with Dr. Cyndia Bent for possible disease progression.   Treatment Phase Abnormal Scans  Interventions Coordination of Care  Coordination of Care MD Appointments  Time Spent with Patient 15

## 2015-06-10 NOTE — Progress Notes (Signed)
[facility]  [phone] [phone] OFFICE PROGRESS NOTE  [name], MD [address] [place] [address]  DIAGNOSIS: Stage IIIA (T2a., N2, M0) non-small cell lung cancer adenocarcinoma with negative EGFR mutation and negative ALK gene translocation involving the left upper lobe middle mediastinal lymphadenopathy diagnosed in August of 2014  Lung cancer   Primary site: Lung (Left)   Staging method: AJCC 7th Edition   Clinical: Stage IIIA (T2a, N2, M0) signed by [name], MD on 06/19/2013  5:06 PM   Summary: Stage IIIA (T2a, N2, M0).  Molecular biomarkers: Positive for [id number]*7, [id number] ATM splice site [id number]>TR TP53 E271K, SETD2 [id number] and SPOP E47K                                          Negative for: RET, ALK, BRAF, KRAS, ERBB2, MET and EGFR.  PRIOR THERAPY:  1) Status post left upper lobectomy with mediastinal lymph node dissection on 05/29/2013. 2) Adjuvant chemotherapy with cisplatin at 75 mg per meter squared and Alimta at 500 mg per meter squared given every 3 weeks for a total of 4 cycles. First cycle given on 07/10/2013 3) curative radiotherapy to the mediastinum under the care of Dr. [name] on 12/12/2013.  CURRENT THERAPY: Observation.  DISEASE STAGE: Lung cancer   Primary site: Lung (Left)   Staging method: AJCC 7th Edition   Clinical: Stage IIIA (T2a, N2, M0) signed by [name] on 06/19/2013  5:06 PM   Summary: Stage IIIA (T2a, N2, M0)  CHEMOTHERAPY INTENT: Control/curative  CURRENT # OF CHEMOTHERAPY CYCLES: 0  CURRENT ANTIEMETICS: Aloxi, dexamethasone, Emend, Compazine  CURRENT SMOKING STATUS: Former smoker  ORAL CHEMOTHERAPY AND CONSENT: n/a  CURRENT BISPHOSPHONATES USE: none  PAIN MANAGEMENT: 0/10  NARCOTICS INDUCED CONSTIPATION: None  LIVING WILL AND CODE STATUS: Full code   INTERVAL HISTORY: AnDEVONTA[age]AYA89.o. male returns for 4 month followup visit  accompanied by his wife. The patient is feeling fine today with no specific complaints. Had a loss of hearing on the right ear that lasted for a few hours and resolved spontaneously. He denied having any significant nausea or vomiting, no fever or chills. The patient denied having any significant chest pain, shortness of breath, or hemoptysis. He had repeat CT scan of the chest performed recently and he is here for evaluation and discussion of his scan results.  MEDICAL HISTORY: Past Medical History  Diagnosis Date  . Hyperlipidemia   . Diverticulosis   . Coronary artery disease     MI 1992, S/P  PTCA; negative stress test in November 2011 with no ischemia.   . Peripheral vascular disease 1/14    4.8x4.6 cm infrarenal abdominal aortic fusiform aneurysm, 1.5 cm right common iliac artery aneurysm  . AAA (abdominal aortic aneurysm)   . Hearing loss     Bilateral   . Myocardial infarction 1992    Dr [name]  . History of radiation therapy 11/10/13-12/12/13    lung,50Gy/2543f. Lung cancer 10-14    non-small cell lung cancer  . Hypertension   . Shortness of breath     ALLERGIES:  is allergic to iodinated diagnostic agents.  MEDICATIONS:  Current Outpatient Prescriptions  Medication Sig Dispense Refill  . amLODipine (NORVASC) 5 MG tablet Take 1 tablet (5 mg total) by mouth daily. 30 tablet 5  .  aspirin EC 81 MG tablet Take 81 mg by mouth daily.    . budesonide-formoterol (SYMBICORT) 160-4.5 MCG/ACT inhaler Inhale 1-2 puffs into the lungs every 12 (twelve) hours. Gargle and spit after use. 1 Inhaler 2  . CRESTOR 40 MG tablet TAKE 1 TABLET (40 MG TOTAL) BY MOUTH DAILY. 30 tablet 5  . Glucosamine-Chondroit-Vit C-Mn (GLUCOSAMINE 1500 COMPLEX PO) Take 1,500 mg by mouth daily.     . metoprolol (LOPRESSOR) 50 MG tablet Take 1 tablet (50 mg total) by mouth 2 (two) times daily. 180 tablet 1  . Multiple Vitamin (MULTIVITAMIN) tablet Take 1 tablet by mouth every evening.      No current  facility-administered medications for this visit.    SURGICAL HISTORY:  Past Surgical History  Procedure Laterality Date  . Ptca  1992  . Inguinal heriiorrhaphy bilaterally    . Colonoscopy w/ polypectomy  2003     negative 2010,due 2020; Dr Olevia Perches  . Pilonidal cyst excision    . Rotator cuff repair      Bilateral  . Cystoscopy/retrograde/ureteroscopy Bilateral 10/09/2012    Procedure: BILATERAL RETROGRADE bladder and urethral BIOPSY ;  Surgeon: Molli Hazard, MD;  Location: [facility];  Service: Urology;  Laterality: Bilateral;  BILATERAL RETROGRADE  AND bladder and urethral BIOPSY    . Prostate biopsy N/A 10/09/2012    Procedure: PROSTATIC URETHRAL BIOPSY;  Surgeon: Molli Hazard, MD;  Location: [facility];  Service: Urology;  Laterality: N/A;  PROSTATIC URETHRAL BIOPSY    . Hernia repair Bilateral     Inguinal  . Mediastinoscopy N/A 05/01/2013    Procedure: MEDIASTINOSCOPY;  Surgeon: Gaye Pollack, MD;  Location: [facility] OR;  Service: Thoracic;  Laterality: N/A;  . Video bronchoscopy N/A 05/01/2013    Procedure: VIDEO BRONCHOSCOPY;  Surgeon: Gaye Pollack, MD;  Location: [facility] OR;  Service: Thoracic;  Laterality: N/A;  . Coronary angioplasty      no stents  . Thorocotomy with lobectomy Left 05/29/2013    Procedure: LEFT THOROCOTOMY WITH LEFT UPPER LOBE LOBECTOMY;  Surgeon: Gaye Pollack, MD;  Location: [facility];  Service: Thoracic;  Laterality: Left;  . Abdominal aortic endovascular stent graft N/A 05/07/2014    Procedure: ABDOMINAL AORTIC ENDOVASCULAR STENT GRAFT;  Surgeon: Serafina Mitchell, MD;  Location: [facility];  Service: Vascular;  Laterality: N/A;    REVIEW OF SYSTEMS:  A comprehensive review of systems was negative.   PHYSICAL EXAMINATION: General appearance: alert, cooperative, appears stated age and no distress Head: Normocephalic, without obvious abnormality, atraumatic Neck: no adenopathy, no carotid bruit, no JVD, supple, symmetrical, trachea midline and thyroid not  enlarged, symmetric, no tenderness/mass/nodules Lymph nodes: Cervical, supraclavicular, and axillary nodes normal. Resp: clear to auscultation bilaterally Cardio: S1, S2 normal and Occasional missed/skipped beat(s) GI: soft, non-tender; bowel sounds normal; no masses,  no organomegaly Extremities: extremities normal, atraumatic, no cyanosis or edema Neurologic: Alert and oriented X 3, normal strength and tone. Normal symmetric reflexes. Normal coordination and gait  ECOG PERFORMANCE STATUS: 1 - Symptomatic but completely ambulatory  Blood pressure 149/87, pulse 78, temperature 98.5 F (36.9 C), temperature source Oral, resp. rate 18, height 5' 4.5" (1.638 m), weight 150 lb 3.2 oz (68.13 kg), SpO2 98 %.  LABORATORY DATA: Lab Results  Component Value Date   WBC 10.4* 06/07/2015   HGB 15.1 06/07/2015   HCT 44.0 06/07/2015   MCV 85.9 06/07/2015   PLT 207 06/07/2015      Chemistry      Component  Value Date/Time   NA 140 06/07/2015 0906   NA 137 05/08/2014 0500   K 3.8 06/07/2015 0906   K 4.1 05/08/2014 0500   CL 100 05/08/2014 0500   CO2 24 06/07/2015 0906   CO2 24 05/08/2014 0500   BUN 18.1 06/07/2015 0906   BUN 9 05/08/2014 0500   CREATININE 0.8 06/07/2015 0906   CREATININE 0.68 05/08/2014 0500   CREATININE 0.78 04/09/2014 0953      Component Value Date/Time   CALCIUM 9.2 06/07/2015 0906   CALCIUM 8.6 05/08/2014 0500   ALKPHOS 59 06/07/2015 0906   ALKPHOS 68 05/05/2014 1055   AST 28 06/07/2015 0906   AST 27 05/05/2014 1055   ALT 76* 06/07/2015 0906   ALT 35 05/05/2014 1055   BILITOT 0.35 06/07/2015 0906   BILITOT 0.3 05/05/2014 1055       RADIOGRAPHIC STUDIES: Ct Angio Chest Aorta W/cm &/or Wo/cm  06/07/2015  CLINICAL DATA:  Abdominal aortic aneurysm status post endovascular repair complicated by type 2 endoleak EXAM: CT ANGIOGRAPHY CHEST, ABDOMEN AND PELVIS TECHNIQUE: Multidetector CT imaging through the chest, abdomen and pelvis was performed using the standard  protocol during bolus administration of intravenous contrast. Multiplanar reconstructed images and MIPs were obtained and reviewed to evaluate the vascular anatomy. CONTRAST:  174m OMNIPAQUE IOHEXOL 350 MG/ML SOLN COMPARISON:  None. Most recent prior CT abdomen/ pelvis 12/08/2014 FINDINGS: CTA CHEST FINDINGS Mediastinum: Unremarkable CT appearance of the thyroid gland. No suspicious mediastinal or hilar adenopathy. No soft tissue mediastinal mass. The thoracic esophagus is unremarkable. Heart/Vascular: 2 vessel aortic arch. The left common carotid and right brachiocephalic artery share a common origin. The thoracic aorta is within normal limits in caliber. The descending segment is tortuous. Scattered atherosclerotic vascular calcifications are present. The heart is enlarged. There is left ventricular dilatation. Calcifications are present along the course of the visualized coronary arteries. No pericardial effusion. Lungs/Pleura: Surgical changes of prior left upper lobectomy. New developing soft tissue thickening in the left hilar region adjacent to the prior surgical resection bed. The soft tissue thickening tracks in a peribronchovascular fashion into the central aspect of the left lung. Given the a amorphous shape, precise measurements are difficult. The mass measures approximately 2.6 x 2.7 cm. This is a new finding compared to prior imaging from December 08, 2014 and July 21, 2014. No additional pulmonary nodules or masses. Trace dependent atelectasis in the right lower lobe. Bones/Soft Tissues: No acute fracture or aggressive appearing lytic or blastic osseous lesion. Review of the MIP images confirms the above findings. CTA ABDOMEN AND PELVIS FINDINGS VASCULAR Aorta: Infrarenal abdominal aortic aneurysm status post endovascular repair with a bifurcated endoprosthesis extending from just below the renal arteries into the bilateral common iliac arteries. There is a persistent type 2 endoleak which appears to  emanate from the origin of the inferior mesenteric artery. The outflow may be a low-lying accessory renal artery to the right lower pole. The excluded aneurysm sac remains stable by my measurements at 5.3 x 5.1 cm (confirmed on the coronal and sagittal reformatted images). Celiac: Widely patent and unremarkable. Conventional hepatic arterial anatomy. No evidence of visceral artery aneurysm. SMA: Widely patent and unremarkable. Renals: Solitary dominant left renal artery. No significant stenosis. Dominant right main renal artery with mild atherosclerotic plaque but no significant stenosis. There is a small accessory renal artery arising from the distal abdominal aorta and supplying the lower pole of the right kidney. This remains patent. IMA: The IMA remains patent. There is a  visible communication between the SMA and IMA via an arc of [place]. Inflow: Bilateral common iliac limbs remain patent. Mild ectasia of the internal, external and common iliac arteries without true aneurysm. No evidence of dissection or other complicating feature. Proximal Outflow: Widely patent bilaterally. Veins: No focal venous abnormality. NON-VASCULAR Abdomen: Unremarkable CT appearance of the stomach, duodenum, spleen, adrenal glands and pancreas. Normal hepatic contour and morphology. 9 mm enhancing lesion in the dome of the right liver demonstrates peripheral incomplete nodular enhancement most consistent with a small hemangioma. Otherwise, no discrete lesion. No intra or extrahepatic biliary ductal dilatation. Stones are present within the gallbladder fundus. Circumscribed water attenuation cystic lesions noted in both kidneys, larger on the left than the right. No enhancing renal mass. Mild atrophy with multifocal renal cortical scarring in the lower pole of the right kidney supplied by the accessory right renal artery. Colonic diverticular disease without CT evidence of active inflammation. No evidence of obstruction or focal bowel  wall thickening. Normal appendix in the right lower quadrant. The terminal ileum is unremarkable. Pelvis: Marked prostatomegaly. The prostate gland measures at least 5.7 x 4.9 cm in demonstrates patchy internal enhancement. The bladder base is indented in the bladder wall appears diffusely mildly thickened and trabeculated. No free fluid or suspicious adenopathy. Bones/Soft Tissues: No acute fracture or aggressive appearing lytic or blastic osseous lesion. Small fat containing left inguinal hernia. Review of the MIP images confirms the above findings. IMPRESSION: VASCULAR 1. Infrarenal abdominal aortic aneurysm status post endovascular aortic repair complicated by a type 2 endoleak which appears to emanate from the inferior mesenteric artery with a possible outflow into a small accessory right lower pole renal artery. The excluded aneurysm sac remains unchanged at at 5.3 x 5.1 cm compared to 12/08/2014. Continued attention on follow-up imaging to assess for growth of the aneurysm sac over time. NON VASCULAR 1. Developing soft tissue density thickening in the left hilum along the prior left upper lobectomy resection site. Findings are concerning for local recurrence. Consider either further evaluation with bronchoscopy and possible ultrasound-guided endobronchial biopsy versus PET-CT. 2. Prostatomegaly with heterogeneous internal enhancement. 3. Diffuse mild bladder wall thickening may represent trabeculation in the setting of developing bladder outlet obstruction due to prostatomegaly. 4. Cholelithiasis. 5. Small fat containing left inguinal hernia. 6. Additional ancillary findings as above without significant interval change. These results were called by telephone at the time of interpretation on 06/07/2015 at 11:52 am to Dr. Julien Nordmann, who verbally acknowledged these results. Signed, Criselda Peaches, MD [facility] Radiology Electronically Signed   By: Jacqulynn Cadet M.D.   On: 06/07/2015 11:52   Ct Angio Abd/pel W/ And/or W/o  06/07/2015  CLINICAL DATA:  Abdominal aortic aneurysm status post endovascular repair complicated by type 2 endoleak EXAM: CT ANGIOGRAPHY CHEST, ABDOMEN AND PELVIS TECHNIQUE: Multidetector CT imaging through the chest, abdomen and pelvis was performed using the standard protocol during bolus administration of intravenous contrast. Multiplanar reconstructed images and MIPs were obtained and reviewed to evaluate the vascular anatomy. CONTRAST:  142m OMNIPAQUE IOHEXOL 350 MG/ML SOLN COMPARISON:  None. Most recent prior CT abdomen/ pelvis 12/08/2014 FINDINGS: CTA CHEST FINDINGS Mediastinum: Unremarkable CT appearance of the thyroid gland. No suspicious mediastinal or hilar adenopathy. No soft tissue mediastinal mass. The thoracic esophagus is unremarkable. Heart/Vascular: 2 vessel aortic arch. The left common carotid and right brachiocephalic artery share a common origin. The thoracic aorta is within normal limits in caliber. The descending segment is tortuous.  Scattered atherosclerotic vascular calcifications are present. The heart is enlarged. There is left ventricular dilatation. Calcifications are present along the course of the visualized coronary arteries. No pericardial effusion. Lungs/Pleura: Surgical changes of prior left upper lobectomy. New developing soft tissue thickening in the left hilar region adjacent to the prior surgical resection bed. The soft tissue thickening tracks in a peribronchovascular fashion into the central aspect of the left lung. Given the a amorphous shape, precise measurements are difficult. The mass measures approximately 2.6 x 2.7 cm. This is a new finding compared to prior imaging from December 08, 2014 and July 21, 2014. No additional pulmonary nodules or masses. Trace dependent atelectasis in the right lower lobe. Bones/Soft Tissues: No acute fracture or aggressive appearing lytic or blastic osseous  lesion. Review of the MIP images confirms the above findings. CTA ABDOMEN AND PELVIS FINDINGS VASCULAR Aorta: Infrarenal abdominal aortic aneurysm status post endovascular repair with a bifurcated endoprosthesis extending from just below the renal arteries into the bilateral common iliac arteries. There is a persistent type 2 endoleak which appears to emanate from the origin of the inferior mesenteric artery. The outflow may be a low-lying accessory renal artery to the right lower pole. The excluded aneurysm sac remains stable by my measurements at 5.3 x 5.1 cm (confirmed on the coronal and sagittal reformatted images). Celiac: Widely patent and unremarkable. Conventional hepatic arterial anatomy. No evidence of visceral artery aneurysm. SMA: Widely patent and unremarkable. Renals: Solitary dominant left renal artery. No significant stenosis. Dominant right main renal artery with mild atherosclerotic plaque but no significant stenosis. There is a small accessory renal artery arising from the distal abdominal aorta and supplying the lower pole of the right kidney. This remains patent. IMA: The IMA remains patent. There is a visible communication between the SMA and IMA via an arc of Riolan. Inflow: Bilateral common iliac limbs remain patent. Mild ectasia of the internal, external and common iliac arteries without true aneurysm. No evidence of dissection or other complicating feature. Proximal Outflow: Widely patent bilaterally. Veins: No focal venous abnormality. NON-VASCULAR Abdomen: Unremarkable CT appearance of the stomach, duodenum, spleen, adrenal glands and pancreas. Normal hepatic contour and morphology. 9 mm enhancing lesion in the dome of the right liver demonstrates peripheral incomplete nodular enhancement most consistent with a small hemangioma. Otherwise, no discrete lesion. No intra or extrahepatic biliary ductal dilatation. Stones are present within the gallbladder fundus. Circumscribed water  attenuation cystic lesions noted in both kidneys, larger on the left than the right. No enhancing renal mass. Mild atrophy with multifocal renal cortical scarring in the lower pole of the right kidney supplied by the accessory right renal artery. Colonic diverticular disease without CT evidence of active inflammation. No evidence of obstruction or focal bowel wall thickening. Normal appendix in the right lower quadrant. The terminal ileum is unremarkable. Pelvis: Marked prostatomegaly. The prostate gland measures at least 5.7 x 4.9 cm in demonstrates patchy internal enhancement. The bladder base is indented in the bladder wall appears diffusely mildly thickened and trabeculated. No free fluid or suspicious adenopathy. Bones/Soft Tissues: No acute fracture or aggressive appearing lytic or blastic osseous lesion. Small fat containing left inguinal hernia. Review of the MIP images confirms the above findings. IMPRESSION: VASCULAR 1. Infrarenal abdominal aortic aneurysm status post endovascular aortic repair complicated by a type 2 endoleak which appears to emanate from the inferior mesenteric artery with a possible outflow into a small accessory right lower pole renal artery. The excluded aneurysm sac remains unchanged at  at 5.3 x 5.1 cm compared to 12/08/2014. Continued attention on follow-up imaging to assess for growth of the aneurysm sac over time. NON VASCULAR 1. Developing soft tissue density thickening in the left hilum along the prior left upper lobectomy resection site. Findings are concerning for local recurrence. Consider either further evaluation with bronchoscopy and possible ultrasound-guided endobronchial biopsy versus PET-CT. 2. Prostatomegaly with heterogeneous internal enhancement. 3. Diffuse mild bladder wall thickening may represent trabeculation in the setting of developing bladder outlet obstruction due to prostatomegaly. 4. Cholelithiasis. 5. Small fat containing left inguinal hernia. 6.  Additional ancillary findings as above without significant interval change. These results were called by telephone at the time of interpretation on 06/07/2015 at 11:52 am to Dr. Julien Nordmann, who verbally acknowledged these results. Signed, Criselda Peaches, MD [facility] Radiology Electronically Signed   By: Jacqulynn Cadet M.D.   On: 06/07/2015 11:52   ASSESSMENT/PLAN: This is a very pleasant 74 year old Caucasian male recently diagnosed with stage IIIA (T2a., N2, M0) non-small cell lung cancer, adenocarcinoma involving the left upper lobe and mediastinal lymphadenopathy diagnosed in August of 2014. He is status post left upper lobectomy with mediastinal lymph node dissection on 05/29/2013. He is status post 4 cycles of adjuvant chemotherapy with cisplatin and Alimta. The patient is doing fairly well today. The recent CT scan of the chest showed development of soft tissue density thickening in the left hilum along the prior left upper lobectomy resection site concerning for local recurrence. I discussed the scan results and showed the images to the patient and his wife. I recommended for him to see Dr. Cyndia Bent for reevaluation and consideration of bronchoscopy to rule out any disease recurrence in this area. I will see the patient back for follow-up visit in one month for reevaluation and discussion of his treatment options based on the bronchoscopy results. He will need a PET scan as well as MRI of the brain if there is evidence for disease recurrence. The patient his wife agreed to the current plan. He was advised to call immediately if he has any concerning symptoms in the interval. All questions were answered. The patient knows to call the clinic with any problems, questions or concerns. We can certainly see the patient much sooner if necessary.  Disclaimer: This note was dictated with voice recognition software. Similar sounding words can  inadvertently be transcribed and may not be corrected upon review.

## 2015-06-16 ENCOUNTER — Encounter: Payer: Self-pay | Admitting: Surgery

## 2015-06-21 ENCOUNTER — Ambulatory Visit (INDEPENDENT_AMBULATORY_CARE_PROVIDER_SITE_OTHER): Payer: Medicare Other | Admitting: Surgery

## 2015-06-21 ENCOUNTER — Encounter: Payer: Self-pay | Admitting: Surgery

## 2015-06-21 VITALS — BP 140/93 | HR 77 | Temp 97.8°F | Resp 20 | Ht 64.5 in | Wt 150.0 lb

## 2015-06-21 DIAGNOSIS — I714 Abdominal aortic aneurysm, without rupture, unspecified: Secondary | ICD-10-CM

## 2015-06-21 DIAGNOSIS — Z48812 Encounter for surgical aftercare following surgery on the circulatory system: Secondary | ICD-10-CM

## 2015-06-21 NOTE — Progress Notes (Signed)
Filed Vitals:   06/21/15 1241 06/21/15 1245  BP: 153/89 140/93  Pulse: 77   Temp: 97.8 F (36.6 C)   TempSrc: Oral   Resp: 20   Height: 5' 4.5" (1.638 m)   Weight: 150 lb (68.04 kg)

## 2015-06-21 NOTE — Progress Notes (Signed)
Patient name: Jordan Mccullough MRN: [id number] DOB: September 24, 1940 Sex: male     Chief Complaint  Patient presents with  . AAA    hx of EVAR; 6 mo. f/u surveillance    HISTORY OF PRESENT ILLNESS: The patient is back for follow-up.  He is status post endovascular aneurysm repair on 05/07/2014.  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  His aneurysm sac has remained relatively stable.  He denies any abdominal pain.  Past Medical History  Diagnosis Date  . Hyperlipidemia   . Diverticulosis   . Coronary artery disease     MI 1992, S/P  PTCA; negative stress test in November 2011 with no ischemia.   . Peripheral vascular disease ([place]) 1/14    4.8x4.6 cm infrarenal abdominal aortic fusiform aneurysm, 1.5 cm right common iliac artery aneurysm  . AAA (abdominal aortic aneurysm) ([place])   . Hearing loss     Bilateral   . Myocardial infarction [facility]) 1992    Dr Angelena Form    . History of radiation therapy 11/10/13-12/12/13    lung,50Gy/49f  . Lung cancer ([place] 10-14    non-small cell lung cancer  . Hypertension   . Shortness of breath     Past Surgical History  Procedure Laterality Date  . Ptca  1992  . Inguinal heriiorrhaphy bilaterally    . Colonoscopy w/ polypectomy  2003     negative 2010,due 2020; Dr BOlevia Perches . Pilonidal cyst excision    . Rotator cuff repair      Bilateral  . Cystoscopy/retrograde/ureteroscopy Bilateral 10/09/2012    Procedure: BILATERAL RETROGRADE bladder and urethral BIOPSY ;  Surgeon: DMolli Hazard MD;  [facility]: [facility];  Service: Urology;  Laterality: Bilateral;  BILATERAL RETROGRADE  AND bladder and urethral BIOPSY    . Prostate biopsy N/A 10/09/2012    Procedure: PROSTATIC URETHRAL BIOPSY;  Surgeon: DMolli Hazard MD;  [facility];  Service: Urology;  Laterality: N/A;  PROSTATIC URETHRAL BIOPSY    . Hernia repair Bilateral     Inguinal  . Mediastinoscopy N/A 05/01/2013    Procedure:  MEDIASTINOSCOPY;  Surgeon: BGaye Pollack MD;  [facility]  Service: Thoracic;  Laterality: N/A;  . Video bronchoscopy N/A 05/01/2013    Procedure: VIDEO BRONCHOSCOPY;  Surgeon: BGaye Pollack MD;  [facility];  Service: Thoracic;  Laterality: N/A;  . Coronary angioplasty      no stents  . Thorocotomy with lobectomy Left 05/29/2013    Procedure: LEFT THOROCOTOMY WITH LEFT UPPER LOBE LOBECTOMY;  Surgeon: BGaye Pollack MD;  [facility];  Service: Thoracic;  Laterality: Left;  . Abdominal aortic endovascular stent graft N/A 05/07/2014    Procedure: ABDOMINAL AORTIC ENDOVASCULAR STENT GRAFT;  Surgeon: VSerafina Mitchell MD;  [facility]  Service: Vascular;  Laterality: N/A;    Social History   Social History  . Marital Status: Married    Spouse Name: N/A  . Number of Children: N/A  . Years of Education: N/A   Occupational History  . Retired tRisk analyst   Social History Main Topics  . Smoking status: Former Smoker -- 1.00 packs/day for 24 years    Types: Cigarettes, Cigars    Quit date: 05/28/2013  . Smokeless tobacco: Former USystems developer   Quit date: 05/21/2013     Comment: QUIT 15 YEARS AGO  . Alcohol Use: 9.0 oz/week    3 Glasses of wine,  12 Cans of beer per week     Comment: daily  . Drug Use: No  . Sexual Activity: Not on file   Other Topics Concern  . Not on file   Social History Narrative   HEART HEALTHY DIET   RETIRED   MARRIED   FORMER SMOKER QUIT 1992   ALCOHOL USE -YES- RED WINE SOCIALLY   SMOKING -QUIT    Family History  Problem Relation Age of Onset  . Hypertension Mother   . Prostate cancer Father     Prostate cancer  . Cancer Father     Brain Tumor  . Lung cancer Sister     NON SMOKER  . Cancer Sister   . Hyperlipidemia Sister   . Diabetes Neg Hx   . Stroke Neg Hx   . Hyperlipidemia Brother   . Heart attack Brother   . Hypertension Brother     Allergies as of 06/21/2015 - Review Complete 06/21/2015  Allergen Reaction Noted    . Iodinated diagnostic agents Hives 07/21/2014    Current Outpatient Prescriptions on File Prior to Visit  Medication Sig Dispense Refill  . amLODipine (NORVASC) 5 MG tablet Take 1 tablet (5 mg total) by mouth daily. 30 tablet 5  . aspirin EC 81 MG tablet Take 81 mg by mouth daily.    . budesonide-formoterol (SYMBICORT) 160-4.5 MCG/ACT inhaler Inhale 1-2 puffs into the lungs every 12 (twelve) hours. Gargle and spit after use. 1 Inhaler 2  . CRESTOR 40 MG tablet TAKE 1 TABLET (40 MG TOTAL) BY MOUTH DAILY. 30 tablet 5  . Glucosamine-Chondroit-Vit C-Mn (GLUCOSAMINE 1500 COMPLEX PO) Take 1,500 mg by mouth daily.     . metoprolol (LOPRESSOR) 50 MG tablet Take 1 tablet (50 mg total) by mouth 2 (two) times daily. 180 tablet 1  . Multiple Vitamin (MULTIVITAMIN) tablet Take 1 tablet by mouth every evening.      No current facility-administered medications on file prior to visit.     REVIEW OF SYSTEMS: Cardiovascular: No chest pain,  Neurologic: No weakness, paresthesias, aphasia, or amaurosis. No dizziness. Hematologic: No bleeding problems or clotting disorders. Musculoskeletal: No joint pain or joint swelling. Gastrointestinal: No blood in stool or hematemesis Genitourinary: No dysuria or hematuria. Psychiatric:: No history of major depression. Integumentary: No rashes or ulcers. Constitutional: No fever or chills.  PHYSICAL EXAMINATION:   Vital signs are  Filed Vitals:   06/21/15 1241 06/21/15 1245  BP: 153/89 140/93  Pulse: 77   Temp: 97.8 F (36.6 C)   TempSrc: Oral   Resp: 20   Height: 5' 4.5" (1.638 m)   Weight: 150 lb (68.04 kg)    Body mass index is 25.36 kg/(m^2). General: The patient appears their stated age. HEENT:  No gross abnormalities Pulmonary:  Non labored breathing Abdomen: Soft and non-tender.  No pulsatile mass Musculoskeletal: There are no major deformities. Neurologic: No focal weakness or paresthesias are detected, Skin: There are no ulcer or rashes  noted. Psychiatric: The patient has normal affect. Cardiovascular: There is a regular rate and rhythm without significant murmur appreciated.   Diagnostic Studies I have reviewed his CT scan with the following findings:   1. Infrarenal abdominal aortic aneurysm status post endovascular aortic repair complicated by a type 2 endoleak which appears to emanate from the inferior mesenteric artery with a possible outflow into a small accessory right lower pole renal artery. The excluded aneurysm sac remains unchanged at at 5.3 x 5.1 cm compared to 12/08/2014.  Assessment: Status  post endovascular aneurysm repair Plan:  there has been essentially no change in the size of his aneurysm sac.  He has a persistent type II endoleak.  I discussed continued surveillance.  As long as there is no change in the size was aneurysm sac, I would not recommend intervention.  His next study will be in 6 months.  I will make sure to coordinate this with Dr. Sherle Poe. Leia Alf, M.D. [facility] [place] Office: [phone] Pager:  [phone]

## 2015-06-21 NOTE — Addendum Note (Signed)
Addended by: Dorthula Rue L on: 06/21/2015 04:56 PM   Modules accepted: Orders

## 2015-06-23 ENCOUNTER — Other Ambulatory Visit: Payer: Self-pay | Admitting: *Deleted

## 2015-06-23 ENCOUNTER — Encounter: Payer: Self-pay | Admitting: Surgery

## 2015-06-23 ENCOUNTER — Ambulatory Visit (INDEPENDENT_AMBULATORY_CARE_PROVIDER_SITE_OTHER): Payer: Medicare Other | Admitting: Surgery

## 2015-06-23 VITALS — BP 141/92 | HR 80 | Resp 16 | Ht 64.0 in | Wt 150.0 lb

## 2015-06-23 DIAGNOSIS — R918 Other nonspecific abnormal finding of lung field: Secondary | ICD-10-CM

## 2015-06-23 DIAGNOSIS — C3412 Malignant neoplasm of upper lobe, left bronchus or lung: Secondary | ICD-10-CM | POA: Diagnosis not present

## 2015-06-23 DIAGNOSIS — Z902 Acquired absence of lung [part of]: Secondary | ICD-10-CM

## 2015-06-23 DIAGNOSIS — Z85118 Personal history of other malignant neoplasm of bronchus and lung: Secondary | ICD-10-CM

## 2015-06-23 NOTE — Progress Notes (Signed)
Cardiothoracic Surgery Consultation   PCP is [name], MD Referring Provider is [name], MD  Chief Complaint  Patient presents with  . Follow-up    eval for bronch,etc...Marland KitchenCTA CHEST 06/07/15    HPI:  The patient is a 74 year old long time heavy smoker who underwent left upper lobectomy and mediastinal lymph node dissection by me on 05/29/2013 by me. There were enlarged mediastinal and hilar lymph nodes with the largest being a right paratracheal node measuring 10 mm. A PET scan showed the nodule to have an SUV of 2.4 suspicious for bronchogenic carcinoma. There is a 8 mm AP window lymph node with a SUV of 5.0. There is focal hypermetabolism in the left hilum with an SUV of 4.3 that is concerning for lymph node mets. He underwent mediastinoscopy and the mediastinal lymph nodes were negative.The final pathology after right upper lobectomy showed stage IIIA (T2a, N2, M0) adenocarcinoma with negative EGFR and ALK. He was treated with adjuvant chemo with cisplatin and Alimta for 4 cycles as well as curative radiotherapy to the mediastinum by Dr. Lisbeth Renshaw. He has been followed by Dr. Julien Nordmann and a recent CT of the chest showed some soft tissue thickening in the left hilum that was new since a CT in April. He says that he feels fine and has no complaints. He returned to smoking a few months ago and is smoking 2 ppd.   Past Medical History  Diagnosis Date  . Hyperlipidemia   . Diverticulosis   . Coronary artery disease     MI 1992, S/P  PTCA; negative stress test in November 2011 with no ischemia.   . Peripheral vascular disease ([place]) 1/14    4.8x4.6 cm infrarenal abdominal aortic fusiform aneurysm, 1.5 cm right common iliac artery aneurysm  . AAA (abdominal aortic aneurysm) ([place])   . Hearing loss     Bilateral   . Myocardial infarction [facility]) 1992    Dr Angelena Form    . History of radiation therapy 11/10/13-12/12/13    lung,50Gy/79f  . Lung cancer ([place] 10-14    non-small cell lung  cancer  . Hypertension   . Shortness of breath     Past Surgical History  Procedure Laterality Date  . Ptca  1992  . Inguinal heriiorrhaphy bilaterally    . Colonoscopy w/ polypectomy  2003     negative 2010,due 2020; Dr BOlevia Perches . Pilonidal cyst excision    . Rotator cuff repair      Bilateral  . Cystoscopy/retrograde/ureteroscopy Bilateral 10/09/2012    Procedure: BILATERAL RETROGRADE bladder and urethral BIOPSY ;  Surgeon: DMolli Hazard MD;  [facility];  Service: Urology;  Laterality: Bilateral;  BILATERAL RETROGRADE  AND bladder and urethral BIOPSY    . Prostate biopsy N/A 10/09/2012    Procedure: PROSTATIC URETHRAL BIOPSY;  Surgeon: DMolli Hazard MD;  [facility];  Service: Urology;  Laterality: N/A;  PROSTATIC URETHRAL BIOPSY    . Hernia repair Bilateral     Inguinal  . Mediastinoscopy N/A 05/01/2013    Procedure: MEDIASTINOSCOPY;  Surgeon: BGaye Pollack MD;  [facility];  Service: Thoracic;  Laterality: N/A;  . Video bronchoscopy N/A 05/01/2013    Procedure: VIDEO BRONCHOSCOPY;  Surgeon: BGaye Pollack MD;  [facility];  Service: Thoracic;  Laterality: N/A;  . Coronary angioplasty      no stents  . Thorocotomy with lobectomy Left 05/29/2013    Procedure: LEFT THOROCOTOMY WITH LEFT UPPER LOBE LOBECTOMY;  Surgeon: Gaye Pollack, MD;  Location: [place];  Service: Thoracic;  Laterality: Left;  . Abdominal aortic endovascular stent graft N/A 05/07/2014    Procedure: ABDOMINAL AORTIC ENDOVASCULAR STENT GRAFT;  Surgeon: Serafina Mitchell, MD;  [facility];  Service: Vascular;  Laterality: N/A;    Family History  Problem Relation Age of Onset  . Hypertension Mother   . Prostate cancer Father     Prostate cancer  . Cancer Father     Brain Tumor  . Lung cancer Sister     NON SMOKER  . Cancer Sister   . Hyperlipidemia Sister   . Diabetes Neg Hx   . Stroke Neg Hx   . Hyperlipidemia Brother   . Heart attack Brother   . Hypertension  Brother     Social History Social History  Substance Use Topics  . Smoking status: Former Smoker -- 1.00 packs/day for 24 years    Types: Cigarettes, Cigars    Quit date: 05/28/2013  . Smokeless tobacco: Former Systems developer    Quit date: 05/21/2013     Comment: QUIT 15 YEARS AGO  . Alcohol Use: 9.0 oz/week    3 Glasses of wine, 12 Cans of beer per week     Comment: daily    Current Outpatient Prescriptions  Medication Sig Dispense Refill  . amLODipine (NORVASC) 5 MG tablet Take 1 tablet (5 mg total) by mouth daily. 30 tablet 5  . aspirin EC 81 MG tablet Take 81 mg by mouth daily.    . budesonide-formoterol (SYMBICORT) 160-4.5 MCG/ACT inhaler Inhale 1-2 puffs into the lungs every 12 (twelve) hours. Gargle and spit after use. 1 Inhaler 2  . CRESTOR 40 MG tablet TAKE 1 TABLET (40 MG TOTAL) BY MOUTH DAILY. 30 tablet 5  . Glucosamine-Chondroit-Vit C-Mn (GLUCOSAMINE 1500 COMPLEX PO) Take 1,500 mg by mouth daily.     . metoprolol (LOPRESSOR) 50 MG tablet Take 1 tablet (50 mg total) by mouth 2 (two) times daily. 180 tablet 1  . Multiple Vitamin (MULTIVITAMIN) tablet Take 1 tablet by mouth every evening.      No current facility-administered medications for this visit.    Allergies  Allergen Reactions  . Iodinated Diagnostic Agents Hives    1 hive on lt cheek lasting approximately 1 hour on last 2 CT per pt; needs pre meds in future; 50 mg benadryl po 1 hr prior to exam per Dr. Weber Cooks    Review of Systems  Constitutional: Negative for fever, activity change, appetite change, fatigue and unexpected weight change.  HENT: Negative.   Eyes: Negative.   Respiratory: Positive for cough. Negative for chest tightness and shortness of breath.   Cardiovascular: Negative for chest pain and palpitations.  Gastrointestinal: Negative.   Endocrine: Negative.   Genitourinary: Negative.   Allergic/Immunologic: Negative.   Neurological: Negative.   Hematological: Negative.   Psychiatric/Behavioral:  Negative.     BP 141/92 mmHg  Pulse 80  Resp 16  Ht 5' 4"  (1.626 m)  Wt 150 lb (68.04 kg)  BMI 25.73 kg/m2  SpO2 97% Physical Exam  Constitutional: He is oriented to person, place, and time. He appears well-developed and well-nourished. No distress.  HENT:  Head: Normocephalic and atraumatic.  Mouth/Throat: Oropharynx is clear and moist.  Eyes: EOM are normal. Pupils are equal, round, and reactive to light.  Neck: Normal range of motion. Neck supple. No JVD present. No thyromegaly present.  No supraclavicular adenopathy  Abdominal: Soft. Bowel sounds are normal. He exhibits  no distension and no mass. There is no tenderness.  Musculoskeletal: Normal range of motion. He exhibits no edema.  Lymphadenopathy:    He has no cervical adenopathy.  Neurological: He is alert and oriented to person, place, and time. He has normal strength. No cranial nerve deficit or sensory deficit.  Skin: Skin is warm and dry.  Psychiatric: He has a normal mood and affect.    Diagnostic Tests:  CLINICAL DATA: Abdominal aortic aneurysm status post endovascular repair complicated by type 2 endoleak  EXAM: CT ANGIOGRAPHY CHEST, ABDOMEN AND PELVIS  TECHNIQUE: Multidetector CT imaging through the chest, abdomen and pelvis was performed using the standard protocol during bolus administration of intravenous contrast. Multiplanar reconstructed images and MIPs were obtained and reviewed to evaluate the vascular anatomy.  CONTRAST: 171m OMNIPAQUE IOHEXOL 350 MG/ML SOLN  COMPARISON: None.  Most recent prior CT abdomen/ pelvis 12/08/2014  FINDINGS: CTA CHEST FINDINGS  Mediastinum: Unremarkable CT appearance of the thyroid gland. No suspicious mediastinal or hilar adenopathy. No soft tissue mediastinal mass. The thoracic esophagus is unremarkable.  Heart/Vascular: 2 vessel aortic arch. The left common carotid and right brachiocephalic artery share a common origin. The thoracic aorta is  within normal limits in caliber. The descending segment is tortuous. Scattered atherosclerotic vascular calcifications are present. The heart is enlarged. There is left ventricular dilatation. Calcifications are present along the course of the visualized coronary arteries. No pericardial effusion.  Lungs/Pleura: Surgical changes of prior left upper lobectomy. New developing soft tissue thickening in the left hilar region adjacent to the prior surgical resection bed. The soft tissue thickening tracks in a peribronchovascular fashion into the central aspect of the left lung. Given the a amorphous shape, precise measurements are difficult. The mass measures approximately 2.6 x 2.7 cm. This is a new finding compared to prior imaging from December 08, 2014 and July 21, 2014. No additional pulmonary nodules or masses. Trace dependent atelectasis in the right lower lobe.  Bones/Soft Tissues: No acute fracture or aggressive appearing lytic or blastic osseous lesion.  Review of the MIP images confirms the above findings.  CTA ABDOMEN AND PELVIS FINDINGS  VASCULAR  Aorta: Infrarenal abdominal aortic aneurysm status post endovascular repair with a bifurcated endoprosthesis extending from just below the renal arteries into the bilateral common iliac arteries. There is a persistent type 2 endoleak which appears to emanate from the origin of the inferior mesenteric artery. The outflow may be a low-lying accessory renal artery to the right lower pole. The excluded aneurysm sac remains stable by my measurements at 5.3 x 5.1 cm (confirmed on the coronal and sagittal reformatted images).  Celiac: Widely patent and unremarkable. Conventional hepatic arterial anatomy. No evidence of visceral artery aneurysm.  SMA: Widely patent and unremarkable.  Renals: Solitary dominant left renal artery. No significant stenosis. Dominant right main renal artery with mild atherosclerotic plaque but no  significant stenosis. There is a small accessory renal artery arising from the distal abdominal aorta and supplying the lower pole of the right kidney. This remains patent.  IMA: The IMA remains patent. There is a visible communication between the SMA and IMA via an arc of [place].  Inflow: Bilateral common iliac limbs remain patent. Mild ectasia of the internal, external and common iliac arteries without true aneurysm. No evidence of dissection or other complicating feature.  Proximal Outflow: Widely patent bilaterally.  Veins: No focal venous abnormality.  NON-VASCULAR  Abdomen: Unremarkable CT appearance of the stomach, duodenum, spleen, adrenal glands and pancreas. Normal hepatic  contour and morphology. 9 mm enhancing lesion in the dome of the right liver demonstrates peripheral incomplete nodular enhancement most consistent with a small hemangioma. Otherwise, no discrete lesion. No intra or extrahepatic biliary ductal dilatation. Stones are present within the gallbladder fundus.  Circumscribed water attenuation cystic lesions noted in both kidneys, larger on the left than the right. No enhancing renal mass. Mild atrophy with multifocal renal cortical scarring in the lower pole of the right kidney supplied by the accessory right renal artery.  Colonic diverticular disease without CT evidence of active inflammation. No evidence of obstruction or focal bowel wall thickening. Normal appendix in the right lower quadrant. The terminal ileum is unremarkable.  Pelvis: Marked prostatomegaly. The prostate gland measures at least 5.7 x 4.9 cm in demonstrates patchy internal enhancement. The bladder base is indented in the bladder wall appears diffusely mildly thickened and trabeculated. No free fluid or suspicious adenopathy.  Bones/Soft Tissues: No acute fracture or aggressive appearing lytic or blastic osseous lesion. Small fat containing left  inguinal hernia.  Review of the MIP images confirms the above findings.  IMPRESSION: VASCULAR  1. Infrarenal abdominal aortic aneurysm status post endovascular aortic repair complicated by a type 2 endoleak which appears to emanate from the inferior mesenteric artery with a possible outflow into a small accessory right lower pole renal artery. The excluded aneurysm sac remains unchanged at at 5.3 x 5.1 cm compared to 12/08/2014. Continued attention on follow-up imaging to assess for growth of the aneurysm sac over time. NON VASCULAR  1. Developing soft tissue density thickening in the left hilum along the prior left upper lobectomy resection site. Findings are concerning for local recurrence. Consider either further evaluation with bronchoscopy and possible ultrasound-guided endobronchial biopsy versus PET-CT. 2. Prostatomegaly with heterogeneous internal enhancement. 3. Diffuse mild bladder wall thickening may represent trabeculation in the setting of developing bladder outlet obstruction due to prostatomegaly. 4. Cholelithiasis. 5. Small fat containing left inguinal hernia. 6. Additional ancillary findings as above without significant interval change. These results were called by telephone at the time of interpretation on 06/07/2015 at 11:52 am to Dr. Julien Nordmann, who verbally acknowledged these results.  Signed,  Criselda Peaches, MD  [facility]  [facility]   Impression:  I have personally reviewed his current chest CT scan as well as all of his prior CT scans and initial PET scan. He has a new soft tissue mass in the left hilum that is new compared to his prior scan in April. This could be due to recurrent cancer but could also be inflammatory given his return to heavy smoking in the past few months. I think it would be best to do another PET scan and if this area is hypermetabolic proceed with bronchoscopy and  EBUS for biopsy of this area. I strongly encouraged him to discontinue smoking.  Plan:  PET scan to be done and then I will call him to discuss the results and plan bronchoscopy and EBUS as indicated. He has a follow up appt with Dr. Julien Nordmann in mid-November so we will try to have this resolved by that time.  Gaye Pollack, MD [facility] [phone]

## 2015-06-28 ENCOUNTER — Encounter: Payer: Self-pay | Admitting: *Deleted

## 2015-07-02 ENCOUNTER — Other Ambulatory Visit: Payer: Self-pay | Admitting: Surgery

## 2015-07-02 ENCOUNTER — Ambulatory Visit (HOSPITAL_COMMUNITY)
Admission: RE | Admit: 2015-07-02 | Discharge: 2015-07-02 | Disposition: A | Discharge status: Home / Self Care | Payer: Medicare Other | Source: Ambulatory Visit | Attending: Surgery | Admitting: Surgery

## 2015-07-02 DIAGNOSIS — R938 Abnormal findings on diagnostic imaging of other specified body structures: Secondary | ICD-10-CM | POA: Diagnosis not present

## 2015-07-02 DIAGNOSIS — Z9221 Personal history of antineoplastic chemotherapy: Secondary | ICD-10-CM | POA: Diagnosis not present

## 2015-07-02 DIAGNOSIS — N433 Hydrocele, unspecified: Secondary | ICD-10-CM | POA: Insufficient documentation

## 2015-07-02 DIAGNOSIS — Z923 Personal history of irradiation: Secondary | ICD-10-CM | POA: Diagnosis not present

## 2015-07-02 DIAGNOSIS — Z85118 Personal history of other malignant neoplasm of bronchus and lung: Secondary | ICD-10-CM

## 2015-07-02 DIAGNOSIS — C3412 Malignant neoplasm of upper lobe, left bronchus or lung: Secondary | ICD-10-CM | POA: Insufficient documentation

## 2015-07-02 DIAGNOSIS — I2510 Atherosclerotic heart disease of native coronary artery without angina pectoris: Secondary | ICD-10-CM | POA: Diagnosis not present

## 2015-07-02 DIAGNOSIS — I700 Atherosclerosis of aorta: Secondary | ICD-10-CM | POA: Diagnosis not present

## 2015-07-02 DIAGNOSIS — K8020 Calculus of gallbladder without cholecystitis without obstruction: Secondary | ICD-10-CM | POA: Insufficient documentation

## 2015-07-02 DIAGNOSIS — Z902 Acquired absence of lung [part of]: Secondary | ICD-10-CM | POA: Insufficient documentation

## 2015-07-02 DIAGNOSIS — C3492 Malignant neoplasm of unspecified part of left bronchus or lung: Secondary | ICD-10-CM | POA: Diagnosis not present

## 2015-07-02 DIAGNOSIS — R918 Other nonspecific abnormal finding of lung field: Secondary | ICD-10-CM

## 2015-07-02 DIAGNOSIS — N400 Enlarged prostate without lower urinary tract symptoms: Secondary | ICD-10-CM | POA: Insufficient documentation

## 2015-07-02 MED ORDER — FLUDEOXYGLUCOSE F - 18 (FDG) INJECTION
7.8000 | Freq: Once | INTRAVENOUS | Status: DC | PRN
  Administered 2015-07-02: 7.8 via INTRAVENOUS
  Filled 2015-07-02: qty 7.8

## 2015-07-05 ENCOUNTER — Encounter: Payer: Self-pay | Admitting: Surgery

## 2015-07-05 NOTE — Progress Notes (Unsigned)
Patient ID: ROCIO WOLAK, male   DOB: 03-09-1941, 74 y.o.   MRN: [id number]  I reviewed his PET scan done on 07/02/2015. The fullness in his left hilum seen on recent CT scan has resolved and was most likely due to mucous plugging in the left lower lobe bronchi in this area. There are two small areas of hypermetabolic uptake along a left hilar clip and in the left infrahilar area both of which have no CT correlate and may be inflammatory in this heavy smoker. There is focal hypermetabolic activity in the left posterior ninth rib without CT correlate and the patient relates that he recently hurt his back in this region and had muscle spasm and difficulty getting up for a few days. I think the best course of action in this 74 year old heavy smoker is to follow this up with another CT scan in 6 months. He is already scheduled for a CTA of the chest, abdomen and pelvis by Dr. Trula Slade on 12/13/2015 to follow up on a type 2 endoleak from EVAR of his AAA. I will see him back after that scan to discuss the results with him. I strongly advised him to quit smoking.

## 2015-07-06 LAB — GLUCOSE, CAPILLARY: Glucose-Capillary: 95 mg/dL (ref 65–99)

## 2015-07-07 ENCOUNTER — Other Ambulatory Visit (HOSPITAL_BASED_OUTPATIENT_CLINIC_OR_DEPARTMENT_OTHER): Payer: Medicare Other

## 2015-07-07 DIAGNOSIS — C3412 Malignant neoplasm of upper lobe, left bronchus or lung: Secondary | ICD-10-CM

## 2015-07-07 LAB — COMPREHENSIVE METABOLIC PANEL (CC13)
ALT: 23 U/L (ref 0–55)
AST: 21 U/L (ref 5–34)
Albumin: 3.7 g/dL (ref 3.5–5.0)
Alkaline Phosphatase: 67 U/L (ref 40–150)
Anion Gap: 8 mEq/L (ref 3–11)
BUN: 15.1 mg/dL (ref 7.0–26.0)
CO2: 28 mEq/L (ref 22–29)
Calcium: 10 mg/dL (ref 8.4–10.4)
Chloride: 105 mEq/L (ref 98–109)
Creatinine: 1 mg/dL (ref 0.7–1.3)
EGFR: 78 mL/min/{1.73_m2} — ABNORMAL LOW (ref >90)
Glucose: 100 mg/dl (ref 70–140)
Potassium: 5 mEq/L (ref 3.5–5.1)
Sodium: 141 mEq/L (ref 136–145)
Total Bilirubin: 0.34 mg/dL (ref 0.20–1.20)
Total Protein: 7.1 g/dL (ref 6.4–8.3)

## 2015-07-07 LAB — CBC WITH DIFFERENTIAL/PLATELET
BASO%: 0.8 % (ref 0.0–2.0)
Basophils Absolute: 0.1 10*3/uL (ref 0.0–0.1)
EOS%: 3.1 % (ref 0.0–7.0)
Eosinophils Absolute: 0.2 10*3/uL (ref 0.0–0.5)
HCT: 44.8 % (ref 38.4–49.9)
HGB: 15 g/dL (ref 13.0–17.1)
LYMPH%: 16.8 % (ref 14.0–49.0)
MCH: 28.9 pg (ref 27.2–33.4)
MCHC: 33.4 g/dL (ref 32.0–36.0)
MCV: 86.5 fL (ref 79.3–98.0)
MONO#: 0.5 10*3/uL (ref 0.1–0.9)
MONO%: 7.7 % (ref 0.0–14.0)
NEUT#: 4.9 10*3/uL (ref 1.5–6.5)
NEUT%: 71.6 % (ref 39.0–75.0)
Platelets: 265 10*3/uL (ref 140–400)
RBC: 5.18 10*6/uL (ref 4.20–5.82)
RDW: 15.2 % — ABNORMAL HIGH (ref 11.0–14.6)
WBC: 6.8 10*3/uL (ref 4.0–10.3)
lymph#: 1.1 10*3/uL (ref 0.9–3.3)

## 2015-07-13 ENCOUNTER — Encounter: Payer: Self-pay | Admitting: Internal Medicine

## 2015-07-13 ENCOUNTER — Other Ambulatory Visit: Payer: Self-pay | Admitting: Internal Medicine

## 2015-07-13 ENCOUNTER — Ambulatory Visit (HOSPITAL_BASED_OUTPATIENT_CLINIC_OR_DEPARTMENT_OTHER): Payer: Medicare Other | Admitting: Internal Medicine

## 2015-07-13 VITALS — BP 149/90 | HR 90 | Temp 98.2°F | Resp 18 | Ht 64.0 in | Wt 146.6 lb

## 2015-07-13 DIAGNOSIS — C3412 Malignant neoplasm of upper lobe, left bronchus or lung: Secondary | ICD-10-CM

## 2015-07-13 NOTE — Progress Notes (Signed)
[facility]  [phone] [phone] OFFICE PROGRESS NOTE  Jordan Hopper, MD [address] [place] [address]  DIAGNOSIS: Stage IIIA (T2a., N2, M0) non-small cell lung cancer adenocarcinoma with negative EGFR mutation and negative ALK gene translocation involving the left upper lobe middle mediastinal lymphadenopathy diagnosed in August of 2014  Lung cancer   Primary site: Lung (Left)   Staging method: AJCC 7th Edition   Clinical: Stage IIIA (T2a, N2, M0) signed by  , MD on 06/19/2013  5:06 PM   Summary: Stage IIIA (T2a, N2, M0).  Molecular biomarkers: Positive for NF1E1020fs*7, PTPN11 G503V, ATM splice site [id number]>TT, TP53 E271K, SETD2 N139fs*17 and SPOP E47K                                          Negative for: RET, ALK, BRAF, KRAS, ERBB2, MET and EGFR.  PRIOR THERAPY:  1) Status post left upper lobectomy with mediastinal lymph node dissection on 05/29/2013. 2) Adjuvant chemotherapy with cisplatin at 75 mg per meter squared and Alimta at 500 mg per meter squared given every 3 weeks for a total of 4 cycles. First cycle given on 07/10/2013 3) curative radiotherapy to the mediastinum under the care of Dr. Moody completed on 12/12/2013.  CURRENT THERAPY: Observation.  DISEASE STAGE: Lung cancer   Primary site: Lung (Left)   Staging method: AJCC 7th Edition   Clinical: Stage IIIA (T2a, N2, M0) signed by  , MD on 06/19/2013  5:06 PM   Summary: Stage IIIA (T2a, N2, M0)  CHEMOTHERAPY INTENT: Control/curative  CURRENT # OF CHEMOTHERAPY CYCLES: 0  CURRENT ANTIEMETICS: Aloxi, dexamethasone, Emend, Compazine  CURRENT SMOKING STATUS: Former smoker  ORAL CHEMOTHERAPY AND CONSENT: n/a  CURRENT BISPHOSPHONATES USE: none  PAIN MANAGEMENT: 0/10  NARCOTICS INDUCED CONSTIPATION: None  LIVING WILL AND CODE STATUS: Full code   INTERVAL HISTORY: Jordan Mccullough 74 y.o. male returns for 4 month followup visit.  The patient is feeling fine today with no specific complaints. He continues to have some hoarseness of his voice. He denied having any significant nausea or vomiting, no fever or chills. The patient denied having any significant chest pain, shortness of breath, or hemoptysis. He was found on previous CT scan of the chest to have questionable disease recurrence at the suture site. The patient underwent a PET scan that showed no evidence of hypermetabolic activity in that area. He was seen by Dr. Bartle. It was recommended for the patient to continue on observation. He is here today for reevaluation.  MEDICAL HISTORY: Past Medical History  Diagnosis Date  . Hyperlipidemia   . Diverticulosis   . Coronary artery disease     MI 1992, S/P  PTCA; negative stress test in November 2011 with no ischemia.   . Peripheral vascular disease (HCC) 1/14    4.8x4.6 cm infrarenal abdominal aortic fusiform aneurysm, 1.5 cm right common iliac artery aneurysm  . AAA (abdominal aortic aneurysm) (HCC)   . Hearing loss     Bilateral   . Myocardial infarction (HCC) 1992    Dr McAlhany    . History of radiation therapy 11/10/13-12/12/13    lung,50Gy/25fx  . Lung cancer (HCC) 10-14    non-small cell lung cancer  . Hypertension   . Shortness of breath     ALLERGIES:  is allergic to iodinated diagnostic agents.  MEDICATIONS:  Current Outpatient Prescriptions    Medication Sig Dispense Refill  . amLODipine (NORVASC) 5 MG tablet Take 1 tablet (5 mg total) by mouth daily. 30 tablet 5  . aspirin EC 81 MG tablet Take 81 mg by mouth daily.    . budesonide-formoterol (SYMBICORT) 160-4.5 MCG/ACT inhaler Inhale 1-2 puffs into the lungs every 12 (twelve) hours. Gargle and spit after use. 1 Inhaler 2  . CRESTOR 40 MG tablet TAKE 1 TABLET (40 MG TOTAL) BY MOUTH DAILY. 30 tablet 5  . Glucosamine-Chondroit-Vit C-Mn (GLUCOSAMINE 1500 COMPLEX PO) Take 1,500 mg by mouth daily.     . metoprolol (LOPRESSOR) 50 MG tablet Take 1 tablet  (50 mg total) by mouth 2 (two) times daily. 180 tablet 1  . Multiple Vitamin (MULTIVITAMIN) tablet Take 1 tablet by mouth every evening.      No current facility-administered medications for this visit.    SURGICAL HISTORY:  Past Surgical History  Procedure Laterality Date  . Ptca  1992  . Inguinal heriiorrhaphy bilaterally    . Colonoscopy w/ polypectomy  2003     negative 2010,due 2020; Dr Brodie  . Pilonidal cyst excision    . Rotator cuff repair      Bilateral  . Cystoscopy/retrograde/ureteroscopy Bilateral 10/09/2012    Procedure: BILATERAL RETROGRADE bladder and urethral BIOPSY ;  Surgeon: Daniel Young Woodruff, MD;  Location: [facility];  Service: Urology;  Laterality: Bilateral;  BILATERAL RETROGRADE  AND bladder and urethral BIOPSY    . Prostate biopsy N/A 10/09/2012    Procedure: PROSTATIC URETHRAL BIOPSY;  Surgeon: Daniel Young Woodruff, MD;  Location: [facility];  Service: Urology;  Laterality: N/A;  PROSTATIC URETHRAL BIOPSY    . Hernia repair Bilateral     Inguinal  . Mediastinoscopy N/A 05/01/2013    Procedure: MEDIASTINOSCOPY;  Surgeon: Bryan K Bartle, MD;  Location: [facility];  Service: Thoracic;  Laterality: N/A;  . Video bronchoscopy N/A 05/01/2013    Procedure: VIDEO BRONCHOSCOPY;  Surgeon: Bryan K Bartle, MD;  Location: [facility];  Service: Thoracic;  Laterality: N/A;  . Coronary angioplasty      no stents  . Thorocotomy with lobectomy Left 05/29/2013    Procedure: LEFT THOROCOTOMY WITH LEFT UPPER LOBE LOBECTOMY;  Surgeon: Bryan K Bartle, MD;  Location: [facility];  Service: Thoracic;  Laterality: Left;  . Abdominal aortic endovascular stent graft N/A 05/07/2014    Procedure: ABDOMINAL AORTIC ENDOVASCULAR STENT GRAFT;  Surgeon: Vance W Brabham, MD;  Location: [facility];  Service: Vascular;  Laterality: N/A;    REVIEW OF SYSTEMS:  A comprehensive review of systems was negative except for: Ears, nose, mouth, throat, and face: positive for hoarseness   PHYSICAL EXAMINATION: General  appearance: alert, cooperative, appears stated age and no distress Head: Normocephalic, without obvious abnormality, atraumatic Neck: no adenopathy, no carotid bruit, no JVD, supple, symmetrical, trachea midline and thyroid not enlarged, symmetric, no tenderness/mass/nodules Lymph nodes: Cervical, supraclavicular, and axillary nodes normal. Resp: clear to auscultation bilaterally Cardio: S1, S2 normal and Occasional missed/skipped beat(s) GI: soft, non-tender; bowel sounds normal; no masses,  no organomegaly Extremities: extremities normal, atraumatic, no cyanosis or edema Neurologic: Alert and oriented X 3, normal strength and tone. Normal symmetric reflexes. Normal coordination and gait  ECOG PERFORMANCE STATUS: 1 - Symptomatic but completely ambulatory  Blood pressure 149/90, pulse 90, temperature 98.2 F (36.8 C), temperature source Oral, resp. rate 18, height 5' 4" (1.626 m), weight 146 lb 9.6 oz (66.497 kg), SpO2 98 %.  LABORATORY DATA: Lab Results  Component Value Date     WBC 6.8 07/07/2015   HGB 15.0 07/07/2015   HCT 44.8 07/07/2015   MCV 86.5 07/07/2015   PLT 265 07/07/2015      Chemistry      Component Value Date/Time   NA 141 07/07/2015 1250   NA 137 05/08/2014 0500   K 5.0 07/07/2015 1250   K 4.1 05/08/2014 0500   CL 100 05/08/2014 0500   CO2 28 07/07/2015 1250   CO2 24 05/08/2014 0500   BUN 15.1 07/07/2015 1250   BUN 9 05/08/2014 0500   CREATININE 1.0 07/07/2015 1250   CREATININE 0.68 05/08/2014 0500   CREATININE 0.78 04/09/2014 0953      Component Value Date/Time   CALCIUM 10.0 07/07/2015 1250   CALCIUM 8.6 05/08/2014 0500   ALKPHOS 67 07/07/2015 1250   ALKPHOS 68 05/05/2014 1055   AST 21 07/07/2015 1250   AST 27 05/05/2014 1055   ALT 23 07/07/2015 1250   ALT 35 05/05/2014 1055   BILITOT 0.34 07/07/2015 1250   BILITOT 0.3 05/05/2014 1055       RADIOGRAPHIC STUDIES: Nm Pet Image Restag (ps) Skull Base To Thigh  07/02/2015  CLINICAL DATA:   Subsequent treatment strategy for left upper lobe lung cancer. Radiation and chemotherapy in 2015. EXAM: NUCLEAR MEDICINE PET SKULL BASE TO THIGH TECHNIQUE: 7.8 mCi F-18 FDG was injected intravenously. Full-ring PET imaging was performed from the skull base to thigh after the radiotracer. CT data was obtained and used for attenuation correction and anatomic localization. FASTING BLOOD GLUCOSE:  Value: 95 mg/dl COMPARISON:  Multiple exams, including 06/07/2015 and 04/17/2013 FINDINGS: NECK Asymmetric right glottic activity, maximum standard uptake value 5.4, without significant CT correlate. CHEST Postoperative findings along the left hilum. Tiny focus of activity along a left hilar clip with maximum standard uptake value 4.2, but without CT correlate. Small left infrahilar focus of very faintly increased activity, maximum standard uptake value 3.6 along the hilar vessels, nonspecific but quite possibly inflammatory. Left upper lobectomy. Compared to the recent chest CT of 06/07/2015, there appears to been clearing of the left lower lobe bronchial occlusion that was contributing to the opacity at the left hilum. With node on image 25 of series 8, there is stenosis of the left lower lobe bronchus focally due to an infolding. Coronary, aortic arch, and branch vessel atherosclerotic vascular disease. ABDOMEN/PELVIS Photopenic left renal cysts. Non rotated right kidney. Infrarenal abdominal aortic aneurysm spanned by stent graft. Slight prominence the right distal ureter. Enlarged prostate gland indents the bladder base. Borderline wall thickening in the urinary bladder, as before. 1.5 cm gallstone noted. Fluid density in the left scrotum. SKELETON Hypermetabolic focus in the left ninth rib posterolaterally, maximum standard uptake value 5.5, not previously present. No definite correlating CT finding. IMPRESSION: 1. The fullness in the left hilum/bronchial tree on the recent chest CT is attributed to plugging of the left  lower lobe bronchi in this vicinity, which has subsequently cleared. Several faint foci of increased activity along postoperative areas of the left hilum are nonspecific, and do not have a CT correlate -these could be benign but merit attention on followup. There is a infolding of the anterior margin of the left lower lobe bronchus on image 66 of series 4 which causes focal stenosis of the left bronchial tree and which may predispose to mucus plugging. 2. Focal hypermetabolic activity in a left posterior ninth rib without CT correlate, uncertain significance. Early metastatic disease is not totally excluded and attention to this region on follow up  imaging is recommended. This could also be from otherwise occult rib trauma. 3. Enlarged prostate gland indents the bladder base. 4. Asymmetric glottic activity, probably incidental given the lack of CT correlate. 5. Coronary, aortic arch, and branch vessel atherosclerotic vascular disease. 6. Cholelithiasis. 7. Left hydrocele. Electronically Signed   By: Walter  Liebkemann M.D.   On: 07/02/2015 16:03   ASSESSMENT/PLAN: This is a very pleasant 74-year-old Caucasian male recently diagnosed with stage IIIA (T2a., N2, M0) non-small cell lung cancer, adenocarcinoma involving the left upper lobe and mediastinal lymphadenopathy diagnosed in August of 2014. He is status post left upper lobectomy with mediastinal lymph node dissection on 05/29/2013. He is status post 4 cycles of adjuvant chemotherapy with cisplatin and Alimta. The patient is doing fairly well today. The recent CT scan of the chest showed development of soft tissue density thickening in the left hilum along the prior left upper lobectomy resection site concerning for local recurrence. The patient had a PET scan that showed no evidence for disease recurrence. I discussed the PET scan results with the patient today. I recommended for him to continue on observation. He has CT scan of the chest as scheduled on  12/14/2014 by Dr. Barham. I would see the patient back for follow-up visit at that time for reevaluation and discussion of his scan results. He was advised to call immediately if he has any concerning symptoms in the interval. All questions were answered. The patient knows to call the clinic with any problems, questions or concerns. We can certainly see the patient much sooner if necessary.  Disclaimer: This note was dictated with voice recognition software. Similar sounding words can inadvertently be transcribed and may not be corrected upon review.     

## 2015-07-14 ENCOUNTER — Other Ambulatory Visit: Payer: Self-pay | Admitting: Emergency Medicine

## 2015-07-14 MED ORDER — METOPROLOL TARTRATE 50 MG PO TABS: 50.0000 mg | ORAL_TABLET | Freq: Two times a day (BID) | ORAL | Status: DC

## 2015-07-22 DIAGNOSIS — K8020 Calculus of gallbladder without cholecystitis without obstruction: Secondary | ICD-10-CM

## 2015-07-22 HISTORY — DX: Calculus of gallbladder without cholecystitis without obstruction: K80.20

## 2015-07-22 HISTORY — PX: OTHER SURGICAL HISTORY: SHX169

## 2015-07-22 HISTORY — PX: TRANSTHORACIC ECHOCARDIOGRAM: SHX275

## 2015-08-04 ENCOUNTER — Emergency Department (HOSPITAL_COMMUNITY): Payer: Medicare Other

## 2015-08-04 ENCOUNTER — Ambulatory Visit (INDEPENDENT_AMBULATORY_CARE_PROVIDER_SITE_OTHER): Payer: Medicare Other

## 2015-08-04 ENCOUNTER — Encounter (HOSPITAL_COMMUNITY): Payer: Self-pay | Admitting: Emergency Medicine

## 2015-08-04 ENCOUNTER — Observation Stay (HOSPITAL_COMMUNITY)
Admission: EM | Admit: 2015-08-04 | Discharge: 2015-08-06 | Disposition: A | Discharge status: Home / Self Care | Payer: Medicare Other | Attending: Internal Medicine | Admitting: Internal Medicine

## 2015-08-04 DIAGNOSIS — F17290 Nicotine dependence, other tobacco product, uncomplicated: Secondary | ICD-10-CM | POA: Diagnosis not present

## 2015-08-04 DIAGNOSIS — I10 Essential (primary) hypertension: Secondary | ICD-10-CM | POA: Diagnosis not present

## 2015-08-04 DIAGNOSIS — I739 Peripheral vascular disease, unspecified: Secondary | ICD-10-CM | POA: Insufficient documentation

## 2015-08-04 DIAGNOSIS — Z79899 Other long term (current) drug therapy: Secondary | ICD-10-CM | POA: Insufficient documentation

## 2015-08-04 DIAGNOSIS — I252 Old myocardial infarction: Secondary | ICD-10-CM | POA: Diagnosis not present

## 2015-08-04 DIAGNOSIS — R55 Syncope and collapse: Secondary | ICD-10-CM

## 2015-08-04 DIAGNOSIS — Z9221 Personal history of antineoplastic chemotherapy: Secondary | ICD-10-CM | POA: Diagnosis not present

## 2015-08-04 DIAGNOSIS — C3490 Malignant neoplasm of unspecified part of unspecified bronchus or lung: Secondary | ICD-10-CM | POA: Diagnosis not present

## 2015-08-04 DIAGNOSIS — Z7982 Long term (current) use of aspirin: Secondary | ICD-10-CM | POA: Insufficient documentation

## 2015-08-04 DIAGNOSIS — R61 Generalized hyperhidrosis: Secondary | ICD-10-CM | POA: Insufficient documentation

## 2015-08-04 DIAGNOSIS — Z9861 Coronary angioplasty status: Secondary | ICD-10-CM | POA: Insufficient documentation

## 2015-08-04 DIAGNOSIS — Z23 Encounter for immunization: Secondary | ICD-10-CM | POA: Diagnosis not present

## 2015-08-04 DIAGNOSIS — I2510 Atherosclerotic heart disease of native coronary artery without angina pectoris: Secondary | ICD-10-CM | POA: Diagnosis not present

## 2015-08-04 DIAGNOSIS — I714 Abdominal aortic aneurysm, without rupture, unspecified: Secondary | ICD-10-CM | POA: Diagnosis present

## 2015-08-04 DIAGNOSIS — R1110 Vomiting, unspecified: Secondary | ICD-10-CM | POA: Diagnosis not present

## 2015-08-04 DIAGNOSIS — R112 Nausea with vomiting, unspecified: Secondary | ICD-10-CM | POA: Diagnosis not present

## 2015-08-04 DIAGNOSIS — C7931 Secondary malignant neoplasm of brain: Secondary | ICD-10-CM | POA: Diagnosis not present

## 2015-08-04 DIAGNOSIS — E785 Hyperlipidemia, unspecified: Secondary | ICD-10-CM | POA: Insufficient documentation

## 2015-08-04 DIAGNOSIS — R569 Unspecified convulsions: Secondary | ICD-10-CM | POA: Diagnosis not present

## 2015-08-04 DIAGNOSIS — Z923 Personal history of irradiation: Secondary | ICD-10-CM | POA: Insufficient documentation

## 2015-08-04 DIAGNOSIS — F17210 Nicotine dependence, cigarettes, uncomplicated: Secondary | ICD-10-CM | POA: Diagnosis not present

## 2015-08-04 DIAGNOSIS — C3412 Malignant neoplasm of upper lobe, left bronchus or lung: Secondary | ICD-10-CM | POA: Diagnosis present

## 2015-08-04 LAB — COMPREHENSIVE METABOLIC PANEL
ALT: 28 U/L (ref 17–63)
AST: 23 U/L (ref 15–41)
Albumin: 4.2 g/dL (ref 3.5–5.0)
Alkaline Phosphatase: 62 U/L (ref 38–126)
Anion gap: 11 (ref 5–15)
BUN: 13 mg/dL (ref 6–20)
CO2: 25 mmol/L (ref 22–32)
Calcium: 9.4 mg/dL (ref 8.9–10.3)
Chloride: 102 mmol/L (ref 101–111)
Creatinine, Ser: 0.84 mg/dL (ref 0.61–1.24)
GFR calc Af Amer: >60 mL/min (ref >60)
GFR calc non Af Amer: >60 mL/min (ref >60)
Glucose, Bld: 104 mg/dL — ABNORMAL HIGH (ref 65–99)
Potassium: 4.2 mmol/L (ref 3.5–5.1)
Sodium: 138 mmol/L (ref 135–145)
Total Bilirubin: 0.7 mg/dL (ref 0.3–1.2)
Total Protein: 7.4 g/dL (ref 6.5–8.1)

## 2015-08-04 LAB — CBC WITH DIFFERENTIAL/PLATELET
Basophils Absolute: 0 10*3/uL (ref 0.0–0.1)
Basophils Relative: 0 %
Eosinophils Absolute: 0.1 10*3/uL (ref 0.0–0.7)
Eosinophils Relative: 0 %
HCT: 43.8 % (ref 39.0–52.0)
Hemoglobin: 15 g/dL (ref 13.0–17.0)
Lymphocytes Relative: 4 %
Lymphs Abs: 0.7 10*3/uL (ref 0.7–4.0)
MCH: 29.6 pg (ref 26.0–34.0)
MCHC: 34.2 g/dL (ref 30.0–36.0)
MCV: 86.4 fL (ref 78.0–100.0)
Monocytes Absolute: 0.4 10*3/uL (ref 0.1–1.0)
Monocytes Relative: 3 %
Neutro Abs: 16.5 10*3/uL — ABNORMAL HIGH (ref 1.7–7.7)
Neutrophils Relative %: 93 %
Platelets: 213 10*3/uL (ref 150–400)
RBC: 5.07 MIL/uL (ref 4.22–5.81)
RDW: 14.7 % (ref 11.5–15.5)
WBC: 17.7 10*3/uL — ABNORMAL HIGH (ref 4.0–10.5)

## 2015-08-04 LAB — TROPONIN I: Troponin I: <0.03 ng/mL (ref <0.031)

## 2015-08-04 LAB — LIPASE, BLOOD: Lipase: 40 U/L (ref 11–51)

## 2015-08-04 MED ORDER — LORAZEPAM 2 MG/ML IJ SOLN
1.0000 mg | Freq: Once | INTRAMUSCULAR | Status: AC
  Administered 2015-08-04: 1 mg via INTRAVENOUS
  Filled 2015-08-04: qty 1

## 2015-08-04 MED ORDER — SODIUM CHLORIDE 0.9 % IV BOLUS (SEPSIS)
500.0000 mL | Freq: Once | INTRAVENOUS | Status: AC
  Administered 2015-08-04: 500 mL via INTRAVENOUS

## 2015-08-04 MED ORDER — GADOBENATE DIMEGLUMINE 529 MG/ML IV SOLN
15.0000 mL | Freq: Once | INTRAVENOUS | Status: AC | PRN
  Administered 2015-08-04: 13 mL via INTRAVENOUS

## 2015-08-04 MED ORDER — LEVETIRACETAM 500 MG/5ML IV SOLN
1000.0000 mg | Freq: Once | INTRAVENOUS | Status: AC
  Administered 2015-08-04: 1000 mg via INTRAVENOUS
  Filled 2015-08-04: qty 10

## 2015-08-04 NOTE — ED Notes (Signed)
Per EMS pt got flu shot this am at 9am.  Pt began vomiting at around 3pm.  Pt decided to drink a beer.  Wife had left room.  When came back, pt eyes rolled back and pt shaking.  ? Just head involvement.  Family reported she had left room less than a minute and when returned event was only witnessed for approx 5-10 seconds.  No hx of seizures.  Pt not postictal on arrival of EMS.  Less than 5 min response time to site.  Pt from home.  Pt refused initial transport.  Vitals:  148 80, hr 96, cbg 99, 94% ra

## 2015-08-04 NOTE — ED Notes (Signed)
Patient transported to XR. 

## 2015-08-04 NOTE — H&P (Signed)
PCP:  [name], MD  Oncology [facility]  Referring provider Pfeiffer   Chief Complaint:   Seizure like activity  HPI: Jordan Mccullough is a 74 y.o. male   has a past medical history of Hyperlipidemia; Diverticulosis; Coronary artery disease; Peripheral vascular disease ([place]) (1/14); AAA (abdominal aortic aneurysm) ([place]); Hearing loss; Myocardial infarction ([place]) (1992); History of radiation therapy (11/10/13-12/12/13); Lung cancer ([place]) (10-14); Hypertension; and Shortness of breath.   Presented with nausea and vomiting starting at 2 PM this was associated with diaphoresis but no chest pain. No Shortness of breath. He did reports a headache.  He felt lightheaded stating he was about to pass out.    Of note early in the morning patient had had a flu shot at 9:30 Patient was not feeling well after having repeated nausea and vomiting. He decided to drink some beer to make some feel better. Wife left the room and when she came back from him with his eyes rolled in the back of his head and  He was shaking all over. Patient usually drinks about 6 beers a day. Family denies hx of withdrawal.  Wife Witnessed 5-10 seconds of activity. No bowel or urinary incontinence. Wife describes his arms were up in the air and rhythmically shakeing There is no prior history of seizures. Upon arrival of EMS patient was not postictal. CT scan of the head was done showing 1.6 x 1.5 hyperdense mass in the right frontal lobe worrisome for focal metastases. MRI was done showing 2 enhancing masses in the right frontal lobe measuring up to 2 x 1.7 cm no mass effect. ER provider has discussed case with neurosurgery as well as neurology.  Neurology recommendations will start on Keppra  And obtain EEG, may require radiation treatment will need to discuss with radiation oncology tomorrow  Examination unknown history of stage IIIA lung cancer to left upper lobe status post chemotherapy and radiation therapy completed in  2015 Hospitalist was called for admission for seizure-like activity and is setting of newly diagnosed metastatic spread of lung cancer to the brain.  Review of Systems:    Pertinent positives include:  nausea, vomiting, seizure-like activity  Constitutional:  No weight loss, night sweats, Fevers, chills, fatigue, weight loss  HEENT:  No headaches, Difficulty swallowing,Tooth/dental problems,Sore throat,  No sneezing, itching, ear ache, nasal congestion, post nasal drip,  Cardio-vascular:  No chest pain, Orthopnea, PND, anasarca, dizziness, palpitations.no Bilateral lower extremity swelling  GI:  No heartburn, indigestion, abdominal pain,  diarrhea, change in bowel habits, loss of appetite, melena, blood in stool, hematemesis Resp:  no shortness of breath at rest. No dyspnea on exertion, No excess mucus, no productive cough, No non-productive cough, No coughing up of blood.No change in color of mucus.No wheezing. Skin:  no rash or lesions. No jaundice GU:  no dysuria, change in color of urine, no urgency or frequency. No straining to urinate.  No flank pain.  Musculoskeletal:  No joint pain or no joint swelling. No decreased range of motion. No back pain.  Psych:  No change in mood or affect. No depression or anxiety. No memory loss.  Neuro: no localizing neurological complaints, no tingling, no weakness, no double vision, no gait abnormality, no slurred speech, no confusion  Otherwise ROS are negative except for above, 10 systems were reviewed  Past Medical History: Past Medical History  Diagnosis Date  . Hyperlipidemia   . Diverticulosis   . Coronary artery disease     MI 1992, S/P  PTCA; negative stress test in November 2011 with no ischemia.   . Peripheral vascular disease ([place]) 1/14    4.8x4.6 cm infrarenal abdominal aortic fusiform aneurysm, 1.5 cm right common iliac artery aneurysm  . AAA (abdominal aortic aneurysm) ([place])   . Hearing loss     Bilateral   . Myocardial  infarction [facility]) 1992    Dr Angelena Form    . History of radiation therapy 11/10/13-12/12/13    lung,50Gy/66f  . [place]    non-small cell lung cancer  . Hypertension   . Shortness of breath    Past Surgical History  Procedure Laterality Date  . Ptca  1992  . Inguinal heriiorrhaphy bilaterally    . Colonoscopy w/ polypectomy  2003     negative 2010,due 2020; Dr BOlevia Perches . Pilonidal cyst excision    . Rotator cuff repair      Bilateral  . Cystoscopy/retrograde/ureteroscopy Bilateral 10/09/2012    Procedure: BILATERAL RETROGRADE bladder and urethral BIOPSY ;  Surgeon: DMolli Hazard MD;  Location: [facility];  Service: Urology;  Laterality: Bilateral;  BILATERAL RETROGRADE  AND bladder and urethral BIOPSY    . Prostate biopsy N/A 10/09/2012    Procedure: PROSTATIC URETHRAL BIOPSY;  Surgeon: DMolli Hazard MD;  Location: [facility];  Service: Urology;  Laterality: N/A;  PROSTATIC URETHRAL BIOPSY    . Hernia repair Bilateral     Inguinal  . Mediastinoscopy N/A 05/01/2013    Procedure: MEDIASTINOSCOPY;  Surgeon: BGaye Pollack MD;  [facility];  Service: Thoracic;  Laterality: N/A;  . Video bronchoscopy N/A 05/01/2013    Procedure: VIDEO BRONCHOSCOPY;  Surgeon: BGaye Pollack MD;  [facility];  Service: Thoracic;  Laterality: N/A;  . Coronary angioplasty      no stents  . Thorocotomy with lobectomy Left 05/29/2013    Procedure: LEFT THOROCOTOMY WITH LEFT UPPER LOBE LOBECTOMY;  Surgeon: BGaye Pollack MD;  [facility];  Service: Thoracic;  Laterality: Left;  . Abdominal aortic endovascular stent graft N/A 05/07/2014    Procedure: ABDOMINAL AORTIC ENDOVASCULAR STENT GRAFT;  Surgeon: VSerafina Mitchell MD;  [facility];  Service: Vascular;  Laterality: N/A;     Medications: Prior to Admission medications   Medication Sig Start Date End Date Taking? Authorizing Provider  amLODipine (NORVASC) 5 MG tablet Take 1 tablet (5 mg total) by mouth daily.  04/12/15  Yes WHendricks Limes MD  aspirin EC 81 MG tablet Take 81 mg by mouth daily.   Yes Historical Provider, MD  budesonide-formoterol (SYMBICORT) 160-4.5 MCG/ACT inhaler Inhale 1-2 puffs into the lungs every 12 (twelve) hours. Gargle and spit after use. 06/07/15  Yes WHendricks Limes MD  CRESTOR 40 MG tablet TAKE 1 TABLET (40 MG TOTAL) BY MOUTH DAILY. 05/24/15  Yes WHendricks Limes MD  Glucosamine-Chondroit-Vit C-Mn (GLUCOSAMINE 1500 COMPLEX PO) Take 1,500 mg by mouth daily.    Yes Historical Provider, MD  metoprolol (LOPRESSOR) 50 MG tablet Take 1 tablet (50 mg total) by mouth 2 (two) times daily. --- Pt needs appt for further refills. 07/14/15  Yes WHendricks Limes MD  Multiple Vitamin (MULTIVITAMIN) tablet Take 1 tablet by mouth every evening.    Yes Historical Provider, MD    Allergies:   Allergies  Allergen Reactions  . Fluvirin [Influenza Vac Typ A&B Surf Ant] Nausea And Vomiting and Other (See Comments)    Seizure like activity?  .Marland KitchenIodinated Diagnostic Agents Hives    1  hive on lt cheek lasting approximately 1 hour on last 2 CT per pt; needs pre meds in future; 50 mg benadryl po 1 hr prior to exam per Dr. Weber Cooks    Social History:  Ambulatory   Independently  Lives at home  With family     reports that he quit smoking about 2 years ago. His smoking use included Cigarettes and Cigars. He has a 24 pack-year smoking history. He quit smokeless tobacco use about 2 years ago. He reports that he drinks about 9.0 oz of alcohol per week. He reports that he does not use illicit drugs.    Family History: family history includes Cancer in his father and sister; Heart attack in his brother; Hyperlipidemia in his brother and sister; Hypertension in his brother and mother; Lung cancer in his sister; Prostate cancer in his father. There is no history of Diabetes or Stroke.    Physical Exam: Patient Vitals for the past 24 hrs:  BP Temp Temp src Pulse Resp SpO2  08/04/15 2128 113/76  mmHg - - 97 16 94 %  08/04/15 1754 135/85 mmHg 98.2 F (36.8 C) Oral 98 17 95 %    1. General:  in No Acute distress 2. Psychological: somnolent  But Oriented when arroused 3. Head/ENT:    Dry Mucous Membranes                          Head Non traumatic, neck supple                          Normal  Dentition 4. SKIN:     decreased Skin turgor,  Skin clean Dry and intact no rash 5. Heart: Regular rate and rhythm no Murmur, Rub or gallop 6. Lungs: Clear to auscultation bilaterally, no wheezes or crackles   7. Abdomen: Soft, non-tender, Non distended 8. Lower extremities: no clubbing, cyanosis, or edema 9. Neurologically strength 5 out of 5 in all 4 extremities cranial nerves II through XII intact 10. MSK: Normal range of motion  body mass index is unknown because there is no weight on file.   Labs on Admission:   Results for orders placed or performed during the hospital encounter of 08/04/15 (from the past 24 hour(s))  Comprehensive metabolic panel     Status: Abnormal   Collection Time: 08/04/15  6:37 PM  Result Value Ref Range   Sodium 138 135 - 145 mmol/L   Potassium 4.2 3.5 - 5.1 mmol/L   Chloride 102 101 - 111 mmol/L   CO2 25 22 - 32 mmol/L   Glucose, Bld 104 (H) 65 - 99 mg/dL   BUN 13 6 - 20 mg/dL   Creatinine, Ser 0.84 0.61 - 1.24 mg/dL   Calcium 9.4 8.9 - 10.3 mg/dL   Total Protein 7.4 6.5 - 8.1 g/dL   Albumin 4.2 3.5 - 5.0 g/dL   AST 23 15 - 41 U/L   ALT 28 17 - 63 U/L   Alkaline Phosphatase 62 38 - 126 U/L   Total Bilirubin 0.7 0.3 - 1.2 mg/dL   GFR calc non Af Amer >60 >60 mL/min   GFR calc Af Amer >60 >60 mL/min   Anion gap 11 5 - 15  Lipase, blood     Status: None   Collection Time: 08/04/15  6:37 PM  Result Value Ref Range   Lipase 40 11 - 51 U/L  Troponin I  Status: None   Collection Time: 08/04/15  6:37 PM  Result Value Ref Range   Troponin I <0.03 <0.031 ng/mL  CBC with Differential     Status: Abnormal   Collection Time: 08/04/15  6:37 PM    Result Value Ref Range   WBC 17.7 (H) 4.0 - 10.5 K/uL   RBC 5.07 4.22 - 5.81 MIL/uL   Hemoglobin 15.0 13.0 - 17.0 g/dL   HCT 43.8 39.0 - 52.0 %   MCV 86.4 78.0 - 100.0 fL   MCH 29.6 26.0 - 34.0 pg   MCHC 34.2 30.0 - 36.0 g/dL   RDW 14.7 11.5 - 15.5 %   Platelets 213 150 - 400 K/uL   Neutrophils Relative % 93 %   Neutro Abs 16.5 (H) 1.7 - 7.7 K/uL   Lymphocytes Relative 4 %   Lymphs Abs 0.7 0.7 - 4.0 K/uL   Monocytes Relative 3 %   Monocytes Absolute 0.4 0.1 - 1.0 K/uL   Eosinophils Relative 0 %   Eosinophils Absolute 0.1 0.0 - 0.7 K/uL   Basophils Relative 0 %   Basophils Absolute 0.0 0.0 - 0.1 K/uL    UA not obtained  Lab Results  Component Value Date   HGBA1C 5.7 07/04/2013    Estimated Creatinine Clearance: 65.6 mL/min (by C-G formula based on Cr of 0.84).  BNP (last 3 results) No results for input(s): PROBNP in the last 8760 hours.  Other results:  I have pearsonaly reviewed this: ECG REPORT  Rate: 101  Rhythm: Sinus tachycardia ST&T Change: nonspecific ST segment changes QTC 461  There were no vitals filed for this visit.   Cultures: No results found for: [place], [place], CULT, REPTSTATUS   Radiological Exams on Admission: Dg Chest 2 View  08/04/2015  CLINICAL DATA:  Seizure-like episode. Lung cancer. Hypertension. Ex-smoker. EXAM: CHEST  2 VIEW COMPARISON:  10/23/2014 and PET of 07/02/2015 FINDINGS: Left rotator cuff repair. Midline trachea. Mild cardiomegaly. Surgical clips in the left hilum with volume loss in the left hemithorax and elevation of the left hemidiaphragm. No pleural effusion or pneumothorax. Mild scarring at the left lung base. Aortic endograft repair. IMPRESSION: Similar appearance of surgical changes in the left hemi thorax. No acute superimposed process. Electronically Signed   By: Abigail Miyamoto M.D.   On: 08/04/2015 19:41   Ct Head Wo Contrast  08/04/2015  CLINICAL DATA:  Seizure-like episode.  The onset was unwitnessed. EXAM: CT  HEAD WITHOUT CONTRAST TECHNIQUE: Contiguous axial images were obtained from the base of the skull through the vertex without intravenous contrast. COMPARISON:  MRI brain 10/17/2013. FINDINGS: A 1.6 x 1.5 cm hyperdense mass lesion is present in the high right frontal lobe with surrounding vasogenic edema. No other focal mass lesions are present. There are no other focal areas of edema. No other acute infarct is present. There is no acute hemorrhage. The ventricles are of normal size. No significant extra-axial fluid collection is present. The paranasal sinuses and mastoid air cells are clear. Calvarium is intact. No significant extracranial soft tissue lesions are present. IMPRESSION: 1. 1.6 x 1.5 cm hyperdense mass lesion in the high right frontal lobe is most concerning for a focal metastasis. 2. No other focal lesions are evident. Recommend MRI the brain without and with contrast for further evaluation of metastatic disease. These results were called by telephone at the time of interpretation on 08/04/2015 at 7:47 pm to Dr. Charlesetta Shanks , who verbally acknowledged these results. Electronically Signed   By:  San Morelle M.D.   On: 08/04/2015 19:48   Mr Jeri Cos DD Contrast  08/04/2015  CLINICAL DATA:  Flu shot at 9 a.m., vomiting beginning at 3 p.m. drank beer. Sudden onset seizure like symptoms. EXAM: MRI HEAD WITHOUT AND WITH CONTRAST TECHNIQUE: Multiplanar, multiecho pulse sequences of the brain and surrounding structures were obtained without and with intravenous contrast. CONTRAST:  68m MULTIHANCE GADOBENATE DIMEGLUMINE 529 MG/ML IV SOLN COMPARISON:  CT head August 04, 2015 at 1939 hours FINDINGS: Multiple sequences are mildly more moderately motion degraded. RIGHT posterior frontal 2 x 1.7 cm rounded mass in the precentral gyrus demonstrates thin smooth peripheral enhancement. No associated susceptibility artifact to suggest hemorrhage, no reduced diffusion to suggest hypercellular tumor. A  second T2 bright 11 x 7 mm mass within the RIGHT posterior frontal gray-white matter junction with similar peripheral enhancement. Small area of subtle FLAIR T2 hyperintense signal in the LEFT temporal cortex without enhancement favors artifact, less likely nonenhancing tiny metastatic deposit. No midline shift or mass effect. The ventricles and sulci are normal for patient's age. Mild patchy supratentorial white matter FLAIR T2 hyperintensities exclusive of the aforementioned vasogenic edema compatible with chronic small vessel ischemic disease. Normal major intracranial vascular flow voids seen at the skull base. No extra-axial masses or abnormal extra-axial enhancement. Ocular globes and orbital contents are unremarkable. Paranasal sinuses and mastoid air cells are well aerated. No abnormal sellar expansion. No cerebellar tonsillar ectopia. No suspicious calvarial bone marrow signal. IMPRESSION: Motion degraded examination degrades sensitivity for small metastatic deposits. Two enhancing masses in RIGHT frontal lobe measuring up to 2 x 1.7 cm, 1 of which corresponds to CT abnormality with imaging characteristics most consistent with metastatic disease. No significant mass effect. Involutional changes. Mild white matter changes compatible with chronic small vessel ischemic disease. Electronically Signed   By: CElon AlasM.D.   On: 08/04/2015 21:37    Chart has been reviewed  Family   at  Bedside  plan of care was discussed with   Wife and Daughter  Assessment/Plan  89year old gentleman with known history of lung cancer presents with syncope versus seizure like activity  Present on Admission:  . Syncope due to dehydration versus seizures. We'll rehydrate cycle cardiac enzymes monitor on telemetry obtain echogram and carotid Dopplers. Also obtain EEG.  . Lung cancer ([facility] with new diagnosis of metastatic spread to the brain. We'll need oncology evaluation.  ncology consult in the a.m.  .  Essential hypertension hold amlodipine but continue metoprolol much blood pressure  . CAD (coronary artery disease) currently stable continue to monitor no chest pain given diaphoresis will cycle cardiac enzymes  . Brain metastases ([place] Will initiate Keppra for possible seizure event. Will likely need radiation therapy neurosurgery has been consulted will defer to oncology if radiation oncology should be involved as well Seizure-like activity initiated [facility] will see the morning order EEG admit on seizure percussion  . AAA (abdominal aortic aneurysm) without rupture ([facility])  - chronic      Prophylaxis: SCD   CODE STATUS:  FULL CODE   as per family  Disposition:  To home once workup is complete and patient is stable  Other plan as per orders.  I have spent a total of 528m on this admission  Shailene Demonbreun 08/04/2015, 11:20 PM  [facility]  Pager [phone] after 2 AM please page floor coverage PA If 7AM-7PM, please contact the day team taking care of the patient  [email or web address]  Password [email or web address]

## 2015-08-04 NOTE — ED Notes (Addendum)
MD/ hospitalist at bedside speaking with patient and family.

## 2015-08-04 NOTE — ED Provider Notes (Signed)
CSN: [id number]     Arrival date & time 08/04/15  1740 History   First MD Initiated Contact with Patient 08/04/15 1745     Chief Complaint  Patient presents with  . Seizures  . Flu Shot Today      (Consider location/radiation/quality/duration/timing/severity/associated sxs/prior Treatment) HPI Patient had a flu shot in the morning. He reports prior to the flu shot he felt well. He states after that he developed some aching in his left shoulder. Then somewhat later he developed some chills and sweats and recurrent vomiting. He did not have chest pain or shortness of breath. He however did feel very poorly and while lying in bed reportedly had an episode of his arms shaking and up over his head with his eyes rolled back. His wife walked in on this. She estimates this lasted about 5 seconds. After it had resolved the patient fairly quickly got back to normal mental status. He does not remember that actual event. He murmurs feeling very bad leading up to it but does not recall having any episode of passing out. He does not have a seizure history. Patient does have a history of lung cancer that has been treated. Family members advised that he had a PET scan recently that showed an be clear of any metastatic disease or residual lung cancer however he was supposed to get a follow-up MRI to rule out brain metastases and he did not get that done. Past Medical History  Diagnosis Date  . Hyperlipidemia   . Diverticulosis   . Coronary artery disease     MI 1992, S/P  PTCA; negative stress test in November 2011 with no ischemia.   . Peripheral vascular disease ([place]) 1/14    4.8x4.6 cm infrarenal abdominal aortic fusiform aneurysm, 1.5 cm right common iliac artery aneurysm  . AAA (abdominal aortic aneurysm) ([place])   . Hearing loss     Bilateral   . Myocardial infarction [facility]) 1992    Dr Angelena Form    . History of radiation therapy 11/10/13-12/12/13    lung,50Gy/64f  . [place]    non-small  cell lung cancer  . Hypertension   . Shortness of breath    Past Surgical History  Procedure Laterality Date  . Ptca  1992  . Inguinal heriiorrhaphy bilaterally    . Colonoscopy w/ polypectomy  2003     negative 2010,due 2020; Dr BOlevia Perches . Pilonidal cyst excision    . Rotator cuff repair      Bilateral  . Cystoscopy/retrograde/ureteroscopy Bilateral 10/09/2012    Procedure: BILATERAL RETROGRADE bladder and urethral BIOPSY ;  Surgeon: DMolli Hazard MD;  Location: [facility];  Service: Urology;  Laterality: Bilateral;  BILATERAL RETROGRADE  AND bladder and urethral BIOPSY    . Prostate biopsy N/A 10/09/2012    Procedure: PROSTATIC URETHRAL BIOPSY;  Surgeon: DMolli Hazard MD;  Location: [facility];  Service: Urology;  Laterality: N/A;  PROSTATIC URETHRAL BIOPSY    . Hernia repair Bilateral     Inguinal  . Mediastinoscopy N/A 05/01/2013    Procedure: MEDIASTINOSCOPY;  Surgeon: BGaye Pollack MD;  [facility];  Service: Thoracic;  Laterality: N/A;  . Video bronchoscopy N/A 05/01/2013    Procedure: VIDEO BRONCHOSCOPY;  Surgeon: BGaye Pollack MD;  [facility];  Service: Thoracic;  Laterality: N/A;  . Coronary angioplasty      no stents  . Thorocotomy with lobectomy Left 05/29/2013    Procedure: LEFT THOROCOTOMY WITH LEFT  UPPER LOBE LOBECTOMY;  Surgeon: Gaye Pollack, MD;  Location: [facility];  Service: Thoracic;  Laterality: Left;  . Abdominal aortic endovascular stent graft N/A 05/07/2014    Procedure: ABDOMINAL AORTIC ENDOVASCULAR STENT GRAFT;  Surgeon: Serafina Mitchell, MD;  Location: [facility];  Service: Vascular;  Laterality: N/A;   Family History  Problem Relation Age of Onset  . Hypertension Mother   . Prostate cancer Father     Prostate cancer  . Cancer Father     Brain Tumor  . Lung cancer Sister     NON SMOKER  . Cancer Sister   . Hyperlipidemia Sister   . Diabetes Neg Hx   . Stroke Neg Hx   . Hyperlipidemia Brother   . Heart attack Brother   . Hypertension  Brother    Social History  Substance Use Topics  . Smoking status: Current Every Day Smoker -- 1.00 packs/day for 24 years    Types: Cigarettes, Cigars    Last Attempt to Quit: 05/28/2013  . Smokeless tobacco: Former Systems developer    Quit date: 05/21/2013     Comment: QUIT 15 YEARS AGO  . Alcohol Use: 9.0 oz/week    3 Glasses of wine, 12 Cans of beer per week     Comment: 5-6 beers a day    Review of Systems 10 Systems reviewed and are negative for acute change except as noted in the HPI.    Allergies  Fluvirin and Iodinated diagnostic agents  Home Medications   Prior to Admission medications   Medication Sig Start Date End Date Taking? Authorizing Provider  amLODipine (NORVASC) 5 MG tablet Take 1 tablet (5 mg total) by mouth daily. 04/12/15  Yes Hendricks Limes, MD  aspirin EC 81 MG tablet Take 81 mg by mouth daily.   Yes Historical Provider, MD  budesonide-formoterol (SYMBICORT) 160-4.5 MCG/ACT inhaler Inhale 1-2 puffs into the lungs every 12 (twelve) hours. Gargle and spit after use. 06/07/15  Yes Hendricks Limes, MD  CRESTOR 40 MG tablet TAKE 1 TABLET (40 MG TOTAL) BY MOUTH DAILY. 05/24/15  Yes Hendricks Limes, MD  Glucosamine-Chondroit-Vit C-Mn (GLUCOSAMINE 1500 COMPLEX PO) Take 1,500 mg by mouth daily.    Yes Historical Provider, MD  metoprolol (LOPRESSOR) 50 MG tablet Take 1 tablet (50 mg total) by mouth 2 (two) times daily. --- Pt needs appt for further refills. 07/14/15  Yes Hendricks Limes, MD  Multiple Vitamin (MULTIVITAMIN) tablet Take 1 tablet by mouth every evening.    Yes Historical Provider, MD   BP 124/69 mmHg  Pulse 73  Temp(Src) 98 F (36.7 C) (Oral)  Resp 18  Ht '5\' 4"'$  (1.626 m)  Wt 142 lb 3.2 oz (64.5 kg)  BMI 24.40 kg/m2  SpO2 95% Physical Exam  Constitutional: He is oriented to person, place, and time. He appears well-developed and well-nourished.  HENT:  Head: Normocephalic and atraumatic.  Eyes: EOM are normal. Pupils are equal, round, and reactive to  light.  Neck: Neck supple.  Cardiovascular: Normal rate, regular rhythm, normal heart sounds and intact distal pulses.   Pulmonary/Chest: Effort normal.  Left lung field has transmitted breath sounds. And has no respiratory distress.  Abdominal: Soft. Bowel sounds are normal. He exhibits no distension. There is no tenderness.  Musculoskeletal: Normal range of motion. He exhibits no edema.  Neurological: He is alert and oriented to person, place, and time. He has normal strength. No cranial nerve deficit. He exhibits normal muscle tone. Coordination normal. GCS  eye subscore is 4. GCS verbal subscore is 5. GCS motor subscore is 6.  Skin: Skin is warm, dry and intact.  Psychiatric: He has a normal mood and affect.    ED Course  Procedures (including critical care time) Labs Review Labs Reviewed  COMPREHENSIVE METABOLIC PANEL - Abnormal; Notable for the following:    Glucose, Bld 104 (*)    All other components within normal limits  CBC WITH DIFFERENTIAL/PLATELET - Abnormal; Notable for the following:    WBC 17.7 (*)    Neutro Abs 16.5 (*)    All other components within normal limits  COMPREHENSIVE METABOLIC PANEL - Abnormal; Notable for the following:    Glucose, Bld 103 (*)    Calcium 8.7 (*)    Total Protein 6.3 (*)    All other components within normal limits  CBC - Abnormal; Notable for the following:    Hemoglobin 12.8 (*)    HCT 37.5 (*)    All other components within normal limits  URINE CULTURE  LIPASE, BLOOD  TROPONIN I  MAGNESIUM  PHOSPHORUS  TSH  TROPONIN I  TROPONIN I  TROPONIN I  URINALYSIS, ROUTINE W REFLEX MICROSCOPIC (NOT AT [facility])  LACTIC ACID, PLASMA    Imaging Review Dg Chest 2 View  08/04/2015  CLINICAL DATA:  Seizure-like episode. Lung cancer. Hypertension. Ex-smoker. EXAM: CHEST  2 VIEW COMPARISON:  10/23/2014 and PET of 07/02/2015 FINDINGS: Left rotator cuff repair. Midline trachea. Mild cardiomegaly. Surgical clips in the left hilum with volume loss  in the left hemithorax and elevation of the left hemidiaphragm. No pleural effusion or pneumothorax. Mild scarring at the left lung base. Aortic endograft repair. IMPRESSION: Similar appearance of surgical changes in the left hemi thorax. No acute superimposed process. Electronically Signed   By: Abigail Miyamoto M.D.   On: 08/04/2015 19:41   Ct Head Wo Contrast  08/04/2015  CLINICAL DATA:  Seizure-like episode.  The onset was unwitnessed. EXAM: CT HEAD WITHOUT CONTRAST TECHNIQUE: Contiguous axial images were obtained from the base of the skull through the vertex without intravenous contrast. COMPARISON:  MRI brain 10/17/2013. FINDINGS: A 1.6 x 1.5 cm hyperdense mass lesion is present in the high right frontal lobe with surrounding vasogenic edema. No other focal mass lesions are present. There are no other focal areas of edema. No other acute infarct is present. There is no acute hemorrhage. The ventricles are of normal size. No significant extra-axial fluid collection is present. The paranasal sinuses and mastoid air cells are clear. Calvarium is intact. No significant extracranial soft tissue lesions are present. IMPRESSION: 1. 1.6 x 1.5 cm hyperdense mass lesion in the high right frontal lobe is most concerning for a focal metastasis. 2. No other focal lesions are evident. Recommend MRI the brain without and with contrast for further evaluation of metastatic disease. These results were called by telephone at the time of interpretation on 08/04/2015 at 7:47 pm to Dr. Charlesetta Shanks , who verbally acknowledged these results. Electronically Signed   By: San Morelle M.D.   On: 08/04/2015 19:48   Mr Jeri Cos MW Contrast  08/04/2015  CLINICAL DATA:  Flu shot at 9 a.m., vomiting beginning at 3 p.m. drank beer. Sudden onset seizure like symptoms. EXAM: MRI HEAD WITHOUT AND WITH CONTRAST TECHNIQUE: Multiplanar, multiecho pulse sequences of the brain and surrounding structures were obtained without and with  intravenous contrast. CONTRAST:  105m MULTIHANCE GADOBENATE DIMEGLUMINE 529 MG/ML IV SOLN COMPARISON:  CT head August 04, 2015 at 1939  hours FINDINGS: Multiple sequences are mildly more moderately motion degraded. RIGHT posterior frontal 2 x 1.7 cm rounded mass in the precentral gyrus demonstrates thin smooth peripheral enhancement. No associated susceptibility artifact to suggest hemorrhage, no reduced diffusion to suggest hypercellular tumor. A second T2 bright 11 x 7 mm mass within the RIGHT posterior frontal gray-white matter junction with similar peripheral enhancement. Small area of subtle FLAIR T2 hyperintense signal in the LEFT temporal cortex without enhancement favors artifact, less likely nonenhancing tiny metastatic deposit. No midline shift or mass effect. The ventricles and sulci are normal for patient's age. Mild patchy supratentorial white matter FLAIR T2 hyperintensities exclusive of the aforementioned vasogenic edema compatible with chronic small vessel ischemic disease. Normal major intracranial vascular flow voids seen at the skull base. No extra-axial masses or abnormal extra-axial enhancement. Ocular globes and orbital contents are unremarkable. Paranasal sinuses and mastoid air cells are well aerated. No abnormal sellar expansion. No cerebellar tonsillar ectopia. No suspicious calvarial bone marrow signal. IMPRESSION: Motion degraded examination degrades sensitivity for small metastatic deposits. Two enhancing masses in RIGHT frontal lobe measuring up to 2 x 1.7 cm, 1 of which corresponds to CT abnormality with imaging characteristics most consistent with metastatic disease. No significant mass effect. Involutional changes. Mild white matter changes compatible with chronic small vessel ischemic disease. Electronically Signed   By: Elon Alas M.D.   On: 08/04/2015 21:37   I have personally reviewed and evaluated these images and lab results as part of my medical decision-making.    EKG Interpretation   Date/Time:  Wednesday August 04 2015 18:25:54 EST Ventricular Rate:  101 PR Interval:  186 QRS Duration: 88 QT Interval:  356 QTC Calculation: 461 R Axis:   -38 Text Interpretation:  Sinus tachycardia Left axis deviation Probable  anteroseptal infarct, old ST elevation, consider inferior injury no sig  change c/w old Confirmed by Johnney Killian, MD, Jeannie Done [phone]) on 08/04/2015  6:35:10 PM     Consult: Patient's case was reviewed with Dr. Sherwood Gambler. He did not advise to initiate Decadron at this time. He does advise he will work in conjunction with radiation oncology to plan treatment of the patient's brain metastases. Consultation: Patient's case is reviewed with neurology, Dr. Nicole Kindred. He advises to initiate Keppra 1 g IV load and then 500 twice a day. Consultation: Internal medicine patient's case is reviewed with Dr. Darnell Level for admission with Keppra administration and monitoring MDM   Final diagnoses:  Seizure [facility])  Metastatic cancer to brain [facility])   Patient had an episode that was proceeded by chills and sweats with nausea and vomiting. He had not been ill prior to receiving his flu shot. At this time it seems most likely that the constitutional symptoms were associated with the influenza shot as they have at this point completely resolved. He no longer has chills sweats, body aches and there is no fever. He had an episode witnessed by his wife that was very suspicious for seizure-like activity. This prompted further diagnostic evaluation as the family had concerns for possible brain metastases from his prior lung cancer. Abnormality was identified on CT which was followed up by MRI. 2 metastatic lesions are identified. There is no surrounding edema or mass effect. This was reviewed with neurosurgery as well as neurology in at this point the patient will be admitted for Keppra administration and ongoing monitoring with subsequent planning with neurosurgery and radiation  oncology for management of the metastases. At the time of admission, the patient is alert and  appropriate. He shows no signs of confusion or focal neurologic deficit. He is not toxic in appearance. His constitutional symptoms appear to resolve.    Charlesetta Shanks, MD 08/05/15 [phone]

## 2015-08-04 NOTE — ED Notes (Signed)
Patient transported to MRI 

## 2015-08-04 NOTE — ED Notes (Signed)
Bed: WA06 Expected date:  Expected time:  Means of arrival:  Comments: EMS/seizure 

## 2015-08-04 NOTE — ED Notes (Signed)
Nurse drawing labs. 

## 2015-08-05 ENCOUNTER — Observation Stay (HOSPITAL_BASED_OUTPATIENT_CLINIC_OR_DEPARTMENT_OTHER): Payer: Medicare Other

## 2015-08-05 ENCOUNTER — Encounter (HOSPITAL_COMMUNITY): Payer: Self-pay

## 2015-08-05 ENCOUNTER — Observation Stay (HOSPITAL_COMMUNITY): Payer: Medicare Other

## 2015-08-05 ENCOUNTER — Observation Stay (HOSPITAL_COMMUNITY)
Admit: 2015-08-05 | Discharge: 2015-08-05 | Disposition: A | Discharge status: Home / Self Care | Payer: Medicare Other | Attending: Internal Medicine | Admitting: Internal Medicine

## 2015-08-05 DIAGNOSIS — C7931 Secondary malignant neoplasm of brain: Secondary | ICD-10-CM

## 2015-08-05 DIAGNOSIS — C3412 Malignant neoplasm of upper lobe, left bronchus or lung: Secondary | ICD-10-CM

## 2015-08-05 DIAGNOSIS — I10 Essential (primary) hypertension: Secondary | ICD-10-CM

## 2015-08-05 DIAGNOSIS — R55 Syncope and collapse: Secondary | ICD-10-CM | POA: Diagnosis present

## 2015-08-05 DIAGNOSIS — R569 Unspecified convulsions: Secondary | ICD-10-CM | POA: Diagnosis not present

## 2015-08-05 HISTORY — PX: OTHER SURGICAL HISTORY: SHX169

## 2015-08-05 LAB — URINALYSIS, ROUTINE W REFLEX MICROSCOPIC
Bilirubin Urine: NEGATIVE
Glucose, UA: NEGATIVE mg/dL
Hgb urine dipstick: NEGATIVE
Ketones, ur: NEGATIVE mg/dL
Leukocytes, UA: NEGATIVE
Nitrite: NEGATIVE
Protein, ur: NEGATIVE mg/dL
Specific Gravity, Urine: 1.012 (ref 1.005–1.030)
pH: 6.5 (ref 5.0–8.0)

## 2015-08-05 LAB — COMPREHENSIVE METABOLIC PANEL
ALT: 24 U/L (ref 17–63)
AST: 18 U/L (ref 15–41)
Albumin: 3.6 g/dL (ref 3.5–5.0)
Alkaline Phosphatase: 55 U/L (ref 38–126)
Anion gap: 8 (ref 5–15)
BUN: 11 mg/dL (ref 6–20)
CO2: 25 mmol/L (ref 22–32)
Calcium: 8.7 mg/dL — ABNORMAL LOW (ref 8.9–10.3)
Chloride: 104 mmol/L (ref 101–111)
Creatinine, Ser: 0.74 mg/dL (ref 0.61–1.24)
GFR calc Af Amer: >60 mL/min (ref >60)
GFR calc non Af Amer: >60 mL/min (ref >60)
Glucose, Bld: 103 mg/dL — ABNORMAL HIGH (ref 65–99)
Potassium: 3.7 mmol/L (ref 3.5–5.1)
Sodium: 137 mmol/L (ref 135–145)
Total Bilirubin: 0.8 mg/dL (ref 0.3–1.2)
Total Protein: 6.3 g/dL — ABNORMAL LOW (ref 6.5–8.1)

## 2015-08-05 LAB — MAGNESIUM: Magnesium: 1.9 mg/dL (ref 1.7–2.4)

## 2015-08-05 LAB — CBC
HCT: 37.5 % — ABNORMAL LOW (ref 39.0–52.0)
Hemoglobin: 12.8 g/dL — ABNORMAL LOW (ref 13.0–17.0)
MCH: 29.5 pg (ref 26.0–34.0)
MCHC: 34.1 g/dL (ref 30.0–36.0)
MCV: 86.4 fL (ref 78.0–100.0)
Platelets: 204 10*3/uL (ref 150–400)
RBC: 4.34 MIL/uL (ref 4.22–5.81)
RDW: 15 % (ref 11.5–15.5)
WBC: 9.7 10*3/uL (ref 4.0–10.5)

## 2015-08-05 LAB — PHOSPHORUS: Phosphorus: 4 mg/dL (ref 2.5–4.6)

## 2015-08-05 LAB — TROPONIN I
Troponin I: <0.03 ng/mL (ref <0.031)
Troponin I: <0.03 ng/mL (ref <0.031)
Troponin I: <0.03 ng/mL (ref <0.031)

## 2015-08-05 LAB — LACTIC ACID, PLASMA: Lactic Acid, Venous: 0.8 mmol/L (ref 0.5–2.0)

## 2015-08-05 LAB — TSH: TSH: 0.708 u[IU]/mL (ref 0.350–4.500)

## 2015-08-05 MED ORDER — SODIUM CHLORIDE 0.9 % IJ SOLN
3.0000 mL | Freq: Two times a day (BID) | INTRAMUSCULAR | Status: DC
  Administered 2015-08-05 (×2): 3 mL via INTRAVENOUS

## 2015-08-05 MED ORDER — DEXAMETHASONE SODIUM PHOSPHATE 10 MG/ML IJ SOLN
10.0000 mg | Freq: Once | INTRAMUSCULAR | Status: DC
  Filled 2015-08-05: qty 1

## 2015-08-05 MED ORDER — NICOTINE 21 MG/24HR TD PT24
21.0000 mg | MEDICATED_PATCH | Freq: Every day | TRANSDERMAL | Status: DC
  Administered 2015-08-05 – 2015-08-06 (×2): 21 mg via TRANSDERMAL
  Filled 2015-08-05 (×2): qty 1

## 2015-08-05 MED ORDER — ADULT MULTIVITAMIN W/MINERALS CH
1.0000 | ORAL_TABLET | Freq: Every day | ORAL | Status: DC
  Administered 2015-08-05 – 2015-08-06 (×2): 1 via ORAL
  Filled 2015-08-05 (×2): qty 1

## 2015-08-05 MED ORDER — ACETAMINOPHEN 325 MG PO TABS: 650.0000 mg | ORAL_TABLET | Freq: Four times a day (QID) | ORAL | Status: DC | PRN

## 2015-08-05 MED ORDER — ONDANSETRON HCL 4 MG PO TABS: 4.0000 mg | ORAL_TABLET | Freq: Four times a day (QID) | ORAL | Status: DC | PRN

## 2015-08-05 MED ORDER — LORAZEPAM 1 MG PO TABS: 1.0000 mg | ORAL_TABLET | Freq: Four times a day (QID) | ORAL | Status: DC | PRN

## 2015-08-05 MED ORDER — DEXAMETHASONE 4 MG PO TABS
4.0000 mg | ORAL_TABLET | Freq: Two times a day (BID) | ORAL | Status: DC
  Filled 2015-08-05: qty 1

## 2015-08-05 MED ORDER — ASPIRIN EC 81 MG PO TBEC
81.0000 mg | DELAYED_RELEASE_TABLET | Freq: Every day | ORAL | Status: DC
  Administered 2015-08-05 – 2015-08-06 (×2): 81 mg via ORAL
  Filled 2015-08-05 (×2): qty 1

## 2015-08-05 MED ORDER — HYDROCODONE-ACETAMINOPHEN 5-325 MG PO TABS: 1.0000 | ORAL_TABLET | ORAL | Status: DC | PRN

## 2015-08-05 MED ORDER — METOPROLOL TARTRATE 50 MG PO TABS
50.0000 mg | ORAL_TABLET | Freq: Two times a day (BID) | ORAL | Status: DC
  Administered 2015-08-05 – 2015-08-06 (×4): 50 mg via ORAL
  Filled 2015-08-05 (×4): qty 1

## 2015-08-05 MED ORDER — ENOXAPARIN SODIUM 40 MG/0.4ML ~~LOC~~ SOLN
40.0000 mg | SUBCUTANEOUS | Status: DC
  Administered 2015-08-05 – 2015-08-06 (×2): 40 mg via SUBCUTANEOUS
  Filled 2015-08-05 (×2): qty 0.4

## 2015-08-05 MED ORDER — BUDESONIDE-FORMOTEROL FUMARATE 160-4.5 MCG/ACT IN AERO
1.0000 | INHALATION_SPRAY | Freq: Two times a day (BID) | RESPIRATORY_TRACT | Status: DC
  Administered 2015-08-05: 2 via RESPIRATORY_TRACT
  Administered 2015-08-05: 1 via RESPIRATORY_TRACT
  Administered 2015-08-06: 2 via RESPIRATORY_TRACT
  Filled 2015-08-05: qty 6

## 2015-08-05 MED ORDER — GUAIFENESIN ER 600 MG PO TB12
600.0000 mg | ORAL_TABLET | Freq: Two times a day (BID) | ORAL | Status: DC
  Administered 2015-08-05 – 2015-08-06 (×4): 600 mg via ORAL
  Filled 2015-08-05 (×4): qty 1

## 2015-08-05 MED ORDER — LEVETIRACETAM 500 MG PO TABS
500.0000 mg | ORAL_TABLET | Freq: Two times a day (BID) | ORAL | Status: DC
  Administered 2015-08-05 – 2015-08-06 (×4): 500 mg via ORAL
  Filled 2015-08-05 (×4): qty 1

## 2015-08-05 MED ORDER — ACETAMINOPHEN 650 MG RE SUPP: 650.0000 mg | Freq: Four times a day (QID) | RECTAL | Status: DC | PRN

## 2015-08-05 MED ORDER — FOLIC ACID 1 MG PO TABS
1.0000 mg | ORAL_TABLET | Freq: Every day | ORAL | Status: DC
  Administered 2015-08-05 – 2015-08-06 (×2): 1 mg via ORAL
  Filled 2015-08-05 (×2): qty 1

## 2015-08-05 MED ORDER — SODIUM CHLORIDE 0.9 % IV SOLN
INTRAVENOUS | Status: DC
  Administered 2015-08-05: 02:00:00 via INTRAVENOUS

## 2015-08-05 MED ORDER — THIAMINE HCL 100 MG/ML IJ SOLN: 100.0000 mg | Freq: Every day | INTRAMUSCULAR | Status: DC

## 2015-08-05 MED ORDER — ROSUVASTATIN CALCIUM 20 MG PO TABS
40.0000 mg | ORAL_TABLET | Freq: Every day | ORAL | Status: DC
  Administered 2015-08-05: 40 mg via ORAL
  Filled 2015-08-05: qty 2

## 2015-08-05 MED ORDER — ALBUTEROL SULFATE (2.5 MG/3ML) 0.083% IN NEBU: 2.5000 mg | INHALATION_SOLUTION | RESPIRATORY_TRACT | Status: DC | PRN

## 2015-08-05 MED ORDER — LORAZEPAM 2 MG/ML IJ SOLN: 1.0000 mg | Freq: Four times a day (QID) | INTRAMUSCULAR | Status: DC | PRN

## 2015-08-05 MED ORDER — ONDANSETRON HCL 4 MG/2ML IJ SOLN: 4.0000 mg | Freq: Four times a day (QID) | INTRAMUSCULAR | Status: DC | PRN

## 2015-08-05 MED ORDER — VITAMIN B-1 100 MG PO TABS
100.0000 mg | ORAL_TABLET | Freq: Every day | ORAL | Status: DC
  Administered 2015-08-05 – 2015-08-06 (×2): 100 mg via ORAL
  Filled 2015-08-05 (×2): qty 1

## 2015-08-05 NOTE — Consult Note (Addendum)
Neurology Consultation Reason for Consult: Stroke Referring Physician: Princella Ion  CC: stroke  History is obtained from:patient  HPI: Jordan Mccullough is a 74 y.o. male with a history of lung cancer who presents with transient episode of unusual symptoms yesterday. He describes blurred vision bilaterally and feeling of lightheadedness. He does not feel that he ever lost consciousness, but does not remember shaking that his wife describes.   He had an MRI which demonstrated cerebral mets.   ROS: A 14 point ROS was performed and is negative except as noted in the HPI.   Past Medical History  Diagnosis Date  . Hyperlipidemia   . Diverticulosis   . Coronary artery disease     MI 1992, S/P  PTCA; negative stress test in November 2011 with no ischemia.   . Peripheral vascular disease ([place]) 1/14    4.8x4.6 cm infrarenal abdominal aortic fusiform aneurysm, 1.5 cm right common iliac artery aneurysm  . AAA (abdominal aortic aneurysm) ([place])   . Hearing loss     Bilateral   . Myocardial infarction [facility]) 1992    Dr Angelena Form    . History of radiation therapy 11/10/13-12/12/13    lung,50Gy/55f  . [place]    non-small cell lung cancer  . Hypertension   . Shortness of breath      Family History  Problem Relation Age of Onset  . Hypertension Mother   . Prostate cancer Father     Prostate cancer  . Cancer Father     Brain Tumor  . Lung cancer Sister     NON SMOKER  . Cancer Sister   . Hyperlipidemia Sister   . Diabetes Neg Hx   . Stroke Neg Hx   . Hyperlipidemia Brother   . Heart attack Brother   . Hypertension Brother      Social History:  reports that he has been smoking Cigarettes and Cigars.  He has a 24 pack-year smoking history. He quit smokeless tobacco use about 2 years ago. He reports that he drinks about 9.0 oz of alcohol per week. He reports that he does not use illicit drugs.   Exam: Current vital signs: BP 121/65 mmHg  Pulse 76  Temp(Src) 98.2 F  (36.8 C) (Oral)  Resp 18  Ht '5\' 4"'$  (1.626 m)  Wt 64.5 kg (142 lb 3.2 oz)  BMI 24.40 kg/m2  SpO2 95% Vital signs in last 24 hours: Temp:  [98 F (36.7 C)-98.2 F (36.8 C)] 98.2 F (36.8 C) (12/15 0612) Pulse Rate:  [76-98] 76 (12/15 0612) Resp:  [16-23] 18 (12/15 0612) BP: (107-135)/(64-85) 121/65 mmHg (12/15 0612) SpO2:  [93 %-95 %] 95 % (12/15 0612) Weight:  [64.5 kg (142 lb 3.2 oz)-66.225 kg (146 lb)] 64.5 kg (142 lb 3.2 oz) (12/15 0105)   Physical Exam  Constitutional: Appears well-developed and well-nourished.  Psych: Affect appropriate to situation Eyes: No scleral injection HENT: No OP obstrucion Head: Normocephalic.  Cardiovascular: Normal rate and regular rhythm.  Respiratory: Effort normal and breath sounds normal to anterior ascultation GI: Soft.  No distension. There is no tenderness.  Skin: WDI  Neuro: Mental Status: Patient is awake, alert, oriented to person, place, month, year, and situation. Patient is able to give a clear and coherent history. No signs of aphasia or neglect Cranial Nerves: II: Visual Fields are full. Pupils are equal, round, and reactive to light.   III,IV, VI: EOMI without ptosis or diploplia.  V: Facial sensation is symmetric to temperature  VII: Facial movement is symmetric.  VIII: hearing is intact to voice X: Uvula elevates symmetrically XI: Shoulder shrug is symmetric. XII: tongue is midline without atrophy or fasciculations.  Motor: Tone is normal. Bulk is normal. 5/5 strength was present in all four extremities.  Sensory: Sensation is symmetric to light touch and temperature in the arms and legs. Plantars: Toes are downgoing bilaterally.  Cerebellar: FNF intact bilaterally  I have reviewed labs in epic and the results pertinent to this consultation are: cmp - unremarkable  I have reviewed the images obtained: MRI brain - CNS mets  Impression: 74 yo M with CNS mets and episode concerning for seizure. I do favor treating  this as seizure. He will need treatment of his brain mets and nsgy/oncology have been consulted.   Recommendations: 1) Keppra '500mg'$  BID 2) Agree with neurosurgery/rad onc consultation.  3) I have discussed with the patient that he is not allowed to drive or fly a plane for 6 months from the most recent seizure.    Roland Rack, MD [facility] [phone]  If 7pm- 7am, please page neurology on call as listed in [place].

## 2015-08-05 NOTE — Progress Notes (Signed)
EEG completed; results pending.    

## 2015-08-05 NOTE — Procedures (Signed)
History: 74 yo M with seizure and metastatic tumor  Sedation: None  On Keppra.  Technique: This is a 21 channel routine scalp EEG performed at the bedside with bipolar and monopolar montages arranged in accordance to the international 10/20 system of electrode placement. One channel was dedicated to EKG recording.    Background: The background consists of intermixed alpha and beta activities. There is a well defined posterior dominant rhythm of 8 Hz that attenuates with eye opening. Sleep is recorded with normal appearing structures.   Photic stimulation: Physiologic driving is not performed  EEG Abnormalities: None  Clinical Interpretation: This normal EEG is recorded in the waking and sleep state. There was no seizure or seizure predisposition recorded on this study. Please note that a normal EEG does not preclude the possibility of epilepsy.   Jordan Rack, MD [facility] [phone]  If 7pm- 7am, please page neurology on call as listed in [place].

## 2015-08-05 NOTE — Progress Notes (Signed)
Echocardiogram 2D Echocardiogram has been performed.  Jordan Mccullough 08/05/2015, 12:06 PM

## 2015-08-05 NOTE — Progress Notes (Signed)
PROGRESS NOTE  Jordan Mccullough QBH:[id number] DOB: [birth year] DOA: 08/04/2015 PCP: [name], MD  Assessment/Plan: Seizures. -EEG.   Lung cancer ([place]) with new diagnosis of metastatic spread to the brain -keppra -neurosurgery to see -radiation oncology consult  Essential hypertension hold amlodipine but continue metoprolol much blood pressure   CAD (coronary artery disease) currently stable continue to monitor no chest pain given diaphoresis will cycle cardiac enzymes   AAA (abdominal aortic aneurysm) without rupture (HCC) - chronic   Tobacco abuse -nicotine patch  Code Status: full Family Communication:  Disposition Plan:    Consultants:    Procedures:      HPI/Subjective: Wants to leave  Objective: Filed Vitals:   08/05/15 0105 08/05/15 0612  BP: 111/64 121/65  Pulse: 86 76  Temp: 98 F (36.7 C) 98.2 F (36.8 C)  Resp: 20 18    Intake/Output Summary (Last 24 hours) at 08/05/15 1137 Last data filed at 08/05/15 0612  Gross per 24 hour  Intake      0 ml  Output   1125 ml  Net  -1125 ml   Filed Weights   08/05/15 0105  Weight: 64.5 kg (142 lb 3.2 oz)    Exam:   General:  Awake, NAD  Cardiovascular: rrr  Respiratory: clear  Abdomen: +BS, soft  Musculoskeletal: no edema   Data Reviewed: Basic Metabolic Panel:  Recent Labs Lab 08/04/15 1837 08/05/15 0145  NA 138 137  K 4.2 3.7  CL 102 104  CO2 25 25  GLUCOSE 104* 103*  BUN 13 11  CREATININE 0.84 0.74  CALCIUM 9.4 8.7*  MG  --  1.9  PHOS  --  4.0   Liver Function Tests:  Recent Labs Lab 08/04/15 1837 08/05/15 0145  AST 23 18  ALT 28 24  ALKPHOS 62 55  BILITOT 0.7 0.8  PROT 7.4 6.3*  ALBUMIN 4.2 3.6    Recent Labs Lab 08/04/15 1837  LIPASE 40   No results for input(s): AMMONIA in the last 168 hours. CBC:  Recent Labs Lab 08/04/15 1837 08/05/15 0145  WBC 17.7* 9.7  NEUTROABS 16.5*  --   HGB 15.0 12.8*  HCT 43.8 37.5*  MCV 86.4 86.4  PLT 213 204     Cardiac Enzymes:  Recent Labs Lab 08/04/15 1837 08/05/15 0145 08/05/15 0759  TROPONINI <0.03 <0.03 <0.03   BNP (last 3 results) No results for input(s): BNP in the last 8760 hours.  ProBNP (last 3 results) No results for input(s): PROBNP in the last 8760 hours.  CBG: No results for input(s): GLUCAP in the last 168 hours.  No results found for this or any previous visit (from the past 240 hour(s)).   Studies: Dg Chest 2 View  08/04/2015  CLINICAL DATA:  Seizure-like episode. Lung cancer. Hypertension. Ex-smoker. EXAM: CHEST  2 VIEW COMPARISON:  10/23/2014 and PET of 07/02/2015 FINDINGS: Left rotator cuff repair. Midline trachea. Mild cardiomegaly. Surgical clips in the left hilum with volume loss in the left hemithorax and elevation of the left hemidiaphragm. No pleural effusion or pneumothorax. Mild scarring at the left lung base. Aortic endograft repair. IMPRESSION: Similar appearance of surgical changes in the left hemi thorax. No acute superimposed process. Electronically Signed   By: Abigail Miyamoto M.D.   On: 08/04/2015 19:41   Ct Head Wo Contrast  08/04/2015  CLINICAL DATA:  Seizure-like episode.  The onset was unwitnessed. EXAM: CT HEAD WITHOUT CONTRAST TECHNIQUE: Contiguous axial images were obtained from the base of the skull  through the vertex without intravenous contrast. COMPARISON:  MRI brain 10/17/2013. FINDINGS: A 1.6 x 1.5 cm hyperdense mass lesion is present in the high right frontal lobe with surrounding vasogenic edema. No other focal mass lesions are present. There are no other focal areas of edema. No other acute infarct is present. There is no acute hemorrhage. The ventricles are of normal size. No significant extra-axial fluid collection is present. The paranasal sinuses and mastoid air cells are clear. Calvarium is intact. No significant extracranial soft tissue lesions are present. IMPRESSION: 1. 1.6 x 1.5 cm hyperdense mass lesion in the high right frontal lobe  is most concerning for a focal metastasis. 2. No other focal lesions are evident. Recommend MRI the brain without and with contrast for further evaluation of metastatic disease. These results were called by telephone at the time of interpretation on 08/04/2015 at 7:47 pm to Dr. Charlesetta Shanks , who verbally acknowledged these results. Electronically Signed   By: San Morelle M.D.   On: 08/04/2015 19:48   Mr Jeri Cos YH Contrast  08/04/2015  CLINICAL DATA:  Flu shot at 9 a.m., vomiting beginning at 3 p.m. drank beer. Sudden onset seizure like symptoms. EXAM: MRI HEAD WITHOUT AND WITH CONTRAST TECHNIQUE: Multiplanar, multiecho pulse sequences of the brain and surrounding structures were obtained without and with intravenous contrast. CONTRAST:  3m MULTIHANCE GADOBENATE DIMEGLUMINE 529 MG/ML IV SOLN COMPARISON:  CT head August 04, 2015 at 1939 hours FINDINGS: Multiple sequences are mildly more moderately motion degraded. RIGHT posterior frontal 2 x 1.7 cm rounded mass in the precentral gyrus demonstrates thin smooth peripheral enhancement. No associated susceptibility artifact to suggest hemorrhage, no reduced diffusion to suggest hypercellular tumor. A second T2 bright 11 x 7 mm mass within the RIGHT posterior frontal gray-white matter junction with similar peripheral enhancement. Small area of subtle FLAIR T2 hyperintense signal in the LEFT temporal cortex without enhancement favors artifact, less likely nonenhancing tiny metastatic deposit. No midline shift or mass effect. The ventricles and sulci are normal for patient's age. Mild patchy supratentorial white matter FLAIR T2 hyperintensities exclusive of the aforementioned vasogenic edema compatible with chronic small vessel ischemic disease. Normal major intracranial vascular flow voids seen at the skull base. No extra-axial masses or abnormal extra-axial enhancement. Ocular globes and orbital contents are unremarkable. Paranasal sinuses and mastoid  air cells are well aerated. No abnormal sellar expansion. No cerebellar tonsillar ectopia. No suspicious calvarial bone marrow signal. IMPRESSION: Motion degraded examination degrades sensitivity for small metastatic deposits. Two enhancing masses in RIGHT frontal lobe measuring up to 2 x 1.7 cm, 1 of which corresponds to CT abnormality with imaging characteristics most consistent with metastatic disease. No significant mass effect. Involutional changes. Mild white matter changes compatible with chronic small vessel ischemic disease. Electronically Signed   By: CElon AlasM.D.   On: 08/04/2015 21:37    Scheduled Meds: . aspirin EC  81 mg Oral Daily  . budesonide-formoterol  1-2 puff Inhalation Q12H  . enoxaparin (LOVENOX) injection  40 mg Subcutaneous Q24H  . folic acid  1 mg Oral Daily  . guaiFENesin  600 mg Oral BID  . levETIRAcetam  500 mg Oral BID  . metoprolol  50 mg Oral BID  . multivitamin with minerals  1 tablet Oral Daily  . nicotine  21 mg Transdermal Daily  . rosuvastatin  40 mg Oral Daily  . sodium chloride  3 mL Intravenous Q12H  . thiamine  100 mg Oral Daily   Or  .  thiamine  100 mg Intravenous Daily   Continuous Infusions:  Antibiotics Given (last 72 hours)    None      Active Problems:   CAD (coronary artery disease)   Lung cancer (HCC)   Essential hypertension   AAA (abdominal aortic aneurysm) without rupture (HCC)   Seizure-like activity ([place])   Brain metastases ([place])   Seizure ([place])   Syncope    Time spent: 25 min    [facility] Pager [phone]. If 7PM-7AM, please contact night-coverage at [email or web address], password [place] 08/05/2015, 11:37 AM

## 2015-08-05 NOTE — Progress Notes (Signed)
VASCULAR LAB PRELIMINARY  PRELIMINARY  PRELIMINARY  PRELIMINARY  Carotid duplex completed.    Preliminary report:  Right - 1% to 39% ICA stenosis lowest end of scale due to minute focal plaque. Left - No evidence of significant ICA stenosis. Bilateral - Vertebral artery flow is antegrade.  Leston Schueller, RVS 08/05/2015, 9:39 AM

## 2015-08-05 NOTE — Consult Note (Signed)
Reason for Consult:  Brain metastases Referring Physician:  Charlesetta Shanks, MD  Jordan Mccullough is an 74 y.o. right-handed white male.  HPI: Patient presented last night to the [facility] emergency room after having had a probable seizure at home yesterday. Patient evaluated by Dr. Johnney Killian (EDP) with CT and MRI the brain, which revealed two ring-enhancing brain masses consistent with metastatic disease from his lung cancer diagnosed in 2014.   A left upper lobe mass was found incidentally on a CT of the C/A/P done to evaluate vascular disease. He underwent bronchoscopy in September 2014, and subsequently thoracotomy for a left upper lobectomy in October 2014 by Dr. Gilford Raid.  The patient has been under the care of Dr. [name] from medical oncology and has been treated with chemotherapy, begun in November 2014. He underwent radiation therapy to the chest under the direction of Dr. Kyung Rudd from radiation oncology, completed in April 2015. Patient seen by Dr. Inda Merlin 3 weeks ago after PET scan, which he felt showed no evidence for disease recurrence.  Symptomatically the patient denies any headaches, double vision, blurred vision, weakness, or further seizure activity. he has been seen in medical neurology consultation by Dr. Kathrynn Speed, and has been started on Keppra 500 mg by mouth twice a day. Neurosurgical consultation has been requested, and the patient is seen for such. In speaking with Dr. Johnney Killian, I did not feel that Decadron needed to be initiated. I did recommend medical oncology reconsultation with Dr. Julien Nordmann and radiation oncology reconsultation with Dr. Lisbeth Renshaw. I anticipated recommending stereotactic radiosurgery for these two brain metastases, and therefore contacted Mont Dutton, the stereotactic radiosurgery navigator in the radiation oncology department, and asked for Dr. Ida Rogue consultation.   Past Medical History:  Past Medical History   Diagnosis Date  . Hyperlipidemia   . Diverticulosis   . Coronary artery disease     MI 1992, S/P  PTCA; negative stress test in November 2011 with no ischemia.   . Peripheral vascular disease ([place]) 1/14    4.8x4.6 cm infrarenal abdominal aortic fusiform aneurysm, 1.5 cm right common iliac artery aneurysm  . AAA (abdominal aortic aneurysm) ([place])   . Hearing loss     Bilateral   . Myocardial infarction [facility]) 1992    Dr Angelena Form    . History of radiation therapy 11/10/13-12/12/13    lung,50Gy/44f  . [place] [place]    non-small cell lung cancer  . Hypertension   . Shortness of breath     Past Surgical History:  Past Surgical History  Procedure Laterality Date  . Ptca  1992  . Inguinal heriiorrhaphy bilaterally    . Colonoscopy w/ polypectomy  2003     negative 2010,due 2020; Dr BOlevia Perches . Pilonidal cyst excision    . Rotator cuff repair      Bilateral  . Cystoscopy/retrograde/ureteroscopy Bilateral 10/09/2012    Procedure: BILATERAL RETROGRADE bladder and urethral BIOPSY ;  Surgeon: DMolli Hazard MD;  Location: [facility];  Service: Urology;  Laterality: Bilateral;  BILATERAL RETROGRADE  AND bladder and urethral BIOPSY    . Prostate biopsy N/A 10/09/2012    Procedure: PROSTATIC URETHRAL BIOPSY;  Surgeon: DMolli Hazard MD;  Location: [facility];  Service: Urology;  Laterality: N/A;  PROSTATIC URETHRAL BIOPSY    . Hernia repair Bilateral     Inguinal  . Mediastinoscopy N/A 05/01/2013    Procedure: MEDIASTINOSCOPY;  Surgeon: BGaye Pollack MD;  [facility]  Service: Thoracic;  Laterality: N/A;  . Video bronchoscopy N/A 05/01/2013    Procedure: VIDEO BRONCHOSCOPY;  Surgeon: Gaye Pollack, MD;  Location: [facility] OR;  Service: Thoracic;  Laterality: N/A;  . Coronary angioplasty      no stents  . Thorocotomy with lobectomy Left 05/29/2013    Procedure: LEFT THOROCOTOMY WITH LEFT UPPER LOBE LOBECTOMY;  Surgeon: Gaye Pollack, MD;  Location: [facility] [facility] OR;  Service:  Thoracic;  Laterality: Left;  . Abdominal aortic endovascular stent graft N/A 05/07/2014    Procedure: ABDOMINAL AORTIC ENDOVASCULAR STENT GRAFT;  Surgeon: Serafina Mitchell, MD;  Location: [facility] OR;  Service: Vascular;  Laterality: N/A;    Family History:  Family History  Problem Relation Age of Onset  . Hypertension Mother   . Prostate cancer Father     Prostate cancer  . Cancer Father     Brain Tumor  . Lung cancer Sister     NON SMOKER  . Cancer Sister   . Hyperlipidemia Sister   . Diabetes Neg Hx   . Stroke Neg Hx   . Hyperlipidemia Brother   . Heart attack Brother   . Hypertension Brother     Social History:  reports that he has been smoking Cigarettes and Cigars.  He has a 24 pack-year smoking history. He quit smokeless tobacco use about 2 years ago. He reports that he drinks about 9.0 oz of alcohol per week. He reports that he does not use illicit drugs.  Allergies:  Allergies  Allergen Reactions  . Fluvirin [Influenza Vac Typ A&B Surf Ant] Nausea And Vomiting and Other (See Comments)    Seizure like activity?  Marland Kitchen Iodinated Diagnostic Agents Hives    1 hive on lt cheek lasting approximately 1 hour on last 2 CT per pt; needs pre meds in future; 50 mg benadryl po 1 hr prior to exam per Dr. Weber Cooks    Medications: I have reviewed the patient's current medications.  ROS:  Notable for those difficulties described in the history of present illness and past mental history, but is otherwise unremarkable.   Physical Examination: Well-developed, well-nourished white male in no acute distress. Blood pressure 124/69, pulse 73, temperature 98 F (36.7 C), temperature source Oral, resp. rate 18, height 5' 4" (1.626 m), weight 64.5 kg (142 lb 3.2 oz), SpO2 95 %. Lungs:   Clear to auscultation, symmetrical respiratory excursion Heart:  Regular rate and rhythm, no murmur. Abdomen:  Soft, nondistended, bowel sounds present. Extremity:   No clubbing, cyanosis, or edema.  Neurological  Examination: Mental Status Examination:  Awake, alert, oriented fully. Following commands. Speech fluent. Cranial Nerve Examination:  Pupils 3 mm, round, reactive to light. EOMI. Facial sensation. Facial movements symmetrical. Hearing present bilaterally. Palatal movements symmetrical. Shoulder shrug symmetrical. Tongue midline.  Motor Examination:  5/5 strength in the upper and lower extremities. No drift of the upper extremities. Sensory Examination:  Intact to pinprick throughout.  Reflex Examination:   Symmetrical, toes downgoing bilaterally.  Gait and Stance Examination:  Normal gait and stance.   Results for orders placed or performed during the hospital encounter of 08/04/15 (from the past 48 hour(s))  Comprehensive metabolic panel     Status: Abnormal   Collection Time: 08/04/15  6:37 PM  Result Value Ref Range   Sodium 138 135 - 145 mmol/L   Potassium 4.2 3.5 - 5.1 mmol/L   Chloride 102 101 - 111 mmol/L   CO2 25 22 - 32 mmol/L   Glucose, Bld 104 (  H) 65 - 99 mg/dL   BUN 13 6 - 20 mg/dL   Creatinine, Ser 0.84 0.61 - 1.24 mg/dL   Calcium 9.4 8.9 - 10.3 mg/dL   Total Protein 7.4 6.5 - 8.1 g/dL   Albumin 4.2 3.5 - 5.0 g/dL   AST 23 15 - 41 U/L   ALT 28 17 - 63 U/L   Alkaline Phosphatase 62 38 - 126 U/L   Total Bilirubin 0.7 0.3 - 1.2 mg/dL   GFR calc non Af Amer >60 >60 mL/min   GFR calc Af Amer >60 >60 mL/min    Comment: (NOTE) The eGFR has been calculated using the CKD EPI equation. This calculation has not been validated in all clinical situations. eGFR's persistently <60 mL/min signify possible Chronic Kidney Disease.    Anion gap 11 5 - 15  Lipase, blood     Status: None   Collection Time: 08/04/15  6:37 PM  Result Value Ref Range   Lipase 40 11 - 51 U/L  Troponin I     Status: None   Collection Time: 08/04/15  6:37 PM  Result Value Ref Range   Troponin I <0.03 <0.031 ng/mL    Comment:        NO INDICATION OF MYOCARDIAL INJURY.   CBC with Differential      Status: Abnormal   Collection Time: 08/04/15  6:37 PM  Result Value Ref Range   WBC 17.7 (H) 4.0 - 10.5 K/uL   RBC 5.07 4.22 - 5.81 MIL/uL   Hemoglobin 15.0 13.0 - 17.0 g/dL   HCT 43.8 39.0 - 52.0 %   MCV 86.4 78.0 - 100.0 fL   MCH 29.6 26.0 - 34.0 pg   MCHC 34.2 30.0 - 36.0 g/dL   RDW 14.7 11.5 - 15.5 %   Platelets 213 150 - 400 K/uL   Neutrophils Relative % 93 %   Neutro Abs 16.5 (H) 1.7 - 7.7 K/uL   Lymphocytes Relative 4 %   Lymphs Abs 0.7 0.7 - 4.0 K/uL   Monocytes Relative 3 %   Monocytes Absolute 0.4 0.1 - 1.0 K/uL   Eosinophils Relative 0 %   Eosinophils Absolute 0.1 0.0 - 0.7 K/uL   Basophils Relative 0 %   Basophils Absolute 0.0 0.0 - 0.1 K/uL  Magnesium     Status: None   Collection Time: 08/05/15  1:45 AM  Result Value Ref Range   Magnesium 1.9 1.7 - 2.4 mg/dL  Phosphorus     Status: None   Collection Time: 08/05/15  1:45 AM  Result Value Ref Range   Phosphorus 4.0 2.5 - 4.6 mg/dL  TSH     Status: None   Collection Time: 08/05/15  1:45 AM  Result Value Ref Range   TSH 0.708 0.350 - 4.500 uIU/mL  Comprehensive metabolic panel     Status: Abnormal   Collection Time: 08/05/15  1:45 AM  Result Value Ref Range   Sodium 137 135 - 145 mmol/L   Potassium 3.7 3.5 - 5.1 mmol/L   Chloride 104 101 - 111 mmol/L   CO2 25 22 - 32 mmol/L   Glucose, Bld 103 (H) 65 - 99 mg/dL   BUN 11 6 - 20 mg/dL   Creatinine, Ser 0.74 0.61 - 1.24 mg/dL   Calcium 8.7 (L) 8.9 - 10.3 mg/dL   Total Protein 6.3 (L) 6.5 - 8.1 g/dL   Albumin 3.6 3.5 - 5.0 g/dL   AST 18 15 - 41 U/L   ALT  24 17 - 63 U/L   Alkaline Phosphatase 55 38 - 126 U/L   Total Bilirubin 0.8 0.3 - 1.2 mg/dL   GFR calc non Af Amer >60 >60 mL/min   GFR calc Af Amer >60 >60 mL/min    Comment: (NOTE) The eGFR has been calculated using the CKD EPI equation. This calculation has not been validated in all clinical situations. eGFR's persistently <60 mL/min signify possible Chronic Kidney Disease.    Anion gap 8 5 - 15   CBC     Status: Abnormal   Collection Time: 08/05/15  1:45 AM  Result Value Ref Range   WBC 9.7 4.0 - 10.5 K/uL   RBC 4.34 4.22 - 5.81 MIL/uL   Hemoglobin 12.8 (L) 13.0 - 17.0 g/dL   HCT 37.5 (L) 39.0 - 52.0 %   MCV 86.4 78.0 - 100.0 fL   MCH 29.5 26.0 - 34.0 pg   MCHC 34.1 30.0 - 36.0 g/dL   RDW 15.0 11.5 - 15.5 %   Platelets 204 150 - 400 K/uL  Troponin I     Status: None   Collection Time: 08/05/15  1:45 AM  Result Value Ref Range   Troponin I <0.03 <0.031 ng/mL    Comment:        NO INDICATION OF MYOCARDIAL INJURY.   Lactic acid, plasma     Status: None   Collection Time: 08/05/15  1:45 AM  Result Value Ref Range   Lactic Acid, Venous 0.8 0.5 - 2.0 mmol/L  Urinalysis, Routine w reflex microscopic (not at [facility])     Status: None   Collection Time: 08/05/15  6:20 AM  Result Value Ref Range   Color, Urine YELLOW YELLOW   APPearance CLEAR CLEAR   Specific Gravity, Urine 1.012 1.005 - 1.030   pH 6.5 5.0 - 8.0   Glucose, UA NEGATIVE NEGATIVE mg/dL   Hgb urine dipstick NEGATIVE NEGATIVE   Bilirubin Urine NEGATIVE NEGATIVE   Ketones, ur NEGATIVE NEGATIVE mg/dL   Protein, ur NEGATIVE NEGATIVE mg/dL   Nitrite NEGATIVE NEGATIVE   Leukocytes, UA NEGATIVE NEGATIVE    Comment: MICROSCOPIC NOT DONE ON URINES WITH NEGATIVE PROTEIN, BLOOD, LEUKOCYTES, NITRITE, OR GLUCOSE <1000 mg/dL.  Troponin I     Status: None   Collection Time: 08/05/15  7:59 AM  Result Value Ref Range   Troponin I <0.03 <0.031 ng/mL    Comment:        NO INDICATION OF MYOCARDIAL INJURY.   Troponin I     Status: None   Collection Time: 08/05/15  2:22 PM  Result Value Ref Range   Troponin I <0.03 <0.031 ng/mL    Comment:        NO INDICATION OF MYOCARDIAL INJURY.     Dg Chest 2 View  08/04/2015  CLINICAL DATA:  Seizure-like episode. Lung cancer. Hypertension. Ex-smoker. EXAM: CHEST  2 VIEW COMPARISON:  10/23/2014 and PET of 07/02/2015 FINDINGS: Left rotator cuff repair. Midline trachea. Mild  cardiomegaly. Surgical clips in the left hilum with volume loss in the left hemithorax and elevation of the left hemidiaphragm. No pleural effusion or pneumothorax. Mild scarring at the left lung base. Aortic endograft repair. IMPRESSION: Similar appearance of surgical changes in the left hemi thorax. No acute superimposed process. Electronically Signed   By: Abigail Miyamoto M.D.   On: 08/04/2015 19:41   Ct Head Wo Contrast  08/04/2015  CLINICAL DATA:  Seizure-like episode.  The onset was unwitnessed. EXAM: CT HEAD WITHOUT CONTRAST TECHNIQUE:  Contiguous axial images were obtained from the base of the skull through the vertex without intravenous contrast. COMPARISON:  MRI brain 10/17/2013. FINDINGS: A 1.6 x 1.5 cm hyperdense mass lesion is present in the high right frontal lobe with surrounding vasogenic edema. No other focal mass lesions are present. There are no other focal areas of edema. No other acute infarct is present. There is no acute hemorrhage. The ventricles are of normal size. No significant extra-axial fluid collection is present. The paranasal sinuses and mastoid air cells are clear. Calvarium is intact. No significant extracranial soft tissue lesions are present. IMPRESSION: 1. 1.6 x 1.5 cm hyperdense mass lesion in the high right frontal lobe is most concerning for a focal metastasis. 2. No other focal lesions are evident. Recommend MRI the brain without and with contrast for further evaluation of metastatic disease. These results were called by telephone at the time of interpretation on 08/04/2015 at 7:47 pm to Dr. Charlesetta Shanks , who verbally acknowledged these results. Electronically Signed   By: San Morelle M.D.   On: 08/04/2015 19:48   Mr Jeri Cos DX Contrast  08/04/2015  CLINICAL DATA:  Flu shot at 9 a.m., vomiting beginning at 3 p.m. drank beer. Sudden onset seizure like symptoms. EXAM: MRI HEAD WITHOUT AND WITH CONTRAST TECHNIQUE: Multiplanar, multiecho pulse sequences of the  brain and surrounding structures were obtained without and with intravenous contrast. CONTRAST:  70m MULTIHANCE GADOBENATE DIMEGLUMINE 529 MG/ML IV SOLN COMPARISON:  CT head August 04, 2015 at 1939 hours FINDINGS: Multiple sequences are mildly more moderately motion degraded. RIGHT posterior frontal 2 x 1.7 cm rounded mass in the precentral gyrus demonstrates thin smooth peripheral enhancement. No associated susceptibility artifact to suggest hemorrhage, no reduced diffusion to suggest hypercellular tumor. A second T2 bright 11 x 7 mm mass within the RIGHT posterior frontal gray-white matter junction with similar peripheral enhancement. Small area of subtle FLAIR T2 hyperintense signal in the LEFT temporal cortex without enhancement favors artifact, less likely nonenhancing tiny metastatic deposit. No midline shift or mass effect. The ventricles and sulci are normal for patient's age. Mild patchy supratentorial white matter FLAIR T2 hyperintensities exclusive of the aforementioned vasogenic edema compatible with chronic small vessel ischemic disease. Normal major intracranial vascular flow voids seen at the skull base. No extra-axial masses or abnormal extra-axial enhancement. Ocular globes and orbital contents are unremarkable. Paranasal sinuses and mastoid air cells are well aerated. No abnormal sellar expansion. No cerebellar tonsillar ectopia. No suspicious calvarial bone marrow signal. IMPRESSION: Motion degraded examination degrades sensitivity for small metastatic deposits. Two enhancing masses in RIGHT frontal lobe measuring up to 2 x 1.7 cm, 1 of which corresponds to CT abnormality with imaging characteristics most consistent with metastatic disease. No significant mass effect. Involutional changes. Mild white matter changes compatible with chronic small vessel ischemic disease. Electronically Signed   By: CElon AlasM.D.   On: 08/04/2015 21:37     Assessment/Plan: Patient with a history of  lung cancer diagnosed in September 2014 who was treated with left upper lobectomy, chemotherapy, and radiation therapy and who had a recently good-looking PET scan according to Dr. MInda Merlin experienced a probable seizure last night, and has been found to have 2 ring-enhancing brain masses in the right frontal lobe. He is neurologically intact at this time.   I feel that these lesions are amenable to stereotactic radiosurgery, and have contacted Dr. JKyung Rudd the patient's radiation oncologist, via [facility] D[facility]. The patient will need  a 3T SRS protocol MRI as an outpatient.   I spoke with the patient and his wife at length at his bedside, explaining the nature of the patient's condition, the findings of his MRI last night, the need for the 3T SRS protocol MRI, and the nature of stereotactic radiosurgery. I've explained that the 3T SRS protocol MRI may reveal other lesions that will require treatment, but that most likely if those are found, they can be incorporated into the treatment for the 2 lesions already demonstrated. I explained that Dr. Lisbeth Renshaw and I work together with the radiation oncology staff in planning and administering treatment. They understand that operative surgery is unlikely to be necessary. I also discussed the need for continued surveillance following treatment with 3T SRS protocol MRIs approximately every 3 months. Their questions regarding his condition our recommendations for treatment and care were answered for them.  Management of the probable seizures is deferred to the medical neurology staff, and we do appreciate their assistance.  Hosie Spangle, MD 08/05/2015, 8:06 PM

## 2015-08-05 NOTE — Progress Notes (Signed)
No clear EEG changes, but after discussing with wife, patient had bilateral arm extension with vocalization to which the patient is amnestic. With structural lesion and description consistent with seizure, I think that there is little doubt that this was a seizure.   I would continue keppra '500mg'$  BID. He will need treatment of his brain tumors per rad onc and  oncology.   Please call with any further questions or concerns. Neurology to sign off.   Roland Rack, MD [facility] [phone]  If 7pm- 7am, please page neurology on call as listed in [place].

## 2015-08-06 ENCOUNTER — Encounter: Payer: Self-pay | Admitting: Radiation Therapy

## 2015-08-06 ENCOUNTER — Ambulatory Visit: Payer: Medicare Other | Admitting: Family Medicine

## 2015-08-06 ENCOUNTER — Other Ambulatory Visit: Payer: Self-pay | Admitting: Radiation Therapy

## 2015-08-06 ENCOUNTER — Ambulatory Visit
Admission: RE | Admit: 2015-08-06 | Discharge: 2015-08-06 | Disposition: A | Discharge status: Home / Self Care | Payer: Medicare Other | Source: Ambulatory Visit | Attending: Radiation Oncology | Admitting: Radiation Oncology

## 2015-08-06 ENCOUNTER — Other Ambulatory Visit: Payer: Self-pay | Admitting: Radiation Oncology

## 2015-08-06 ENCOUNTER — Encounter: Payer: Self-pay | Admitting: Radiation Oncology

## 2015-08-06 ENCOUNTER — Ambulatory Visit: Payer: Medicare Other | Attending: Radiation Oncology | Admitting: Radiation Oncology

## 2015-08-06 DIAGNOSIS — R569 Unspecified convulsions: Secondary | ICD-10-CM | POA: Diagnosis not present

## 2015-08-06 DIAGNOSIS — C7931 Secondary malignant neoplasm of brain: Secondary | ICD-10-CM | POA: Diagnosis not present

## 2015-08-06 DIAGNOSIS — C3412 Malignant neoplasm of upper lobe, left bronchus or lung: Secondary | ICD-10-CM | POA: Diagnosis not present

## 2015-08-06 DIAGNOSIS — C7949 Secondary malignant neoplasm of other parts of nervous system: Secondary | ICD-10-CM

## 2015-08-06 DIAGNOSIS — I10 Essential (primary) hypertension: Secondary | ICD-10-CM | POA: Diagnosis not present

## 2015-08-06 LAB — URINE CULTURE: Culture: NO GROWTH

## 2015-08-06 MED ORDER — LEVETIRACETAM 500 MG PO TABS: 500.0000 mg | ORAL_TABLET | Freq: Two times a day (BID) | ORAL | Status: DC

## 2015-08-06 NOTE — Progress Notes (Signed)
08/06/15 1000  Reviewed discharge instructions with patient and his wife.  Both verbalized understanding of discharge instructions. Copy of discharge instructions and prescription given to patient.

## 2015-08-06 NOTE — Progress Notes (Signed)
I spoke to the patient today regarding his recent finding of brain metastasis. He looks like a good candidate for radiosurgery and he does wish to proceed with this treatment. We will schedule this as an outpatient. We reviewed the logistics of treatment which will include another MRI scan of the brain as well as a simulation in our department.  I spoken to Dr. Cleotis Lema his case as well and he is in agreement.  I look forward to seeing the patient as an outpatient to proceed with his course of radiosurgery to his 2 intracranial metastases. The patient is aware that additional findings are possible with his upcoming repeat MRI scan.

## 2015-08-06 NOTE — Progress Notes (Signed)
1.  Do you need a wheel chair? no     2. On oxygen? no  3. Have you ever had any surgery in the body part being scanned?  no  4. Have you ever had any surgery on your brain or heart?  no                                5. Have you ever had surgery on your eyes or ears?   no                              6. Do you have a pacemaker or defibrillator?  no   7. Do you have a Neurostimulator?   No  8. Claustrophobic?  Yes a little. Has asked for some Ativan to better tolerate the scan. Dr. Lisbeth Renshaw, his Radiation Oncologist will be calling that in for him beforehand.  9. Any risk for metal in eyes?  no  10. Injury by bullet, buckshot, or shrapnel?  no  11. Stent?    none                                                                                                                       12. Hx of Cancer?  Yes- Lung cancer with mets to the brain     13. Kidney or Liver disease?  no  14. Hx of Lupus, Rheumatoid Arthritis or Scleroderma?  no  15. IV Antibiotics or long term use of NSAIDS?  no  16. HX of Hypertension?  no  17. Diabetes?  no  18. Allergy to contrast?  - CT contrast but not MRI  19. Recent labs. Labs in [facility] from recent admission  Questions answered over the phone 12/16.  Mont Dutton

## 2015-08-08 NOTE — Discharge Summary (Signed)
Physician Discharge Summary  Jordan Mccullough:[id number] DOB: March 01, 1941 DOA: 08/04/2015  PCP: [name], MD  Admit date: 08/04/2015 Discharge date: 12/16  Time spent: 35 minutes  Recommendations for Outpatient Follow-up:  1. Radiation oncology as well as NS   Discharge Diagnoses:  Active Problems:   CAD (coronary artery disease)   Lung cancer (HCC)   Essential hypertension   AAA (abdominal aortic aneurysm) without rupture (HCC)   Seizure-like activity ([place])   Brain metastases ([place])   Seizure ([place])   Syncope   Discharge Condition: improved  Diet recommendation: cardiac  Filed Weights   08/05/15 0105  Weight: 64.5 kg (142 lb 3.2 oz)    History of present illness:  Jordan Mccullough is a 74 y.o. male   has a past medical history of Hyperlipidemia; Diverticulosis; Coronary artery disease; Peripheral vascular disease ([place]) (1/14); AAA (abdominal aortic aneurysm) ([place]); Hearing loss; Myocardial infarction ([place]) (1992); History of radiation therapy (11/10/13-12/12/13); Lung cancer ([place]) (10-14); Hypertension; and Shortness of breath.   Presented with nausea and vomiting starting at 2 PM this was associated with diaphoresis but no chest pain. No Shortness of breath. He did reports a headache. He felt lightheaded stating he was about to pass out. Of note early in the morning patient had had a flu shot at 9:30 Patient was not feeling well after having repeated nausea and vomiting. He decided to drink some beer to make some feel better. Wife left the room and when she came back from him with his eyes rolled in the back of his head and He was shaking all over. Patient usually drinks about 6 beers a day. Family denies hx of withdrawal.  Wife Witnessed 5-10 seconds of activity. No bowel or urinary incontinence. Wife describes his arms were up in the air and rhythmically shakeing There is no prior history of seizures. Upon arrival of EMS patient was not postictal. CT scan of the  head was done showing 1.6 x 1.5 hyperdense mass in the right frontal lobe worrisome for focal metastases. MRI was done showing 2 enhancing masses in the right frontal lobe measuring up to 2 x 1.7 cm no mass effect. ER provider has discussed case with neurosurgery as well as neurology.  Neurology recommendations will start on Keppra And obtain EEG, may require radiation treatment will need to discuss with radiation oncology tomorrow  Examination unknown history of stage IIIA lung cancer to left upper lobe status post chemotherapy and radiation therapy completed in 2015 Hospitalist was called for admission for seizure-like activity and is setting of newly diagnosed metastatic spread of lung cancer to the brain.   Hospital Course:  Seizures. -EEG done -due to brain mets -keppra  Lung cancer ([place]) with new diagnosis of metastatic spread to the brain -keppra -neurosurgery to follow outpatient -radiation oncology consult- radiotherapy  Essential hypertension hold amlodipine but continue metoprolol much blood pressure   CAD (coronary artery disease) currently stable continue to monitor no chest pain given diaphoresis will cycle cardiac enzymes   AAA (abdominal aortic aneurysm) without rupture (HCC) - chronic   Tobacco abuse -nicotine patch  Procedures:    Consultations:  NS  Neurology  Rad onc  Discharge Exam: Filed Vitals:   08/06/15 0523 08/06/15 0946  BP: 118/74 138/88  Pulse: 75 87  Temp: 97.8 F (36.6 C)   Resp: 18 20    General: awake, NAD   Discharge Instructions   Discharge Instructions    Diet - low sodium heart healthy  Complete by:  As directed      Driving Restrictions    Complete by:  As directed   No driving due to seizures     Increase activity slowly    Complete by:  As directed           Discharge Medication List as of 08/06/2015 10:48 AM    START taking these medications   Details  levETIRAcetam (KEPPRA) 500 MG tablet Take 1  tablet (500 mg total) by mouth 2 (two) times daily., Starting 08/06/2015, Until Discontinued, Print      CONTINUE these medications which have NOT CHANGED   Details  aspirin EC 81 MG tablet Take 81 mg by mouth daily., Until Discontinued, Historical Med    budesonide-formoterol (SYMBICORT) 160-4.5 MCG/ACT inhaler Inhale 1-2 puffs into the lungs every 12 (twelve) hours. Gargle and spit after use., Starting 06/07/2015, Until Discontinued, Normal    CRESTOR 40 MG tablet TAKE 1 TABLET (40 MG TOTAL) BY MOUTH DAILY., Normal    Glucosamine-Chondroit-Vit C-Mn (GLUCOSAMINE 1500 COMPLEX PO) Take 1,500 mg by mouth daily. , Until Discontinued, Historical Med    metoprolol (LOPRESSOR) 50 MG tablet Take 1 tablet (50 mg total) by mouth 2 (two) times daily. --- Pt needs appt for further refills., Starting 07/14/2015, Until Discontinued, Normal    Multiple Vitamin (MULTIVITAMIN) tablet Take 1 tablet by mouth every evening. , Until Discontinued, Historical Med      STOP taking these medications     amLODipine (NORVASC) 5 MG tablet        Allergies  Allergen Reactions  . Fluvirin [Influenza Vac Typ A&B Surf Ant] Nausea And Vomiting and Other (See Comments)    Seizure like activity?  Marland Kitchen Iodinated Diagnostic Agents Hives    1 hive on lt cheek lasting approximately 1 hour on last 2 CT per pt; needs pre meds in future; 50 mg benadryl po 1 hr prior to exam per Dr. Weber Cooks   Follow-up Information    Follow up with [name], MD In 1 week.   Specialty:  Internal Medicine   Contact information:   [address] [address] [phone]       Follow up with Kyung Rudd, MD.   Specialty:  Radiation Oncology   Why:  navigator will schedule for you   Contact information:   [address] [place] [id number] [phone]        The results of significant diagnostics from this hospitalization (including imaging, microbiology, ancillary and laboratory) are listed below for reference.     Significant Diagnostic Studies: Dg Chest 2 View  08/04/2015  CLINICAL DATA:  Seizure-like episode. Lung cancer. Hypertension. Ex-smoker. EXAM: CHEST  2 VIEW COMPARISON:  10/23/2014 and PET of 07/02/2015 FINDINGS: Left rotator cuff repair. Midline trachea. Mild cardiomegaly. Surgical clips in the left hilum with volume loss in the left hemithorax and elevation of the left hemidiaphragm. No pleural effusion or pneumothorax. Mild scarring at the left lung base. Aortic endograft repair. IMPRESSION: Similar appearance of surgical changes in the left hemi thorax. No acute superimposed process. Electronically Signed   By: Abigail Miyamoto M.D.   On: 08/04/2015 19:41   Ct Head Wo Contrast  08/04/2015  CLINICAL DATA:  Seizure-like episode.  The onset was unwitnessed. EXAM: CT HEAD WITHOUT CONTRAST TECHNIQUE: Contiguous axial images were obtained from the base of the skull through the vertex without intravenous contrast. COMPARISON:  MRI brain 10/17/2013. FINDINGS: A 1.6 x 1.5 cm hyperdense mass lesion is  present in the high right frontal lobe with surrounding vasogenic edema. No other focal mass lesions are present. There are no other focal areas of edema. No other acute infarct is present. There is no acute hemorrhage. The ventricles are of normal size. No significant extra-axial fluid collection is present. The paranasal sinuses and mastoid air cells are clear. Calvarium is intact. No significant extracranial soft tissue lesions are present. IMPRESSION: 1. 1.6 x 1.5 cm hyperdense mass lesion in the high right frontal lobe is most concerning for a focal metastasis. 2. No other focal lesions are evident. Recommend MRI the brain without and with contrast for further evaluation of metastatic disease. These results were called by telephone at the time of interpretation on 08/04/2015 at 7:47 pm to Dr. Charlesetta Shanks , who verbally acknowledged these results. Electronically Signed   By: San Morelle M.D.   On:  08/04/2015 19:48   Mr Jeri Cos KD Contrast  08/04/2015  CLINICAL DATA:  Flu shot at 9 a.m., vomiting beginning at 3 p.m. drank beer. Sudden onset seizure like symptoms. EXAM: MRI HEAD WITHOUT AND WITH CONTRAST TECHNIQUE: Multiplanar, multiecho pulse sequences of the brain and surrounding structures were obtained without and with intravenous contrast. CONTRAST:  29m MULTIHANCE GADOBENATE DIMEGLUMINE 529 MG/ML IV SOLN COMPARISON:  CT head August 04, 2015 at 1939 hours FINDINGS: Multiple sequences are mildly more moderately motion degraded. RIGHT posterior frontal 2 x 1.7 cm rounded mass in the precentral gyrus demonstrates thin smooth peripheral enhancement. No associated susceptibility artifact to suggest hemorrhage, no reduced diffusion to suggest hypercellular tumor. A second T2 bright 11 x 7 mm mass within the RIGHT posterior frontal gray-white matter junction with similar peripheral enhancement. Small area of subtle FLAIR T2 hyperintense signal in the LEFT temporal cortex without enhancement favors artifact, less likely nonenhancing tiny metastatic deposit. No midline shift or mass effect. The ventricles and sulci are normal for patient's age. Mild patchy supratentorial white matter FLAIR T2 hyperintensities exclusive of the aforementioned vasogenic edema compatible with chronic small vessel ischemic disease. Normal major intracranial vascular flow voids seen at the skull base. No extra-axial masses or abnormal extra-axial enhancement. Ocular globes and orbital contents are unremarkable. Paranasal sinuses and mastoid air cells are well aerated. No abnormal sellar expansion. No cerebellar tonsillar ectopia. No suspicious calvarial bone marrow signal. IMPRESSION: Motion degraded examination degrades sensitivity for small metastatic deposits. Two enhancing masses in RIGHT frontal lobe measuring up to 2 x 1.7 cm, 1 of which corresponds to CT abnormality with imaging characteristics most consistent with  metastatic disease. No significant mass effect. Involutional changes. Mild white matter changes compatible with chronic small vessel ischemic disease. Electronically Signed   By: CElon AlasM.D.   On: 08/04/2015 21:37    Microbiology: Recent Results (from the past 240 hour(s))  Urine culture     Status: None   Collection Time: 08/05/15  6:20 AM  Result Value Ref Range Status   Specimen Description URINE, CLEAN CATCH  Final   Special Requests NONE  Final   Culture   Final    NO GROWTH 1 DAY [facility]   Report Status 08/06/2015 FINAL  Final     Labs: Basic Metabolic Panel:  Recent Labs Lab 08/04/15 1837 08/05/15 0145  NA 138 137  K 4.2 3.7  CL 102 104  CO2 25 25  GLUCOSE 104* 103*  BUN 13 11  CREATININE 0.84 0.74  CALCIUM 9.4 8.7*  MG  --  1.9  PHOS  --  4.0   Liver Function Tests:  Recent Labs Lab 08/04/15 1837 08/05/15 0145  AST 23 18  ALT 28 24  ALKPHOS 62 55  BILITOT 0.7 0.8  PROT 7.4 6.3*  ALBUMIN 4.2 3.6    Recent Labs Lab 08/04/15 1837  LIPASE 40   No results for input(s): AMMONIA in the last 168 hours. CBC:  Recent Labs Lab 08/04/15 1837 08/05/15 0145  WBC 17.7* 9.7  NEUTROABS 16.5*  --   HGB 15.0 12.8*  HCT 43.8 37.5*  MCV 86.4 86.4  PLT 213 204   Cardiac Enzymes:  Recent Labs Lab 08/04/15 1837 08/05/15 0145 08/05/15 0759 08/05/15 1422  TROPONINI <0.03 <0.03 <0.03 <0.03   BNP: BNP (last 3 results) No results for input(s): BNP in the last 8760 hours.  ProBNP (last 3 results) No results for input(s): PROBNP in the last 8760 hours.  CBG: No results for input(s): GLUCAP in the last 168 hours.     Signed:  Geradine Girt  [facility] 08/08/2015, 2:38 PM

## 2015-08-09 ENCOUNTER — Telehealth: Payer: Self-pay | Admitting: *Deleted

## 2015-08-09 NOTE — Telephone Encounter (Signed)
Called [facility] [phone], left rx for Ativan 0.'5mg'$  tablet ,to take 1 tblet 0.'5mg'$  prior to CT Simulation, and then 0.'5mg'$  tablet prior to [facility] treatment , please call for any questins  At [phone], then called the patient, [phone], left vm can pick up rx later today,can call for any questions 10:14 AM

## 2015-08-10 ENCOUNTER — Encounter: Payer: Self-pay | Admitting: Radiation Oncology

## 2015-08-12 ENCOUNTER — Ambulatory Visit
Admission: RE | Admit: 2015-08-12 | Discharge: 2015-08-12 | Disposition: A | Discharge status: Home / Self Care | Payer: Medicare Other | Source: Ambulatory Visit | Attending: Radiation Oncology | Admitting: Radiation Oncology

## 2015-08-12 DIAGNOSIS — C7931 Secondary malignant neoplasm of brain: Secondary | ICD-10-CM

## 2015-08-12 DIAGNOSIS — C7949 Secondary malignant neoplasm of other parts of nervous system: Secondary | ICD-10-CM

## 2015-08-12 DIAGNOSIS — C3490 Malignant neoplasm of unspecified part of unspecified bronchus or lung: Secondary | ICD-10-CM | POA: Diagnosis not present

## 2015-08-12 MED ORDER — GADOBENATE DIMEGLUMINE 529 MG/ML IV SOLN
15.0000 mL | Freq: Once | INTRAVENOUS | Status: AC | PRN
  Administered 2015-08-12: 15 mL via INTRAVENOUS

## 2015-08-13 ENCOUNTER — Ambulatory Visit
Admission: RE | Admit: 2015-08-13 | Discharge: 2015-08-13 | Disposition: A | Discharge status: Home / Self Care | Payer: Medicare Other | Source: Ambulatory Visit | Attending: Radiation Oncology | Admitting: Radiation Oncology

## 2015-08-13 ENCOUNTER — Ambulatory Visit: Admission: RE | Admit: 2015-08-13 | Payer: Medicare Other | Source: Ambulatory Visit

## 2015-08-13 ENCOUNTER — Ambulatory Visit: Payer: Medicare Other

## 2015-08-13 DIAGNOSIS — C801 Malignant (primary) neoplasm, unspecified: Secondary | ICD-10-CM | POA: Insufficient documentation

## 2015-08-13 DIAGNOSIS — Z510 Encounter for antineoplastic radiation therapy: Secondary | ICD-10-CM | POA: Insufficient documentation

## 2015-08-13 DIAGNOSIS — C3410 Malignant neoplasm of upper lobe, unspecified bronchus or lung: Secondary | ICD-10-CM | POA: Diagnosis not present

## 2015-08-13 DIAGNOSIS — C7931 Secondary malignant neoplasm of brain: Secondary | ICD-10-CM | POA: Diagnosis not present

## 2015-08-13 MED ORDER — DEXAMETHASONE 4 MG PO TABS: 8.0000 mg | ORAL_TABLET | Freq: Two times a day (BID) | ORAL | Status: DC

## 2015-08-13 NOTE — Progress Notes (Signed)
  Radiation Oncology         [phone] ________________________________  Name: Jordan Mccullough MRN: [id number]  Date: 08/13/2015  DOB: 13-Oct-1940  SIMULATION AND TREATMENT PLANNING NOTE    ICD-9-CM ICD-10-CM   1. Brain metastases ([place]) 198.3 C79.31     NARRATIVE:  The patient was brought to the [facility].  Identity was confirmed.  All relevant records and images related to the planned course of therapy were reviewed.  The patient freely provided informed written consent to proceed with treatment after reviewing the details related to the planned course of therapy. The consent form was witnessed and verified by the simulation staff. Intravenous access was established for contrast administration. Then, the patient was set-up in a stable reproducible supine position for radiation therapy.  A relocatable thermoplastic stereotactic head frame was fabricated for precise immobilization.  CT images were obtained.  Surface markings were placed.  The CT images were loaded into the planning software and fused with the patient's targeting MRI scan.  Then the target and avoidance structures were contoured.  Treatment planning then occurred.  The radiation prescription was entered and confirmed.  I have requested 3D planning  I have requested a DVH of the following structures: Brain stem, brain, left eye, right eye, lenses, optic chiasm, target volumes, uninvolved brain, and normal tissue.    SPECIAL TREATMENT PROCEDURE:  The planned course of therapy using radiation constitutes a special treatment procedure. Special care is required in the management of this patient for the following reasons. This treatment constitutes a Special Treatment Procedure for the following reason: High dose per fraction requiring special monitoring for increased toxicities of treatment including daily imaging.  The special nature of the planned course of radiotherapy will require increased physician supervision and  oversight to ensure patient's safety with optimal treatment outcomes.  PLAN:  The patient will receive 20 Gy in 1 fraction to the largest lesion and 18 Gy in 1 fraction to the smaller, more superior lesion.  ________________________________  Jodelle Gross, MD, PhD  This document serves as a record of services personally performed by Kyung Rudd, MD. It was created on his behalf by Darcus Austin, a trained medical scribe. The creation of this record is based on the scribe's personal observations and the provider's statements to them. This document has been checked and approved by the attending provider.

## 2015-08-18 ENCOUNTER — Encounter: Payer: Self-pay | Admitting: Radiation Oncology

## 2015-08-19 DIAGNOSIS — C3410 Malignant neoplasm of upper lobe, unspecified bronchus or lung: Secondary | ICD-10-CM | POA: Diagnosis not present

## 2015-08-19 DIAGNOSIS — C801 Malignant (primary) neoplasm, unspecified: Secondary | ICD-10-CM | POA: Diagnosis not present

## 2015-08-19 DIAGNOSIS — Z510 Encounter for antineoplastic radiation therapy: Secondary | ICD-10-CM | POA: Diagnosis not present

## 2015-08-19 DIAGNOSIS — C7931 Secondary malignant neoplasm of brain: Secondary | ICD-10-CM | POA: Diagnosis not present

## 2015-08-25 ENCOUNTER — Ambulatory Visit
Admission: RE | Admit: 2015-08-25 | Discharge: 2015-08-25 | Disposition: A | Discharge status: Home / Self Care | Payer: Medicare Other | Source: Ambulatory Visit | Attending: Radiation Oncology | Admitting: Radiation Oncology

## 2015-08-25 ENCOUNTER — Encounter: Payer: Self-pay | Admitting: Radiation Oncology

## 2015-08-25 VITALS — BP 134/86 | HR 67 | Temp 97.6°F

## 2015-08-25 DIAGNOSIS — C7931 Secondary malignant neoplasm of brain: Secondary | ICD-10-CM

## 2015-08-25 DIAGNOSIS — Z510 Encounter for antineoplastic radiation therapy: Secondary | ICD-10-CM | POA: Diagnosis not present

## 2015-08-25 DIAGNOSIS — C3410 Malignant neoplasm of upper lobe, unspecified bronchus or lung: Secondary | ICD-10-CM | POA: Diagnosis not present

## 2015-08-25 DIAGNOSIS — C801 Malignant (primary) neoplasm, unspecified: Secondary | ICD-10-CM | POA: Diagnosis not present

## 2015-08-25 NOTE — Progress Notes (Signed)
[name] observed for 30 minutes s/p SRS Brain.  He denies any headaches, blurred vision,

## 2015-08-25 NOTE — Op Note (Signed)
Stereotactic Radiosurgery Operative Note  Name: Jordan Mccullough MRN: [id number]  Date: 08/25/2015  DOB: 1941-01-12  Op Note  Pre Operative Diagnosis:  Metastatic lung cancer with multiple brain metastases  Post Operative Diagnois:  Metastatic lung cancer with multiple brain metastases  3D TREATMENT PLANNING AND DOSIMETRY:  The patient's radiation plan was reviewed and approved by myself (neurosurgery) and Dr. Kyung Rudd (radiation oncology) prior to treatment.  It showed 3-dimensional radiation distributions overlaid onto the planning CT/MRI image set.  The [facility] for the target structures as well as the organs at risk were reviewed. The documentation of the 3D plan and dosimetry are filed in the radiation oncology EMR.  NARRATIVE:  Jordan Mccullough was brought to the TrueBeam stereotactic radiation treatment machine and placed supine on the CT couch. The head frame was applied, and the patient was set up for stereotactic radiosurgery.  I was present for the set-up and delivery.  SIMULATION VERIFICATION:  In the couch zero-angle position, the patient underwent Exactrac imaging using the Brainlab system with orthogonal KV images.  These were carefully aligned and repeated to confirm treatment position for each of the isocenters.  The Exactrac snap film verification was repeated at each couch angle.  SPECIAL TREATMENT PROCEDURE: Jordan Mccullough received stereotactic radiosurgery to the following targets: Right vertex frontoparietal (22 mm) target was treated using 4 Dynamic Conformal Arcs to a prescription dose of 18 Gy.  ExacTrac registration was performed for each couch angle.  The 76.9% isodose line was prescribed. Right posterior frontal (80m) target was treated using 4 Dynamic Conformal Arcs to a prescription dose of 20 Gy.  ExacTrac registration was performed for each couch angle.  The 82% isodose line was prescribed.  STEREOTACTIC TREATMENT MANAGEMENT:  Following delivery, the patient was  transported to nursing in stable condition and monitored for possible acute effects.  Vital signs were recorded BP 134/86 mmHg  Pulse 67  Temp(Src) 97.6 F (36.4 C). The patient tolerated treatment without significant acute effects, and was discharged to home in stable condition.    PLAN: Follow-up in one month.

## 2015-08-26 NOTE — Progress Notes (Signed)
  Radiation Oncology         [phone] ________________________________  Name: Jordan Mccullough MRN: [id number]  Date: 08/25/2015  DOB: 02-Feb-1941   SPECIAL TREATMENT PROCEDURE   3D TREATMENT PLANNING AND DOSIMETRY: The patient's radiation plan was reviewed and approved by Dr. Sherwood Gambler from neurosurgery and radiation oncology prior to treatment. It showed 3-dimensional radiation distributions overlaid onto the planning CT/MRI image set. The [facility] for the target structures as well as the organs at risk were reviewed. The documentation of the 3D plan and dosimetry are filed in the radiation oncology EMR.   NARRATIVE: The patient was brought to the TrueBeam stereotactic radiation treatment machine and placed supine on the CT couch. The head frame was applied, and the patient was set up for stereotactic radiosurgery. Neurosurgery was present for the set-up and delivery   SIMULATION VERIFICATION: In the couch zero-angle position, the patient underwent Exactrac imaging using the Brainlab system with orthogonal KV images. These were carefully aligned and repeated to confirm treatment position for each of the isocenters. The Exactrac snap film verification was repeated at each couch angle.   SPECIAL TREATMENT PROCEDURE: The patient received stereotactic radiosurgery to the following targets:  1.  PTV1 Rt Vertex Frontoparietal 55m target was treated using 4 Arcs to a prescription dose of 18 Gy. ExacTrac Snap verification was performed for each couch angle.  2.  PTV2  Rt Posterior Frontal Lobe 130mtarget was treated using 4 Arcs to a prescription dose of 20 Gy. ExacTrac Snap verification was performed for each couch angle.  STEREOTACTIC TREATMENT MANAGEMENT: Following delivery, the patient was transported to nursing in stable condition and monitored for possible acute effects. Vital signs were recorded . The patient tolerated treatment without significant acute effects, and was discharged to home in stable  condition.  PLAN: Follow-up in one month.   ------------------------------------------------  JoJodelle GrossMD, PhD

## 2015-08-26 NOTE — Progress Notes (Signed)
  Radiation Oncology         [phone] ________________________________  Name: Jordan Mccullough MRN: [id number]  Date: 08/25/2015  DOB: May 17, 1941  End of Treatment Note  Diagnosis:   Metastatic lung cancer    ICD-9-CM ICD-10-CM   1. Brain metastases ([place]) 198.3 C79.31         Indication for treatment:  palliative       Radiation treatment dates:   08/25/15  Site/dose:    1.  PTV1 Rt Vertex Frontoparietal 22m target was treated using 4 Arcs to a prescription dose of 18 Gy. ExacTrac Snap verification was performed for each couch angle.  2.  PTV2  Rt Posterior Frontal Lobe 164mtarget was treated using 4 Arcs to a prescription dose of 20 Gy. ExacTrac Snap verification was performed for each couch angle.  Narrative: The patient tolerated radiation treatment well.   There were no signs of acute toxicity after treatment. Instructions for tapering decadron was given.  Plan: The patient has completed radiation treatment. The patient will return to radiation oncology clinic for routine followup in one month. I advised the patient to call or return sooner if they have any questions or concerns related to their recovery or treatment. ________________________________  ------------------------------------------------  JoJodelle GrossMD, PhD

## 2015-08-29 ENCOUNTER — Other Ambulatory Visit: Payer: Self-pay | Admitting: Internal Medicine

## 2015-09-02 ENCOUNTER — Other Ambulatory Visit: Payer: Self-pay | Admitting: Radiation Oncology

## 2015-09-02 ENCOUNTER — Telehealth: Payer: Self-pay | Admitting: Family Medicine

## 2015-09-02 ENCOUNTER — Other Ambulatory Visit: Payer: Self-pay | Admitting: *Deleted

## 2015-09-02 MED ORDER — LEVETIRACETAM 500 MG PO TABS: 500.0000 mg | ORAL_TABLET | Freq: Two times a day (BID) | ORAL | Status: DC

## 2015-09-02 NOTE — Telephone Encounter (Signed)
Pt advised and voiced understanding.   

## 2015-09-02 NOTE — Telephone Encounter (Signed)
Pt called requesting refills. He stated that he was a Dr. Linna Darner pt and that he only has two days left of this medication which is to keep him from having seizures. Please advise. Thanks.   RF request for keppra LOV: None Next ov: None - but is going to schedule an apt to transfer to Dr. Anitra Lauth Last written: 08/06/15 #60 w/ 0RF

## 2015-09-02 NOTE — Telephone Encounter (Signed)
Pls let this pt know I RF'd his keppra.-thx

## 2015-09-07 ENCOUNTER — Ambulatory Visit (INDEPENDENT_AMBULATORY_CARE_PROVIDER_SITE_OTHER): Payer: Medicare Other | Admitting: Family Medicine

## 2015-09-07 ENCOUNTER — Encounter: Payer: Self-pay | Admitting: Family Medicine

## 2015-09-07 VITALS — BP 142/93 | HR 84 | Temp 97.9°F | Resp 16 | Ht 64.0 in | Wt 161.5 lb

## 2015-09-07 DIAGNOSIS — R5383 Other fatigue: Secondary | ICD-10-CM

## 2015-09-07 DIAGNOSIS — C3492 Malignant neoplasm of unspecified part of left bronchus or lung: Secondary | ICD-10-CM | POA: Diagnosis not present

## 2015-09-07 DIAGNOSIS — I10 Essential (primary) hypertension: Secondary | ICD-10-CM

## 2015-09-07 DIAGNOSIS — E785 Hyperlipidemia, unspecified: Secondary | ICD-10-CM

## 2015-09-07 DIAGNOSIS — R609 Edema, unspecified: Secondary | ICD-10-CM

## 2015-09-07 LAB — COMPREHENSIVE METABOLIC PANEL
ALT: 48 U/L (ref 0–53)
AST: 24 U/L (ref 0–37)
Albumin: 3.6 g/dL (ref 3.5–5.2)
Alkaline Phosphatase: 49 U/L (ref 39–117)
BUN: 24 mg/dL — ABNORMAL HIGH (ref 6–23)
CO2: 30 mEq/L (ref 19–32)
Calcium: 8.9 mg/dL (ref 8.4–10.5)
Chloride: 102 mEq/L (ref 96–112)
Creatinine, Ser: 0.69 mg/dL (ref 0.40–1.50)
GFR: 119.26 mL/min (ref >60.00)
Glucose, Bld: 99 mg/dL (ref 70–99)
Potassium: 5.3 mEq/L — ABNORMAL HIGH (ref 3.5–5.1)
Sodium: 138 mEq/L (ref 135–145)
Total Bilirubin: 0.5 mg/dL (ref 0.2–1.2)
Total Protein: 6 g/dL (ref 6.0–8.3)

## 2015-09-07 LAB — CBC WITH DIFFERENTIAL/PLATELET
Basophils Absolute: 0 10*3/uL (ref 0.0–0.1)
Basophils Relative: 0 % (ref 0.0–3.0)
Eosinophils Absolute: 0 10*3/uL (ref 0.0–0.7)
Eosinophils Relative: 0 % (ref 0.0–5.0)
HCT: 45.6 % (ref 39.0–52.0)
Hemoglobin: 15 g/dL (ref 13.0–17.0)
Lymphocytes Relative: 3.3 % — ABNORMAL LOW (ref 12.0–46.0)
Lymphs Abs: 0.6 10*3/uL — ABNORMAL LOW (ref 0.7–4.0)
MCHC: 32.9 g/dL (ref 30.0–36.0)
MCV: 86.9 fl (ref 78.0–100.0)
Monocytes Absolute: 0.7 10*3/uL (ref 0.1–1.0)
Monocytes Relative: 4.3 % (ref 3.0–12.0)
Neutro Abs: 15.3 10*3/uL — ABNORMAL HIGH (ref 1.4–7.7)
Neutrophils Relative %: 92.4 % — ABNORMAL HIGH (ref 43.0–77.0)
Platelets: 131 10*3/uL — ABNORMAL LOW (ref 150.0–400.0)
RBC: 5.25 Mil/uL (ref 4.22–5.81)
RDW: 16.1 % — ABNORMAL HIGH (ref 11.5–15.5)
WBC: 16.6 10*3/uL — ABNORMAL HIGH (ref 4.0–10.5)

## 2015-09-07 NOTE — Progress Notes (Signed)
Office Note 09/07/2015  CC:  Chief Complaint  Patient presents with  . Establish Care    transfering from Dr. Linna Darner. Pt is not fasting.    HPI:  Jordan Mccullough is a 75 y.o. White male who is here with his wife today to transfer/establish care. Patient's most recent primary MD: Dr. Linna Darner (retired). Old records in EPIC/[facility] EMR were reviewed prior to or during today's visit.  He was recently dx'd with brain meds (lung ca) in mid Dec 2016 at which time he was started on decadron and keppra. Current complaints have to do with recently being put on decadron and having excessive appetite and swelling. Generalized weakness started about 2-3 days ago.  No focal weakness, no tremulousness.    He has hx of well controlled HTN.  HLD: compliant with crestor.  CBC, CMET, TSH wnl about 1 mo ago. Last lipids check was 2014: he is not fasting today.  Past Medical History  Diagnosis Date  . Hyperlipidemia   . Diverticulosis   . Coronary artery disease     MI 1992, S/P  PTCA; negative stress test in November 2011 with no ischemia.   . Peripheral vascular disease ([place]) 1/14    4.8x4.6 cm infrarenal abdominal aortic fusiform aneurysm, 1.5 cm right common iliac artery aneurysm  . AAA (abdominal aortic aneurysm) ([place])   . Hearing loss     Bilateral   . Myocardial infarction [facility]) 1992    Dr Angelena Form    . History of radiation therapy 11/10/13-12/12/13    lung,50Gy/43f  . [place]    non-small cell lung cancer; surgery + chemo+rad.  Remission until seizure 07/2015--MRI showed brain mets.  Decadron started.  . Hypertension   . Shortness of breath     Past Surgical History  Procedure Laterality Date  . Ptca  1992  . Inguinal heriiorrhaphy bilaterally    . Colonoscopy w/ polypectomy  2003     negative 2010,due 2020; Dr BOlevia Perches . Pilonidal cyst excision    . Rotator cuff repair      Bilateral  . Cystoscopy/retrograde/ureteroscopy Bilateral 10/09/2012    Procedure: BILATERAL  RETROGRADE bladder and urethral BIOPSY ;  Surgeon: DMolli Hazard MD;  Location: [facility];  Service: Urology;  Laterality: Bilateral;  BILATERAL RETROGRADE  AND bladder and urethral BIOPSY    . Prostate biopsy N/A 10/09/2012    Procedure: PROSTATIC URETHRAL BIOPSY;  Surgeon: DMolli Hazard MD;  Location: [facility];  Service: Urology;  Laterality: N/A;  PROSTATIC URETHRAL BIOPSY    . Hernia repair Bilateral     Inguinal  . Mediastinoscopy N/A 05/01/2013    Procedure: MEDIASTINOSCOPY;  Surgeon: BGaye Pollack MD;  [facility];  Service: Thoracic;  Laterality: N/A;  . Video bronchoscopy N/A 05/01/2013    Procedure: VIDEO BRONCHOSCOPY;  Surgeon: BGaye Pollack MD;  [facility];  Service: Thoracic;  Laterality: N/A;  . Coronary angioplasty      no stents  . Thorocotomy with lobectomy Left 05/29/2013    Procedure: LEFT THOROCOTOMY WITH LEFT UPPER LOBE LOBECTOMY;  Surgeon: BGaye Pollack MD;  [facility];  Service: Thoracic;  Laterality: Left;  . Abdominal aortic endovascular stent graft N/A 05/07/2014    Procedure: ABDOMINAL AORTIC ENDOVASCULAR STENT GRAFT;  Surgeon: VSerafina Mitchell MD;  [facility];  Service: Vascular;  Laterality: N/A;    Family History  Problem Relation Age of Onset  . Hypertension Mother   . Prostate cancer  Father     Prostate cancer  . Cancer Father     Brain Tumor  . Lung cancer Sister     NON SMOKER  . Cancer Sister   . Hyperlipidemia Sister   . Diabetes Neg Hx   . Stroke Neg Hx   . Hyperlipidemia Brother   . Heart attack Brother   . Hypertension Brother     Social History   Social History  . Marital Status: Married    Spouse Name: N/A  . Number of Children: N/A  . Years of Education: N/A   Occupational History  . Retired Risk analyst    Social History Main Topics  . Smoking status: Current Every Day Smoker -- 1.00 packs/day for 24 years    Types: Cigarettes    Last Attempt to Quit: 05/28/2013  . Smokeless tobacco:  Never Used     Comment: QUIT 15 YEARS AGO  . Alcohol Use: 9.0 oz/week    3 Glasses of wine, 12 Cans of beer per week     Comment: 1-2 beers a day  . Drug Use: No  . Sexual Activity: Not on file   Other Topics Concern  . Not on file   Social History Narrative   HEART HEALTHY DIET.     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   MARRIED, 2 children who live in [place].  Smoked on and off since then.   ALCOHOL USE -YES- RED WINE SOCIALLY, couple of beers at night usually.    Outpatient Encounter Prescriptions as of 09/07/2015  Medication Sig  . amLODipine (NORVASC) 5 MG tablet Take 5 mg by mouth daily.  Marland Kitchen aspirin EC 81 MG tablet Take 81 mg by mouth daily.  . budesonide-formoterol (SYMBICORT) 160-4.5 MCG/ACT inhaler Inhale 1-2 puffs into the lings every 12 hours. Gargle and Spit after use. --- Please establish with new PCP for further refills  . CRESTOR 40 MG tablet TAKE 1 TABLET (40 MG TOTAL) BY MOUTH DAILY.  Marland Kitchen dexamethasone (DECADRON) 4 MG tablet Take 2 tablets (8 mg total) by mouth 2 (two) times daily with a meal.  . Glucosamine-Chondroit-Vit C-Mn (GLUCOSAMINE 1500 COMPLEX PO) Take 1,500 mg by mouth daily.   Marland Kitchen levETIRAcetam (KEPPRA) 500 MG tablet Take 1 tablet (500 mg total) by mouth 2 (two) times daily.  . metoprolol (LOPRESSOR) 50 MG tablet Take 1 tablet (50 mg total) by mouth 2 (two) times daily. --- Pt needs appt for further refills.  . Multiple Vitamin (MULTIVITAMIN) tablet Take 1 tablet by mouth every evening.   . [DISCONTINUED] levETIRAcetam (KEPPRA) 500 MG tablet TAKE ONE TABLET BY MOUTH TWO TIMES DAILY (Patient not taking: Reported on 09/07/2015)  . [DISCONTINUED] LORazepam (ATIVAN) 0.5 MG tablet Take 0.5 mg by mouth once as needed for anxiety. Reported on 09/07/2015   No facility-administered encounter medications on file as of 09/07/2015.    Allergies  Allergen Reactions  . Fluvirin [Influenza Vac Typ A&B Surf Ant] Nausea And Vomiting and Other (See Comments)     Seizure like activity?  Marland Kitchen Iodinated Diagnostic Agents Hives    1 hive on lt cheek lasting approximately 1 hour on last 2 CT per pt; needs pre meds in future; 50 mg benadryl po 1 hr prior to exam per Dr. Weber Cooks    ROS Review of Systems  Constitutional: Positive for fatigue. Negative for fever.  HENT: Negative for congestion and sore throat.   Eyes: Negative for visual disturbance.  Respiratory: Negative for cough.  Cardiovascular: Positive for leg swelling (bilat LE edema). Negative for chest pain.  Gastrointestinal: Negative for nausea and abdominal pain.  Genitourinary: Negative for dysuria.  Musculoskeletal: Negative for back pain and joint swelling.  Skin: Negative for rash.  Neurological: Positive for light-headedness (with intermittent vertigo). Negative for weakness and headaches.  Hematological: Negative for adenopathy.  Psychiatric/Behavioral: Positive for sleep disturbance.    PE; Blood pressure 142/93, pulse 84, temperature 97.9 F (36.6 C), temperature source Oral, resp. rate 16, height '5\' 4"'$  (1.626 m), weight 161 lb 8 oz (73.256 kg), SpO2 95 %. Gen: Alert, well appearing.  Patient is oriented to person, place, time, and situation. Moved a bit slow from chair to exam table but seemingly well balanced and w/out ataxia UXL:KGMW: no injection, icteris, swelling, or exudate.  EOMI, PERRLA.  Corneal light reflex was just a bit inferiorly displaced (just inferior to pupil) on the right, but was centrally located on L eye. Mouth: lips without lesion/swelling.  Oral mucosa pink and moist. Oropharynx without erythema, exudate, or swelling.  Neck: supple/nontender.  No LAD, mass, or TM.  Carotid pulses 1+ bilaterally, without bruits. CV: RRR, no m/r/g.   LUNGS: CTA bilat except he has a bit of nondescript scratchy type adventitious sound in L lung base on inspiration nonlabored resps, good aeration in all lung fields. EXT: 2-3 + pitting edema in both LL's Neuro: no focal weakness  prox or dist.  CN 2-12 grossly intact bilat.  No ataxia.  Speech/cognition normal.   Pertinent labs:  Lab Results  Component Value Date   TSH 0.708 08/05/2015   Lab Results  Component Value Date   WBC 9.7 08/05/2015   HGB 12.8* 08/05/2015   HCT 37.5* 08/05/2015   MCV 86.4 08/05/2015   PLT 204 08/05/2015   Lab Results  Component Value Date   CREATININE 0.74 08/05/2015   BUN 11 08/05/2015   NA 137 08/05/2015   K 3.7 08/05/2015   CL 104 08/05/2015   CO2 25 08/05/2015   Lab Results  Component Value Date   ALT 24 08/05/2015   AST 18 08/05/2015   ALKPHOS 55 08/05/2015   BILITOT 0.8 08/05/2015   Lab Results  Component Value Date   CHOL 137 08/13/2013   Lab Results  Component Value Date   HDL 38.10* 08/13/2013   Lab Results  Component Value Date   LDLCALC 83 08/13/2013   Lab Results  Component Value Date   TRIG 81.0 08/13/2013   Lab Results  Component Value Date   CHOLHDL 4 08/13/2013    ASSESSMENT AND PLAN:   Transfer pt, establishing care.  1) Left lung cancer with hx of L upper lobectomy with mediastinal LN dissection, chemo, and radiation in 2014/2015.  Was under observation with Dr. Julien Nordmann with most recent f/u visit 07/13/15--no signs of recurrence at that time. Now with brain mets detected 08/04/15 when pt had seizure-like activity after getting flu vaccine. Currently is in the midst of palliative brain radiation treatments with Dr. Lisbeth Renshaw, is on a decadron taper and is also on keppra for seizure prophylaxis at this time. His initial brain radiation treatment was 08/25/15 and he was instructed to f/u with Dr. Lisbeth Renshaw in 1 mo. He has some increased fluid retention and sleep difficulty since being on decadron, reassured pt today this should dissipate as decadron dose decreases. Check CBC and CMET today.  2) HTN; The current medical regimen is effective;  continue present plan and medications. Low Na diet emphasized, esp with recent  LE edema.  3)  Hyperlipidemia: tolerating statin.  Needs updated FLP when he is next available for fasting labs (not fasting today).  Spent 35 min with pt today, with >50% of this time spent in counseling and care coordination regarding the above problems.  An After Visit Summary was printed and given to the patient.   Return in about 4 weeks (around 10/05/2015) for routine chronic illness f/u (30 min).

## 2015-09-07 NOTE — Progress Notes (Signed)
Pre visit review using our clinic review tool, if applicable. No additional management support is needed unless otherwise documented below in the visit note. 

## 2015-09-09 ENCOUNTER — Telehealth: Payer: Self-pay | Admitting: Radiation Therapy

## 2015-09-09 ENCOUNTER — Telehealth: Payer: Self-pay | Admitting: *Deleted

## 2015-09-09 NOTE — Telephone Encounter (Signed)
error 

## 2015-09-09 NOTE — Telephone Encounter (Signed)
Returned call after voice mail message left on [place] phone,  About his ankles swelling, patient saw his Primary Md on 09/07/15 and voiced his concerns on this,  He is on a decadron taper and swelling  should decrease in time per MD'S note, suggested that he elevate his legs  Periodically and can buy compression stockings to help with this,MD wouldn't prescribe any diuretics   After speaking with our new PA Shona Simpson, patient thanked this Rn for retuning the call 11:00 AM

## 2015-09-09 NOTE — Telephone Encounter (Signed)
Opened to document, but the nurse had already taken care of this.

## 2015-09-10 ENCOUNTER — Other Ambulatory Visit: Payer: Self-pay | Admitting: Family Medicine

## 2015-09-10 DIAGNOSIS — E875 Hyperkalemia: Secondary | ICD-10-CM

## 2015-09-10 DIAGNOSIS — D696 Thrombocytopenia, unspecified: Secondary | ICD-10-CM

## 2015-09-10 MED ORDER — FUROSEMIDE 20 MG PO TABS
20.0000 mg | ORAL_TABLET | Freq: Every day | ORAL | Status: DC
Start: 2015-09-10 — End: 2015-10-06

## 2015-09-16 ENCOUNTER — Other Ambulatory Visit (INDEPENDENT_AMBULATORY_CARE_PROVIDER_SITE_OTHER): Payer: Medicare Other

## 2015-09-16 DIAGNOSIS — E875 Hyperkalemia: Secondary | ICD-10-CM | POA: Diagnosis not present

## 2015-09-16 DIAGNOSIS — D696 Thrombocytopenia, unspecified: Secondary | ICD-10-CM | POA: Diagnosis not present

## 2015-09-16 LAB — CBC WITH DIFFERENTIAL/PLATELET
Basophils Absolute: 0 10*3/uL (ref 0.0–0.1)
Basophils Relative: 0 % (ref 0.0–3.0)
Eosinophils Absolute: 0 10*3/uL (ref 0.0–0.7)
Eosinophils Relative: 0.3 % (ref 0.0–5.0)
HCT: 43 % (ref 39.0–52.0)
Hemoglobin: 14.3 g/dL (ref 13.0–17.0)
Lymphocytes Relative: 7.7 % — ABNORMAL LOW (ref 12.0–46.0)
Lymphs Abs: 1 10*3/uL (ref 0.7–4.0)
MCHC: 33.2 g/dL (ref 30.0–36.0)
MCV: 86.7 fl (ref 78.0–100.0)
Monocytes Absolute: 0.5 10*3/uL (ref 0.1–1.0)
Monocytes Relative: 4 % (ref 3.0–12.0)
Neutro Abs: 11.4 10*3/uL — ABNORMAL HIGH (ref 1.4–7.7)
Neutrophils Relative %: 88 % — ABNORMAL HIGH (ref 43.0–77.0)
Platelets: 165 10*3/uL (ref 150.0–400.0)
RBC: 4.97 Mil/uL (ref 4.22–5.81)
RDW: 16.2 % — ABNORMAL HIGH (ref 11.5–15.5)
WBC: 12.9 10*3/uL — ABNORMAL HIGH (ref 4.0–10.5)

## 2015-09-16 LAB — COMPREHENSIVE METABOLIC PANEL
ALT: 49 U/L (ref 0–53)
AST: 21 U/L (ref 0–37)
Albumin: 3.5 g/dL (ref 3.5–5.2)
Alkaline Phosphatase: 48 U/L (ref 39–117)
BUN: 30 mg/dL — ABNORMAL HIGH (ref 6–23)
CO2: 33 mEq/L — ABNORMAL HIGH (ref 19–32)
Calcium: 9.1 mg/dL (ref 8.4–10.5)
Chloride: 101 mEq/L (ref 96–112)
Creatinine, Ser: 0.71 mg/dL (ref 0.40–1.50)
GFR: 115.38 mL/min (ref >60.00)
Glucose, Bld: 85 mg/dL (ref 70–99)
Potassium: 4.9 mEq/L (ref 3.5–5.1)
Sodium: 140 mEq/L (ref 135–145)
Total Bilirubin: 0.4 mg/dL (ref 0.2–1.2)
Total Protein: 5.6 g/dL — ABNORMAL LOW (ref 6.0–8.3)

## 2015-09-23 ENCOUNTER — Encounter: Payer: Self-pay | Admitting: Radiation Oncology

## 2015-09-26 ENCOUNTER — Other Ambulatory Visit: Payer: Self-pay | Admitting: Internal Medicine

## 2015-09-27 ENCOUNTER — Telehealth: Payer: Self-pay | Admitting: *Deleted

## 2015-09-27 ENCOUNTER — Encounter: Payer: Self-pay | Admitting: Radiation Oncology

## 2015-09-27 ENCOUNTER — Ambulatory Visit
Admission: RE | Admit: 2015-09-27 | Discharge: 2015-09-27 | Disposition: A | Discharge status: Home / Self Care | Payer: Medicare Other | Source: Ambulatory Visit | Attending: Radiation Oncology | Admitting: Radiation Oncology

## 2015-09-27 VITALS — BP 144/84 | HR 80 | Temp 97.5°F | Resp 16 | Ht 64.0 in | Wt 160.9 lb

## 2015-09-27 DIAGNOSIS — R569 Unspecified convulsions: Secondary | ICD-10-CM

## 2015-09-27 DIAGNOSIS — C3412 Malignant neoplasm of upper lobe, left bronchus or lung: Secondary | ICD-10-CM

## 2015-09-27 DIAGNOSIS — C7931 Secondary malignant neoplasm of brain: Secondary | ICD-10-CM

## 2015-09-27 HISTORY — DX: Personal history of irradiation: Z92.3

## 2015-09-27 NOTE — Progress Notes (Signed)
Radiation Oncology         [phone] ________________________________  Name: Jordan Mccullough MRN: [id number]  Date: 09/27/2015  DOB: [birth year]  Follow-Up Visit Note  CC: Tammi Sou, MD  Gaye Pollack, MD  Diagnosis: Metastatic Lung Cancer    ICD-9-CM ICD-10-CM   1. Seizure-like activity ([place]) 780.39 R56.9 Ambulatory referral to Neurology  2. Brain metastases ([place]) 198.3 C79.31 Ambulatory referral to Neurology  3. Malignant neoplasm of upper lobe of left lung ([facility]) 162.3 C34.12 Ambulatory referral to Neurology    Interval Since Last Radiation:  1 months.  08/25/15: 1.  PTV1 Rt Vertex Frontoparietal 3m target was treated using 4 Arcs to a prescription dose of 18 Gy. ExacTrac Snap verification was performed for each couch angle.  2.  PTV2  Rt Posterior Frontal Lobe 15mtarget was treated using 4 Arcs to a prescription dose of 20 Gy. ExacTrac Snap verification was performed for each couch angle.  11/10/2013 through 12/12/2013: The patient was treated to the high-risk region within the central chest/mediastinum. The patient was treated with a 3-D conformal technique with daily image guidance. The patient received 50 gray in 25 fractions.  Narrative:  The patient returns today for routine follow-up.    On review of systems the patient reports that he is doing very well overall , he feels still extensive 5 cm resistive Keppra. He feels that has not worsened since completing his SRS treatment. Also has noticed low back pain and describes this is a 4 out of 10. His wife is concerned that this is due to inactivity and spending much of the day in a recliner chair. He states that his lower extremity weakness  Has had been somewhat concerned but has just  finished taking dexamethasone over the weekend.   He denies any chest pain or shortness of breath. He denies any double vision but states that he has had some blurred vision since completing his radiation therapy. He is not experiencing  nausea, vomiting, or auditory disturbances. A complete review of systems is obtained and is otherwise negative.                       ALLERGIES:  is allergic to fluvirin and iodinated diagnostic agents.  Meds: Current Outpatient Prescriptions  Medication Sig Dispense Refill  . amLODipine (NORVASC) 5 MG tablet Take 5 mg by mouth daily.    . Marland Kitchenspirin EC 81 MG tablet Take 81 mg by mouth daily.    . budesonide-formoterol (SYMBICORT) 160-4.5 MCG/ACT inhaler Inhale 1-2 puffs into the lings every 12 hours. Gargle and Spit after use. --- Please establish with new PCP for further refills 10.2 Inhaler 0  . CRESTOR 40 MG tablet TAKE 1 TABLET (40 MG TOTAL) BY MOUTH DAILY. 30 tablet 5  . Glucosamine-Chondroit-Vit C-Mn (GLUCOSAMINE 1500 COMPLEX PO) Take 1,500 mg by mouth daily.     . Marland KitchenevETIRAcetam (KEPPRA) 500 MG tablet Take 1 tablet (500 mg total) by mouth 2 (two) times daily. 60 tablet 6  . metoprolol (LOPRESSOR) 50 MG tablet Take 1 tablet (50 mg total) by mouth 2 (two) times daily. --- Pt needs appt for further refills. 180 tablet 0  . Multiple Vitamin (MULTIVITAMIN) tablet Take 1 tablet by mouth every evening.     . Marland Kitchenexamethasone (DECADRON) 4 MG tablet Take 2 tablets (8 mg total) by mouth 2 (two) times daily with a meal. (Patient not taking: Reported on 09/27/2015) 90 tablet 0  . furosemide (LASIX) 20 MG  tablet Take 1 tablet (20 mg total) by mouth daily. (Patient not taking: Reported on 09/27/2015) 5 tablet 0   No current facility-administered medications for this encounter.    Physical Findings:  height is _0  (1.626 m) and weight is 160 lb 14.4 oz (72.984 kg). His oral temperature is 97.5 F (36.4 C). His blood pressure is 144/84 and his pulse is 80. His respiration is 16 and oxygen saturation is 98%.   Pain scale 4/10 In general this is a well-appearing Caucasian male in no acute distress. He is alert and oriented times for appropriate throughout the examination.  HEENT reveals normocephalic  atraumatic EOMs are intact, oral mucosa is intact without evidence of plaques or lesions.Cardiopulmonary assessment reveals a normal effort in no acute distress. Lower extremities are significant for 1+ pitting edema of the dorsal aspect of the left foot , negative on the right. No deep calf tenderness is appreciated no clubbing or cyanosis is otherwise appreciated.  Lab Findings: Lab Results  Component Value Date   WBC 12.9* 09/16/2015   WBC 6.8 07/07/2015   HGB 14.3 09/16/2015   HGB 15.0 07/07/2015   HCT 43.0 09/16/2015   HCT 44.8 07/07/2015   PLT 165.0 09/16/2015   PLT 265 07/07/2015    Lab Results  Component Value Date   NA 140 09/16/2015   NA 141 07/07/2015   K 4.9 09/16/2015   K 5.0 07/07/2015   CHLORIDE 105 07/07/2015   CO2 33* 09/16/2015   CO2 28 07/07/2015   GLUCOSE 85 09/16/2015   GLUCOSE 100 07/07/2015   BUN 30* 09/16/2015   BUN 15.1 07/07/2015   CREATININE 0.71 09/16/2015   CREATININE 1.0 07/07/2015   CREATININE 0.78 04/09/2014   BILITOT 0.4 09/16/2015   BILITOT 0.34 07/07/2015   ALKPHOS 48 09/16/2015   ALKPHOS 67 07/07/2015   AST 21 09/16/2015   AST 21 07/07/2015   ALT 49 09/16/2015   ALT 23 07/07/2015   PROT 5.6* 09/16/2015   PROT 7.1 07/07/2015   ALBUMIN 3.5 09/16/2015   ALBUMIN 3.7 07/07/2015   CALCIUM 9.1 09/16/2015   CALCIUM 10.0 07/07/2015   ANIONGAP 8 08/05/2015   ANIONGAP 8 07/07/2015    Radiographic Findings: No results found.  Impression:   Stage IIIA (T2a., N2, M0) non-small cell lung cancer adenocarcinoma with negative EGFR mutation and negative ALK gene translocation involving the left upper lobe middle, s/p SRS to metastatic brain lesions on 08/25/15  Plan:   The patient appears to be doing  pretty well overall since bleeding his radiation therapy. He needs to have some question about whether or not Keppra can be causing his mental fogginess which notably started prior to his therapy was felt by neurosurgery to possibly be the source of  his symptoms. We will refer him to Dr. Tomi Likens in neurology for further assessment and counseling as to whether or not the patient could've tapered down his Keppra , and duration he will need to remain on this as his ECG was negative despite presumed seizure activity on his presentation. We will continue to follow him very closely and  I have discussed the role of T3 MRI , which we will order in 2 months time.  We will see him back to review these results. Moving forward we will perform these MRIs every 3 months for at least the first year after completing his SRS therapy. The patient is counseled to contact us if he is having any increasing symptoms prior to his next visit.  He will follow-up with his primary provider for further evaluation of his back pain, and we have encouraged him to try light exercise for strengthening his lower extremities and core.  We expect that his symptoms of trouble sleeping and lower extremity weakness are results of his long taper of dexamethasone. We will continue to follow this closely.  Shona Simpson, [facility]  This document serves as a record of services personally performed by Shona Simpson, PAC. It was created on her behalf by Darcus Austin, a trained medical scribe. The creation of this record is based on the scribe's personal observations and the provider's statements to them. This document has been checked and approved by the attending provider.

## 2015-09-27 NOTE — Telephone Encounter (Signed)
CALLED PATIENT TO INFORM OF APPT. WITH DR. Ellouise Newer ON 10-11-15 @ 12:30 PM, LVM FOR A RETURN CALL

## 2015-09-27 NOTE — Progress Notes (Signed)
folow up s/p SRS Brain 08/25/15, completed  Yesterday, still taking Keppra 500 mg bid, c/o blurred vi soon since started the keppra and decadron,   No head aches, or nausea, is c/o low back pain when standing  Up, sitting down or lyi9ng no pain, wakes up a lot atnight, only getting 4 hours sleep at night, cat naps during the day, appetite great , no thrush seen in mouth or tongue 8:50 AM BP 144/84 mmHg  Pulse 80  Temp(Src) 97.5 F (36.4 C) (Oral)  Resp 16  Ht '5\' 4"'$  (1.626 m)  Wt 160 lb 14.4 oz (72.984 kg)  BMI 27.60 kg/m2  SpO2 98%  Wt Readings from Last 3 Encounters:  09/27/15 160 lb 14.4 oz (72.984 kg)  09/07/15 161 lb 8 oz (73.256 kg)  08/13/15 146 lb 8 oz (66.452 kg)

## 2015-09-27 NOTE — Patient Instructions (Signed)
Contact our office if you have any questions following today's appointment: [phone].  

## 2015-10-02 ENCOUNTER — Other Ambulatory Visit: Payer: Self-pay | Admitting: Internal Medicine

## 2015-10-04 NOTE — Telephone Encounter (Signed)
Patient is establishing care with you---sending rx refill request ---

## 2015-10-06 ENCOUNTER — Encounter: Payer: Self-pay | Admitting: Family Medicine

## 2015-10-06 ENCOUNTER — Ambulatory Visit (INDEPENDENT_AMBULATORY_CARE_PROVIDER_SITE_OTHER): Payer: Medicare Other | Admitting: Family Medicine

## 2015-10-06 VITALS — BP 143/88 | HR 89 | Temp 97.9°F | Resp 16 | Ht 64.0 in | Wt 158.2 lb

## 2015-10-06 DIAGNOSIS — C7931 Secondary malignant neoplasm of brain: Secondary | ICD-10-CM

## 2015-10-06 DIAGNOSIS — I10 Essential (primary) hypertension: Secondary | ICD-10-CM | POA: Diagnosis not present

## 2015-10-06 DIAGNOSIS — C3412 Malignant neoplasm of upper lobe, left bronchus or lung: Secondary | ICD-10-CM

## 2015-10-06 DIAGNOSIS — Z23 Encounter for immunization: Secondary | ICD-10-CM | POA: Diagnosis not present

## 2015-10-06 MED ORDER — LEVETIRACETAM 500 MG PO TABS: ORAL_TABLET | ORAL | Status: DC

## 2015-10-06 NOTE — Progress Notes (Signed)
OFFICE VISIT  10/06/2015   CC:  Chief Complaint  Patient presents with  . Follow-up    Pt is not fasting.      HPI:    Patient is a 75 y.o. Caucasian male who presents for here for 1 mo f/u HTN, lung ca with brain mets, recent hx of LE edema secondary to fluid retention caused by systemic steroids. It has now been 42mosince he was dx'd with brain mets from his lung cancer.  He continues to smoke 1 pack cigs per day. He complains of sedation and generalized bilat leg weakness on the keppra and asks if dose can be lowered.  He has first appt in 4-5 days with neurologist. He has not had any seizures since December when he went in hosp and was dx'd with brain mets.  Past Medical History  Diagnosis Date  . Hyperlipidemia   . Diverticulosis   . Coronary artery disease     MI 1992, S/P  PTCA; negative stress test in November 2011 with no ischemia.   . [place]    4.8x4.6 cm infrarenal abdominal aortic fusiform aneurysm, 1.5 cm right common iliac artery aneurysm  . [facility]   . Hearing loss     Bilateral   . Myocardial infarction ([facility] 1992    Dr MAngelena Form   . History of radiation therapy 11/10/13-12/12/13    lung,50Gy/227f . [place]    non-small cell;  L upper lobectomy with mediastinal LN dissection, chemo, and radiation in 2014/2015. Remission until pt had questionable seizure 07/2015--MRI showed brain mets; palliative brain rad (stereotactic radiosurgery) started 08/25/15  . Hypertension   . Shortness of breath   . Tobacco dependence     "Quit" 1992, but pt has smoked "on and off" since that time  . Asymptomatic cholelithiasis 07/2015    Incidental finding on PET CT  . S/P radiation therapy SRS 08/25/15    frontoparietal 18gy,posterior frintal lobe 20gy,    Past Surgical History  Procedure Laterality Date  . Ptca  1992  . Inguinal heriiorrhaphy bilaterally    . Colonoscopy w/ polypectomy  2003      negative 2010,due 2020; Dr BrOlevia Perches. Pilonidal cyst excision    . Rotator cuff repair      Bilateral  . Cystoscopy/retrograde/ureteroscopy Bilateral 10/09/2012    Procedure: BILATERAL RETROGRADE bladder and urethral BIOPSY ;  Surgeon: DaMolli HazardMD;  [facility];  Service: Urology;  Laterality: Bilateral;  BILATERAL RETROGRADE   . Prostate biopsy N/A 10/09/2012    Procedure: PROSTATIC URETHRAL BIOPSY;  Surgeon: DaMolli HazardMD;  [facility];  Service: Urology;  Laterality: N/A;  PROSTATIC URETHRAL BIOPSY   . Hernia repair Bilateral     Inguinal  . Mediastinoscopy N/A 05/01/2013    Procedure: MEDIASTINOSCOPY;  Surgeon: BrGaye PollackMD;  [facility];  Service: Thoracic;  Laterality: N/A;  . Video bronchoscopy N/A 05/01/2013    Procedure: VIDEO BRONCHOSCOPY;  Surgeon: BrGaye PollackMD;  [facility];  Service: Thoracic;  Laterality: N/A;  . Coronary angioplasty      no stents  . Thorocotomy with lobectomy Left 05/29/2013    Procedure: LEFT THOROCOTOMY WITH LEFT UPPER LOBE LOBECTOMY;  Surgeon: BrGaye PollackMD;  [facility];  Service: Thoracic;  Laterality: Left;  . Abdominal aortic endovascular stent graft N/A 05/07/2014    Procedure: ABDOMINAL AORTIC ENDOVASCULAR STENT GRAFT;  Surgeon: Serafina Mitchell, MD;  Location: [facility] OR;  Service: Vascular;  Laterality: N/A;  . Eeg  08/05/15    Pt placed on keppra just prior to this test due to having ? seizure (MRI showed brain mets)  . Pfts  04/2013    Minimal obstructive airway disease  . Carotid duplex dopplers  07/2015    1-39% on R, no signif dz noted on L    Outpatient Prescriptions Prior to Visit  Medication Sig Dispense Refill  . amLODipine (NORVASC) 5 MG tablet TAKE 1 TABLET (5 MG TOTAL) BY MOUTH DAILY. 30 tablet 11  . aspirin EC 81 MG tablet Take 81 mg by mouth daily.    . budesonide-formoterol (SYMBICORT) 160-4.5 MCG/ACT inhaler Inhale 1-2 puffs into the lings every 12 hours. Gargle and Spit  after use. --- Please establish with new PCP for further refills 10.2 Inhaler 0  . CRESTOR 40 MG tablet TAKE 1 TABLET (40 MG TOTAL) BY MOUTH DAILY. 30 tablet 5  . Glucosamine-Chondroit-Vit C-Mn (GLUCOSAMINE 1500 COMPLEX PO) Take 1,500 mg by mouth daily.     . metoprolol (LOPRESSOR) 50 MG tablet Take 1 tablet (50 mg total) by mouth 2 (two) times daily. --- Pt needs appt for further refills. 180 tablet 0  . Multiple Vitamin (MULTIVITAMIN) tablet Take 1 tablet by mouth every evening.     . levETIRAcetam (KEPPRA) 500 MG tablet Take 1 tablet (500 mg total) by mouth 2 (two) times daily. 60 tablet 6  . amLODipine (NORVASC) 5 MG tablet Take 5 mg by mouth daily. Reported on 10/06/2015    . dexamethasone (DECADRON) 4 MG tablet Take 2 tablets (8 mg total) by mouth 2 (two) times daily with a meal. (Patient not taking: Reported on 09/27/2015) 90 tablet 0  . furosemide (LASIX) 20 MG tablet Take 1 tablet (20 mg total) by mouth daily. (Patient not taking: Reported on 09/27/2015) 5 tablet 0   No facility-administered medications prior to visit.    Allergies  Allergen Reactions  . Fluvirin [Influenza Vac Typ A&B Surf Ant] Nausea And Vomiting and Other (See Comments)    Seizure like activity?  Marland Kitchen Iodinated Diagnostic Agents Hives    1 hive on lt cheek lasting approximately 1 hour on last 2 CT per pt; needs pre meds in future; 50 mg benadryl po 1 hr prior to exam per Dr. Weber Cooks    ROS As per HPI  PE: Blood pressure 143/88, pulse 89, temperature 97.9 F (36.6 C), temperature source Oral, resp. rate 16, height '5\' 4"'$  (1.626 m), weight 158 lb 4 oz (71.782 kg), SpO2 96 %. Gen: Alert, well appearing.  Patient is oriented to person, place, time, and situation. AFFECT: pleasant, lucid thought and speech. CV: RRR, no m/r/g.   LUNGS: CTA bilat, nonlabored resps, good aeration in all lung field, with mild decreased BS on L lower lung region posteriorly EXT: no clubbing, cyanosis, or edema.    LABS:    Chemistry       Component Value Date/Time   NA 140 09/16/2015 0927   NA 141 07/07/2015 1250   K 4.9 09/16/2015 0927   K 5.0 07/07/2015 1250   CL 101 09/16/2015 0927   CO2 33* 09/16/2015 0927   CO2 28 07/07/2015 1250   BUN 30* 09/16/2015 0927   BUN 15.1 07/07/2015 1250   CREATININE 0.71 09/16/2015 0927   CREATININE 1.0 07/07/2015 1250   CREATININE 0.78 04/09/2014 0953      Component Value Date/Time   CALCIUM  9.1 09/16/2015 0927   CALCIUM 10.0 07/07/2015 1250   ALKPHOS 48 09/16/2015 0927   ALKPHOS 67 07/07/2015 1250   AST 21 09/16/2015 0927   AST 21 07/07/2015 1250   ALT 49 09/16/2015 0927   ALT 23 07/07/2015 1250   BILITOT 0.4 09/16/2015 0927   BILITOT 0.34 07/07/2015 1250     Lab Results  Component Value Date   WBC 12.9* 09/16/2015   HGB 14.3 09/16/2015   HCT 43.0 09/16/2015   MCV 86.7 09/16/2015   PLT 165.0 09/16/2015    IMPRESSION AND PLAN:  1) Lung ca with brain mets: he has had one stereotactic radiosurgery tx.  Per pt report, plan is to wait 3 mo from the time of that tx and get a repeat brain MRI--this will be the first week of April 2017.   He is finished with systemic steroids and his peripheral edema has resolved. He is on keppra 500 mg bid for seizure prophylaxis and I have agreed to decrease his dose to 1/2 of the 500 mg tab bid b/c of the excessive side effects he feels on this med.  He'll report to his neurologist how this change has made him feel. I encouraged smoking cessation today but pt not contemplating quitting trial at this time.  2) HTN: The current medical regimen is effective;  continue present plan and medications. Lytes/cr good 1 mo ago.  3) Prev health care: due for Tdap and prevnar 13.  I gave prevnar 13 today and we'll save Tdap for another time.  An After Visit Summary was printed and given to the patient.  FOLLOW UP: Return in about 3 months (around 01/03/2016) for routine chronic illness f/u.

## 2015-10-06 NOTE — Addendum Note (Signed)
Addended by: Onalee Hua on: 10/06/2015 02:02 PM   Modules accepted: Orders

## 2015-10-06 NOTE — Progress Notes (Signed)
Pre visit review using our clinic review tool, if applicable. No additional management support is needed unless otherwise documented below in the visit note. 

## 2015-10-07 ENCOUNTER — Ambulatory Visit: Payer: Medicare Other | Admitting: Family Medicine

## 2015-10-11 ENCOUNTER — Encounter: Payer: Self-pay | Admitting: Neurology

## 2015-10-11 ENCOUNTER — Ambulatory Visit (INDEPENDENT_AMBULATORY_CARE_PROVIDER_SITE_OTHER): Payer: Medicare Other | Admitting: Neurology

## 2015-10-11 VITALS — BP 130/88 | HR 86 | Ht 64.0 in | Wt 159.1 lb

## 2015-10-11 DIAGNOSIS — G40209 Localization-related (focal) (partial) symptomatic epilepsy and epileptic syndromes with complex partial seizures, not intractable, without status epilepticus: Secondary | ICD-10-CM | POA: Diagnosis not present

## 2015-10-11 DIAGNOSIS — C7931 Secondary malignant neoplasm of brain: Secondary | ICD-10-CM

## 2015-10-11 MED ORDER — LEVETIRACETAM ER 500 MG PO TB24: ORAL_TABLET | ORAL | Status: DC

## 2015-10-11 NOTE — Patient Instructions (Signed)
1. Start Levetiracetam ER '500mg'$ : Take 1 tablet at night 2. Stop your current prescription of '250mg'$  twice a day 3. Follow-up in 1 month, call for any changes  Seizure Precautions: 1. If medication has been prescribed for you to prevent seizures, take it exactly as directed.  Do not stop taking the medicine without talking to your doctor first, even if you have not had a seizure in a long time.   2. Avoid activities in which a seizure would cause danger to yourself or to others.  Don't operate dangerous machinery, swim alone, or climb in high or dangerous places, such as on ladders, roofs, or girders.  Do not drive unless your doctor says you may.  3. If you have any warning that you may have a seizure, lay down in a safe place where you can't hurt yourself.    4.  No driving for 6 months from last seizure, as per [facility].   Please refer to the following link on the [place] website for more information: http://www.epilepsyfoundation.org/answerplace/Social/driving/drivingu.cfm   5.  Maintain good sleep hygiene. Avoid alcohol.  6.  Contact your doctor if you have any problems that may be related to the medicine you are taking.  7.  Call 911 and bring the patient back to the ED if:        A.  The seizure lasts longer than 5 minutes.       B.  The patient doesn't awaken shortly after the seizure  C.  The patient has new problems such as difficulty seeing, speaking or moving  D.  The patient was injured during the seizure  E.  The patient has a temperature over 102 F (39C)  F.  The patient vomited and now is having trouble breathing

## 2015-10-11 NOTE — Progress Notes (Signed)
NEUROLOGY CONSULTATION NOTE  Jordan Mccullough MRN: [id number] DOB: 1941/08/04  Referring provider: Dr. Kyung Rudd Primary care provider: Dr. Shawnie Dapper  Reason for consult:  New onset seizure  Dear Dr Anitra Lauth:  Thank you for your kind referral of Jordan Mccullough for consultation of the above symptoms. Although his history is well known to you, please allow me to reiterate it for the purpose of our medical record. The patient was accompanied to the clinic by his wife who also provides collateral information. Records and images were personally reviewed where available.  HISTORY OF PRESENT ILLNESS: This is a pleasant 75 year old right-handed man with a history of lung cancer s/p lobectomy, chemotherapy and radiation, hyperlipidemia, CAD s/p MI, peripheral vascular disease, abdominal aortic aneurysm, admitted [facility] 08/04[facility]15 after a new onset seizure and found to have brain metastases. He states he got his flu shot then went to work, got home and started having a beer the threw up, his eyes got blurry to the point he could not see anything, then has no recollection of events. According to ER notes, after the nausea and vomiting, he decided to drink beer to feel better, his wife left the room then came back to find him with arms extended, shaking, eyes rolled back lasting 5-10 seconds. He recalls coming to without feeling confused, no focal weakness. He was brought to [facility] ER where CT head showed a 1.6 x 1.5 cm hyperdense mass in the right frontal lobe worrisome for focal metastases. MRI was done showing 2 enhancing masses in the right frontal lobe measuring up to 2 x 1.7 cm, no mass effect. He was started on Keppra '500mg'$  BID with no further seizures. He started stereotactic radiation, with plan for repeat MRI in April.   He denies any further seizures since then. He denies any prior history of seizures. His wife denies any staring/unresponsive episodes, no gaps in time, olfactory/gustatory  hallucinations, deja vu, rising epigastric sensation, focal numbness/tingling/weakness, myoclonic jerks, headaches, dysarthria/dysphagia, bowel/bladder dysfunction. He was discharged home on Decadron and Keppra, and reports that he was "just totally out of it," functioning only 50%. He felt significantly better getting of Decadron, but continued to have generalized weakness, particularly in the legs, blurred vision, and reported this to his PCP Dr. Anitra Lauth. Keppra dose was reduced to '250mg'$  BID 5 days ago, and he reports that symptoms are better, but still there. He states that he wakes up feeling fine, but then starts feeling the symptoms after taking morning Keppra dose. He has mild 2-3 over 10 back pain when walking, relieved when he sits down. He continues to drink beer daily but has reduced to 1-2 a day from 3-5 beers a day. His wife reports he would drink 3 beer cases in a week.  Epilepsy Risk Factors:  Brain metastases in the right frontal lobe. Otherwise he had a normal birth and early development.  There is no history of febrile convulsions, CNS infections such as meningitis/encephalitis, significant traumatic brain injury, neurosurgical procedures, or family history of seizures.   I personally reviewed MRI brain with and without contrast done 08/12/15 which showed an 8x52m centrally necrotic metastasis in the right posterior frontal lobe; there is a centrally necrotic mass measuring 172mat the right vertex frontoparietal junction with regional vasogenic edema. Routine EEG 08/05/15 was a normal wake and sleep study  Laboratory Data:  Lab Results  Component Value Date   WBC 12.9* 09/16/2015   HGB 14.3 09/16/2015  HCT 43.0 09/16/2015   MCV 86.7 09/16/2015   PLT 165.0 09/16/2015     Chemistry      Component Value Date/Time   NA 140 09/16/2015 0927   NA 141 07/07/2015 1250   K 4.9 09/16/2015 0927   K 5.0 07/07/2015 1250   CL 101 09/16/2015 0927   CO2 33* 09/16/2015 0927   CO2 28  07/07/2015 1250   BUN 30* 09/16/2015 0927   BUN 15.1 07/07/2015 1250   CREATININE 0.71 09/16/2015 0927   CREATININE 1.0 07/07/2015 1250   CREATININE 0.78 04/09/2014 0953      Component Value Date/Time   CALCIUM 9.1 09/16/2015 0927   CALCIUM 10.0 07/07/2015 1250   ALKPHOS 48 09/16/2015 0927   ALKPHOS 67 07/07/2015 1250   AST 21 09/16/2015 0927   AST 21 07/07/2015 1250   ALT 49 09/16/2015 0927   ALT 23 07/07/2015 1250   BILITOT 0.4 09/16/2015 0927   BILITOT 0.34 07/07/2015 1250       PAST MEDICAL HISTORY: Past Medical History  Diagnosis Date  . Hyperlipidemia   . Diverticulosis   . Coronary artery disease     MI 1992, S/P  PTCA; negative stress test in November 2011 with no ischemia.   . Peripheral vascular disease ([place]) 1/14    4.8x4.6 cm infrarenal abdominal aortic fusiform aneurysm, 1.5 cm right common iliac artery aneurysm  . AAA (abdominal aortic aneurysm) ([place])   . Hearing loss     Bilateral   . Myocardial infarction [facility]) 1992    Dr Angelena Form    . History of radiation therapy 11/10/13-12/12/13    lung,50Gy/65f  . Lung cancer ([place]    non-small cell;  L upper lobectomy with mediastinal LN dissection, chemo, and radiation in 2014/2015. Remission until pt had questionable seizure 07/2015--MRI showed brain mets; palliative brain rad (stereotactic radiosurgery) started 08/25/15  . Hypertension   . Shortness of breath   . Tobacco dependence     "Quit" 1992, but pt has smoked "on and off" since that time  . Asymptomatic cholelithiasis 07/2015    Incidental finding on PET CT  . S/P radiation therapy SRS 08/25/15    frontoparietal 18gy,posterior frintal lobe 20gy,    PAST SURGICAL HISTORY: Past Surgical History  Procedure Laterality Date  . Ptca  1992  . Inguinal heriiorrhaphy bilaterally    . Colonoscopy w/ polypectomy  2003     negative 2010,due 2020; Dr BOlevia Perches . Pilonidal cyst excision    . Rotator cuff repair      Bilateral  .  Cystoscopy/retrograde/ureteroscopy Bilateral 10/09/2012    Procedure: BILATERAL RETROGRADE bladder and urethral BIOPSY ;  Surgeon: DMolli Hazard MD;  [facility];  Service: Urology;  Laterality: Bilateral;  BILATERAL RETROGRADE   . Prostate biopsy N/A 10/09/2012    Procedure: PROSTATIC URETHRAL BIOPSY;  Surgeon: DMolli Hazard MD;  [facility];  Service: Urology;  Laterality: N/A;  PROSTATIC URETHRAL BIOPSY   . Hernia repair Bilateral     Inguinal  . Mediastinoscopy N/A 05/01/2013    Procedure: MEDIASTINOSCOPY;  Surgeon: BGaye Pollack MD;  [facility];  Service: Thoracic;  Laterality: N/A;  . Video bronchoscopy N/A 05/01/2013    Procedure: VIDEO BRONCHOSCOPY;  Surgeon: BGaye Pollack MD;  [facility];  Service: Thoracic;  Laterality: N/A;  . Coronary angioplasty      no stents  . Thorocotomy with lobectomy Left 05/29/2013    Procedure: LEFT THOROCOTOMY WITH LEFT UPPER  LOBE LOBECTOMY;  Surgeon: Gaye Pollack, MD;  Location: [place];  Service: Thoracic;  Laterality: Left;  . Abdominal aortic endovascular stent graft N/A 05/07/2014    Procedure: ABDOMINAL AORTIC ENDOVASCULAR STENT GRAFT;  Surgeon: Serafina Mitchell, MD;  [facility];  Service: Vascular;  Laterality: N/A;  . Eeg  08/05/15    Pt placed on keppra just prior to this test due to having ? seizure (MRI showed brain mets)  . Pfts  04/2013    Minimal obstructive airway disease  . Carotid duplex dopplers  07/2015    1-39% on R, no signif dz noted on L    MEDICATIONS: Current Outpatient Prescriptions on File Prior to Visit  Medication Sig Dispense Refill  . amLODipine (NORVASC) 5 MG tablet TAKE 1 TABLET (5 MG TOTAL) BY MOUTH DAILY. 30 tablet 11  . aspirin EC 81 MG tablet Take 81 mg by mouth daily.    . budesonide-formoterol (SYMBICORT) 160-4.5 MCG/ACT inhaler Inhale 1-2 puffs into the lings every 12 hours. Gargle and Spit after use. --- Please establish with new PCP for further refills 10.2 Inhaler 0    . CRESTOR 40 MG tablet TAKE 1 TABLET (40 MG TOTAL) BY MOUTH DAILY. 30 tablet 5  . Glucosamine-Chondroit-Vit C-Mn (GLUCOSAMINE 1500 COMPLEX PO) Take 1,500 mg by mouth daily.     Marland Kitchen levETIRAcetam (KEPPRA) 500 MG tablet 1/2 tab po bid 60 tablet 6  . metoprolol (LOPRESSOR) 50 MG tablet Take 1 tablet (50 mg total) by mouth 2 (two) times daily. --- Pt needs appt for further refills. 180 tablet 0  . Multiple Vitamin (MULTIVITAMIN) tablet Take 1 tablet by mouth every evening.      No current facility-administered medications on file prior to visit.    ALLERGIES: Allergies  Allergen Reactions  . Fluvirin [Influenza Vac Typ A&B Surf Ant] Nausea And Vomiting and Other (See Comments)    Seizure like activity?  Marland Kitchen Iodinated Diagnostic Agents Hives    1 hive on lt cheek lasting approximately 1 hour on last 2 CT per pt; needs pre meds in future; 50 mg benadryl po 1 hr prior to exam per Dr. Weber Cooks    FAMILY HISTORY: Family History  Problem Relation Age of Onset  . Hypertension Mother   . Prostate cancer Father     Prostate cancer  . Cancer Father     Brain Tumor  . Lung cancer Sister     NON SMOKER  . Cancer Sister   . Hyperlipidemia Sister   . Diabetes Neg Hx   . Stroke Neg Hx   . Hyperlipidemia Brother   . Heart attack Brother   . Hypertension Brother     SOCIAL HISTORY: Social History   Social History  . Marital Status: Married    Spouse Name: N/A  . Number of Children: N/A  . Years of Education: N/A   Occupational History  . Retired Risk analyst    Social History Main Topics  . Smoking status: Current Every Day Smoker -- 1.00 packs/day for 24 years    Types: Cigarettes    Last Attempt to Quit: 05/28/2013  . Smokeless tobacco: Never Used     Comment: QUIT 15 YEARS AGO  . Alcohol Use: 9.0 oz/week    3 Glasses of wine, 12 Cans of beer per week     Comment: 1-2 beers a day  . Drug Use: No  . Sexual Activity: Not on file   Other Topics Concern  . Not on  file    Social History Narrative   HEART HEALTHY DIET.     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   MARRIED, 2 children who live in [place].  Smoked on and off since then.   ALCOHOL USE -YES- RED WINE SOCIALLY, couple of beers at night usually.    REVIEW OF SYSTEMS: Constitutional: No fevers, chills, or sweats, no generalized fatigue, change in appetite Eyes: as above Ear, nose and throat: No hearing loss, ear pain, nasal congestion, sore throat Cardiovascular: No chest pain, palpitations Respiratory:  No shortness of breath at rest or with exertion, wheezes GastrointestinaI: No nausea, vomiting, diarrhea, abdominal pain, fecal incontinence Genitourinary:  No dysuria, urinary retention or frequency Musculoskeletal:  No neck pain, +back pain when walking Integumentary: No rash, pruritus, skin lesions Neurological: as above Psychiatric: No depression, insomnia, anxiety Endocrine: No palpitations, fatigue, diaphoresis, mood swings, change in appetite, change in weight, increased thirst Hematologic/Lymphatic:  No anemia, purpura, petechiae. Allergic/Immunologic: no itchy/runny eyes, nasal congestion, recent allergic reactions, rashes  PHYSICAL EXAM: Filed Vitals:   10/11/15 1240  BP: 130/88  Pulse: 86   General: No acute distress Head:  Normocephalic/atraumatic Eyes: Fundoscopic exam shows bilateral sharp discs, no vessel changes, exudates, or hemorrhages Neck: supple, no paraspinal tenderness, full range of motion Back: No paraspinal tenderness Heart: regular rate and rhythm Lungs: Clear to auscultation bilaterally. Vascular: No carotid bruits. Skin/Extremities: No rash, no edema Neurological Exam: Mental status: alert and oriented to person, place, and time, no dysarthria or aphasia, Fund of knowledge is appropriate.  Recent and remote memory are intact. 2/3 delayed recall.  Attention and concentration are normal.    Able to name objects and repeat phrases. Cranial  nerves: CN I: not tested CN II: pupils equal, round and reactive to light, visual fields intact, fundi unremarkable. CN III, IV, VI:  full range of motion, no nystagmus, no ptosis CN V: facial sensation intact CN VII: upper and lower face symmetric CN VIII: hearing intact to finger rub CN IX, X: gag intact, uvula midline CN XI: sternocleidomastoid and trapezius muscles intact CN XII: tongue midline Bulk & Tone: normal, no fasciculations. Motor: 5/5 throughout with no pronator drift. Sensation: intact to light touch, cold, pin, vibration and joint position sense.  No extinction to double simultaneous stimulation.  Romberg test negative Deep Tendon Reflexes: +2 throughout, no ankle clonus Plantar responses: downgoing bilaterally Cerebellar: no incoordination on finger to nose, heel to shin. No dysdiadochokinesia Gait: narrow-based and steady, able to tandem walk adequately. Tremor: none  IMPRESSION: This is a pleasant 75 year old right-handed man with a history of lung cancer s/p lobectomy, radiation, chemotherapy, found to have 2 right frontal brain metastases after he had a seizure on 08/04/15. Routine EEG normal. He underwent stereotactic radiation therapy with follow-up scan scheduled for April. He reports feeling weak in his legs and double vision on the Keppra, improved with reducing dose to '250mg'$  BID. He denies any seizures since December 2016. We discussed options to switch to extended-release Keppra '500mg'$  qhs and monitor if side effects improve on once a day dosing, versus switching to a different medication such as Lamotrigine. He does need to be on seizure medication at this point with increased risk of recurrent seizures due to brain mets. He would like to try switching to XR formulation first, and if side effects are unchanged, he will be started on Lamotrigine on his next visit. We discussed avoidance of seizure triggers, including missing medications, sleep deprivation,  and  particularly alcohol use. He expressed understanding. [place] driving laws were discussed with the patient, and he knows to stop driving after a seizure, until 6 months seizure-free. He will follow-up after 1 month and knows to call for any problems.   Thank you for allowing me to participate in the care of this patient. Please do not hesitate to call for any questions or concerns.   Ellouise Newer, M.D.  CC: Dr. Anitra Lauth, Dr. Lisbeth Renshaw

## 2015-10-16 ENCOUNTER — Encounter: Payer: Self-pay | Admitting: Family Medicine

## 2015-10-18 ENCOUNTER — Telehealth: Payer: Self-pay | Admitting: *Deleted

## 2015-10-18 MED ORDER — AMLODIPINE BESYLATE 5 MG PO TABS: ORAL_TABLET | ORAL | Status: DC

## 2015-10-18 MED ORDER — METOPROLOL TARTRATE 50 MG PO TABS: 50.0000 mg | ORAL_TABLET | Freq: Two times a day (BID) | ORAL | Status: DC

## 2015-10-18 NOTE — Telephone Encounter (Signed)
RF request for metoprolol LOV: 10/06/15 Next ov: 01/03/16 Last written: 07/14/15 #180 w/ 0RF  Rx for amlodipine was sent in on 10/04/15 for #30 w/ 11RF.

## 2015-10-18 NOTE — Telephone Encounter (Signed)
Per pts request Rx resent for #90.

## 2015-11-08 ENCOUNTER — Ambulatory Visit (INDEPENDENT_AMBULATORY_CARE_PROVIDER_SITE_OTHER): Payer: Medicare Other | Admitting: Neurology

## 2015-11-08 ENCOUNTER — Encounter: Payer: Self-pay | Admitting: Neurology

## 2015-11-08 VITALS — BP 132/74 | HR 88 | Ht 64.0 in | Wt 155.0 lb

## 2015-11-08 DIAGNOSIS — C7931 Secondary malignant neoplasm of brain: Secondary | ICD-10-CM | POA: Diagnosis not present

## 2015-11-08 DIAGNOSIS — G40209 Localization-related (focal) (partial) symptomatic epilepsy and epileptic syndromes with complex partial seizures, not intractable, without status epilepticus: Secondary | ICD-10-CM

## 2015-11-08 DIAGNOSIS — R29898 Other symptoms and signs involving the musculoskeletal system: Secondary | ICD-10-CM

## 2015-11-08 MED ORDER — LEVETIRACETAM ER 500 MG PO TB24: ORAL_TABLET | ORAL | Status: DC

## 2015-11-08 NOTE — Patient Instructions (Signed)
1. Continue Levetiracetam ER '500mg'$  daily 2. Refer to Physical Therapy for leg weakness 3. Follow-up in 6 months, call for any change in symptoms  Seizure Precautions: 1. If medication has been prescribed for you to prevent seizures, take it exactly as directed.  Do not stop taking the medicine without talking to your doctor first, even if you have not had a seizure in a long time.   2. Avoid activities in which a seizure would cause danger to yourself or to others.  Don't operate dangerous machinery, swim alone, or climb in high or dangerous places, such as on ladders, roofs, or girders.  Do not drive unless your doctor says you may.  3. If you have any warning that you may have a seizure, lay down in a safe place where you can't hurt yourself.    4.  No driving for 6 months from last seizure, as per [facility].   Please refer to the following link on the [place] website for more information: [email or web address]/[email or web address]   5.  Maintain good sleep hygiene. Avoid alcohol.  6.  Contact your doctor if you have any problems that may be related to the medicine you are taking.  7.  Call 911 and bring the patient back to the ED if:        A.  The seizure lasts longer than 5 minutes.       B.  The patient doesn't awaken shortly after the seizure  C.  The patient has new problems such as difficulty seeing, speaking or moving  D.  The patient was injured during the seizure  E.  The patient has a temperature over 102 F (39C)  F.  The patient vomited and now is having trouble breathing

## 2015-11-08 NOTE — Progress Notes (Signed)
NEUROLOGY FOLLOW UP OFFICE NOTE  DOMENIK TRICE [id number]  HISTORY OF PRESENT ILLNESS: I had the pleasure of seeing Damere Brandenburg in follow-up in the neurology clinic on 11/08/2015.  The patient was last seen a month ago after he had a new onset seizure last 08/04/15 and was found to have brain metastases to the right frontal lobe. He was started on Keppra but was reporting bilateral leg weakness and double vision on Keppra '500mg'$  BID. He felt a little better when his PCP reduced dose to '250mg'$  BID but still had the symptoms. He was switched to Levetiracetam ER '500mg'$  qhs and presents today reporting feeling much better. He is sleeping better, 8-9 hours, vision is good. He still feels occasionally weak in both legs, but reports today is a good day. His right hand occasionally feels weaker, where he feels he cannot squeeze well. He did yard work for 10 minutes, then had to sit down for 20 minutes. He has mild back pain that is relieved when he sits down. No bowel/bladder dysfunction. He denies any further seizures or seizure-like symptoms since December 2016. He has infrequent mild 1/10 headaches. He denies any dizziness, diplopia, dysarthria, dysphagia, focal numbness/tingling/weakness, olfactory/gustatory hallucinations, myoclonic jerks.   HPI 10/11/15: This is a pleasant 75 yo RH man with a history of lung cancer s/p lobectomy, chemotherapy and radiation, hyperlipidemia, CAD s/p MI, peripheral vascular disease, abdominal aortic aneurysm, admitted to [facility] 08/04/15 after a new onset seizure and found to have brain metastases. He states he got his flu shot then went to work, got home and started having a beer the threw up, his eyes got blurry to the point he could not see anything, then has no recollection of events. According to ER notes, after the nausea and vomiting, he decided to drink beer to feel better, his wife left the room then came back to find him with arms extended, shaking, eyes rolled back  lasting 5-10 seconds. He recalls coming to without feeling confused, no focal weakness. He was brought to [facility] ER where CT head showed a 1.6 x 1.5 cm hyperdense mass in the right frontal lobe worrisome for focal metastases. MRI was done showing 2 enhancing masses in the right frontal lobe measuring up to 2 x 1.7 cm, no mass effect. He was started on Keppra '500mg'$  BID with no further seizures. He started stereotactic radiation, with plan for repeat MRI in April.   He denies any further seizures since then. He denies any prior history of seizures. His wife denies any staring/unresponsive episodes, no gaps in time, olfactory/gustatory hallucinations, deja vu, rising epigastric sensation, focal numbness/tingling/weakness, myoclonic jerks, headaches, dysarthria/dysphagia, bowel/bladder dysfunction. He was discharged home on Decadron and Keppra, and reports that he was "just totally out of it," functioning only 50%. He felt significantly better getting of Decadron, but continued to have generalized weakness, particularly in the legs, blurred vision, and reported this to his PCP Dr. Anitra Lauth. Keppra dose was reduced to '250mg'$  BID 5 days ago, and he reports that symptoms are better, but still there. He states that he wakes up feeling fine, but then starts feeling the symptoms after taking morning Keppra dose. He has mild 2-3 over 10 back pain when walking, relieved when he sits down. He continues to drink beer daily but has reduced to 1-2 a day from 3-5 beers a day. His wife reports he would drink 3 beer cases in a week.  Epilepsy Risk Factors: Brain metastases in the right frontal  lobe. Otherwise he had a normal birth and early development. There is no history of febrile convulsions, CNS infections such as meningitis/encephalitis, significant traumatic brain injury, neurosurgical procedures, or family history of seizures.   I personally reviewed MRI brain with and without contrast done 08/12/15 which showed an 8x61m  centrally necrotic metastasis in the right posterior frontal lobe; there is a centrally necrotic mass measuring 179mat the right vertex frontoparietal junction with regional vasogenic edema. Routine EEG 08/05/15 was a normal wake and sleep study  PAST MEDICAL HISTORY: Past Medical History  Diagnosis Date  . Hyperlipidemia   . Diverticulosis   . Coronary artery disease     MI 1992, S/P  PTCA; negative stress test in November 2011 with no ischemia.   . [place]ipheral vascular [place] ([facility]    4.8x4.6 cm infrarenal abdominal aortic fusiform aneurysm, 1.5 cm right common iliac artery aneurysm  . AAA (abdominal aortic aneurysm) ([facility]  . Hearing loss     Bilateral   . [facility]    Dr [name]  . History of radiation therapy 11/10/13-12/12/13    lung,50Gy/2548f. Lung cancer ([facility]    non-small cell;  L upper lobectomy with mediastinal LN dissection, chemo, and radiation in 2014/2015. Remission until pt had questionable seizure 07/2015--MRI showed brain mets; palliative brain rad (stereotactic radiosurgery) started 08/25/15  . Hypertension   . Shortness of breath   . Tobacco dependence     "Quit" 1992, but pt has smoked "on and off" since that time  . Asymptomatic cholelithiasis 07/2015    Incidental finding on PET CT  . S/P radiation therapy SRS 08/25/15    frontoparietal 18gy,posterior frintal lobe 20gy,    MEDICATIONS: Current Outpatient Prescriptions on File Prior to Visit  Medication Sig Dispense Refill  . amLODipine (NORVASC) 5 MG tablet TAKE 1 TABLET (5 MG TOTAL) BY MOUTH DAILY. 90 tablet 3  . aspirin EC 81 MG tablet Take 81 mg by mouth daily.    . budesonide-formoterol (SYMBICORT) 160-4.5 MCG/ACT inhaler Inhale 1-2 puffs into the lings every 12 hours. Gargle and Spit after use. --- Please establish with new PCP for further refills 10.2 Inhaler 0  . CRESTOR 40 MG tablet TAKE 1 TABLET (40 MG TOTAL) BY MOUTH DAILY. 30 tablet 5  .  Glucosamine-Chondroit-Vit C-Mn (GLUCOSAMINE 1500 COMPLEX PO) Take 1,500 mg by mouth daily.     . lMarland KitchenvETIRAcetam (KEPPRA XR) 500 MG 24 hr tablet Take 1 tablet every night 30 tablet 6  . metoprolol (LOPRESSOR) 50 MG tablet Take 1 tablet (50 mg total) by mouth 2 (two) times daily. 180 tablet 3  . Multiple Vitamin (MULTIVITAMIN) tablet Take 1 tablet by mouth every evening.      No current facility-administered medications on file prior to visit.    ALLERGIES: Allergies  Allergen Reactions  . Fluvirin [Influenza Vac Typ A&B Surf Ant] Nausea And Vomiting and Other (See Comments)    Seizure like activity?  . IMarland Kitchendinated Diagnostic Agents Hives    1 hive on lt cheek lasting approximately 1 hour on last 2 CT per pt; needs pre meds in future; 50 mg benadryl po 1 hr prior to exam per Dr. EntWeber Cooks FAMILY HISTORY: Family History  Problem Relation Age of Onset  . Hypertension Mother   . Prostate cancer Father     Prostate cancer  . Cancer Father     Brain Tumor  . Lung cancer Sister  NON SMOKER  . Cancer Sister   . Hyperlipidemia Sister   . Diabetes Neg Hx   . Stroke Neg Hx   . Hyperlipidemia Brother   . Heart attack Brother   . Hypertension Brother     SOCIAL HISTORY: Social History   Social History  . Marital Status: Married    Spouse Name: N/A  . Number of Children: N/A  . Years of Education: N/A   Occupational History  . Retired Risk analyst    Social History Main Topics  . Smoking status: Current Every Day Smoker -- 1.00 packs/day for 24 years    Types: Cigarettes    Last Attempt to Quit: 05/28/2013  . Smokeless tobacco: Never Used     Comment: QUIT 15 YEARS AGO  . Alcohol Use: 9.0 oz/week    3 Glasses of wine, 12 Cans of beer per week     Comment: 1-2 beers a day  . Drug Use: No  . Sexual Activity: Not on file   Other Topics Concern  . Not on file   Social History Narrative   HEART HEALTHY DIET.     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   MARRIED, 2 children who  live in [place].  Smoked on and off since then.   ALCOHOL USE -YES- RED WINE SOCIALLY, couple of beers at night usually.    REVIEW OF SYSTEMS: Constitutional: No fevers, chills, or sweats, no generalized fatigue, change in appetite Eyes: No visual changes, double vision, eye pain Ear, nose and throat: No hearing loss, ear pain, nasal congestion, sore throat Cardiovascular: No chest pain, palpitations Respiratory:  No shortness of breath at rest or with exertion, wheezes GastrointestinaI: No nausea, vomiting, diarrhea, abdominal pain, fecal incontinence Genitourinary:  No dysuria, urinary retention or frequency Musculoskeletal:  No neck pain, +back pain Integumentary: No rash, pruritus, skin lesions Neurological: as above Psychiatric: No depression, insomnia, anxiety Endocrine: No palpitations, fatigue, diaphoresis, mood swings, change in appetite, change in weight, increased thirst Hematologic/Lymphatic:  No anemia, purpura, petechiae. Allergic/Immunologic: no itchy/runny eyes, nasal congestion, recent allergic reactions, rashes  PHYSICAL EXAM: Filed Vitals:   11/08/15 1246  BP: 132/74  Pulse: 88   General: No acute distress Head:  Normocephalic/atraumatic Neck: supple, no paraspinal tenderness, full range of motion Heart:  Regular rate and rhythm Lungs:  Clear to auscultation bilaterally Back: No paraspinal tenderness Skin/Extremities: No rash, no edema Neurological Exam: alert and oriented to person, place, and time. No aphasia or dysarthria. Fund of knowledge is appropriate.  Recent and remote memory are intact.  Attention and concentration are normal.    Able to name objects and repeat phrases. Cranial nerves: Pupils equal, round, reactive to light. Extraocular movements intact with no nystagmus. Visual fields full. Facial sensation intact. No facial asymmetry. Tongue, uvula, palate midline.  Motor: Bulk and tone normal, muscle strength 5/5 throughout with  no pronator drift.  Sensation to light touch intact.  No extinction to double simultaneous stimulation.  Deep tendon reflexes 2+ throughout, toes downgoing.  Finger to nose testing intact.  Gait narrow-based and steady, able to tandem walk adequately.  Romberg negative.  IMPRESSION: This is a pleasant 75 yo RH man with a history of lung cancer s/p lobectomy, radiation, chemotherapy, found to have 2 right frontal brain metastases after he had a seizure on 08/04/15. Routine EEG normal. He underwent stereotactic radiation therapy with follow-up scan scheduled for April. He was reporting leg weakness and double vision that he felt was  related to [place]. He was taking Keppra '250mg'$  BID but still reported the same symptoms. He reports feeling better on extended-release Levetiracetam '500mg'$  qhs. No further vision changes, sleep is better. No seizures since December 2016. He asked about discontinuing Keppra. We discussed that this is a low dose, if symptoms change such as seizure recurrence, would would have to increase dose to '1000mg'$  qhs. He expressed understanding. He continues to report intermittent leg weakness, more after exertion. His wife asks about relation to smoking, we discussed smoking cessation and claudication. He will be referred to PT for leg weakness and home exercise program. He is aware of [place] driving laws to stop driving after a seizure, until 6 months seizure-free. He will follow-up after 6 months and knows to call for any problems.  Thank you for allowing me to participate in his care.  Please do not hesitate to call for any questions or concerns.  The duration of this appointment visit was 25 minutes of face-to-face time with the patient.  Greater than 50% of this time was spent in counseling, explanation of diagnosis, planning of further management, and coordination of care.   Ellouise Newer, M.D.   CC: Dr. Anitra Lauth

## 2015-11-12 ENCOUNTER — Other Ambulatory Visit: Payer: Self-pay | Admitting: *Deleted

## 2015-11-12 ENCOUNTER — Telehealth: Payer: Self-pay | Admitting: *Deleted

## 2015-11-12 DIAGNOSIS — C7931 Secondary malignant neoplasm of brain: Secondary | ICD-10-CM

## 2015-11-12 DIAGNOSIS — C7949 Secondary malignant neoplasm of other parts of nervous system: Secondary | ICD-10-CM

## 2015-11-12 NOTE — Telephone Encounter (Signed)
1.  Do you need a wheel chair?  NO  2. On oxygen? NO  3. Have you ever had any surgery in the body part being scanned?  YES  4. Have you ever had any surgery on your brain or heart?    NO TO HEART                   5. Have you ever had surgery on your eyes or ears?       NO                       6. Do you have a pacemaker or defibrillator?  NO  7. Do you have a Neurostimulator?  NO  8. Claustrophobic?  NO  9. Any risk for metal in eyes? NO  10. Injury by bullet, buckshot, or shrapnel?  NO  11. Stent?  YES IN HEART  12. Hx of Cancer? YES LUNG AND BRAIN  13. Kidney or Liver disease?  NO  14. Hx of Lupus, Rheumatoid Arthritis or Scleroderma? NO  15. IV Antibiotics or long term use of NSAIDS?   NO  16. HX of Hypertension?  NO  17. Diabetes?  NO  18. Allergy to contrast?  NO   19. Recent labs.

## 2015-11-13 ENCOUNTER — Other Ambulatory Visit: Payer: Self-pay | Admitting: Internal Medicine

## 2015-11-15 NOTE — Telephone Encounter (Signed)
Routing to new pcp

## 2015-11-17 ENCOUNTER — Ambulatory Visit: Payer: Medicare Other | Attending: Neurology | Admitting: Physical Therapy

## 2015-11-17 DIAGNOSIS — R2681 Unsteadiness on feet: Secondary | ICD-10-CM | POA: Diagnosis present

## 2015-11-17 DIAGNOSIS — M6281 Muscle weakness (generalized): Secondary | ICD-10-CM | POA: Insufficient documentation

## 2015-11-17 DIAGNOSIS — M25672 Stiffness of left ankle, not elsewhere classified: Secondary | ICD-10-CM | POA: Diagnosis present

## 2015-11-17 DIAGNOSIS — R2689 Other abnormalities of gait and mobility: Secondary | ICD-10-CM | POA: Diagnosis not present

## 2015-11-17 NOTE — Patient Instructions (Signed)
Walking Program:  Begin walking for exercise for 4 minutes, 1-2 times/day, 5 days per week. Try to check your blood pressure after walking.     Progress your walking program by adding 1 minute to your routine each week, as tolerated.  Be sure to wear good walking shoes, walk in a safe environment and only progress to your tolerance.      Achilles / Gastroc, Standing    Stand, right foot behind, heel on floor and turned slightly out, leg straight, forward leg bent. Move hips forward. Hold _45__ seconds. Repeat _3-4_ times per day on each leg.  Feet Apart (Compliant Surface) Head Motion - Eyes Closed    Stand with your back to a corner with a stable chair in front of you. Stand on 1 pillow with feet shoulder width apart. Close eyes and move head slowly: up and down 10 times; right to left 10 times.Repeat  __2-3__ times per day.

## 2015-11-17 NOTE — Therapy (Addendum)
[facility] [address] [place], [place], [address] Phone: [phone]   Fax:  [phone]  Physical Therapy Evaluation  Patient Details  Name: Jordan Mccullough MRN: [id number] Date of Birth: Jul 22, 1941 Referring Provider: Ellouise Newer, MD  Encounter Date: 11/17/2015      PT End of Session - 11/17/15 1046    Visit Number 1   Number of Visits 5  eval + 4 visits   Date for PT Re-Evaluation 12/17/15   Authorization Type [employer] Medicare - GCodes required   PT Start Time 0935   PT Stop Time 1032   PT Time Calculation (min) 57 min   Activity Tolerance Patient limited by fatigue  During 6MWT.   Behavior During Therapy [facility] for tasks assessed/performed      Past Medical History  Diagnosis Date  . Hyperlipidemia   . Diverticulosis   . Coronary artery disease     MI 1992, S/P  PTCA; negative stress test in November 2011 with no ischemia.   . Peripheral vascular disease ([place]) 1/14    4.8x4.6 cm infrarenal abdominal aortic fusiform aneurysm, 1.5 cm right common iliac artery aneurysm  . AAA (abdominal aortic aneurysm) ([place])   . Hearing loss     Bilateral   . Myocardial infarction [facility]) 1992    Dr Angelena Form    . History of radiation therapy 11/10/13-12/12/13    lung,50Gy/22f  . [place] 10[place]14    non-small cell;  L upper lobectomy with mediastinal LN dissection, chemo, and radiation in 2014/2015. Remission until pt had questionable seizure 07/2015--MRI showed brain mets; palliative brain rad (stereotactic radiosurgery) started 08/25/15  . Hypertension   . Shortness of breath   . Tobacco dependence     "Quit" 1992, but pt has smoked "on and off" since that time  . Asymptomatic cholelithiasis 07/2015    Incidental finding on PET CT  . S/P radiation therapy SRS 08/25/15    frontoparietal 18gy,posterior frintal lobe 20gy,    Past Surgical History  Procedure Laterality Date  . Ptca  1992  . Inguinal heriiorrhaphy  bilaterally    . Colonoscopy w/ polypectomy  2003     negative 2010,due 2020; Dr BOlevia Perches . Pilonidal cyst excision    . Rotator cuff repair      Bilateral  . Cystoscopy/retrograde/ureteroscopy Bilateral 10/09/2012    Procedure: BILATERAL RETROGRADE bladder and urethral BIOPSY ;  Surgeon: DMolli Hazard MD;  Location: [facility];  Service: Urology;  Laterality: Bilateral;  BILATERAL RETROGRADE   . Prostate biopsy N/A 10/09/2012    Procedure: PROSTATIC URETHRAL BIOPSY;  Surgeon: DMolli Hazard MD;  Location: [facility];  Service: Urology;  Laterality: N/A;  PROSTATIC URETHRAL BIOPSY   . Hernia repair Bilateral     Inguinal  . Mediastinoscopy N/A 05/01/2013    Procedure: MEDIASTINOSCOPY;  Surgeon: BGaye Pollack MD;  [facility];  Service: Thoracic;  Laterality: N/A;  . Video bronchoscopy N/A 05/01/2013    Procedure: VIDEO BRONCHOSCOPY;  Surgeon: BGaye Pollack MD;  [facility];  Service: Thoracic;  Laterality: N/A;  . Coronary angioplasty      no stents  . Thorocotomy with lobectomy Left 05/29/2013    Procedure: LEFT THOROCOTOMY WITH LEFT UPPER LOBE LOBECTOMY;  Surgeon: BGaye Pollack MD;  [facility];  Service: Thoracic;  Laterality: Left;  . Abdominal aortic endovascular stent graft N/A 05/07/2014    Procedure: ABDOMINAL AORTIC ENDOVASCULAR STENT GRAFT;  Surgeon: VSerafina Mitchell  MD;  Location: [facility] OR;  Service: Vascular;  Laterality: N/A;  . Eeg  08/05/15    Pt placed on keppra just prior to this test due to having ? seizure (MRI showed brain mets)  . Pfts  04/2013    Minimal obstructive airway disease  . Carotid duplex dopplers  07/2015    1-39% on R, no signif dz noted on L    There were no vitals filed for this visit.  Visit Diagnosis:  Other abnormalities of gait and mobility - Plan: PT plan of care cert/re-cert  Unsteadiness on feet - Plan: PT plan of care cert/re-cert  Muscle weakness (generalized) - Plan: PT plan of care cert/re-cert  Stiffness of left  ankle, not elsewhere classified - Plan: PT plan of care cert/re-cert      Subjective Assessment - 11/17/15 0942    Subjective Pt wsa admitted to [facility] last 08/04/15 after a new onset seizure and found to have brain metastases. Had stereotactic radiation treatment in January. Has noticed increased weakness in LLE over the past 2-3 weeks. Has noticed he has been catching L toe.    Pertinent History PMH significant for: lung cancer s/p lobectomy, chemotherapy and radiation, hyperlipidemia, CAD s/p MI, peripheral vascular disease, abdominal aortic aneurysm   Patient Stated Goals "I want to get stronger in my legs."   Currently in Pain? No/denies            [facility] PT Assessment - 11/17/15 0001    Assessment   Medical Diagnosis leg weakness   Referring Provider Ellouise Newer, MD   Onset Date/Surgical Date 10/27/15   Precautions   Precautions Other (comment)   Precaution Comments metastatic lung cancer; had seizure in 07/2015   Restrictions   Weight Bearing Restrictions No   Balance Screen   Has the patient fallen in the past 6 months No   Has the patient had a decrease in activity level because of a fear of falling?  Yes   Is the patient reluctant to leave their home because of a fear of falling?  Yes   [place] Private residence   Living Arrangements Spouse/significant other   Type of [place] to enter   Entrance Stairs-Number of Steps 3   Entrance Stairs-Rails None  hangs onto frame of door   Home Layout Two level   Alternate Level Stairs-Number of Steps 14   Alternate Level Stairs-Rails Right   Home Equipment None   Additional Comments Never really goes upstairs; bedroom is on main level of home   Prior Function   Level of [place] Retired   Copy   Leisure unable to fly planes right now due to seizure in 07/2015   Cognition   Overall Cognitive Status Within Functional Limits  for tasks assessed  Pt does not perceive cognitve changes.   Sensation   Light Touch Appears Intact  in BLE's   Proprioception Appears Intact  in BLE's   Coordination   Gross Motor Movements are Fluid and Coordinated Yes   ROM / Strength   AROM / PROM / Strength AROM;PROM;Strength   AROM   Overall AROM  Deficits   Overall AROM Comments L ankle DF limited to grossly neutral. R ankle DF lmited to grossly 5 degrees.   PROM   Overall PROM  Deficits   Overall PROM Comments L ankle DF limited to grossly neutral. R ankle DF lmited to grossly  5 degrees. Appears to be due to limited extensibility of B gastrocnemius muscles.   Strength   Overall Strength Deficits   Overall Strength Comments 4/5 L hip flexion and B hip extension, B hip ABD, L ankle eversion.   Ambulation/Gait   Ambulation Distance (Feet) 1404 Feet  on 6MWT   6 Minute walk- Post Test   6 Minute Walk Post Test yes   BP (mmHg) (!) 140/102 mmHg   HR (bpm) 118   02 Sat (%RA) 97 %   6 minute walk test results    Aerobic Endurance Distance [id number]   Endurance additional comments Ambulated outdoors over sidewalk. Noted intermittent L forefoot drag during LLE advancement beginning at ~1 minute into 6MWT. No overt LOB. With increased fatigue, distance ambulated, also noted increasingly more prominent L hip external rotation.   Balance   Balance Assessed Yes   Static Standing Balance   Static Standing - Balance Support No upper extremity supported   Static Standing - Level of Assistance 5: Stand by assistance   Static Standing Balance -  Activities  Romberg - Eyes Opened, Foam;Romberg - Eyes Closed , Foam   Static Standing - Comment/# of Minutes EO on Airex foam: x30 sec without postural sway or LOB. EC on Airex x30 seconds with significant multidirectional sway.                   [facility] Adult PT Treatment/Exercise - 11/17/15 0001    Transfers   Transfers Sit to Stand;Stand to Sit   Sit to Stand 7: Independent    Five time sit to stand comments  9.51 sec  without UE use   Stand to Sit 7: Independent   Ambulation/Gait   Ambulation/Gait Yes   Ambulation/Gait Assistance 5: Supervision;6: Modified independent (Device/Increase time)   Ambulation/Gait Assistance Details Mod I over level, indoor surfaces and with initial 1 minute of gait over unlevel, outdoor surfaces. After ambulating > 1 minute, (S) required due to L forefoot drag. Pt aware but demonstrated difficulty self-correcting/avoiding. No overt LOB.   Assistive device None   Gait Pattern Step-through pattern;Decreased dorsiflexion - left;Poor foot clearance - left;Abducted - left;Decreased hip/knee flexion - left  with increased distance ambulated, noted inc L hip ER   Ambulation Surface Level;Unlevel;Indoor;Outdoor;Paved   Gait velocity 3.21 ft/sec   Curb 5: Supervision   Curb Details (indicate cue type and reason) without AD; no LOB but pt very careful   Exercises   Exercises Other Exercises   Other Exercises  Initiated HEP with focus on increasing gastrocnemius extensibility (focus on L) and on increasing pt reliance on vestibular input. Pt gave effective return deom of HEP; see Pt Instructions for details on all exercises, reps, sets (2 sets performed during this session), frequency, and duration.                PT Education - 11/17/15 1049    Education provided Yes   Education Details PT eval findings, goals, and POC. HEP and walking program initiated; see Pt Instructions.   Person(s) Educated Patient   Methods Explanation;Verbal cues;Demonstration;Handout   Comprehension Verbalized understanding;Returned demonstration                    Plan - 11/17/15 1051    Clinical Impression Statement Pt is a 75 y/o M referred to outpatient PT to address lower extremity weakness. PMH significant for: lung cancer s/p lobectomy, chemotherapy and radiation, hyperlipidemia, CAD s/p MI, peripheral vascular disease, abdominal aortic  aneurysm. Pt was [facility] to [facility] on 08/04/15 after new onset of seizure; found to have brain metastases at this time. Pt started stereotactic radiation in 08/2015. PT evaluation reveals the following: gait impairments (specifically, increasingly more L hip external rotation and L forefoot drag with increased fatigue); BLE weakness (most prominent in B hips), decreased functional endurance, decreased extensibility of B gastrocnemius muscles (moreso on L); decreased postural stability/control when vestibular reliance required. Recommended outpatient PT 2x/week for 4 weeks to address said impairments; however, pt requesting 1x/week due to upcoming trip out of town and pt reliance on wife for transportation.    Pt will benefit from skilled therapeutic intervention in order to improve on the following deficits Abnormal gait;Cardiopulmonary status limiting activity;Decreased activity tolerance;Decreased balance;Decreased endurance;Decreased strength;Impaired flexibility   Rehab Potential Fair   Clinical Impairments Affecting Rehab Potential transportation limitations   PT Frequency 1x / week  Recommending 2x/week for 4 weeks; however, pt requesting 1x/week due to trnasportation limitations   PT Duration 4 weeks   PT Treatment/Interventions ADLs/Self Care Home Management;DME Instruction;Balance training;Vestibular;Therapeutic exercise;Orthotic Fit/Training;Therapeutic activities;Patient/family education;Functional mobility training;Neuromuscular re-education;Gait training;Stair training;Manual techniques   PT Next Visit Plan Assess current HEP and progress. Ask about walking program, BP.   Consulted and Agree with Plan of Care Patient     PT Short Term Goals   PT SHORT TERM GOAL #1   Title STG's = LTG's        PT Long Term Goals   PT LONG TERM GOAL #1   Title Pt will independently perform HEP to maximize functional gains made  in PT.   (Target date: 12/15/15)   PT LONG TERM GOAL #2   Title Pt will  improve 6MWT distance from 1,404' to 1,517' to indicate  significant improvement in functional endurance.   PT LONG TERM GOAL #3   Title Pt will independently ambulate 500' over unlevel, paved surfaces  with no occurrence left foot drag to indicate progress toward PLOF.   PT LONG TERM GOAL #4   Title Pt will perform condition 4 of Modified CTSIB for > / = 30 seconds  without LOB to indicate improved vestibular reliance.            G-Codes - 11/26/15 1058    Functional Assessment Tool Used 6MWT distance = 1,404'   (1729' is normative value for patient's age)   Functional Limitation Mobility: Walking and moving around   Mobility: Walking and Moving Around Current Status [phone]) At least 20 percent but less than 40 percent impaired, limited or restricted   Mobility: Walking and Moving Around Goal Status [phone]) At least 1 percent but less than 20 percent impaired, limited or restricted   Mobility: Walking and Moving Around Discharge Status [phone]) --       Problem List Patient Active Problem List   Diagnosis Date Noted  . Complaints of leg weakness 11/08/2015  . 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 seizures, not intractable, without status epilepticus ([place]) 10/11/2015  . Syncope 08/05/2015  . Seizure-like activity ([place]) 08/04/2015  . Brain metastases ([place]) 08/04/2015  . Seizure ([place]) 08/04/2015  . AAA (abdominal aortic aneurysm) without rupture ([place]) 06/21/2015  . Aftercare following surgery of the circulatory system 06/21/2015  . Essential hypertension 06/23/2014  . Lung cancer ([place]) 06/19/2013  . History of AAA (abdominal aortic aneurysm) repair 10/14/2012  . Aneurysm of common iliac artery ([place]) 10/14/2012  . Elevated PSA 07/10/2012  . CAD (coronary artery disease) 07/31/2011  . DIVERTICULOSIS,  COLON 07/05/2010  . COLONIC POLYPS 02/24/2010  . AMBLYOPIA, HX OF 02/24/2010  . HYPERGLYCEMIA, FASTING 05/15/2008  . Hyperlipidemia  05/23/2007  . MYOCARDIAL INFARCTION, HX OF 05/23/2007    Billie Ruddy, PT, DPT [facility] [address] [place], [place], [address] Phone: [phone]   Fax:  [phone] 11/17/2015, 9:24 PM  Name: Jordan Mccullough MRN: [id number] Date of Birth: [birth year]

## 2015-11-25 ENCOUNTER — Encounter: Payer: Self-pay | Admitting: Family Medicine

## 2015-11-30 ENCOUNTER — Other Ambulatory Visit: Payer: Medicare Other

## 2015-12-01 ENCOUNTER — Ambulatory Visit: Payer: Medicare Other | Attending: Neurology | Admitting: Physical Therapy

## 2015-12-01 VITALS — BP 150/92 | HR 82

## 2015-12-01 DIAGNOSIS — R2681 Unsteadiness on feet: Secondary | ICD-10-CM | POA: Diagnosis present

## 2015-12-01 DIAGNOSIS — R2689 Other abnormalities of gait and mobility: Secondary | ICD-10-CM | POA: Insufficient documentation

## 2015-12-01 NOTE — Addendum Note (Signed)
Addended by: Billie Ruddy A on: 12/01/2015 10:08 PM   Modules accepted: Orders

## 2015-12-01 NOTE — Patient Instructions (Signed)
Feet Together (Compliant Surface) Head Motion - Eyes Closed    Stand with your back to a corner with a stable chair in front of you. Stand on 1 pillow with feet together. Close eyes and move head slowly: up and down 5 times; right to left 5 times.Repeat __2-3__ times per day.  LEFT ANKLE CONTROL        Stand with your RIGHT hand on a stable surface (counter top) and practice taking a step with your LEFT leg, making sure you start by bending LEFT ankle upward ("toes up") while you bend your knee. While keeping toes up, strike ground with knee straight and heel first.  Practice this on the LEFT leg 20 reps, 1-2 times per day. * Try to think about moving your left leg in this way when walking.

## 2015-12-01 NOTE — Therapy (Signed)
[facility] [address] [place], [place], [address] Phone: [phone]   Fax:  [phone]  Physical Therapy Treatment  Patient Details  Name: Jordan Mccullough MRN: [id number] Date of Birth: 1941-07-15 Referring Provider: Ellouise Newer, MD  Encounter Date: 12/01/2015      PT End of Session - 12/01/15 1528    Visit Number 2   Number of Visits 5   Date for PT Re-Evaluation 12/17/15   Authorization Type UHC Medicare - GCodes required   PT Start Time 1316   PT Stop Time 1401   PT Time Calculation (min) 45 min   Equipment Utilized During Treatment Gait belt   Activity Tolerance Patient tolerated treatment well   Behavior During Therapy [facility] for tasks assessed/performed      Past Medical History  Diagnosis Date  . Hyperlipidemia   . Diverticulosis   . Coronary artery disease     MI 1992, S/P  PTCA; negative stress test in November 2011 with no ischemia.   . Peripheral vascular disease ([place]) 1/14    4.8x4.6 cm infrarenal abdominal aortic fusiform aneurysm, 1.5 cm right common iliac artery aneurysm  . AAA (abdominal aortic aneurysm) ([place])   . Hearing loss     Bilateral   . Myocardial infarction [facility]) 1992    Dr Angelena Form    . History of radiation therapy 11/10/13-12/12/13    lung,50Gy/62f  . [place]    non-small cell;  L upper lobectomy with mediastinal LN dissection, chemo, and radiation in 2014/2015. Remission until pt had questionable seizure 07/2015--MRI showed brain mets; palliative brain rad (stereotactic radiation therapy) started 08/25/15  . Hypertension   . Shortness of breath   . Tobacco dependence     "Quit" 1992, but pt has smoked "on and off" since that time  . Asymptomatic cholelithiasis 07/2015    Incidental finding on PET CT  . [facility]    Stereotactic radiation therapy: frontoparietal 18gy,posterior frontal lobe 20gy,    Past Surgical History  Procedure  Laterality Date  . Ptca  1992  . Inguinal heriiorrhaphy bilaterally    . Colonoscopy w/ polypectomy  2003     negative 2010,due 2020; Dr BOlevia Perches . Pilonidal cyst excision    . Rotator cuff repair      Bilateral  . Cystoscopy/retrograde/ureteroscopy Bilateral 10/09/2012    Procedure: BILATERAL RETROGRADE bladder and urethral BIOPSY ;  Surgeon: DMolli Hazard MD;  [facility]: [facility];  Service: Urology;  Laterality: Bilateral;  BILATERAL RETROGRADE   . Prostate biopsy N/A 10/09/2012    Procedure: PROSTATIC URETHRAL BIOPSY;  Surgeon: DMolli Hazard MD;  [facility]: [facility];  Service: Urology;  Laterality: N/A;  PROSTATIC URETHRAL BIOPSY   . Hernia repair Bilateral     Inguinal  . Mediastinoscopy N/A 05/01/2013    Procedure: MEDIASTINOSCOPY;  Surgeon: BGaye Pollack MD;  [facility];  Service: Thoracic;  Laterality: N/A;  . Video bronchoscopy N/A 05/01/2013    Procedure: VIDEO BRONCHOSCOPY;  Surgeon: BGaye Pollack MD;  [facility];  Service: Thoracic;  Laterality: N/A;  . Coronary angioplasty      no stents  . Thorocotomy with lobectomy Left 05/29/2013    Procedure: LEFT THOROCOTOMY WITH LEFT UPPER LOBE LOBECTOMY;  Surgeon: BGaye Pollack MD;  [facility];  Service: Thoracic;  Laterality: Left;  . Abdominal aortic endovascular stent graft N/A 05/07/2014    Procedure: ABDOMINAL AORTIC ENDOVASCULAR STENT GRAFT;  Surgeon: Serafina Mitchell, MD;  Location: [facility];  Service: Vascular;  Laterality: N/A;  . Eeg  08/05/15    Pt placed on keppra just prior to this test due to having ? seizure (MRI showed brain mets)  . Pfts  04/2013    Minimal obstructive airway disease  . Carotid duplex dopplers  07/2015    1-39% on R, no signif dz noted on L    Filed Vitals:   12/01/15 1327  BP: 150/92  Pulse: 82  SpO2: 95%        Subjective Assessment - 12/01/15 1319    Subjective "I (walk) about 6 minutes on the treadmill." Pt also reports, "My legs feel weak for the first hour in  the morning. I actually have to hold onto something." Pt also reports increased difficulty using LUE to put up dishes. Pt does not wish to pursue order for OT at this time.    Pertinent History PMH significant for: lung cancer s/p lobectomy, chemotherapy and radiation, hyperlipidemia, CAD s/p MI, peripheral vascular disease, abdominal aortic aneurysm   Patient Stated Goals "I want to get stronger in my legs."   Currently in Pain? No/denies                         [facility] Adult PT Treatment/Exercise - 12/01/15 0001    Ambulation/Gait   Ambulation/Gait Yes   Ambulation/Gait Assistance 5: Supervision;6: Modified independent (Device/Increase time)   Ambulation/Gait Assistance Details Gait [phone]' with continued intermittent L forefoot drag during gait over unlevel, paved surfaces. When wearing L Foot Up brace during subsequent gait tiral x480', L forefoot drag grossly resolved, with exception of 1 episode (able to self-recover balance) when ascending ramp. A lso noted decreased L arm swing, as compared with gait on PT evaluation.   Ambulation Distance (Feet) 960 Feet  480' x2   Assistive device None;Other (Comment)   Gait Pattern Step-through pattern;Decreased dorsiflexion - left;Poor foot clearance - left;Decreased hip/knee flexion - left;Decreased arm swing - left   Ambulation Surface Level;Unlevel;Indoor;Outdoor;Paved   Pre-Gait Activities Noted L gastrocnemius length now grossly WFL. Therefore, instructed pt to discontinue heel cord stretch at this time. Added the following pre-gait activity to HEP: standing with RUE support at countertop, pt performed blocked practice of LLE advancement with emphphasis on performing L ankle DF concurrently with L knee flexion, then L knee extension. Performed x20 reps with PT cueing.             Balance Exercises - 12/01/15 1526    Balance Exercises: Standing   Standing Eyes Closed Narrow base of support (BOS);Head turns;5 reps;Foam/compliant  surface  1 pillow; horiz, vertical head turns 2 x5 each           PT Education - 12/01/15 1519    Education provided Yes   Education Details HEP progressed; see PT Instructions. Trialed L Foot Up brace and provided paper handout on how to purchase brace online, if desired. Educated pt on available resources to further assist with functional use of LUE.    Person(s) Educated Patient   Methods Explanation;Demonstration;Verbal cues;Handout   Comprehension Verbalized understanding;Returned demonstration  Pt declined offer to pursue OT services for LUE functional impairments          PT Short Term Goals - 12/01/15 1531    PT SHORT TERM GOAL #1   Title STG's = LTG's            PT Long Term Goals -  12/01/15 2208    PT LONG TERM GOAL #1   Title Pt will independently perform HEP to maximize functional gains made in PT.   (Target date: 12/15/15)   Baseline Met 4/12.   Status Achieved   PT LONG TERM GOAL #2   Title Pt will improve 6MWT distance from 1,404' to 1,517' to indicate significant improvement in functional endurance.   Status On-going   PT LONG TERM GOAL #3   Title Pt will independently ambulate 500' over unlevel, paved surfaces with no occurrence left foot drag to indicate progress toward PLOF.   Status On-going   PT LONG TERM GOAL #4   Title Pt will perform condition 4 of Modified CTSIB for > / = 30 seconds without LOB to indicate improved vestibular reliance.     Status On-going               Plan - 12/01/15 1528    Clinical Impression Statement Session focused on progressing HEP and providing education on available resources, including OT services to maximize functional use of LUE; and L Foot Up brace to prevent L toe catch. Pt met initial goal for HEP compliance.    Rehab Potential Fair   Clinical Impairments Affecting Rehab Potential transportation limitations   PT Frequency 1x / week   PT Duration 4 weeks   PT Treatment/Interventions ADLs/Self Care Home  Management;DME Instruction;Balance training;Vestibular;Therapeutic exercise;Orthotic Fit/Training;Therapeutic activities;Patient/family education;Functional mobility training;Neuromuscular re-education;Gait training;Stair training;Manual techniques   PT Next Visit Plan Assess current HEP and progress prn.  Ask if pt ordered L Foot Up brace. Ask if pt has further considered OT services.    Consulted and Agree with Plan of Care Patient      Patient will benefit from skilled therapeutic intervention in order to improve the following deficits and impairments:  Abnormal gait, Cardiopulmonary status limiting activity, Decreased activity tolerance, Decreased balance, Decreased endurance, Decreased strength, Impaired flexibility  Visit Diagnosis: Other abnormalities of gait and mobility  Unsteadiness on feet     Problem List Patient Active Problem List   Diagnosis Date Noted  . Complaints of leg weakness 11/08/2015  . 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 seizures, not intractable, without status epilepticus ([place]) 10/11/2015  . Syncope 08/05/2015  . Seizure-like activity ([place]) 08/04/2015  . Brain metastases ([place]) 08/04/2015  . Seizure ([place]) 08/04/2015  . AAA (abdominal aortic aneurysm) without rupture ([place]) 06/21/2015  . Aftercare following surgery of the circulatory system 06/21/2015  . Essential hypertension 06/23/2014  . Lung cancer ([place]) 06/19/2013  . History of AAA (abdominal aortic aneurysm) repair 10/14/2012  . Aneurysm of common iliac artery ([place]) 10/14/2012  . Elevated PSA 07/10/2012  . CAD (coronary artery disease) 07/31/2011  . DIVERTICULOSIS, COLON 07/05/2010  . COLONIC POLYPS 02/24/2010  . AMBLYOPIA, HX OF 02/24/2010  . HYPERGLYCEMIA, FASTING 05/15/2008  . Hyperlipidemia 05/23/2007  . MYOCARDIAL INFARCTION, HX OF 05/23/2007    Billie Ruddy, PT, DPT [facility] [address] [place], [place], [address] Phone: [phone]   Fax:  [phone] 12/01/2015, 10:09 PM  Name: DEQUAN KINDRED MRN: [id number] Date of Birth: [birth year]

## 2015-12-03 ENCOUNTER — Ambulatory Visit
Admission: RE | Admit: 2015-12-03 | Discharge: 2015-12-03 | Disposition: A | Discharge status: Home / Self Care | Payer: Medicare Other | Source: Ambulatory Visit | Attending: Radiation Oncology | Admitting: Radiation Oncology

## 2015-12-03 DIAGNOSIS — C7931 Secondary malignant neoplasm of brain: Secondary | ICD-10-CM

## 2015-12-03 DIAGNOSIS — C7949 Secondary malignant neoplasm of other parts of nervous system: Secondary | ICD-10-CM

## 2015-12-06 ENCOUNTER — Other Ambulatory Visit: Payer: Self-pay | Admitting: Radiation Oncology

## 2015-12-06 ENCOUNTER — Other Ambulatory Visit: Payer: Self-pay | Admitting: Surgery

## 2015-12-06 LAB — CREATININE, SERUM: Creat: 0.83 mg/dL (ref 0.70–1.18)

## 2015-12-06 MED ORDER — VITAMIN E 1000 UNITS PO CAPS: 1000.0000 [IU] | ORAL_CAPSULE | Freq: Every day | ORAL | Status: DC

## 2015-12-06 MED ORDER — PENTOXIFYLLINE ER 400 MG PO TBCR: 400.0000 mg | EXTENDED_RELEASE_TABLET | Freq: Three times a day (TID) | ORAL | Status: DC

## 2015-12-08 ENCOUNTER — Ambulatory Visit: Payer: Medicare Other | Admitting: Physical Therapy

## 2015-12-08 DIAGNOSIS — R2689 Other abnormalities of gait and mobility: Secondary | ICD-10-CM

## 2015-12-08 DIAGNOSIS — R2681 Unsteadiness on feet: Secondary | ICD-10-CM

## 2015-12-08 NOTE — Therapy (Signed)
[facility] [address] [place], [place], [address] Phone: [phone]   Fax:  [phone]  Physical Therapy Treatment  Patient Details  Name: Jordan Mccullough MRN: [id number] Date of Birth: [birth year] Referring Provider: Ellouise Newer, MD  Encounter Date: 12/08/2015      PT End of Session - 12/08/15 2015    Visit Number 3   Number of Visits 5   Date for PT Re-Evaluation 12/17/15   Authorization Type UHC Medicare - GCodes required   PT Start Time 1529   PT Stop Time 1605  Session ended early, per pt request, due to fatigue   PT Time Calculation (min) 36 min   Equipment Utilized During Treatment Gait belt   Activity Tolerance Patient tolerated treatment well   Behavior During Therapy [facility] for tasks assessed/performed      Past Medical History  Diagnosis Date  . Hyperlipidemia   . Diverticulosis   . Coronary artery disease     MI 1992, S/P  PTCA; negative stress test in November 2011 with no ischemia.   . Peripheral vascular disease ([place]) 1/14    4.8x4.6 cm infrarenal abdominal aortic fusiform aneurysm, 1.5 cm right common iliac artery aneurysm  . AAA (abdominal aortic aneurysm) ([place])   . Hearing loss     Bilateral   . Myocardial infarction [facility]) 1992    Dr Angelena Form    . History of radiation therapy 11/10/13-12/12/13    lung,50Gy/42f  . [place]    non-small cell;  L upper lobectomy with mediastinal LN dissection, chemo, and radiation in 2014/2015. Remission until pt had questionable seizure 07/2015--MRI showed brain mets; palliative brain rad (stereotactic radiation therapy) started 08/25/15  . Hypertension   . Shortness of breath   . Tobacco dependence     "Quit" 1992, but pt has smoked "on and off" since that time  . Asymptomatic cholelithiasis 07/2015    Incidental finding on PET CT  . [facility]    Stereotactic radiation therapy: frontoparietal 18gy,posterior frontal  lobe 20gy,    Past Surgical History  Procedure Laterality Date  . Ptca  1992  . Inguinal heriiorrhaphy bilaterally    . Colonoscopy w/ polypectomy  2003     negative 2010,due 2020; Dr BOlevia Perches . Pilonidal cyst excision    . Rotator cuff repair      Bilateral  . Cystoscopy/retrograde/ureteroscopy Bilateral 10/09/2012    Procedure: BILATERAL RETROGRADE bladder and urethral BIOPSY ;  Surgeon: DMolli Hazard MD;  [facility];  Service: Urology;  Laterality: Bilateral;  BILATERAL RETROGRADE   . Prostate biopsy N/A 10/09/2012    Procedure: PROSTATIC URETHRAL BIOPSY;  Surgeon: DMolli Hazard MD;  [facility];  Service: Urology;  Laterality: N/A;  PROSTATIC URETHRAL BIOPSY   . Hernia repair Bilateral     Inguinal  . Mediastinoscopy N/A 05/01/2013    Procedure: MEDIASTINOSCOPY;  Surgeon: BGaye Pollack MD;  [facility];  Service: Thoracic;  Laterality: N/A;  . Video bronchoscopy N/A 05/01/2013    Procedure: VIDEO BRONCHOSCOPY;  Surgeon: BGaye Pollack MD;  [facility];  Service: Thoracic;  Laterality: N/A;  . Coronary angioplasty      no stents  . Thorocotomy with lobectomy Left 05/29/2013    Procedure: LEFT THOROCOTOMY WITH LEFT UPPER LOBE LOBECTOMY;  Surgeon: BGaye Pollack MD;  [facility];  Service: Thoracic;  Laterality: Left;  . Abdominal aortic endovascular stent graft N/A 05/07/2014  Procedure: ABDOMINAL AORTIC ENDOVASCULAR STENT GRAFT;  Surgeon: Serafina Mitchell, MD;  Location: [facility] OR;  Service: Vascular;  Laterality: N/A;  . Eeg  08/05/15    Pt placed on keppra just prior to this test due to having ? seizure (MRI showed brain mets)  . Pfts  04/2013    Minimal obstructive airway disease  . Carotid duplex dopplers  07/2015    1-39% on R, no signif dz noted on L    There were no vitals filed for this visit.      Subjective Assessment - 12/08/15 2005    Subjective Pt arrived using new L Foot Up brace he ordered off [place], stating, "I need help  putting it on right." Pt also states, "I saw the doctor yesterday. He said there was a lot of swelling in my brain from the radiation, so he gave me two new medicines for that. "   Pertinent History PMH significant for: lung cancer s/p lobectomy, chemotherapy and radiation, hyperlipidemia, CAD s/p MI, peripheral vascular disease, abdominal aortic aneurysm   Patient Stated Goals "I want to get stronger in my legs."   Currently in Pain? No/denies                         [facility] Adult PT Treatment/Exercise - 12/08/15 0001    Ambulation/Gait   Ambulation/Gait Yes   Ambulation/Gait Assistance 6: Modified independent (Device/Increase time)   Ambulation Distance (Feet) 300 Feet   Assistive device None;Other (Comment)  L Foot Up brace   Gait Pattern Step-through pattern;Decreased hip/knee flexion - left;Decreased arm swing - left   Ambulation Surface Level;Indoor   Self-Care   Self-Care Other Self-Care Comments   Other Self-Care Comments  Adjusted personal L Foot Up braceon pt's shoe and lower leg to maximize effectiveness. Also, provided cueing for technique with donning brace, initially trying figure-4 position in seated; however, pt unable to achieve position due to limited ROM. Pt was able to successfully don brace with cueing to place L foot on edge of 4" step to maintain L ankle DF while attaching clip.    Exercises   Exercises Other Exercises   Other Exercises  Quadruped elevation of UE and contralateral LE 5 reps x3 sets with verbal/tactile cueing.             Balance Exercises - 12/08/15 2009    Balance Exercises: Standing   Standing Eyes Closed Narrow base of support (BOS);Head turns;Foam/compliant surface;Other reps (comment)  1 pillow; horiz, vertical head turns x10 reps each   Retro Gait 3 reps;Other (comment)  3 x30' with cueing for UE use   Other Standing Exercises LE braiding 2 x30' with each LE leading without UE support; 3 x10' per side with BUE support at  countertop. mCTSIB Condition 4 (EC on foam with narrow BOS) x27 seconds prior to LOB.            PT Education - 12/08/15 2007    Education provided Yes   Education Details Expanded on current HEP; see Pt Instructions.    Person(s) Educated Patient   Methods Explanation;Demonstration;Handout;Tactile cues;Verbal cues   Comprehension Verbalized understanding;Returned demonstration          PT Short Term Goals - 12/01/15 1531    PT SHORT TERM GOAL #1   Title STG's = LTG's            PT Long Term Goals - 12/01/15 2208    PT LONG TERM GOAL #  1   Title Pt will independently perform HEP to maximize functional gains made in PT.   (Target date: 12/15/15)   Baseline Met 4/12.   Status Achieved   PT LONG TERM GOAL #2   Title Pt will improve 6MWT distance from 1,404' to 1,517' to indicate significant improvement in functional endurance.   Status On-going   PT LONG TERM GOAL #3   Title Pt will independently ambulate 500' over unlevel, paved surfaces with no occurrence left foot drag to indicate progress toward PLOF.   Status On-going   PT LONG TERM GOAL #4   Title Pt will perform condition 4 of Modified CTSIB for > / = 30 seconds without LOB to indicate improved vestibular reliance.     Status On-going               Plan - 12/08/15 2015    Clinical Impression Statement Session focused on increasing LE coordination, increasing proximal stability/control, standing balance/dynamic gait, and increasing pt independence donning personal L Foot Up brace. Session was abbreviated due to pt request to end early secondary to significant fatigue.   Rehab Potential Fair   Clinical Impairments Affecting Rehab Potential transportation limitations   PT Frequency 1x / week   PT Duration 4 weeks   PT Treatment/Interventions ADLs/Self Care Home Management;DME Instruction;Balance training;Vestibular;Therapeutic exercise;Orthotic Fit/Training;Therapeutic activities;Patient/family  education;Functional mobility training;Neuromuscular re-education;Gait training;Stair training;Manual techniques   PT Next Visit Plan Assess current HEP and progress prn.  Begin checking LTG's.    Consulted and Agree with Plan of Care Patient      Patient will benefit from skilled therapeutic intervention in order to improve the following deficits and impairments:  Abnormal gait, Cardiopulmonary status limiting activity, Decreased activity tolerance, Decreased balance, Decreased endurance, Decreased strength, Impaired flexibility  Visit Diagnosis: Other abnormalities of gait and mobility  Unsteadiness on feet     Problem List Patient Active Problem List   Diagnosis Date Noted  . Complaints of leg weakness 11/08/2015  . 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 seizures, not intractable, without status epilepticus ([place]) 10/11/2015  . Syncope 08/05/2015  . Seizure-like activity ([place]) 08/04/2015  . Brain metastases ([place]) 08/04/2015  . Seizure ([place]) 08/04/2015  . AAA (abdominal aortic aneurysm) without rupture ([place]) 06/21/2015  . Aftercare following surgery of the circulatory system 06/21/2015  . Essential hypertension 06/23/2014  . Lung cancer ([place]) 06/19/2013  . History of AAA (abdominal aortic aneurysm) repair 10/14/2012  . Aneurysm of common iliac artery ([place]) 10/14/2012  . Elevated PSA 07/10/2012  . CAD (coronary artery disease) 07/31/2011  . DIVERTICULOSIS, COLON 07/05/2010  . COLONIC POLYPS 02/24/2010  . AMBLYOPIA, HX OF 02/24/2010  . HYPERGLYCEMIA, FASTING 05/15/2008  . Hyperlipidemia 05/23/2007  . MYOCARDIAL INFARCTION, HX OF 05/23/2007   Billie Ruddy, PT, DPT [facility] [address] [place], [place], [address] Phone: [phone]   Fax:  [phone] 12/08/2015, 8:22 PM  Name: Jordan Mccullough MRN: [id number] Date of Birth: 1941/01/12

## 2015-12-08 NOTE — Patient Instructions (Addendum)
(  All-Fours)    Raise right arm and opposite leg. Hold for 1-2 seconds, then return to resting position. Repeat, but raise left arm and opposite leg.  Perform __5__ times per set. Do __2-3__ sets per day.   Lower Extremity Braiding   Start  By facing your kitchen island; place both hands on [place].  Move to side: 1) cross right leg in front of left, 2) bring back leg out to side, then 3) cross right leg behind left, 4) bring left leg out to side. Continue sequence in same direction. Reverse sequence, moving in opposite direction.   Repeat sequence __2-3__ times around your island with right leg leading, __2-3__ times around your island with left leg leading.

## 2015-12-10 ENCOUNTER — Other Ambulatory Visit: Payer: Self-pay

## 2015-12-13 ENCOUNTER — Encounter (HOSPITAL_COMMUNITY): Payer: Self-pay

## 2015-12-13 ENCOUNTER — Ambulatory Visit (HOSPITAL_COMMUNITY)
Admission: RE | Admit: 2015-12-13 | Discharge: 2015-12-13 | Disposition: A | Discharge status: Home / Self Care | Payer: Medicare Other | Source: Ambulatory Visit | Attending: Surgery | Admitting: Surgery

## 2015-12-13 ENCOUNTER — Ambulatory Visit (HOSPITAL_COMMUNITY): Admission: RE | Admit: 2015-12-13 | Payer: Medicare Other | Source: Ambulatory Visit

## 2015-12-13 DIAGNOSIS — Z48812 Encounter for surgical aftercare following surgery on the circulatory system: Secondary | ICD-10-CM | POA: Diagnosis present

## 2015-12-13 DIAGNOSIS — I714 Abdominal aortic aneurysm, without rupture, unspecified: Secondary | ICD-10-CM

## 2015-12-13 DIAGNOSIS — Z902 Acquired absence of lung [part of]: Secondary | ICD-10-CM | POA: Insufficient documentation

## 2015-12-13 MED ORDER — IOPAMIDOL (ISOVUE-370) INJECTION 76%
100.0000 mL | Freq: Once | INTRAVENOUS | Status: AC | PRN
  Administered 2015-12-13: 100 mL via INTRAVENOUS

## 2015-12-14 ENCOUNTER — Encounter: Payer: Self-pay | Admitting: Surgery

## 2015-12-15 ENCOUNTER — Ambulatory Visit (INDEPENDENT_AMBULATORY_CARE_PROVIDER_SITE_OTHER): Payer: Medicare Other | Admitting: Surgery

## 2015-12-15 ENCOUNTER — Encounter: Payer: Self-pay | Admitting: Surgery

## 2015-12-15 ENCOUNTER — Ambulatory Visit: Payer: Medicare Other | Admitting: Physical Therapy

## 2015-12-15 VITALS — BP 140/95 | HR 76 | Resp 80 | Ht 64.0 in | Wt 148.0 lb

## 2015-12-15 DIAGNOSIS — C3412 Malignant neoplasm of upper lobe, left bronchus or lung: Secondary | ICD-10-CM

## 2015-12-15 DIAGNOSIS — Z902 Acquired absence of lung [part of]: Secondary | ICD-10-CM

## 2015-12-15 NOTE — Progress Notes (Signed)
HPI:  The patient is a 75 year old long time heavy smoker who underwent left upper lobectomy and mediastinal lymph node dissection by me on 05/29/2013 by me. There were enlarged mediastinal and hilar lymph nodes with the largest being a right paratracheal node measuring 10 mm. A PET scan showed the nodule to have an SUV of 2.4 suspicious for bronchogenic carcinoma. There was an 8 mm AP window lymph node with a SUV of 5.0. There was focal hypermetabolism in the left hilum with an SUV of 4.3 that was concerning for lymph node mets. He underwent mediastinoscopy and the mediastinal lymph nodes were negative.The final pathology after right upper lobectomy showed stage IIIA (T2a, N2, M0) adenocarcinoma with negative EGFR and ALK. He was treated with adjuvant chemo with cisplatin and Alimta for 4 cycles as well as curative radiotherapy to the mediastinum by Dr. Lisbeth Renshaw. He has been followed by Dr. Julien Nordmann and a  CT of the chest in October 2016 showed some soft tissue thickening in the left hilum that was new since a CT in April 2016. He had a PET scan on 07/02/2015 that showed the fullness in his left hilum seen on recent CT scan had resolved and was most likely due to mucous plugging in the left lower lobe bronchi in this area. There were two small areas of hypermetabolic uptake along a left hilar clip and in the left infrahilar area both of which have no CT correlate and were felt to be inflammatory in this heavy smoker. There was focal hypermetabolic activity in the left posterior ninth rib without CT correlate and the patient related that he recently hurt his back in this region and had muscle spasm and difficulty getting up for a few days. We decided to follow this since his left hilar fullness had resolved. Then in 07/2015 he presented with a seizure and a MRI of the brain showed two brain mets. He subsequently underwent brain radiotherapy by Dr. Lisbeth Renshaw and Dr. Sherwood Gambler. He says that he feels generally weak  with focal weakness in the left arm and leg. He continues to smoke 1 ppd.     Current Outpatient Prescriptions  Medication Sig Dispense Refill  . amLODipine (NORVASC) 5 MG tablet TAKE 1 TABLET (5 MG TOTAL) BY MOUTH DAILY. 90 tablet 3  . aspirin EC 81 MG tablet Take 81 mg by mouth daily.    . budesonide-formoterol (SYMBICORT) 160-4.5 MCG/ACT inhaler Inhale 1-2 puffs into the lings every 12 hours. Gargle and Spit after use. --- Please establish with new PCP for further refills 10.2 Inhaler 0  . Glucosamine-Chondroit-Vit C-Mn (GLUCOSAMINE 1500 COMPLEX PO) Take 1,500 mg by mouth daily.     Marland Kitchen levETIRAcetam (KEPPRA XR) 500 MG 24 hr tablet Take 1 tablet every night 90 tablet 3  . metoprolol (LOPRESSOR) 50 MG tablet Take 1 tablet (50 mg total) by mouth 2 (two) times daily. 180 tablet 3  . Multiple Vitamin (MULTIVITAMIN) tablet Take 1 tablet by mouth every evening.     . pentoxifylline (TRENTAL) 400 MG CR tablet Take 1 tablet (400 mg total) by mouth 3 (three) times daily. 90 tablet 5  . rosuvastatin (CRESTOR) 40 MG tablet TAKE 1 TABLET (40 MG TOTAL) BY MOUTH DAILY. 30 tablet 6  . vitamin E 1000 UNIT capsule Take 1 capsule (1,000 Units total) by mouth daily. 30 capsule 12   No current facility-administered medications for this visit.     Physical Exam: BP 140/95 mmHg  Pulse 76  Resp 80  Ht 5' 4"  (1.626 m)  Wt 148 lb (67.132 kg)  BMI 25.39 kg/m2  SpO2 97% Looks weaker than when I last saw him. Gait is slower but steady Lungs: diffusely decreased breath sounds No cervical or supraclavicular adenopathy. Abdomen: soft and non-tender, non-distended  Diagnostic Tests:  CLINICAL DATA: 75 year old male with a history of abdominal aortic aneurysm status post endovascular aortic repair complicated by a type 2 endoleak. Patient also has a history of metastatic lung cancer status post surgical resection and chemo radiation therapy.  EXAM: CT ANGIOGRAPHY CHEST, ABDOMEN AND  PELVIS  TECHNIQUE: Multidetector CT imaging through the chest, abdomen and pelvis was performed using the standard protocol during bolus administration of intravenous contrast. Multiplanar reconstructed images and MIPs were obtained and reviewed to evaluate the vascular anatomy.  CONTRAST: 100 mL Isovue 370  COMPARISON: None.  FINDINGS: CTA CHEST FINDINGS  Mediastinum: Unremarkable CT appearance of the thyroid gland. No suspicious mediastinal or hilar adenopathy. Surgical changes of prior left upper lobectomy with multiple surgical clips in the left hilar region consistent with mediastinal node dissection. No soft tissue mediastinal mass. The thoracic esophagus is unremarkable.  Heart/Vascular: Normal caliber thoracic aorta. Two vessel arch anatomy. The brachiocephalic and left common carotid artery share a common origin. The arch vessels are highly tortuous but there is no evidence of aneurysm, dissection or significant stenosis. The left vertebral artery is dominant. Normal caliber main pulmonary artery. The heart is normal in size. No pericardial effusion. Trace calcifications along the left anterior descending coronary artery.  Lungs/Pleura: Surgical changes of left upper lobectomy. Post radiation changes are present centrally within the left upper lobe. Previously noted mucous plugging and resultant fullness in the left hilum remains resolved. No new pulmonary mass or nodule.  Bones/Soft Tissues: Subtle sclerosis in the posterolateral aspect of the left ninth rib in the region of prior hypermetabolic activity. No definite cortical breakthrough or surrounding soft tissue mass.  Review of the MIP images confirms the above findings.  CTA ABDOMEN AND PELVIS FINDINGS  VASCULAR  Aorta: Fusiform abdominal aortic aneurysm remains essentially unchanged at 5.3 x 5.1 cm (confirmed on coronal and sagittal reformatted images). Persistent type 2 endoleak originating  from the inferior mesenteric artery the degree of contrast material in the excluded aneurysm sac is not significantly changed compared to prior. The endovascular aortic repair remains stable extending from just below the renal arteries into the bilateral common iliac arteries. No evidence of graft migration or limb occlusion.  Celiac: Patent and unremarkable. No visceral artery aneurysm.  SMA: Patent and unremarkable.  Renals: Solitary left renal artery is unremarkable. On the right, there is a dominant main renal artery with a small anterior accessory artery arising inferiorly from the aorta distal to the origin of the IMA.  IMA: Patent and normal in caliber.  Inflow: Both internal iliac arteries remain patent. The iliac arteries are mildly globally ectatic. Scattered atherosclerotic vascular calcification without evidence of aneurysm, dissection or significant stenosis.  Proximal Outflow: Mild atherosclerotic calcifications without significant stenosis.  Veins: No focal venous abnormality.  NON-VASCULAR  Abdomen: Unremarkable CT appearance of the stomach, duodenum, spleen, adrenal glands and pancreas.  Normal hepatic contour morphology. Stable small (subcentimeter) enhancing lesion in the dome of the right liver likely representing a small hemangioma. Cholelithiasis without intra or extrahepatic biliary ductal dilatation.  Unremarkable appearance of the bilateral kidneys. No focal solid lesion, hydronephrosis or nephrolithiasis. Stable renal cysts. Developing atrophy of the lower pole of the right kidney.  Pelvis: Prostatomegaly with internal patchy enhancement, unchanged. Fat containing left inguinal hernia. Persistent mild thickening of the bladder wall.  Bones/Soft Tissues: Interval development of L4 and L5 superior endplate compression fractures compared to 06/07/2015.  Review of the MIP images confirms the above findings.  IMPRESSION: CTA  CHEST  1. Subtle focal sclerosis in the antral lateral aspect of the left ninth rib in the region of previously noted hypermetabolic activity. There are no definitive aggressive features, evidence of fracture or soft tissue mass. Differential considerations include very early sclerotic metastasis with minimal change compared to prior imaging, or sequelae of prior stress reaction/radiation change. Recommend continued attention on follow-up imaging. 2. Stable changes of prior left upper lobectomy and central left lower lobe radiation changes. Interval clearing of mucous plugging and resultant soft tissue thickening in the left hilar bronchial structures. CTA ABD/PELVIS  1. Continued stability of infrarenal abdominal aortic aneurysm status post EVAR complicated by a type 2 endoleak. No interval change in the excluded aneurysm sac which remains 5.3 x 5.1 cm. 2. Developing cortical atrophy of the lower pole of the right kidney likely secondary to stent graft coverage of the accessory right lower pole renal artery. 3. Interval development of L4 on L5 superior endplate compression fractures. 4. Additional ancillary findings as above without significant interval change.  Signed,  Criselda Peaches, MD  [facility]  [facility]   Electronically Signed  By: Jacqulynn Cadet M.D.  On: 12/13/2015 10:21  Impression:  There are stable changes in the left chest from prior left upper lobectomy. The left hilum looks stable with no change in thickness of the tissues. There is mild sclerosis in the left ninth rib in the region of previously noted hypermetabolic activity and it is unclear if this is related to previous trauma, effects of XRT or metastatic disease. I think his prognosis is likely to be determined by the course of his brain mets.   Plan:  He will continue to follow up with Dr. Julien Nordmann, Dr. Lisbeth Renshaw, and Dr. Sherwood Gambler  concerning his metastatic lung cancer. He has a follow up appt with Dr. Trula Slade concerning an EVAR endoleak. I will be happy to see him back if oncology feels there is a need.   Gaye Pollack, MD [facility] [phone]

## 2015-12-16 ENCOUNTER — Telehealth: Payer: Self-pay | Admitting: Internal Medicine

## 2015-12-16 ENCOUNTER — Other Ambulatory Visit (HOSPITAL_BASED_OUTPATIENT_CLINIC_OR_DEPARTMENT_OTHER): Payer: Medicare Other

## 2015-12-16 ENCOUNTER — Encounter: Payer: Self-pay | Admitting: Internal Medicine

## 2015-12-16 ENCOUNTER — Ambulatory Visit (HOSPITAL_BASED_OUTPATIENT_CLINIC_OR_DEPARTMENT_OTHER): Payer: Medicare Other | Admitting: Internal Medicine

## 2015-12-16 VITALS — BP 143/79 | HR 73 | Temp 98.0°F | Resp 17 | Ht 64.0 in | Wt 148.2 lb

## 2015-12-16 DIAGNOSIS — C3412 Malignant neoplasm of upper lobe, left bronchus or lung: Secondary | ICD-10-CM

## 2015-12-16 DIAGNOSIS — R5383 Other fatigue: Secondary | ICD-10-CM

## 2015-12-16 DIAGNOSIS — C7931 Secondary malignant neoplasm of brain: Secondary | ICD-10-CM | POA: Diagnosis not present

## 2015-12-16 DIAGNOSIS — R531 Weakness: Secondary | ICD-10-CM

## 2015-12-16 LAB — COMPREHENSIVE METABOLIC PANEL
ALT: 19 U/L (ref 0–55)
AST: 17 U/L (ref 5–34)
Albumin: 3.7 g/dL (ref 3.5–5.0)
Alkaline Phosphatase: 53 U/L (ref 40–150)
Anion Gap: 8 mEq/L (ref 3–11)
BUN: 9.4 mg/dL (ref 7.0–26.0)
CO2: 26 mEq/L (ref 22–29)
Calcium: 9.7 mg/dL (ref 8.4–10.4)
Chloride: 107 mEq/L (ref 98–109)
Creatinine: 0.8 mg/dL (ref 0.7–1.3)
EGFR: 87 mL/min/{1.73_m2} — ABNORMAL LOW (ref >90)
Glucose: 113 mg/dl (ref 70–140)
Potassium: 4.3 mEq/L (ref 3.5–5.1)
Sodium: 141 mEq/L (ref 136–145)
Total Bilirubin: 0.36 mg/dL (ref 0.20–1.20)
Total Protein: 7 g/dL (ref 6.4–8.3)

## 2015-12-16 LAB — CBC WITH DIFFERENTIAL/PLATELET
BASO%: 0.8 % (ref 0.0–2.0)
Basophils Absolute: 0.1 10*3/uL (ref 0.0–0.1)
EOS%: 1.7 % (ref 0.0–7.0)
Eosinophils Absolute: 0.1 10*3/uL (ref 0.0–0.5)
HCT: 42.9 % (ref 38.4–49.9)
HGB: 14.3 g/dL (ref 13.0–17.1)
LYMPH%: 14.9 % (ref 14.0–49.0)
MCH: 29.1 pg (ref 27.2–33.4)
MCHC: 33.3 g/dL (ref 32.0–36.0)
MCV: 87.4 fL (ref 79.3–98.0)
MONO#: 0.6 10*3/uL (ref 0.1–0.9)
MONO%: 8.1 % (ref 0.0–14.0)
NEUT#: 5.7 10*3/uL (ref 1.5–6.5)
NEUT%: 74.5 % (ref 39.0–75.0)
Platelets: 220 10*3/uL (ref 140–400)
RBC: 4.91 10*6/uL (ref 4.20–5.82)
RDW: 14.9 % — ABNORMAL HIGH (ref 11.0–14.6)
WBC: 7.7 10*3/uL (ref 4.0–10.3)
lymph#: 1.1 10*3/uL (ref 0.9–3.3)

## 2015-12-16 NOTE — Telephone Encounter (Signed)
spoke w/ wife confirmed july appt

## 2015-12-16 NOTE — Progress Notes (Signed)
[place]  [phone] [phone]) [phone] OFFICE PROGRESS NOTE  Jordan Sou, MD [address] [place] [address]  DIAGNOSIS: Metastatic non-small cell lung cancer initially diagnosed as Stage IIIA (T2a., N2, M0) non-small cell lung cancer adenocarcinoma with negative EGFR mutation and negative ALK gene translocation involving the left upper lobe middle mediastinal lymphadenopathy diagnosed in August of 2014. He had metastatic disease to the brain diagnosed in December 2016.  Lung cancer   Primary site: Lung (Left)   Staging method: AJCC 7th Edition   Clinical: Stage IIIA (T2a, N2, M0) signed by [name], MD on 06/19/2013  5:06 PM   Summary: Stage IIIA (T2a, N2, M0).  Molecular biomarkers: Positive for [id number], [id number] ATM splice site [id number] TP53 E271K, SETD2 [id number] and SPOP E47K                                          Negative for: RET, ALK, BRAF, KRAS, ERBB2, MET and EGFR.  PRIOR THERAPY:  1) Status post left upper lobectomy with mediastinal lymph node dissection on 05/29/2013. 2) Adjuvant chemotherapy with cisplatin at 75 mg per meter squared and Alimta at 500 mg per meter squared given every 3 weeks for a total of 4 cycles. First cycle given on 07/10/2013 3) curative radiotherapy to the mediastinum under the care of Dr. [name] on 12/12/2013. 4) status post stereotactic radiotherapy to the metastatic brain lesion on 08/25/2015 under the care of Dr. [name] CURRENT THERAPY: Observation.  DISEASE STAGE: Lung cancer   Primary site: Lung (Left)   Staging method: AJCC 7th Edition   Clinical: Stage IIIA (T2a, N2, M0) signed by [name] on 06/19/2013  5:06 PM   Summary: Stage IIIA (T2a, N2, M0)  CHEMOTHERAPY INTENT: Control/curative  CURRENT # OF CHEMOTHERAPY CYCLES: 0  CURRENT ANTIEMETICS: Aloxi, dexamethasone, Emend, Compazine  CURRENT SMOKING STATUS: Former smoker  ORAL  CHEMOTHERAPY AND CONSENT: n/a  CURRENT BISPHOSPHONATES USE: none  PAIN MANAGEMENT: 0/10  NARCOTICS INDUCED CONSTIPATION: None  LIVING WILL AND CODE STATUS: Full code   INTERVAL HISTORY: Jordan CHIOU89.o. male returns for 4 month followup visit accompanied by his wife. The patient is feeling fine today with no specific complaints except for fatigue and weakness in his legs. His recent MRI of the brain demonstrate mild increase in size and vasogenic edema around the treated brain lesions. He was seen by Dr. NuRita Oharand started on treatment with Trental. He is otherwise feeling fine. He denied having any significant nausea or vomiting, no fever or chills. The patient denied having any significant chest pain, shortness of breath, or hemoptysis. He had repeat CT scan of the chest, abdomen and pelvis performed recently and he is here for evaluation and discussion of his scan results.  MEDICAL HISTORY: Past Medical History  Diagnosis Date  . Hyperlipidemia   . Diverticulosis   . Coronary artery disease     MI 1992, S/P  PTCA; negative stress test in November 2011 with no ischemia.   . Peripheral vascular disease ([facility]    4.8x4.6 cm infrarenal abdominal aortic fusiform aneurysm, 1.5 cm right common iliac artery aneurysm  . AAA (abdominal aortic aneurysm) ([facility]  . Hearing loss     Bilateral   . [facility] [name]  . History of radiation  therapy 11/10/13-12/12/13    lung,50Gy/28f  . Hypertension   . Shortness of breath   . Tobacco dependence     "Quit" 1992, but pt has smoked "on and off" since that time  . Asymptomatic cholelithiasis 07/2015    Incidental finding on PET CT  . [facility]    Stereotactic radiation therapy: frontoparietal 18gy,posterior frontal lobe 20gy,  . Lung cancer ([facility] 10-14    non-small cell;  L upper lobectomy with mediastinal LN dissection, chemo, and radiation in 2014/2015. Remission until pt had  questionable seizure 07/2015--MRI showed brain mets; palliative brain rad (stereotactic radiation therapy) started 08/25/15    ALLERGIES:  is allergic to fluvirin and iodinated diagnostic agents.  MEDICATIONS:  Current Outpatient Prescriptions  Medication Sig Dispense Refill  . amLODipine (NORVASC) 5 MG tablet TAKE 1 TABLET (5 MG TOTAL) BY MOUTH DAILY. 90 tablet 3  . aspirin EC 81 MG tablet Take 81 mg by mouth daily.    . budesonide-formoterol (SYMBICORT) 160-4.5 MCG/ACT inhaler Inhale 1-2 puffs into the lings every 12 hours. Gargle and Spit after use. --- Please establish with new PCP for further refills 10.2 Inhaler 0  . Glucosamine-Chondroit-Vit C-Mn (GLUCOSAMINE 1500 COMPLEX PO) Take 1,500 mg by mouth daily.     .Marland KitchenlevETIRAcetam (KEPPRA XR) 500 MG 24 hr tablet Take 1 tablet every night 90 tablet 3  . metoprolol (LOPRESSOR) 50 MG tablet Take 1 tablet (50 mg total) by mouth 2 (two) times daily. 180 tablet 3  . Multiple Vitamin (MULTIVITAMIN) tablet Take 1 tablet by mouth every evening.     . pentoxifylline (TRENTAL) 400 MG CR tablet Take 1 tablet (400 mg total) by mouth 3 (three) times daily. 90 tablet 5  . rosuvastatin (CRESTOR) 40 MG tablet TAKE 1 TABLET (40 MG TOTAL) BY MOUTH DAILY. 30 tablet 6  . vitamin E 1000 UNIT capsule Take 1 capsule (1,000 Units total) by mouth daily. 30 capsule 12   No current facility-administered medications for this visit.    SURGICAL HISTORY:  Past Surgical History  Procedure Laterality Date  . Ptca  1992  . Inguinal heriiorrhaphy bilaterally    . Colonoscopy w/ polypectomy  2003     negative 2010,due 2020; Dr BOlevia Perches . Pilonidal cyst excision    . Rotator cuff repair      Bilateral  . Cystoscopy/retrograde/ureteroscopy Bilateral 10/09/2012    Procedure: BILATERAL RETROGRADE bladder and urethral BIOPSY ;  Surgeon: DMolli Hazard MD;  Location: [facility];  Service: Urology;  Laterality: Bilateral;  BILATERAL RETROGRADE   . Prostate biopsy N/A  10/09/2012    Procedure: PROSTATIC URETHRAL BIOPSY;  Surgeon: DMolli Hazard MD;  Location: [facility];  Service: Urology;  Laterality: N/A;  PROSTATIC URETHRAL BIOPSY   . Hernia repair Bilateral     Inguinal  . Mediastinoscopy N/A 05/01/2013    Procedure: MEDIASTINOSCOPY;  Surgeon: BGaye Pollack MD;  [facility];  Service: Thoracic;  Laterality: N/A;  . Video bronchoscopy N/A 05/01/2013    Procedure: VIDEO BRONCHOSCOPY;  Surgeon: BGaye Pollack MD;  [facility];  Service: Thoracic;  Laterality: N/A;  . Coronary angioplasty      no stents  . Thorocotomy with lobectomy Left 05/29/2013    Procedure: LEFT THOROCOTOMY WITH LEFT UPPER LOBE LOBECTOMY;  Surgeon: BGaye Pollack MD;  [facility];  Service: Thoracic;  Laterality: Left;  . Abdominal aortic endovascular stent graft N/A 05/07/2014    Procedure: ABDOMINAL AORTIC ENDOVASCULAR STENT  GRAFT;  Surgeon: Serafina Mitchell, MD;  [facility];  Service: Vascular;  Laterality: N/A;  . Eeg  08/05/15    Pt placed on keppra just prior to this test due to having ? seizure (MRI showed brain mets)  . Pfts  04/2013    Minimal obstructive airway disease  . Carotid duplex dopplers  07/2015    1-39% on R, no signif dz noted on L    REVIEW OF SYSTEMS:  A comprehensive review of systems was negative except for: Constitutional: positive for fatigue Musculoskeletal: positive for muscle weakness   PHYSICAL EXAMINATION: General appearance: alert, cooperative, appears stated age and no distress Head: Normocephalic, without obvious abnormality, atraumatic Neck: no adenopathy, no carotid bruit, no JVD, supple, symmetrical, trachea midline and thyroid not enlarged, symmetric, no tenderness/mass/nodules Lymph nodes: Cervical, supraclavicular, and axillary nodes normal. Resp: clear to auscultation bilaterally Cardio: S1, S2 normal and Occasional missed/skipped beat(s) GI: soft, non-tender; bowel sounds normal; no masses,  no  organomegaly Extremities: extremities normal, atraumatic, no cyanosis or edema Neurologic: Alert and oriented X 3, normal strength and tone. Normal symmetric reflexes. Normal coordination and gait  ECOG PERFORMANCE STATUS: 1 - Symptomatic but completely ambulatory  Blood pressure 143/79, pulse 73, temperature 98 F (36.7 C), temperature source Oral, resp. rate 17, height 5' 4"  (1.626 m), weight 148 lb 3.2 oz (67.223 kg), SpO2 97 %.  LABORATORY DATA: Lab Results  Component Value Date   WBC 7.7 12/16/2015   HGB 14.3 12/16/2015   HCT 42.9 12/16/2015   MCV 87.4 12/16/2015   PLT 220 12/16/2015      Chemistry      Component Value Date/Time   NA 140 09/16/2015 0927   NA 141 07/07/2015 1250   K 4.9 09/16/2015 0927   K 5.0 07/07/2015 1250   CL 101 09/16/2015 0927   CO2 33* 09/16/2015 0927   CO2 28 07/07/2015 1250   BUN 30* 09/16/2015 0927   BUN 15.1 07/07/2015 1250   CREATININE 0.83 12/06/2015 1458   CREATININE 0.71 09/16/2015 0927   CREATININE 1.0 07/07/2015 1250      Component Value Date/Time   CALCIUM 9.1 09/16/2015 0927   CALCIUM 10.0 07/07/2015 1250   ALKPHOS 48 09/16/2015 0927   ALKPHOS 67 07/07/2015 1250   AST 21 09/16/2015 0927   AST 21 07/07/2015 1250   ALT 49 09/16/2015 0927   ALT 23 07/07/2015 1250   BILITOT 0.4 09/16/2015 0927   BILITOT 0.34 07/07/2015 1250       RADIOGRAPHIC STUDIES: Mr Kizzie Fantasia Contrast  2015-12-17  CLINICAL DATA:  75 year old male with non-small cell lung cancer adenocarcinoma, metastatic to brain status post stereotactic radiosurgery in January 2017: - Rt Vertex Frontoparietal 87m target was treated using 4 Arcs to a prescription dose of 18 Gy - Rt Posterior Frontal Lobe 121mtarget was treated using 4 Arcs to a prescription dose of 20 Gy. Restaging.  Subsequent encounter. [facility] [address] Results: Creatinine 0.7 mg/dL. EXAM: MRI HEAD WITHOUT AND WITH CONTRAST TECHNIQUE:  Multiplanar, multiecho pulse sequences of the brain and surrounding structures were obtained without and with intravenous contrast. CONTRAST:  14 mL MultiHance COMPARISON:  Pretreatment MRI 08/12/2015 FINDINGS: The larger treated metastasis in the right motor strip at the vertex appears slightly larger than in December and demonstrates a thicker rim of enhancement (series 10, image 132). The lesion measures 19 x 23-25 mm today versus 18 - 22 mm  previously. Surrounding T2 and FLAIR hyperintensity in a vasogenic edema pattern has more than doubled (series 7, image 21 today versus series 7 image 23 in December). Mild regional mass effect. The increased T2 and FLAIR hyperintensity is contiguous with the second treated lesion along the more lateral aspect of the right sensory strip. This lesion also appears slightly larger today with the thicker rim of enhancement measuring 11 x 13 mm (previously 8 x 11 mm). Previously there was only minimal surrounding T2 and FLAIR hyperintensity here, now that signal abnormality tracks from the posterior right parietal lobe inferiorly to the posterior aspect of the right frontal operculum. There is new mild mass effect on the posterior body of the right lateral ventricle from the progressed T2/FLAIR hyperintense signal. Neither lesion appears hemorrhagic. There is mild restricted diffusion in the rims of both lesions. Diffusion centrally is facilitated. No new brain metastasis identified.  No dural thickening. No superimposed restricted diffusion suggestive of acute ischemia. No ventriculomegaly. No acute intracranial hemorrhage identified. No other intracranial mass effect. Basilar cisterns remain normal. Negative pituitary, cervicomedullary junction, visualized cervical spine and spinal cord. Major intracranial vascular flow voids are within normal limits. No extra-axial collection. Visible internal auditory structures appear normal. Mastoids and paranasal sinuses remain clear.  Negative orbit and scalp soft tissues. Visualized bone marrow signal is normal. IMPRESSION: 1. Both of the treated right peri-[place]ic region metastases have mildly increased in size and demonstrate significant increased surrounding abnormal T2 and FLAIR hyperintensity in a vasogenic edema pattern. New mild mass effect on the posterior right lateral ventricle. 2. Favor post treatment effect rather than true progression of disease. 3. No new brain metastasis. Electronically Signed   By: Genevie Ann M.D.   On: 12/03/2015 14:19   Ct Angio Chest Aorta W/cm &/or Wo/cm  12/13/2015  CLINICAL DATA:  75 year old male with a history of abdominal aortic aneurysm status post endovascular aortic repair complicated by a type 2 endoleak. Patient also has a history of metastatic lung cancer status post surgical resection and chemo radiation therapy. EXAM: CT ANGIOGRAPHY CHEST, ABDOMEN AND PELVIS TECHNIQUE: Multidetector CT imaging through the chest, abdomen and pelvis was performed using the standard protocol during bolus administration of intravenous contrast. Multiplanar reconstructed images and MIPs were obtained and reviewed to evaluate the vascular anatomy. CONTRAST:  100 mL Isovue 370 COMPARISON:  None. FINDINGS: CTA CHEST FINDINGS Mediastinum: Unremarkable CT appearance of the thyroid gland. No suspicious mediastinal or hilar adenopathy. Surgical changes of prior left upper lobectomy with multiple surgical clips in the left hilar region consistent with mediastinal node dissection. No soft tissue mediastinal mass. The thoracic esophagus is unremarkable. Heart/Vascular: Normal caliber thoracic aorta. Two vessel arch anatomy. The brachiocephalic and left common carotid artery share a common origin. The arch vessels are highly tortuous but there is no evidence of aneurysm, dissection or significant stenosis. The left vertebral artery is dominant. Normal caliber main pulmonary artery. The heart is normal in size. No pericardial  effusion. Trace calcifications along the left anterior descending coronary artery. Lungs/Pleura: Surgical changes of left upper lobectomy. Post radiation changes are present centrally within the left upper lobe. Previously noted mucous plugging and resultant fullness in the left hilum remains resolved. No new pulmonary mass or nodule. Bones/Soft Tissues: Subtle sclerosis in the posterolateral aspect of the left ninth rib in the region of prior hypermetabolic activity. No definite cortical breakthrough or surrounding soft tissue mass. Review of the MIP images confirms the above findings. CTA ABDOMEN AND PELVIS FINDINGS VASCULAR  Aorta: Fusiform abdominal aortic aneurysm remains essentially unchanged at 5.3 x 5.1 cm (confirmed on coronal and sagittal reformatted images). Persistent type 2 endoleak originating from the inferior mesenteric artery the degree of contrast material in the excluded aneurysm sac is not significantly changed compared to prior. The endovascular aortic repair remains stable extending from just below the renal arteries into the bilateral common iliac arteries. No evidence of graft migration or limb occlusion. Celiac: Patent and unremarkable.  No visceral artery aneurysm. SMA: Patent and unremarkable. Renals: Solitary left renal artery is unremarkable. On the right, there is a dominant main renal artery with a small anterior accessory artery arising inferiorly from the aorta distal to the origin of the IMA. IMA: Patent and normal in caliber. Inflow: Both internal iliac arteries remain patent. The iliac arteries are mildly globally ectatic. Scattered atherosclerotic vascular calcification without evidence of aneurysm, dissection or significant stenosis. Proximal Outflow: Mild atherosclerotic calcifications without significant stenosis. Veins: No focal venous abnormality. NON-VASCULAR Abdomen: Unremarkable CT appearance of the stomach, duodenum, spleen, adrenal glands and pancreas. Normal hepatic  contour morphology. Stable small (subcentimeter) enhancing lesion in the dome of the right liver likely representing a small hemangioma. Cholelithiasis without intra or extrahepatic biliary ductal dilatation. Unremarkable appearance of the bilateral kidneys. No focal solid lesion, hydronephrosis or nephrolithiasis. Stable renal cysts. Developing atrophy of the lower pole of the right kidney. Pelvis: Prostatomegaly with internal patchy enhancement, unchanged. Fat containing left inguinal hernia. Persistent mild thickening of the bladder wall. Bones/Soft Tissues: Interval development of L4 and L5 superior endplate compression fractures compared to 06/07/2015. Review of the MIP images confirms the above findings. IMPRESSION: CTA CHEST 1. Subtle focal sclerosis in the antral lateral aspect of the left ninth rib in the region of previously noted hypermetabolic activity. There are no definitive aggressive features, evidence of fracture or soft tissue mass. Differential considerations include very early sclerotic metastasis with minimal change compared to prior imaging, or sequelae of prior stress reaction/radiation change. Recommend continued attention on follow-up imaging. 2. Stable changes of prior left upper lobectomy and central left lower lobe radiation changes. Interval clearing of mucous plugging and resultant soft tissue thickening in the left hilar bronchial structures. CTA ABD/PELVIS 1. Continued stability of infrarenal abdominal aortic aneurysm status post EVAR complicated by a type 2 endoleak. No interval change in the excluded aneurysm sac which remains 5.3 x 5.1 cm. 2. Developing cortical atrophy of the lower pole of the right kidney likely secondary to stent graft coverage of the accessory right lower pole renal artery. 3. Interval development of L4 on L5 superior endplate compression fractures. 4. Additional ancillary findings as above without significant interval change. Signed, Criselda Peaches, MD  [facility] Electronically Signed   By: Jacqulynn Cadet M.D.   On: 12/13/2015 10:21   Ct Angio Abd/pel W/ And/or W/o  12/13/2015  CLINICAL DATA:  75 year old male with a history of abdominal aortic aneurysm status post endovascular aortic repair complicated by a type 2 endoleak. Patient also has a history of metastatic lung cancer status post surgical resection and chemo radiation therapy. EXAM: CT ANGIOGRAPHY CHEST, ABDOMEN AND PELVIS TECHNIQUE: Multidetector CT imaging through the chest, abdomen and pelvis was performed using the standard protocol during bolus administration of intravenous contrast. Multiplanar reconstructed images and MIPs were obtained and reviewed to evaluate the vascular anatomy. CONTRAST:  100 mL Isovue 370 COMPARISON:  None. FINDINGS: CTA CHEST FINDINGS Mediastinum: Unremarkable CT appearance of the thyroid gland. No suspicious  mediastinal or hilar adenopathy. Surgical changes of prior left upper lobectomy with multiple surgical clips in the left hilar region consistent with mediastinal node dissection. No soft tissue mediastinal mass. The thoracic esophagus is unremarkable. Heart/Vascular: Normal caliber thoracic aorta. Two vessel arch anatomy. The brachiocephalic and left common carotid artery share a common origin. The arch vessels are highly tortuous but there is no evidence of aneurysm, dissection or significant stenosis. The left vertebral artery is dominant. Normal caliber main pulmonary artery. The heart is normal in size. No pericardial effusion. Trace calcifications along the left anterior descending coronary artery. Lungs/Pleura: Surgical changes of left upper lobectomy. Post radiation changes are present centrally within the left upper lobe. Previously noted mucous plugging and resultant fullness in the left hilum remains resolved. No new pulmonary mass or nodule. Bones/Soft Tissues: Subtle sclerosis in the  posterolateral aspect of the left ninth rib in the region of prior hypermetabolic activity. No definite cortical breakthrough or surrounding soft tissue mass. Review of the MIP images confirms the above findings. CTA ABDOMEN AND PELVIS FINDINGS VASCULAR Aorta: Fusiform abdominal aortic aneurysm remains essentially unchanged at 5.3 x 5.1 cm (confirmed on coronal and sagittal reformatted images). Persistent type 2 endoleak originating from the inferior mesenteric artery the degree of contrast material in the excluded aneurysm sac is not significantly changed compared to prior. The endovascular aortic repair remains stable extending from just below the renal arteries into the bilateral common iliac arteries. No evidence of graft migration or limb occlusion. Celiac: Patent and unremarkable.  No visceral artery aneurysm. SMA: Patent and unremarkable. Renals: Solitary left renal artery is unremarkable. On the right, there is a dominant main renal artery with a small anterior accessory artery arising inferiorly from the aorta distal to the origin of the IMA. IMA: Patent and normal in caliber. Inflow: Both internal iliac arteries remain patent. The iliac arteries are mildly globally ectatic. Scattered atherosclerotic vascular calcification without evidence of aneurysm, dissection or significant stenosis. Proximal Outflow: Mild atherosclerotic calcifications without significant stenosis. Veins: No focal venous abnormality. NON-VASCULAR Abdomen: Unremarkable CT appearance of the stomach, duodenum, spleen, adrenal glands and pancreas. Normal hepatic contour morphology. Stable small (subcentimeter) enhancing lesion in the dome of the right liver likely representing a small hemangioma. Cholelithiasis without intra or extrahepatic biliary ductal dilatation. Unremarkable appearance of the bilateral kidneys. No focal solid lesion, hydronephrosis or nephrolithiasis. Stable renal cysts. Developing atrophy of the lower pole of the  right kidney. Pelvis: Prostatomegaly with internal patchy enhancement, unchanged. Fat containing left inguinal hernia. Persistent mild thickening of the bladder wall. Bones/Soft Tissues: Interval development of L4 and L5 superior endplate compression fractures compared to 06/07/2015. Review of the MIP images confirms the above findings. IMPRESSION: CTA CHEST 1. Subtle focal sclerosis in the antral lateral aspect of the left ninth rib in the region of previously noted hypermetabolic activity. There are no definitive aggressive features, evidence of fracture or soft tissue mass. Differential considerations include very early sclerotic metastasis with minimal change compared to prior imaging, or sequelae of prior stress reaction/radiation change. Recommend continued attention on follow-up imaging. 2. Stable changes of prior left upper lobectomy and central left lower lobe radiation changes. Interval clearing of mucous plugging and resultant soft tissue thickening in the left hilar bronchial structures. CTA ABD/PELVIS 1. Continued stability of infrarenal abdominal aortic aneurysm status post EVAR complicated by a type 2 endoleak. No interval change in the excluded aneurysm sac which remains 5.3 x 5.1 cm. 2. Developing cortical atrophy of the lower pole  of the right kidney likely secondary to stent graft coverage of the accessory right lower pole renal artery. 3. Interval development of L4 on L5 superior endplate compression fractures. 4. Additional ancillary findings as above without significant interval change. Signed, Criselda Peaches, MD [facility] Electronically Signed   By: Jacqulynn Cadet M.D.   On: 12/13/2015 10:21   ASSESSMENT/PLAN: This is a very pleasant 75 year old Caucasian male recently diagnosed with metastatic non-small cell lung cancer initially diagnosed as stage IIIA (T2a., N2, M0) non-small cell lung cancer, adenocarcinoma involving  the left upper lobe and mediastinal lymphadenopathy diagnosed in August of 2014. He is status post left upper lobectomy with mediastinal lymph node dissection on 05/29/2013. He is status post 4 cycles of adjuvant chemotherapy with cisplatin and Alimta. He was also found to have metastatic brain lesion and underwent stereotactic radiotherapy in January 2017. The recent CT scan of the chest, abdomen and pelvis showed no clear evidence for disease recurrence. I discussed the scan results with the patient and his wife today. I recommended for him to continue on observation with repeat CT scan of the chest, abdomen and pelvis in 3 months for reevaluation of his disease. For the metastatic brain lesion, he will continue his routine follow-up visit and evaluation by Dr. Lisbeth Renshaw and Dr. Sherwood Gambler. He was advised to call immediately if he has any concerning symptoms in the interval. All questions were answered. The patient knows to call the clinic with any problems, questions or concerns. We can certainly see the patient much sooner if necessary.  Disclaimer: This note was dictated with voice recognition software. Similar sounding words can inadvertently be transcribed and may not be corrected upon review.

## 2015-12-17 ENCOUNTER — Other Ambulatory Visit: Payer: Self-pay | Admitting: Radiation Therapy

## 2015-12-17 DIAGNOSIS — C7931 Secondary malignant neoplasm of brain: Secondary | ICD-10-CM

## 2015-12-20 ENCOUNTER — Ambulatory Visit (INDEPENDENT_AMBULATORY_CARE_PROVIDER_SITE_OTHER): Payer: Medicare Other | Admitting: Surgery

## 2015-12-20 VITALS — BP 140/90 | HR 78 | Temp 97.0°F | Resp 16 | Wt 148.0 lb

## 2015-12-20 DIAGNOSIS — I714 Abdominal aortic aneurysm, without rupture, unspecified: Secondary | ICD-10-CM

## 2015-12-20 NOTE — Progress Notes (Signed)
Patient name: Jordan Mccullough MRN: [id number] DOB: 25-Sep-1940 Sex: male     Chief Complaint  Patient presents with  . AAA    Had CTA 12-13-15     Here for follow up.   Sees [facility] for brain cancer     HISTORY OF PRESENT ILLNESS: The patient is back for follow-up. He is status post endovascular aneurysm repair on 05/07/2014. 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  Since I last saw him, he has developed and ben treated for brain mets.  He denies abdominal pain.  Past Medical History  Diagnosis Date  . Hyperlipidemia   . Diverticulosis   . Coronary artery disease     MI 1992, S/P  PTCA; negative stress test in November 2011 with no ischemia.   . Peripheral vascular disease ([place]) 1/14    4.8x4.6 cm infrarenal abdominal aortic fusiform aneurysm, 1.5 cm right common iliac artery aneurysm  . AAA (abdominal aortic aneurysm) ([place])   . Hearing loss     Bilateral   . Myocardial infarction [facility]) 1992    Dr Angelena Form    . History of radiation therapy 11/10/13-12/12/13    lung,50Gy/30f  . Hypertension   . Shortness of breath   . Tobacco dependence     "Quit" 1992, but pt has smoked "on and off" since that time  . Asymptomatic cholelithiasis 07/2015    Incidental finding on PET CT  . [facility]    Stereotactic radiation therapy: frontoparietal 18gy,posterior frontal lobe 20gy,  . [place] 10[place]    non-small cell;  L upper lobectomy with mediastinal LN dissection, chemo, and radiation in 2014/2015. Remission until pt had questionable seizure 07/2015--MRI showed brain mets; palliative brain rad (stereotactic radiation therapy) started 08/25/15  . Malignant neoplasm of upper lobe, left bronchus or lung ([place] 10/29/2013  . Malignant neoplasm of upper lobe, left bronchus or lung ([place] 06/26/2013    Past Surgical History  Procedure Laterality Date  . Ptca  1992  . Inguinal heriiorrhaphy bilaterally    .  Colonoscopy w/ polypectomy  2003     negative 2010,due 2020; Dr BOlevia Perches . Pilonidal cyst excision    . Rotator cuff repair      Bilateral  . Cystoscopy/retrograde/ureteroscopy Bilateral 10/09/2012    Procedure: BILATERAL RETROGRADE bladder and urethral BIOPSY ;  Surgeon: DMolli Hazard MD;  [facility];  Service: Urology;  Laterality: Bilateral;  BILATERAL RETROGRADE   . Prostate biopsy N/A 10/09/2012    Procedure: PROSTATIC URETHRAL BIOPSY;  Surgeon: DMolli Hazard MD;  [facility];  Service: Urology;  Laterality: N/A;  PROSTATIC URETHRAL BIOPSY   . Hernia repair Bilateral     Inguinal  . Mediastinoscopy N/A 05/01/2013    Procedure: MEDIASTINOSCOPY;  Surgeon: BGaye Pollack MD;  [facility];  Service: Thoracic;  Laterality: N/A;  . Video bronchoscopy N/A 05/01/2013    Procedure: VIDEO BRONCHOSCOPY;  Surgeon: BGaye Pollack MD;  [facility];  Service: Thoracic;  Laterality: N/A;  . Coronary angioplasty      no stents  . Thorocotomy with lobectomy Left 05/29/2013    Procedure: LEFT THOROCOTOMY WITH LEFT UPPER LOBE LOBECTOMY;  Surgeon: BGaye Pollack MD;  [facility];  Service: Thoracic;  Laterality: Left;  . Abdominal aortic endovascular stent graft N/A 05/07/2014    Procedure: ABDOMINAL AORTIC ENDOVASCULAR STENT GRAFT;  Surgeon: VSerafina Mitchell  MD;  Location: [facility];  Service: Vascular;  Laterality: N/A;  . Eeg  08/05/15    Pt placed on keppra just prior to this test due to having ? seizure (MRI showed brain mets)  . Pfts  04/2013    Minimal obstructive airway disease  . Carotid duplex dopplers  07/2015    1-39% on R, no signif dz noted on L    Social History   Social History  . Marital Status: Married    Spouse Name: N/A  . Number of Children: N/A  . Years of Education: N/A   Occupational History  . Retired Risk analyst    Social History Main Topics  . Smoking status: Current Every Day Smoker -- 1.00 packs/day for 24 years    Types:  Cigarettes    Last Attempt to Quit: 05/28/2013  . Smokeless tobacco: Never Used     Comment: QUIT 15 YEARS AGO  . Alcohol Use: 9.0 oz/week    3 Glasses of wine, 12 Cans of beer per week     Comment: 1-2 beers a day  . Drug Use: No  . Sexual Activity: Not on file   Other Topics Concern  . Not on file   Social History Narrative   HEART HEALTHY DIET.     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   MARRIED, 2 children who live in [place].  Smoked on and off since then.   ALCOHOL USE -YES- RED WINE SOCIALLY, couple of beers at night usually.    Family History  Problem Relation Age of Onset  . Hypertension Mother   . Prostate cancer Father     Prostate cancer  . Cancer Father     Brain Tumor  . Lung cancer Sister     NON SMOKER  . Cancer Sister   . Hyperlipidemia Sister   . Diabetes Neg Hx   . Stroke Neg Hx   . Hyperlipidemia Brother   . Heart attack Brother   . Hypertension Brother     Allergies as of 12/20/2015 - Review Complete 12/20/2015  Allergen Reaction Noted  . Fluvirin [influenza vac typ a&b surf ant] Nausea And Vomiting and Other (See Comments) 08/04/2015  . Iodinated diagnostic agents Hives 07/21/2014    Current Outpatient Prescriptions on File Prior to Visit  Medication Sig Dispense Refill  . amLODipine (NORVASC) 5 MG tablet TAKE 1 TABLET (5 MG TOTAL) BY MOUTH DAILY. 90 tablet 3  . aspirin EC 81 MG tablet Take 81 mg by mouth daily.    . budesonide-formoterol (SYMBICORT) 160-4.5 MCG/ACT inhaler Inhale 1-2 puffs into the lings every 12 hours. Gargle and Spit after use. --- Please establish with new PCP for further refills 10.2 Inhaler 0  . Glucosamine-Chondroit-Vit C-Mn (GLUCOSAMINE 1500 COMPLEX PO) Take 1,500 mg by mouth daily.     Marland Kitchen levETIRAcetam (KEPPRA XR) 500 MG 24 hr tablet Take 1 tablet every night 90 tablet 3  . metoprolol (LOPRESSOR) 50 MG tablet Take 1 tablet (50 mg total) by mouth 2 (two) times daily. 180 tablet 3  . Multiple Vitamin  (MULTIVITAMIN) tablet Take 1 tablet by mouth every evening.     . pentoxifylline (TRENTAL) 400 MG CR tablet Take 1 tablet (400 mg total) by mouth 3 (three) times daily. 90 tablet 5  . rosuvastatin (CRESTOR) 40 MG tablet TAKE 1 TABLET (40 MG TOTAL) BY MOUTH DAILY. 30 tablet 6  . vitamin E 1000 UNIT capsule Take 1 capsule (1,000 Units total) by  mouth daily. 30 capsule 12   No current facility-administered medications on file prior to visit.     REVIEW OF SYSTEMS: Cardiovascular: No chest pain, chest pressure Pulmonary: No productive cough, asthma or wheezing. Neurologic: seizure in december Hematologic: No bleeding problems or clotting disorders. Musculoskeletal: No joint pain or joint swelling. Gastrointestinal: No blood in stool or hematemesis Genitourinary: No dysuria or hematuria. Psychiatric:: No history of major depression. Integumentary: No rashes or ulcers. Constitutional: No fever or chills.  PHYSICAL EXAMINATION:   Vital signs are  Filed Vitals:   12/20/15 1047  BP: 140/90  Pulse: 78  Temp: 97 F (36.1 C)  TempSrc: Oral  Resp: 16  Weight: 148 lb (67.132 kg)  SpO2: 98%   Body mass index is 25.39 kg/(m^2). General: The patient appears their stated age. HEENT:  No gross abnormalities Pulmonary:  Non labored breathing Abdomen: Soft and non-tender Musculoskeletal: There are no major deformities. Neurologic: No focal weakness or paresthesias are detected, Skin: There are no ulcer or rashes noted. Psychiatric: The patient has normal affect. Cardiovascular: There is a regular rate and rhythm without significant murmur appreciated.   Diagnostic Studies I have reviewed his CTA Which again shows a 5.3 cm infrarenal abdominal aortic aneurysm with a type II endoleak  Assessment: Status post endovascular aneurysm repair Plan: The patient's aneurysm has remained stable over many CT scans.  The endoleak persists from the anterior mesenteric artery.  I will have him follow  up in 6 months with an abdominal ultrasound.  I will also review the CT scans that Dr. Julien Nordmann may have ordered in the interim.  Eldridge Abrahams, M.D. [facility] Office: [phone] Pager:  [phone]

## 2015-12-21 ENCOUNTER — Ambulatory Visit: Payer: Medicare Other | Admitting: Physical Therapy

## 2015-12-22 ENCOUNTER — Ambulatory Visit: Payer: Medicare Other | Admitting: Physical Therapy

## 2015-12-24 ENCOUNTER — Ambulatory Visit: Payer: Medicare Other | Attending: Neurology | Admitting: Physical Therapy

## 2015-12-24 DIAGNOSIS — R2681 Unsteadiness on feet: Secondary | ICD-10-CM | POA: Diagnosis present

## 2015-12-24 DIAGNOSIS — R2689 Other abnormalities of gait and mobility: Secondary | ICD-10-CM | POA: Diagnosis not present

## 2015-12-24 DIAGNOSIS — M6281 Muscle weakness (generalized): Secondary | ICD-10-CM | POA: Insufficient documentation

## 2015-12-24 NOTE — Therapy (Signed)
[facility] [address] [place], [place], [address] Phone: [phone]   Fax:  [phone]  Physical Therapy Treatment and Discharge Summary  Patient Details  Name: Jordan Mccullough MRN: [id number] Date of Birth: 30-Dec-1940 Referring Provider: Ellouise Newer, MD  Encounter Date: 12/24/2015      PT End of Session - 12/24/15 1604    Visit Number 4   Number of Visits 5   Date for PT Re-Evaluation 12/17/15   Authorization Type UHC Medicare - GCodes required   PT Start Time 2122   PT Stop Time 1530   PT Time Calculation (min) 45 min   Activity Tolerance Patient tolerated treatment well   Behavior During Therapy [facility] for tasks assessed/performed      Past Medical History  Diagnosis Date  . Hyperlipidemia   . Diverticulosis   . Coronary artery disease     MI 1992, S/P  PTCA; negative stress test in November 2011 with no ischemia.   . Peripheral vascular disease ([place]) 1/14    4.8x4.6 cm infrarenal abdominal aortic fusiform aneurysm, 1.5 cm right common iliac artery aneurysm  . AAA (abdominal aortic aneurysm) ([place])   . Hearing loss     Bilateral   . Myocardial infarction [facility]) 1992    Dr Angelena Form    . History of radiation therapy 11/10/13-12/12/13    lung,50Gy/59f  . Hypertension   . Shortness of breath   . Tobacco dependence     "Quit" 1992, but pt has smoked "on and off" since that time  . Asymptomatic cholelithiasis 07/2015    Incidental finding on PET CT  . [facility]    Stereotactic radiation therapy: frontoparietal 18gy,posterior frontal lobe 20gy,  . Lung cancer ([place] 10[place]14    non-small cell;  L upper lobectomy with mediastinal LN dissection, chemo, and radiation in 2014/2015. Remission until pt had questionable seizure 07/2015--MRI showed brain mets; palliative brain rad (stereotactic radiation therapy) started 08/25/15  . Malignant neoplasm of upper lobe, left bronchus or lung ([place]  10/29/2013  . Malignant neoplasm of upper lobe, left bronchus or lung ([place] 06/26/2013    Past Surgical History  Procedure Laterality Date  . Ptca  1992  . Inguinal heriiorrhaphy bilaterally    . Colonoscopy w/ polypectomy  2003     negative 2010,due 2020; Dr BOlevia Perches . Pilonidal cyst excision    . Rotator cuff repair      Bilateral  . Cystoscopy/retrograde/ureteroscopy Bilateral 10/09/2012    Procedure: BILATERAL RETROGRADE bladder and urethral BIOPSY ;  Surgeon: DMolli Hazard MD;  [facility];  Service: Urology;  Laterality: Bilateral;  BILATERAL RETROGRADE   . Prostate biopsy N/A 10/09/2012    Procedure: PROSTATIC URETHRAL BIOPSY;  Surgeon: DMolli Hazard MD;  [facility];  Service: Urology;  Laterality: N/A;  PROSTATIC URETHRAL BIOPSY   . Hernia repair Bilateral     Inguinal  . Mediastinoscopy N/A 05/01/2013    Procedure: MEDIASTINOSCOPY;  Surgeon: BGaye Pollack MD;  [facility];  Service: Thoracic;  Laterality: N/A;  . Video bronchoscopy N/A 05/01/2013    Procedure: VIDEO BRONCHOSCOPY;  Surgeon: BGaye Pollack MD;  [facility];  Service: Thoracic;  Laterality: N/A;  . Coronary angioplasty      no stents  . Thorocotomy with lobectomy Left 05/29/2013    Procedure: LEFT THOROCOTOMY WITH LEFT UPPER LOBE LOBECTOMY;  Surgeon: BGaye Pollack MD;  [place]  Service: Thoracic;  Laterality: Left;  . Abdominal aortic endovascular stent graft N/A 05/07/2014    Procedure: ABDOMINAL AORTIC ENDOVASCULAR STENT GRAFT;  Surgeon: Serafina Mitchell, MD;  [facility];  Service: Vascular;  Laterality: N/A;  . Eeg  08/05/15    Pt placed on keppra just prior to this test due to having ? seizure (MRI showed brain mets)  . Pfts  04/2013    Minimal obstructive airway disease  . Carotid duplex dopplers  07/2015    1-39% on R, no signif dz noted on L    There were no vitals filed for this visit.      Subjective Assessment - 12/24/15 1447    Subjective PT  denies pain and reports no falls. Reports ongoing perception of being "weak" but doesn't feel as though he needs L Foot Up brcae at this time due to improved control of L  ankle.    Pertinent History PMH significant for: lung cancer s/p lobectomy, chemotherapy and radiation, hyperlipidemia, CAD s/p MI, peripheral vascular disease, abdominal aortic aneurysm   Patient Stated Goals "I want to get stronger in my legs."   Currently in Pain? No/denies            [facility] PT Assessment - 12/24/15 0001    Assessment   Medical Diagnosis leg weakness   Referring Provider Ellouise Newer, MD   Onset Date/Surgical Date 10/27/15   Prior Function   Level of Independence Independent   6 Minute Walk- Baseline   6 Minute Walk- Baseline yes   BP (mmHg) 140/88 mmHg   HR (bpm) 82   02 Sat (%RA) 96 %   6 Minute walk- Post Test   6 Minute Walk Post Test yes   BP (mmHg) (!) 162/92 mmHg   HR (bpm) 95   02 Sat (%RA) 96 %                     [facility] Adult PT Treatment/Exercise - 12/24/15 0001    Ambulation/Gait   Ambulation/Gait Yes   Ambulation/Gait Assistance 6: Modified independent (Device/Increase time)   Ambulation/Gait Assistance Details Noted onset of intermittent L foot drag beginning after approx. 530' during 6MWT (performed over unlevel, paved surfaces, as on evaluation).   Ambulation Distance (Feet) 1367 Feet  on 6MWT   Assistive device None   Gait Pattern Step-through pattern;Decreased hip/knee flexion - left;Decreased arm swing - left   Ambulation Surface Unlevel;Outdoor;Paved   Neuro Re-ed    Neuro Re-ed Details  Reviewed LE braiding 2 x10' in B directions for LE coordination. Progressed from BUE support to single UE support at countertop. Explained and demonstrated finger tip support for increased difficulty. Provided cueing for forward gaze.  See Balance Exercises tab for further details.   Exercises   Exercises Other Exercises   Other Exercises  Reviewed quadruped alternating  contralateral UE/LE elevation, progressing from 5 reps to 15 reps. Explained and demonstrated progression to ipsilateral UE/LE elevation for increased challenge.             Balance Exercises - 12/24/15 1555    Balance Exercises: Standing   Standing Eyes Closed Narrow base of support (BOS);Head turns;Foam/compliant surface;Other reps (comment)  2 pillows; horiz, vert, diagonal head turns x10           PT Education - 12/24/15 1605    Education provided Yes   Education Details PT goals, findings, progress, and POC.  Progressed current HEP and explained/demonstrated how to further progress.   Person(s) Educated  Patient   Methods Explanation;Demonstration;Handout;Verbal cues   Comprehension Verbalized understanding;Returned demonstration          PT Short Term Goals - 12/01/15 1531    PT SHORT TERM GOAL #1   Title STG's = LTG's            PT Long Term Goals - 01/16/2016 1506    PT LONG TERM GOAL #1   Title Pt will independently perform HEP to maximize functional gains made in PT.   (Target date: 12/15/15)   Baseline Met 4/12.   Status Achieved   PT LONG TERM GOAL #2   Title Pt will improve 6MWT distance from 1,404' to 1,517' to indicate significant improvement in functional endurance.   Baseline 5/5: 6MWT distance = 1,367', which may be attributable to pt self-selected gait speed decreased to improve quality of gait.   Status Not Met   PT LONG TERM GOAL #3   Title Pt will independently ambulate 500' over unlevel, paved surfaces with no occurrence left foot drag to indicate progress toward PLOF.   Baseline Met 5/5, as pt ambulated > 1,300', with onset of intermittent L toe drag after ambulating approx. 530'.   Status Achieved   PT LONG TERM GOAL #4   Title Pt will perform condition 4 of Modified CTSIB for > / = 30 seconds without LOB to indicate improved vestibular reliance.     Baseline Met 5/5.   Status Achieved               Plan - 2016/01/16 1616     Clinical Impression Statement Pt has met 3 of 4  LTG's. LTG for 6MWT not met; however, quality of gait much improved during today's 6MWT as compared with PT evaluation. Pt will be DC'ed from outpatient PT at this time. Pt in full agreement with DC plan.    Consulted and Agree with Plan of Care Patient      Patient will benefit from skilled therapeutic intervention in order to improve the following deficits and impairments:     Visit Diagnosis: Other abnormalities of gait and mobility - Plan: PT plan of care cert/re-cert  Unsteadiness on feet - Plan: PT plan of care cert/re-cert  Muscle weakness (generalized) - Plan: PT plan of care cert/re-cert       G-Codes - 2016/01/16 1606    Functional Assessment Tool Used 6MWT distance = 1,367'   (1729' is normative value for patient's age)   Functional Limitation Mobility: Walking and moving around   Mobility: Walking and Moving Around Goal Status [phone]) At least 1 percent but less than 20 percent impaired, limited or restricted   Mobility: Walking and Moving Around Discharge Status [phone]) At least 20 percent but less than 40 percent impaired, limited or restricted      Problem List Patient Active Problem List   Diagnosis Date Noted  . Complaints of leg weakness 11/08/2015  . 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 seizures, not intractable, without status epilepticus ([place]) 10/11/2015  . Syncope 08/05/2015  . Seizure-like activity ([place]) 08/04/2015  . Brain metastases ([place]) 08/04/2015  . Seizure ([place]) 08/04/2015  . AAA (abdominal aortic aneurysm) without rupture ([place]) 06/21/2015  . Aftercare following surgery of the circulatory system 06/21/2015  . Essential hypertension 06/23/2014  . Lung cancer ([place]) 06/19/2013  . History of AAA (abdominal aortic aneurysm) repair 10/14/2012  . Aneurysm of common iliac artery ([place]) 10/14/2012  . Elevated PSA 07/10/2012  . CAD (coronary artery disease)  07/31/2011  . DIVERTICULOSIS, COLON 07/05/2010  . COLONIC POLYPS 02/24/2010  . AMBLYOPIA, HX OF 02/24/2010  . HYPERGLYCEMIA, FASTING 05/15/2008  . Hyperlipidemia 05/23/2007  . MYOCARDIAL INFARCTION, HX OF 05/23/2007   PHYSICAL THERAPY DISCHARGE SUMMARY  Visits from Start of Care: 4  Current functional level related to goals / functional outcomes: See above goals and goal statuses/outcomes.   Remaining deficits: Pt continues to demo intermittent L forefoot drag over unlevel, outdoor surfaces, especially with fatigue. However, this has improved markedly, pt owns personal L Foot Up brace to address this impairments, and pt is proficient with HEP to address selective control of L ankle DF.   Education / Equipment: HEP and progression; information on L Foot Up brace; walking program Plan: Patient agrees to discharge.  Patient goals were met. Patient is being discharged due to meeting the stated rehab goals.  ?????        Name: ABDOULAYE DRUM MRN: [id number] Date of Birth: Aug 24, 1940   Billie Ruddy, PT, [facility] [address] [place], [place], [address] Phone: [phone]   Fax:  [phone] 12/24/2015, 4:29 PM

## 2015-12-24 NOTE — Patient Instructions (Addendum)
Feet Together (Compliant Surface) Head Motion - Eyes Closed    Stand with your back to a corner with a stable chair in front of you. Stand on 2 pillows with feet 2-3 inches away from eachother. Close eyes and move head slowly: up and down 5 times; right to left 5 times.Repeat __2-3__ times per day; diagonally up/right to down left; and diagonally up/left to down/right. Progress by bringing feet closer together. All-Fours)    Raise right arm and opposite leg. Hold for 2-3 seconds, then return to resting position. Repeat, but raise left arm and opposite leg.  Perform __15__ times per set. Do __2-3__ sets per day.   Lower Extremity Braiding   Start By facing your kitchen island; place both hands on [place].  Move to side: 1) cross right leg in front of left, 2) bring back leg out to side, then 3) cross right leg behind left, 4) bring left leg out to side. Continue sequence in same direction. Reverse sequence, moving in opposite direction.   Repeat sequence __2-3__ times around your island with right leg leading, __2-3__ times around your island with left leg leading.  Progress by decreasing the amount of support with your hands. Also, try to look at your feet less.

## 2016-01-03 ENCOUNTER — Ambulatory Visit (INDEPENDENT_AMBULATORY_CARE_PROVIDER_SITE_OTHER): Payer: Medicare Other | Admitting: Family Medicine

## 2016-01-03 ENCOUNTER — Encounter: Payer: Self-pay | Admitting: Family Medicine

## 2016-01-03 VITALS — BP 134/88 | HR 80 | Temp 97.8°F | Resp 16 | Ht 64.0 in | Wt 149.5 lb

## 2016-01-03 DIAGNOSIS — I10 Essential (primary) hypertension: Secondary | ICD-10-CM

## 2016-01-03 DIAGNOSIS — M791 Myalgia, unspecified site: Secondary | ICD-10-CM

## 2016-01-03 DIAGNOSIS — R5382 Chronic fatigue, unspecified: Secondary | ICD-10-CM

## 2016-01-03 DIAGNOSIS — E785 Hyperlipidemia, unspecified: Secondary | ICD-10-CM | POA: Diagnosis not present

## 2016-01-03 DIAGNOSIS — C7931 Secondary malignant neoplasm of brain: Secondary | ICD-10-CM | POA: Diagnosis not present

## 2016-01-03 LAB — CK: Total CK: 78 U/L (ref 7–232)

## 2016-01-03 NOTE — Progress Notes (Signed)
Pre visit review using our clinic review tool, if applicable. No additional management support is needed unless otherwise documented below in the visit note. 

## 2016-01-03 NOTE — Progress Notes (Signed)
OFFICE VISIT  01/03/2016   CC:  Chief Complaint  Patient presents with  . Follow-up    Pt is not fasting.    HPI:    Patient is a 75 y.o. Caucasian male who presents for 3 mo f/u HTN, HLD, and non small cell lung ca with recently detected met to the brain 07/2015. Recent brain scan: no change in size of lesion but he had a small amount of edema around it--was started on Trental for this swelling: he has had one stereotactic radiation treatment 08/25/15.  He has had no seizure activity since the one in Dec 2016, after which he got on keppra.  Recent f/u with oncologist, Dr. Earlie Server, showed a CT C/A/P w/out sign of metastatic dz or recurrence. 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  He has had a problem with chronic bilat leg weakness and this got a little better with change to extended release keppra. However, his neurologist also set him up for PT.  Additionally, he was recently started on Trental for intermittent claudication.  This complains of feeling tired/weak/drowsy all the time.  Some muscle aches in forearms.  No tingling or numbness in his extremities. Says he has been on his crestor since 1992.  He can do a fast walk on treadmill for 10-20 min w/out problem. This complaint of feeling tired, esp in legs, has been longstanding.  He says he is not a snorer/no witnessed apneic events in sleep.   BP: compliant with amlodipine and lopressor. Still smoking a pack per day of cigarettes.    Past Medical History  Diagnosis Date  . Hyperlipidemia   . Diverticulosis   . Coronary artery disease     MI 1992, S/P  PTCA; negative stress test in November 2011 with no ischemia.   . Peripheral vascular disease ([place]) 1/14    4.8x4.6 cm infrarenal abdominal aortic fusiform aneurysm, 1.5 cm right common iliac artery aneurysm  . AAA (abdominal aortic aneurysm) ([place])   . Hearing loss      Bilateral   . Myocardial infarction [facility]) 1992    Dr Angelena Form    . History of radiation therapy 11/10/13-12/12/13    lung,50Gy/64f  . Hypertension   . Shortness of breath   . Tobacco dependence     "Quit" 1992, but pt has smoked "on and off" since that time  . Asymptomatic cholelithiasis 07/2015    Incidental finding on PET CT  . [facility]    Stereotactic radiation therapy: frontoparietal 18gy,posterior frontal lobe 20gy,  . Lung cancer [place] 10[place]    non-small cell;  L upper lobectomy with mediastinal LN dissection, chemo, and radiation in 2014/2015. Remission until pt had questionable seizure 07/2015--MRI showed brain mets; palliative brain rad (stereotactic radiation therapy) started 08/25/15  . Malignant neoplasm of upper lobe, left bronchus or lung ([place] 10/29/2013  . Malignant [place] of [place], [place]us or lung [place] 06/26[place]2013    Past Surgical History  Procedure Laterality Date  . Ptca  1992  . Inguinal heriiorrhaphy bilaterally    . Colonoscopy w/ polypectomy  2003     negative 2010,due 2020; Dr BOlevia Perches . Pilonidal cyst excision    . Rotator cuff repair      Bilateral  . Cystoscopy/retrograde/ureteroscopy Bilateral 10/09/2012    Procedure: BILATERAL RETROGRADE bladder and urethral BIOPSY ;  Surgeon: DMolli Hazard MD;  Location:  [facility] ORS;  Service: Urology;  Laterality: Bilateral;  BILATERAL RETROGRADE   . Prostate biopsy N/A 10/09/2012    Procedure: PROSTATIC URETHRAL BIOPSY;  Surgeon: Molli Hazard, MD;  Location: [facility]S;  Service: Urology;  Laterality: N/A;  PROSTATIC URETHRAL BIOPSY   . Hernia repair Bilateral     Inguinal  . Mediastinoscopy N/A 05/01/2013    Procedure: MEDIASTINOSCOPY;  Surgeon: Gaye Pollack, MD;  Location: [facility] OR;  Service: Thoracic;  Laterality: N/A;  . Video bronchoscopy N/A 05/01/2013    Procedure: VIDEO BRONCHOSCOPY;  Surgeon: Gaye Pollack, MD;  Location: [facility] OR;  Service: Thoracic;  Laterality: N/A;   . Coronary angioplasty      no stents  . Thorocotomy with lobectomy Left 05/29/2013    Procedure: LEFT THOROCOTOMY WITH LEFT UPPER LOBE LOBECTOMY;  Surgeon: Gaye Pollack, MD;  Location: [facility] OR;  Service: Thoracic;  Laterality: Left;  . Abdominal aortic endovascular stent graft N/A 05/07/2014    Procedure: ABDOMINAL AORTIC ENDOVASCULAR STENT GRAFT;  Surgeon: Serafina Mitchell, MD;  Location[facility] OR;  Service: Vascular;  Laterality: N/A;  . Eeg  08/05/15    Pt placed on keppra just prior to this test due to having ? seizure (MRI showed brain mets)  . Pfts  04/2013    Minimal obstructive airway disease  . Carotid duplex dopplers  07/2015    1-39% on R, no signif dz noted on L    Outpatient Prescriptions Prior to Visit  Medication Sig Dispense Refill  . amLODipine (NORVASC) 5 MG tablet TAKE 1 TABLET (5 MG TOTAL) BY MOUTH DAILY. 90 tablet 3  . aspirin EC 81 MG tablet Take 81 mg by mouth daily.    . budesonide-formoterol (SYMBICORT) 160-4.5 MCG/ACT inhaler Inhale 1-2 puffs into the lings every 12 hours. Gargle and Spit after use. --- Please establish with new PCP for further refills 10.2 Inhaler 0  . Glucosamine-Chondroit-Vit C-Mn (GLUCOSAMINE 1500 COMPLEX PO) Take 1,500 mg by mouth daily.     Marland Kitchen levETIRAcetam (KEPPRA XR) 500 MG 24 hr tablet Take 1 tablet every night 90 tablet 3  . metoprolol (LOPRESSOR) 50 MG tablet Take 1 tablet (50 mg total) by mouth 2 (two) times daily. 180 tablet 3  . Multiple Vitamin (MULTIVITAMIN) tablet Take 1 tablet by mouth every evening.     . pentoxifylline (TRENTAL) 400 MG CR tablet Take 1 tablet (400 mg total) by mouth 3 (three) times daily. 90 tablet 5  . rosuvastatin (CRESTOR) 40 MG tablet TAKE 1 TABLET (40 MG TOTAL) BY MOUTH DAILY. 30 tablet 6  . vitamin E 1000 UNIT capsule Take 1 capsule (1,000 Units total) by mouth daily. 30 capsule 12   No facility-administered medications prior to visit.    Allergies  Allergen Reactions  . Fluvirin [Influenza Vac Typ A&B  Surf Ant] Nausea And Vomiting and Other (See Comments)    Seizure like activity?  Marland Kitchen Iodinated Diagnostic Agents Hives    1 hive on lt cheek lasting approximately 1 hour on last 2 CT per pt; needs pre meds in future; 50 mg benadryl po 1 hr prior to exam per Dr. Weber Cooks    ROS As per HPI  PE: Blood pressure 134/88, pulse 80, temperature 97.8 F (36.6 C), temperature source Oral, resp. rate 16, height 5' 4"  (1.626 m), weight 149 lb 8 oz (67.813 kg), SpO2 96 %. Gen: Alert, well appearing.  Patient is oriented to person, place, time, and situation. AFFECT: pleasant, lucid thought and speech.  XOV:ANVB: no injection, icteris, swelling, or exudate.  EOMI, PERRLA. Mouth: lips without lesion/swelling.  Oral mucosa pink and moist. Oropharynx without erythema, exudate, or swelling.  CV: RRR, no m/r/g.   LUNGS: CTA bilat except left upper 1/2 of posterior lung field has some slightly crunchy breath sounds and decreased aeration, nonlabored resps EXT: no clubbing, cyanosis, or edema.  NEURO: mild left arm and leg weakness (4/5) prox> dist.  No tremor.  No pronator drift.  Holds left arm a bit lower than right when performing pronator drift test.  LABS:  Lab Results  Component Value Date   WBC 7.7 12/16/2015   HGB 14.3 12/16/2015   HCT 42.9 12/16/2015   MCV 87.4 12/16/2015   PLT 220 12/16/2015     Chemistry      Component Value Date/Time   NA 141 12/16/2015 0803   NA 140 09/16/2015 0927   K 4.3 12/16/2015 0803   K 4.9 09/16/2015 0927   CL 101 09/16/2015 0927   CO2 26 12/16/2015 0803   CO2 33* 09/16/2015 0927   BUN 9.4 12/16/2015 0803   BUN 30* 09/16/2015 0927   CREATININE 0.8 12/16/2015 0803   CREATININE 0.83 12/06/2015 1458   CREATININE 0.71 09/16/2015 0927      Component Value Date/Time   CALCIUM 9.7 12/16/2015 0803   CALCIUM 9.1 09/16/2015 0927   ALKPHOS 53 12/16/2015 0803   ALKPHOS 48 09/16/2015 0927   AST 17 12/16/2015 0803   AST 21 09/16/2015 0927   ALT 19 12/16/2015 0803    ALT 49 09/16/2015 0927   BILITOT 0.36 12/16/2015 0803   BILITOT 0.4 09/16/2015 0927     Lab Results  Component Value Date   TSH 0.708 08/05/2015   Lab Results  Component Value Date   CHOL 137 08/13/2013   HDL 38.10* 08/13/2013   LDLCALC 83 08/13/2013   TRIG 81.0 08/13/2013   CHOLHDL 4 08/13/2013   IMPRESSION AND PLAN:  1) HTN; The current medical regimen is effective;  continue present plan and medications. Lytes/cr good 12/16/15.  2) HLD: has tolerated statin x decades.  Most recent lipid panel is from 2014.  He is not fasting today. See #3 below.  3) Chronic fatigue, esp legs: suspect multifactorial (deconditioning, ?PVD, ?med effect). Even though he has been on crestor forever, will take him off of this med for 2 weeks and see if his sx's resolve. Will also check CK total today.  4) Tobacco dependence: encouraged complete cessation today but pt not really contemplating a cessation trial at this time.  5) Metastatic lung cancer (to brain): seizure d/o secondary to this. Doing well on keppra XR 500 mg qhs.  Continue appropriate f/u with neurologist. He'll be getting another brain MRI at the end of this month per his report. He is being well cared for by his specialists.  An After Visit Summary was printed and given to the patient.  FOLLOW UP: Return in about 3 months (around 04/04/2016) for routine chronic illness f/u (30 min).  Signed:  Crissie Sickles, MD           01/03/2016

## 2016-01-18 ENCOUNTER — Ambulatory Visit
Admission: RE | Admit: 2016-01-18 | Discharge: 2016-01-18 | Disposition: A | Discharge status: Home / Self Care | Payer: Medicare Other | Source: Ambulatory Visit | Attending: Radiation Oncology | Admitting: Radiation Oncology

## 2016-01-18 DIAGNOSIS — C7931 Secondary malignant neoplasm of brain: Secondary | ICD-10-CM

## 2016-01-18 MED ORDER — GADOBENATE DIMEGLUMINE 529 MG/ML IV SOLN
14.0000 mL | Freq: Once | INTRAVENOUS | Status: AC | PRN
  Administered 2016-01-18: 14 mL via INTRAVENOUS

## 2016-01-19 ENCOUNTER — Encounter: Payer: Self-pay | Admitting: Radiation Oncology

## 2016-01-19 ENCOUNTER — Ambulatory Visit
Admission: RE | Admit: 2016-01-19 | Discharge: 2016-01-19 | Disposition: A | Discharge status: Home / Self Care | Payer: Medicare Other | Source: Ambulatory Visit | Attending: Radiation Oncology | Admitting: Radiation Oncology

## 2016-01-19 VITALS — BP 148/90 | HR 80 | Temp 97.8°F | Resp 20 | Ht 64.0 in | Wt 148.3 lb

## 2016-01-19 DIAGNOSIS — C7931 Secondary malignant neoplasm of brain: Secondary | ICD-10-CM | POA: Diagnosis present

## 2016-01-19 DIAGNOSIS — R531 Weakness: Secondary | ICD-10-CM | POA: Diagnosis not present

## 2016-01-19 DIAGNOSIS — C3490 Malignant neoplasm of unspecified part of unspecified bronchus or lung: Secondary | ICD-10-CM | POA: Insufficient documentation

## 2016-01-19 NOTE — Progress Notes (Signed)
Follow up s/p SRS Brain 08/25/15,  01/18/16 MRI Brain results in, patient here to discuss, only c/o weakness in his shoulders and muscles, appetite good, started Trental and vitamin E, also takes Keppra, no nausea, no vision changes or head aches 2:56 PM BP 148/90 mmHg  Pulse 80  Temp(Src) 97.8 F (36.6 C) (Oral)  Resp 20  Ht '5\' 4"'$  (1.626 m)  Wt 148 lb 4.8 oz (67.268 kg)  BMI 25.44 kg/m2  SpO2 100%  Wt Readings from Last 3 Encounters:  01/19/16 148 lb 4.8 oz (67.268 kg)  01/18/16 149 lb (67.586 kg)  01/03/16 149 lb 8 oz (67.813 kg)

## 2016-01-19 NOTE — Progress Notes (Signed)
Radiation Oncology         ([phone]) [phone] ________________________________  Name: Jordan Mccullough MRN: [id number]  Date: 01/19/2016  DOB: 27-Mar-1941  Follow-Up Visit Note  CC: Tammi Sou, MD  Gaye Pollack, MD  Diagnosis:   Metastatic Lung Cancer  Indication for treatment:  palliative       Radiation treatment dates:   08/25/15  Site/dose:    1.  PTV1 Rt Vertex Frontoparietal 66m target was treated using 4 Arcs to a prescription dose of 18 Gy. ExacTrac Snap verification was performed for each couch angle.  2.  PTV2  Rt Posterior Frontal Lobe 150mtarget was treated using 4 Arcs to a prescription dose of 20 Gy. ExacTrac Snap verification was performed for each couch angle.   Narrative:   Follow up s/p SRS Brain 08/25/15.  MRI Brain results from 01/18/16 are in, patient here to discuss. Only c/o left-sided weakness in his shoulders and muscles, appetite good, started Trental and vitamin E, also takes Keppra, no nausea, no vision changes or headaches. Currently not undergoing chemotherapy.   Brain MRI on 01/18/16 indicates further mild enlargement of both treated rim enhancing lesions in the right hemisphere with stable to slightly increased surrounding vasogenic edema and mild regional mass effect. This is most likely due to treatment effect based on imaging alone, but correlation with clinical symptoms is suggested.                               ALLERGIES:  is allergic to fluvirin and iodinated diagnostic agents.  Meds: Current Outpatient Prescriptions  Medication Sig Dispense Refill  . amLODipine (NORVASC) 5 MG tablet TAKE 1 TABLET (5 MG TOTAL) BY MOUTH DAILY. 90 tablet 3  . aspirin EC 81 MG tablet Take 81 mg by mouth daily.    . budesonide-formoterol (SYMBICORT) 160-4.5 MCG/ACT inhaler Inhale 1-2 puffs into the lings every 12 hours. Gargle and Spit after use. --- Please establish with new PCP for further refills 10.2 Inhaler 0  . Glucosamine-Chondroit-Vit C-Mn (GLUCOSAMINE 1500  COMPLEX PO) Take 1,500 mg by mouth daily.     . Marland KitchenevETIRAcetam (KEPPRA XR) 500 MG 24 hr tablet Take 1 tablet every night 90 tablet 3  . metoprolol (LOPRESSOR) 50 MG tablet Take 1 tablet (50 mg total) by mouth 2 (two) times daily. 180 tablet 3  . Multiple Vitamin (MULTIVITAMIN) tablet Take 1 tablet by mouth every evening.     . pentoxifylline (TRENTAL) 400 MG CR tablet Take 1 tablet (400 mg total) by mouth 3 (three) times daily. 90 tablet 5  . rosuvastatin (CRESTOR) 40 MG tablet TAKE 1 TABLET (40 MG TOTAL) BY MOUTH DAILY. 30 tablet 6  . vitamin E 1000 UNIT capsule Take 1 capsule (1,000 Units total) by mouth daily. 30 capsule 12   No current facility-administered medications for this encounter.    Physical Findings: The patient is in no acute distress. Patient is alert and oriented.  height is '5\' 4"'$  (1.626 m) and weight is 148 lb 4.8 oz (67.268 kg). His oral temperature is 97.8 F (36.6 C). His blood pressure is 148/90 and his pulse is 80. His respiration is 20 and oxygen saturation is 100%. .    4/5 proximal strength in the left UE and LE. Grip strength 5/5.  Lab Findings: Lab Results  Component Value Date   WBC 7.7 12/16/2015   HGB 14.3 12/16/2015   HCT 42.9 12/16/2015   MCV  87.4 12/16/2015   PLT 220 12/16/2015     Radiographic Findings: Mr Jeri Cos IO Contrast  01/18/2016  CLINICAL DATA:  75 year old malewith non-small cell lung cancer adenocarcinoma, metastatic to brain status post stereotactic radiosurgery in January 2017: - Rt Vertex Frontoparietal 75m target was treated using 4 Arcs to a prescription dose of 18 Gy - Rt Posterior Frontal Lobe 129mtarget was treated using 4 Arcs to a prescription dose of 20 Gy. Subsequent encounter. EXAM: MRI HEAD WITHOUT AND WITH CONTRAST TECHNIQUE: Multiplanar, multiecho pulse sequences of the brain and surrounding structures were obtained without and with intravenous contrast. CONTRAST:  1415mULTIHANCE GADOBENATE DIMEGLUMINE 529 MG/ML IV SOLN  COMPARISON:  12/03/2015 and earlier. FINDINGS: Both of the right hemisphere treated lesions continue to mildly enlarge. The right lateral sensory strip rim enhancing lesion now measures 12 x 14 mm (versus 11 x 13 mm previously) where as the superior right motor strip rim enhancing lesion measures 20 x 24 mm (versus 19 x 23 mm previously). Continued confluent and extensive surrounding T2 and FLAIR hyperintensity with mild mass effect suggesting vasogenic edema. The anterior and inferior extent of the edema is stable whereas the posterior extent in the right parietal lobe has mildly progressed (as seen on series 7, image 19 compared to the same series in image previously). Mass effect on the posterior right lateral ventricle is stable. No ventriculomegaly. The more lateral right treated lesion now demonstrates micro hemorrhage or hemosiderin deposition which is new (series 6, image 18). Faint mineralization or hemosiderin along the inferior aspect of the more superior lesion also is new (series 6, image 22). No new brain metastasis or other abnormal intracranial enhancement identified. Unchanged chronic micro hemorrhage in the superior right occipital lobe on series 6, image 14. Major intracranial vascular flow voids are stable. Stable gray and white matter signal elsewhere. No restricted diffusion or evidence of acute infarction. No extra-axial hemorrhage or collection. No dural thickening identified. Negative pituitary, cervicomedullary junction and visualized cervical spinal cord. Visible bone marrow signal remains normal. Visible internal auditory structures appear normal. Mastoids and paranasal sinuses are stable. Small volume retained secretions in the posterior right nasal cavity and nasopharynx re- demonstrated. Negative orbit and scalp soft tissues. IMPRESSION: 1. Further mild enlargement of both treated rim enhancing lesions in the right hemisphere with stable to slightly increased surrounding vasogenic  edema and mild regional mass effect. I continue to favor treatment effect/pseudo-progression over true progression of disease. Query increasing clinical symptoms. 2. No new brain metastasis identified. Electronically Signed   By: H  Genevie AnnD.   On: 01/18/2016 16:23    Impression: The patient has been diagnosed with metastatic lung cancer. His recent brain MRI shows evidence in favor of treatment effect, with no new indication of brain metastasis. There is some concern regarding his weakness, he will visit Dr. NudSherwood Gamblerr further consideration. I discussed with the patient about going on steroids to alleviate some of the swelling and to confirm treatment effect. I have also discussed this with Dr. NudSherwood Gamblerhis would be reasonable in my opinion although if the patient is stable clinically then continued observation would also be an option. This will be decided tomorrow.  Plan: Sees Dr.Nudelman 01/20/16. I will refer to Dr.Nudelman after his visit with the patient to see if steroids would be appropriate. The patient will continue trental medication/vitamin E.   ------------------------------------------------  JohJodelle GrossD, PhD  This document serves as a record of services personally performed by JohKyung Rudd  MD. It was created on his behalf by Derek Mound, a trained medical scribe. The creation of this record is based on the scribe's personal observations and the provider's statements to them. This document has been checked and approved by the attending provider.

## 2016-01-20 ENCOUNTER — Encounter: Payer: Self-pay | Admitting: Radiation Therapy

## 2016-01-20 NOTE — Progress Notes (Signed)
Jordan Mccullough was seen by Dr. Sherwood Gambler today, 6/1. Dr. Sherwood Gambler does not feel that his weakness has iworsened since his visit 6 weeks ago. The patient would prefer to not have to take steroids and is not interested in surgical intervention either if it can be avoided.  The plan is to not add any steroids at this time. Dr. Sherwood Gambler will see [place] again in 6 weeks without a scan and has requested that we do an MRI in our standard 3 mo interval.    Mont Dutton R.T.(R)(T) Special Procedures Navigator  [phone]

## 2016-01-25 NOTE — Addendum Note (Signed)
Addended by: Thresa Ross C on: 01/25/2016 12:57 PM   Modules accepted: Orders

## 2016-02-01 ENCOUNTER — Encounter: Payer: Self-pay | Admitting: Cardiology

## 2016-02-19 ENCOUNTER — Telehealth: Payer: Self-pay

## 2016-02-19 NOTE — Telephone Encounter (Signed)
Patient is on the list for [employer] 2017 and may be a good candidate for an AWV in 2017. Please let me know if/when appt is scheduled or if it can be done at the time of the next visit.   Note: Pt has an appt on 04/03/16 that I have changed to reflect a 30 min appt vs a 15 min (May 2017 AVS noted the appt length).

## 2016-02-23 NOTE — Telephone Encounter (Signed)
He comes in routinely so I changed the visit to his AWV.

## 2016-02-23 NOTE — Telephone Encounter (Signed)
Thank you :)

## 2016-02-28 ENCOUNTER — Encounter: Payer: Self-pay | Admitting: Family Medicine

## 2016-02-29 ENCOUNTER — Encounter: Payer: Self-pay | Admitting: Internal Medicine

## 2016-02-29 MED ORDER — BUDESONIDE-FORMOTEROL FUMARATE 160-4.5 MCG/ACT IN AERO: INHALATION_SPRAY | RESPIRATORY_TRACT | Status: DC

## 2016-02-29 NOTE — Telephone Encounter (Signed)
RF request for symbicort LOV: 01/03/16 Next ov: 04/03/16 Last written: 08/30/15 #10.2 w/ 0RF

## 2016-03-01 ENCOUNTER — Other Ambulatory Visit: Payer: Self-pay | Admitting: Medical Oncology

## 2016-03-01 ENCOUNTER — Telehealth: Payer: Self-pay | Admitting: Nurse Practitioner

## 2016-03-01 NOTE — Telephone Encounter (Signed)
Mailed patient letter from [employer].   PI

## 2016-03-06 ENCOUNTER — Other Ambulatory Visit: Payer: Self-pay | Admitting: Radiation Therapy

## 2016-03-06 DIAGNOSIS — C7949 Secondary malignant neoplasm of other parts of nervous system: Secondary | ICD-10-CM

## 2016-03-06 DIAGNOSIS — C7931 Secondary malignant neoplasm of brain: Secondary | ICD-10-CM

## 2016-03-08 ENCOUNTER — Other Ambulatory Visit: Payer: Medicare Other

## 2016-03-09 ENCOUNTER — Ambulatory Visit (HOSPITAL_COMMUNITY): Payer: Medicare Other

## 2016-03-09 ENCOUNTER — Other Ambulatory Visit: Payer: Self-pay | Admitting: *Deleted

## 2016-03-09 ENCOUNTER — Telehealth: Payer: Self-pay | Admitting: Neurology

## 2016-03-09 ENCOUNTER — Other Ambulatory Visit: Payer: Self-pay | Admitting: Medical Oncology

## 2016-03-09 ENCOUNTER — Other Ambulatory Visit: Payer: Medicare Other

## 2016-03-09 DIAGNOSIS — G40209 Localization-related (focal) (partial) symptomatic epilepsy and epileptic syndromes with complex partial seizures, not intractable, without status epilepticus: Secondary | ICD-10-CM

## 2016-03-09 MED ORDER — LEVETIRACETAM ER 500 MG PO TB24: ORAL_TABLET | ORAL | Status: DC

## 2016-03-09 NOTE — Telephone Encounter (Signed)
Please advise 

## 2016-03-09 NOTE — Telephone Encounter (Signed)
These "facial seizures" seem new, they did not report on his last visit. Please ask about them and document frequency, then instruct to increase Keppra XR '500mg'$  to 2 tablets qhs and have him f/u in 2 weeks or so. Thanks

## 2016-03-09 NOTE — Telephone Encounter (Signed)
Rx sent 

## 2016-03-09 NOTE — Telephone Encounter (Signed)
Jordan Mccullough 09/13/1940.  [phone]. His wife Gwenette Greet  called wanting to know if the Keppra can be increased. May need a refill if so.  He has facial seizures. He is a patient of Dr. Delice Lesch. Thank you

## 2016-03-09 NOTE — Telephone Encounter (Signed)
I spoke with patient's wife and she said that the facial seizures started the week of July 4th.  He had 5 in one day then they stopped.  He had 4 yesterday and 1 this morning.  He will follow up with you on August 1 at 3:30.

## 2016-03-09 NOTE — Telephone Encounter (Signed)
His wife called back and would like to get a refill for this medication. He uses [facility] in [place]. Thank you

## 2016-03-10 ENCOUNTER — Telehealth: Payer: Self-pay | Admitting: *Deleted

## 2016-03-10 NOTE — Telephone Encounter (Signed)
Called pt lmovm with home #, spoke with wife on cell# regarding message from after hours triage to cancel CT scan appt. Gave Wife [employer]. # advised after she has rescheduled the CT Scan a new lab and MD appt will be made. Pt to call back with new CT scan appt date.

## 2016-03-12 ENCOUNTER — Encounter: Payer: Self-pay | Admitting: Internal Medicine

## 2016-03-13 ENCOUNTER — Ambulatory Visit
Admission: RE | Admit: 2016-03-13 | Discharge: 2016-03-13 | Disposition: A | Discharge status: Home / Self Care | Payer: Medicare Other | Source: Ambulatory Visit | Attending: Radiation Oncology | Admitting: Radiation Oncology

## 2016-03-13 ENCOUNTER — Telehealth: Payer: Self-pay | Admitting: Internal Medicine

## 2016-03-13 DIAGNOSIS — Z515 Encounter for palliative care: Secondary | ICD-10-CM | POA: Diagnosis not present

## 2016-03-13 DIAGNOSIS — Z66 Do not resuscitate: Secondary | ICD-10-CM | POA: Diagnosis not present

## 2016-03-13 NOTE — Telephone Encounter (Signed)
pt called to sched lab before CT on 7.25

## 2016-03-13 NOTE — Consult Note (Signed)
Consultation Note Date: 03/13/2016   Patient Name: Jordan Mccullough  DOB: [birth year]  MRN: [id number]  Age / Sex: 75 y.o., male  PCP: Tammi Sou, MD Referring Physician: Kyung Rudd, MD  Reason for Consultation: Establishing goals of care and Psychosocial/spiritual support  HPI/Patient Profile: 75 y.o. male   admitted on 03/13/2016 with:   DIAGNOSIS: Metastatic non-small cell lung cancer initially diagnosed as Stage IIIA (T2a., N2, M0) non-small cell lung cancer adenocarcinoma with negative EGFR mutation and negative ALK gene translocation involving the left upper lobe middle mediastinal lymphadenopathy diagnosed in August of 2014. He had metastatic disease to the brain diagnosed in December 2016.  Lung cancer   Primary site: Lung (Left)   Staging method: AJCC 7th Edition   Clinical: Stage IIIA (T2a, N2, M0) signed by [name], MD on 06/19/2013  5:06 PM   Summary: Stage IIIA (T2a, N2, M0).  Molecular biomarkers: Positive for [id number]*7, [id number] ATM splice site [id number]>DU TP53 E271K, SETD2 [id number] and SPOP E47K                                          Negative for: RET, ALK, BRAF, KRAS, ERBB2, MET and EGFR.  PRIOR THERAPY:  1) Status post left upper lobectomy with mediastinal lymph node dissection on 05/29/2013. 2) Adjuvant chemotherapy with cisplatin at 75 mg per meter squared and Alimta at 500 mg per meter squared given every 3 weeks for a total of 4 cycles. First cycle given on 07/10/2013 3) curative radiotherapy to the mediastinum under the care of Dr. [name] on 12/12/2013. 4) status post stereotactic radiotherapy to the metastatic brain lesion on 08/25/2015 under the care of Dr. [name]  Patient and family report continued physical and functional decline over the past two months.  He " just wants someone to tell what is really going on and what to expect".  It has  gotten emotionally more difficult for patient and family facing concept of mortality and loss of independence   Clinical Assessment and Goals of Care:   This NP MaWadie Lesseneviewed medical records, received report from team, assessed the patient and then [facility] and daughter  to discuss diagnosis, prognosis, GOC and options and overall experience of living with terminal disease.   A discussion was had today regarding advanced directives.  Concepts specific to code status, artifical feeding and hydration, continued IV antibiotics and rehospitalization was had.  The difference between a aggressive medical intervention path  and a palliative comfort care path  was had.  Values and goals of care important to patient and family were attempted to be elicited.  Concept of Hospice and Palliative Care were discussed  Natural trajectory and expectations at EOL were discussed.  Questions and concerns addressed. MOST form was introduced. Family encouraged to call with questions or concerns.  PMT will continue to  support holistically.    SUMMARY OF RECOMMENDATIONS    Patient has appointment tomorrow for CT Scan and lab work.  He hopes that this will give him more information to know what is "going on with this disease".  Family request to call me for f/u appointment after tomorrow  Code Status/Advance Care Planning:  DNR    Palliative Prophylaxis:   Aspiration, Bowel Regimen, Delirium Protocol, Frequent Pain Assessment and Oral Care  We discussed the importance of hydration, nutrition, sleep, exercise as iterated and incorporation of mindfulness techniques into daily routine.  Psycho-social/Spiritual:    Additional Recommendations: Education on Hospice  Prognosis:   Unable to determine     Primary Diagnoses: Present on Admission: **None**   I have reviewed the medical record, interviewed the patient and family, and  examined the patient. The following aspects are pertinent.  Past Medical History:  Diagnosis Date  . AAA (abdominal aortic aneurysm) ([place])   . Asymptomatic cholelithiasis 07/2015   Incidental finding on PET CT  . Coronary artery disease    MI 1992, S/P  PTCA; negative stress test in November 2011 with no ischemia.   . Diverticulosis   . Hearing loss    Bilateral   . History of radiation therapy 11/10/13-12/12/13   lung,50Gy/55f  . Hyperlipidemia   . Hypertension   . [place]   non-small cell;  L upper lobectomy with mediastinal LN dissection, chemo, and radiation in 2014/2015. Remission until pt had questionable seizure 07/2015--MRI showed brain mets; palliative brain rad (stereotactic radiation therapy) started 08/25/15  . Malignant ne[place] of upper lobe, left bronchus or lung ([facility] 10[place]29[place]  . [facility] [facility]  . Myocardial infarction ([facility] 1992   Dr MAngelena Form   . Peripheral vascular disease ([facility] 1[place]   4.8x4.6 cm infrarenal abdominal aortic fusiform aneurysm, 1.5 cm right common iliac artery aneurysm  . [facility]   Stereotactic radiation therapy: frontoparietal 18gy,posterior frontal lobe 20gy,  . Shortness of breath   . Tobacco dependence    "Quit" 1992, but pt has smoked "on and off" since that time   Social History   Social History  . Marital status: Married    Spouse name: N/A  . Number of children: N/A  . Years of education: N/A   Occupational History  . Retired tRisk analyst   Social History Main Topics  . Smoking status: Current Every Day Smoker    Packs/day: 1.00    Years: 24.00    Types: Cigarettes    Last attempt to quit: 05/28/2013  . Smokeless tobacco: Never Used     Comment: QUIT 15 YEARS AGO  . Alcohol use 9.0 oz/week    3 Glasses of wine, 12 Cans of beer per week     Comment: 1-2 beers a day  . Drug use: No  . Sexual activity: Not on file   Other  Topics Concern  . Not on file   Social History Narrative   HEART HEALTHY DIET.     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   [place], [place]  Smoked on and off since then.   ALCOHOL USE -YES- RED WINE SOCIALLY, couple of beers at night usually.   Family History  Problem Relation Age of Onset  . Hypertension Mother   . Prostate cancer Father     Prostate cancer  . Cancer Father  Brain Tumor  . Lung cancer Sister     NON SMOKER  . Cancer Sister   . Hyperlipidemia Sister   . Diabetes Neg Hx   . Stroke Neg Hx   . Hyperlipidemia Brother   . Heart attack Brother   . Hypertension Brother    Scheduled Meds: Continuous Infusions: PRN Meds:. Medications Prior to Admission:  Prior to Admission medications   Medication Sig Start Date End Date Taking? Authorizing Provider  amLODipine (NORVASC) 5 MG tablet TAKE 1 TABLET (5 MG TOTAL) BY MOUTH DAILY. 10/18/15   Tammi Sou, MD  aspirin EC 81 MG tablet Take 81 mg by mouth daily.    Historical Provider, MD  budesonide-formoterol (SYMBICORT) 160-4.5 MCG/ACT inhaler Inhale 1-2 puffs into the lings every 12 hours. Gargle and Spit after use. 02/29/16   Tammi Sou, MD  Glucosamine-Chondroit-Vit C-Mn (GLUCOSAMINE 1500 COMPLEX PO) Take 1,500 mg by mouth daily.     Historical Provider, MD  levETIRAcetam (KEPPRA XR) 500 MG 24 hr tablet Take 2 tablet every night 03/09/16   Cameron Sprang, MD  metoprolol (LOPRESSOR) 50 MG tablet Take 1 tablet (50 mg total) by mouth 2 (two) times daily. 10/18/15   Tammi Sou, MD  Multiple Vitamin (MULTIVITAMIN) tablet Take 1 tablet by mouth every evening.     Historical Provider, MD  pentoxifylline (TRENTAL) 400 MG CR tablet Take 1 tablet (400 mg total) by mouth 3 (three) times daily. 12/06/15   Hayden Pedro, PA-C  rosuvastatin (CRESTOR) 40 MG tablet TAKE 1 TABLET (40 MG TOTAL) BY MOUTH DAILY. 11/15/15   Tammi Sou, MD  vitamin E 1000 UNIT capsule Take 1  capsule (1,000 Units total) by mouth daily. 12/06/15   Hayden Pedro, PA-C   Allergies  Allergen Reactions  . Fluvirin [Influenza Vac Typ A&B Surf Ant] Nausea And Vomiting and Other (See Comments)    Seizure like activity?  Marland Kitchen Iodinated Diagnostic Agents Hives    1 hive on lt cheek lasting approximately 1 hour on last 2 CT per pt; needs pre meds in future; 50 mg benadryl po 1 hr prior to exam per Dr. Weber Cooks   Review of Systems  Constitutional: Positive for activity change and fatigue.  Neurological: Positive for weakness.    Physical Exam  Constitutional: He appears well-developed.  HENT:  Mouth/Throat: Oropharynx is clear and moist.  Musculoskeletal:  - BLE weakness, greater on right  Skin: Skin is warm and dry.    Vital Signs: There were no vitals taken for this visit.         SpO2:   O2 Device:  O2 Flow Rate: .   IO: Intake/output summary: No intake or output data in the 24 hours ending 03/13/16 1531  LBM:   Baseline Weight:   Most recent weight:       Palliative Assessment/Data:     Time In: 0900 Time Out: 1015 Time Total: 75 min  Greater than 50%  of this time was spent counseling and coordinating care related to the above assessment and plan.  Signed by: Wadie Lessen, NP   Please contact Palliative Medicine Team phone at [phone] for questions and concerns.  For individual provider: See Shea Evans

## 2016-03-14 ENCOUNTER — Ambulatory Visit (HOSPITAL_COMMUNITY)
Admission: RE | Admit: 2016-03-14 | Discharge: 2016-03-14 | Disposition: A | Discharge status: Home / Self Care | Payer: Medicare Other | Source: Ambulatory Visit | Attending: Internal Medicine | Admitting: Internal Medicine

## 2016-03-14 ENCOUNTER — Other Ambulatory Visit (HOSPITAL_BASED_OUTPATIENT_CLINIC_OR_DEPARTMENT_OTHER): Payer: Medicare Other

## 2016-03-14 DIAGNOSIS — Z515 Encounter for palliative care: Secondary | ICD-10-CM | POA: Insufficient documentation

## 2016-03-14 DIAGNOSIS — M438X6 Other specified deforming dorsopathies, lumbar region: Secondary | ICD-10-CM | POA: Insufficient documentation

## 2016-03-14 DIAGNOSIS — C3412 Malignant neoplasm of upper lobe, left bronchus or lung: Secondary | ICD-10-CM

## 2016-03-14 DIAGNOSIS — Z66 Do not resuscitate: Secondary | ICD-10-CM | POA: Insufficient documentation

## 2016-03-14 DIAGNOSIS — Z9889 Other specified postprocedural states: Secondary | ICD-10-CM | POA: Diagnosis not present

## 2016-03-14 DIAGNOSIS — I714 Abdominal aortic aneurysm, without rupture: Secondary | ICD-10-CM | POA: Diagnosis not present

## 2016-03-14 LAB — COMPREHENSIVE METABOLIC PANEL
ALT: 24 U/L (ref 0–55)
AST: 19 U/L (ref 5–34)
Albumin: 4 g/dL (ref 3.5–5.0)
Alkaline Phosphatase: 61 U/L (ref 40–150)
Anion Gap: 8 mEq/L (ref 3–11)
BUN: 10.8 mg/dL (ref 7.0–26.0)
CO2: 29 mEq/L (ref 22–29)
Calcium: 10.3 mg/dL (ref 8.4–10.4)
Chloride: 107 mEq/L (ref 98–109)
Creatinine: 0.9 mg/dL (ref 0.7–1.3)
EGFR: 84 mL/min/{1.73_m2} — ABNORMAL LOW (ref >90)
Glucose: 101 mg/dl (ref 70–140)
Potassium: 5.3 mEq/L — ABNORMAL HIGH (ref 3.5–5.1)
Sodium: 144 mEq/L (ref 136–145)
Total Bilirubin: 0.46 mg/dL (ref 0.20–1.20)
Total Protein: 7.8 g/dL (ref 6.4–8.3)

## 2016-03-14 LAB — CBC WITH DIFFERENTIAL/PLATELET
BASO%: 0.6 % (ref 0.0–2.0)
Basophils Absolute: 0.1 10*3/uL (ref 0.0–0.1)
EOS%: 1.8 % (ref 0.0–7.0)
Eosinophils Absolute: 0.2 10*3/uL (ref 0.0–0.5)
HCT: 46.8 % (ref 38.4–49.9)
HGB: 15.7 g/dL (ref 13.0–17.1)
LYMPH%: 14.4 % (ref 14.0–49.0)
MCH: 28.1 pg (ref 27.2–33.4)
MCHC: 33.5 g/dL (ref 32.0–36.0)
MCV: 83.9 fL (ref 79.3–98.0)
MONO#: 0.7 10*3/uL (ref 0.1–0.9)
MONO%: 6.1 % (ref 0.0–14.0)
NEUT#: 8.3 10*3/uL — ABNORMAL HIGH (ref 1.5–6.5)
NEUT%: 77.1 % — ABNORMAL HIGH (ref 39.0–75.0)
Platelets: 207 10*3/uL (ref 140–400)
RBC: 5.58 10*6/uL (ref 4.20–5.82)
RDW: 15.2 % — ABNORMAL HIGH (ref 11.0–14.6)
WBC: 10.8 10*3/uL — ABNORMAL HIGH (ref 4.0–10.3)
lymph#: 1.6 10*3/uL (ref 0.9–3.3)

## 2016-03-14 MED ORDER — IOPAMIDOL (ISOVUE-300) INJECTION 61%
100.0000 mL | Freq: Once | INTRAVENOUS | Status: AC | PRN
  Administered 2016-03-14: 100 mL via INTRAVENOUS

## 2016-03-14 MED ORDER — IIOPAMIDOL (ISOVUE-250) INJECTION 51%: 100.0000 mL | Freq: Once | INTRAVENOUS | Status: DC | PRN

## 2016-03-15 ENCOUNTER — Telehealth: Payer: Self-pay | Admitting: Internal Medicine

## 2016-03-15 ENCOUNTER — Ambulatory Visit (HOSPITAL_BASED_OUTPATIENT_CLINIC_OR_DEPARTMENT_OTHER): Payer: Medicare Other | Admitting: Internal Medicine

## 2016-03-15 ENCOUNTER — Encounter: Payer: Self-pay | Admitting: Internal Medicine

## 2016-03-15 VITALS — BP 143/83 | HR 76 | Temp 98.3°F | Resp 18 | Ht 64.0 in | Wt 146.6 lb

## 2016-03-15 DIAGNOSIS — C7931 Secondary malignant neoplasm of brain: Secondary | ICD-10-CM

## 2016-03-15 DIAGNOSIS — C3412 Malignant neoplasm of upper lobe, left bronchus or lung: Secondary | ICD-10-CM | POA: Diagnosis not present

## 2016-03-15 DIAGNOSIS — E875 Hyperkalemia: Secondary | ICD-10-CM | POA: Diagnosis not present

## 2016-03-15 NOTE — Telephone Encounter (Signed)
per pof to sch tp appt-gave pt copy of avs*gave contrast

## 2016-03-15 NOTE — Progress Notes (Signed)
[facility]  Telephone:([phone] Fax:([phone]) [phone] OFFICE PROGRESS NOTE  Tammi Sou, MD [address] [place] [address]  DIAGNOSIS: Metastatic non-small cell lung cancer initially diagnosed as Stage IIIA (T2a., N2, M0) non-small cell lung cancer adenocarcinoma with negative EGFR mutation and negative ALK gene translocation involving the left upper lobe middle mediastinal lymphadenopathy diagnosed in August of 2014. He had metastatic disease to the brain diagnosed in December 2016.  Lung cancer   Primary site: Lung (Left)   Staging method: AJCC 7th Edition   Clinical: Stage IIIA (T2a, N2, M0) signed by Jordan Bears, MD on 06/19/2013  5:06 PM   Summary: Stage IIIA (T2a, N2, M0).  Molecular biomarkers: Positive for [id number]*7, [id number] ATM splice site [id number]>DX TP53 E271K, SETD2 [id number] and SPOP E47K                                          Negative for: RET, ALK, BRAF, KRAS, ERBB2, MET and EGFR.  PRIOR THERAPY:  1) Status post left upper lobectomy with mediastinal lymph node dissection on 05/29/2013. 2) Adjuvant chemotherapy with cisplatin at 75 mg per meter squared and Alimta at 500 mg per meter squared given every 3 weeks for a total of 4 cycles. First cycle given on 07/10/2013 3) curative radiotherapy to the mediastinum under the care of Dr. [name] on 12/12/2013. 4) status post stereotactic radiotherapy to the metastatic brain lesion on 08/25/2015 under the care of Dr. MoLisbeth Mccullough CURRENT THERAPY: Observation.  DISEASE STAGE: Lung cancer   Primary site: Lung (Left)   Staging method: AJCC 7th Edition   Clinical: Stage IIIA (T2a, N2, M0) signed by [name] on 06/19/2013  5:06 PM   Summary: Stage IIIA (T2a, N2, M0)  CHEMOTHERAPY INTENT: Control/curative  CURRENT # OF CHEMOTHERAPY CYCLES: 0  CURRENT ANTIEMETICS: Aloxi, dexamethasone, Emend, Compazine  CURRENT SMOKING STATUS: Former smoker  ORAL  CHEMOTHERAPY AND CONSENT: n/a  CURRENT BISPHOSPHONATES USE: none  PAIN MANAGEMENT: 0/10  NARCOTICS INDUCED CONSTIPATION: None  LIVING WILL AND CODE STATUS: Full code   INTERVAL HISTORY: Jordan DROST89.o. male returns for 4 month followup visit accompanied by his wife and daughter. The patient is feeling fine today with no specific complaints except for fatigue and weakness in left side of his body after the brain metastasis. He is followed by Dr. NuRita Oharand started on treatment with Trental. He also had some twitching of his left side of the face and his dose of Keppra was increased. He is otherwise feeling fine. He denied having any significant nausea or vomiting, no fever or chills. The patient denied having any significant chest pain, shortness of breath, or hemoptysis. He had repeat CT scan of the chest, abdomen and pelvis performed recently and he is here for evaluation and discussion of his scan results.  MEDICAL HISTORY: Past Medical History:  Diagnosis Date  . AAA (abdominal aortic aneurysm) ([facility]  . Asymptomatic cholelithiasis 07/2015   Incidental finding on PET CT  . Coronary artery disease    MI 1992, S/P  PTCA; negative stress test in November 2011 with no ischemia.   . Diverticulosis   . Hearing loss    Bilateral   . History of radiation therapy 11/10/13-12/12/13   lung,50Gy/2552f. Hyperlipidemia   . Hypertension   . Lung cancer ([facility]14   non-small  cell;  L upper lobectomy with mediastinal LN dissection, chemo, and radiation in 2014/2015. Remission until pt had questionable seizure 07/2015--MRI showed brain mets; palliative brain rad (stereotactic radiation therapy) started 08/25/15  . Malignant neoplasm of upper lobe, left bronchus or lung ([place]) 10/29/2013  . Malignant neoplasm of upper lobe, left bronchus or lung ([place]) 06/26/2013  . Myocardial infarction [facility]) 1992   Dr Jordan Mccullough    . Peripheral vascular disease ([place]) 1/14   4.8x4.6 cm infrarenal abdominal  aortic fusiform aneurysm, 1.5 cm right common iliac artery aneurysm  . S/P radiation therapy [facility] 08/25/15   Stereotactic radiation therapy: frontoparietal 18gy,posterior frontal lobe 20gy,  . Shortness of breath   . Tobacco dependence    "Quit" 1992, but pt has smoked "on and off" since that time    ALLERGIES:  is allergic to fluvirin [influenza vac typ a&b surf ant] and iodinated diagnostic agents.  MEDICATIONS:  Current Outpatient Prescriptions  Medication Sig Dispense Refill  . amLODipine (NORVASC) 5 MG tablet TAKE 1 TABLET (5 MG TOTAL) BY MOUTH DAILY. 90 tablet 3  . aspirin EC 81 MG tablet Take 81 mg by mouth daily.    . budesonide-formoterol (SYMBICORT) 160-4.5 MCG/ACT inhaler Inhale 1-2 puffs into the lings every 12 hours. Gargle and Spit after use. 10.2 Inhaler 3  . Glucosamine-Chondroit-Vit C-Mn (GLUCOSAMINE 1500 COMPLEX PO) Take 1,500 mg by mouth daily.     Marland Kitchen levETIRAcetam (KEPPRA XR) 500 MG 24 hr tablet Take 2 tablet every night 180 tablet 3  . metoprolol (LOPRESSOR) 50 MG tablet Take 1 tablet (50 mg total) by mouth 2 (two) times daily. 180 tablet 3  . Multiple Vitamin (MULTIVITAMIN) tablet Take 1 tablet by mouth every evening.     . pentoxifylline (TRENTAL) 400 MG CR tablet Take 1 tablet (400 mg total) by mouth 3 (three) times daily. 90 tablet 5  . rosuvastatin (CRESTOR) 40 MG tablet TAKE 1 TABLET (40 MG TOTAL) BY MOUTH DAILY. 30 tablet 6  . vitamin E 1000 UNIT capsule Take 1 capsule (1,000 Units total) by mouth daily. 30 capsule 12   No current facility-administered medications for this visit.     SURGICAL HISTORY:  Past Surgical History:  Procedure Laterality Date  . ABDOMINAL AORTIC ENDOVASCULAR STENT GRAFT N/A 05/07/2014   Procedure: ABDOMINAL AORTIC ENDOVASCULAR STENT GRAFT;  Surgeon: Serafina Mitchell, MD;  Location: [facility];  Service: Vascular;  Laterality: N/A;  . Carotid duplex dopplers  07/2015   1-39% on R, no signif dz noted on L  . COLONOSCOPY W/ POLYPECTOMY  2003      negative 2010,due 2020; Dr Olevia Perches  . CORONARY ANGIOPLASTY     no stents  . CYSTOSCOPY/RETROGRADE/URETEROSCOPY Bilateral 10/09/2012   Procedure: BILATERAL RETROGRADE bladder and urethral BIOPSY ;  Surgeon: Molli Hazard, MD;  Location: [facility];  Service: Urology;  Laterality: Bilateral;  BILATERAL RETROGRADE   . EEG  08/05/15   Pt placed on keppra just prior to this test due to having ? seizure (MRI showed brain mets)  . HERNIA REPAIR Bilateral    Inguinal  . INGUINAL HERIIORRHAPHY BILATERALLY    . MEDIASTINOSCOPY N/A 05/01/2013   Procedure: MEDIASTINOSCOPY;  Surgeon: Gaye Pollack, MD;  Location: [facility] OR;  Service: Thoracic;  Laterality: N/A;  . PFTs  04/2013   Minimal obstructive airway disease  . PILONIDAL CYST EXCISION    . PROSTATE BIOPSY N/A 10/09/2012   Procedure: PROSTATIC URETHRAL BIOPSY;  Surgeon: Molli Hazard, MD;  Location: [facility];  Service: Urology;  Laterality: N/A;  PROSTATIC URETHRAL BIOPSY   . PTCA  1992  . ROTATOR CUFF REPAIR     Bilateral  . THOROCOTOMY WITH LOBECTOMY Left 05/29/2013   Procedure: LEFT THOROCOTOMY WITH LEFT UPPER LOBE LOBECTOMY;  Surgeon: Gaye Pollack, MD;  Location: [facility];  Service: Thoracic;  Laterality: Left;  Marland Kitchen VIDEO BRONCHOSCOPY N/A 05/01/2013   Procedure: VIDEO BRONCHOSCOPY;  Surgeon: Gaye Pollack, MD;  Location: [facility] OR;  Service: Thoracic;  Laterality: N/A;    REVIEW OF SYSTEMS:  A comprehensive review of systems was negative except for: Constitutional: positive for fatigue Musculoskeletal: positive for muscle weakness   PHYSICAL EXAMINATION: General appearance: alert, cooperative, appears stated age and no distress Head: Normocephalic, without obvious abnormality, atraumatic Neck: no adenopathy, no carotid bruit, no JVD, supple, symmetrical, trachea midline and thyroid not enlarged, symmetric, no tenderness/mass/nodules Lymph nodes: Cervical, supraclavicular, and axillary nodes normal. Resp: clear to auscultation  bilaterally Cardio: S1, S2 normal and Occasional missed/skipped beat(s) GI: soft, non-tender; bowel sounds normal; no masses,  no organomegaly Extremities: extremities normal, atraumatic, no cyanosis or edema Neurologic: Alert and oriented X 3, normal strength and tone. Normal symmetric reflexes. Normal coordination and gait  ECOG PERFORMANCE STATUS: 1 - Symptomatic but completely ambulatory  Blood pressure (!) 143/83, pulse 76, temperature 98.3 F (36.8 C), temperature source Oral, resp. rate 18, height _0  (1.626 m), weight 146 lb 9.6 oz (66.5 kg), SpO2 99 %.  LABORATORY DATA: Lab Results  Component Value Date   WBC 10.8 (H) 03/14/2016   HGB 15.7 03/14/2016   HCT 46.8 03/14/2016   MCV 83.9 03/14/2016   PLT 207 03/14/2016      Chemistry      Component Value Date/Time   NA 144 03/14/2016 1045   K 5.3 (H) 03/14/2016 1045   CL 101 09/16/2015 0927   CO2 29 03/14/2016 1045   BUN 10.8 03/14/2016 1045   CREATININE 0.9 03/14/2016 1045      Component Value Date/Time   CALCIUM 10.3 03/14/2016 1045   ALKPHOS 61 03/14/2016 1045   AST 19 03/14/2016 1045   ALT 24 03/14/2016 1045   BILITOT 0.46 03/14/2016 1045       RADIOGRAPHIC STUDIES: Ct Chest W Contrast  Result Date: 03/14/2016 CLINICAL DATA:  Left upper lobe non-small cell lung cancer diagnosed in 2014. Intracranial metastatic disease for which the patient completed radiation therapy 6 months ago. Weakness. EXAM: CT CHEST, ABDOMEN, AND PELVIS WITH CONTRAST TECHNIQUE: Multidetector CT imaging of the chest, abdomen and pelvis was performed following the standard protocol during bolus administration of intravenous contrast. CONTRAST:  150m ISOVUE-300 IOPAMIDOL (ISOVUE-300) INJECTION 61% COMPARISON:  CTs 12/13/2015 and PET-CT 07/02/2015 FINDINGS: CT CHEST Cardiovascular: Diffuse atherosclerosis and tortuosity of the aorta and great vessels again noted. There is coronary artery atherosclerosis. The pulmonary arteries appear  unremarkable. No acute vascular findings are seen. The heart size is normal. There is a small pericardial effusion which appears unchanged. Mediastinum/Nodes: There are no enlarged mediastinal, hilar or axillary lymph nodes. The thyroid gland, trachea and esophagus demonstrate no significant findings. Lungs/Pleura: There is no pleural effusion. There are stable postsurgical changes status post left upper lobe resection. There is stable distortion of the left lower lobe bronchus with paramediastinal radiation changes. There is some dependent lobularity within the left mainstem bronchus which may reflect secretions. No evidence of bronchial occlusion. There are no suspicious pulmonary nodules. Mild emphysema noted. Musculoskeletal/Chest wall: No chest wall mass or suspicious osseous findings. No  suspicious left rib abnormality. CT ABDOMEN AND PELVIS FINDINGS Hepatobiliary: There is a stable small hypervascular lesion in the dome of the right hepatic lobe on image 46, likely a hemangioma. No suspicious hepatic findings. Calcified gallstones are noted. There is no evidence of gallbladder wall thickening or biliary dilatation. Pancreas: Unremarkable. No pancreatic ductal dilatation or surrounding inflammatory changes. Spleen: Normal in size without focal abnormality. Adrenals/Urinary Tract: Both adrenal glands appear normal. Bilateral renal cysts and cortical scarring in the lower pole of the right kidney are grossly stable. No evidence of renal mass or hydronephrosis. The bladder appears stable without focal abnormality. Stomach/Bowel: No evidence of bowel wall thickening, distention or surrounding inflammatory change. Moderate stool throughout the colon, especially within the rectum. The appendix appears normal. Vascular/Lymphatic: There are no enlarged abdominal or pelvic lymph nodes. Status post endovascular aorto bi-iliac grafting. Persistent known type 2 endograft leak near the IMA origin, not grossly changed. The  overall size of the abdominal aortic aneurysm is also grossly stable, measuring 5.5 x 5.1 cm transverse. No evidence of retroperitoneal hemorrhage or large vessel occlusion. Reproductive: Stable mildly nodular enlargement of the prostate gland. The seminal vesicles appear unremarkable. Other: No ascites or peritoneal nodularity. The anterior abdominal wall appears unchanged. Musculoskeletal: No acute or significant osseous findings. Superior endplate compression deformities at L4 and L5 are unchanged. IMPRESSION: 1. Stable chest CT with postsurgical and post radiation changes as described. No evidence of local recurrence or metastatic disease. 2. No evidence of metastatic disease within the abdomen or pelvis. 3. Grossly stable abdominal aortic aneurysm with persistent type 2 endograft leak status post endograft stenting. 4. Stable cortical scarring in the lower pole of the left kidney. 5. Stable superior endplate compression deformities at L4 and L5. No worrisome osseous findings. Electronically Signed   By: Richardean Sale M.D.   On: 03/14/2016 16:35  Ct Abdomen Pelvis W Contrast  Result Date: 03/14/2016 CLINICAL DATA:  Left upper lobe non-small cell lung cancer diagnosed in 2014. Intracranial metastatic disease for which the patient completed radiation therapy 6 months ago. Weakness. EXAM: CT CHEST, ABDOMEN, AND PELVIS WITH CONTRAST TECHNIQUE: Multidetector CT imaging of the chest, abdomen and pelvis was performed following the standard protocol during bolus administration of intravenous contrast. CONTRAST:  187m ISOVUE-300 IOPAMIDOL (ISOVUE-300) INJECTION 61% COMPARISON:  CTs 12/13/2015 and PET-CT 07/02/2015 FINDINGS: CT CHEST Cardiovascular: Diffuse atherosclerosis and tortuosity of the aorta and great vessels again noted. There is coronary artery atherosclerosis. The pulmonary arteries appear unremarkable. No acute vascular findings are seen. The heart size is normal. There is a small pericardial effusion  which appears unchanged. Mediastinum/Nodes: There are no enlarged mediastinal, hilar or axillary lymph nodes. The thyroid gland, trachea and esophagus demonstrate no significant findings. Lungs/Pleura: There is no pleural effusion. There are stable postsurgical changes status post left upper lobe resection. There is stable distortion of the left lower lobe bronchus with paramediastinal radiation changes. There is some dependent lobularity within the left mainstem bronchus which may reflect secretions. No evidence of bronchial occlusion. There are no suspicious pulmonary nodules. Mild emphysema noted. Musculoskeletal/Chest wall: No chest wall mass or suspicious osseous findings. No suspicious left rib abnormality. CT ABDOMEN AND PELVIS FINDINGS Hepatobiliary: There is a stable small hypervascular lesion in the dome of the right hepatic lobe on image 46, likely a hemangioma. No suspicious hepatic findings. Calcified gallstones are noted. There is no evidence of gallbladder wall thickening or biliary dilatation. Pancreas: Unremarkable. No pancreatic ductal dilatation or surrounding inflammatory changes. Spleen: Normal  in size without focal abnormality. Adrenals/Urinary Tract: Both adrenal glands appear normal. Bilateral renal cysts and cortical scarring in the lower pole of the right kidney are grossly stable. No evidence of renal mass or hydronephrosis. The bladder appears stable without focal abnormality. Stomach/Bowel: No evidence of bowel wall thickening, distention or surrounding inflammatory change. Moderate stool throughout the colon, especially within the rectum. The appendix appears normal. Vascular/Lymphatic: There are no enlarged abdominal or pelvic lymph nodes. Status post endovascular aorto bi-iliac grafting. Persistent known type 2 endograft leak near the IMA origin, not grossly changed. The overall size of the abdominal aortic aneurysm is also grossly stable, measuring 5.5 x 5.1 cm transverse. No  evidence of retroperitoneal hemorrhage or large vessel occlusion. Reproductive: Stable mildly nodular enlargement of the prostate gland. The seminal vesicles appear unremarkable. Other: No ascites or peritoneal nodularity. The anterior abdominal wall appears unchanged. Musculoskeletal: No acute or significant osseous findings. Superior endplate compression deformities at L4 and L5 are unchanged. IMPRESSION: 1. Stable chest CT with postsurgical and post radiation changes as described. No evidence of local recurrence or metastatic disease. 2. No evidence of metastatic disease within the abdomen or pelvis. 3. Grossly stable abdominal aortic aneurysm with persistent type 2 endograft leak status post endograft stenting. 4. Stable cortical scarring in the lower pole of the left kidney. 5. Stable superior endplate compression deformities at L4 and L5. No worrisome osseous findings. Electronically Signed   By: Richardean Sale M.D.   On: 03/14/2016 16:35  ASSESSMENT/PLAN: This is a very pleasant 75 year old Caucasian male recently diagnosed with metastatic non-small cell lung cancer initially diagnosed as stage IIIA (T2a., N2, M0) non-small cell lung cancer, adenocarcinoma involving the left upper lobe and mediastinal lymphadenopathy diagnosed in August of 2014. He is status post left upper lobectomy with mediastinal lymph node dissection on 05/29/2013. He is status post 4 cycles of adjuvant chemotherapy with cisplatin and Alimta. He was also found to have metastatic brain lesion and underwent stereotactic radiotherapy in January 2017. CT scan of the chest, abdomen and pelvis performed yesterday showed no evidence for disease recurrence or progression. I discussed the scan results with the patient and his family today. I recommended for him to continue on observation with repeat CT scan of the chest, abdomen and pelvis in 3 months for reevaluation of his disease. For the metastatic brain lesion, he will continue his  routine follow-up visit and evaluation by Dr. Lisbeth Mccullough and Dr. Sherwood Gambler. For the hyperkalemia, I recommended for the patient to decrease the potassium-rich diet. He was advised to call immediately if he has any concerning symptoms in the interval. All questions were answered. The patient knows to call the clinic with any problems, questions or concerns. We can certainly see the patient much sooner if necessary.  Disclaimer: This note was dictated with voice recognition software. Similar sounding words can inadvertently be transcribed and may not be corrected upon review.

## 2016-03-21 ENCOUNTER — Ambulatory Visit (INDEPENDENT_AMBULATORY_CARE_PROVIDER_SITE_OTHER): Payer: Medicare Other | Admitting: Neurology

## 2016-03-21 ENCOUNTER — Encounter: Payer: Self-pay | Admitting: Neurology

## 2016-03-21 VITALS — BP 134/80 | HR 98 | Ht 65.5 in | Wt 147.0 lb

## 2016-03-21 DIAGNOSIS — G40209 Localization-related (focal) (partial) symptomatic epilepsy and epileptic syndromes with complex partial seizures, not intractable, without status epilepticus: Secondary | ICD-10-CM | POA: Diagnosis not present

## 2016-03-21 DIAGNOSIS — C7931 Secondary malignant neoplasm of brain: Secondary | ICD-10-CM | POA: Diagnosis not present

## 2016-03-21 NOTE — Progress Notes (Signed)
NEUROLOGY FOLLOW UP OFFICE NOTE  Jordan Mccullough [id number]  HISTORY OF PRESENT ILLNESS: I had the pleasure of seeing Jordan Mccullough in follow-up in the neurology clinic on 03/21/2016. The patient was last seen 5 months ago after he had a new onset convulsive seizure last 08/04/15 and was found to have brain metastases to the right frontal lobe. He was started on Keppra and initially had some difficulties tolerating the medication, but felt better on low dose extended-release Keppra '500mg'$  daily. He had been seizure-free for almost 7 months until he had focal motor seizures affecting the left side of his face at the beginning of July. He had 5 in one day lasting 5-6 seconds, then stopped. His wife called our office to report 4 facial seizures on 7/19 and one on 7/20, these were lasting 5-6 seconds, he could feel it coming from the left jawline going up to his forehead, with facial twitching seen. No associated headache or confusion. He has a constant feeling of numbness on the left side of his face, "like you had Novocaine." He continues to have left arm and leg weakness that has been chronic, no twitching involved. He feels he has a hard time controlling them. He denies any falls. Keppra dose was increased to '1000mg'$  qhs, he is tolerating this well with no side effects. He continues to have easy fatigability, stating it's a "real project" to get dressed in the morning or put dishes in the dishwasher. He denies any dizziness, diplopia, dysarthria, dysphagia, focal numbness/tingling/weakness, olfactory/gustatory hallucinations, myoclonic jerks.   I personally reviewed MRI brain with and without contrast done 01/18/16 which showed further mild enlargement of both treated rim-enhancing lesions in the right hemisphere with stable to slightly increased surrounding vasogenic edema and mild regional mass effect, seen over the right alteral sensory strip and suprior right motor strip, favor treatment  effect/pseudo-progression over true progression of disease. No new brain metastasis seen.   HPI 10/11/15: This is a pleasant 75 yo RH man with a history of lung cancer s/p lobectomy, chemotherapy and radiation, hyperlipidemia, CAD s/p MI, peripheral vascular disease, abdominal aortic aneurysm, admitted to [facility] last 08/04/15 after a new onset seizure and found to have brain metastases. He states he got his flu shot then went to work, got home and started having a beer the threw up, his eyes got blurry to the point he could not see anything, then has no recollection of events. According to ER notes, after the nausea and vomiting, he decided to drink beer to feel better, his wife left the room then came back to find him with arms extended, shaking, eyes rolled back lasting 5-10 seconds. He recalls coming to without feeling confused, no focal weakness. He was brought to [facility] ER where CT head showed a 1.6 x 1.5 cm hyperdense mass in the right frontal lobe worrisome for focal metastases. MRI was done showing 2 enhancing masses in the right frontal lobe measuring up to 2 x 1.7 cm, no mass effect. He was started on Keppra '500mg'$  BID with no further seizures. He started stereotactic radiation, with plan for repeat MRI in April.   He denies any prior history of seizures. His wife denies any staring/unresponsive episodes, no gaps in time, olfactory/gustatory hallucinations, deja vu, rising epigastric sensation, focal numbness/tingling/weakness, myoclonic jerks, headaches, dysarthria/dysphagia, bowel/bladder dysfunction. He was discharged home on Decadron and Keppra, and reports that he was "just totally out of it," functioning only 50%. He felt significantly better getting of Decadron, but  continued to have generalized weakness, particularly in the legs, blurred vision, and reported this to his PCP Dr. Anitra Lauth. Keppra dose was reduced to '250mg'$  BID 5 days ago, and he reports that symptoms are better, but still there. He states  that he wakes up feeling fine, but then starts feeling the symptoms after taking morning Keppra dose. He has mild 2-3 over 10 back pain when walking, relieved when he sits down. He continues to drink beer daily but has reduced to 1-2 a day from 3-5 beers a day. His wife reports he would drink 3 beer cases in a week.  Epilepsy Risk Factors: Brain metastases in the right frontal lobe. Otherwise he had a normal birth and early development. There is no history of febrile convulsions, CNS infections such as meningitis/encephalitis, significant traumatic brain injury, neurosurgical procedures, or family history of seizures.   I personally reviewed MRI brain with and without contrast done 08/12/15 which showed an 8x43m centrally necrotic metastasis in the right posterior frontal lobe; there is a centrally necrotic mass measuring 135mat the right vertex frontoparietal junction with regional vasogenic edema. Routine EEG 08/05/15 was a normal wake and sleep study  PAST MEDICAL HISTORY: Past Medical History:  Diagnosis Date  . AAA [place]abdominal aortic aneurysm) ([facility]  . Asymptomatic cholelithiasis 07/2015   Incidental finding on PET CT  . Coronary artery disease    MI 1992, S/P  PTCA; negative stress test in November 2011 with no ischemia.   . Diverticulosis   . Hearing loss    Bilateral   . History of radiation therapy 11/10/13-12/12/13   lung,50Gy/2566f. Hyperlipidemia   . Hypertension   . Lung cancer ([facility]   non-small cell;  L upper lobectomy with mediastinal LN dissection, chemo, and radiation in 2014/2015. Remission until pt had questionable seizure 07/2015--MRI showed brain mets; palliative brain rad (stereotactic radiation therapy) started 08/25/15  . Malignant neoplasm of upper lobe, left bronchus or lung ([place]06/2014  . Malignant ne[place] of upper lobe, left bronchus or lung ([place]01/2013  . [facility]   Dr McAAngelena Form . Peripheral vascular disease ([place]/14    4.8x4.6 cm infrarenal abdominal aortic fusiform aneurysm, 1.5 cm right common iliac artery aneurysm  . [facility]   Stereotactic radiation therapy: frontoparietal 18gy,posterior frontal lobe 20gy,  . Shortness of breath   . Tobacco dependence    "Quit" 1992, but pt has smoked "on and off" since that time    MEDICATIONS: Current Outpatient Prescriptions on File Prior to Visit  Medication Sig Dispense Refill  . amLODipine (NORVASC) 5 MG tablet TAKE 1 TABLET (5 MG TOTAL) BY MOUTH DAILY. 90 tablet 3  . aspirin EC 81 MG tablet Take 81 mg by mouth daily.    . budesonide-formoterol (SYMBICORT) 160-4.5 MCG/ACT inhaler Inhale 1-2 puffs into the lings every 12 hours. Gargle and Spit after use. 10.2 Inhaler 3  . Glucosamine-Chondroit-Vit C-Mn (GLUCOSAMINE 1500 COMPLEX PO) Take 1,500 mg by mouth daily.     . lMarland KitchenvETIRAcetam (KEPPRA XR) 500 MG 24 hr tablet Take 2 tablet every night 180 tablet 3  . metoprolol (LOPRESSOR) 50 MG tablet Take 1 tablet (50 mg total) by mouth 2 (two) times daily. 180 tablet 3  . Multiple Vitamin (MULTIVITAMIN) tablet Take 1 tablet by mouth every evening.     . pentoxifylline (TRENTAL) 400 MG CR tablet Take 1 tablet (400 mg total) by mouth 3 (three) times daily. 90 tablet  5  . rosuvastatin (CRESTOR) 40 MG tablet TAKE 1 TABLET (40 MG TOTAL) BY MOUTH DAILY. 30 tablet 6  . vitamin E 1000 UNIT capsule Take 1 capsule (1,000 Units total) by mouth daily. 30 capsule 12   No current facility-administered medications on file prior to visit.     ALLERGIES: Allergies  Allergen Reactions  . Fluvirin [Influenza Vac Typ A&B Surf Ant] Nausea And Vomiting and Other (See Comments)    Seizure like activity?  Marland Kitchen Iodinated Diagnostic Agents Hives    1 hive on lt cheek lasting approximately 1 hour on last 2 CT per pt; needs pre meds in future; 50 mg benadryl po 1 hr prior to exam per Dr. Weber Cooks    FAMILY HISTORY: Family History  Problem Relation Age of Onset  .  Hypertension Mother   . Prostate cancer Father     Prostate cancer  . Cancer Father     Brain Tumor  . Lung cancer Sister     NON SMOKER  . Cancer Sister   . Hyperlipidemia Sister   . Hyperlipidemia Brother   . Heart attack Brother   . Hypertension Brother   . Diabetes Neg Hx   . Stroke Neg Hx     SOCIAL HISTORY: Social History   Social History  . Marital status: Married    Spouse name: N/A  . Number of children: N/A  . Years of education: N/A   Occupational History  . Retired Risk analyst    Social History Main Topics  . Smoking status: Current Every Day Smoker    Packs/day: 1.00    Years: 24.00    Types: Cigarettes    Last attempt to quit: 05/28/2013  . Smokeless tobacco: Never Used     Comment: QUIT 15 YEARS AGO  . Alcohol use 9.0 oz/week    3 Glasses of wine, 12 Cans of beer per week     Comment: 1-2 beers a day  . Drug use: No  . Sexual activity: Not on file   Other Topics Concern  . Not on file   Social History Narrative   HEART HEALTHY DIET.     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   MARRIED, 2 children who live in [place].  Smoked on and off since then.   ALCOHOL USE -YES- RED WINE SOCIALLY, couple of beers at night usually.    REVIEW OF SYSTEMS: Constitutional: No fevers, chills, or sweats, no generalized fatigue, change in appetite Eyes: No visual changes, double vision, eye pain Ear, nose and throat: No hearing loss, ear pain, nasal congestion, sore throat Cardiovascular: No chest pain, palpitations Respiratory:  No shortness of breath at rest or with exertion, wheezes GastrointestinaI: No nausea, vomiting, diarrhea, abdominal pain, fecal incontinence Genitourinary:  No dysuria, urinary retention or frequency Musculoskeletal:  No neck pain, +back pain Integumentary: No rash, pruritus, skin lesions Neurological: as above Psychiatric: No depression, insomnia, anxiety Endocrine: No palpitations, fatigue, diaphoresis, mood swings,  change in appetite, change in weight, increased thirst Hematologic/Lymphatic:  No anemia, purpura, petechiae. Allergic/Immunologic: no itchy/runny eyes, nasal congestion, recent allergic reactions, rashes  PHYSICAL EXAM: Vitals:   03/21/16 1523  BP: 134/80  Pulse: 98   General: No acute distress Head:  Normocephalic/atraumatic Neck: supple, no paraspinal tenderness, full range of motion Heart:  Regular rate and rhythm Lungs:  Clear to auscultation bilaterally Back: No paraspinal tenderness Skin/Extremities: No rash, no edema Neurological Exam: alert and oriented to person, place, and time. No  aphasia or dysarthria. Fund of knowledge is appropriate.  Recent and remote memory are intact.  Attention and concentration are normal.    Able to name objects and repeat phrases. Cranial nerves: Pupils equal, round, reactive to light. Extraocular movements intact with no nystagmus. Visual fields full. Facial sensation intact. No facial asymmetry. Tongue, uvula, palate midline.  Motor: Bulk and tone normal, muscle strength 5/5 throughout with no pronator drift, orbits around the left arm.  Sensation to light touch intact.  No extinction to double simultaneous stimulation.  Deep tendon reflexes 2+ throughout, toes downgoing.  Finger to nose testing intact.  Gait slow and cautious, no ataxia, uses cane mostly for balance.   IMPRESSION: This is a pleasant 75 yo RH man with a history of lung cancer s/p lobectomy, radiation, chemotherapy, found to have 2 right frontal brain metastases after he had a convulsive seizure on 08/04/15. Routine EEG normal. He started having focal motor seizures affecting the face last month. None since increasing Keppra XR dose to '1000mg'$  qhs. No side effects on higher dose. He continues to have some fatigue and mild left-sided weakness, continue using cane for balance. He is aware of [place] driving laws to stop driving after a seizure, until 6 months seizure-free. He will follow-up after 3  months and knows to call for any problems.  Thank you for allowing me to participate in his care.  Please do not hesitate to call for any questions or concerns.  The duration of this appointment visit was 15 minutes of face-to-face time with the patient.  Greater than 50% of this time was spent in counseling, explanation of diagnosis, planning of further management, and coordination of care.   Ellouise Newer, M.D.   CC: Dr. Anitra Lauth, Dr. Sherwood Gambler, Dr. Julien Nordmann

## 2016-03-21 NOTE — Patient Instructions (Signed)
1. Continue Keppra XR '500mg'$  2 tablets daily 2. Follow-up in 3 months, call for any changes  Seizure Precautions: 1. If medication has been prescribed for you to prevent seizures, take it exactly as directed.  Do not stop taking the medicine without talking to your doctor first, even if you have not had a seizure in a long time.   2. Avoid activities in which a seizure would cause danger to yourself or to others.  Don't operate dangerous machinery, swim alone, or climb in high or dangerous places, such as on ladders, roofs, or girders.  Do not drive unless your doctor says you may.  3. If you have any warning that you may have a seizure, lay down in a safe place where you can't hurt yourself.    4.  No driving for 6 months from last seizure, as per [facility].   Please refer to the following link on the [employer] website for more information: http://[email or web address].org/[email or web address]/driving/drivingu.cfm   5.  Maintain good sleep hygiene. Avoid alcohol.  6.  Contact your doctor if you have any problems that may be related to the medicine you are taking.  7.  Call 911 and bring the patient back to the ED if:        A.  The seizure lasts longer than 5 minutes.       B.  The patient doesn't awaken shortly after the seizure  C.  The patient has new problems such as difficulty seeing, speaking or moving  D.  The patient was injured during the seizure  E.  The patient has a temperature over 102 F (39C)  F.  The patient vomited and now is having trouble breathing

## 2016-03-29 ENCOUNTER — Encounter: Payer: Self-pay | Admitting: Family Medicine

## 2016-04-03 ENCOUNTER — Ambulatory Visit (INDEPENDENT_AMBULATORY_CARE_PROVIDER_SITE_OTHER): Payer: Medicare Other | Admitting: Family Medicine

## 2016-04-03 ENCOUNTER — Encounter: Payer: Self-pay | Admitting: Family Medicine

## 2016-04-03 VITALS — BP 139/78 | HR 71 | Temp 97.8°F | Resp 16 | Ht 63.0 in | Wt 146.8 lb

## 2016-04-03 DIAGNOSIS — G40909 Epilepsy, unspecified, not intractable, without status epilepticus: Secondary | ICD-10-CM

## 2016-04-03 DIAGNOSIS — Z0000 Encounter for general adult medical examination without abnormal findings: Secondary | ICD-10-CM | POA: Diagnosis not present

## 2016-04-03 NOTE — Progress Notes (Signed)
The patient is here for annual Medicare wellness examination and management of other chronic and acute problems. Other problems discussed today: none.  He sees Dr. Sherwood Gambler after his MRI brain which is on 04/17/16.   AWV DATA The risk factors are reflected in the social history.  The roster of all physicians providing medical care to patient is listed in the Snapshot section of the chart.  Activities of daily living:  The patient is 100% independent in all ADLs: dressing, toileting, feeding as well as independent mobility.  Home safety : The patient has smoke detectors in the home. They wear seatbelts.  No firearms at home ( firearms are present in the home, kept in a safe fashion). There is no violence in the home.   There is no risks for hepatitis, STDs or HIV. There is no history of blood transfusion. They have no travel history to infectious disease endemic areas of the world.  The patient has not seen their dentist in the last six month, but is due for cleaning. They have not seen their eye doctor in the last year. They deny any hearing difficulty and have not had audiologic testing in the last year.  He has hearing aid but doesn't wear it b/c "it's a pain in my neck".  They do not  have excessive sun exposure. Discussed the need for sun protection: hats, long sleeves and use of sunscreen if there is significant sun exposure.   Diet: the importance of a healthy diet is discussed. They do have a healthy.  The patient does not have a regular exercise program:  The benefits of regular aerobic exercise were discussed.  Depression screen: there are no signs or vegative symptoms of depression- irritability, change in appetite, anhedonia, sadness/tearfullness.  Cognitive assessment: the patient manages all their financial and personal affairs and is actively engaged. They could relate day,date,year and events; recalled 3/3 objects at 3 minutes; performed clock-face test normally.  Reviewed  advance directives with pt today.  He has designated a [facility] POA but is still filling out living will.  The following portions of the patient's history were reviewed and updated as appropriate: allergies, current medications, past family history, past medical history,  past surgical history, past social history  and problem list.  Vision, hearing, body mass index were assessed and reviewed. Body mass index is 26 kg/m.   During the course of the visit the patient was educated and counseled about appropriate screening and preventive services including :  Annual wellness visit : doing currently. diabetes screening: he has been screened for this this year. colorectal cancer screening: n/a in this 76 y/o with stage 4 lung ca recommended immunizations (influenza, pneumococcal, Hep B): due for flu vaccine in Fall, and is UTD on pneumonia vaccines. Bone mass measurement: n/a Counseling to prevent tobacco use: n/a Depression screening: done today Glaucoma screening: via his eye MD Hepatitis C virus screening: doesn't qualify HIV virus screening: doesn't qualify Lung cancer screening: pt currently carries dx of stage IV lung cancer. Medical nutrition therapy: n/a Prostate cancer screening: n/a in this 75 y/o pt with stage 4 lung ca Screening mammography: n/a Screening pap tests, pelvic exam, and clinical breast exam: n/a Ultrasound screening for AAA: he gets followed already for dx of known AAA  A written plan of action regarding the above screening and preventative services was given to the patient today.  An After Visit Summary was printed and given to the patient.  Signed:  Crissie Sickles, MD  04/03/2016  

## 2016-04-03 NOTE — Progress Notes (Signed)
Pre visit review using our clinic review tool, if applicable. No additional management support is needed unless otherwise documented below in the visit note. 

## 2016-04-14 ENCOUNTER — Encounter: Payer: Self-pay | Admitting: Internal Medicine

## 2016-04-17 ENCOUNTER — Ambulatory Visit
Admission: RE | Admit: 2016-04-17 | Discharge: 2016-04-17 | Disposition: A | Discharge status: Home / Self Care | Payer: Medicare Other | Source: Ambulatory Visit | Attending: Radiation Oncology | Admitting: Radiation Oncology

## 2016-04-17 DIAGNOSIS — C7931 Secondary malignant neoplasm of brain: Secondary | ICD-10-CM

## 2016-04-17 DIAGNOSIS — C7949 Secondary malignant neoplasm of other parts of nervous system: Secondary | ICD-10-CM

## 2016-04-17 MED ORDER — GADOBENATE DIMEGLUMINE 529 MG/ML IV SOLN
13.0000 mL | Freq: Once | INTRAVENOUS | Status: AC | PRN
  Administered 2016-04-17: 13 mL via INTRAVENOUS

## 2016-04-19 ENCOUNTER — Other Ambulatory Visit: Payer: Self-pay | Admitting: Internal Medicine

## 2016-04-19 ENCOUNTER — Encounter: Payer: Self-pay | Admitting: Medical Oncology

## 2016-04-19 ENCOUNTER — Encounter: Payer: Self-pay | Admitting: Internal Medicine

## 2016-04-19 DIAGNOSIS — C3412 Malignant neoplasm of upper lobe, left bronchus or lung: Secondary | ICD-10-CM

## 2016-04-19 DIAGNOSIS — M545 Low back pain: Secondary | ICD-10-CM

## 2016-04-19 DIAGNOSIS — M549 Dorsalgia, unspecified: Secondary | ICD-10-CM

## 2016-04-19 HISTORY — DX: Dorsalgia, unspecified: M54.9

## 2016-04-19 NOTE — Progress Notes (Signed)
refe

## 2016-05-01 ENCOUNTER — Encounter: Payer: Self-pay | Admitting: Radiation Oncology

## 2016-05-01 ENCOUNTER — Encounter: Payer: Self-pay | Admitting: Internal Medicine

## 2016-05-02 ENCOUNTER — Ambulatory Visit: Payer: Medicare Other | Admitting: Neurology

## 2016-05-04 ENCOUNTER — Other Ambulatory Visit: Payer: Self-pay | Admitting: Radiation Therapy

## 2016-05-04 DIAGNOSIS — C7931 Secondary malignant neoplasm of brain: Secondary | ICD-10-CM

## 2016-05-04 DIAGNOSIS — C7949 Secondary malignant neoplasm of other parts of nervous system: Secondary | ICD-10-CM

## 2016-05-18 ENCOUNTER — Other Ambulatory Visit: Payer: Self-pay | Admitting: Medical Oncology

## 2016-05-19 ENCOUNTER — Encounter: Payer: Self-pay | Admitting: Medical Oncology

## 2016-05-19 ENCOUNTER — Other Ambulatory Visit: Payer: Self-pay

## 2016-05-19 ENCOUNTER — Other Ambulatory Visit: Payer: Self-pay | Admitting: Internal Medicine

## 2016-05-19 MED ORDER — ROSUVASTATIN CALCIUM 40 MG PO TABS: ORAL_TABLET | ORAL | 6 refills | Status: DC

## 2016-05-19 NOTE — Telephone Encounter (Signed)
Refill sent.

## 2016-05-22 ENCOUNTER — Other Ambulatory Visit: Payer: Self-pay | Admitting: Internal Medicine

## 2016-05-22 DIAGNOSIS — C3412 Malignant neoplasm of upper lobe, left bronchus or lung: Secondary | ICD-10-CM

## 2016-05-26 ENCOUNTER — Other Ambulatory Visit (HOSPITAL_BASED_OUTPATIENT_CLINIC_OR_DEPARTMENT_OTHER): Payer: Medicare Other

## 2016-05-26 ENCOUNTER — Ambulatory Visit (HOSPITAL_COMMUNITY)
Admission: RE | Admit: 2016-05-26 | Discharge: 2016-05-26 | Disposition: A | Discharge status: Home / Self Care | Payer: Medicare Other | Source: Ambulatory Visit | Attending: Internal Medicine | Admitting: Internal Medicine

## 2016-05-26 ENCOUNTER — Encounter (HOSPITAL_COMMUNITY): Payer: Self-pay

## 2016-05-26 DIAGNOSIS — C7931 Secondary malignant neoplasm of brain: Secondary | ICD-10-CM | POA: Diagnosis not present

## 2016-05-26 DIAGNOSIS — K8020 Calculus of gallbladder without cholecystitis without obstruction: Secondary | ICD-10-CM | POA: Insufficient documentation

## 2016-05-26 DIAGNOSIS — K769 Liver disease, unspecified: Secondary | ICD-10-CM | POA: Insufficient documentation

## 2016-05-26 DIAGNOSIS — I714 Abdominal aortic aneurysm, without rupture: Secondary | ICD-10-CM | POA: Insufficient documentation

## 2016-05-26 DIAGNOSIS — I700 Atherosclerosis of aorta: Secondary | ICD-10-CM | POA: Insufficient documentation

## 2016-05-26 DIAGNOSIS — K5730 Diverticulosis of large intestine without perforation or abscess without bleeding: Secondary | ICD-10-CM | POA: Insufficient documentation

## 2016-05-26 DIAGNOSIS — J439 Emphysema, unspecified: Secondary | ICD-10-CM | POA: Insufficient documentation

## 2016-05-26 DIAGNOSIS — C3412 Malignant neoplasm of upper lobe, left bronchus or lung: Secondary | ICD-10-CM

## 2016-05-26 DIAGNOSIS — R911 Solitary pulmonary nodule: Secondary | ICD-10-CM | POA: Diagnosis not present

## 2016-05-26 DIAGNOSIS — I2510 Atherosclerotic heart disease of native coronary artery without angina pectoris: Secondary | ICD-10-CM | POA: Insufficient documentation

## 2016-05-26 LAB — COMPREHENSIVE METABOLIC PANEL
ALT: 19 U/L (ref 0–55)
AST: 19 U/L (ref 5–34)
Albumin: 4.1 g/dL (ref 3.5–5.0)
Alkaline Phosphatase: 74 U/L (ref 40–150)
Anion Gap: 10 mEq/L (ref 3–11)
BUN: 10.6 mg/dL (ref 7.0–26.0)
CO2: 27 mEq/L (ref 22–29)
Calcium: 10.3 mg/dL (ref 8.4–10.4)
Chloride: 106 mEq/L (ref 98–109)
Creatinine: 0.9 mg/dL (ref 0.7–1.3)
EGFR: 84 mL/min/{1.73_m2} — ABNORMAL LOW (ref >90)
Glucose: 111 mg/dl (ref 70–140)
Potassium: 5.2 mEq/L — ABNORMAL HIGH (ref 3.5–5.1)
Sodium: 143 mEq/L (ref 136–145)
Total Bilirubin: 0.45 mg/dL (ref 0.20–1.20)
Total Protein: 8 g/dL (ref 6.4–8.3)

## 2016-05-26 LAB — CBC WITH DIFFERENTIAL/PLATELET
BASO%: 0.6 % (ref 0.0–2.0)
Basophils Absolute: 0.1 10*3/uL (ref 0.0–0.1)
EOS%: 1.3 % (ref 0.0–7.0)
Eosinophils Absolute: 0.1 10*3/uL (ref 0.0–0.5)
HCT: 47.4 % (ref 38.4–49.9)
HGB: 15.7 g/dL (ref 13.0–17.1)
LYMPH%: 15.5 % (ref 14.0–49.0)
MCH: 28.4 pg (ref 27.2–33.4)
MCHC: 33.2 g/dL (ref 32.0–36.0)
MCV: 85.6 fL (ref 79.3–98.0)
MONO#: 0.6 10*3/uL (ref 0.1–0.9)
MONO%: 6 % (ref 0.0–14.0)
NEUT#: 7.5 10*3/uL — ABNORMAL HIGH (ref 1.5–6.5)
NEUT%: 76.6 % — ABNORMAL HIGH (ref 39.0–75.0)
Platelets: 219 10*3/uL (ref 140–400)
RBC: 5.53 10*6/uL (ref 4.20–5.82)
RDW: 14.8 % — ABNORMAL HIGH (ref 11.0–14.6)
WBC: 9.8 10*3/uL (ref 4.0–10.3)
lymph#: 1.5 10*3/uL (ref 0.9–3.3)

## 2016-05-26 MED ORDER — IOPAMIDOL (ISOVUE-300) INJECTION 61%
100.0000 mL | Freq: Once | INTRAVENOUS | Status: AC | PRN
  Administered 2016-05-26: 100 mL via INTRAVENOUS

## 2016-05-30 ENCOUNTER — Encounter: Payer: Self-pay | Admitting: *Deleted

## 2016-05-30 ENCOUNTER — Telehealth: Payer: Self-pay | Admitting: Internal Medicine

## 2016-05-30 ENCOUNTER — Encounter: Payer: Self-pay | Admitting: Radiation Oncology

## 2016-05-30 NOTE — Telephone Encounter (Signed)
MM PAL - MOVED 10/12 F/U TO 10/11. LEFT MESSAGE FOR PATIENT ON BOTH HOME AND CELL NUMBERS.

## 2016-05-30 NOTE — Progress Notes (Signed)
[place] with Shona Simpson, P.A. For Trental 400 mg. Tid with 6 refills renewal  Called [facility] at [facility], [place].  Mabel Mr. Carlas Vandyne to let him know he could pick up his prescription later this evening to call his pharmacy to make sure the prescription is ready.

## 2016-05-31 ENCOUNTER — Encounter: Payer: Self-pay | Admitting: Internal Medicine

## 2016-05-31 ENCOUNTER — Ambulatory Visit (HOSPITAL_BASED_OUTPATIENT_CLINIC_OR_DEPARTMENT_OTHER): Payer: Medicare Other | Admitting: Internal Medicine

## 2016-05-31 VITALS — BP 132/81 | HR 86 | Temp 98.2°F | Resp 17 | Ht 63.0 in | Wt 142.7 lb

## 2016-05-31 DIAGNOSIS — E875 Hyperkalemia: Secondary | ICD-10-CM | POA: Diagnosis not present

## 2016-05-31 DIAGNOSIS — M545 Low back pain: Secondary | ICD-10-CM | POA: Diagnosis not present

## 2016-05-31 DIAGNOSIS — Z9889 Other specified postprocedural states: Secondary | ICD-10-CM

## 2016-05-31 DIAGNOSIS — C7931 Secondary malignant neoplasm of brain: Secondary | ICD-10-CM

## 2016-05-31 DIAGNOSIS — C3412 Malignant neoplasm of upper lobe, left bronchus or lung: Secondary | ICD-10-CM | POA: Diagnosis not present

## 2016-05-31 NOTE — Progress Notes (Signed)
Jordan Mccullough  [phone] Fax:([phone]) [phone] OFFICE PROGRESS NOTE  Tammi Sou, MD [address] [place] [address]  DIAGNOSIS: Metastatic non-small cell lung cancer initially diagnosed as Stage IIIA (T2a., N2, M0) non-small cell lung cancer adenocarcinoma with negative EGFR mutation and negative ALK gene translocation involving the left upper lobe middle mediastinal lymphadenopathy diagnosed in August of 2014. He had metastatic disease to the brain diagnosed in December 2016.  Lung cancer   Primary site: Lung (Left)   Staging method: AJCC 7th Edition   Clinical: Stage IIIA (T2a, N2, M0) signed by [name], MD on 06/19/2013  5:06 PM   Summary: Stage IIIA (T2a, N2, M0).  Molecular biomarkers: Positive for [id number]*7, [id number] ATM splice site [id number]>KP TP53 E271K, SETD2 N138f17 and SPOP E47K                                          Negative for: RET, ALK, BRAF, KRAS, ERBB2, MET and EGFR.  PRIOR THERAPY:  1) Status post left upper lobectomy with mediastinal lymph node dissection on 05/29/2013. 2) Adjuvant chemotherapy with cisplatin at 75 mg per meter squared and Alimta at 500 mg per meter squared given every 3 weeks for a total of 4 cycles. First cycle given on 07/10/2013 3) curative radiotherapy to the mediastinum under the care of Dr. [name] on 12/12/2013. 4) status post stereotactic radiotherapy to the metastatic brain lesion on 08/25/2015 under the care of Dr. [name] CURRENT THERAPY: Observation.  DISEASE STAGE: Lung cancer   Primary site: Lung (Left)   Staging method: AJCC 7th Edition   Clinical: Stage IIIA (T2a, N2, M0) signed by [name] on 06/19/2013  5:06 PM   Summary: Stage IIIA (T2a, N2, M0)  CHEMOTHERAPY INTENT: Control/curative  CURRENT # OF CHEMOTHERAPY CYCLES: 0  CURRENT ANTIEMETICS: Aloxi, dexamethasone, Emend, Compazine  CURRENT SMOKING STATUS: Former smoker  ORAL  CHEMOTHERAPY AND CONSENT: n/a  CURRENT BISPHOSPHONATES USE: none  PAIN MANAGEMENT: 0/10  NARCOTICS INDUCED CONSTIPATION: None  LIVING WILL AND CODE STATUS: Full code   INTERVAL HISTORY: Jordan HOLLEMAN89.o. male returns for 3 month followup visit accompanied by his wife and daughter. The patient is feeling fine today with no specific complaints except for fatigue low back pain secondary to compression fractures of L4 and L5. He was referred to orthopedic surgery for evaluation but has not received an appointment yet. He denied having any significant nausea or vomiting, no fever or chills. The patient denied having any significant chest pain, shortness of breath, or hemoptysis. He had repeat CT scan of the chest, abdomen and pelvis performed recently and he is here for evaluation and discussion of his scan results.  MEDICAL HISTORY: Past Medical History:  Diagnosis Date  . AAA (abdominal aortic aneurysm) ([facility]  . Asymptomatic cholelithiasis 07/2015   Incidental finding on PET CT  . Back pain 04/19/2016  . Coronary artery disease    MI 1992, S/P  PTCA; negative stress test in November 2011 with no ischemia.   . Diverticulosis   . Hearing loss    Bilateral   . History of radiation therapy 11/10/13-12/12/13   lung,50Gy/2541f. Hyperlipidemia   . Hypertension   . Lung cancer ([facility]/2014   non-small cell;  L upper lobectomy with mediastinal LN dissection, chemo, and radiation in 2014/2015. Remission until pt  had questionable seizure 07/2015--MRI showed brain mets; palliative brain rad (stereotactic radiation therapy) started 08/25/15.  CT C/A/P clear 02/2016.  Plan per onc is repeat CT C/A/P 3 mo.  . Malignant neoplasm of upper lobe, left bronchus or lung ([place]) 10/29/2013  . Malignant neoplasm of upper lobe, left bronchus or lung ([place]) 06/26/2013  . Myocardial infarction 1992   Dr Angelena Form    . Peripheral vascular disease ([place]) 1/14   4.8x4.6 cm infrarenal abdominal aortic fusiform  aneurysm, 1.5 cm right common iliac artery aneurysm  . S/P radiation therapy [facility] 08/25/15   Stereotactic radiation therapy: frontoparietal 18gy,posterior frontal lobe 20gy,  . Seizure disorder ([place]) 2016/2017   as sequela of brain mets;  Grand mal seizure 07/2015, then got on keppra and was seizure-free until focal motor seizures of L side of face began 02/2016- these responded well to up-titration of keppra.  . Shortness of breath   . Tobacco dependence    "Quit" 1992, but pt has smoked "on and off" since that time    ALLERGIES:  is allergic to fluvirin [influenza vac typ a&b surf ant] and iodinated diagnostic agents.  MEDICATIONS:  Current Outpatient Prescriptions  Medication Sig Dispense Refill  . amLODipine (NORVASC) 5 MG tablet TAKE 1 TABLET (5 MG TOTAL) BY MOUTH DAILY. 90 tablet 3  . aspirin EC 81 MG tablet Take 81 mg by mouth daily.    . budesonide-formoterol (SYMBICORT) 160-4.5 MCG/ACT inhaler Inhale 1-2 puffs into the lings every 12 hours. Gargle and Spit after use. 10.2 Inhaler 3  . Glucosamine-Chondroit-Vit C-Mn (GLUCOSAMINE 1500 COMPLEX PO) Take 1,500 mg by mouth daily.     Marland Kitchen levETIRAcetam (KEPPRA XR) 500 MG 24 hr tablet Take 2 tablet every night 180 tablet 3  . metoprolol (LOPRESSOR) 50 MG tablet Take 1 tablet (50 mg total) by mouth 2 (two) times daily. 180 tablet 3  . Multiple Vitamin (MULTIVITAMIN) tablet Take 1 tablet by mouth every evening.     . pentoxifylline (TRENTAL) 400 MG CR tablet Take 1 tablet (400 mg total) by mouth 3 (three) times daily. 90 tablet 5  . rosuvastatin (CRESTOR) 40 MG tablet TAKE 1 TABLET (40 MG TOTAL) BY MOUTH DAILY. 30 tablet 6  . vitamin E 1000 UNIT capsule Take 1 capsule (1,000 Units total) by mouth daily. 30 capsule 12   No current facility-administered medications for this visit.     SURGICAL HISTORY:  Past Surgical History:  Procedure Laterality Date  . ABDOMINAL AORTIC ENDOVASCULAR STENT GRAFT N/A 05/07/2014   Procedure: ABDOMINAL AORTIC  ENDOVASCULAR STENT GRAFT;  Surgeon: Serafina Mitchell, MD;  Location: [facility];  Service: Vascular;  Laterality: N/A;  . Carotid duplex dopplers  07/2015   1-39% on R, no signif dz noted on L  . COLONOSCOPY W/ POLYPECTOMY  2003    negative 2010,due 2020; Dr Olevia Perches  . CORONARY ANGIOPLASTY     no stents  . CYSTOSCOPY/RETROGRADE/URETEROSCOPY Bilateral 10/09/2012   Procedure: BILATERAL RETROGRADE bladder and urethral BIOPSY ;  Surgeon: Molli Hazard, MD;  Location: [facility];  Service: Urology;  Laterality: Bilateral;  BILATERAL RETROGRADE   . EEG  08/05/15   Pt placed on keppra just prior to this test due to having ? seizure (MRI showed brain mets)  . HERNIA REPAIR Bilateral    Inguinal  . INGUINAL HERIIORRHAPHY BILATERALLY    . MEDIASTINOSCOPY N/A 05/01/2013   Procedure: MEDIASTINOSCOPY;  Surgeon: Gaye Pollack, MD;  Location: [facility];  Service: Thoracic;  Laterality: N/A;  .  PFTs  04/2013   Minimal obstructive airway disease  . PILONIDAL CYST EXCISION    . PROSTATE BIOPSY N/A 10/09/2012   Procedure: PROSTATIC URETHRAL BIOPSY;  Surgeon: Molli Hazard, MD;  Location: [facility];  Service: Urology;  Laterality: N/A;  PROSTATIC URETHRAL BIOPSY   . PTCA  1992  . ROTATOR CUFF REPAIR     Bilateral  . THOROCOTOMY WITH LOBECTOMY Left 05/29/2013   Procedure: LEFT THOROCOTOMY WITH LEFT UPPER LOBE LOBECTOMY;  Surgeon: Gaye Pollack, MD;  Location: [facility];  Service: Thoracic;  Laterality: Left;  Marland Kitchen VIDEO BRONCHOSCOPY N/A 05/01/2013   Procedure: VIDEO BRONCHOSCOPY;  Surgeon: Gaye Pollack, MD;  Location: [facility];  Service: Thoracic;  Laterality: N/A;    REVIEW OF SYSTEMS:  A comprehensive review of systems was negative except for: Constitutional: positive for fatigue Musculoskeletal: positive for back pain   PHYSICAL EXAMINATION: General appearance: alert, cooperative, appears stated age and no distress Head: Normocephalic, without obvious abnormality, atraumatic Neck: no adenopathy, no carotid  bruit, no JVD, supple, symmetrical, trachea midline and thyroid not enlarged, symmetric, no tenderness/mass/nodules Lymph nodes: Cervical, supraclavicular, and axillary nodes normal. Resp: clear to auscultation bilaterally Cardio: S1, S2 normal and Occasional missed/skipped beat(s) GI: soft, non-tender; bowel sounds normal; no masses,  no organomegaly Extremities: extremities normal, atraumatic, no cyanosis or edema Neurologic: Alert and oriented X 3, normal strength and tone. Normal symmetric reflexes. Normal coordination and gait  ECOG PERFORMANCE STATUS: 1 - Symptomatic but completely ambulatory  Blood pressure 132/81, pulse 86, temperature 98.2 F (36.8 C), resp. rate 17, height 5' 3"  (1.6 m), weight 142 lb 11.2 oz (64.7 kg), SpO2 98 %.  LABORATORY DATA: Lab Results  Component Value Date   WBC 9.8 05/26/2016   HGB 15.7 05/26/2016   HCT 47.4 05/26/2016   MCV 85.6 05/26/2016   PLT 219 05/26/2016      Chemistry      Component Value Date/Time   NA 143 05/26/2016 0952   K 5.2 (H) 05/26/2016 0952   CL 101 09/16/2015 0927   CO2 27 05/26/2016 0952   BUN 10.6 05/26/2016 0952   CREATININE 0.9 05/26/2016 0952      Component Value Date/Time   CALCIUM 10.3 05/26/2016 0952   ALKPHOS 74 05/26/2016 0952   AST 19 05/26/2016 0952   ALT 19 05/26/2016 0952   BILITOT 0.45 05/26/2016 0952       RADIOGRAPHIC STUDIES: Ct Chest W Contrast  Result Date: 05/26/2016 CLINICAL DATA:  Left upper lobe lung adenocarcinoma diagnosed August 2014 with metastatic disease to the brain diagnosed December 2016 with history of left upper lobectomy, adjuvant chemotherapy and mediastinal curative radiotherapy. Stereotactic brain radiotherapy. Currently on observation. EXAM: CT CHEST, ABDOMEN, AND PELVIS WITH CONTRAST TECHNIQUE: Multidetector CT imaging of the chest, abdomen and pelvis was performed following the standard protocol during bolus administration of intravenous contrast. CONTRAST:  100 cc  Isovue-300 IV. COMPARISON:  03/14/2016 CT chest, abdomen and pelvis. FINDINGS: CT CHEST FINDINGS Cardiovascular: Normal heart size. Stable trace pericardial effusion/ thickening. Left anterior descending, left circumflex and right coronary atherosclerosis. Atherosclerotic nonaneurysmal thoracic aorta. Normal caliber pulmonary arteries. No central pulmonary emboli. Mediastinum/Nodes: No discrete thyroid nodules. Unremarkable esophagus. No pathologically enlarged axillary, mediastinal or hilar lymph nodes. Lungs/Pleura: No pneumothorax. No pleural effusion. Status post left upper lobectomy. Mild centrilobular and paraseptal emphysema. Stable sharply marginated ground-glass attenuation and reticulation in the parahilar left lower lobe with associated mild distortion consistent with radiation fibrosis. Solitary 4 mm solid medial right  lower lobe pulmonary nodule (series 5/ image 113) is stable back to at least 07/21/2014. No acute consolidative airspace disease, lung masses or new significant pulmonary nodules. Musculoskeletal: No aggressive appearing focal osseous lesions. Mild thoracic spondylosis. CT ABDOMEN PELVIS FINDINGS Hepatobiliary: Normal liver size. Hypodense right liver dome 0.5 cm lesion, too small to characterize, stable back to at least 10/07/2013. No new liver lesions. Calcified 13 mm gallstone within the nondistended gallbladder, with no gallbladder wall thickening or pericholecystic fluid. No biliary ductal dilatation. Pancreas: Normal, with no mass or duct dilation. Spleen: Normal size. No mass. Adrenals/Urinary Tract: Normal adrenals. No hydronephrosis. Simple renal cysts in the left kidney measuring up to the 3.3 cm in the posterior lower left kidney. Subcentimeter hypodense renal cortical lesions scattered throughout both kidneys are too small to characterize and appears stable, suggesting benign renal cysts. Stable borderline mild diffuse bladder wall thickening. Stomach/Bowel: Grossly normal  stomach. Normal caliber small bowel with no small bowel wall thickening. Normal appendix. Mild distal colonic diverticulosis, with no large bowel wall thickening or pericolonic fat stranding. Vascular/Lymphatic: Abdominal aortic atherosclerosis. Infrarenal abdominal aortic aneurysm measuring 5.9 cm in maximum diameter status post aortobi-iliac stent graft repair, not appreciably changed. Tiny foci of hyperdensity at the proximal and left distal margins of the aneurysm sac suggest type 2 endoleak, unchanged. Patent portal, splenic, hepatic and renal veins. No pathologically enlarged lymph nodes in the abdomen or pelvis. Reproductive: Stable moderately enlarged prostate with nonspecific internal prostatic calcifications. Other: No pneumoperitoneum, ascites or focal fluid collection. Stable small fat containing left inguinal hernia. Musculoskeletal: No aggressive appearing focal osseous lesions. Stable chronic moderate L4 and mild L5 compression fractures. Moderate lumbar spondylosis. IMPRESSION: 1. No evidence of metastatic disease in the chest, abdomen or pelvis. Solitary right lower lobe 4 mm pulmonary nodule is stable back to the 07/21/2014 and subcentimeter right liver dome lesion is stable back to 10/07/2013, probably benign. 2. Additional findings include aortic atherosclerosis, three-vessel coronary atherosclerosis, mild emphysema, cholelithiasis, mild distal colonic diverticulosis, stable 5.9 cm infrarenal abdominal aortic aneurysm status post aortobi-iliac stent graft repair with stable findings suggestive of chronic type 2 endoleak, stable borderline mild diffuse bladder wall thickening probably due to chronic bladder outlet obstruction by the enlarged prostate, and small fat containing left inguinal hernia. Electronically Signed   By: Ilona Sorrel M.D.   On: 05/26/2016 14:24   Ct Abdomen Pelvis W Contrast  Result Date: 05/26/2016 CLINICAL DATA:  Left upper lobe lung adenocarcinoma diagnosed August 2014  with metastatic disease to the brain diagnosed December 2016 with history of left upper lobectomy, adjuvant chemotherapy and mediastinal curative radiotherapy. Stereotactic brain radiotherapy. Currently on observation. EXAM: CT CHEST, ABDOMEN, AND PELVIS WITH CONTRAST TECHNIQUE: Multidetector CT imaging of the chest, abdomen and pelvis was performed following the standard protocol during bolus administration of intravenous contrast. CONTRAST:  100 cc Isovue-300 IV. COMPARISON:  03/14/2016 CT chest, abdomen and pelvis. FINDINGS: CT CHEST FINDINGS Cardiovascular: Normal heart size. Stable trace pericardial effusion/ thickening. Left anterior descending, left circumflex and right coronary atherosclerosis. Atherosclerotic nonaneurysmal thoracic aorta. Normal caliber pulmonary arteries. No central pulmonary emboli. Mediastinum/Nodes: No discrete thyroid nodules. Unremarkable esophagus. No pathologically enlarged axillary, mediastinal or hilar lymph nodes. Lungs/Pleura: No pneumothorax. No pleural effusion. Status post left upper lobectomy. Mild centrilobular and paraseptal emphysema. Stable sharply marginated ground-glass attenuation and reticulation in the parahilar left lower lobe with associated mild distortion consistent with radiation fibrosis. Solitary 4 mm solid medial right lower lobe pulmonary nodule (series 5/ image 113) is  stable back to at least 07/21/2014. No acute consolidative airspace disease, lung masses or new significant pulmonary nodules. Musculoskeletal: No aggressive appearing focal osseous lesions. Mild thoracic spondylosis. CT ABDOMEN PELVIS FINDINGS Hepatobiliary: Normal liver size. Hypodense right liver dome 0.5 cm lesion, too small to characterize, stable back to at least 10/07/2013. No new liver lesions. Calcified 13 mm gallstone within the nondistended gallbladder, with no gallbladder wall thickening or pericholecystic fluid. No biliary ductal dilatation. Pancreas: Normal, with no mass or  duct dilation. Spleen: Normal size. No mass. Adrenals/Urinary Tract: Normal adrenals. No hydronephrosis. Simple renal cysts in the left kidney measuring up to the 3.3 cm in the posterior lower left kidney. Subcentimeter hypodense renal cortical lesions scattered throughout both kidneys are too small to characterize and appears stable, suggesting benign renal cysts. Stable borderline mild diffuse bladder wall thickening. Stomach/Bowel: Grossly normal stomach. Normal caliber small bowel with no small bowel wall thickening. Normal appendix. Mild distal colonic diverticulosis, with no large bowel wall thickening or pericolonic fat stranding. Vascular/Lymphatic: Abdominal aortic atherosclerosis. Infrarenal abdominal aortic aneurysm measuring 5.9 cm in maximum diameter status post aortobi-iliac stent graft repair, not appreciably changed. Tiny foci of hyperdensity at the proximal and left distal margins of the aneurysm sac suggest type 2 endoleak, unchanged. Patent portal, splenic, hepatic and renal veins. No pathologically enlarged lymph nodes in the abdomen or pelvis. Reproductive: Stable moderately enlarged prostate with nonspecific internal prostatic calcifications. Other: No pneumoperitoneum, ascites or focal fluid collection. Stable small fat containing left inguinal hernia. Musculoskeletal: No aggressive appearing focal osseous lesions. Stable chronic moderate L4 and mild L5 compression fractures. Moderate lumbar spondylosis. IMPRESSION: 1. No evidence of metastatic disease in the chest, abdomen or pelvis. Solitary right lower lobe 4 mm pulmonary nodule is stable back to the 07/21/2014 and subcentimeter right liver dome lesion is stable back to 10/07/2013, probably benign. 2. Additional findings include aortic atherosclerosis, three-vessel coronary atherosclerosis, mild emphysema, cholelithiasis, mild distal colonic diverticulosis, stable 5.9 cm infrarenal abdominal aortic aneurysm status post aortobi-iliac stent  graft repair with stable findings suggestive of chronic type 2 endoleak, stable borderline mild diffuse bladder wall thickening probably due to chronic bladder outlet obstruction by the enlarged prostate, and small fat containing left inguinal hernia. Electronically Signed   By: Ilona Sorrel M.D.   On: 05/26/2016 14:24   ASSESSMENT/PLAN: This is a very pleasant 75 year old Caucasian male recently diagnosed with metastatic non-small cell lung cancer initially diagnosed as stage IIIA (T2a., N2, M0) non-small cell lung cancer, adenocarcinoma involving the left upper lobe and mediastinal lymphadenopathy diagnosed in August of 2014. He is status post left upper lobectomy with mediastinal lymph node dissection on 05/29/2013. He is status post 4 cycles of adjuvant chemotherapy with cisplatin and Alimta. He was also found to have metastatic brain lesion and underwent stereotactic radiotherapy in January 2017. The patient is currently on observation and the recent CT scan of the chest, abdomen and pelvis showed no evidence for disease recurrence or progression. I discussed the scan results with the patient and his wife today. I recommended for him to continue on observation with repeat CT scan of the chest, abdomen and pelvis in 6 months for reevaluation of his disease. For the metastatic brain lesion, he will continue his routine follow-up visit and evaluation by Dr. Lisbeth Renshaw and Dr. Sherwood Gambler. For the hyperkalemia, I recommended for the patient to decrease the potassium-rich diet. He was advised to call immediately if he has any concerning symptoms in the interval. All questions were answered. The  patient knows to call the clinic with any problems, questions or concerns. We can certainly see the patient much sooner if necessary.  Disclaimer: This note was dictated with voice recognition software. Similar sounding words can inadvertently be transcribed and may not be corrected upon review.

## 2016-06-01 ENCOUNTER — Ambulatory Visit: Payer: Medicare Other | Admitting: Internal Medicine

## 2016-06-03 ENCOUNTER — Encounter: Payer: Self-pay | Admitting: Family Medicine

## 2016-06-04 ENCOUNTER — Encounter: Payer: Self-pay | Admitting: Surgery

## 2016-06-09 ENCOUNTER — Encounter: Payer: Self-pay | Admitting: Surgery

## 2016-06-12 ENCOUNTER — Ambulatory Visit (INDEPENDENT_AMBULATORY_CARE_PROVIDER_SITE_OTHER): Payer: Medicare Other

## 2016-06-12 DIAGNOSIS — Z23 Encounter for immunization: Secondary | ICD-10-CM | POA: Diagnosis not present

## 2016-06-13 ENCOUNTER — Telehealth: Payer: Self-pay | Admitting: Surgery

## 2016-06-13 NOTE — Telephone Encounter (Signed)
-----   Message from Mena Goes, RN sent at 06/12/2016  1:50 PM EDT ----- Regarding: RE: ct instead of evar? Per Dr. Trula Slade, the CT is fine for the appt, No EVAR scan needed.  ----- Message ----- From: Georgiann Mccoy Sent: 06/12/2016  12:45 PM To: Mena Goes, RN Subject: ct instead of evar?                            Hi Kay,  This pt had a CT Abd/Pel and CT Chest w/ contrast on 05/26/16. They want to know if we can use those images instead of them coming in here for an EVAR?  Thank you, Selinda Eon

## 2016-06-13 NOTE — Telephone Encounter (Signed)
Cxl'd EVAR on 07/03/16. Lm on hm# to inform pt.

## 2016-06-22 ENCOUNTER — Telehealth: Payer: Self-pay | Admitting: General Practice

## 2016-06-22 NOTE — Telephone Encounter (Signed)
Spoke with Sung Amabile regarding appt for April 2018.

## 2016-07-03 ENCOUNTER — Ambulatory Visit: Payer: Medicare Other | Admitting: Surgery

## 2016-07-03 ENCOUNTER — Other Ambulatory Visit (HOSPITAL_COMMUNITY): Payer: Medicare Other

## 2016-07-04 ENCOUNTER — Ambulatory Visit: Payer: Medicare Other | Admitting: Neurology

## 2016-07-05 ENCOUNTER — Encounter: Payer: Self-pay | Admitting: Surgery

## 2016-07-07 ENCOUNTER — Other Ambulatory Visit: Payer: Self-pay | Admitting: Family Medicine

## 2016-07-17 ENCOUNTER — Ambulatory Visit (INDEPENDENT_AMBULATORY_CARE_PROVIDER_SITE_OTHER): Payer: Medicare Other | Admitting: Surgery

## 2016-07-17 ENCOUNTER — Encounter: Payer: Self-pay | Admitting: Surgery

## 2016-07-17 VITALS — BP 136/89 | HR 80 | Temp 98.5°F | Resp 16 | Ht 64.0 in | Wt 147.0 lb

## 2016-07-17 DIAGNOSIS — I714 Abdominal aortic aneurysm, without rupture, unspecified: Secondary | ICD-10-CM

## 2016-07-17 NOTE — Progress Notes (Signed)
Vascular and Vein Specialist of [place]  Patient name: Jordan Mccullough MRN: [id number] DOB: 10/29/1940 Sex: male  REASON FOR VISIT: follow up  HPI: Jordan Mccullough is a 75 y.o. male who returns today for follow up.  He is status post endovascular aneurysm repair on 05/07/2014. 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  He has left-sided weakness from his brain met which has been treated.  Past Medical History:  Diagnosis Date  . AAA (abdominal aortic aneurysm) ([place])   . Asymptomatic cholelithiasis 07/2015   Incidental finding on PET CT  . Back pain 04/19/2016  . Coronary artery disease    MI 1992, S/P  PTCA; negative stress test in November 2011 with no ischemia.   . Diverticulosis   . Hearing loss    Bilateral   . History of radiation therapy 11/10/13-12/12/13   lung,50Gy/83f  . Hyperlipidemia   . Hypertension   . Lung cancer ([place] 05[place]   non-small cell;  L upper lobectomy with mediastinal LN dissection, chemo, and radiation in 2014/2015. Remission until pt had questionable seizure 07/2015--MRI showed brain mets; palliative brain rad (stereotactic radiation therapy) started 08/25/15.  CT C/A/P clear 02/2016 and 05/2016.  Plan per onc is to repeat in 6 mo.  . Malignant neoplasm of upper lobe, left bronchus or lung ([facility] 10/29/2013  . Malignant neoplasm of upper lobe, left bronchus or lung ([facility] 06/26/2013  . Myocardial infarction 1992   Dr MAngelena Form   . Peripheral vascular disease ([facility] 1/14   4.8x4.6 cm infrarenal abdominal aortic fusiform aneurysm, 1.5 cm right common iliac artery aneurysm  . [facility]   Stereotactic radiation therapy: frontoparietal 18gy,posterior frontal lobe 20gy,  . Seizure disorder ([facility] 2016/2017   as sequela of brain mets;  Grand mal seizure 07/2015, then got on keppra and was seizure-free until focal motor seizures of L side of face began 02/2016- these responded  well to up-titration of keppra.  . Shortness of breath   . Tobacco dependence    "Quit" 1992, but pt has smoked "on and off" since that time    Family History  Problem Relation Age of Onset  . Hypertension Mother   . Prostate cancer Father     Prostate cancer  . Cancer Father     Brain Tumor  . Lung cancer Sister     NON SMOKER  . Cancer Sister   . Hyperlipidemia Sister   . Hyperlipidemia Brother   . Heart attack Brother   . Hypertension Brother   . Diabetes Neg Hx   . Stroke Neg Hx     SOCIAL HISTORY: Social History  Substance Use Topics  . Smoking status: Current Every Day Smoker    Packs/day: 1.00    Years: 24.00    Types: Cigarettes    Last attempt to quit: 05/28/2013  . Smokeless tobacco: Never Used     Comment: QUIT 15 YEARS AGO  . Alcohol use 9.0 oz/week    3 Glasses of wine, 12 Cans of beer per week     Comment: 1-2 beers a day    Allergies  Allergen Reactions  . Fluvirin [Influenza Vac Typ A&B Surf Ant] Nausea And Vomiting and Other (See Comments)    Seizure like activity?  .Marland KitchenIodinated Diagnostic Agents Hives    1 hive on lt cheek lasting approximately 1 hour on last 2 CT per pt; needs pre meds in future; 50 mg benadryl po  1 hr prior to exam per Dr. Weber Cooks    Current Outpatient Prescriptions  Medication Sig Dispense Refill  . amLODipine (NORVASC) 5 MG tablet TAKE 1 TABLET (5 MG TOTAL) BY MOUTH DAILY. 90 tablet 3  . aspirin EC 81 MG tablet Take 81 mg by mouth daily.    . budesonide-formoterol (SYMBICORT) 160-4.5 MCG/ACT inhaler Inhale 1-2 puffs into the lings every 12 hours. Gargle and Spit after use. 10.2 Inhaler 3  . Glucosamine-Chondroit-Vit C-Mn (GLUCOSAMINE 1500 COMPLEX PO) Take 1,500 mg by mouth daily.     Marland Kitchen levETIRAcetam (KEPPRA XR) 500 MG 24 hr tablet Take 2 tablet every night 180 tablet 3  . metoprolol (LOPRESSOR) 50 MG tablet Take 1 tablet (50 mg total) by mouth 2 (two) times daily. 180 tablet 3  . Multiple Vitamin (MULTIVITAMIN) tablet Take 1  tablet by mouth every evening.     . pentoxifylline (TRENTAL) 400 MG CR tablet Take 1 tablet (400 mg total) by mouth 3 (three) times daily. 90 tablet 5  . rosuvastatin (CRESTOR) 40 MG tablet TAKE 1 TABLET (40 MG TOTAL) BY MOUTH DAILY. 30 tablet 6  . vitamin E 1000 UNIT capsule Take 1 capsule (1,000 Units total) by mouth daily. 30 capsule 12   No current facility-administered medications for this visit.     REVIEW OF SYSTEMS:  [X]  denotes positive finding, [ ]  denotes negative finding Cardiac  Comments:  Chest pain or chest pressure:    Shortness of breath upon exertion:    Short of breath when lying flat:    Irregular heart rhythm:        Vascular    Pain in calf, thigh, or hip brought on by ambulation:    Pain in feet at night that wakes you up from your sleep:     Blood clot in your veins:    Leg swelling:         Pulmonary    Oxygen at home:    Productive cough:     Wheezing:         Neurologic    Sudden weakness in arms or legs:  x   Sudden numbness in arms or legs:     Sudden onset of difficulty speaking or slurred speech:    Temporary loss of vision in one eye:     Problems with dizziness:         Gastrointestinal    Blood in stool:     Vomited blood:         Genitourinary    Burning when urinating:     Blood in urine:        Psychiatric    Major depression:         Hematologic    Bleeding problems:    Problems with blood clotting too easily:        Skin    Rashes or ulcers:        Constitutional    Fever or chills:      PHYSICAL EXAM: Vitals:   07/17/16 1258  BP: 136/89  Pulse: 80  Resp: 16  Temp: 98.5 F (36.9 C)  SpO2: 97%  Weight: 147 lb (66.7 kg)  Height: 5' 4"  (1.626 m)    GENERAL: The patient is a well-nourished male, in no acute distress. The vital signs are documented above. CARDIAC: There is a regular rate and rhythm.  VASCULAR: Palpable femoral pulses.  No carotid bruits PULMONARY: There is good air exchange bilaterally without  wheezing or rales.  ABDOMEN: Soft and non-tender with normal pitched bowel sounds.  MUSCULOSKELETAL: There are no major deformities or cyanosis. NEUROLOGIC: No focal weakness or paresthesias are detected. SKIN: There are no ulcers or rashes noted. PSYCHIATRIC: The patient has a normal affect.  DATA:  I have reviewed his CT scan.  I fixed his aneurysm at 5.2 cm.  The past several CT scans have shown a slight increase in the size of his aneurysm with a type II endoleak.  It now measures 5.9 cm  MEDICAL ISSUES: Status post intervascular aneurysm repair with type II endoleak and progressive enlargement of his aneurysm sac, now measuring 5.9 cm.  I will refer the patient to interventional radiology for consideration of embolization of his endoleak.  He is scheduled to follow with me again in 6 months.    Annamarie Major, MD Vascular and Vein Specialists of [facility] [phone] Pager [phone]

## 2016-07-18 ENCOUNTER — Other Ambulatory Visit: Payer: Self-pay | Admitting: Surgery

## 2016-07-18 DIAGNOSIS — IMO0002 Reserved for concepts with insufficient information to code with codable children: Secondary | ICD-10-CM

## 2016-07-18 DIAGNOSIS — T82330A Leakage of aortic (bifurcation) graft (replacement), initial encounter: Secondary | ICD-10-CM

## 2016-07-20 ENCOUNTER — Ambulatory Visit
Admission: RE | Admit: 2016-07-20 | Discharge: 2016-07-20 | Disposition: A | Discharge status: Home / Self Care | Payer: Medicare Other | Source: Ambulatory Visit | Attending: Radiation Oncology | Admitting: Radiation Oncology

## 2016-07-20 ENCOUNTER — Ambulatory Visit
Admission: RE | Admit: 2016-07-20 | Discharge: 2016-07-20 | Disposition: A | Discharge status: Home / Self Care | Payer: Medicare Other | Source: Ambulatory Visit | Attending: Surgery | Admitting: Surgery

## 2016-07-20 ENCOUNTER — Other Ambulatory Visit: Payer: Medicare Other

## 2016-07-20 DIAGNOSIS — C7949 Secondary malignant neoplasm of other parts of nervous system: Secondary | ICD-10-CM

## 2016-07-20 DIAGNOSIS — IMO0002 Reserved for concepts with insufficient information to code with codable children: Secondary | ICD-10-CM

## 2016-07-20 DIAGNOSIS — T82330A Leakage of aortic (bifurcation) graft (replacement), initial encounter: Secondary | ICD-10-CM

## 2016-07-20 DIAGNOSIS — C7931 Secondary malignant neoplasm of brain: Secondary | ICD-10-CM

## 2016-07-20 DIAGNOSIS — I714 Abdominal aortic aneurysm, without rupture: Secondary | ICD-10-CM | POA: Diagnosis not present

## 2016-07-20 HISTORY — PX: IR GENERIC HISTORICAL: IMG1180011

## 2016-07-20 MED ORDER — GADOBENATE DIMEGLUMINE 529 MG/ML IV SOLN
13.0000 mL | Freq: Once | INTRAVENOUS | Status: AC | PRN
  Administered 2016-07-20: 13 mL via INTRAVENOUS

## 2016-07-20 NOTE — Consult Note (Signed)
Chief Complaint: Abdominal aortic aneurysm  Referring Physician(s): Brabham,Vance W  History of Present Illness: DMARION PERFECT is a 75 y.o. male with past medical history significant for smoking, hypertension, hyperlipidemia, CAD and lung cancer (with brain metastasis though currently with stable disease without progression) who underwent a technically successful endovascular repair of infrarenal abdominal aortic aneurysm on 06/08/2014 by Dr. Trula Slade. Surveillance imaging (obtained for his subsequent diagnosis of lung cancer) has demonstrated a persistent endoleak with findings worrisome for potential enlargement of the abdominal aortic aneurysm sac. As such, the patient presented interventional radiology clinic for evaluation and management. The patient is accompanied by his wife though serves as his own historian.  The patient is currently without complaint. No abdominal pain. No change in activity or energy level.  Note, the patient does have a contrast allergy though was able to receive intravenous contrast with only a Benadryl prep.  Past Medical History:  Diagnosis Date  . AAA (abdominal aortic aneurysm) ([place])   . Asymptomatic cholelithiasis 07/2015   Incidental finding on PET CT  . Back pain 04/19/2016  . Coronary artery disease    MI 1992, S/P  PTCA; negative stress test in November 2011 with no ischemia.   . Diverticulosis   . Hearing loss    Bilateral   . History of radiation therapy 11/10/13-12/12/13   lung,50Gy/43f  . Hyperlipidemia   . Hypertension   . Lung cancer ([place] 05/2013   non-small cell;  L upper lobectomy with mediastinal LN dissection, chemo, and radiation in 2014/2015. Remission until pt had questionable seizure 07/2015--MRI showed brain mets; palliative brain rad (stereotactic radiation therapy) started 08/25/15.  CT C/A/P clear 02/2016 and 05/2016.  Plan per onc is to repeat in 6 mo.  . Malignant neoplasm of upper lobe, left bronchus or lung ([facility]  10/29/2013  . Malignant neoplasm of upper lobe, left bronchus or lung ([facility] 06/26/2013  . Myocardial infarction 1992   Dr MAngelena Form   . Peripheral vascular disease ([facility] 08/2012   4.8x4.6 cm infrarenal abdominal aortic fusiform aneurysm, 1.5 cm right common iliac artery aneurysm.  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  . [facility]   Stereotactic radiation therapy: frontoparietal 18gy,posterior frontal lobe 20gy,  . Seizure disorder ([facility] 2016/2017   as sequela of brain mets;  Grand mal seizure 07/2015, then got on keppra and was seizure-free until focal motor seizures of L side of face began 02/2016- these responded well to up-titration of keppra.  . Shortness of breath   . Tobacco dependence    "Quit" 1992, but pt has smoked "on and off" since that time    Past Surgical History:  Procedure Laterality Date  . ABDOMINAL AORTIC ENDOVASCULAR STENT GRAFT N/A 05/07/2014   Procedure: ABDOMINAL AORTIC ENDOVASCULAR STENT GRAFT;  Surgeon: VSerafina Mitchell MD;  [facility];  Service: Vascular;  Laterality: N/A;  . Carotid duplex dopplers  07/2015   1-39% on R, no signif dz noted on L  . COLONOSCOPY W/ POLYPECTOMY  2003    negative 2010,due 2020; Dr BOlevia Perches . CORONARY ANGIOPLASTY     no stents  . CYSTOSCOPY/RETROGRADE/URETEROSCOPY Bilateral 10/09/2012   Procedure: BILATERAL RETROGRADE bladder and urethral BIOPSY ;  Surgeon: DMolli Hazard MD;  Location: [facility];  Service: Urology;  Laterality: Bilateral;  BILATERAL RETROGRADE   . EEG  08/05/15   Pt placed on keppra  just prior to this test due to having ? seizure (MRI showed brain mets)  . HERNIA REPAIR Bilateral    Inguinal  . INGUINAL HERIIORRHAPHY BILATERALLY    . MEDIASTINOSCOPY N/A 05/01/2013   Procedure: MEDIASTINOSCOPY;  Surgeon: Gaye Pollack, MD;  Location[facility];  Service: Thoracic;  Laterality: N/A;  .  PFTs  04/2013   Minimal obstructive airway disease  . PILONIDAL CYST EXCISION    . PROSTATE BIOPSY N/A 10/09/2012   Procedure: PROSTATIC URETHRAL BIOPSY;  Surgeon: Molli Hazard, MD;  Location: [facility];  Service: Urology;  Laterality: N/A;  PROSTATIC URETHRAL BIOPSY   . PTCA  1992  . ROTATOR CUFF REPAIR     Bilateral  . THOROCOTOMY WITH LOBECTOMY Left 05/29/2013   Procedure: LEFT THOROCOTOMY WITH LEFT UPPER LOBE LOBECTOMY;  Surgeon: Gaye Pollack, MD;  Location: [place];  Service: Thoracic;  Laterality: Left;  Marland Kitchen VIDEO BRONCHOSCOPY N/A 05/01/2013   Procedure: VIDEO BRONCHOSCOPY;  Surgeon: Gaye Pollack, MD;  Location: [facility];  Service: Thoracic;  Laterality: N/A;    Allergies: Fluvirin [influenza vac typ a&b surf ant] and Iodinated diagnostic agents  Medications: Prior to Admission medications   Medication Sig Start Date End Date Taking? Authorizing Provider  amLODipine (NORVASC) 5 MG tablet TAKE 1 TABLET (5 MG TOTAL) BY MOUTH DAILY. 10/18/15   Tammi Sou, MD  aspirin EC 81 MG tablet Take 81 mg by mouth daily.    Historical Provider, MD  budesonide-formoterol (SYMBICORT) 160-4.5 MCG/ACT inhaler Inhale 1-2 puffs into the lings every 12 hours. Gargle and Spit after use. 07/07/16   Tammi Sou, MD  Glucosamine-Chondroit-Vit C-Mn (GLUCOSAMINE 1500 COMPLEX PO) Take 1,500 mg by mouth daily.     Historical Provider, MD  levETIRAcetam (KEPPRA XR) 500 MG 24 hr tablet Take 2 tablet every night 03/09/16   Cameron Sprang, MD  metoprolol (LOPRESSOR) 50 MG tablet Take 1 tablet (50 mg total) by mouth 2 (two) times daily. 10/18/15   Tammi Sou, MD  Multiple Vitamin (MULTIVITAMIN) tablet Take 1 tablet by mouth every evening.     Historical Provider, MD  pentoxifylline (TRENTAL) 400 MG CR tablet Take 1 tablet (400 mg total) by mouth 3 (three) times daily. 12/06/15   Hayden Pedro, PA-C  rosuvastatin (CRESTOR) 40 MG tablet TAKE 1 TABLET (40 MG TOTAL) BY MOUTH DAILY. 05/19/16   Tammi Sou, MD  vitamin E 1000 UNIT capsule Take 1 capsule (1,000 Units total) by mouth daily. 12/06/15   Hayden Pedro, PA-C     Family History  Problem Relation Age of Onset  . Hypertension Mother   . Prostate cancer Father     Prostate cancer  . Cancer Father     Brain Tumor  . Lung cancer Sister     NON SMOKER  . Cancer Sister   . Hyperlipidemia Sister   . Hyperlipidemia Brother   . Heart attack Brother   . Hypertension Brother   . Diabetes Neg Hx   . Stroke Neg Hx     Social History   Social History  . Marital status: Married    Spouse name: N/A  . Number of children: N/A  . Years of education: N/A   Occupational History  . Retired Risk analyst    Social History Main Topics  . Smoking status: Current Every Day Smoker    Packs/day: 1.00    Years: 24.00    Types: Cigarettes    Last attempt  to quit: 05/28/2013  . Smokeless tobacco: Never Used     Comment: QUIT 15 YEARS AGO  . Alcohol use 9.0 oz/week    3 Glasses of wine, 12 Cans of beer per week     Comment: 1-2 beers a day  . Drug use: No  . Sexual activity: Not on file   Other Topics Concern  . Not on file   Social History Narrative   HEART HEALTHY DIET.     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   MARRIED, 2 children who live in [place].  Smoked on and off since then.   ALCOHOL USE -YES- RED WINE SOCIALLY, couple of beers at night usually.    ECOG Status: 0 - Asymptomatic  Review of Systems: A 12 point ROS discussed and pertinent positives are indicated in the HPI above.  All other systems are negative.  Review of Systems  Constitutional: Negative for activity change and unexpected weight change.  Respiratory: Negative.   Cardiovascular: Negative.   Gastrointestinal: Negative for abdominal pain.    Vital Signs: BP 135/85 (BP Location: Left Arm, Patient Position: Sitting, Cuff Size: Normal)   Pulse 83   Temp 98.2 F (36.8 C) (Oral)   Resp 14   Ht '5\' 4"'$  (1.626 m)   Wt  146 lb (66.2 kg)   SpO2 98%   BMI 25.06 kg/m   Physical Exam  Constitutional: He appears well-developed and well-nourished.  HENT:  Head: Atraumatic.  Nursing note and vitals reviewed.   Imaging:  CT scan chest, abdomen and pelvis - 05/26/2016; post EVAR CTA - 06/08/2014.  CT scan of the abdomen and pelvis performed immediately following the EVAR demonstrated findings worrisome for a combined type IA and type II endoleak supplied by incomplete apposition of the cranial aspect of EVAR stent with serpiginous flow exiting the IMA.  At that time, the aneurysm measured 4.9 x 5.0 x 4.8 cm in greatest oblique short axis axial, sagittal and coronal dimensions respectively by my measurement.  CT scan of the chest, abdomen and pelvis (examination was performed for lung cancer staging purposes and not timed for stent graft evaluation) demonstrates findings compatible with a persistent endoleak with the native aneurysm sac measuring approximately 4.9 x 5.2 x 4.8 cm in greatest oblique short axis axial, sagittal and coronal dimensions respectively, again by my measurement.  Images were reviewed in detail with the patient and the patient's wife.   Mr Jeri Cos Wo Contrast  Result Date: 07/20/2016 CLINICAL DATA:  S RS restaging.  Metastatic lung cancer. Creatinine was obtained on site at [facility] at [address] Results: Creatinine 0.8 mg/dL. EXAM: MRI HEAD WITHOUT AND WITH CONTRAST TECHNIQUE: Multiplanar, multiecho pulse sequences of the brain and surrounding structures were obtained without and with intravenous contrast. CONTRAST:  74m MULTIHANCE GADOBENATE DIMEGLUMINE 529 MG/ML IV SOLN COMPARISON:  04/17/2016.  01/18/2016.  12/03/2015. FINDINGS: Brain: No new brain lesions. Previously treated cystic lesions in the right parietal region and right parietal vertex appear stable. Right parietal lesion measures 12 x 16 mm transverse, unchanged since the previous study. Right parietal vertex  lesion measures 18 x 825 mm transverse, unchanged since the previous study. There slightly less white matter edema in the right hemisphere. No midline shift. No new brain lesion. No sign of interval ischemic infarction. Chronic small-vessel changes of the white matter are stable. Vascular: Major vessels at the base of the brain show flow. Skull and upper cervical spine: Normal Sinuses/Orbits: Clear/normal  Other: None significant IMPRESSION: Stable appearance of the 2 treated cystic lesions in the right parietal lobe. No evidence of tumor growth. Slightly less right hemispheric edema. No new lesion. Electronically Signed   By: Nelson Chimes M.D.   On: 07/20/2016 12:48    Labs:  CBC:  Recent Labs  09/16/15 0927 12/16/15 0803 03/14/16 1045 05/26/16 0952  WBC 12.9* 7.7 10.8* 9.8  HGB 14.3 14.3 15.7 15.7  HCT 43.0 42.9 46.8 47.4  PLT 165.0 220 207 219    COAGS: No results for input(s): INR, APTT in the last 8760 hours.  BMP:  Recent Labs  08/04/15 1837 08/05/15 0145 09/07/15 1016 09/16/15 0927 12/06/15 1458 12/16/15 0803 03/14/16 1045 05/26/16 0952  NA 138 137 138 140  --  141 144 143  K 4.2 3.7 5.3* 4.9  --  4.3 5.3* 5.2*  CL 102 104 102 101  --   --   --   --   CO2 '25 25 30 '$ 33*  --  '26 29 27  '$ GLUCOSE 104* 103* 99 85  --  113 101 111  BUN 13 11 24* 30*  --  9.4 10.8 10.6  CALCIUM 9.4 8.7* 8.9 9.1  --  9.7 10.3 10.3  CREATININE 0.84 0.74 0.69 0.71 0.83 0.8 0.9 0.9  GFRNONAA >60 >60  --   --   --   --   --   --   GFRAA >60 >60  --   --   --   --   --   --     LIVER FUNCTION TESTS:  Recent Labs  09/16/15 0927 12/16/15 0803 03/14/16 1045 05/26/16 0952  BILITOT 0.4 0.36 0.46 0.45  AST '21 17 19 19  '$ ALT 49 '19 24 19  '$ ALKPHOS 48 53 61 74  PROT 5.6* 7.0 7.8 8.0  ALBUMIN 3.5 3.7 4.0 4.1    Assessment and Plan:  Jordan Mccullough is a 75 y.o. male with past medical history significant for smoking, hypertension, hyperlipidemia, CAD and lung cancer (with brain metastasis,  though currently with stable disease without progression) who underwent a technically successful endovascular repair of infrarenal abdominal aortic aneurysm on 06/08/2014 by Dr. Trula Slade. Surveillance imaging (obtained for his subsequent diagnosis of lung cancer) has demonstrated a persistent endoleak with findings worrisome for potential enlargement of the abdominal aortic aneurysm sac.  CT scan of the abdomen and pelvis performed immediately following the EVAR on 06/08/14 demonstrated findings worrisome for a combined type IA and type II endoleak supplied by incomplete apposition of the cranial aspect of EVAR stent with serpiginous flow exiting the IMA.  At that time, the aneurysm measured 4.9 x 5.0 x 4.8 cm in greatest oblique short axis axial, sagittal and coronal dimensions respectively by my measurement.  Most recent CT scan of the chest, abdomen and pelvis performed on 05/26/16 (examination was performed for lung cancer staging purposes and not timed for stent graft evaluation) demonstrates findings compatible with a persistent endoleak (though the suspected type IA component has been less apparent on such examinations), with the native aneurysm sac measuring approximately 4.9 x 5.2 x 4.8 cm in greatest oblique short axis axial, sagittal and coronal dimensions respectively, again by my measurement.  Images were reviewed in detail with the patient and the patient's wife.  Given lack of substantial growth during the past 2 years, only 2 mm by my direct measurement, urgent intervention is not warranted.   I am however concerned that the immediate postprocedural CT scan  demonstrated findings worrisome for a potential type IA endoleak, though this was less well-demonstrated on subsequent non-CTA protocol examinations.  As such, I will obtain a repeat post EVAR stent CTA of the abdomen and pelvis to better evaluate the residual endoleak.   If the CTA is diagnostic for a type IA endoleak, the patient would  likely benefit from placement of a cuff about the cranial aspect of the stent graft. If the examination does not confirm the presence of a type IA endoleak, I feel potential treatment options could include further evaluation with diagnostic arteriogram versus continued observation.  The patient will return to the interventional radiology clinic following the acquisition of the CTA.  Note, the patient does have a contrast allergy though was able to receive intravenous contrast with only a Benadryl prep.  The patient and the patient's wife were encouraged to call the interventional radiology clinic with any interval questions or concerns.  Thank you for this interesting consult.  I greatly enjoyed meeting HARKIRAT OROZCO and look forward to participating in their care.  A copy of this report was sent to the requesting provider on this date.  Electronically Signed: Sandi Mariscal 07/20/2016, 1:49 PM   I spent a total of 30 Minutes in face to face in clinical consultation, greater than 50% of which was counseling/coordinating care for abdominal aortic aneurysm

## 2016-07-24 ENCOUNTER — Ambulatory Visit
Admission: RE | Admit: 2016-07-24 | Discharge: 2016-07-24 | Disposition: A | Discharge status: Home / Self Care | Payer: Medicare Other | Source: Ambulatory Visit | Attending: Radiation Oncology | Admitting: Radiation Oncology

## 2016-07-24 ENCOUNTER — Encounter: Payer: Self-pay | Admitting: Radiation Oncology

## 2016-07-24 DIAGNOSIS — E785 Hyperlipidemia, unspecified: Secondary | ICD-10-CM | POA: Insufficient documentation

## 2016-07-24 DIAGNOSIS — C3412 Malignant neoplasm of upper lobe, left bronchus or lung: Secondary | ICD-10-CM | POA: Diagnosis not present

## 2016-07-24 DIAGNOSIS — Z902 Acquired absence of lung [part of]: Secondary | ICD-10-CM | POA: Insufficient documentation

## 2016-07-24 DIAGNOSIS — Z923 Personal history of irradiation: Secondary | ICD-10-CM | POA: Insufficient documentation

## 2016-07-24 DIAGNOSIS — C7931 Secondary malignant neoplasm of brain: Secondary | ICD-10-CM | POA: Diagnosis not present

## 2016-07-24 DIAGNOSIS — Z79899 Other long term (current) drug therapy: Secondary | ICD-10-CM | POA: Insufficient documentation

## 2016-07-24 DIAGNOSIS — Z8249 Family history of ischemic heart disease and other diseases of the circulatory system: Secondary | ICD-10-CM | POA: Insufficient documentation

## 2016-07-24 DIAGNOSIS — Z8042 Family history of malignant neoplasm of prostate: Secondary | ICD-10-CM | POA: Diagnosis not present

## 2016-07-24 DIAGNOSIS — Z91041 Radiographic dye allergy status: Secondary | ICD-10-CM | POA: Diagnosis not present

## 2016-07-24 DIAGNOSIS — I739 Peripheral vascular disease, unspecified: Secondary | ICD-10-CM | POA: Insufficient documentation

## 2016-07-24 DIAGNOSIS — G40909 Epilepsy, unspecified, not intractable, without status epilepticus: Secondary | ICD-10-CM | POA: Insufficient documentation

## 2016-07-24 DIAGNOSIS — I2510 Atherosclerotic heart disease of native coronary artery without angina pectoris: Secondary | ICD-10-CM | POA: Diagnosis not present

## 2016-07-24 DIAGNOSIS — Z7951 Long term (current) use of inhaled steroids: Secondary | ICD-10-CM | POA: Diagnosis not present

## 2016-07-24 DIAGNOSIS — Z7982 Long term (current) use of aspirin: Secondary | ICD-10-CM | POA: Insufficient documentation

## 2016-07-24 DIAGNOSIS — I252 Old myocardial infarction: Secondary | ICD-10-CM | POA: Insufficient documentation

## 2016-07-24 DIAGNOSIS — Z9861 Coronary angioplasty status: Secondary | ICD-10-CM | POA: Insufficient documentation

## 2016-07-24 DIAGNOSIS — F17210 Nicotine dependence, cigarettes, uncomplicated: Secondary | ICD-10-CM | POA: Diagnosis not present

## 2016-07-24 DIAGNOSIS — I10 Essential (primary) hypertension: Secondary | ICD-10-CM | POA: Diagnosis not present

## 2016-07-24 NOTE — Progress Notes (Signed)
Radiation Oncology         [phone] ________________________________  Name: Jordan Mccullough MRN: [id number]  Date: 07/24/2016  DOB: [birth year]  Follow-Up Visit Note  CC: Tammi Sou, MD  Gaye Pollack, MD  Diagnosis:   Metastatic Lung Cancer  Indication for treatment:  palliative       Radiation treatment dates:   08/25/15  [facility] Treatment:    1.  PTV1 Rt Vertex Frontoparietal 40m target was treated using 4 Arcs to a prescription dose of 18 Gy. ExacTrac Snap verification was performed for each couch angle.  2.  PTV2  Rt Posterior Frontal Lobe 1101mtarget was treated using 4 Arcs to a prescription dose of 20 Gy. ExacTrac Snap verification was performed for each couch angle.   Narrative:   Jordan Mccullough a very pleasant [age].o. gentleman with a history of metastatic non-small cell lung cancer who received SRS to 2 lesions in the frontoparietal region. He has been doing well since completing therapy, and has been on systemic therapy after he had a scan of the chest abdomen and pelvis on October that showed resolution of his treated disease in the chest, and no evidence of progressive disease. He also had an MRI scan on 07/20/2016 of the brain which revealed stability in the 2 treated lesion without evidence of any new disease in the brain, and resolution of the previously noted edema of the white matter surrounding the treatment target. He continues to take Keppra 1000 mg a day, vitamin E, and Trental. He is interested in knowing if he really needs to remain on Trental and vitamin E.  On review of systems, the patient reports that he is doing well overall. He denies any chest pain, shortness of breath, cough, fevers, chills, night sweats, unintended weight changes. He denies any bowel or bladder disturbances, and denies abdominal pain, nausea or vomiting. He denies any new musculoskeletal or joint aches or pains. He denies any headaches, blurred vision, double vision, auditory disturbances.  He reports that he does continue to have fatigue, and tires easily with minimal activity. He also continues to smoke. A complete review of systems is obtained and is otherwise negative.  Past Medical History:  Past Medical History:  Diagnosis Date  . AAA (abdominal aortic aneurysm) ([facility]  . Asymptomatic cholelithiasis 07/2015   Incidental finding on PET CT  . Back pain 04/19/2016  . Coronary artery disease    MI 1992, S/P  PTCA; negative stress test in November 2011 with no ischemia.   . Diverticulosis   . Hearing loss    Bilateral   . History of radiation therapy 11/10/13-12/12/13   lung,50Gy/2510f. Hyperlipidemia   . Hypertension   . Lung cancer ([facility]CC[facility]0/2014   non-small cell;  L upper lobectomy with mediastinal LN dissection, chemo, and radiation in 2014/2015. Remission until pt had questionable seizure 07/2015--MRI showed brain mets; palliative brain rad (stereotactic radiation therapy) started 08/25/15.  CT C/A/P clear 02/2016 and 05/2016.  Plan per onc is to repeat in 6 mo.  . Malignant neoplasm of upper lobe, left bronchus or lung (HCCBrady/06/2014  . Malignant neoplasm of upper lobe, left bronchus or lung ([facility]CCLarrab[facility]1/01/2013  . Myocardial infarction 1992   Dr McAAngelena Form . Peripheral vascular disease (HCCFormoso1/2014   4.8x4.6 cm infrarenal abdominal aortic fusiform aneurysm, 1.5 cm right common iliac artery aneurysm.  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  . S/P radiation therapy [facility] 08/25/15   Stereotactic radiation therapy: frontoparietal 18gy,posterior frontal lobe 20gy,  . Seizure disorder ([place]) 2016/2017   as sequela of brain mets;  Grand mal seizure 07/2015, then got on keppra and was seizure-free until focal motor seizures of L side of face began 02/2016- these responded well to up-titration of keppra.  . Shortness of breath   . Tobacco dependence    "Quit" 1992, but pt has smoked "on  and off" since that time    Past Surgical History: Past Surgical History:  Procedure Laterality Date  . ABDOMINAL AORTIC ENDOVASCULAR STENT GRAFT N/A 05/07/2014   Procedure: ABDOMINAL AORTIC ENDOVASCULAR STENT GRAFT;  Surgeon: Serafina Mitchell, MD;  Location: [place] OR;  Service: Vascular;  Laterality: N/A;  . Carotid duplex dopplers  07/2015   1-39% on R, no signif dz noted on L  . COLONOSCOPY W/ POLYPECTOMY  2003    negative 2010,due 2020; Dr Olevia Perches  . CORONARY ANGIOPLASTY     no stents  . CYSTOSCOPY/RETROGRADE/URETEROSCOPY Bilateral 10/09/2012   Procedure: BILATERAL RETROGRADE bladder and urethral BIOPSY ;  Surgeon: Molli Hazard, MD;  [facility];  Service: Urology;  Laterality: Bilateral;  BILATERAL RETROGRADE   . EEG  08/05/15   Pt placed on keppra just prior to this test due to having ? seizure (MRI showed brain mets)  . HERNIA REPAIR Bilateral    Inguinal  . INGUINAL HERIIORRHAPHY BILATERALLY    . MEDIASTINOSCOPY N/A 05/01/2013   Procedure: MEDIASTINOSCOPY;  Surgeon: Gaye Pollack, MD;  Location: [facility] OR;  Service: Thoracic;  Laterality: N/A;  . PFTs  04/2013   Minimal obstructive airway disease  . PILONIDAL CYST EXCISION    . PROSTATE BIOPSY N/A 10/09/2012   Procedure: PROSTATIC URETHRAL BIOPSY;  Surgeon: Molli Hazard, MD;  [facility];  Service: Urology;  Laterality: N/A;  PROSTATIC URETHRAL BIOPSY   . PTCA  1992  . ROTATOR CUFF REPAIR     Bilateral  . THOROCOTOMY WITH LOBECTOMY Left 05/29/2013   Procedure: LEFT THOROCOTOMY WITH LEFT UPPER LOBE LOBECTOMY;  Surgeon: Gaye Pollack, MD;  Location: [place];  Service: Thoracic;  Laterality: Left;  Marland Kitchen VIDEO BRONCHOSCOPY N/A 05/01/2013   Procedure: VIDEO BRONCHOSCOPY;  Surgeon: Gaye Pollack, MD;  [facility];  Service: Thoracic;  Laterality: N/A;    Social History:  Social History   Social History  . Marital status: Married    Spouse name: N/A  . Number of children: N/A  . Years of education: N/A    Occupational History  . Retired Risk analyst    Social History Mccullough Topics  . Smoking status: Current Every Day Smoker    Packs/day: 1.00    Years: 24.00    Types: Cigarettes    Last attempt to quit: 05/28/2013  . Smokeless tobacco: Never Used     Comment: QUIT 15 YEARS AGO  . Alcohol use 9.0 oz/week    3 Glasses of wine, 12 Cans of beer per week     Comment: 1-2 beers a day  . Drug use: No  . Sexual activity: Not on file   Other Topics Concern  . Not on file   Social History Narrative   HEART HEALTHY DIET.     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   MARRIED, 2 children who live in [place].  Smoked on and off since then.   ALCOHOL USE -  YES- RED WINE SOCIALLY, couple of beers at night usually.    Family History: Family History  Problem Relation Age of Onset  . Hypertension Mother   . Prostate cancer Father     Prostate cancer  . Cancer Father     Brain Tumor  . Lung cancer Sister     NON SMOKER  . Cancer Sister   . Hyperlipidemia Sister   . Hyperlipidemia Brother   . Heart attack Brother   . Hypertension Brother   . Diabetes Neg Hx   . Stroke Neg Hx     ALLERGIES:  is allergic to fluvirin [influenza vac typ a&b surf ant] and iodinated diagnostic agents.  Meds: Current Outpatient Prescriptions  Medication Sig Dispense Refill  . amLODipine (NORVASC) 5 MG tablet TAKE 1 TABLET (5 MG TOTAL) BY MOUTH DAILY. 90 tablet 3  . aspirin EC 81 MG tablet Take 81 mg by mouth daily.    . budesonide-formoterol (SYMBICORT) 160-4.5 MCG/ACT inhaler Inhale 1-2 puffs into the lings every 12 hours. Gargle and Spit after use. 10.2 Inhaler 3  . Glucosamine-Chondroit-Vit C-Mn (GLUCOSAMINE 1500 COMPLEX PO) Take 1,500 mg by mouth daily.     Marland Kitchen levETIRAcetam (KEPPRA XR) 500 MG 24 hr tablet Take 2 tablet every night 180 tablet 3  . metoprolol (LOPRESSOR) 50 MG tablet Take 1 tablet (50 mg total) by mouth 2 (two) times daily. 180 tablet 3  . Multiple Vitamin (MULTIVITAMIN)  tablet Take 1 tablet by mouth every evening.     . pentoxifylline (TRENTAL) 400 MG CR tablet Take 1 tablet (400 mg total) by mouth 3 (three) times daily. 90 tablet 5  . vitamin E 1000 UNIT capsule Take 1 capsule (1,000 Units total) by mouth daily. 30 capsule 12  . rosuvastatin (CRESTOR) 40 MG tablet TAKE 1 TABLET (40 MG TOTAL) BY MOUTH DAILY. (Patient not taking: Reported on 07/24/2016) 30 tablet 6   No current facility-administered medications for this encounter.     Physical Findings:  weight is 147 lb (66.7 kg). His oral temperature is 98.5 F (36.9 C). His blood pressure is 140/91 (abnormal) and his pulse is 78. His respiration is 20 and oxygen saturation is 100%. .   In general this is a well appearing Caucasian male in no acute distress. He's alert and oriented x4 and appropriate throughout the examination. Cardiopulmonary assessment is negative for acute distress and he exhibits normal effort. Neurologic assessment reveals 4/5 proximal strength in the left UE and LE. Grip strength 5/5.  Lab Findings: Lab Results  Component Value Date   WBC 9.8 05/26/2016   HGB 15.7 05/26/2016   HCT 47.4 05/26/2016   MCV 85.6 05/26/2016   PLT 219 05/26/2016     Radiographic Findings: Jordan Mccullough EU Contrast  Result Date: 07/20/2016 CLINICAL DATA:  S RS restaging.  Metastatic lung cancer. Creatinine was obtained on site at [place] at [address] Results: Creatinine 0.8 mg/dL. EXAM: MRI HEAD WITHOUT AND WITH CONTRAST TECHNIQUE: Multiplanar, multiecho pulse sequences of the brain and surrounding structures were obtained without and with intravenous contrast. CONTRAST:  69m MULTIHANCE GADOBENATE DIMEGLUMINE 529 MG/ML IV SOLN COMPARISON:  04/17/2016.  01/18/2016.  12/03/2015. FINDINGS: Brain: No new brain lesions. Previously treated cystic lesions in the right parietal region and right parietal vertex appear stable. Right parietal lesion measures 12 x 16 mm transverse, unchanged since  the previous study. Right parietal vertex lesion measures 18 x 825 mm transverse, unchanged since the previous study. There slightly  less white matter edema in the right hemisphere. No midline shift. No new brain lesion. No sign of interval ischemic infarction. Chronic small-vessel changes of the white matter are stable. Vascular: Major vessels at the base of the brain show flow. Skull and upper cervical spine: Normal Sinuses/Orbits: Clear/normal Other: None significant IMPRESSION: Stable appearance of the 2 treated cystic lesions in the right parietal lobe. No evidence of tumor growth. Slightly less right hemispheric edema. No new lesion. Electronically Signed   By: Nelson Chimes M.D.   On: 07/20/2016 12:48    Impression/Plan: 1. Recurrent Stage IIIA, T2a, N2, M0, NSCLC, adenocarcinoma of the left upper lobe. The patient appears to be doing very well technically as well as within the brain. Discussed that his edema has continued to improve. He may remain on vitamin EN Trental as well as his Keppra however if he is interested in discontinuing the vitamin E, he could do this at his discretion. We don't have any edema we are currently treating her evidence of radiation necrosis currently however if he develops symptoms we could go back to this. He states agreement and understanding we will plan for a repeat MRI scan in 3 months time. We'll keep [place] informed any questions or concerns that arise prior to his next visit.    Carola Rhine, PAC

## 2016-07-24 NOTE — Progress Notes (Signed)
Follow up s/p SRS brain, 08/25/15 MRI results 07/20/16 ,here for results, no head aches, no nausea, or vision changes, no light headed appetite good, no c/o pain, has stuffiness ion head feels  That is from allergis 2:28 PM BP (!) 140/91 (BP Location: Left Arm, Patient Position: Sitting, Cuff Size: Normal)   Pulse 78   Temp 98.5 F (36.9 C) (Oral)   Resp 20   Wt 147 lb (66.7 kg)   SpO2 100% Comment: room air  BMI 25.23 kg/m   Wt Readings from Last 3 Encounters:  07/24/16 147 lb (66.7 kg)  07/20/16 146 lb (66.2 kg)  07/17/16 147 lb (66.7 kg)

## 2016-07-26 ENCOUNTER — Telehealth: Payer: Self-pay | Admitting: Radiation Oncology

## 2016-07-26 ENCOUNTER — Other Ambulatory Visit: Payer: Self-pay | Admitting: Radiology

## 2016-07-26 ENCOUNTER — Other Ambulatory Visit (HOSPITAL_COMMUNITY): Payer: Self-pay | Admitting: Interventional Radiology

## 2016-07-26 DIAGNOSIS — IMO0001 Reserved for inherently not codable concepts without codable children: Secondary | ICD-10-CM

## 2016-07-26 DIAGNOSIS — T82330D Leakage of aortic (bifurcation) graft (replacement), subsequent encounter: Secondary | ICD-10-CM

## 2016-07-26 NOTE — Telephone Encounter (Signed)
LM for pt to call me back to discuss vitamin e and trental again.

## 2016-07-26 NOTE — Telephone Encounter (Signed)
I spoke with the patient to review the rationale to stay on trental/vitamin e for now. We will consider discontinuing these medications after the next serial image and review this at conference.

## 2016-07-27 ENCOUNTER — Encounter: Payer: Self-pay | Admitting: Family Medicine

## 2016-07-27 ENCOUNTER — Ambulatory Visit (INDEPENDENT_AMBULATORY_CARE_PROVIDER_SITE_OTHER): Payer: Medicare Other | Admitting: Family Medicine

## 2016-07-27 ENCOUNTER — Ambulatory Visit: Payer: Medicare Other | Admitting: Family Medicine

## 2016-07-27 ENCOUNTER — Other Ambulatory Visit: Payer: Self-pay | Admitting: *Deleted

## 2016-07-27 VITALS — BP 144/84 | HR 77 | Temp 97.9°F | Resp 16 | Ht 64.0 in | Wt 147.5 lb

## 2016-07-27 DIAGNOSIS — T466X5A Adverse effect of antihyperlipidemic and antiarteriosclerotic drugs, initial encounter: Secondary | ICD-10-CM | POA: Diagnosis not present

## 2016-07-27 DIAGNOSIS — E7800 Pure hypercholesterolemia, unspecified: Secondary | ICD-10-CM

## 2016-07-27 DIAGNOSIS — M791 Myalgia, unspecified site: Secondary | ICD-10-CM

## 2016-07-27 DIAGNOSIS — T82330A Leakage of aortic (bifurcation) graft (replacement), initial encounter: Secondary | ICD-10-CM

## 2016-07-27 DIAGNOSIS — IMO0002 Reserved for concepts with insufficient information to code with codable children: Secondary | ICD-10-CM

## 2016-07-27 LAB — LIPID PANEL
Cholesterol: 189 mg/dL (ref 0–200)
HDL: 46.1 mg/dL (ref >39.00)
LDL Cholesterol: 119 mg/dL — ABNORMAL HIGH (ref 0–99)
NonHDL: 143.3
Total CHOL/HDL Ratio: 4
Triglycerides: 123 mg/dL (ref 0.0–149.0)
VLDL: 24.6 mg/dL (ref 0.0–40.0)

## 2016-07-27 LAB — CK: Total CK: 115 U/L (ref 7–232)

## 2016-07-27 LAB — HEPATIC FUNCTION PANEL
ALT: 14 U/L (ref 0–53)
AST: 14 U/L (ref 0–37)
Albumin: 4.4 g/dL (ref 3.5–5.2)
Alkaline Phosphatase: 58 U/L (ref 39–117)
Bilirubin, Direct: 0.1 mg/dL (ref 0.0–0.3)
Total Bilirubin: 0.4 mg/dL (ref 0.2–1.2)
Total Protein: 7.2 g/dL (ref 6.0–8.3)

## 2016-07-27 NOTE — Progress Notes (Signed)
OFFICE VISIT  07/27/2016   CC:  Chief Complaint  Patient presents with  . Follow-up    HCL, pt is not fasting.    HPI:    Patient is a 75 y.o. Caucasian male who presents for follow up hyperlipidemia. He was on generic crestor and was having bilat thigh and bilat biceps pains for the last few months. He stopped the crestor about 2 weeks ago and says L arm and both legs don't hurt anymore at all. Right arm still hurting over biceps but only intermittently.  Ibuprofen helps.  Also, says his urine was dark "like tea" when muscles were hurting but it has returned to light yellow.  No fever, no jaundice, no abd pain, no n/v.  No focal weakness.    Past Medical History:  Diagnosis Date  . AAA (abdominal aortic aneurysm) ([place])   . Asymptomatic cholelithiasis 07/2015   Incidental finding on PET CT  . Back pain 04/19/2016  . Coronary artery disease    MI 1992, S/P  PTCA; negative stress test in November 2011 with no ischemia.   . Diverticulosis   . Hearing loss    Bilateral   . History of radiation therapy 11/10/13-12/12/13   lung,50Gy/65f  . Hyperlipidemia   . Hypertension   . Lung cancer (HRio Blanco 05/2013   non-small cell;  L upper lobectomy with mediastinal LN dissection, chemo, and radiation in 2014/2015. Remission until pt had questionable seizure 07/2015--MRI showed brain mets; palliative brain rad (stereotactic radiation therapy) started 08/25/15.  CT C/A/P clear 02/2016 and 05/2016.  Plan per onc is to repeat in 6 mo.  . Malignant neoplasm of upper lobe, left bronchus or lung ([place] 10/29/2013  . Malignant neoplasm of upper lobe, left bronchus or lung (HFrit[place] 06/26/2013  . Myocardial infarction 1992   Dr MAngelena Form   . Peripheral vascular disease ([facility]McC[place] 08/2012   4.8x4.6 cm infrarenal abdominal aortic fusiform aneurysm, 1.5 cm right common iliac artery aneurysm.  Type II endoleak from inferior mesenteric artery--Vasc surg referred pt to interv rad for possible embolization of the leak as of  07/17/16.  . [facility]   Stereotactic radiation therapy: frontoparietal 18gy,posterior frontal lobe 20gy,  . Seizure disorder ([place] 2016/2017   as sequela of brain mets;  Grand mal seizure 07/2015, then got on keppra and was seizure-free until focal motor seizures of L side of face began 02/2016- these responded well to up-titration of keppra.  . Shortness of breath   . Tobacco dependence    "Quit" 1992, but pt has smoked "on and off" since that time    Past Surgical History:  Procedure Laterality Date  . ABDOMINAL AORTIC ENDOVASCULAR STENT GRAFT N/A 05/07/2014   Procedure: ABDOMINAL AORTIC ENDOVASCULAR STENT GRAFT;  Surgeon: VSerafina Mitchell MD;  [facility];  Service: Vascular;  Laterality: N/A;  . Carotid duplex dopplers  07/2015   1-39% on R, no signif dz noted on L  . COLONOSCOPY W/ POLYPECTOMY  2003    negative 2010,due 2020; Dr BOlevia Perches . CORONARY ANGIOPLASTY     no stents  . CYSTOSCOPY/RETROGRADE/URETEROSCOPY Bilateral 10/09/2012   Procedure: BILATERAL RETROGRADE bladder and urethral BIOPSY ;  Surgeon: DMolli Hazard MD;  [facility];  Service: Urology;  Laterality: Bilateral;  BILATERAL RETROGRADE   . EEG  08/05/15   Pt placed on keppra just prior to this test due to having ? seizure (MRI showed brain mets)  . HERNIA REPAIR Bilateral    Inguinal  .  INGUINAL HERIIORRHAPHY BILATERALLY    . MEDIASTINOSCOPY N/A 05/01/2013   Procedure: MEDIASTINOSCOPY;  Surgeon: Gaye Pollack, MD;  Location: [facility] OR;  Service: Thoracic;  Laterality: N/A;  . PFTs  04/2013   Minimal obstructive airway disease  . PILONIDAL CYST EXCISION    . PROSTATE BIOPSY N/A 10/09/2012   Procedure: PROSTATIC URETHRAL BIOPSY;  Surgeon: Molli Hazard, MD;  [facility];  Service: Urology;  Laterality: N/A;  PROSTATIC URETHRAL BIOPSY   . PTCA  1992  . ROTATOR CUFF REPAIR     Bilateral  . THOROCOTOMY WITH LOBECTOMY Left 05/29/2013   Procedure: LEFT THOROCOTOMY WITH  LEFT UPPER LOBE LOBECTOMY;  Surgeon: Gaye Pollack, MD;  Location: [place];  Service: Thoracic;  Laterality: Left;  Marland Kitchen VIDEO BRONCHOSCOPY N/A 05/01/2013   Procedure: VIDEO BRONCHOSCOPY;  Surgeon: Gaye Pollack, MD;  Location: [facility] OR;  Service: Thoracic;  Laterality: N/A;    Outpatient Medications Prior to Visit  Medication Sig Dispense Refill  . amLODipine (NORVASC) 5 MG tablet TAKE 1 TABLET (5 MG TOTAL) BY MOUTH DAILY. 90 tablet 3  . aspirin EC 81 MG tablet Take 81 mg by mouth daily.    . budesonide-formoterol (SYMBICORT) 160-4.5 MCG/ACT inhaler Inhale 1-2 puffs into the lings every 12 hours. Gargle and Spit after use. 10.2 Inhaler 3  . Glucosamine-Chondroit-Vit C-Mn (GLUCOSAMINE 1500 COMPLEX PO) Take 1,500 mg by mouth daily.     Marland Kitchen levETIRAcetam (KEPPRA XR) 500 MG 24 hr tablet Take 2 tablet every night 180 tablet 3  . metoprolol (LOPRESSOR) 50 MG tablet Take 1 tablet (50 mg total) by mouth 2 (two) times daily. 180 tablet 3  . Multiple Vitamin (MULTIVITAMIN) tablet Take 1 tablet by mouth every evening.     . pentoxifylline (TRENTAL) 400 MG CR tablet Take 1 tablet (400 mg total) by mouth 3 (three) times daily. 90 tablet 5  . vitamin E 1000 UNIT capsule Take 1 capsule (1,000 Units total) by mouth daily. 30 capsule 12  . rosuvastatin (CRESTOR) 40 MG tablet TAKE 1 TABLET (40 MG TOTAL) BY MOUTH DAILY. (Patient not taking: Reported on 07/27/2016) 30 tablet 6   No facility-administered medications prior to visit.     Allergies  Allergen Reactions  . Fluvirin [Influenza Vac Typ A&B Surf Ant] Nausea And Vomiting and Other (See Comments)    Seizure like activity?  Marland Kitchen Iodinated Diagnostic Agents Hives    1 hive on lt cheek lasting approximately 1 hour on last 2 CT per pt; needs pre meds in future; 50 mg benadryl po 1 hr prior to exam per Dr. Weber Cooks    ROS As per HPI  PE: Blood pressure (!) 144/84, pulse 77, temperature 97.9 F (36.6 C), temperature source Oral, resp. rate 16, height '5\' 4"'$  (1.626  m), weight 147 lb 8 oz (66.9 kg), SpO2 99 %. Gen: Alert, well appearing.  Patient is oriented to person, place, time, and situation. AFFECT: pleasant, lucid thought and speech. No muscle tenderness in UE's or LE's. Exam of R biceps is normal.  LABS:    Chemistry      Component Value Date/Time   NA 143 05/26/2016 0952   K 5.2 (H) 05/26/2016 0952   CL 101 09/16/2015 0927   CO2 27 05/26/2016 0952   BUN 10.6 05/26/2016 0952   CREATININE 0.9 05/26/2016 0952      Component Value Date/Time   CALCIUM 10.3 05/26/2016 0952   ALKPHOS 74 05/26/2016 0952   AST 19 05/26/2016 0952  ALT 19 05/26/2016 0952   BILITOT 0.45 05/26/2016 0952      IMPRESSION AND PLAN:  Statin-induced myalgias: plan is to remain off statin for now, check non-fasting lipid panel today, CPK, and hepatic panel.  If hepatic panel and CPK are normal, will start a different statin (atorva '20mg'$  qd) and we'll have him back in 2 mo to recheck things--fasting.  An After Visit Summary was printed and given to the patient.  FOLLOW UP: Return in about 2 months (around 09/27/2016) for f/u hyperlipidemia, hx of statin induced myalgias.  Signed:  Crissie Sickles, MD           07/27/2016

## 2016-07-27 NOTE — Progress Notes (Signed)
Pre visit review using our clinic review tool, if applicable. No additional management support is needed unless otherwise documented below in the visit note. 

## 2016-07-28 ENCOUNTER — Ambulatory Visit (HOSPITAL_COMMUNITY)
Admission: RE | Admit: 2016-07-28 | Discharge: 2016-07-28 | Disposition: A | Discharge status: Home / Self Care | Payer: Medicare Other | Source: Ambulatory Visit | Attending: Interventional Radiology | Admitting: Interventional Radiology

## 2016-07-28 DIAGNOSIS — IMO0001 Reserved for inherently not codable concepts without codable children: Secondary | ICD-10-CM

## 2016-07-28 DIAGNOSIS — M899 Disorder of bone, unspecified: Secondary | ICD-10-CM | POA: Insufficient documentation

## 2016-07-28 DIAGNOSIS — T82330D Leakage of aortic (bifurcation) graft (replacement), subsequent encounter: Secondary | ICD-10-CM | POA: Diagnosis not present

## 2016-07-28 DIAGNOSIS — N329 Bladder disorder, unspecified: Secondary | ICD-10-CM | POA: Diagnosis not present

## 2016-07-28 DIAGNOSIS — I714 Abdominal aortic aneurysm, without rupture: Secondary | ICD-10-CM | POA: Insufficient documentation

## 2016-07-28 DIAGNOSIS — Y832 Surgical operation with anastomosis, bypass or graft as the cause of abnormal reaction of the patient, or of later complication, without mention of misadventure at the time of the procedure: Secondary | ICD-10-CM | POA: Diagnosis not present

## 2016-07-28 DIAGNOSIS — N400 Enlarged prostate without lower urinary tract symptoms: Secondary | ICD-10-CM | POA: Insufficient documentation

## 2016-07-28 DIAGNOSIS — Z85118 Personal history of other malignant neoplasm of bronchus and lung: Secondary | ICD-10-CM | POA: Diagnosis not present

## 2016-07-28 DIAGNOSIS — T82339D Leakage of unspecified vascular graft, subsequent encounter: Secondary | ICD-10-CM | POA: Diagnosis present

## 2016-07-28 DIAGNOSIS — R933 Abnormal findings on diagnostic imaging of other parts of digestive tract: Secondary | ICD-10-CM | POA: Diagnosis not present

## 2016-07-28 LAB — POCT I-STAT CREATININE: Creatinine, Ser: 0.8 mg/dL (ref 0.61–1.24)

## 2016-07-28 MED ORDER — IOPAMIDOL (ISOVUE-370) INJECTION 76%
100.0000 mL | Freq: Once | INTRAVENOUS | Status: AC | PRN
  Administered 2016-07-28: 100 mL via INTRAVENOUS

## 2016-07-28 MED ORDER — ATORVASTATIN CALCIUM 20 MG PO TABS: 20.0000 mg | ORAL_TABLET | Freq: Every day | ORAL | 3 refills | Status: DC

## 2016-07-28 NOTE — Telephone Encounter (Signed)
See MyChart message. Please advise. Thanks.

## 2016-07-30 ENCOUNTER — Other Ambulatory Visit: Payer: Self-pay | Admitting: Family Medicine

## 2016-07-31 ENCOUNTER — Telehealth: Payer: Self-pay | Admitting: Family Medicine

## 2016-07-31 DIAGNOSIS — K5792 Diverticulitis of intestine, part unspecified, without perforation or abscess without bleeding: Secondary | ICD-10-CM

## 2016-07-31 MED ORDER — METRONIDAZOLE 500 MG PO TABS: 500.0000 mg | ORAL_TABLET | Freq: Three times a day (TID) | ORAL | 0 refills | Status: AC

## 2016-07-31 MED ORDER — CIPROFLOXACIN HCL 500 MG PO TABS: 500.0000 mg | ORAL_TABLET | Freq: Two times a day (BID) | ORAL | 0 refills | Status: AC

## 2016-07-31 NOTE — Telephone Encounter (Signed)
I received a call from the interventional radiologist today who wanted to notify me of a small area in pt's colon that looks like infection.  I called the patient to see if he has any symptoms of diverticulitis and I plan on treating pt with cipro and flagyl and then rechecking CT scan in 2-3 weeks--however, I got his VM at both phone #'s listed in the chart.  If pt calls back before I try to call him back after seeing this afternoon's patients, pls let him know this information and let me know so I can send in rx's and order f/u CT.--thx

## 2016-07-31 NOTE — Telephone Encounter (Signed)
Meds eRx'd and CT scan for 2 weeks in future ordered.

## 2016-07-31 NOTE — Telephone Encounter (Signed)
Spoke to patient, he was notified of results and verbalized understanding. He stated he was having no symptoms and medication can be called in to [facility] in [facility].

## 2016-08-01 NOTE — Telephone Encounter (Signed)
Pt advised and voiced understanding.   

## 2016-08-02 ENCOUNTER — Ambulatory Visit
Admission: RE | Admit: 2016-08-02 | Discharge: 2016-08-02 | Disposition: A | Discharge status: Home / Self Care | Payer: Medicare Other | Source: Ambulatory Visit | Attending: Interventional Radiology | Admitting: Interventional Radiology

## 2016-08-02 DIAGNOSIS — T82898A Other specified complication of vascular prosthetic devices, implants and grafts, initial encounter: Secondary | ICD-10-CM | POA: Diagnosis not present

## 2016-08-02 DIAGNOSIS — IMO0001 Reserved for inherently not codable concepts without codable children: Secondary | ICD-10-CM

## 2016-08-02 DIAGNOSIS — T82330D Leakage of aortic (bifurcation) graft (replacement), subsequent encounter: Secondary | ICD-10-CM

## 2016-08-02 HISTORY — PX: IR GENERIC HISTORICAL: IMG1180011

## 2016-08-02 NOTE — Progress Notes (Signed)
Patient ID: Jordan Mccullough, male   DOB: 04-Apr-1941, 75 y.o.   MRN: [id number]         Chief Complaint: Endoleak  Referring Physician(s): Brabham  History of Present Illness: Jordan Mccullough is a 75 y.o. male with past medical history significant for smoking, hypertension, hyperlipidemia, CAD and lung cancer (with brain metastasis though currently with stable disease without progression) who underwent a technically successful endovascular repair of infrarenal abdominal aortic aneurysm on 06/08/2014 by Dr. Trula Slade.  Surveillance imaging (obtained for lung cancer surveillance purposes) has demonstrated a persistent endoleak with findings worrisome for potential enlargement of the native abdominal aortic aneurysm sac. For this, the patient was initially seen the interventional radiology clinic on 07/20/2016 and returns to the interventional radiology clinic today for discussion of CTA of the abdomen and pelvis performed 07/28/2016. The patient is accompanied by his wife though serves as his own historian.  Of note, the recently obtained CTA demonstrated an intramural diverticular abscess within the sigmoid colon of the left lower abdominal quadrant for which the patient has begun antibiotics. Since starting antibiotics the patient states that his left lower abdominal quadrant pain has resolved.  The patient is otherwise without complaint.  Past Medical History:  Diagnosis Date  . AAA (abdominal aortic aneurysm) ([place])   . Asymptomatic cholelithiasis 07/2015   Incidental finding on PET CT  . Back pain 04/19/2016  . Coronary artery disease    MI 1992, S/P  PTCA; negative stress test in November 2011 with no ischemia.   . Diverticulosis   . Hearing loss    Bilateral   . History of radiation therapy 11/10/13-12/12/13   lung,50Gy/10f  . Hyperlipidemia    Crestor= myalgias  . Hypertension   . Lung cancer ([facility] 05/2013   non-small cell;  L upper lobectomy with mediastinal LN dissection, chemo, and  radiation in 2014/2015. Remission until pt had questionable seizure 07/2015--MRI showed brain mets; palliative brain rad (stereotactic radiation therapy) started 08/25/15.  CT C/A/P clear 02/2016 and 05/2016.  Plan per onc is to repeat in 6 mo.  . Malignant neoplasm of upper lobe, left bronchus or lung ([facility] 10/29/2013  . Malignant neoplasm of upper lobe, left bronchus or lung ([facility] 06/26/2013  . Myocardial infarction 1992   Dr MAngelena Form   . Peripheral vascular disease ([facility] 08/2012   4.8x4.6 cm infrarenal abdominal aortic fusiform aneurysm, 1.5 cm right common iliac artery aneurysm.  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  [facility]   Stereotactic radiation therapy: frontoparietal 18gy,posterior frontal lobe 20gy,  . Seizure disorder ([facility] 2016/2017   as sequela of brain mets;  Grand mal seizure 07/2015, then got on keppra and was seizure-free until focal motor seizures of L side of face began 02/2016- these responded well to up-titration of keppra.  . Shortness of breath   . Tobacco dependence    "Quit" 1992, but pt has smoked "on and off" since that time    Past Surgical History:  Procedure Laterality Date  . ABDOMINAL AORTIC ENDOVASCULAR STENT GRAFT N/A 05/07/2014   Procedure: ABDOMINAL AORTIC ENDOVASCULAR STENT GRAFT;  Surgeon: VSerafina Mitchell MD;  [facility];  Service: Vascular;  Laterality: N/A;  . Carotid duplex dopplers  07/2015   1-39% on R, no signif dz noted on L  . COLONOSCOPY W/ POLYPECTOMY  2003    negative 2010,due 2020; Dr BOlevia Perches .  CORONARY ANGIOPLASTY     no stents  . CYSTOSCOPY/RETROGRADE/URETEROSCOPY Bilateral 10/09/2012   Procedure: BILATERAL RETROGRADE bladder and urethral BIOPSY ;  Surgeon: Molli Hazard, MD;  Location: [facility];  Service: Urology;  Laterality: Bilateral;  BILATERAL RETROGRADE   . EEG  08/05/15   Pt placed on keppra  just prior to this test due to having ? seizure (MRI showed brain mets)  . HERNIA REPAIR Bilateral    Inguinal  . INGUINAL HERIIORRHAPHY BILATERALLY    . MEDIASTINOSCOPY N/A 05/01/2013   Procedure: MEDIASTINOSCOPY;  Surgeon: Gaye Pollack, MD;  Location: [facility] OR;  Service: Thoracic;  Laterality: N/A;  . PFTs  04/2013   Minimal obstructive airway disease  . PILONIDAL CYST EXCISION    . PROSTATE BIOPSY N/A 10/09/2012   Procedure: PROSTATIC URETHRAL BIOPSY;  Surgeon: Molli Hazard, MD;  Location: [facility];  Service: Urology;  Laterality: N/A;  PROSTATIC URETHRAL BIOPSY   . PTCA  1992  . ROTATOR CUFF REPAIR     Bilateral  . THOROCOTOMY WITH LOBECTOMY Left 05/29/2013   Procedure: LEFT THOROCOTOMY WITH LEFT UPPER LOBE LOBECTOMY;  Surgeon: Gaye Pollack, MD;  Location: [facility];  Service: Thoracic;  Laterality: Left;  Marland Kitchen VIDEO BRONCHOSCOPY N/A 05/01/2013   Procedure: VIDEO BRONCHOSCOPY;  Surgeon: Gaye Pollack, MD;  Location: [facility] OR;  Service: Thoracic;  Laterality: N/A;    Allergies: Fluvirin [influenza vac typ a&b surf ant]; Rosuvastatin; and Iodinated diagnostic agents  Medications: Prior to Admission medications   Medication Sig Start Date End Date Taking? Authorizing Provider  amLODipine (NORVASC) 5 MG tablet TAKE 1 TABLET (5 MG TOTAL) BY MOUTH DAILY. 10/18/15  Yes Tammi Sou, MD  aspirin EC 81 MG tablet Take 81 mg by mouth daily.   Yes Historical Provider, MD  atorvastatin (LIPITOR) 20 MG tablet Take 1 tablet (20 mg total) by mouth daily. 07/28/16  Yes Tammi Sou, MD  budesonide-formoterol (SYMBICORT) 160-4.5 MCG/ACT inhaler Inhale 1-2 puffs into the lings every 12 hours. Gargle and Spit after use. 07/07/16  Yes Tammi Sou, MD  ciprofloxacin (CIPRO) 500 MG tablet Take 1 tablet (500 mg total) by mouth 2 (two) times daily. 07/31/16 08/14/16 Yes Tammi Sou, MD  Glucosamine-Chondroit-Vit C-Mn (GLUCOSAMINE 1500 COMPLEX PO) Take 1,500 mg by mouth daily.    Yes Historical  Provider, MD  levETIRAcetam (KEPPRA XR) 500 MG 24 hr tablet Take 2 tablet every night 03/09/16  Yes Cameron Sprang, MD  metoprolol (LOPRESSOR) 50 MG tablet Take 1 tablet (50 mg total) by mouth 2 (two) times daily. 10/18/15  Yes Tammi Sou, MD  metroNIDAZOLE (FLAGYL) 500 MG tablet Take 1 tablet (500 mg total) by mouth 3 (three) times daily. 07/31/16 08/14/16 Yes Tammi Sou, MD  Multiple Vitamin (MULTIVITAMIN) tablet Take 1 tablet by mouth every evening.    Yes Historical Provider, MD  pentoxifylline (TRENTAL) 400 MG CR tablet Take 1 tablet (400 mg total) by mouth 3 (three) times daily. 12/06/15  Yes Hayden Pedro, PA-C  vitamin E 1000 UNIT capsule Take 1 capsule (1,000 Units total) by mouth daily. 12/06/15  Yes Hayden Pedro, PA-C     Family History  Problem Relation Age of Onset  . Hypertension Mother   . Prostate cancer Father     Prostate cancer  . Cancer Father     Brain Tumor  . Lung cancer Sister     NON SMOKER  . Cancer Sister   . Hyperlipidemia Sister   .  Hyperlipidemia Brother   . Heart attack Brother   . Hypertension Brother   . Diabetes Neg Hx   . Stroke Neg Hx     Social History   Social History  . Marital status: Married    Spouse name: N/A  . Number of children: N/A  . Years of education: N/A   Occupational History  . Retired Risk analyst    Social History Main Topics  . Smoking status: Current Every Day Smoker    Packs/day: 1.00    Years: 24.00    Types: Cigarettes    Last attempt to quit: 05/28/2013  . Smokeless tobacco: Never Used     Comment: QUIT 15 YEARS AGO  . Alcohol use 9.0 oz/week    3 Glasses of wine, 12 Cans of beer per week     Comment: 1-2 beers a day  . Drug use: No  . Sexual activity: Not Asked   Other Topics Concern  . None   Social History Narrative   HEART HEALTHY DIET.     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   MARRIED, 2 children who live in [place].  Smoked on and off since then.    ALCOHOL USE -YES- RED WINE SOCIALLY, couple of beers at night usually.    ECOG Status: 0 - Asymptomatic  Review of Systems: A 12 point ROS discussed and pertinent positives are indicated in the HPI above.  All other systems are negative.  Review of Systems  Vital Signs: BP 140/88 (BP Location: Right Arm, Patient Position: Sitting, Cuff Size: Normal)   Pulse 81   Temp 97.8 F (36.6 C)   Resp 16   SpO2 97%   Physical Exam    Imaging: Mr Jeri Cos Wo Contrast  Result Date: 07/20/2016 CLINICAL DATA:  S RS restaging.  Metastatic lung cancer. Creatinine was obtained on site at [facility] at [address] Results: Creatinine 0.8 mg/dL. EXAM: MRI HEAD WITHOUT AND WITH CONTRAST TECHNIQUE: Multiplanar, multiecho pulse sequences of the brain and surrounding structures were obtained without and with intravenous contrast. CONTRAST:  35m MULTIHANCE GADOBENATE DIMEGLUMINE 529 MG/ML IV SOLN COMPARISON:  04/17/2016.  01/18/2016.  12/03/2015. FINDINGS: Brain: No new brain lesions. Previously treated cystic lesions in the right parietal region and right parietal vertex appear stable. Right parietal lesion measures 12 x 16 mm transverse, unchanged since the previous study. Right parietal vertex lesion measures 18 x 825 mm transverse, unchanged since the previous study. There slightly less white matter edema in the right hemisphere. No midline shift. No new brain lesion. No sign of interval ischemic infarction. Chronic small-vessel changes of the white matter are stable. Vascular: Major vessels at the base of the brain show flow. Skull and upper cervical spine: Normal Sinuses/Orbits: Clear/normal Other: None significant IMPRESSION: Stable appearance of the 2 treated cystic lesions in the right parietal lobe. No evidence of tumor growth. Slightly less right hemispheric edema. No new lesion. Electronically Signed   By: MNelson ChimesM.D.   On: 07/20/2016 12:48   Ct Angio Abd/pel W/ And/or  W/o  Addendum Date: 07/31/2016   ADDENDUM REPORT: 07/31/2016 09:32 ADDENDUM: Vascular: There is a small focus of contrast just anterior to the aortic stents on sequence 5, image 45 and sequence 13, image 77. Patient has a proximal extension cuff in this area. This area of concern is where the stents are overlapping. Contrast in this area is dense and similar to the contrast in the aorta. Findings  raise concern for a type 3 or possibly a type 1 endoleak. Again, there has been minimal change in the sac size since 03/14/2016. Nonvascular: Focal low-density area in the proximal sigmoid colon wall could represent an intramural abscess with mild surrounding inflammatory changes. Findings are concerning for an acute colitis. Patient's primary care physician, Dr. Shawnie Dapper was made of these findings at 9:10 a.m. on 07/31/2016. Electronically Signed   By: Markus Daft M.D.   On: 07/31/2016 09:32   Result Date: 07/31/2016 CLINICAL DATA:  75 year old with endovascular repair of abdominal aortic aneurysm. Evaluate for endoleak. History of metastatic lung cancer. EXAM: CT ANGIOGRAPHY ABDOMEN AND PELVIS WITH CONTRAST AND WITHOUT CONTRAST TECHNIQUE: Multidetector CT imaging of the abdomen and pelvis was performed using the standard protocol during bolus administration of intravenous contrast. Multiplanar reconstructed images and MIPs were obtained and reviewed to evaluate the vascular anatomy. CONTRAST:  100 mL Isovue 370 COMPARISON:  05/26/2016 and 03/14/2016 FINDINGS: VASCULAR Aorta: There is an aortic stent graft in the infrarenal abdominal aorta. Bifurcated stent extends into the common iliac arteries bilaterally. The aneurysm sac measures 5.5 x 5.1 cm which is stable since 03/14/2016. There continues to be an endoleak supplied by the IMA. There appears to be endoleak outflow from lumbar arteries and the inferior accessory right renal artery. No evidence for an aortic rupture. Celiac: Patent without evidence of  aneurysm, dissection, vasculitis or significant stenosis. SMA: Patent without evidence of aneurysm, dissection, vasculitis or significant stenosis. Renals: Main bilateral renal arteries are patent without significant stenosis. There is filling of the inferior accessory right renal artery which is probably being supplied through the endoleak. IMA: IMA is patent and associated with the endoleak. Inflow: Stent grafts terminate in the common iliac arteries bilaterally. No significant aneurysm sac involving the common iliac arteries. External and internal iliac arteries are patent bilaterally. There is ectasia of the proximal left internal iliac artery measuring up to 1.3 cm and stable. No significant stenosis involving the external iliac arteries. Proximal Outflow: Atherosclerotic disease of the common femoral arteries bilaterally. The proximal femoral arteries are patent bilaterally. Veins: Portal venous system is patent. IVC and renal veins are patent. Iliac veins are patent. Review of the MIP images confirms the above findings. NON-VASCULAR Lower chest: Lung bases are clear. Hepatobiliary: There are calcified gallstones. No evidence for gallbladder distension or inflammatory changes. Normal appearance of the liver. Pancreas: Normal appearance of the pancreas without inflammation or duct dilatation. Spleen: Normal appearance of spleen without enlargement. Adrenals/Urinary Tract: Normal adrenal glands. Multiple left renal cysts without hydronephrosis. Mild wall thickening of the urinary bladder similar to the previous examination and could be related to prostate hypertrophy and bladder outlet obstruction. The lower pole of the right kidney is atrophic with delayed enhancement. This is associated with the aortic endograft with retrograde flow through the inferior accessory right renal artery. Evidence for small cysts in the right kidney without hydronephrosis. Stomach/Bowel: The duodenum is draped over the abdominal  aortic aneurysm sac without complicating features. No evidence for bowel distension or obstruction. Normal appearance of the appendix. There is a focal low-density lesion involving the wall of the proximal sigmoid colon on sequence 6, image 54. This lesion is better characterized on the coronal reformats on sequence 16, image 58. This structure roughly measures 2.3 x 1.5 cm on the coronal formats. There appears to be mild inflammation around the proximal sigmoid colon suggesting inflammation and possibly diverticulitis. Lymphatic: No significant lymph node enlargement in the abdomen or pelvis.  Reproductive: Prostate is enlarged heterogeneous. The prostate measures 5.3 x 5.9 x 5.7 cm. Other: No ascites.  Left inguinal hernia containing fat. Musculoskeletal: Subtle sclerosis involving the lateral right seventh rib is unchanged. Stable vertebral body height loss at L4 and L5 compatible with compression fractures. No acute bone abnormality. IMPRESSION: VASCULAR Endovascular repair of the abdominal aortic aneurysm. Stable position of the aortic stent graft with persistent endoleak. Findings are compatible with a type 2 endoleak. Endoleak appears to be used supplied by the IMA with outflow through lumbar arteries and the accessory right renal artery. Abdominal aortic aneurysm sac is stable in size with a maximum diameter of 5.5 cm. NON-VASCULAR New low-density lesion involving the colonic wall near the junction of the descending colon and sigmoid colon. Mild pericolonic inflammatory changes in this area and findings are compatible with focal colitis and possibly diverticulitis. Recommend follow-up CT or colonoscopy to ensure resolution of this abnormality. Prostate hypertrophy with mild bladder wall thickening. Bladder wall thickening may be related to bladder outlet obstruction. Small focus of sclerosis involving the right seventh rib is stable. This could be associated with history of lung cancer but no significant  change since PET-CT on 07/02/2015. Electronically Signed: By: Markus Daft M.D. On: 07/31/2016 08:16    Labs:  CBC:  Recent Labs  09/16/15 0927 12/16/15 0803 03/14/16 1045 05/26/16 0952  WBC 12.9* 7.7 10.8* 9.8  HGB 14.3 14.3 15.7 15.7  HCT 43.0 42.9 46.8 47.4  PLT 165.0 220 207 219    COAGS: No results for input(s): INR, APTT in the last 8760 hours.  BMP:  Recent Labs  08/04/15 1837 08/05/15 0145 09/07/15 1016 09/16/15 0927  12/16/15 0803 03/14/16 1045 05/26/16 0952 07/28/16 1616  NA 138 137 138 140  --  141 144 143  --   K 4.2 3.7 5.3* 4.9  --  4.3 5.3* 5.2*  --   CL 102 104 102 101  --   --   --   --   --   CO2 '25 25 30 '$ 33*  --  '26 29 27  '$ --   GLUCOSE 104* 103* 99 85  --  113 101 111  --   BUN 13 11 24* 30*  --  9.4 10.8 10.6  --   CALCIUM 9.4 8.7* 8.9 9.1  --  9.7 10.3 10.3  --   CREATININE 0.84 0.74 0.69 0.71  < > 0.8 0.9 0.9 0.80  GFRNONAA >60 >60  --   --   --   --   --   --   --   GFRAA >60 >60  --   --   --   --   --   --   --   < > = values in this interval not displayed.  LIVER FUNCTION TESTS:  Recent Labs  12/16/15 0803 03/14/16 1045 05/26/16 0952 07/27/16 1110  BILITOT 0.36 0.46 0.45 0.4  AST '17 19 19 14  '$ ALT '19 24 19 14  '$ ALKPHOS 53 61 74 58  PROT 7.0 7.8 8.0 7.2  ALBUMIN 3.7 4.0 4.1 4.4    Assessment and Plan:  Jordan Mccullough is a 75 y.o. male with past medical history significant for smoking, hypertension, hyperlipidemia, CAD and lung cancer (with brain metastasis though currently with stable disease without progression) who underwent a technically successful endovascular repair of infrarenal abdominal aortic aneurysm on 06/08/2014 by Dr. Trula Slade.    Personal review of CT scan the abdomen and pelvis performed immediately  following EVAR on 06/08/2014 demonstrated findings worrisome for a combined type 1A / III and type II endoleak with apparent incomplete apposition involving the cranial aspect of the stent and serpiginous flow exiting the  IMA. At the time, the aneurysm measuring approximate 5.2 x 5.1 cm and extended for a distance of approximately 6.2 c m.  Review of most recent CTA of the abdomen and pelvis demonstrated similar though markedly less conspicuous findings of a type IA / III and type II endoleak with flow exiting both the IMA as well as a tiny accessory right renal artery. The aneurysm currently measures approximately 5.4 x 5.1 cm and extends for a distance of approximately 6.3 cm.  Review of CTA demonstrates a patent though markedly tortuous connection between the SMA and IMA rendering transcatheter treatment options of the type II endoleak difficult.  Above findings were discussed with referring vascular surgeon, Dr. Trula Slade. Given lack of substantial growth during the past 2 years (2 mm by my direct measurement), the decision was made to continue conservative management and obtain a follow-up CTA of the abdomen and pelvis in 6 months (June 2018).  This proposed plan of care was discussed at length with the patient and the patient's wife or in agreement.  Additionally, the patient was encouraged to complete his prescribed course of antibiotics in regards to the incidentally discovered intramural diverticular abscess within the sigmoid colon.  The patient and the patient's wife were encouraged to call the interventional radiology clinic with any future questions or concerns.  Thank you for this interesting consult.  I greatly enjoyed meeting Jordan Mccullough and look forward to participating in their care.  A copy of this report was sent to the requesting provider on this date.  Electronically Signed: Sandi Mariscal 08/02/2016, 4:55 PM   I spent a total of 15 Minutes in face to face in clinical consultation, greater than 50% of which was counseling/coordinating care for endoleak post endovascular repair of abdominal aortic aneurysm

## 2016-08-08 ENCOUNTER — Telehealth: Payer: Self-pay | Admitting: Family Medicine

## 2016-08-08 NOTE — Telephone Encounter (Signed)
-----   Message from Katha Hamming sent at 08/03/2016 11:07 AM EST ----- Regarding: ct abd/pelvis I called to schedule Jordan Mccullough for his CT yesterday and spoke to his wife.  She was going to talk to the patient.   If he decides to have the CT, he will need to be premedicated for the IV contrast according to [facility] protocol.  He is allergic to iodinated diagnostic agents.  Thanks, Kathlene November [facility] - imaging

## 2016-08-09 ENCOUNTER — Ambulatory Visit: Payer: Medicare Other | Admitting: Neurology

## 2016-08-09 ENCOUNTER — Encounter: Payer: Self-pay | Admitting: Surgery

## 2016-08-15 ENCOUNTER — Encounter: Payer: Self-pay | Admitting: Neurology

## 2016-08-15 ENCOUNTER — Ambulatory Visit (INDEPENDENT_AMBULATORY_CARE_PROVIDER_SITE_OTHER): Payer: Medicare Other | Admitting: Neurology

## 2016-08-15 VITALS — BP 136/84 | HR 97 | Ht 64.0 in | Wt 147.4 lb

## 2016-08-15 DIAGNOSIS — G40209 Localization-related (focal) (partial) symptomatic epilepsy and epileptic syndromes with complex partial seizures, not intractable, without status epilepticus: Secondary | ICD-10-CM | POA: Diagnosis not present

## 2016-08-15 DIAGNOSIS — C7931 Secondary malignant neoplasm of brain: Secondary | ICD-10-CM

## 2016-08-15 MED ORDER — LEVETIRACETAM ER 500 MG PO TB24: ORAL_TABLET | ORAL | 3 refills | Status: DC

## 2016-08-15 MED ORDER — LAMOTRIGINE ER 25 MG PO TB24: ORAL_TABLET | ORAL | 0 refills | Status: DC

## 2016-08-15 NOTE — Progress Notes (Signed)
NEUROLOGY FOLLOW UP OFFICE NOTE  Jordan Mccullough [id number]  HISTORY OF PRESENT ILLNESS: I had the pleasure of seeing Jordan Mccullough in follow-up in the neurology clinic on 08/15/2016. The patient was last seen 4 months ago after he had a new onset convulsive seizure last 08/04/15 and was found to have brain metastases to the right frontal lobe. He was started on Keppra and initially had some difficulties tolerating the medication, but felt better on low dose extended-release Keppra '500mg'$  daily. He had been seizure-free for almost 7 months until he had focal motor seizures affecting the left side of his face at the beginning of July 2017. Keppra dose was increased to '1000mg'$  qhs. Since his last visit, he has noticed occasional twitching in his left thumb lasting 30 seconds ("feels like a rubber band"). He had numbness on his left cheek with slight twitching lasting less than 30 seconds a week ago. He is always drowsy and still feels sleepy and tired even if he takes a nap. He usually sleeps 9-10 hours every night. He has noticed that he is not as patient and gets cranky with his grandchildren. He has pain on his right shoulder that wakes him up at night sometimes. I personally reviewed last MRI brain with and without contrast done 07/20/16, with stable appearance of the 2 treated cystic lesions in the right parietal lobe. No evidence of tumor growth. Slightly less right hemispheric edema. No new lesion.  He denies any headaches, dizziness, diplopia, dysarthria, dysphagia, olfactory/gustatory hallucinations, myoclonic jerks.   HPI 10/11/15: This is a pleasant 75 yo RH man with a history of lung cancer s/p lobectomy, chemotherapy and radiation, hyperlipidemia, CAD s/p MI, peripheral vascular disease, abdominal aortic aneurysm, admitted to [facility] last 08/04/15 after a new onset seizure and found to have brain metastases. He states he got his flu shot then went to work, got home and started having a beer the threw  up, his eyes got blurry to the point he could not see anything, then has no recollection of events. According to ER notes, after the nausea and vomiting, he decided to drink beer to feel better, his wife left the room then came back to find him with arms extended, shaking, eyes rolled back lasting 5-10 seconds. He recalls coming to without feeling confused, no focal weakness. He was brought to [facility] ER where CT head showed a 1.6 x 1.5 cm hyperdense mass in the right frontal lobe worrisome for focal metastases. MRI was done showing 2 enhancing masses in the right frontal lobe measuring up to 2 x 1.7 cm, no mass effect. He was started on Keppra. He underwent stereotactic radiation.   He called our office in July 2017 to report facial seizures. He had 5 in one day lasting 5-6 seconds, then stopped. His wife called our office to report 4 facial seizures on 7/19 and one on 7/20, these were lasting 5-6 seconds, he could feel it coming from the left jawline going up to his forehead, with facial twitching seen. No associated headache or confusion. He has a constant feeling of numbness on the left side of his face, "like you had Novocaine." He continues to have left arm and leg weakness that has been chronic, no twitching involved. He feels he has a hard time controlling them. He denies any falls. Keppra dose was increased to '1000mg'$  qhs.  He denies any prior history of seizures. His wife denies any staring/unresponsive episodes, no gaps in time, olfactory/gustatory hallucinations, deja vu,  rising epigastric sensation, focal numbness/tingling/weakness, myoclonic jerks, headaches, dysarthria/dysphagia, bowel/bladder dysfunction. He was discharged home on Decadron and Keppra, and reports that he was "just totally out of it," functioning only 50%. He felt significantly better getting of Decadron, but continued to have generalized weakness, particularly in the legs, blurred vision, and reported this to his PCP Dr. Anitra Lauth.  Keppra dose was reduced to '250mg'$  BID 5 days ago, and he reports that symptoms are better, but still there. He states that he wakes up feeling fine, but then starts feeling the symptoms after taking morning Keppra dose. He has mild 2-3 over 10 back pain when walking, relieved when he sits down. He continues to drink beer daily but has reduced to 1-2 a day from 3-5 beers a day. His wife reports he would drink 3 beer cases in a week.  Epilepsy Risk Factors: Brain metastases in the right frontal lobe. Otherwise he had a normal birth and early development. There is no history of febrile convulsions, CNS infections such as meningitis/encephalitis, significant traumatic brain injury, neurosurgical procedures, or family history of seizures.   I personally reviewed MRI brain with and without contrast done 08/12/15 which showed an 8x67m centrally necrotic metastasis in the right posterior frontal lobe; there is a centrally necrotic mass measuring 116mat the right vertex frontoparietal junction with regional vasogenic edema. Routine EEG 08/05/15 was a normal wake and sleep study  PAST MEDICAL HISTORY: Past Medical History:  Diagnosis Date  . AAA (abdominal aortic aneurysm) ([place]  . Asymptomatic cholelithiasis 07/2015   Incidental finding on PET CT  . Back pain 04/19/2016  . Coronary artery disease    MI 1992, S/P  PTCA; negative stress test in November 2011 with no ischemia.   . Diverticulosis   . Hearing loss    Bilateral   . History of radiation therapy 11/10/13-12/12/13   lung,50Gy/2531f. Hyperlipidemia    Crestor= myalgias  . Hypertension   . Lung cancer ([facility]   non-small cell;  L upper lobectomy with mediastinal LN dissection, chemo, and radiation in 2014/2015. Remission until pt had questionable seizure 07/2015--MRI showed brain mets; palliative brain rad (stereotactic radiation therapy) started 08/25/15.  CT C/A/P clear 02/2016 and 05/2016.  Plan per onc is to repeat in 6 mo.  .  Malignant neoplasm of upper lobe, left bronchus or lung ([facility]CC[place]/06/2014  . Malignant neoplasm of upper lobe, left bronchus or lung ([facility]CCB[facility]1/01/2013  . Myocardial infarction 1992   Dr McAAngelena Form . Peripheral vascular disease ([place]CC[place]1/2014   4.8x4.6 cm infrarenal abdominal aortic fusiform aneurysm, 1.5 cm right common iliac artery aneurysm.  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  . [facility]   Stereotactic radiation therapy: frontoparietal 18gy,posterior frontal lobe 20gy,  . Seizure disorder (HCCDougherty016/2017   as sequela of brain mets;  Grand mal seizure 07/2015, then got on keppra and was seizure-free until focal motor seizures of L side of face began 02/2016- these responded well to up-titration of keppra.  . Shortness of breath   . Tobacco dependence    "Quit" 1992, but pt has smoked "on and off" since that time    MEDICATIONS: Current Outpatient Prescriptions on File Prior to Visit  Medication Sig Dispense Refill  . amLODipine (NORVASC) 5 MG tablet TAKE 1 TABLET (5 MG TOTAL) BY MOUTH DAILY. 90 tablet 3  . aspirin  EC 81 MG tablet Take 81 mg by mouth daily.    Marland Kitchen atorvastatin (LIPITOR) 20 MG tablet Take 1 tablet (20 mg total) by mouth daily. 30 tablet 3  . budesonide-formoterol (SYMBICORT) 160-4.5 MCG/ACT inhaler Inhale 1-2 puffs into the lings every 12 hours. Gargle and Spit after use. 10.2 Inhaler 3  . Glucosamine-Chondroit-Vit C-Mn (GLUCOSAMINE 1500 COMPLEX PO) Take 1,500 mg by mouth daily.     Marland Kitchen levETIRAcetam (KEPPRA XR) 500 MG 24 hr tablet Take 2 tablet every night 180 tablet 3  . metoprolol (LOPRESSOR) 50 MG tablet Take 1 tablet (50 mg total) by mouth 2 (two) times daily. 180 tablet 3  . Multiple Vitamin (MULTIVITAMIN) tablet Take 1 tablet by mouth every evening.     . pentoxifylline (TRENTAL) 400 MG CR tablet Take 1 tablet (400 mg total) by mouth 3 (three) times  daily. 90 tablet 5  . vitamin E 1000 UNIT capsule Take 1 capsule (1,000 Units total) by mouth daily. 30 capsule 12   No current facility-administered medications on file prior to visit.     ALLERGIES: Allergies  Allergen Reactions  . Fluvirin [Influenza Vac Typ A&B Surf Ant] Nausea And Vomiting and Other (See Comments)    Seizure like activity?  Marland Kitchen Rosuvastatin Other (See Comments)    Myalgias and dark urine  . Iodinated Diagnostic Agents Hives    1 hive on lt cheek lasting approximately 1 hour on last 2 CT per pt; needs pre meds in future; 50 mg benadryl po 1 hr prior to exam per Dr. Weber Cooks    FAMILY HISTORY: Family History  Problem Relation Age of Onset  . Hypertension Mother   . Prostate cancer Father     Prostate cancer  . Cancer Father     Brain Tumor  . Lung cancer Sister     NON SMOKER  . Cancer Sister   . Hyperlipidemia Sister   . Hyperlipidemia Brother   . Heart attack Brother   . Hypertension Brother   . Diabetes Neg Hx   . Stroke Neg Hx     SOCIAL HISTORY: Social History   Social History  . Marital status: Married    Spouse name: N/A  . Number of children: N/A  . Years of education: N/A   Occupational History  . Retired Risk analyst    Social History Main Topics  . Smoking status: Current Every Day Smoker    Packs/day: 1.00    Years: 24.00    Types: Cigarettes    Last attempt to quit: 05/28/2013  . Smokeless tobacco: Never Used     Comment: QUIT 15 YEARS AGO  . Alcohol use 9.0 oz/week    3 Glasses of wine, 12 Cans of beer per week     Comment: 1-2 beers a day  . Drug use: No  . Sexual activity: Not on file   Other Topics Concern  . Not on file   Social History Narrative   HEART HEALTHY DIET.     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   MARRIED, 2 children who live in [place].  Smoked on and off since then.   ALCOHOL USE -YES- RED WINE SOCIALLY, couple of beers at night usually.    REVIEW OF SYSTEMS: Constitutional: No  fevers, chills, or sweats, + generalized fatigue, change in appetite Eyes: No visual changes, double vision, eye pain Ear, nose and throat: No hearing loss, ear pain, nasal congestion, sore throat Cardiovascular: No chest pain, palpitations Respiratory:  No shortness of breath at rest or with exertion, wheezes GastrointestinaI: No nausea, vomiting, diarrhea, abdominal pain, fecal incontinence Genitourinary:  No dysuria, urinary retention or frequency Musculoskeletal:  No neck pain, +back pain Integumentary: No rash, pruritus, skin lesions Neurological: as above Psychiatric: No depression, insomnia, anxiety Endocrine: No palpitations, fatigue, diaphoresis, mood swings, change in appetite, change in weight, increased thirst Hematologic/Lymphatic:  No anemia, purpura, petechiae. Allergic/Immunologic: no itchy/runny eyes, nasal congestion, recent allergic reactions, rashes  PHYSICAL EXAM: Vitals:   08/15/16 1456  BP: 136/84  Pulse: 97   General: No acute distress Head:  Normocephalic/atraumatic Neck: supple, no paraspinal tenderness, full range of motion Heart:  Regular rate and rhythm Lungs:  Clear to auscultation bilaterally Back: No paraspinal tenderness Skin/Extremities: No rash, no edema Neurological Exam: alert and oriented to person, place, and time. No aphasia or dysarthria. Fund of knowledge is appropriate.  Recent and remote memory are intact.  Attention and concentration are normal.    Able to name objects and repeat phrases. Cranial nerves: Pupils equal, round, reactive to light. Extraocular movements intact with no nystagmus. Visual fields full. Facial sensation intact. No facial asymmetry. Tongue, uvula, palate midline.  Motor: Bulk and tone normal, muscle strength 5/5 throughout with no pronator drift, orbits around the left arm (similar to prior).  Sensation to light touch intact.  No extinction to double simultaneous stimulation.  Deep tendon reflexes 2+ throughout, toes  downgoing.  Finger to nose testing intact.  Gait slow and cautious, no ataxia, uses cane mostly for balance.   IMPRESSION: This is a pleasant 75 yo RH man with a history of lung cancer s/p lobectomy, radiation, chemotherapy, found to have 2 right frontal brain metastases after he had a convulsive seizure on 08/04/15. Routine EEG normal. He started having focal motor seizures affecting the face in July and Keppra dose was increased. Since his last visit, he reports occasional twitching of his right thumb, as well as a brief episode of right facial numbness and slight twitching last week. He is already drowsy on current dose of Keppra and feels he would not tolerate increasing dose further. He is agreeable to switching to Lamotrigine, side effects were discussed, including Kathreen Cosier syndrome. Start Lamotrigine ER '25mg'$  daily for 2 weeks, then increase every 2 weeks to '100mg'$  daily, at which point he will follow-up and we will start tapering down Keppra. He is aware of [place] driving laws to stop driving after a seizure, until 6 months seizure-free. He will follow-up in 6 weeks and knows to call for any problems.  Thank you for allowing me to participate in his care.  Please do not hesitate to call for any questions or concerns.  The duration of this appointment visit was 25 minutes of face-to-face time with the patient.  Greater than 50% of this time was spent in counseling, explanation of diagnosis, planning of further management, and coordination of care.   Ellouise Newer, M.D.   CC: Dr. Anitra Lauth, Dr. Sherwood Gambler, Dr. Julien Nordmann

## 2016-08-15 NOTE — Patient Instructions (Signed)
1. Continue Keppra XR '1000mg'$  daily  2. Start Lamotrigine ER '25mg'$ : take 1 tablet daily for 2 weeks, then increase to 2 tablets daily for 2 weeks, then increase to 4 tablets daily and continue until your follow-up. 3. Follow-up on February 8 at   Seizure Precautions: 1. If medication has been prescribed for you to prevent seizures, take it exactly as directed.  Do not stop taking the medicine without talking to your doctor first, even if you have not had a seizure in a long time.   2. Avoid activities in which a seizure would cause danger to yourself or to others.  Don't operate dangerous machinery, swim alone, or climb in high or dangerous places, such as on ladders, roofs, or girders.  Do not drive unless your doctor says you may.  3. If you have any warning that you may have a seizure, lay down in a safe place where you can't hurt yourself.    4.  No driving for 6 months from last seizure, as per [facility].   Please refer to the following link on the [place] [employer] website for more information: [email or web address]   5.  Maintain good sleep hygiene. Avoid alcohol.  6.  Contact your doctor if you have any problems that may be related to the medicine you are taking.  7.  Call 911 and bring the patient back to the ED if:        A.  The seizure lasts longer than 5 minutes.       B.  The patient doesn't awaken shortly after the seizure  C.  The patient has new problems such as difficulty seeing, speaking or moving  D.  The patient was injured during the seizure  E.  The patient has a temperature over 102 F (39C)  F.  The patient vomited and now is having trouble breathing

## 2016-08-18 ENCOUNTER — Telehealth: Payer: Self-pay

## 2016-08-18 DIAGNOSIS — G40209 Localization-related (focal) (partial) symptomatic epilepsy and epileptic syndromes with complex partial seizures, not intractable, without status epilepticus: Secondary | ICD-10-CM

## 2016-08-18 MED ORDER — LAMOTRIGINE 25 MG PO TABS: ORAL_TABLET | ORAL | 0 refills | Status: DC

## 2016-08-18 NOTE — Telephone Encounter (Signed)
Notified patient Lamotrigine ER is not approved with insurance. Per Dr. Delice Lesch we can start Lamotrigine '25mg'$  and see how it works for patient. Patient agrees.

## 2016-08-21 ENCOUNTER — Encounter: Payer: Self-pay | Admitting: Neurology

## 2016-08-21 ENCOUNTER — Encounter: Payer: Self-pay | Admitting: Family Medicine

## 2016-08-25 ENCOUNTER — Encounter: Payer: Self-pay | Admitting: Interventional Radiology

## 2016-09-01 ENCOUNTER — Encounter: Payer: Self-pay | Admitting: Neurology

## 2016-09-02 ENCOUNTER — Other Ambulatory Visit: Payer: Self-pay | Admitting: Family Medicine

## 2016-09-04 NOTE — Telephone Encounter (Signed)
RF request for amlodipine LOV: 01/03/16 Next ov: 09/27/16 Last written: 10/18/15 #90 w/ 3RF

## 2016-09-05 NOTE — Telephone Encounter (Signed)
Can this order for CT be canceled?

## 2016-09-14 ENCOUNTER — Other Ambulatory Visit: Payer: Self-pay | Admitting: Radiation Therapy

## 2016-09-14 DIAGNOSIS — C7949 Secondary malignant neoplasm of other parts of nervous system: Secondary | ICD-10-CM

## 2016-09-14 DIAGNOSIS — C7931 Secondary malignant neoplasm of brain: Secondary | ICD-10-CM

## 2016-09-15 ENCOUNTER — Other Ambulatory Visit: Payer: Self-pay | Admitting: Neurology

## 2016-09-15 NOTE — Telephone Encounter (Signed)
RX refill sent to pharmacy. Per last OV note patient to continue.

## 2016-09-16 ENCOUNTER — Other Ambulatory Visit: Payer: Self-pay | Admitting: Family Medicine

## 2016-09-24 ENCOUNTER — Encounter: Payer: Self-pay | Admitting: Family Medicine

## 2016-09-27 ENCOUNTER — Ambulatory Visit (INDEPENDENT_AMBULATORY_CARE_PROVIDER_SITE_OTHER): Payer: Medicare Other | Admitting: Family Medicine

## 2016-09-27 ENCOUNTER — Encounter: Payer: Self-pay | Admitting: Family Medicine

## 2016-09-27 VITALS — BP 145/89 | HR 69 | Temp 97.7°F | Resp 16 | Ht 64.0 in | Wt 145.2 lb

## 2016-09-27 DIAGNOSIS — E7800 Pure hypercholesterolemia, unspecified: Secondary | ICD-10-CM

## 2016-09-27 LAB — LIPID PANEL
Cholesterol: 146 mg/dL (ref 0–200)
HDL: 45 mg/dL (ref >39.00)
LDL Cholesterol: 84 mg/dL (ref 0–99)
NonHDL: 101.43
Total CHOL/HDL Ratio: 3
Triglycerides: 88 mg/dL (ref 0.0–149.0)
VLDL: 17.6 mg/dL (ref 0.0–40.0)

## 2016-09-27 NOTE — Progress Notes (Signed)
Pre visit review using our clinic review tool, if applicable. No additional management support is needed unless otherwise documented below in the visit note. 

## 2016-09-27 NOTE — Progress Notes (Signed)
OFFICE VISIT  09/27/2016   CC:  Chief Complaint  Patient presents with  . Follow-up    HLD and History of Statin induced myalgia, pt is fasting.    HPI:    Patient is a 76 y.o. Caucasian male who presents for 2 mo f/u hyperlipidemia with hx of statin intolerance/myalgias.  About 2 mo ago we started a trial of '20mg'$  atorvastatin qd. He has no side effects from the med.  Feels well.  He is fasting today.  Last CT 07/28/16 (abd/pelv) showed small area in colon wall suspicious for infection/abscess. Treated pt for possible diverticulitis (pt had some lower abd pain on and off at that time--mild). We had planned on f/u CT but after abx pt has had no further lower abd pains and is eating and drinking fine.  We decided today to hold off on repeat CT abd to recheck this.    Past Medical History:  Diagnosis Date  . AAA (abdominal aortic aneurysm) ([place])   . Asymptomatic cholelithiasis 07/2015   Incidental finding on PET CT  . Back pain 04/19/2016  . Coronary artery disease    MI 1992, S/P  PTCA; negative stress test in November 2011 with no ischemia.   . Diverticulosis   . Hearing loss    Bilateral   . History of radiation therapy 11/10/13-12/12/13   lung,50Gy/78f  . Hyperlipidemia    Crestor= myalgias  . Hypertension   . Lung cancer ([place] 05/2013   non-small cell;  L upper lobectomy with mediastinal LN dissection, chemo, and radiation in 2014/2015. Remission until pt had questionable seizure 07/2015--MRI showed brain mets; palliative brain rad (stereotactic radiation therapy) started 08/25/15.  CT C/A/P clear 02/2016 and 05/2016.  Plan per onc is to repeat in 6 mo.  . Malignant neoplasm of upper lobe, left bronchus or lung ([place] 10/29/2013  . Malignant neoplasm of upper lobe, left bronchus or lung ([place] 06/26/2013  . Myocardial infarction 1992   Dr MAngelena Form   . Peripheral vascular disease ([place] 08/2012   4.8x4.6 cm infrarenal abdominal aortic fusiform aneurysm, 1.5 cm right common iliac artery  aneurysm.  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  . [facility]   Stereotactic radiation therapy: frontoparietal 18gy,posterior frontal lobe 20gy,  . Seizure disorder ([place] 2016/2017   as sequela of brain mets;  Grand mal seizure 07/2015, then got on keppra and was seizure-free until focal motor seizures of L side of face began 02/2016- these responded well to up-titration of keppra but eventually pt had to be switched over to lamictal 07/2016.  .Marland KitchenShortness of breath   . Tobacco dependence    "Quit" 1992, but pt has smoked "on and off" since that time    Past Surgical History:  Procedure Laterality Date  . ABDOMINAL AORTIC ENDOVASCULAR STENT GRAFT N/A 05/07/2014   Procedure: ABDOMINAL AORTIC ENDOVASCULAR STENT GRAFT;  Surgeon: VSerafina Mitchell MD;  [facility];  Service: Vascular;  Laterality: N/A;  . Carotid duplex dopplers  07/2015   1-39% on R, no signif dz noted on L  . COLONOSCOPY W/ POLYPECTOMY  2003    negative 2010,due 2020; Dr BOlevia Perches . CORONARY ANGIOPLASTY     no stents  . CYSTOSCOPY/RETROGRADE/URETEROSCOPY Bilateral 10/09/2012   Procedure: BILATERAL RETROGRADE bladder and urethral BIOPSY ;  Surgeon: DMolli Hazard MD;  [facility];  Service: Urology;  Laterality: Bilateral;  BILATERAL RETROGRADE   . EEG  08/05/15   Pt placed on keppra just prior to this test due to having ? seizure (MRI showed brain mets)  . HERNIA REPAIR Bilateral    Inguinal  . INGUINAL HERIIORRHAPHY BILATERALLY    . IR GENERIC HISTORICAL  07/20/2016   IR RADIOLOGIST EVAL & MGMT 07/20/2016 [facility] INTERV RAD  . IR GENERIC HISTORICAL  08/02/2016   IR RADIOLOGIST EVAL & MGMT 08/02/2016 Sandi Mariscal, MD [facility] INTERV RAD  . MEDIASTINOSCOPY N/A 05/01/2013   Procedure: MEDIASTINOSCOPY;  Surgeon: Gaye Pollack, MD;  Location: [facility] OR;  Service: Thoracic;  Laterality: N/A;  . PFTs   04/2013   Minimal obstructive airway disease  . PILONIDAL CYST EXCISION    . PROSTATE BIOPSY N/A 10/09/2012   Procedure: PROSTATIC URETHRAL BIOPSY;  Surgeon: Molli Hazard, MD;  Location: [facility];  Service: Urology;  Laterality: N/A;  PROSTATIC URETHRAL BIOPSY   . PTCA  1992  . ROTATOR CUFF REPAIR     Bilateral  . THOROCOTOMY WITH LOBECTOMY Left 05/29/2013   Procedure: LEFT THOROCOTOMY WITH LEFT UPPER LOBE LOBECTOMY;  Surgeon: Gaye Pollack, MD;  Location: [place];  Service: Thoracic;  Laterality: Left;  Marland Kitchen VIDEO BRONCHOSCOPY N/A 05/01/2013   Procedure: VIDEO BRONCHOSCOPY;  Surgeon: Gaye Pollack, MD;  Location: [facility] OR;  Service: Thoracic;  Laterality: N/A;    Outpatient Medications Prior to Visit  Medication Sig Dispense Refill  . amLODipine (NORVASC) 5 MG tablet TAKE 1 TABLET (5 MG TOTAL) BY MOUTH DAILY. 90 tablet 3  . aspirin EC 81 MG tablet Take 81 mg by mouth daily.    Marland Kitchen atorvastatin (LIPITOR) 20 MG tablet Take 1 tablet (20 mg total) by mouth daily. 30 tablet 3  . budesonide-formoterol (SYMBICORT) 160-4.5 MCG/ACT inhaler Inhale 1-2 puffs into the lings every 12 hours. Gargle and Spit after use. 10.2 Inhaler 3  . Glucosamine-Chondroit-Vit C-Mn (GLUCOSAMINE 1500 COMPLEX PO) Take 1,500 mg by mouth daily.     Marland Kitchen lamoTRIgine (LAMICTAL) 25 MG tablet TAKE 1 TABLET BY MOUTH X 2 WEEKS, THEN 1 TABLET TWICE A DAY X 2 WEEKS, THEN 2 TABLETS TWICE A DAY (Patient taking differently: take 2 tablets 2 times daily) 120 tablet 0  . levETIRAcetam (KEPPRA XR) 500 MG 24 hr tablet Take 2 tablet every night 60 tablet 3  . metoprolol (LOPRESSOR) 50 MG tablet Take 1 tablet (50 mg total) by mouth 2 (two) times daily. 180 tablet 3  . Multiple Vitamin (MULTIVITAMIN) tablet Take 1 tablet by mouth every evening.     . pentoxifylline (TRENTAL) 400 MG CR tablet Take 1 tablet (400 mg total) by mouth 3 (three) times daily. 90 tablet 5  . vitamin E 1000 UNIT capsule Take 1 capsule (1,000 Units total) by mouth daily. 30  capsule 12  . amLODipine (NORVASC) 5 MG tablet TAKE 1 TABLET (5 MG TOTAL) BY MOUTH DAILY. (Patient not taking: Reported on 09/27/2016) 90 tablet 0   No facility-administered medications prior to visit.     Allergies  Allergen Reactions  . Fluvirin [Influenza Vac Typ A&B Surf Ant] Nausea And Vomiting and Other (See Comments)    Seizure like activity?  Marland Kitchen Rosuvastatin Other (See Comments)    Myalgias and dark urine  . Iodinated Diagnostic Agents Hives    1 hive on lt cheek lasting approximately 1 hour on last 2 CT per pt; needs pre meds in future; 50 mg benadryl po 1 hr prior to exam per Dr. Weber Cooks  ROS As per HPI  PE: Blood pressure (!) 145/89, pulse 69, temperature 97.7 F (36.5 C), temperature source Oral, resp. rate 16, height '5\' 4"'$  (1.626 m), weight 145 lb 4 oz (65.9 kg), SpO2 98 %. Gen: Alert, well appearing.  Patient is oriented to person, place, time, and situation. AFFECT: pleasant, lucid thought and speech. No further exam today.  LABS:  Lab Results  Component Value Date   TSH 0.708 08/05/2015   Lab Results  Component Value Date   WBC 9.8 05/26/2016   HGB 15.7 05/26/2016   HCT 47.4 05/26/2016   MCV 85.6 05/26/2016   PLT 219 05/26/2016   Lab Results  Component Value Date   CKTOTAL 115 07/27/2016    Lab Results  Component Value Date   CREATININE 0.80 07/28/2016   BUN 10.6 05/26/2016   NA 143 05/26/2016   K 5.2 (H) 05/26/2016   CL 101 09/16/2015   CO2 27 05/26/2016   Lab Results  Component Value Date   ALT 14 07/27/2016   AST 14 07/27/2016   ALKPHOS 58 07/27/2016   BILITOT 0.4 07/27/2016   Lab Results  Component Value Date   CHOL 189 07/27/2016   Lab Results  Component Value Date   HDL 46.10 07/27/2016   Lab Results  Component Value Date   LDLCALC 119 (H) 07/27/2016   Lab Results  Component Value Date   TRIG 123.0 07/27/2016   Lab Results  Component Value Date   CHOLHDL 4 07/27/2016   Lab Results  Component Value Date   PSA 13.43  (H) 08/13/2013   PSA 6.47 (H) 07/03/2012   PSA 4.43 (H) 08/09/2011   Lab Results  Component Value Date   HGBA1C 5.7 07/04/2013   IMPRESSION AND PLAN:  1) Hyperlipidemia; tolerating '20mg'$  qd atorvastatin.  Recheck FLP today. AST/ALT normal 07/2016.  2) Mild diverticulitis, ? Small intramural abscess noted on CT 07/2016, but pt insists he feels completely fine (mild lower abd sx's abated completely after a course of cipro and flagyl) and he declines to get a repeat of the CT as recommended by radiologist.  An After Visit Summary was printed and given to the patient.  FOLLOW UP: Return in about 6 months (around 03/27/2017) for routine chronic illness f/u.  Signed:  Crissie Sickles, MD           09/27/2016

## 2016-09-27 NOTE — Telephone Encounter (Signed)
Yes. Thx.

## 2016-09-28 ENCOUNTER — Encounter: Payer: Self-pay | Admitting: Neurology

## 2016-09-28 ENCOUNTER — Ambulatory Visit (INDEPENDENT_AMBULATORY_CARE_PROVIDER_SITE_OTHER): Payer: Medicare Other | Admitting: Neurology

## 2016-09-28 VITALS — BP 138/80 | HR 72 | Ht 64.0 in | Wt 145.3 lb

## 2016-09-28 DIAGNOSIS — G40209 Localization-related (focal) (partial) symptomatic epilepsy and epileptic syndromes with complex partial seizures, not intractable, without status epilepticus: Secondary | ICD-10-CM | POA: Diagnosis not present

## 2016-09-28 DIAGNOSIS — C7931 Secondary malignant neoplasm of brain: Secondary | ICD-10-CM | POA: Diagnosis not present

## 2016-09-28 MED ORDER — LAMOTRIGINE 100 MG PO TABS: ORAL_TABLET | ORAL | 11 refills | Status: DC

## 2016-09-28 NOTE — Progress Notes (Signed)
NEUROLOGY FOLLOW UP OFFICE NOTE  STACIE KNUTZEN [id number]  HISTORY OF PRESENT ILLNESS: I had the pleasure of seeing Jordan Mccullough in follow-up in the neurology clinic on 09/28/2016. The patient was last seen 6 week ago after he had a new onset convulsive seizure last 08/04/15 and was found to have brain metastases to the right frontal lobe. He was started on Keppra and initially had some difficulties tolerating the medication, but felt better on low dose extended-release Keppra '500mg'$  daily. He had been seizure-free for almost 7 months until he had focal motor seizures affecting the left side of his face at the beginning of July 2017. Keppra dose was increased to '1000mg'$  qhs. On his last visit, he continued to report occasional twitching in his left thumb lasting 30 seconds ("feels like a rubber band") and numbness on his left cheek with slight twitching lasting less than 30 seconds. Because he was reporting drowsiness and sleepiness even after naps, we agreed not to increase Keppra further, and start a different AED. He was started on Lamotrigine, currently taking '50mg'$  BID without side effects. He is still tired and weak on his left side, but he feels more alert, and he has not had any further twitching or facial symptoms. He has noticed he wakes up every 3 hours at night.  He denies any headaches, dizziness, diplopia, dysarthria, dysphagia, olfactory/gustatory hallucinations, myoclonic jerks.   MRI brain with and without contrast done 07/20/16 showed stable appearance of the 2 treated cystic lesions in the right parietal lobe. No evidence of tumor growth. Slightly less right hemispheric edema. No new lesion.  HPI 10/11/15: This is a pleasant 76 yo RH man with a history of lung cancer s/p lobectomy, chemotherapy and radiation, hyperlipidemia, CAD s/p MI, peripheral vascular disease, abdominal aortic aneurysm, admitted to [facility] last 08/04/15 after a new onset seizure and found to have brain metastases. He  states he got his flu shot then went to work, got home and started having a beer the threw up, his eyes got blurry to the point he could not see anything, then has no recollection of events. According to ER notes, after the nausea and vomiting, he decided to drink beer to feel better, his wife left the room then came back to find him with arms extended, shaking, eyes rolled back lasting 5-10 seconds. He recalls coming to without feeling confused, no focal weakness. He was brought to [facility] ER where CT head showed a 1.6 x 1.5 cm hyperdense mass in the right frontal lobe worrisome for focal metastases. MRI was done showing 2 enhancing masses in the right frontal lobe measuring up to 2 x 1.7 cm, no mass effect. He was started on Keppra. He underwent stereotactic radiation.   He called our office in July 2017 to report facial seizures. He had 5 in one day lasting 5-6 seconds, then stopped. His wife called our office to report 4 facial seizures on 7/19 and one on 7/20, these were lasting 5-6 seconds, he could feel it coming from the left jawline going up to his forehead, with facial twitching seen. No associated headache or confusion. He has a constant feeling of numbness on the left side of his face, "like you had Novocaine." He continues to have left arm and leg weakness that has been chronic, no twitching involved. He feels he has a hard time controlling them. He denies any falls. Keppra dose was increased to '1000mg'$  qhs.  He denies any prior history of seizures. His  wife denies any staring/unresponsive episodes, no gaps in time, olfactory/gustatory hallucinations, deja vu, rising epigastric sensation, focal numbness/tingling/weakness, myoclonic jerks, headaches, dysarthria/dysphagia, bowel/bladder dysfunction. He was discharged home on Decadron and Keppra, and reports that he was "just totally out of it," functioning only 50%. He felt significantly better getting of Decadron, but continued to have generalized  weakness, particularly in the legs, blurred vision, and reported this to his PCP Dr. Anitra Lauth. Keppra dose was reduced to '250mg'$  BID 5 days ago, and he reports that symptoms are better, but still there. He states that he wakes up feeling fine, but then starts feeling the symptoms after taking morning Keppra dose. He has mild 2-3 over 10 back pain when walking, relieved when he sits down. He continues to drink beer daily but has reduced to 1-2 a day from 3-5 beers a day. His wife reports he would drink 3 beer cases in a week.  Epilepsy Risk Factors: Brain metastases in the right frontal lobe. Otherwise he had a normal birth and early development. There is no history of febrile convulsions, CNS infections such as meningitis/encephalitis, significant traumatic brain injury, neurosurgical procedures, or family history of seizures.   I personally reviewed MRI brain with and without contrast done 08/12/15 which showed an 8x24m centrally necrotic metastasis in the right posterior frontal lobe; there is a centrally necrotic mass measuring 162mat the right vertex frontoparietal junction with regional vasogenic edema. Routine EEG 08/05/15 was a normal wake and sleep study  PAST MEDICAL HISTORY: Past Medical History:  Diagnosis Date  . AAA [place]abdominal aortic aneurysm) ([place]  . Asymptomatic cholelithiasis 07/2015   Incidental finding on PET CT  . Back pain 04/19/2016  . Coronary artery disease    MI 1992, S/P  PTCA; negative stress test in November 2011 with no ischemia.   . Diverticulosis   . Hearing loss    Bilateral   . History of radiation therapy 11/10/13-12/12/13   lung,50Gy/2557f. Hyperlipidemia    Crestor= myalgias  . Hypertension   . Lung cancer ([place]   non-small cell;  L upper lobectomy with mediastinal LN dissection, chemo, and radiation in 2014/2015. Remission until pt had questionable seizure 07/2015--MRI showed brain mets; palliative brain rad (stereotactic radiation therapy)  started 08/25/15.  CT C/A/P clear 02/2016 and 05/2016.  Plan per onc is to repeat in 6 mo.  . Malignant neoplasm of upper lobe, left bronchus or lung ([facility]06/2014  . Malignant neoplasm of upper lobe, left bronchus or lung ([place]/01/2013  . Myocardial infarction 1992   Dr McAAngelena Form . Peripheral vascular disease ([place] [place]   4.8x4.6 cm infrarenal abdominal aortic fusiform aneurysm, 1.5 cm right common iliac artery aneurysm.  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  . [facility]   Stereotactic radiation therapy: frontoparietal 18gy,posterior frontal lobe 20gy,  . Seizure disorder ([place]/2017   as sequela of brain mets;  Grand mal seizure 07/2015, then got on keppra and was seizure-free until focal motor seizures of L side of face began 02/2016- these responded well to up-titration of keppra but eventually pt had to be switched over to lamictal 07/2016.  . SMarland Kitchenortness of breath   . Tobacco dependence    "Quit" 1992, but pt has smoked "on and off" since that time    MEDICATIONS: Current Outpatient Prescriptions on File Prior to Visit  Medication Sig  Dispense Refill  . amLODipine (NORVASC) 5 MG tablet TAKE 1 TABLET (5 MG TOTAL) BY MOUTH DAILY. 90 tablet 3  . aspirin EC 81 MG tablet Take 81 mg by mouth daily.    Marland Kitchen atorvastatin (LIPITOR) 20 MG tablet Take 1 tablet (20 mg total) by mouth daily. 30 tablet 3  . budesonide-formoterol (SYMBICORT) 160-4.5 MCG/ACT inhaler Inhale 1-2 puffs into the lings every 12 hours. Gargle and Spit after use. 10.2 Inhaler 3  . Glucosamine-Chondroit-Vit C-Mn (GLUCOSAMINE 1500 COMPLEX PO) Take 1,500 mg by mouth daily.     Marland Kitchen lamoTRIgine (LAMICTAL) 25 MG tablet TAKE 1 TABLET BY MOUTH X 2 WEEKS, THEN 1 TABLET TWICE A DAY X 2 WEEKS, THEN 2 TABLETS TWICE A DAY (Patient taking differently: take 2 tablets 2 times daily) 120 tablet 0  . levETIRAcetam  (KEPPRA XR) 500 MG 24 hr tablet Take 2 tablet every night 60 tablet 3  . metoprolol (LOPRESSOR) 50 MG tablet Take 1 tablet (50 mg total) by mouth 2 (two) times daily. 180 tablet 3  . Multiple Vitamin (MULTIVITAMIN) tablet Take 1 tablet by mouth every evening.     . pentoxifylline (TRENTAL) 400 MG CR tablet Take 1 tablet (400 mg total) by mouth 3 (three) times daily. 90 tablet 5  . vitamin E 1000 UNIT capsule Take 1 capsule (1,000 Units total) by mouth daily. 30 capsule 12   No current facility-administered medications on file prior to visit.     ALLERGIES: Allergies  Allergen Reactions  . Fluvirin [Influenza Vac Typ A&B Surf Ant] Nausea And Vomiting and Other (See Comments)    Seizure like activity?  Marland Kitchen Rosuvastatin Other (See Comments)    Myalgias and dark urine  . Iodinated Diagnostic Agents Hives    1 hive on lt cheek lasting approximately 1 hour on last 2 CT per pt; needs pre meds in future; 50 mg benadryl po 1 hr prior to exam per Dr. Weber Cooks    FAMILY HISTORY: Family History  Problem Relation Age of Onset  . Hypertension Mother   . Prostate cancer Father     Prostate cancer  . Cancer Father     Brain Tumor  . Lung cancer Sister     NON SMOKER  . Cancer Sister   . Hyperlipidemia Sister   . Hyperlipidemia Brother   . Heart attack Brother   . Hypertension Brother   . Diabetes Neg Hx   . Stroke Neg Hx     SOCIAL HISTORY: Social History   Social History  . Marital status: Married    Spouse name: N/A  . Number of children: N/A  . Years of education: N/A   Occupational History  . Retired Risk analyst    Social History Main Topics  . Smoking status: Current Every Day Smoker    Packs/day: 1.00    Years: 24.00    Types: Cigarettes    Last attempt to quit: 05/28/2013  . Smokeless tobacco: Never Used     Comment: QUIT 15 YEARS AGO  . Alcohol use 9.0 oz/week    3 Glasses of wine, 12 Cans of beer per week     Comment: 1-2 beers a day  . Drug use: No  . Sexual  activity: Not on file   Other Topics Concern  . Not on file   Social History Narrative   HEART HEALTHY DIET.     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   MARRIED, 2 children who live in [place].  Smoked on and off since then.   ALCOHOL USE -YES- RED WINE SOCIALLY, couple of beers at night usually.    REVIEW OF SYSTEMS: Constitutional: No fevers, chills, or sweats, + generalized fatigue, change in appetite Eyes: No visual changes, double vision, eye pain Ear, nose and throat: No hearing loss, ear pain, nasal congestion, sore throat Cardiovascular: No chest pain, palpitations Respiratory:  No shortness of breath at rest or with exertion, wheezes GastrointestinaI: No nausea, vomiting, diarrhea, abdominal pain, fecal incontinence Genitourinary:  No dysuria, urinary retention or frequency Musculoskeletal:  No neck pain, +back pain Integumentary: No rash, pruritus, skin lesions Neurological: as above Psychiatric: No depression, insomnia, anxiety Endocrine: No palpitations, fatigue, diaphoresis, mood swings, change in appetite, change in weight, increased thirst Hematologic/Lymphatic:  No anemia, purpura, petechiae. Allergic/Immunologic: no itchy/runny eyes, nasal congestion, recent allergic reactions, rashes  PHYSICAL EXAM: Vitals:   09/28/16 1203  BP: 138/80  Pulse: 72   General: No acute distress Head:  Normocephalic/atraumatic Neck: supple, no paraspinal tenderness, full range of motion Heart:  Regular rate and rhythm Lungs:  Clear to auscultation bilaterally Back: No paraspinal tenderness Skin/Extremities: No rash, no edema Neurological Exam: alert and oriented to person, place, and time. No aphasia or dysarthria. Fund of knowledge is appropriate.  Recent and remote memory are intact.  Attention and concentration are normal.    Able to name objects and repeat phrases. Cranial nerves: Pupils equal, round, reactive to light. Extraocular movements intact with no  nystagmus. Visual fields full. Facial sensation intact. No facial asymmetry. Tongue, uvula, palate midline.  Motor: Bulk and tone normal, muscle strength 5/5 throughout with no pronator drift, orbits around the left arm (similar to prior).  Sensation to light touch intact.  No extinction to double simultaneous stimulation.  Deep tendon reflexes 2+ throughout, toes downgoing.  Finger to nose testing intact.  Gait slow and cautious, no ataxia, uses cane mostly for balance.   IMPRESSION: This is a pleasant 76 yo RH man with a history of lung cancer s/p lobectomy, radiation, chemotherapy, found to have 2 right frontal brain metastases after he had a convulsive seizure on 08/04/15. Routine EEG normal. He started having focal motor seizures affecting the face in July and Keppra dose was increased. He continued to report occasional twitching of his right thumb, as well as a brief episode of right facial numbness and slight twitching, and could not tolerate higher doses of Keppra. He is tolerating addition of Lamotrigine and feels more alert. He will continue with uptitration to '100mg'$  BID. Once a this dose, he will start tapering down the Keppra every 2 weeks. He is aware of risks of breakthrough seizure with any medication adjustment. He is aware of [place] driving laws to stop driving after a seizure, until 6 months seizure-free. He will follow-up in 3 months and knows to call for any problems.  Thank you for allowing me to participate in his care.  Please do not hesitate to call for any questions or concerns.  The duration of this appointment visit was 25 minutes of face-to-face time with the patient.  Greater than 50% of this time was spent in counseling, explanation of diagnosis, planning of further management, and coordination of care.   Ellouise Newer, M.D.   CC: Dr. Anitra Lauth, Dr. Sherwood Gambler, Dr. Julien Nordmann

## 2016-09-28 NOTE — Patient Instructions (Signed)
1. We will increase Lamotrigine to '100mg'$  twice a day: With your current bottle of Lamotrigine '25mg'$ , take 4 tablets twice a day. Once done, your new bottle will be for Lamotrigine '100mg'$ , take 1 tablet twice a day 2. Start reducing Keppra '500mg'$ : Take 1 tablet daily for 2 weeks, then reduce to 1 tablet every other day for 2 weeks, then stop 3. Follow-up in 3 months, call for any changes  Seizure Precautions: 1. If medication has been prescribed for you to prevent seizures, take it exactly as directed.  Do not stop taking the medicine without talking to your doctor first, even if you have not had a seizure in a long time.   2. Avoid activities in which a seizure would cause danger to yourself or to others.  Don't operate dangerous machinery, swim alone, or climb in high or dangerous places, such as on ladders, roofs, or girders.  Do not drive unless your doctor says you may.  3. If you have any warning that you may have a seizure, lay down in a safe place where you can't hurt yourself.    4.  No driving for 6 months from last seizure, as per [facility].   Please refer to the following link on the [place] website for more information: http://www.epilepsyfoundation.org/answerplace/Social/driving/drivingu.cfm   5.  Maintain good sleep hygiene. Avoid alcohol.  6.  Contact your doctor if you have any problems that may be related to the medicine you are taking.  7.  Call 911 and bring the patient back to the ED if:        A.  The seizure lasts longer than 5 minutes.       B.  The patient doesn't awaken shortly after the seizure  C.  The patient has new problems such as difficulty seeing, speaking or moving  D.  The patient was injured during the seizure  E.  The patient has a temperature over 102 F (39C)  F.  The patient vomited and now is having trouble breathing

## 2016-10-03 ENCOUNTER — Other Ambulatory Visit: Payer: Self-pay | Admitting: *Deleted

## 2016-10-03 ENCOUNTER — Encounter: Payer: Self-pay | Admitting: Neurology

## 2016-10-03 MED ORDER — METOPROLOL TARTRATE 50 MG PO TABS
50.0000 mg | ORAL_TABLET | Freq: Two times a day (BID) | ORAL | 1 refills | Status: DC
Start: 2016-10-03 — End: 2017-03-28

## 2016-10-03 NOTE — Telephone Encounter (Signed)
Fax from [place].  RF request for metoprolol LOV: 09/27/16 Next ov: None Last written: 10/18/15 #180 w/ 3RF

## 2016-10-05 ENCOUNTER — Encounter: Payer: Self-pay | Admitting: Family Medicine

## 2016-10-18 ENCOUNTER — Other Ambulatory Visit: Payer: Self-pay | Admitting: Family Medicine

## 2016-10-19 ENCOUNTER — Encounter: Payer: Self-pay | Admitting: Neurology

## 2016-10-20 ENCOUNTER — Other Ambulatory Visit: Payer: Self-pay | Admitting: Neurology

## 2016-10-20 DIAGNOSIS — G40209 Localization-related (focal) (partial) symptomatic epilepsy and epileptic syndromes with complex partial seizures, not intractable, without status epilepticus: Secondary | ICD-10-CM

## 2016-10-20 MED ORDER — LAMOTRIGINE 100 MG PO TABS: ORAL_TABLET | ORAL | 11 refills | Status: DC

## 2016-10-23 NOTE — Progress Notes (Addendum)
Jordan Mccullough. We[place] 76 y.o. man with metastatic non-small cell lung cancer who received SRS to 2 lesions in the frontoparietal region radiation completed 08-25-15, 10-26-16 review MRI brain w wo contrast, FU.   Headache:Had a headache yesterday, it did not last long.  Has been having a stuffy head and running nose, clear drainage and sneezing x one week. Pain:None Dizziness:None Nausea/Vomiting/Ataxia:None Visional changes(Blurred/double vision, blind spots and peripheral vision changes):None, wears glasses, reports he needs new glasses. Ring in ears:None Fatigue:Having all during the day. Cognitive changes: Denies having any  short term memory recall  Problems or difficult finding words. Weight: Wt Readings from Last 3 Encounters:  10/30/16 145 lb (65.8 kg)  09/28/16 145 lb 5 oz (65.9 kg)  09/27/16 145 lb 4 oz (65.9 kg)   Appetite:Eating three meals a day and snacks. Respiratory complaints: None other than stuffy head and running nose, clear drainage and sneezing x one week. Imaging: 10-26-16 MRI brain w wo contrast Labs: No BP (!) 152/87   Pulse 70   Temp 97.7 F (36.5 C) (Oral)   Resp 16   Ht '5\' 4"'$  (1.626 m)   Wt 145 lb (65.8 kg)   SpO2 99%   BMI 24.89 kg/m

## 2016-10-26 ENCOUNTER — Ambulatory Visit
Admission: RE | Admit: 2016-10-26 | Discharge: 2016-10-26 | Disposition: A | Discharge status: Home / Self Care | Payer: Medicare Other | Source: Ambulatory Visit | Attending: Radiation Oncology | Admitting: Radiation Oncology

## 2016-10-26 DIAGNOSIS — C7931 Secondary malignant neoplasm of brain: Secondary | ICD-10-CM | POA: Diagnosis not present

## 2016-10-26 DIAGNOSIS — Z85118 Personal history of other malignant neoplasm of bronchus and lung: Secondary | ICD-10-CM | POA: Diagnosis not present

## 2016-10-26 DIAGNOSIS — C7949 Secondary malignant neoplasm of other parts of nervous system: Secondary | ICD-10-CM

## 2016-10-26 MED ORDER — GADOBENATE DIMEGLUMINE 529 MG/ML IV SOLN
13.0000 mL | Freq: Once | INTRAVENOUS | Status: AC | PRN
  Administered 2016-10-26: 13 mL via INTRAVENOUS

## 2016-10-30 ENCOUNTER — Ambulatory Visit
Admission: RE | Admit: 2016-10-30 | Discharge: 2016-10-30 | Disposition: A | Discharge status: Home / Self Care | Payer: Medicare Other | Source: Ambulatory Visit | Attending: Radiation Oncology | Admitting: Radiation Oncology

## 2016-10-30 ENCOUNTER — Encounter: Payer: Self-pay | Admitting: Radiation Oncology

## 2016-10-30 VITALS — BP 152/87 | HR 70 | Temp 97.7°F | Resp 16 | Ht 64.0 in | Wt 145.0 lb

## 2016-10-30 DIAGNOSIS — I2510 Atherosclerotic heart disease of native coronary artery without angina pectoris: Secondary | ICD-10-CM | POA: Insufficient documentation

## 2016-10-30 DIAGNOSIS — Z91041 Radiographic dye allergy status: Secondary | ICD-10-CM | POA: Diagnosis not present

## 2016-10-30 DIAGNOSIS — Z79899 Other long term (current) drug therapy: Secondary | ICD-10-CM | POA: Diagnosis not present

## 2016-10-30 DIAGNOSIS — Z801 Family history of malignant neoplasm of trachea, bronchus and lung: Secondary | ICD-10-CM | POA: Insufficient documentation

## 2016-10-30 DIAGNOSIS — Z7951 Long term (current) use of inhaled steroids: Secondary | ICD-10-CM | POA: Insufficient documentation

## 2016-10-30 DIAGNOSIS — C7931 Secondary malignant neoplasm of brain: Secondary | ICD-10-CM | POA: Diagnosis not present

## 2016-10-30 DIAGNOSIS — Z923 Personal history of irradiation: Secondary | ICD-10-CM | POA: Diagnosis not present

## 2016-10-30 DIAGNOSIS — Z85118 Personal history of other malignant neoplasm of bronchus and lung: Secondary | ICD-10-CM | POA: Diagnosis not present

## 2016-10-30 DIAGNOSIS — I252 Old myocardial infarction: Secondary | ICD-10-CM | POA: Insufficient documentation

## 2016-10-30 DIAGNOSIS — F17210 Nicotine dependence, cigarettes, uncomplicated: Secondary | ICD-10-CM | POA: Insufficient documentation

## 2016-10-30 DIAGNOSIS — G40909 Epilepsy, unspecified, not intractable, without status epilepticus: Secondary | ICD-10-CM | POA: Insufficient documentation

## 2016-10-30 DIAGNOSIS — Z08 Encounter for follow-up examination after completed treatment for malignant neoplasm: Secondary | ICD-10-CM | POA: Diagnosis not present

## 2016-10-30 DIAGNOSIS — Z888 Allergy status to other drugs, medicaments and biological substances status: Secondary | ICD-10-CM | POA: Diagnosis not present

## 2016-10-30 DIAGNOSIS — Z7982 Long term (current) use of aspirin: Secondary | ICD-10-CM | POA: Diagnosis not present

## 2016-10-30 DIAGNOSIS — Z85841 Personal history of malignant neoplasm of brain: Secondary | ICD-10-CM | POA: Diagnosis not present

## 2016-10-30 DIAGNOSIS — Z8249 Family history of ischemic heart disease and other diseases of the circulatory system: Secondary | ICD-10-CM | POA: Diagnosis not present

## 2016-10-30 DIAGNOSIS — Z8042 Family history of malignant neoplasm of prostate: Secondary | ICD-10-CM | POA: Insufficient documentation

## 2016-10-30 DIAGNOSIS — E785 Hyperlipidemia, unspecified: Secondary | ICD-10-CM | POA: Diagnosis not present

## 2016-10-30 DIAGNOSIS — I10 Essential (primary) hypertension: Secondary | ICD-10-CM | POA: Diagnosis not present

## 2016-10-30 NOTE — Progress Notes (Signed)
Radiation Oncology         [phone] ________________________________  Name: Jordan Mccullough MRN: [id number]  Date: 10/30/2016  DOB: [birth year]  Follow-Up Visit Note  CC: Tammi Sou, MD  Gaye Pollack, MD  Diagnosis:   Metastatic Lung Cancer  Interval Since Radiotherapy:  14 months  08/25/15 SRS Treatment:    1.  PTV1 Rt Vertex Frontoparietal 82m target was treated using 4 Arcs to a prescription dose of 18 Gy. ExacTrac Snap verification was performed for each couch angle.  2.  PTV2  Rt Posterior Frontal Lobe 161mtarget was treated using 4 Arcs to a prescription dose of 20 Gy. ExacTrac Snap verification was performed for each couch angle.   Narrative:   Mr. RoMahnkes a very pleasant [age].o. gentleman with a history of metastatic non-small cell lung cancer who received SRS to 2 lesions in the frontoparietal region. He continues to He had an MRI of the brain on 10/26/16 that revealed a collapsing 13 x 18 mm right parietal cystic metastasis that previously was 17 x 25 mm. There was collapsed cortical parietal metastasis measuring 20 x 11 mm, and previously was 19 x 12 mm.  Similar changes to a few punctate sites were also noted and stable. There was stable vasogenic edema. He continues to take vitamin E, and Trental. He also is on Lamictal but tapering down off Keppra under the care of Dr. AqDelice Lesch  On review of systems, the patient reports that he is doing well overall. He denies any chest pain, shortness of breath, cough, fevers, chills, night sweats, unintended weight changes. He continues to have fatigue and describes systemic weakness, despite walking well and using a cane for support. He denies any bowel or bladder disturbances, and denies abdominal pain, nausea or vomiting. He denies any new musculoskeletal or joint aches or pains. He denies any headaches, blurred vision, double vision, auditory disturbances.  He also continues to smoke. A complete review of systems is obtained and is  otherwise negative.  Past Medical History:  Past Medical History:  Diagnosis Date  . AAA (abdominal aortic aneurysm) ([place]  . Asymptomatic cholelithiasis 07/2015   Incidental finding on PET CT  . Back pain 04/19/2016  . Coronary artery disease    MI 1992, S/P  PTCA; negative stress test in November 2011 with no ischemia.   . Diverticulosis   . Hearing loss    Bilateral   . History of radiation therapy 11/10/13-12/12/13   lung,50Gy/2576f. Hyperlipidemia    Crestor= myalgias  . Hypertension   . Lung cancer ([facility]/2014   non-small cell;  L upper lobectomy with mediastinal LN dissection, chemo, and radiation in 2014/2015. Remission until pt had questionable seizure 07/2015--MRI showed brain mets; palliative brain rad (stereotactic radiation therapy) started 08/25/15.  CT C/A/P clear 02/2016 and 05/2016.  Plan per onc is to repeat in 6 mo.  . Malignant neoplasm of upper lobe, left bronchus or lung ([place]/06/2014  . Malignant ne[facility] of upper lobe, left bronchus or lung ([facility]/01/2013  . Myocardial infarction 1992   Dr McAAngelena Form . Peripheral vascular disease ([facility]/2014   4.8x4.6 cm infrarenal abdominal aortic fusiform aneurysm, 1.5 cm right common iliac artery aneurysm.  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  . [facility]   Stereotactic radiation therapy: frontoparietal 18gy,posterior frontal lobe 20gy,  .  Seizure disorder ([place]) 2016/2017   as sequela of brain mets;  Grand mal seizure 07/2015, then got on keppra and was seizure-free until focal motor seizures of L side of face began 02/2016- these responded well to up-titration of keppra but pt had adverse side effects so eventually pt had to be switched over to lamictal 07/2016.  Marland Kitchen Shortness of breath   . Tobacco dependence    "Quit" 1992, but pt has smoked "on and off" since that time    Past Surgical  History: Past Surgical History:  Procedure Laterality Date  . ABDOMINAL AORTIC ENDOVASCULAR STENT GRAFT N/A 05/07/2014   Procedure: ABDOMINAL AORTIC ENDOVASCULAR STENT GRAFT;  Surgeon: Serafina Mitchell, MD;  Location: [place] OR;  Service: Vascular;  Laterality: N/A;  . Carotid duplex dopplers  07/2015   1-39% on R, no signif dz noted on L  . COLONOSCOPY W/ POLYPECTOMY  2003    negative 2010,due 2020; Dr Olevia Perches  . CORONARY ANGIOPLASTY     no stents  . CYSTOSCOPY/RETROGRADE/URETEROSCOPY Bilateral 10/09/2012   Procedure: BILATERAL RETROGRADE bladder and urethral BIOPSY ;  Surgeon: Molli Hazard, MD;  Location: [facility];  Service: Urology;  Laterality: Bilateral;  BILATERAL RETROGRADE   . EEG  08/05/15   Pt placed on keppra just prior to this test due to having ? seizure (MRI showed brain mets)  . HERNIA REPAIR Bilateral    Inguinal  . INGUINAL HERIIORRHAPHY BILATERALLY    . IR GENERIC HISTORICAL  07/20/2016   IR RADIOLOGIST EVAL & MGMT 07/20/2016 [facility] INTERV RAD  . IR GENERIC HISTORICAL  08/02/2016   IR RADIOLOGIST EVAL & MGMT 08/02/2016 Sandi Mariscal, MD [facility] INTERV RAD  . MEDIASTINOSCOPY N/A 05/01/2013   Procedure: MEDIASTINOSCOPY;  Surgeon: Gaye Pollack, MD;  Location: [facility] OR;  Service: Thoracic;  Laterality: N/A;  . PFTs  04/2013   Minimal obstructive airway disease  . PILONIDAL CYST EXCISION    . PROSTATE BIOPSY N/A 10/09/2012   Procedure: PROSTATIC URETHRAL BIOPSY;  Surgeon: Molli Hazard, MD;  Location: [facility];  Service: Urology;  Laterality: N/A;  PROSTATIC URETHRAL BIOPSY   . PTCA  1992  . ROTATOR CUFF REPAIR     Bilateral  . THOROCOTOMY WITH LOBECTOMY Left 05/29/2013   Procedure: LEFT THOROCOTOMY WITH LEFT UPPER LOBE LOBECTOMY;  Surgeon: Gaye Pollack, MD;  Location: [place];  Service: Thoracic;  Laterality: Left;  Marland Kitchen VIDEO BRONCHOSCOPY N/A 05/01/2013   Procedure: VIDEO BRONCHOSCOPY;  Surgeon: Gaye Pollack, MD;  Location: [facility] OR;  Service: Thoracic;  Laterality: N/A;     Social History:  Social History   Social History  . Marital status: Married    Spouse name: N/A  . Number of children: N/A  . Years of education: N/A   Occupational History  . Retired Risk analyst    Social History Main Topics  . Smoking status: Current Every Day Smoker    Packs/day: 1.00    Years: 24.00    Types: Cigarettes    Last attempt to quit: 05/28/2013  . Smokeless tobacco: Never Used     Comment: QUIT 15 YEARS AGO  . Alcohol use 9.0 oz/week    3 Glasses of wine, 12 Cans of beer per week     Comment: 1-2 beers a day  . Drug use: No  . Sexual activity: Not on file   Other Topics Concern  . Not on file   Social History Narrative   HEART HEALTHY DIET.  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   MARRIED, 2 children who live in [place].  Smoked on and off since then.   ALCOHOL USE -YES- RED WINE SOCIALLY, couple of beers at night usually.    Family History: Family History  Problem Relation Age of Onset  . Hypertension Mother   . Prostate cancer Father     Prostate cancer  . Cancer Father     Brain Tumor  . Lung cancer Sister     NON SMOKER  . Cancer Sister   . Hyperlipidemia Sister   . Hyperlipidemia Brother   . Heart attack Brother   . Hypertension Brother   . Diabetes Neg Hx   . Stroke Neg Hx     ALLERGIES:  is allergic to fluvirin [influenza vac typ a&b surf ant]; rosuvastatin; and iodinated diagnostic agents.  Meds: Current Outpatient Prescriptions  Medication Sig Dispense Refill  . amLODipine (NORVASC) 5 MG tablet TAKE 1 TABLET (5 MG TOTAL) BY MOUTH DAILY. 90 tablet 3  . aspirin EC 81 MG tablet Take 81 mg by mouth daily.    Marland Kitchen atorvastatin (LIPITOR) 20 MG tablet TAKE 1 TABLET BY MOUTH DAILY 30 tablet 5  . Glucosamine-Chondroit-Vit C-Mn (GLUCOSAMINE 1500 COMPLEX PO) Take 1,500 mg by mouth daily.     Marland Kitchen lamoTRIgine (LAMICTAL) 100 MG tablet Take 2 tablets twice a day 120 tablet 11  . levETIRAcetam (KEPPRA XR) 500 MG 24 hr tablet  Take 2 tablet every night 60 tablet 3  . metoprolol (LOPRESSOR) 50 MG tablet Take 1 tablet (50 mg total) by mouth 2 (two) times daily. 180 tablet 1  . Multiple Vitamin (MULTIVITAMIN) tablet Take 1 tablet by mouth every evening.     . pentoxifylline (TRENTAL) 400 MG CR tablet Take 1 tablet (400 mg total) by mouth 3 (three) times daily. 90 tablet 5  . vitamin E 1000 UNIT capsule Take 1 capsule (1,000 Units total) by mouth daily. 30 capsule 12  . budesonide-formoterol (SYMBICORT) 160-4.5 MCG/ACT inhaler Inhale 1-2 puffs into the lings every 12 hours. Gargle and Spit after use. (Patient not taking: Reported on 10/30/2016) 10.2 Inhaler 3   No current facility-administered medications for this encounter.     Physical Findings:  height is '5\' 4"'$  (1.626 m) and weight is 145 lb (65.8 kg). His oral temperature is 97.7 F (36.5 C). His blood pressure is 152/87 (abnormal) and his pulse is 70. His respiration is 16 and oxygen saturation is 99%. .   In general this is a well appearing Caucasian male in no acute distress. He's alert and oriented x4 and appropriate throughout the examination. Cardiopulmonary assessment is negative for acute distress and he exhibits normal effort. Neurologic assessment is stable and again reveals 4/5 proximal strength in the left UE and LE. Grip strength 5/5. No gait disturbances are noted.  Lab Findings: Lab Results  Component Value Date   WBC 9.8 05/26/2016   HGB 15.7 05/26/2016   HCT 47.4 05/26/2016   MCV 85.6 05/26/2016   PLT 219 05/26/2016     Radiographic Findings: Mr Jeri Cos WU Contrast  Result Date: 10/26/2016 CLINICAL DATA:  History of metastatic lung cancer, status post radiation January 2017. Follow-up treated brain metastasis. EXAM: MRI HEAD WITHOUT AND WITH CONTRAST TECHNIQUE: Multiplanar, multiecho pulse sequences of the brain and surrounding structures were obtained without and with intravenous contrast. CONTRAST:  7m MULTIHANCE GADOBENATE DIMEGLUMINE 529  MG/ML IV SOLN COMPARISON:  MRI of the brain July 20, 2016 FINDINGS:  INTRACRANIAL CONTENTS: Collapsing 13 x 18 mm RIGHT parietal cystic metastasis was 17 x 25 mm. Marginal reduced diffusion and central susceptibility artifact similar to prior MRI. 20 x 11 mm collapsed cortical the sepsis cortical parietal metastasis was 19 x 12 mm. Similar surrounding vasogenic edema. No new metastasis. No midline shift or mass effect. No reduced diffusion to suggest acute ischemia. A few punctate chronic micro hemorrhages are similar. Ventricles and sulci are overall normal for patient's age. Patchy white matter FLAIR T2 hyperintensities exclusive of the aforementioned abnormality compatible with mild chronic small vessel ischemic disease. Old small LEFT cerebellar infarcts. No abnormal extra-axial fluid collections or abnormal extra-axial enhancement. VASCULAR: Normal major intracranial vascular flow voids present at skull base. SKULL AND UPPER CERVICAL SPINE: No abnormal sellar expansion. No suspicious calvarial bone marrow signal. Craniocervical junction maintained. SINUSES/ORBITS: The mastoid air-cells and included paranasal sinuses are well-aerated.The included ocular globes and orbital contents are non-suspicious. OTHER: None. IMPRESSION: 1. Stable to improved appearance of 2 RIGHT parietal treated metastasis. No new lesions. 2. Stable mild to moderate chronic small vessel ischemic disease and old small LEFT cerebellar infarcts. Electronically Signed   By: Elon Alas M.D.   On: 10/26/2016 14:06    Impression/Plan: 1. Recurrent Stage IIIA, T2a, N2, M0, NSCLC, adenocarcinoma of the left upper lobe. Jordan Mccullough appears to be doing well radiographically and clinically. After reviewing his scans in conference, the recommendations were to continue Vitamin E and Trental. He will continue to taper Keppra and continue Lamictal under the care of Dr. Delice Lesch.  We will follow up with him in 3 months time for his next MRI of  the brain, and continued evaluation. He remains in observation with Dr. Julien Nordmann as well and will see him back in April 2018. The patient will contact us with questions or concerns prior to his next visit.  In a visit lasting 25 minutes, greater than 50% of the time was spent face to face discussing his MRI, reviewing the images together, and coordinating the patient's care.     Carola Rhine, PAC

## 2016-11-14 ENCOUNTER — Other Ambulatory Visit: Payer: Self-pay | Admitting: Neurology

## 2016-11-14 ENCOUNTER — Encounter: Payer: Self-pay | Admitting: Neurology

## 2016-11-14 NOTE — Telephone Encounter (Signed)
Rx sent 

## 2016-11-15 ENCOUNTER — Telehealth: Payer: Self-pay

## 2016-11-15 DIAGNOSIS — G40209 Localization-related (focal) (partial) symptomatic epilepsy and epileptic syndromes with complex partial seizures, not intractable, without status epilepticus: Secondary | ICD-10-CM

## 2016-11-15 NOTE — Telephone Encounter (Signed)
-----   Message from Cameron Sprang, MD sent at 11/15/2016  8:37 AM EDT ----- Regarding: Lamictal level He sent me a MyChart message, let's do a Lamictal level on him, pls instruct him to have the bloodwork first thing in the morning before he takes his Lamictal dose. Thanks

## 2016-11-15 NOTE — Telephone Encounter (Signed)
Clld pt  - advsd Lab req with be upfront waiting for him. Reminded him not to take medication before having labwork done.

## 2016-11-16 ENCOUNTER — Other Ambulatory Visit (INDEPENDENT_AMBULATORY_CARE_PROVIDER_SITE_OTHER): Payer: Medicare Other

## 2016-11-16 DIAGNOSIS — G40209 Localization-related (focal) (partial) symptomatic epilepsy and epileptic syndromes with complex partial seizures, not intractable, without status epilepticus: Secondary | ICD-10-CM

## 2016-11-16 NOTE — Addendum Note (Signed)
Addended by: Tommas Olp B on: 11/16/2016 10:05 AM   Modules accepted: Orders

## 2016-11-18 LAB — LAMOTRIGINE LEVEL: Lamotrigine Lvl: 6.3 ug/mL (ref 4.0–18.0)

## 2016-11-20 ENCOUNTER — Telehealth: Payer: Self-pay

## 2016-11-20 NOTE — Telephone Encounter (Signed)
-----   Message from Cameron Sprang, MD sent at 11/20/2016  8:57 AM EDT ----- Pls let him know that the Lamictal level is 6.3. Usual range is between 4 to 18, so we can still increase the dose. Recommend increasing Lamotrigine '200mg'$ : Take 1 tab in AM, 1.5 tabs in PM. Continue the Keppra every other day for another 2 weeks, then try to stop again. Hopefully no further seizures when we get off the Keppra. Pls call in new Rx for Lamotrigine, thanks.

## 2016-11-20 NOTE — Telephone Encounter (Signed)
Clld pt - LMOVMTC re lab results. 

## 2016-11-21 ENCOUNTER — Encounter: Payer: Self-pay | Admitting: Neurology

## 2016-11-22 IMAGING — CT CT HEAD W/O CM
2 series · 16 of 30 positions shown, 19 images · non-contrast
Comparison: MRI brain 10/17/2013.

CLINICAL DATA: Seizure-like episode.  The onset was unwitnessed.

EXAM:
CT HEAD WITHOUT CONTRAST
TECHNIQUE: Contiguous axial images were obtained from the base of the skull
through the vertex without intravenous contrast.

[Series 2: head w/o · axial · non-contrast · 0.45mm/px · z∈[+1683,+1808]mm · 9 of 33 slices shown, 12 images]
[im 4/33  brain]
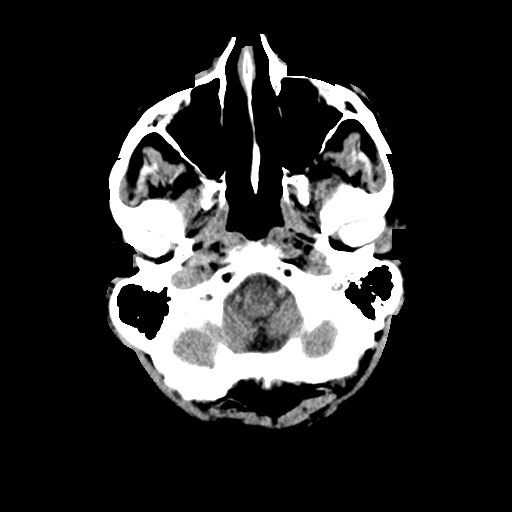
[im 4/33  bone]
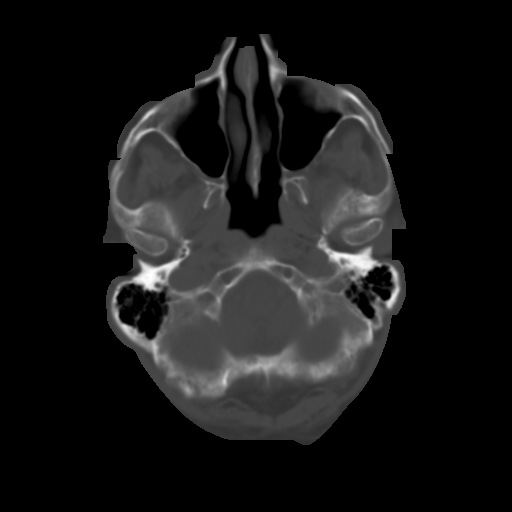
[im 7/33  brain]
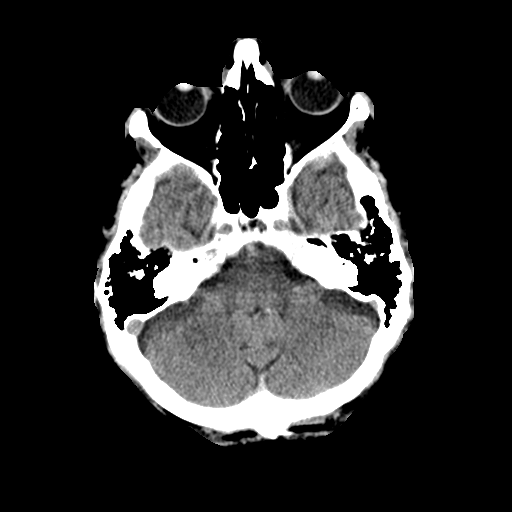
[im 10/33  brain]
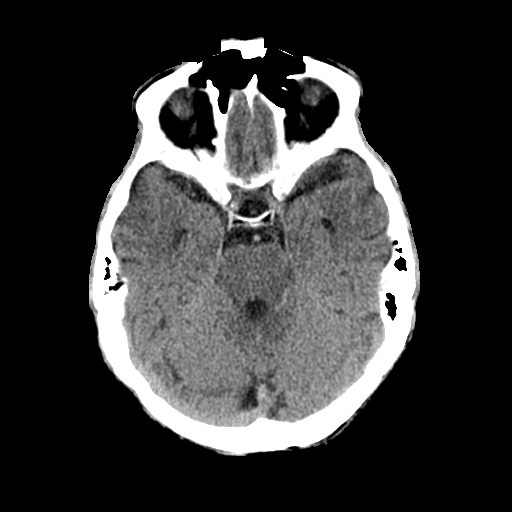
[im 13/33  brain]
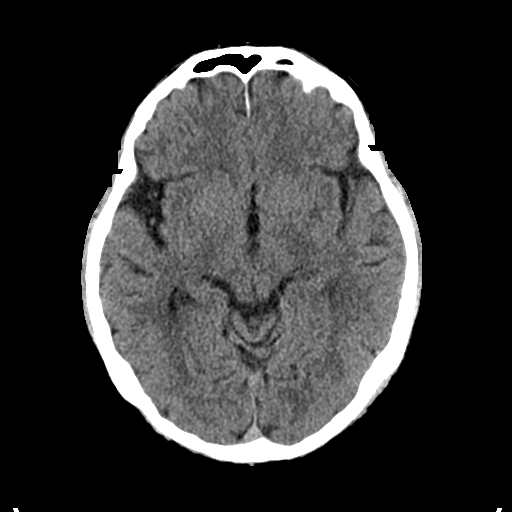
[im 17/33  brain]
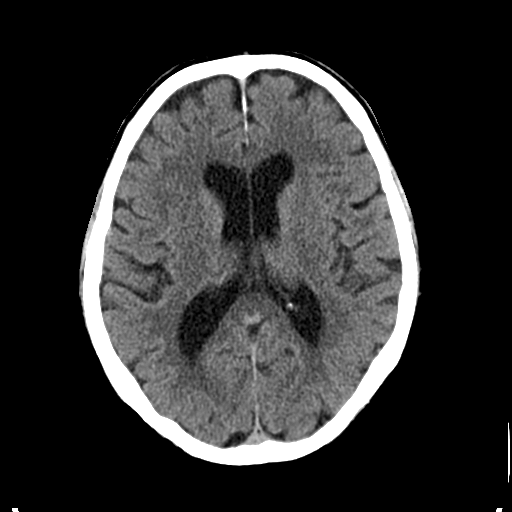
[im 17/33  bone]
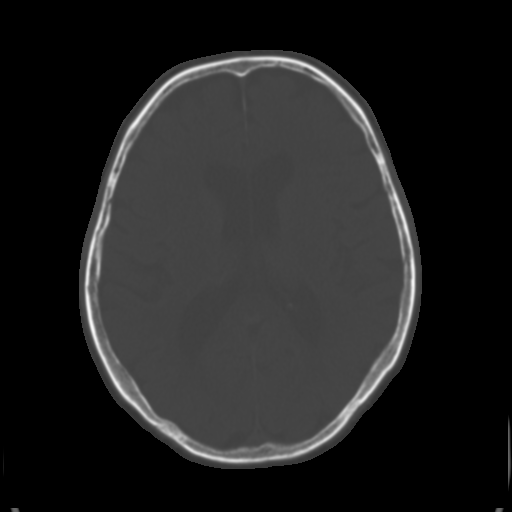
[im 20/33  brain]
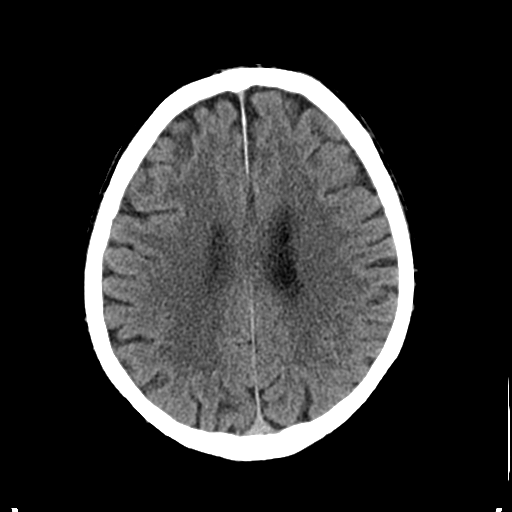
[im 23/33  brain]
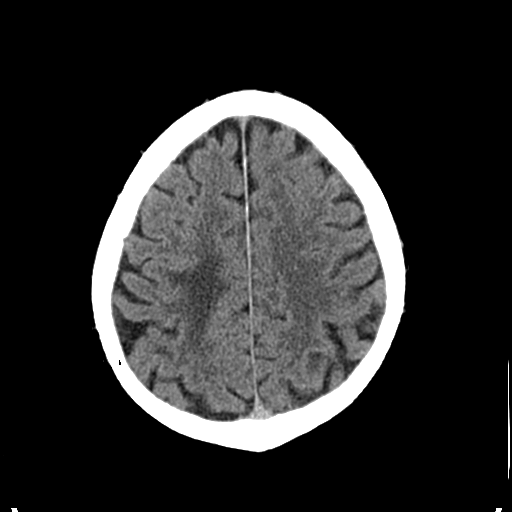
[im 26/33  brain]
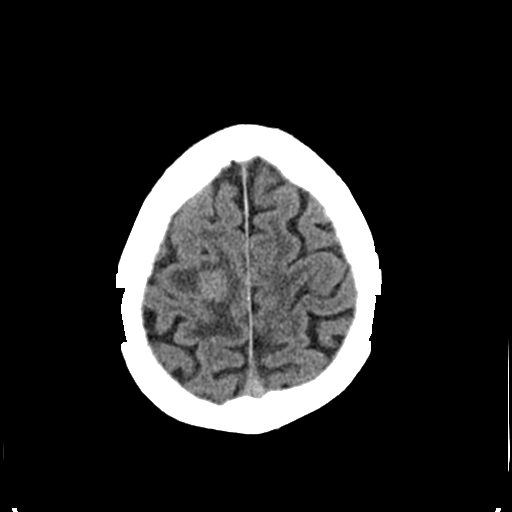
[im 29/33  brain]
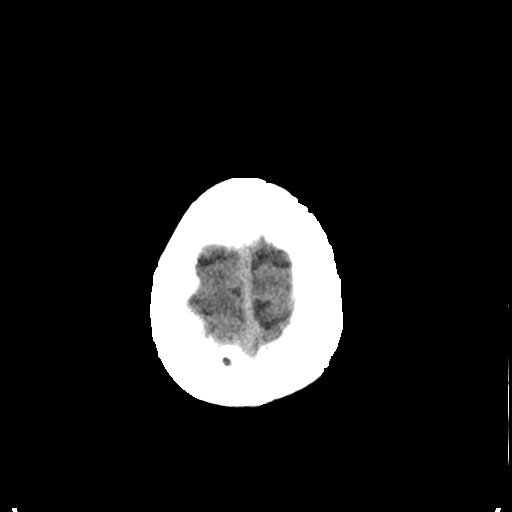
[im 29/33  bone]
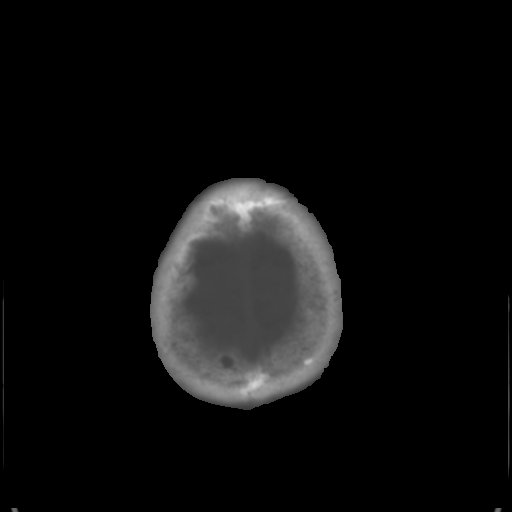

[Series 3: bone windows · axial · 0.45mm/px · z∈[+1683,+1791]mm · 7 of 54 slices shown]
[im 6/54  bone]
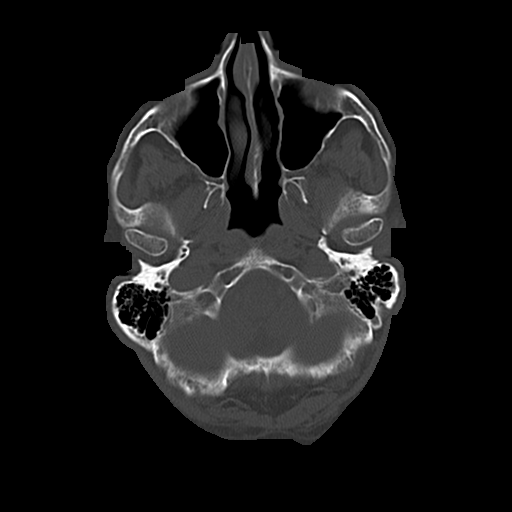
[im 12/54  bone]
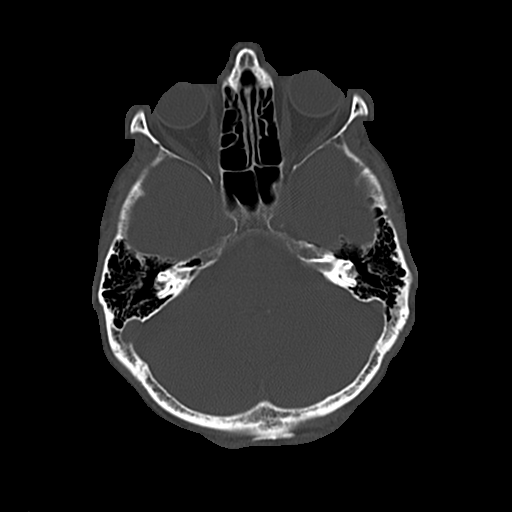
[im 18/54  bone]
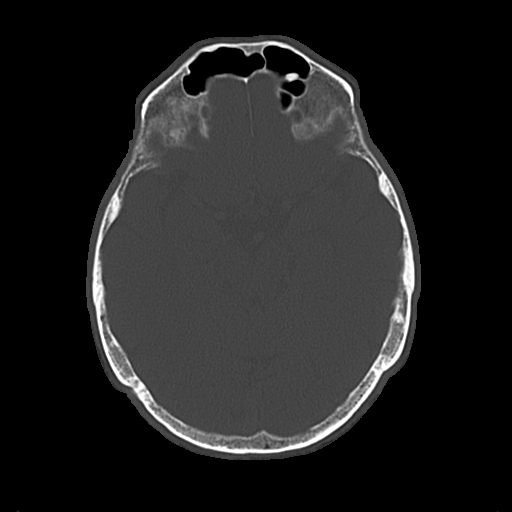
[im 24/54  bone]
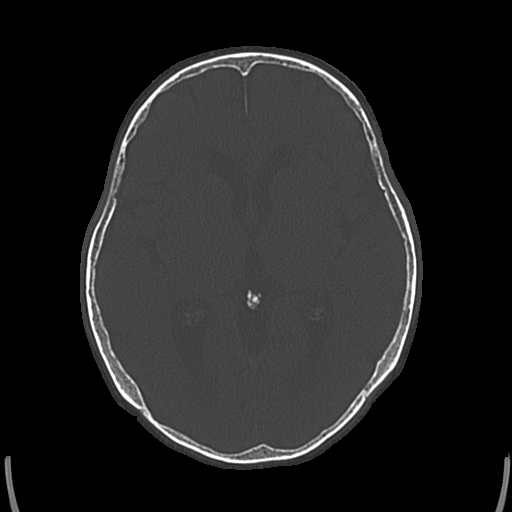
[im 30/54  bone]
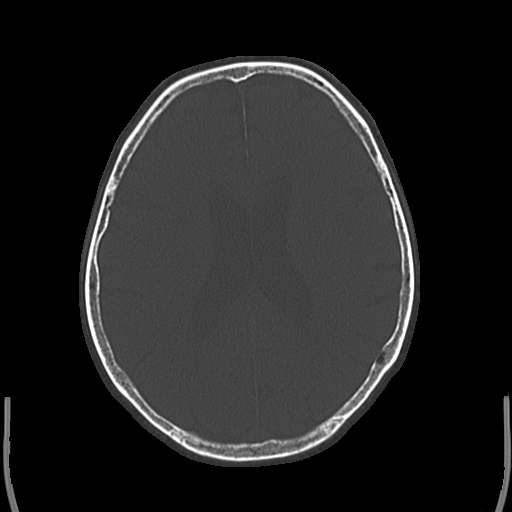
[im 36/54  bone]
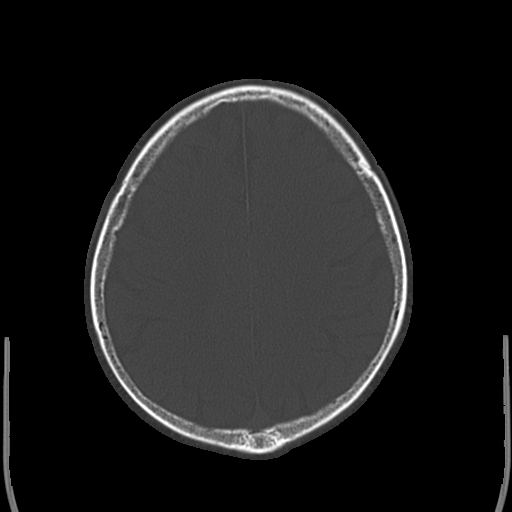
[im 42/54  bone]
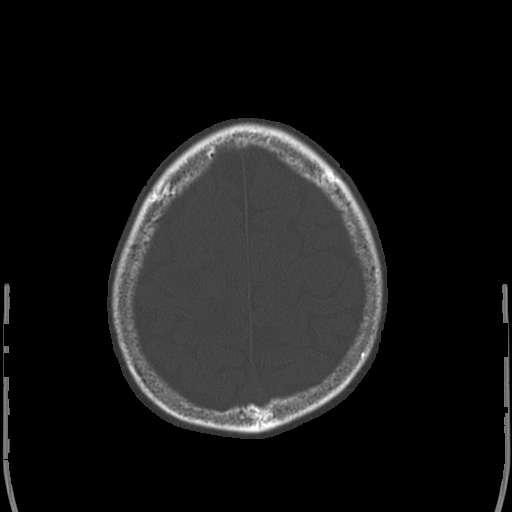

[16 of 30 positions shown; findings below may reference images not displayed]

FINDINGS: A 1.6 x 1.5 cm hyperdense mass lesion is present in the high right
frontal lobe with surrounding vasogenic edema.

No other focal mass lesions are present. There are no other focal
areas of edema.

No other acute infarct is present. There is no acute hemorrhage. The
ventricles are of normal size. No significant extra-axial fluid
collection is present.

The paranasal sinuses and mastoid air cells are clear. Calvarium is
intact. No significant extracranial soft tissue lesions are present.
IMPRESSION: 1. 1.6 x 1.5 cm hyperdense mass lesion in the high right frontal
lobe is most concerning for a focal metastasis.
2. No other focal lesions are evident. Recommend MRI the brain
without and with contrast for further evaluation of metastatic
disease.
These results were called by telephone at the time of interpretation
on 08/04/2015 at [DATE] to Dr. LORRAINE JIM , who verbally
acknowledged these results.

## 2016-11-22 IMAGING — MR MR HEAD WO/W CM
9 of 13 series · 34 of 48 positions shown · IV contrast (multihance)
Comparison: CT head August 04, 2015 at 9272 hours

CLINICAL DATA: Flu shot at 9 a.m., vomiting beginning at 3 p.m.
drank [place]. Sudden onset seizure like symptoms.

EXAM:
MRI HEAD WITHOUT AND WITH CONTRAST
TECHNIQUE: Multiplanar, multiecho pulse sequences of the brain and surrounding
structures were obtained without and with intravenous contrast.
CONTRAST:  13mL MULTIHANCE GADOBENATE DIMEGLUMINE 529 MG/ML IV SOLN

[Series 4: DWI · axial · 3.0mm · 1.09mm/px · z∈[-18,+138]mm · 9 of 106 slices shown (1 of 4)]
[im 1/106]
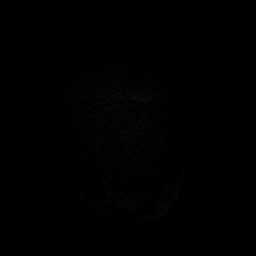
[im 14/106]
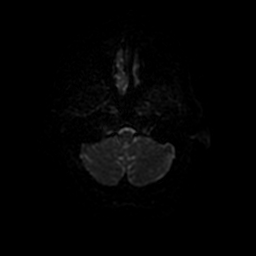
[im 27/106]
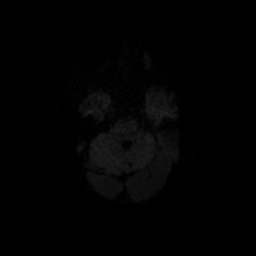
[im 40/106]
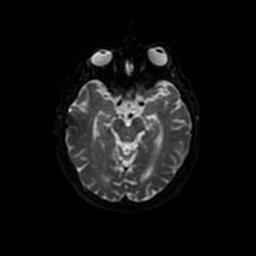
[im 53/106]
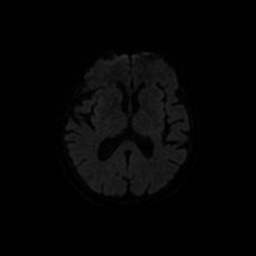
[im 66/106]
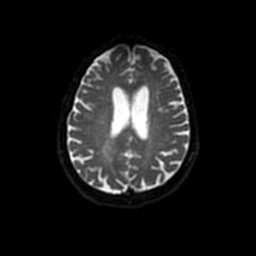
[im 79/106]
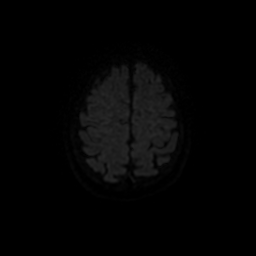
[im 92/106]
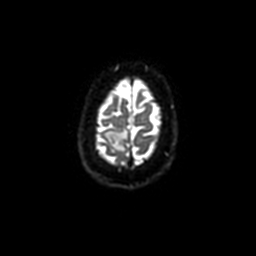
[im 106/106]
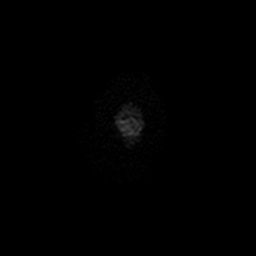

[Series 5: DWI · coronal · 5.0mm · 1.09mm/px · 6 of 72 slices shown (2 of 4)]
[im 1/72]
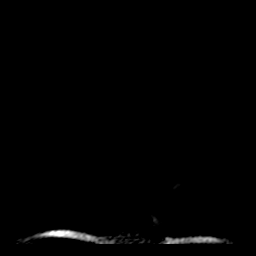
[im 15/72]
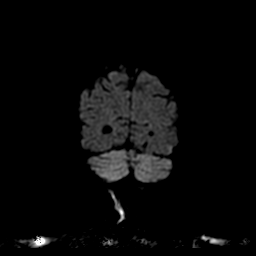
[im 29/72]
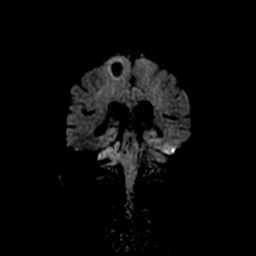
[im 43/72]
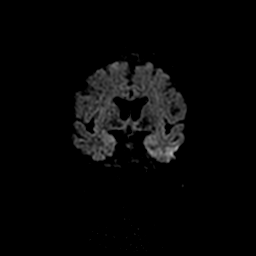
[im 57/72]
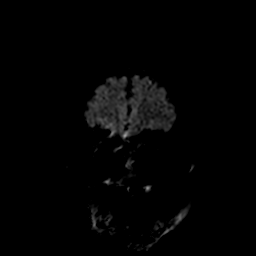
[im 72/72]
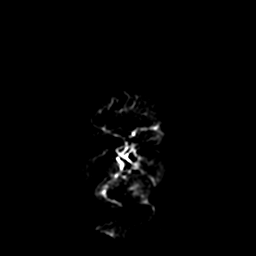

[Series 6: T2 · axial · 5.0mm · 0.43mm/px · z∈[-20,+128]mm · 2 of 24 slices shown]
[im 1/24]
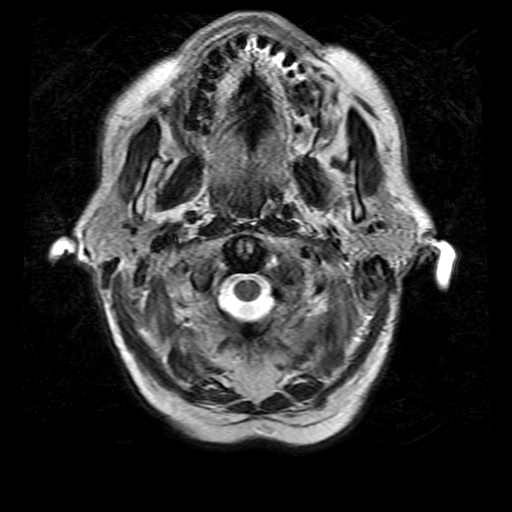
[im 24/24]
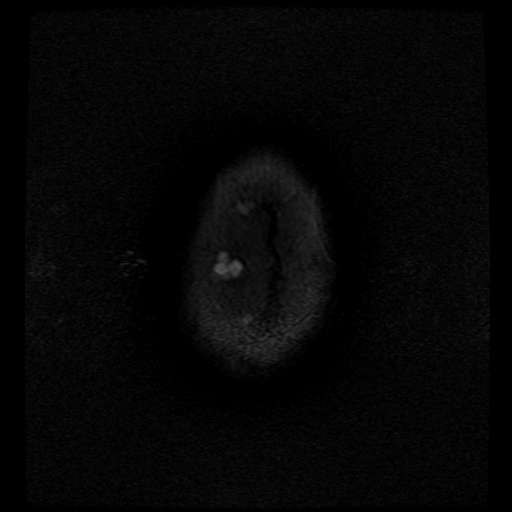

[Series 7: FLAIR · axial · 5.0mm · 0.43mm/px · z∈[-26,+134]mm · 2 of 24 slices shown]
[im 1/24]
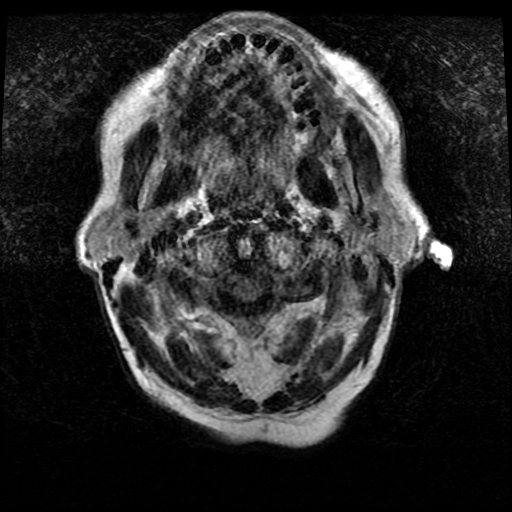
[im 24/24]
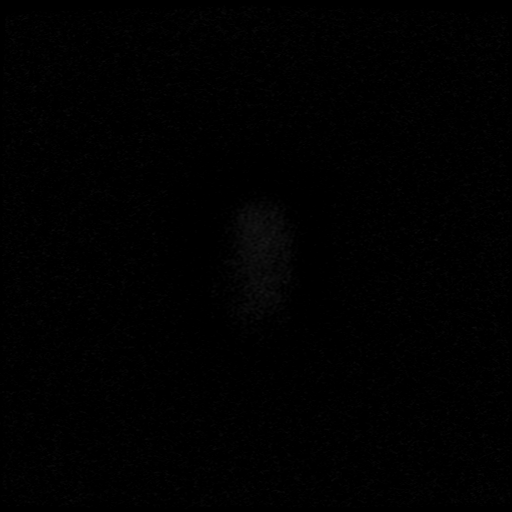

[Series 10: T2 post-contrast · coronal · 5.0mm · 0.45mm/px · 2 of 29 slices shown]
[im 1/29]
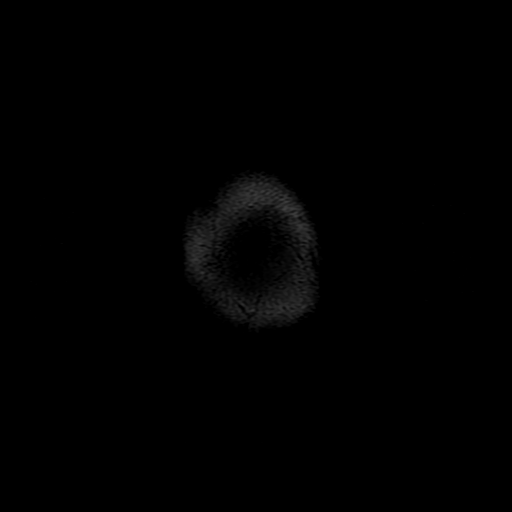
[im 15/29]
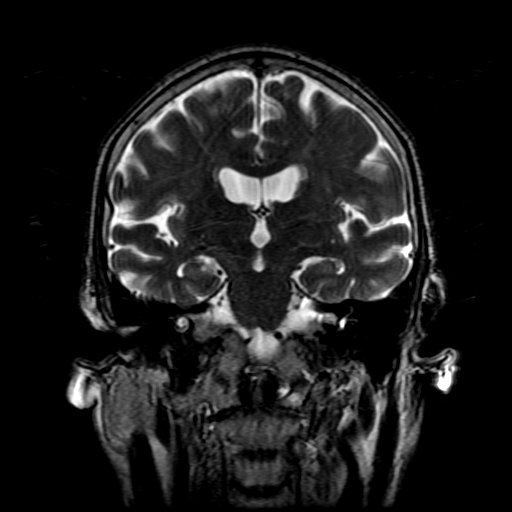

[Series 12: T1 post-contrast · coronal · 5.0mm · 0.45mm/px · 3 of 29 slices shown (1 of 2)]
[im 1/29]
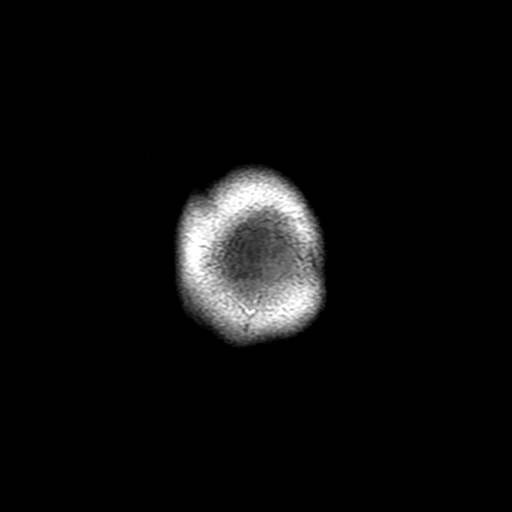
[im 15/29]
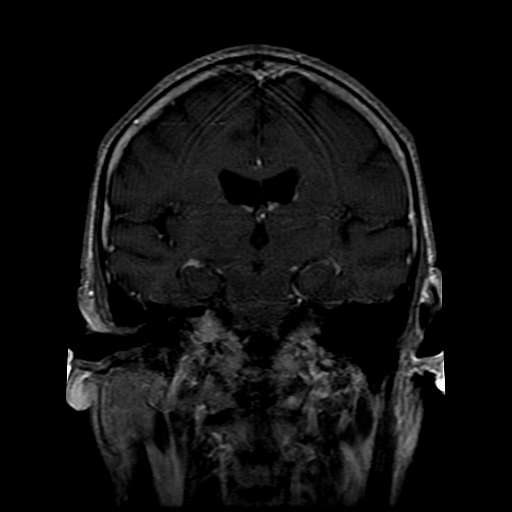
[im 29/29]
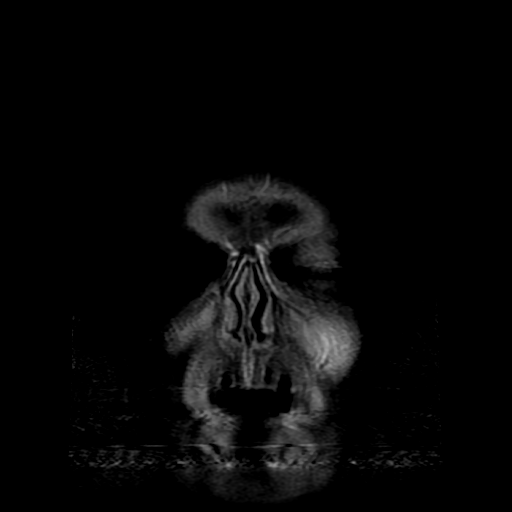

[Series 13: T1 post-contrast · sagittal · 5.0mm · 0.47mm/px · 2 of 24 slices shown (2 of 2)]
[im 1/24]
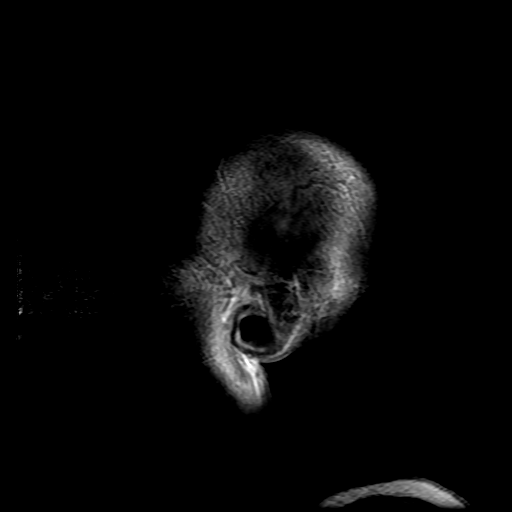
[im 24/24]
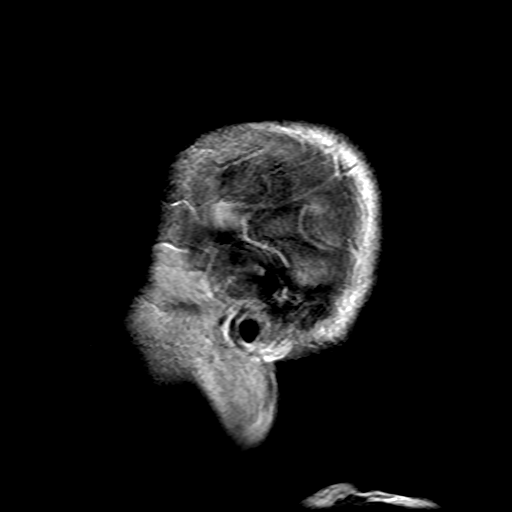

[Series 400: DWI · axial · 3.0mm · 1.09mm/px · z∈[-18,+138]mm · 5 of 53 slices shown (3 of 4)]
[im 1/53]
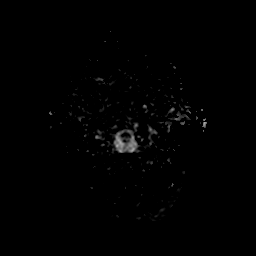
[im 14/53]
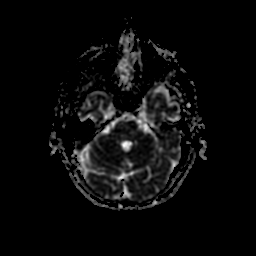
[im 27/53]
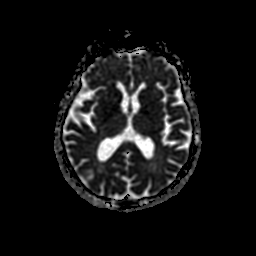
[im 40/53]
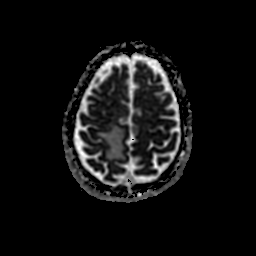
[im 53/53]
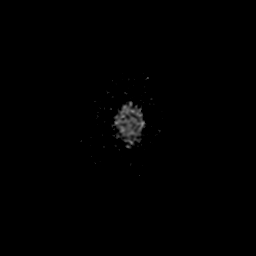

[Series 500: DWI · coronal · 5.0mm · 1.09mm/px · 3 of 36 slices shown (4 of 4)]
[im 1/36]
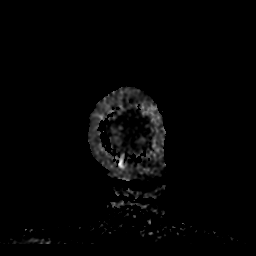
[im 18/36]
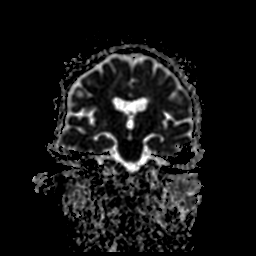
[im 36/36]
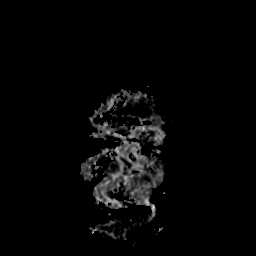

[34 of 48 positions shown; findings below may reference images not displayed]

FINDINGS: Multiple sequences are mildly more moderately motion degraded.

RIGHT posterior frontal 2 x 1.7 cm rounded mass in the precentral
gyrus demonstrates thin smooth peripheral enhancement. No associated
susceptibility artifact to suggest hemorrhage, no reduced diffusion
to suggest hypercellular tumor. A second T2 bright 11 x 7 mm mass
within the RIGHT posterior frontal gray-white matter junction with
similar peripheral enhancement. Small area of subtle FLAIR T2
hyperintense signal in the LEFT temporal cortex without enhancement
favors artifact, less likely nonenhancing tiny metastatic deposit.
No midline shift or mass effect. The ventricles and sulci are normal
for patient's age. Mild patchy supratentorial white matter FLAIR T2
hyperintensities exclusive of the aforementioned vasogenic edema
compatible with chronic small vessel ischemic disease. Normal major
intracranial vascular flow voids seen at the skull base. No
extra-axial masses or abnormal extra-axial enhancement.

Ocular globes and orbital contents are unremarkable. Paranasal
sinuses and mastoid air cells are well aerated. No abnormal sellar
expansion. No cerebellar tonsillar ectopia. No suspicious calvarial
bone marrow signal.
IMPRESSION: Motion degraded examination degrades sensitivity for small
metastatic deposits.

Two enhancing masses in RIGHT frontal lobe measuring up to 2 x
cm, 1 of which corresponds to CT abnormality with imaging
characteristics most consistent with metastatic disease. No
significant mass effect.

Involutional changes. Mild white matter changes compatible with
chronic small vessel ischemic disease.

## 2016-11-23 MED ORDER — LAMOTRIGINE 200 MG PO TABS: ORAL_TABLET | ORAL | 5 refills | Status: DC

## 2016-11-23 MED ORDER — LAMOTRIGINE 200 MG PO TABS: 200.0000 mg | ORAL_TABLET | Freq: Every day | ORAL | 5 refills | Status: DC

## 2016-11-23 NOTE — Telephone Encounter (Signed)
Pt's wife Gwenette Greet clld back - DPR verified - advsd of lab results; provide's notations; increase in Lamictal from 100 mg to 200 mg.   Pt and wife both stated they understood new directions on taking the Lamictal while they are at [facility] and once they get back home. Also, both stated they understood how to take the [place].  Electronically sent in Lamotrigine 200 mg to [place].

## 2016-11-23 NOTE — Addendum Note (Signed)
Addended by: Tommas Olp B on: 11/23/2016 09:43 AM   Modules accepted: Orders

## 2016-11-24 ENCOUNTER — Other Ambulatory Visit: Payer: Self-pay | Admitting: Radiation Therapy

## 2016-11-24 DIAGNOSIS — C7931 Secondary malignant neoplasm of brain: Secondary | ICD-10-CM

## 2016-11-28 ENCOUNTER — Other Ambulatory Visit: Payer: Medicare Other

## 2016-11-29 ENCOUNTER — Telehealth: Payer: Self-pay | Admitting: Internal Medicine

## 2016-11-29 NOTE — Telephone Encounter (Signed)
MM PAL 4/18. Moved lab to 4/24 prior to ct and f/u to 5/1. Left message for patient and mailed schedule.

## 2016-11-30 ENCOUNTER — Other Ambulatory Visit: Payer: Self-pay | Admitting: Family Medicine

## 2016-11-30 NOTE — Telephone Encounter (Signed)
[facility].  RF request for amlodipine LOV: 09/27/16 Next ov: None Last written: 10/18/15 #90 w/ 3RF

## 2016-12-05 ENCOUNTER — Encounter: Payer: Self-pay | Admitting: Neurology

## 2016-12-06 ENCOUNTER — Ambulatory Visit: Payer: Medicare Other | Admitting: Internal Medicine

## 2016-12-12 ENCOUNTER — Other Ambulatory Visit (HOSPITAL_BASED_OUTPATIENT_CLINIC_OR_DEPARTMENT_OTHER): Payer: Medicare Other

## 2016-12-12 ENCOUNTER — Ambulatory Visit (HOSPITAL_COMMUNITY)
Admission: RE | Admit: 2016-12-12 | Discharge: 2016-12-12 | Disposition: A | Discharge status: Home / Self Care | Payer: Medicare Other | Source: Ambulatory Visit | Attending: Internal Medicine | Admitting: Internal Medicine

## 2016-12-12 DIAGNOSIS — Z9889 Other specified postprocedural states: Secondary | ICD-10-CM

## 2016-12-12 DIAGNOSIS — C7931 Secondary malignant neoplasm of brain: Secondary | ICD-10-CM | POA: Diagnosis not present

## 2016-12-12 DIAGNOSIS — C3412 Malignant neoplasm of upper lobe, left bronchus or lung: Secondary | ICD-10-CM | POA: Diagnosis not present

## 2016-12-12 LAB — CBC WITH DIFFERENTIAL/PLATELET
BASO%: 0.6 % (ref 0.0–2.0)
Basophils Absolute: 0.1 10*3/uL (ref 0.0–0.1)
EOS%: 1.7 % (ref 0.0–7.0)
Eosinophils Absolute: 0.1 10*3/uL (ref 0.0–0.5)
HCT: 46.3 % (ref 38.4–49.9)
HGB: 15.7 g/dL (ref 13.0–17.1)
LYMPH%: 20.6 % (ref 14.0–49.0)
MCH: 28.9 pg (ref 27.2–33.4)
MCHC: 33.9 g/dL (ref 32.0–36.0)
MCV: 85.2 fL (ref 79.3–98.0)
MONO#: 0.7 10*3/uL (ref 0.1–0.9)
MONO%: 8.1 % (ref 0.0–14.0)
NEUT#: 6 10*3/uL (ref 1.5–6.5)
NEUT%: 69 % (ref 39.0–75.0)
Platelets: 232 10*3/uL (ref 140–400)
RBC: 5.43 10*6/uL (ref 4.20–5.82)
RDW: 14.6 % (ref 11.0–14.6)
WBC: 8.7 10*3/uL (ref 4.0–10.3)
lymph#: 1.8 10*3/uL (ref 0.9–3.3)

## 2016-12-12 LAB — COMPREHENSIVE METABOLIC PANEL
ALT: 16 U/L (ref 0–55)
AST: 15 U/L (ref 5–34)
Albumin: 4.2 g/dL (ref 3.5–5.0)
Alkaline Phosphatase: 64 U/L (ref 40–150)
Anion Gap: 10 mEq/L (ref 3–11)
BUN: 16 mg/dL (ref 7.0–26.0)
CO2: 27 mEq/L (ref 22–29)
Calcium: 10.4 mg/dL (ref 8.4–10.4)
Chloride: 104 mEq/L (ref 98–109)
Creatinine: 1.1 mg/dL (ref 0.7–1.3)
EGFR: 69 mL/min/{1.73_m2} — ABNORMAL LOW (ref >90)
Glucose: 103 mg/dl (ref 70–140)
Potassium: 4.4 mEq/L (ref 3.5–5.1)
Sodium: 141 mEq/L (ref 136–145)
Total Bilirubin: 0.54 mg/dL (ref 0.20–1.20)
Total Protein: 7.7 g/dL (ref 6.4–8.3)

## 2016-12-12 MED ORDER — IOPAMIDOL (ISOVUE-300) INJECTION 61%
INTRAVENOUS | Status: AC
  Filled 2016-12-12: qty 100

## 2016-12-13 ENCOUNTER — Ambulatory Visit (HOSPITAL_COMMUNITY)
Admission: RE | Admit: 2016-12-13 | Discharge: 2016-12-13 | Disposition: A | Discharge status: Home / Self Care | Payer: Medicare Other | Source: Ambulatory Visit | Attending: Internal Medicine | Admitting: Internal Medicine

## 2016-12-13 DIAGNOSIS — K8020 Calculus of gallbladder without cholecystitis without obstruction: Secondary | ICD-10-CM | POA: Insufficient documentation

## 2016-12-13 DIAGNOSIS — Z902 Acquired absence of lung [part of]: Secondary | ICD-10-CM | POA: Diagnosis not present

## 2016-12-13 DIAGNOSIS — N400 Enlarged prostate without lower urinary tract symptoms: Secondary | ICD-10-CM | POA: Diagnosis not present

## 2016-12-13 DIAGNOSIS — I700 Atherosclerosis of aorta: Secondary | ICD-10-CM | POA: Insufficient documentation

## 2016-12-13 DIAGNOSIS — I714 Abdominal aortic aneurysm, without rupture: Secondary | ICD-10-CM | POA: Insufficient documentation

## 2016-12-13 DIAGNOSIS — J439 Emphysema, unspecified: Secondary | ICD-10-CM | POA: Insufficient documentation

## 2016-12-13 DIAGNOSIS — C3412 Malignant neoplasm of upper lobe, left bronchus or lung: Secondary | ICD-10-CM | POA: Insufficient documentation

## 2016-12-13 DIAGNOSIS — I2510 Atherosclerotic heart disease of native coronary artery without angina pectoris: Secondary | ICD-10-CM | POA: Insufficient documentation

## 2016-12-13 DIAGNOSIS — K5730 Diverticulosis of large intestine without perforation or abscess without bleeding: Secondary | ICD-10-CM | POA: Diagnosis not present

## 2016-12-13 MED ORDER — IOPAMIDOL (ISOVUE-300) INJECTION 61%
100.0000 mL | Freq: Once | INTRAVENOUS | Status: AC | PRN
  Administered 2016-12-13: 100 mL via INTRAVENOUS

## 2016-12-13 MED ORDER — IOPAMIDOL (ISOVUE-370) INJECTION 76%
INTRAVENOUS | Status: AC
  Filled 2016-12-13: qty 100

## 2016-12-14 ENCOUNTER — Encounter: Payer: Self-pay | Admitting: Surgery

## 2016-12-19 ENCOUNTER — Telehealth: Payer: Self-pay | Admitting: Internal Medicine

## 2016-12-19 ENCOUNTER — Encounter: Payer: Self-pay | Admitting: Internal Medicine

## 2016-12-19 ENCOUNTER — Ambulatory Visit (HOSPITAL_BASED_OUTPATIENT_CLINIC_OR_DEPARTMENT_OTHER): Payer: Medicare Other | Admitting: Internal Medicine

## 2016-12-19 VITALS — BP 155/88 | HR 88 | Temp 98.5°F | Resp 18 | Ht 64.0 in | Wt 142.3 lb

## 2016-12-19 DIAGNOSIS — C3412 Malignant neoplasm of upper lobe, left bronchus or lung: Secondary | ICD-10-CM | POA: Diagnosis not present

## 2016-12-19 DIAGNOSIS — C7931 Secondary malignant neoplasm of brain: Secondary | ICD-10-CM

## 2016-12-19 NOTE — Telephone Encounter (Signed)
Appointments scheduled per 12/19/16 los. Patient was given a copy of the AVS report and appointment schedule per 12/19/16 los. °

## 2016-12-19 NOTE — Telephone Encounter (Signed)
2 bottles of contrast given to patient with copy of instructions.

## 2016-12-19 NOTE — Progress Notes (Signed)
[facility]  [phone] [phone]) [phone] OFFICE PROGRESS NOTE  Jeoffrey Massed, MD [address] [place] [address]  DIAGNOSIS: Metastatic non-small cell lung cancer initially diagnosed as Stage IIIA (T2a., N2, M0) non-small cell lung cancer adenocarcinoma with negative EGFR mutation and negative ALK gene translocation involving the left upper lobe middle mediastinal lymphadenopathy diagnosed in August of 2014. He had metastatic disease to the brain diagnosed in December 2016.  Lung cancer   Primary site: Lung (Left)   Staging method: AJCC 7th Edition   Clinical: Stage IIIA (T2a, N2, M0) signed by Si Gaul, MD on 06/19/2013  5:06 PM   Summary: Stage IIIA (T2a, N2, M0).  Molecular biomarkers: Positive for [id number]*7, PTPN11 G503V, ATM splice site [id number]>TT, TP53 E271K, SETD2 N145fs*17 and SPOP E47K                                          Negative for: RET, ALK, BRAF, KRAS, ERBB2, MET and EGFR.  PRIOR THERAPY:  1) Status post left upper lobectomy with mediastinal lymph node dissection on 05/29/2013. 2) Adjuvant chemotherapy with cisplatin at 75 mg per meter squared and Alimta at 500 mg per meter squared given every 3 weeks for a total of 4 cycles. First cycle given on 07/10/2013 3) curative radiotherapy to the mediastinum under the care of Dr. Mitzi Hansen completed on 12/12/2013. 4) status post stereotactic radiotherapy to the metastatic brain lesion on 08/25/2015 under the care of Dr. Mitzi Hansen.  CURRENT THERAPY: Observation.  INTERVAL HISTORY: Jordan Mccullough 76 y.o. male returns to the clinic today for follow-up visit accompanied by his wife. The patient is feeling fine today with no specific complaints except for mild hoarseness of his worsening addition to postnasal drainage. The patient also has some weakness in his lower extremities. He denied having any chest pain, shortness of breath, cough or hemoptysis. He has no fever or chills. He  denied having any nausea, vomiting, diarrhea or constipation. He had repeat CT scan of the chest, abdomen and pelvis performed recently and he is here today for evaluation and discussion of his scan results.  MEDICAL HISTORY: Past Medical History:  Diagnosis Date  . AAA (abdominal aortic aneurysm) (HCC)   . Asymptomatic cholelithiasis 07/2015   Incidental finding on PET CT  . Back pain 04/19/2016  . Coronary artery disease    MI 1992, S/P  PTCA; negative stress test in November 2011 with no ischemia.   . Diverticulosis   . Hearing loss    Bilateral   . History of radiation therapy 11/10/13-12/12/13   lung,50Gy/61fx  . Hyperlipidemia    Crestor= myalgias  . Hypertension   . Lung cancer (HCC) 05/2013   non-small cell;  L upper lobectomy with mediastinal LN dissection, chemo, and radiation in 2014/2015. Remission until pt had questionable seizure 07/2015--MRI showed brain mets; palliative brain rad (stereotactic radiation therapy) started 08/25/15.  CT C/A/P clear 02/2016 and 05/2016.  Plan per onc is to repeat in 6 mo.  . Malignant neoplasm of upper lobe, left bronchus or lung (HCC) 10/29/2013  . Malignant neoplasm of upper lobe, left bronchus or lung (HCC) 06/26/2013  . Myocardial infarction [facility]) 1992   Dr Clifton James    . Peripheral vascular disease (HCC) 08/2012   4.8x4.6 cm infrarenal abdominal aortic fusiform aneurysm, 1.5 cm right common iliac artery aneurysm.  Type II endoleak from  inferior mesenteric artery--Vasc surg referred pt to interv rad for possible embolization of the leak as of 07/17/16.  . S/P radiation therapy [facility] 08/25/15   Stereotactic radiation therapy: frontoparietal 18gy,posterior frontal lobe 20gy,  . Seizure disorder ([facility]) 2016/2017   as sequela of brain mets;  Grand mal seizure 07/2015, then got on keppra and was seizure-free until focal motor seizures of L side of face began 02/2016- these responded well to up-titration of keppra but pt had adverse side effects so  eventually pt had to be switched over to lamictal 07/2016.  Marland Kitchen Shortness of breath   . Tobacco dependence    "Quit" 1992, but pt has smoked "on and off" since that time    ALLERGIES:  is allergic to fluvirin [influenza vac typ a&b surf ant]; rosuvastatin; and iodinated diagnostic agents.  MEDICATIONS:  Current Outpatient Prescriptions  Medication Sig Dispense Refill  . aspirin EC 81 MG tablet Take 81 mg by mouth daily.    Marland Kitchen atorvastatin (LIPITOR) 20 MG tablet TAKE 1 TABLET BY MOUTH DAILY 30 tablet 5  . budesonide-formoterol (SYMBICORT) 160-4.5 MCG/ACT inhaler Inhale 1-2 puffs into the lings every 12 hours. Gargle and Spit after use. 10.2 Inhaler 3  . Glucosamine-Chondroit-Vit C-Mn (GLUCOSAMINE 1500 COMPLEX PO) Take 1,500 mg by mouth daily.     Marland Kitchen lamoTRIgine (LAMICTAL) 200 MG tablet Take 1 tablet (200 mg) by mouth in the morning. Take 1.5 tablets ( 300 mg) tablets by mouth in the evening. 72 tablet 5  . metoprolol (LOPRESSOR) 50 MG tablet Take 1 tablet (50 mg total) by mouth 2 (two) times daily. 180 tablet 1  . Multiple Vitamin (MULTIVITAMIN) tablet Take 1 tablet by mouth every evening.     . pentoxifylline (TRENTAL) 400 MG CR tablet Take 1 tablet (400 mg total) by mouth 3 (three) times daily. 90 tablet 5  . vitamin E 1000 UNIT capsule Take 1 capsule (1,000 Units total) by mouth daily. 30 capsule 12  . amLODipine (NORVASC) 5 MG tablet TAKE 1 TABLET (5 MG TOTAL) BY MOUTH DAILY. 90 tablet 3  . levETIRAcetam (KEPPRA XR) 500 MG 24 hr tablet      No current facility-administered medications for this visit.     SURGICAL HISTORY:  Past Surgical History:  Procedure Laterality Date  . ABDOMINAL AORTIC ENDOVASCULAR STENT GRAFT N/A 05/07/2014   Procedure: ABDOMINAL AORTIC ENDOVASCULAR STENT GRAFT;  Surgeon: Serafina Mitchell, MD;  Location: [facility];  Service: Vascular;  Laterality: N/A;  . Carotid duplex dopplers  07/2015   1-39% on R, no signif dz noted on L  . COLONOSCOPY W/ POLYPECTOMY  2003     negative 2010,due 2020; Dr Olevia Perches  . CORONARY ANGIOPLASTY     no stents  . CYSTOSCOPY/RETROGRADE/URETEROSCOPY Bilateral 10/09/2012   Procedure: BILATERAL RETROGRADE bladder and urethral BIOPSY ;  Surgeon: Molli Hazard, MD;  Location: [facility];  Service: Urology;  Laterality: Bilateral;  BILATERAL RETROGRADE   . EEG  08/05/15   Pt placed on keppra just prior to this test due to having ? seizure (MRI showed brain mets)  . HERNIA REPAIR Bilateral    Inguinal  . INGUINAL HERIIORRHAPHY BILATERALLY    . IR GENERIC HISTORICAL  07/20/2016   IR RADIOLOGIST EVAL & MGMT 07/20/2016 [facility] INTERV RAD  . IR GENERIC HISTORICAL  08/02/2016   IR RADIOLOGIST EVAL & MGMT 08/02/2016 Sandi Mariscal, MD [facility] INTERV RAD  . MEDIASTINOSCOPY N/A 05/01/2013   Procedure: MEDIASTINOSCOPY;  Surgeon: Gaye Pollack, MD;  Location:  [facility];  Service: Thoracic;  Laterality: N/A;  . PFTs  04/2013   Minimal obstructive airway disease  . PILONIDAL CYST EXCISION    . PROSTATE BIOPSY N/A 10/09/2012   Procedure: PROSTATIC URETHRAL BIOPSY;  Surgeon: Molli Hazard, MD;  Location: [facility];  Service: Urology;  Laterality: N/A;  PROSTATIC URETHRAL BIOPSY   . PTCA  1992  . ROTATOR CUFF REPAIR     Bilateral  . THOROCOTOMY WITH LOBECTOMY Left 05/29/2013   Procedure: LEFT THOROCOTOMY WITH LEFT UPPER LOBE LOBECTOMY;  Surgeon: Gaye Pollack, MD;  Location: [facility];  Service: Thoracic;  Laterality: Left;  Marland Kitchen VIDEO BRONCHOSCOPY N/A 05/01/2013   Procedure: VIDEO BRONCHOSCOPY;  Surgeon: Gaye Pollack, MD;  Location: [facility];  Service: Thoracic;  Laterality: N/A;    REVIEW OF SYSTEMS:  A comprehensive review of systems was negative except for: Constitutional: positive for fatigue Ears, nose, mouth, throat, and face: positive for hoarseness Musculoskeletal: positive for muscle weakness   PHYSICAL EXAMINATION: General appearance: alert, cooperative, fatigued and no distress Head: Normocephalic, without obvious abnormality,  atraumatic Neck: no adenopathy, no JVD, supple, symmetrical, trachea midline and thyroid not enlarged, symmetric, no tenderness/mass/nodules Lymph nodes: Cervical, supraclavicular, and axillary nodes normal. Resp: clear to auscultation bilaterally Back: symmetric, no curvature. ROM normal. No CVA tenderness. Cardio: regular rate and rhythm, S1, S2 normal, no murmur, click, rub or gallop GI: soft, non-tender; bowel sounds normal; no masses,  no organomegaly Extremities: extremities normal, atraumatic, no cyanosis or edema  ECOG PERFORMANCE STATUS: 1 - Symptomatic but completely ambulatory  Blood pressure (!) 155/88, pulse 88, temperature 98.5 F (36.9 C), temperature source Oral, resp. rate 18, height _0  (1.626 m), weight 142 lb 4.8 oz (64.5 kg), SpO2 98 %.  LABORATORY DATA: Lab Results  Component Value Date   WBC 8.7 12/12/2016   HGB 15.7 12/12/2016   HCT 46.3 12/12/2016   MCV 85.2 12/12/2016   PLT 232 12/12/2016      Chemistry      Component Value Date/Time   NA 141 12/12/2016 1536   K 4.4 12/12/2016 1536   CL 101 09/16/2015 0927   CO2 27 12/12/2016 1536   BUN 16.0 12/12/2016 1536   CREATININE 1.1 12/12/2016 1536      Component Value Date/Time   CALCIUM 10.4 12/12/2016 1536   ALKPHOS 64 12/12/2016 1536   AST 15 12/12/2016 1536   ALT 16 12/12/2016 1536   BILITOT 0.54 12/12/2016 1536       RADIOGRAPHIC STUDIES: Ct Chest W Contrast  Result Date: 12/14/2016 CLINICAL DATA:  Left upper lobe lung adenocarcinoma diagnosed August 2014 status post left upper lobectomy and mediastinal lymph node dissection 05/29/2013 with adjuvant chemotherapy, mediastinal radiotherapy and metastatic brain recurrence status post stereotactic radiotherapy in December 2016. Patient presents for restaging on observation. EXAM: CT CHEST, ABDOMEN, AND PELVIS WITH CONTRAST TECHNIQUE: Multidetector CT imaging of the chest, abdomen and pelvis was performed following the standard protocol during bolus  administration of intravenous contrast. CONTRAST:  128m ISOVUE-300 IOPAMIDOL (ISOVUE-300) INJECTION 61% COMPARISON:  05/26/2016 CT chest, abdomen and pelvis. 07/28/2016 CT abdomen/ pelvis. FINDINGS: CT CHEST FINDINGS Cardiovascular: Normal heart size. No significant pericardial fluid/thickening. Left anterior descending and right coronary atherosclerosis. Atherosclerotic nonaneurysmal thoracic aorta. Normal caliber pulmonary arteries. No central pulmonary emboli. Mediastinum/Nodes: No discrete thyroid nodules. Unremarkable esophagus. No pathologically enlarged axillary, mediastinal or hilar lymph nodes. Lungs/Pleura: No pneumothorax. No pleural effusion. Status post left upper lobectomy. There is stable sharply marginated parahilar consolidation in  the left lower lobe with associated mild volume loss and distortion, compatible with radiation fibrosis. Medial right lower lobe 4 mm solid pulmonary nodule is stable since 07/21/2014 and considered benign. No acute consolidative airspace disease or new significant pulmonary nodules. Mild centrilobular emphysema. Musculoskeletal: No aggressive appearing focal osseous lesions. Mild thoracic spondylosis. CT ABDOMEN PELVIS FINDINGS Hepatobiliary: Normal liver size. Hyperenhancing 0.9 cm right liver dome lesion (series 2/ image 47) is stable back to the 04/14/2013 and considered benign, probably a hemangioma. No new liver lesions. Cholelithiasis. Contracted gallbladder. No gallbladder wall thickening or pericholecystic fluid. No biliary ductal dilatation. Pancreas: Normal, with no mass or duct dilation. Spleen: Normal size. No mass. Adrenals/Urinary Tract: Normal adrenals. Stable renal cortical scarring in the lower right kidney. Subcentimeter hypodense renal cortical lesions in both kidneys are too small to characterize and not appreciably changed scattered simple left renal cysts, largest 3.1 cm in the posterior lower left kidney. No new renal lesions. No hydronephrosis.  Stable mild diffuse bladder wall thickening. Stomach/Bowel: Grossly normal stomach. Normal caliber small bowel with no small bowel wall thickening. Normal appendix. Scattered mild colonic diverticulosis, with no large bowel wall thickening or pericolonic fat stranding. Oral contrast reaches the rectum. Vascular/Lymphatic: Aortic atherosclerosis. Infrarenal 5.7 cm (previously 5.9 cm) abdominal aortic aneurysm status post aortobi-iliac stent graft repair with stable contrast extravasation in the left aneurysm sac compatible with type 2 endoleak. Patent portal, splenic, hepatic and renal veins. No pathologically enlarged lymph nodes in the abdomen or pelvis. Reproductive: Moderate prostatomegaly, unchanged. Other: No pneumoperitoneum, ascites or focal fluid collection. Stable bilateral inguinal hernia repair with possible small recurrent fat containing left inguinal hernia. Musculoskeletal: No aggressive appearing focal osseous lesions. Stable moderate L4 and mild L5 vertebral compression fractures. Mild lumbar spondylosis. IMPRESSION: 1. No evidence of local tumor recurrence in the left lung status post left upper lobectomy. Stable parahilar left lower lobe radiation fibrosis. 2. No evidence of metastatic disease in the chest, abdomen or pelvis. 3. Stable 5.7 cm infrarenal abdominal aortic aneurysm status post aortobi-iliac stent graft repair with persistent type 2 endoleak. 4. Additional findings include aortic atherosclerosis, 2 vessel coronary atherosclerosis, mild emphysema, cholelithiasis, mild colonic diverticulosis and chronic mild diffuse bladder wall thickening probably due to chronic bladder outlet obstruction by the moderately enlarged prostate. Electronically Signed   By: Ilona Sorrel M.D.   On: 12/14/2016 08:49   Ct Abdomen Pelvis W Contrast  Result Date: 12/14/2016 CLINICAL DATA:  Left upper lobe lung adenocarcinoma diagnosed August 2014 status post left upper lobectomy and mediastinal lymph node  dissection 05/29/2013 with adjuvant chemotherapy, mediastinal radiotherapy and metastatic brain recurrence status post stereotactic radiotherapy in December 2016. Patient presents for restaging on observation. EXAM: CT CHEST, ABDOMEN, AND PELVIS WITH CONTRAST TECHNIQUE: Multidetector CT imaging of the chest, abdomen and pelvis was performed following the standard protocol during bolus administration of intravenous contrast. CONTRAST:  164m ISOVUE-300 IOPAMIDOL (ISOVUE-300) INJECTION 61% COMPARISON:  05/26/2016 CT chest, abdomen and pelvis. 07/28/2016 CT abdomen/ pelvis. FINDINGS: CT CHEST FINDINGS Cardiovascular: Normal heart size. No significant pericardial fluid/thickening. Left anterior descending and right coronary atherosclerosis. Atherosclerotic nonaneurysmal thoracic aorta. Normal caliber pulmonary arteries. No central pulmonary emboli. Mediastinum/Nodes: No discrete thyroid nodules. Unremarkable esophagus. No pathologically enlarged axillary, mediastinal or hilar lymph nodes. Lungs/Pleura: No pneumothorax. No pleural effusion. Status post left upper lobectomy. There is stable sharply marginated parahilar consolidation in the left lower lobe with associated mild volume loss and distortion, compatible with radiation fibrosis. Medial right lower lobe 4 mm solid  pulmonary nodule is stable since 07/21/2014 and considered benign. No acute consolidative airspace disease or new significant pulmonary nodules. Mild centrilobular emphysema. Musculoskeletal: No aggressive appearing focal osseous lesions. Mild thoracic spondylosis. CT ABDOMEN PELVIS FINDINGS Hepatobiliary: Normal liver size. Hyperenhancing 0.9 cm right liver dome lesion (series 2/ image 47) is stable back to the 04/14/2013 and considered benign, probably a hemangioma. No new liver lesions. Cholelithiasis. Contracted gallbladder. No gallbladder wall thickening or pericholecystic fluid. No biliary ductal dilatation. Pancreas: Normal, with no mass or duct  dilation. Spleen: Normal size. No mass. Adrenals/Urinary Tract: Normal adrenals. Stable renal cortical scarring in the lower right kidney. Subcentimeter hypodense renal cortical lesions in both kidneys are too small to characterize and not appreciably changed scattered simple left renal cysts, largest 3.1 cm in the posterior lower left kidney. No new renal lesions. No hydronephrosis. Stable mild diffuse bladder wall thickening. Stomach/Bowel: Grossly normal stomach. Normal caliber small bowel with no small bowel wall thickening. Normal appendix. Scattered mild colonic diverticulosis, with no large bowel wall thickening or pericolonic fat stranding. Oral contrast reaches the rectum. Vascular/Lymphatic: Aortic atherosclerosis. Infrarenal 5.7 cm (previously 5.9 cm) abdominal aortic aneurysm status post aortobi-iliac stent graft repair with stable contrast extravasation in the left aneurysm sac compatible with type 2 endoleak. Patent portal, splenic, hepatic and renal veins. No pathologically enlarged lymph nodes in the abdomen or pelvis. Reproductive: Moderate prostatomegaly, unchanged. Other: No pneumoperitoneum, ascites or focal fluid collection. Stable bilateral inguinal hernia repair with possible small recurrent fat containing left inguinal hernia. Musculoskeletal: No aggressive appearing focal osseous lesions. Stable moderate L4 and mild L5 vertebral compression fractures. Mild lumbar spondylosis. IMPRESSION: 1. No evidence of local tumor recurrence in the left lung status post left upper lobectomy. Stable parahilar left lower lobe radiation fibrosis. 2. No evidence of metastatic disease in the chest, abdomen or pelvis. 3. Stable 5.7 cm infrarenal abdominal aortic aneurysm status post aortobi-iliac stent graft repair with persistent type 2 endoleak. 4. Additional findings include aortic atherosclerosis, 2 vessel coronary atherosclerosis, mild emphysema, cholelithiasis, mild colonic diverticulosis and chronic mild  diffuse bladder wall thickening probably due to chronic bladder outlet obstruction by the moderately enlarged prostate. Electronically Signed   By: Ilona Sorrel M.D.   On: 12/14/2016 08:49   ASSESSMENT/PLAN:  This is a very pleasant 76 years old white male with metastatic non-small cell lung cancer that was initially diagnosed as a stage IIIa adenocarcinoma status post left upper lobectomy with mediastinal lymph node dissection followed by adjuvant systemic chemotherapy with cisplatin and Alimta. The patient also received stereotactic radiotherapy to metastatic brain lesions in January 2017. He is currently on observation and the recent CT scan of the chest, abdomen and pelvis showed no evidence for disease recurrence. I discussed the scan results with the patient and his wife. I recommended for him to continue on observation with repeat CT scan of the chest, abdomen and pelvis in 6 months. He was advised to call immediately if he has any concerning symptoms in the interval. All questions were answered. The patient knows to call the clinic with any problems, questions or concerns. We can certainly see the patient much sooner if necessary. I spent 10 minutes counseling the patient face to face. The total time spent in the appointment was 15 minutes.  Disclaimer: This note was dictated with voice recognition software. Similar sounding words can inadvertently be transcribed and may not be corrected upon review.

## 2016-12-21 ENCOUNTER — Encounter: Payer: Self-pay | Admitting: Neurology

## 2016-12-22 ENCOUNTER — Ambulatory Visit: Payer: Medicare Other | Admitting: Neurology

## 2016-12-27 ENCOUNTER — Ambulatory Visit (INDEPENDENT_AMBULATORY_CARE_PROVIDER_SITE_OTHER): Payer: Medicare Other | Admitting: Neurology

## 2016-12-27 ENCOUNTER — Encounter: Payer: Self-pay | Admitting: Neurology

## 2016-12-27 ENCOUNTER — Ambulatory Visit
Admission: RE | Admit: 2016-12-27 | Discharge: 2016-12-27 | Disposition: A | Discharge status: Home / Self Care | Payer: Medicare Other | Source: Ambulatory Visit | Attending: Neurology | Admitting: Neurology

## 2016-12-27 VITALS — BP 138/74 | HR 77 | Ht 64.0 in | Wt 142.0 lb

## 2016-12-27 DIAGNOSIS — G40209 Localization-related (focal) (partial) symptomatic epilepsy and epileptic syndromes with complex partial seizures, not intractable, without status epilepticus: Secondary | ICD-10-CM | POA: Diagnosis not present

## 2016-12-27 DIAGNOSIS — M25511 Pain in right shoulder: Secondary | ICD-10-CM

## 2016-12-27 DIAGNOSIS — S4991XA Unspecified injury of right shoulder and upper arm, initial encounter: Secondary | ICD-10-CM | POA: Diagnosis not present

## 2016-12-27 MED ORDER — LAMOTRIGINE 200 MG PO TABS: ORAL_TABLET | ORAL | 11 refills | Status: DC

## 2016-12-27 NOTE — Progress Notes (Signed)
NEUROLOGY FOLLOW UP OFFICE NOTE  Jordan Mccullough [id number]  HISTORY OF PRESENT ILLNESS: I had the pleasure of seeing Jordan Mccullough in follow-up in the neurology clinic on 12/27/2016. The patient was last seen 3 months ago after he had a new onset convulsive seizure last 08/04/15 and was found to have brain metastases to the right frontal lobe. He was started on Keppra and did well seizure-free for almost 7 months until he had focal motor seizures affecting the left side of his face at the beginning of July 2017. Keppra dose was increased to '1000mg'$  qhs but he had difficulties with drowsiness and sleepiness even after naps. He was started on Lamotrigine and given a tapering schedule for Keppra. He had two facial seizures during the taper in March and Lamotrigine dose was increased to '200mg'$  BID. He was completely off the [place] last month then reported two facial seizures on 12/21/16. Lamotrigine dose increased further to '300mg'$  BID. He became too dizzy the next day and reduced back to '300mg'$  in AM, '200mg'$  in PM with resolution of dizziness. He reports that with the left facial seizure last Thursday he could feel the trembling inside his cheek but it was not visible to the person he was talking to. He kept talking, the episode was brief lasting 20 seconds, no associated confusion, extremities not involved. He reports that his amblyopia is actually better since starting Lamotrigine. He does not have the drowsiness he had on Keppra, in fact it gives him more energy. He has always had sleep difficulties, he sleeps in 3-4 hour shifts. He has noticed stress can provoke seizures. He had a fall while fixing his airplane last Saturday, he fell on his right shoulder and cannot lift it above 90 degrees. No numbness/tingling. He was able to drive home then was in tremendous pain, soaking wet with sweat, and passed out very briefly for 10-20 seconds while sitting. No post-event confusion. He denies any headaches, dizziness,  diplopia, dysarthria, dysphagia, olfactory/gustatory hallucinations, myoclonic jerks.   MRI brain with and without contrast done 07/20/16 showed stable appearance of the 2 treated cystic lesions in the right parietal lobe. No evidence of tumor growth. Slightly less right hemispheric edema. No new lesion.  HPI 10/11/15: This is a pleasant 76 yo RH man with a history of lung cancer s/p lobectomy, chemotherapy and radiation, hyperlipidemia, CAD s/p MI, peripheral vascular disease, abdominal aortic aneurysm, admitted to [facility] last 08/04/15 after a new onset seizure and found to have brain metastases. He states he got his flu shot then went to work, got home and started having a beer the threw up, his eyes got blurry to the point he could not see anything, then has no recollection of events. According to ER notes, after the nausea and vomiting, he decided to drink beer to feel better, his wife left the room then came back to find him with arms extended, shaking, eyes rolled back lasting 5-10 seconds. He recalls coming to without feeling confused, no focal weakness. He was brought to [facility] ER where CT head showed a 1.6 x 1.5 cm hyperdense mass in the right frontal lobe worrisome for focal metastases. MRI was done showing 2 enhancing masses in the right frontal lobe measuring up to 2 x 1.7 cm, no mass effect. He was started on Keppra. He underwent stereotactic radiation.   He called our office in July 2017 to report facial seizures. He had 5 in one day lasting 5-6 seconds, then stopped. His wife called  our office to report 4 facial seizures on 7/19 and one on 7/20, these were lasting 5-6 seconds, he could feel it coming from the left jawline going up to his forehead, with facial twitching seen. No associated headache or confusion. He has a constant feeling of numbness on the left side of his face, "like you had Novocaine." He continues to have left arm and leg weakness that has been chronic, no twitching involved. He  feels he has a hard time controlling them. He denies any falls. Keppra dose was increased to '1000mg'$  qhs.  He denies any prior history of seizures. His wife denies any staring/unresponsive episodes, no gaps in time, olfactory/gustatory hallucinations, deja vu, rising epigastric sensation, focal numbness/tingling/weakness, myoclonic jerks, headaches, dysarthria/dysphagia, bowel/bladder dysfunction. He was discharged home on Decadron and Keppra, and reports that he was "just totally out of it," functioning only 50%. He felt significantly better getting of Decadron, but continued to have generalized weakness, particularly in the legs, blurred vision, and reported this to his PCP Dr. Anitra Lauth. Keppra dose was reduced to '250mg'$  BID 5 days ago, and he reports that symptoms are better, but still there. He states that he wakes up feeling fine, but then starts feeling the symptoms after taking morning Keppra dose. He has mild 2-3 over 10 back pain when walking, relieved when he sits down. He continues to drink beer daily but has reduced to 1-2 a day from 3-5 beers a day. His wife reports he would drink 3 beer cases in a week.  Epilepsy Risk Factors: Brain metastases in the right frontal lobe. Otherwise he had a normal birth and early development. There is no history of febrile convulsions, CNS infections such as meningitis/encephalitis, significant traumatic brain injury, neurosurgical procedures, or family history of seizures.   I personally reviewed MRI brain with and without contrast done 08/12/15 which showed an 8x108m centrally necrotic metastasis in the right posterior frontal lobe; there is a centrally necrotic mass measuring 125mat the right vertex frontoparietal junction with regional vasogenic edema. Routine EEG 08/05/15 was a normal wake and sleep study  PAST MEDICAL HISTORY: Past Medical History:  Diagnosis Date  . AAA (abdominal aortic aneurysm) ([facility]  . Asymptomatic cholelithiasis 07/2015    Incidental finding on PET CT  . Back pain 04/19/2016  . Coronary artery disease    MI 1992, S/P  PTCA; negative stress test in November 2011 with no ischemia.   . Diverticulosis   . Hearing loss    Bilateral   . History of radiation therapy 11/10/13-12/12/13   lung,50Gy/2568f. Hyperlipidemia    Crestor= myalgias  . Hypertension   . Lung cancer ([facility]   non-small cell;  L upper lobectomy with mediastinal LN dissection, chemo, and radiation in 2014/2015. Remission until pt had questionable seizure 07/2015--MRI showed brain mets; palliative brain rad (stereotactic radiation therapy) started 08/25/15.  CT C/A/P clear 02/2016, 05/2016, and 11/2016.  Plan per onc is to repeat in 6 mo.  . Malignant neoplasm of upper lobe, left bronchus or lung ([facility]/06/2014  . Malignant neoplasm of upper lobe, left bronchus or lung ([facility]/01/2013  . [facility]   Dr McAAngelena Form . Peripheral vascular disease ([facility]/2014   4.8x4.6 cm infrarenal abdominal aortic fusiform aneurysm, 1.5 cm right common iliac artery aneurysm.  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  . S/P radiation therapy  [facility] 08/25/15   Stereotactic radiation therapy: frontoparietal 18gy,posterior frontal lobe 20gy,  . Seizure disorder ([place]) 2016/2017   as sequela of brain mets;  Grand mal seizure 07/2015, then got on keppra and was seizure-free until focal motor seizures of L side of face began 02/2016- these responded well to up-titration of keppra but pt had adverse side effects so eventually pt had to be switched over to lamictal 07/2016.  Marland Kitchen Shortness of breath   . Tobacco dependence    "Quit" 1992, but pt has smoked "on and off" since that time    MEDICATIONS:  Outpatient Encounter Prescriptions as of 12/27/2016  Medication Sig  . amLODipine (NORVASC) 5 MG tablet TAKE 1 TABLET (5 MG TOTAL) BY MOUTH DAILY.  Marland Kitchen aspirin EC  81 MG tablet Take 81 mg by mouth daily.  Marland Kitchen atorvastatin (LIPITOR) 20 MG tablet TAKE 1 TABLET BY MOUTH DAILY  . budesonide-formoterol (SYMBICORT) 160-4.5 MCG/ACT inhaler Inhale 1-2 puffs into the lings every 12 hours. Gargle and Spit after use.  . Glucosamine-Chondroit-Vit C-Mn (GLUCOSAMINE 1500 COMPLEX PO) Take 1,500 mg by mouth daily.   Marland Kitchen lamoTRIgine (LAMICTAL) 200 MG tablet Taking 1.5 tablets ( 300 mg) tablets by mouth in the morning, 1 tablet in the morning      . metoprolol (LOPRESSOR) 50 MG tablet Take 1 tablet (50 mg total) by mouth 2 (two) times daily.  . Multiple Vitamin (MULTIVITAMIN) tablet Take 1 tablet by mouth every evening.   . pentoxifylline (TRENTAL) 400 MG CR tablet Take 1 tablet (400 mg total) by mouth 3 (three) times daily.  . vitamin E 1000 UNIT capsule Take 1 capsule (1,000 Units total) by mouth daily.   No facility-administered encounter medications on file as of 12/27/2016.     ALLERGIES: Allergies  Allergen Reactions  . Fluvirin [Influenza Vac Typ A&B Surf Ant] Nausea And Vomiting and Other (See Comments)    Seizure like activity?  Marland Kitchen Rosuvastatin Other (See Comments)    Myalgias and dark urine  . Iodinated Diagnostic Agents Hives    1 hive on lt cheek lasting approximately 1 hour on last 2 CT per pt; needs pre meds in future; 50 mg benadryl po 1 hr prior to exam per Dr. Weber Cooks 07/2014  11/2016 per Tery Sanfilippo, pt needs 13hr premeds per our policy.    FAMILY HISTORY: Family History  Problem Relation Age of Onset  . Hypertension Mother   . Prostate cancer Father     Prostate cancer  . Cancer Father     Brain Tumor  . Lung cancer Sister     NON SMOKER  . Cancer Sister   . Hyperlipidemia Sister   . Hyperlipidemia Brother   . Heart attack Brother   . Hypertension Brother   . Diabetes Neg Hx   . Stroke Neg Hx     SOCIAL HISTORY: Social History   Social History  . Marital status: Married    Spouse name: N/A  . Number of children: N/A  . Years of  education: N/A   Occupational History  . Retired Risk analyst    Social History Main Topics  . Smoking status: Current Every Day Smoker    Packs/day: 1.00    Years: 24.00    Types: Cigarettes    Last attempt to quit: 05/28/2013  . Smokeless tobacco: Never Used     Comment: QUIT 15 YEARS AGO  . Alcohol use 9.0 oz/week    3 Glasses of wine, 12 Cans of beer per week  Comment: 1-2 beers a day  . Drug use: No  . Sexual activity: Not on file   Other Topics Concern  . Not on file   Social History Narrative   HEART HEALTHY DIET.     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   MARRIED, 2 children who live in [place].  Smoked on and off since then.   ALCOHOL USE -YES- RED WINE SOCIALLY, couple of beers at night usually.    REVIEW OF SYSTEMS: Constitutional: No fevers, chills, or sweats, generalized fatigue, change in appetite Eyes: No visual changes, double vision, eye pain Ear, nose and throat: No hearing loss, ear pain, nasal congestion, sore throat Cardiovascular: No chest pain, palpitations Respiratory:  No shortness of breath at rest or with exertion, wheezes GastrointestinaI: No nausea, vomiting, diarrhea, abdominal pain, fecal incontinence Genitourinary:  No dysuria, urinary retention or frequency Musculoskeletal:  No neck pain, +back pain Integumentary: No rash, pruritus, skin lesions Neurological: as above Psychiatric: No depression,+ insomnia, no anxiety Endocrine: No palpitations, fatigue, diaphoresis, mood swings, change in appetite, change in weight, increased thirst Hematologic/Lymphatic:  No anemia, purpura, petechiae. Allergic/Immunologic: no itchy/runny eyes, nasal congestion, recent allergic reactions, rashes  PHYSICAL EXAM: Vitals:   12/27/16 0954  BP: 138/74  Pulse: 77   General: No acute distress Head:  Normocephalic/atraumatic Neck: supple, no paraspinal tenderness, full range of motion Heart:  Regular rate and rhythm Lungs:  Clear to  auscultation bilaterally Back: No paraspinal tenderness Skin/Extremities: No rash, no edema. No pain to palpation of right shoulder Neurological Exam: alert and oriented to person, place, and time. No aphasia or dysarthria. Fund of knowledge is appropriate.  Recent and remote memory are intact.  Attention and concentration are normal.    Able to name objects and repeat phrases. Cranial nerves: Pupils equal, round, reactive to light. Extraocular movements intact with no nystagmus. Visual fields full. Facial sensation intact. No facial asymmetry. Tongue, uvula, palate midline.  Motor: Bulk and tone normal, muscle strength 5/5 throughout with no pronator drift but has difficulty raising right arm above 90 degrees. Sensation to light touch intact.  No extinction to double simultaneous stimulation.  Deep tendon reflexes 2+ throughout, toes downgoing.  Finger to nose testing intact.  Gait narrow-based and steady without cane (uses cane for balance), no ataxia  IMPRESSION: This is a pleasant 76 yo RH man with a history of lung cancer s/p lobectomy, radiation, chemotherapy, found to have 2 right frontal brain metastases after he had a convulsive seizure on 08/04/15. Routine EEG normal. He started having focal motor seizures affecting the face in July 2017 and difficulty tolerating higher doses of Keppra. He has been switched to Lamotrigine, currently on '300mg'$  in AM, '200mg'$  in PM (did not tolerate higher dose). He had 2 transient simple partial motor seizures affecting the left side of his face that was not clearly visible to witnesses. We discussed that there is some difficulty with completely eradicating simple partial motor seizures, requiring balancing seizure control with side effects. We have agreed to continue on current dose of Lamotrigine and keep a calendar of the seizures. If he starts having confusion or speech difficulties, or a change in the seizures, we may add on another AED. He fell last Saturday and  has had a lot of right shoulder pain with difficulty raising above shoulder level, xray of right shoulder will be ordered. We discussed poor sleep, he will try melatonin '3mg'$  qhs. He is aware of [place] driving laws to stop  driving after a seizure, until 6 months seizure-free. He will follow-up in 3 months and knows to call for any problems.  Thank you for allowing me to participate in his care.  Please do not hesitate to call for any questions or concerns.  The duration of this appointment visit was 25 minutes of face-to-face time with the patient.  Greater than 50% of this time was spent in counseling, explanation of diagnosis, planning of further management, and coordination of care.   Ellouise Newer, M.D.   CC: Dr. Anitra Lauth, Dr. Sherwood Gambler, Dr. Julien Nordmann

## 2016-12-27 NOTE — Patient Instructions (Addendum)
1. Schedule right shoulder xray 2. Try taking melatonin '3mg'$  at bedtime 3. Continue Lamotrigine '200mg'$ : Take 1.5 tablets in AM, 1 tablet at night 4. Follow-up in 3 months, call for any changes  Seizure Precautions: 1. If medication has been prescribed for you to prevent seizures, take it exactly as directed.  Do not stop taking the medicine without talking to your doctor first, even if you have not had a seizure in a long time.   2. Avoid activities in which a seizure would cause danger to yourself or to others.  Don't operate dangerous machinery, swim alone, or climb in high or dangerous places, such as on ladders, roofs, or girders.  Do not drive unless your doctor says you may.  3. If you have any warning that you may have a seizure, lay down in a safe place where you can't hurt yourself.    4.  No driving for 6 months from last seizure, as per [facility].   Please refer to the following link on the [place] website for more information: http://[email or web address]/[email or web address]/drivingu.cfm   5.  Maintain good sleep hygiene. Avoid alcohol.  6.  Contact your doctor if you have any problems that may be related to the medicine you are taking.  7.  Call 911 and bring the patient back to the ED if:        A.  The seizure lasts longer than 5 minutes.       B.  The patient doesn't awaken shortly after the seizure  C.  The patient has new problems such as difficulty seeing, speaking or moving  D.  The patient was injured during the seizure  E.  The patient has a temperature over 102 F (39C)  F.  The patient vomited and now is having trouble breathing

## 2016-12-28 ENCOUNTER — Telehealth: Payer: Self-pay

## 2016-12-28 NOTE — Telephone Encounter (Signed)
LMOM relaying message below.  

## 2016-12-28 NOTE — Telephone Encounter (Signed)
-----   Message from Cameron Sprang, MD sent at 12/27/2016  2:18 PM EDT ----- Pls let him and wife know shoulder xray shows arthritis changes, but no evidence of fracture or dislocation. If pain continues, call Dr. Anitra Lauth. Thanks

## 2017-01-21 ENCOUNTER — Encounter: Payer: Self-pay | Admitting: Surgery

## 2017-01-24 ENCOUNTER — Encounter: Payer: Self-pay | Admitting: Surgery

## 2017-01-29 ENCOUNTER — Ambulatory Visit (INDEPENDENT_AMBULATORY_CARE_PROVIDER_SITE_OTHER): Payer: Medicare Other | Admitting: Surgery

## 2017-01-29 ENCOUNTER — Encounter: Payer: Self-pay | Admitting: Surgery

## 2017-01-29 VITALS — BP 138/87 | Temp 99.4°F | Resp 20 | Ht 64.0 in | Wt 138.3 lb

## 2017-01-29 DIAGNOSIS — I714 Abdominal aortic aneurysm, without rupture, unspecified: Secondary | ICD-10-CM

## 2017-01-29 NOTE — Progress Notes (Signed)
Vascular and Vein Specialist of [place]  Patient name: Jordan Mccullough MRN: [id number] DOB: 07/16/1941 Sex: male   REASON FOR VISIT:    Follow-up AAA  HISOTRY OF PRESENT ILLNESS:     Jordan Mccullough is a 76 y.o. male who returns today for follow up.  He is status post endovascular aneurysm repair on 05/07/2014. 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  He has left-sided weakness from his brain met which has been treated.   I had previously sent in to see Dr. Pascal Lux with interventional radiology for his endoleak.  No intervention has been recommended because of the slow progression of the aneurysm sac.  The patient continues to be without abdominal pain. PAST MEDICAL HISTORY:   Past Medical History:  Diagnosis Date  . AAA (abdominal aortic aneurysm) ([place])   . Asymptomatic cholelithiasis 07/2015   Incidental finding on PET CT  . Back pain 04/19/2016  . Coronary artery disease    MI 1992, S/P  PTCA; negative stress test in November 2011 with no ischemia.   . Diverticulosis   . Hearing loss    Bilateral   . History of radiation therapy 11/10/13-12/12/13   lung,50Gy/31f  . Hyperlipidemia    Crestor= myalgias  . Hypertension   . Lung cancer ([facility] 05[place]   non-small cell;  L upper lobectomy with mediastinal LN dissection, chemo, and radiation in 2014/2015. Remission until pt had questionable seizure 07/2015--MRI showed brain mets; palliative brain rad (stereotactic radiation therapy) started 08/25/15.  CT C/A/P clear 02/2016, 05/2016, and 11/2016.  Plan per onc is to repeat in 6 mo.  . Malignant ne[place] of upper lobe, [place] bronchus or lung ([place] 10/29/[place]  . Malignant neoplasm of upper lobe, left bronchus or lung ([facility] 06/26/2013  . Myocardial infarction ([facility] 1992   Dr MAngelena Form   . Peripheral vascular disease ([facility] 08/2012   4.8x4.6 cm infrarenal abdominal aortic fusiform aneurysm, 1.5 cm right common iliac  artery aneurysm.  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  . [facility]   Stereotactic radiation therapy: frontoparietal 18gy,posterior frontal lobe 20gy,  . Seizure disorder ([facility] 2016/2017   as sequela of brain mets;  Grand mal seizure 07/2015, then got on keppra and was seizure-free until focal motor seizures of L side of face began 02/2016- these responded well to up-titration of keppra but pt had adverse side effects so eventually pt had to be switched over to lamictal 07/2016.  .Marland KitchenShortness of breath   . Tobacco dependence    "Quit" 1992, but pt has smoked "on and off" since that time     FAMILY HISTORY:   Family History  Problem Relation Age of Onset  . Hypertension Mother   . Prostate cancer Father        Prostate cancer  . Cancer Father        Brain Tumor  . Lung cancer Sister        NON SMOKER  . Cancer Sister   . Hyperlipidemia Sister   . Hyperlipidemia Brother   . Heart attack Brother   . Hypertension Brother   . Diabetes Neg Hx   . Stroke Neg Hx     SOCIAL HISTORY:   Social History  Substance Use Topics  . Smoking status: Current Every Day Smoker    Packs/day: 1.00    Years: 24.00  Types: Cigarettes    Last attempt to quit: 05/28/2013  . Smokeless tobacco: Never Used     Comment: QUIT 15 YEARS AGO  . Alcohol use 9.0 oz/week    3 Glasses of wine, 12 Cans of beer per week     Comment: 1-2 beers a day     ALLERGIES:   Allergies  Allergen Reactions  . Fluvirin [Influenza Vac Split Quad] Nausea And Vomiting and Other (See Comments)    Seizure like activity?  Marland Kitchen Rosuvastatin Other (See Comments)    Myalgias and dark urine  . Iodinated Diagnostic Agents Hives    1 hive on lt cheek lasting approximately 1 hour on last 2 CT per pt; needs pre meds in future; 50 mg benadryl po 1 hr prior to exam per Dr. Weber Cooks 07/2014  11/2016 per  Tery Sanfilippo, pt needs 13hr premeds per our policy.     CURRENT MEDICATIONS:   Current Outpatient Prescriptions  Medication Sig Dispense Refill  . amLODipine (NORVASC) 5 MG tablet TAKE 1 TABLET (5 MG TOTAL) BY MOUTH DAILY. 90 tablet 3  . aspirin EC 81 MG tablet Take 81 mg by mouth daily.    Marland Kitchen atorvastatin (LIPITOR) 20 MG tablet TAKE 1 TABLET BY MOUTH DAILY 30 tablet 5  . budesonide-formoterol (SYMBICORT) 160-4.5 MCG/ACT inhaler Inhale 1-2 puffs into the lings every 12 hours. Gargle and Spit after use. 10.2 Inhaler 3  . Glucosamine-Chondroit-Vit C-Mn (GLUCOSAMINE 1500 COMPLEX PO) Take 1,500 mg by mouth daily.     Marland Kitchen lamoTRIgine (LAMICTAL) 200 MG tablet Take 1.5 tablets ( 300 mg) tablets by mouth in the morning, 1 tablet (226m) in the evening 75 tablet 11  . metoprolol (LOPRESSOR) 50 MG tablet Take 1 tablet (50 mg total) by mouth 2 (two) times daily. 180 tablet 1  . Multiple Vitamin (MULTIVITAMIN) tablet Take 1 tablet by mouth every evening.     . pentoxifylline (TRENTAL) 400 MG CR tablet Take 1 tablet (400 mg total) by mouth 3 (three) times daily. 90 tablet 5  . vitamin E 1000 UNIT capsule Take 1 capsule (1,000 Units total) by mouth daily. 30 capsule 12   No current facility-administered medications for this visit.     REVIEW OF SYSTEMS:   _0  denotes positive finding, _1  denotes negative finding Cardiac  Comments:  Chest pain or chest pressure:    Shortness of breath upon exertion:    Short of breath when lying flat:    Irregular heart rhythm:        Vascular    Pain in calf, thigh, or hip brought on by ambulation:    Pain in feet at night that wakes you up from your sleep:     Blood clot in your veins:    Leg swelling:         Pulmonary    Oxygen at home:    Productive cough:     Wheezing:         Neurologic    Sudden weakness in arms or legs:     Sudden numbness in arms or legs:     Sudden onset of difficulty speaking or slurred speech:    Temporary loss of vision in one  eye:     Problems with dizziness:         Gastrointestinal    Blood in stool:     Vomited blood:         Genitourinary    Burning when urinating:     Blood in  urine:        Psychiatric    Major depression:         Hematologic    Bleeding problems:    Problems with blood clotting too easily:        Skin    Rashes or ulcers:        Constitutional    Fever or chills:      PHYSICAL EXAM:   Vitals:   01/29/17 1030 01/29/17 1033  BP: (!) 146/85 138/87  Resp: 20   Temp: 99.4 F (37.4 C)   TempSrc: Oral   SpO2: 96%   Weight: 138 lb 4.8 oz (62.7 kg)   Height: _0  (1.626 m)     GENERAL: The patient is a well-nourished male, in no acute distress. The vital signs are documented above. CARDIAC: There is a regular rate and rhythm.  PULMONARY: Non-labored respirations ABDOMEN: Soft and non-tender  MUSCULOSKELETAL: There are no major deformities or cyanosis. NEUROLOGIC: No focal weakness or paresthesias are detected. SKIN: There are no ulcers or rashes noted. PSYCHIATRIC: The patient has a normal affect.  STUDIES:   I have reviewed his CT scan with the following findings: Stable 5.7 cm infrarenal abdominal aortic aneurysm status post aortobi-iliac stent graft repair with persistent type 2 endoleak.  MEDICAL ISSUES:   AAA:His most recent CT scan shows relatively stable aneurysm sac.  This was not a CT angiogram, and so the endoleak is difficult to characterize.  The patient is scheduled to have follow-up CT scan for his cancer in October.  I will try to protocol these as a CT angiogram so that I can better evaluate the endoleak.  If it is not possible to do this, he will need a separate order for CT Joetta Manners of the abdomen and pelvis    Annamarie Major, MD [facility] and Vein Specialists of [facility] [phone] Pager [phone]

## 2017-02-01 ENCOUNTER — Other Ambulatory Visit: Payer: Medicare Other

## 2017-02-05 ENCOUNTER — Ambulatory Visit: Payer: Self-pay | Admitting: Radiation Oncology

## 2017-02-06 NOTE — Progress Notes (Signed)
Jordan Mccullough. Jordan Mccullough 76 y.o. man with metastatic non-small cell lung cancer who received SRS to 2 lesions in the frontoparietal region radiation completed 08-25-15, review 02-12-17  MRI brain w wo contrast, FU.   Headache:yes 1/10 Ibuprofen started two days ago after the MRI Pain: Headache  1/10 Ibuprofen started two days ago after the MRI Dizziness:No Nausea/Vomiting/Ataxia:No Visional changes(Blurred/double vision, blind spots and peripheral vision changes): wears glasses, got new glasses. Ring in ears:None, HOH left ear has a hearing aid does not wear Fatigue:Reports fatigue takes a nap as needed. Cognitive changes: Alert and oriented x 3 with fluent speech,able to complete sentences without difficulty with word finding or organization of sentences. Weight: Wt Readings from Last 3 Encounters:  02/14/17 138 lb 9.6 oz (62.9 kg)  01/29/17 138 lb 4.8 oz (62.7 kg)  12/27/16 142 lb (64.4 kg)    Appetite: Good eating two meals per day. Respiratory complaints: No issues Imaging: 02-12-17 MRI brain w wo contrast Labs: 12-29-16 CBC w diff,Cmet   12-19-16  Saw Dr. Julien Nordmann  recommended for him to continue on observation with repeat CT scan of the chest, abdomen and pelvis in 6 months

## 2017-02-12 ENCOUNTER — Ambulatory Visit
Admission: RE | Admit: 2017-02-12 | Discharge: 2017-02-12 | Disposition: A | Discharge status: Home / Self Care | Payer: Medicare Other | Source: Ambulatory Visit | Attending: Radiation Oncology | Admitting: Radiation Oncology

## 2017-02-12 DIAGNOSIS — C7931 Secondary malignant neoplasm of brain: Secondary | ICD-10-CM | POA: Diagnosis not present

## 2017-02-12 MED ORDER — GADOBENATE DIMEGLUMINE 529 MG/ML IV SOLN
13.0000 mL | Freq: Once | INTRAVENOUS | Status: AC | PRN
  Administered 2017-02-12: 13 mL via INTRAVENOUS

## 2017-02-14 ENCOUNTER — Ambulatory Visit
Admission: RE | Admit: 2017-02-14 | Discharge: 2017-02-14 | Disposition: A | Discharge status: Home / Self Care | Payer: Medicare Other | Source: Ambulatory Visit | Attending: Urology | Admitting: Urology

## 2017-02-14 ENCOUNTER — Encounter: Payer: Self-pay | Admitting: Urology

## 2017-02-14 VITALS — BP 135/80 | HR 77 | Temp 98.4°F | Resp 18 | Ht 64.0 in | Wt 138.6 lb

## 2017-02-14 DIAGNOSIS — Z887 Allergy status to serum and vaccine status: Secondary | ICD-10-CM | POA: Diagnosis not present

## 2017-02-14 DIAGNOSIS — I252 Old myocardial infarction: Secondary | ICD-10-CM | POA: Diagnosis not present

## 2017-02-14 DIAGNOSIS — Z91041 Radiographic dye allergy status: Secondary | ICD-10-CM | POA: Diagnosis not present

## 2017-02-14 DIAGNOSIS — Z9889 Other specified postprocedural states: Secondary | ICD-10-CM | POA: Insufficient documentation

## 2017-02-14 DIAGNOSIS — H9193 Unspecified hearing loss, bilateral: Secondary | ICD-10-CM | POA: Insufficient documentation

## 2017-02-14 DIAGNOSIS — Z8042 Family history of malignant neoplasm of prostate: Secondary | ICD-10-CM | POA: Diagnosis not present

## 2017-02-14 DIAGNOSIS — F17210 Nicotine dependence, cigarettes, uncomplicated: Secondary | ICD-10-CM | POA: Insufficient documentation

## 2017-02-14 DIAGNOSIS — G40509 Epileptic seizures related to external causes, not intractable, without status epilepticus: Secondary | ICD-10-CM | POA: Diagnosis not present

## 2017-02-14 DIAGNOSIS — I10 Essential (primary) hypertension: Secondary | ICD-10-CM | POA: Insufficient documentation

## 2017-02-14 DIAGNOSIS — Z808 Family history of malignant neoplasm of other organs or systems: Secondary | ICD-10-CM | POA: Diagnosis not present

## 2017-02-14 DIAGNOSIS — Z85118 Personal history of other malignant neoplasm of bronchus and lung: Secondary | ICD-10-CM | POA: Diagnosis not present

## 2017-02-14 DIAGNOSIS — Z955 Presence of coronary angioplasty implant and graft: Secondary | ICD-10-CM | POA: Insufficient documentation

## 2017-02-14 DIAGNOSIS — E785 Hyperlipidemia, unspecified: Secondary | ICD-10-CM | POA: Insufficient documentation

## 2017-02-14 DIAGNOSIS — Z7982 Long term (current) use of aspirin: Secondary | ICD-10-CM | POA: Insufficient documentation

## 2017-02-14 DIAGNOSIS — Z8679 Personal history of other diseases of the circulatory system: Secondary | ICD-10-CM | POA: Diagnosis not present

## 2017-02-14 DIAGNOSIS — C7931 Secondary malignant neoplasm of brain: Secondary | ICD-10-CM | POA: Diagnosis not present

## 2017-02-14 DIAGNOSIS — C7802 Secondary malignant neoplasm of left lung: Secondary | ICD-10-CM | POA: Insufficient documentation

## 2017-02-14 DIAGNOSIS — I2510 Atherosclerotic heart disease of native coronary artery without angina pectoris: Secondary | ICD-10-CM | POA: Diagnosis not present

## 2017-02-14 DIAGNOSIS — Z801 Family history of malignant neoplasm of trachea, bronchus and lung: Secondary | ICD-10-CM | POA: Insufficient documentation

## 2017-02-14 DIAGNOSIS — Z888 Allergy status to other drugs, medicaments and biological substances status: Secondary | ICD-10-CM | POA: Insufficient documentation

## 2017-02-14 DIAGNOSIS — Z08 Encounter for follow-up examination after completed treatment for malignant neoplasm: Secondary | ICD-10-CM | POA: Diagnosis not present

## 2017-02-14 DIAGNOSIS — Z809 Family history of malignant neoplasm, unspecified: Secondary | ICD-10-CM | POA: Insufficient documentation

## 2017-02-14 DIAGNOSIS — Z8249 Family history of ischemic heart disease and other diseases of the circulatory system: Secondary | ICD-10-CM | POA: Insufficient documentation

## 2017-02-14 NOTE — Progress Notes (Signed)
Radiation Oncology         ([phone]) [phone] ________________________________  Name: Jordan Mccullough MRN: [id number]  Date: 02/14/2017  DOB: 24-May-1941  Follow-Up Visit Note  CC: McGowen, Adrian Blackwater, MD  Gaye Pollack, MD  Diagnosis:   Metastatic Lung Cancer  Interval Since Radiotherapy:  1 year and 5 months  08/25/15 SRS Treatment:    1.  PTV1 Rt Vertex Frontoparietal 58mm target was treated using 4 Arcs to a prescription dose of 18 Gy. ExacTrac Snap verification was performed for each couch angle.  2.  PTV2  Rt Posterior Frontal Lobe 60mm target was treated using 4 Arcs to a prescription dose of 20 Gy. ExacTrac Snap verification was performed for each couch angle.   Narrative:   Mr. Teasdale is a very pleasant 76 y.o. gentleman with a history of metastatic non-small cell lung cancer who received SRS to 2 lesions in the frontoparietal region. He had a repeat MRI of the brain on 36/25/18 that revealed further collapse of a 10 x 18 mm right parietal cystic metastasis that previously was 13 x 18 mm. There was also further collapse of the cortical parietal metastasis measuring , 18 x 12 mm and previously was 20 x 12 mm.  There were similar changes to a few punctate microhemorrhages also noted and stable and stable to improved vasogenic edema. He continues to take vitamin E, and Trental. He is no longer taking Keppra but continues on Lamictal under the care of Dr. Delice Lesch.   On review of systems, the patient reports that he is doing well overall. He denies recent vision changes, increased fatigue, headaches, difficulty with balance or memory problems.  He uses a cane for ambulation and reports no recent falls. He denies any chest pain, shortness of breath, cough, fevers, chills, night sweats, unintended weight changes. He denies any bowel or bladder disturbances, and denies abdominal pain, nausea or vomiting. He denies any new musculoskeletal or joint aches or pains, new skin lesions or concerns. A complete  review of systems is obtained and is otherwise negative.  Past Medical History:  Past Medical History:  Diagnosis Date  . AAA (abdominal aortic aneurysm) ([facility])    s/p repair (aortoiliac bipass; with persistent endograft leak in inferior mesenteric artery)--stable as of 01/2017 vascular f/u.  Marland Kitchen Asymptomatic cholelithiasis 07/2015   Incidental finding on PET CT  . Back pain 04/19/2016  . Coronary artery disease    MI 1992, S/P  PTCA; negative stress test in November 2011 with no ischemia.   . Diverticulosis   . Hearing loss    Bilateral   . History of radiation therapy 11/10/13-12/12/13   lung,50Gy/71fx  . Hyperlipidemia    Crestor= myalgias  . Hypertension   . Lung cancer ([place]) 05/2013   non-small cell;  L upper lobectomy with mediastinal LN dissection, chemo, and radiation in 2014/2015. Remission until pt had questionable seizure 07/2015--MRI showed brain mets; palliative brain rad (stereotactic radiation therapy) started 08/25/15.  CT C/A/P clear 02/2016, 05/2016, and 11/2016.  Plan per onc is to repeat in 6 mo.  . Malignant neoplasm of upper lobe, left bronchus or lung ([place]) 10/29/2013  . Malignant neoplasm of upper lobe, left bronchus or lung ([place]) 06/26/2013  . Myocardial infarction [facility]) 1992   Dr Angelena Form    . Peripheral vascular disease ([place]) 08/2012   4.8x4.6 cm infrarenal abdominal aortic fusiform aneurysm, 1.5 cm right common iliac artery aneurysm.  Type II endoleak from inferior mesenteric artery--Vasc surg referred pt to  interv rad for possible embolization of the leak as of 07/17/16.  . S/P radiation therapy [facility] 08/25/15   Stereotactic radiation therapy: frontoparietal 18gy,posterior frontal lobe 20gy,  . Seizure disorder ([place]) 2016/2017   as sequela of brain mets;  Grand mal seizure 07/2015, then got on keppra and was seizure-free until focal motor seizures of L side of face began 02/2016- these responded well to up-titration of keppra but pt had adverse side effects so eventually pt  had to be switched over to lamictal 07/2016.  Marland Kitchen Shortness of breath   . Tobacco dependence    "Quit" 1992, but pt has smoked "on and off" since that time    Past Surgical History: Past Surgical History:  Procedure Laterality Date  . ABDOMINAL AORTIC ENDOVASCULAR STENT GRAFT N/A 05/07/2014   Procedure: ABDOMINAL AORTIC ENDOVASCULAR STENT GRAFT;  Surgeon: Serafina Mitchell, MD;  Location: [facility];  Service: Vascular;  Laterality: N/A;  . Carotid duplex dopplers  07/2015   1-39% on R, no signif dz noted on L  . COLONOSCOPY W/ POLYPECTOMY  2003    negative 2010,due 2020; Dr Olevia Perches  . CORONARY ANGIOPLASTY     no stents  . CYSTOSCOPY/RETROGRADE/URETEROSCOPY Bilateral 10/09/2012   Procedure: BILATERAL RETROGRADE bladder and urethral BIOPSY ;  Surgeon: Molli Hazard, MD;  Location: [facility];  Service: Urology;  Laterality: Bilateral;  BILATERAL RETROGRADE   . EEG  08/05/15   Pt placed on keppra just prior to this test due to having ? seizure (MRI showed brain mets)  . HERNIA REPAIR Bilateral    Inguinal  . INGUINAL HERIIORRHAPHY BILATERALLY    . IR GENERIC HISTORICAL  07/20/2016   IR RADIOLOGIST EVAL & MGMT 07/20/2016 [facility] INTERV RAD  . IR GENERIC HISTORICAL  08/02/2016   IR RADIOLOGIST EVAL & MGMT 08/02/2016 Sandi Mariscal, MD [facility] INTERV RAD  . MEDIASTINOSCOPY N/A 05/01/2013   Procedure: MEDIASTINOSCOPY;  Surgeon: Gaye Pollack, MD;  Location: [facility] OR;  Service: Thoracic;  Laterality: N/A;  . PFTs  04/2013   Minimal obstructive airway disease  . PILONIDAL CYST EXCISION    . PROSTATE BIOPSY N/A 10/09/2012   Procedure: PROSTATIC URETHRAL BIOPSY;  Surgeon: Molli Hazard, MD;  Location: [facility];  Service: Urology;  Laterality: N/A;  PROSTATIC URETHRAL BIOPSY   . PTCA  1992  . ROTATOR CUFF REPAIR     Bilateral  . THOROCOTOMY WITH LOBECTOMY Left 05/29/2013   Procedure: LEFT THOROCOTOMY WITH LEFT UPPER LOBE LOBECTOMY;  Surgeon: Gaye Pollack, MD;  Location: [facility];  Service: Thoracic;   Laterality: Left;  Marland Kitchen VIDEO BRONCHOSCOPY N/A 05/01/2013   Procedure: VIDEO BRONCHOSCOPY;  Surgeon: Gaye Pollack, MD;  Location: [facility] OR;  Service: Thoracic;  Laterality: N/A;    Social History:  Social History   Social History  . Marital status: Married    Spouse name: N/A  . Number of children: N/A  . Years of education: N/A   Occupational History  . Retired Risk analyst    Social History Main Topics  . Smoking status: Current Every Day Smoker    Packs/day: 1.00    Years: 24.00    Types: Cigarettes    Last attempt to quit: 05/28/2013  . Smokeless tobacco: Never Used     Comment: QUIT 15 YEARS AGO  . Alcohol use 9.0 oz/week    3 Glasses of wine, 12 Cans of beer per week     Comment: 1-2 beers a day  . Drug use: No  .  Sexual activity: Not on file   Other Topics Concern  . Not on file   Social History Narrative   HEART HEALTHY DIET.     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   MARRIED, 2 children who live in [place].  Smoked on and off since then.   ALCOHOL USE -YES- RED WINE SOCIALLY, couple of beers at night usually.    Family History: Family History  Problem Relation Age of Onset  . Hypertension Mother   . Prostate cancer Father        Prostate cancer  . Cancer Father        Brain Tumor  . Lung cancer Sister        NON SMOKER  . Cancer Sister   . Hyperlipidemia Sister   . Hyperlipidemia Brother   . Heart attack Brother   . Hypertension Brother   . Diabetes Neg Hx   . Stroke Neg Hx     ALLERGIES:  is allergic to fluvirin [influenza vac split quad]; rosuvastatin; and iodinated diagnostic agents.  Meds: Current Outpatient Prescriptions  Medication Sig Dispense Refill  . amLODipine (NORVASC) 5 MG tablet TAKE 1 TABLET (5 MG TOTAL) BY MOUTH DAILY. 90 tablet 3  . aspirin EC 81 MG tablet Take 81 mg by mouth daily.    Marland Kitchen atorvastatin (LIPITOR) 20 MG tablet TAKE 1 TABLET BY MOUTH DAILY 30 tablet 5  . budesonide-formoterol (SYMBICORT) 160-4.5  MCG/ACT inhaler Inhale 1-2 puffs into the lings every 12 hours. Gargle and Spit after use. 10.2 Inhaler 3  . Glucosamine-Chondroit-Vit C-Mn (GLUCOSAMINE 1500 COMPLEX PO) Take 1,500 mg by mouth daily.     Marland Kitchen lamoTRIgine (LAMICTAL) 200 MG tablet Take 1.5 tablets ( 300 mg) tablets by mouth in the morning, 1 tablet (200mg ) in the evening 75 tablet 11  . metoprolol (LOPRESSOR) 50 MG tablet Take 1 tablet (50 mg total) by mouth 2 (two) times daily. 180 tablet 1  . Multiple Vitamin (MULTIVITAMIN) tablet Take 1 tablet by mouth every evening.     . pentoxifylline (TRENTAL) 400 MG CR tablet Take 1 tablet (400 mg total) by mouth 3 (three) times daily. 90 tablet 5  . vitamin E 1000 UNIT capsule Take 1 capsule (1,000 Units total) by mouth daily. 30 capsule 12   No current facility-administered medications for this encounter.     Physical Findings:  height is 5\' 4"  (1.626 m) and weight is 138 lb 9.6 oz (62.9 kg). His oral temperature is 98.4 F (36.9 C). His blood pressure is 135/80 and his pulse is 77. His respiration is 18 and oxygen saturation is 96%. .   In general this is a well appearing Caucasian male in no acute distress. He's alert and oriented x4 and appropriate throughout the examination. Cardiopulmonary assessment is negative for acute distress and he exhibits normal effort. Neurologic assessment is stable and again reveals 4/5 proximal strength in the left UE and LE. Grip strength 5/5. No gait disturbances are noted.  Lab Findings: Lab Results  Component Value Date   WBC 8.7 12/12/2016   HGB 15.7 12/12/2016   HCT 46.3 12/12/2016   MCV 85.2 12/12/2016   PLT 232 12/12/2016     Radiographic Findings: Mr Jeri Cos CH Contrast  Result Date: 02/12/2017 CLINICAL DATA:  History of metastatic lung cancer, status post radiation in January of 2017. Follow-up brain metastases. EXAM: MRI HEAD WITHOUT AND WITH CONTRAST TECHNIQUE: Multiplanar, multiecho pulse sequences of the brain and surrounding  structures were  obtained without and with intravenous contrast. CONTRAST:  110mL MULTIHANCE GADOBENATE DIMEGLUMINE 529 MG/ML IV SOLN COMPARISON:  Prior MRI from 10/26/2016. FINDINGS: Brain: Previously identified metastatic lesion positioned at the high right parietal lobe has further collapsed and is slightly decreased in size now measuring 18 x 10 x 20 mm (series 11, image 141, previously 18 x 13 x 21 mm. Surrounding vasogenic edema is slightly improved. Additional cystic parietal metastatic lesion positioned more inferiorly measures 18 x 12 x 17 mm, also slightly decreased (series 11, image 111, previously 20 x 12 x 18 mm. Associated vasogenic edema overall relatively similar. Associated susceptibility artifact about these lesions relatively unchanged. The no new lesions identified. Stable atrophy with chronic microvascular ischemic disease. The no evidence for acute or subacute ischemia. Gray-white matter differentiation maintained. Few scattered punctate chronic micro hemorrhages noted, stable. Tiny remote left cerebellar infarct noted. No other mass lesion or midline shift. No hydrocephalus. No extra-axial fluid collection. Major dural sinuses are grossly patent. Pituitary gland suprasellar region within normal limits. Vascular: Major intracranial vascular flow voids are maintained. Skull and upper cervical spine: Craniocervical junction within normal limits. Bone marrow signal intensity normal. No scalp soft tissue abnormality. Sinuses/Orbits: Globes and orbital soft tissues within normal limits. Paranasal sinuses are clear. No mastoid effusion. Inner ear structures normal. IMPRESSION: 1. Slight interval decrease in size of to right parietal treated metastatic lesions. Associated vasogenic edema is stable to slightly improved. No new lesions. 2. Otherwise stable appearance of the brain with stable mild to moderate chronic small vessel ischemic disease and small remote left cerebellar infarct. Electronically  Signed   By: Jeannine Boga M.D.   On: 02/12/2017 16:49    Impression/Plan: 1. Recurrent Stage IIIA, T2a, N2, M0, NSCLC, adenocarcinoma of the left upper lobe. Mr. Ess appears to be doing well radiographically and clinically. We discussed his MRI results today in the office which demonstrate a slight decrease in size of the treated lesions and NO new lesions.  After reviewing his scans, we discussed the consensus recommendations from conference which are to continue Vitamin E and Trental and follow up in 3 months with repeat MRI brain. He will continue Lamictal under the care of Dr. Delice Lesch.  He remains in observation with Dr. Julien Nordmann as well and will see him back in October 2018. We will see him back in 3 months to again review results from MRI and consensus recommendations from brain conference.The patient will contact us with questions or concerns prior to his next visit.     Nicholos Johns, PA-C

## 2017-02-14 NOTE — Addendum Note (Signed)
Encounter addended by: Malena Edman, RN on: 02/14/2017  4:13 PM<BR>    Actions taken: Charge Capture section accepted

## 2017-02-26 ENCOUNTER — Encounter: Payer: Self-pay | Admitting: Family Medicine

## 2017-02-26 ENCOUNTER — Ambulatory Visit (INDEPENDENT_AMBULATORY_CARE_PROVIDER_SITE_OTHER): Payer: Medicare Other | Admitting: Family Medicine

## 2017-02-26 VITALS — BP 139/83 | HR 77 | Temp 98.3°F | Resp 20 | Wt 137.8 lb

## 2017-02-26 DIAGNOSIS — J40 Bronchitis, not specified as acute or chronic: Secondary | ICD-10-CM | POA: Diagnosis not present

## 2017-02-26 MED ORDER — AZITHROMYCIN 250 MG PO TABS: ORAL_TABLET | ORAL | 0 refills | Status: DC

## 2017-02-26 NOTE — Patient Instructions (Signed)
Mucinex DM take as package states, especially a night dose.  Z-pack prescribed.  Start antihistamine daily. (claritin, allegra or zyrtec)     Acute Bronchitis, Adult Acute bronchitis is when air tubes (bronchi) in the lungs suddenly get swollen. The condition can make it hard to breathe. It can also cause these symptoms:  A cough.  Coughing up clear, yellow, or green mucus.  Wheezing.  Chest congestion.  Shortness of breath.  A fever.  Body aches.  Chills.  A sore throat.  Follow these instructions at home: Medicines  Take over-the-counter and prescription medicines only as told by your doctor.  If you were prescribed an antibiotic medicine, take it as told by your doctor. Do not stop taking the antibiotic even if you start to feel better. General instructions  Rest.  Drink enough fluids to keep your pee (urine) clear or pale yellow.  Avoid smoking and secondhand smoke. If you smoke and you need help quitting, ask your doctor. Quitting will help your lungs heal faster.  Use an inhaler, cool mist vaporizer, or humidifier as told by your doctor.  Keep all follow-up visits as told by your doctor. This is important. How is this prevented? To lower your risk of getting this condition again:  Wash your hands often with soap and water. If you cannot use soap and water, use hand sanitizer.  Avoid contact with people who have cold symptoms.  Try not to touch your hands to your mouth, nose, or eyes.  Make sure to get the flu shot every year.  Contact a doctor if:  Your symptoms do not get better in 2 weeks. Get help right away if:  You cough up blood.  You have chest pain.  You have very bad shortness of breath.  You become dehydrated.  You faint (pass out) or keep feeling like you are going to pass out.  You keep throwing up (vomiting).  You have a very bad headache.  Your fever or chills gets worse. This information is not intended to replace advice  given to you by your health care provider. Make sure you discuss any questions you have with your health care provider. Document Released: 01/24/2008 Document Revised: 03/15/2016 Document Reviewed: 01/26/2016 Elsevier Interactive Patient Education  2017 [employer].

## 2017-02-26 NOTE — Progress Notes (Signed)
Jordan Mccullough , 06-11-1941, 76 y.o., male MRN: [id number] Patient Care Team    Relationship Specialty Notifications Start End  McGowen, Adrian Blackwater, MD PCP - General Family Medicine  09/07/15   Kyung Rudd, MD Consulting Physician Radiation Oncology  09/07/15   Serafina Mitchell, MD Consulting Physician Vascular Surgery  09/07/15   Jovita Gamma, MD Consulting Physician Neurosurgery  09/07/15   Cameron Sprang, MD Consulting Physician Neurology  10/11/15   [name], MD Consulting Physician Oncology  12/20/15     Chief Complaint  Patient presents with  . Cough    for 1 week, productive yellow sputum,headache     Subjective: Pt presents for an OV with complaints of cough of 1 week duration.  Associated symptoms include yellow phlegm production and worsening cough at night that is keeping him up. He reports he was at the beach last week and started to notice symptoms. He has not been around anyone sick. He denies fever, chills, nausea, vomit, diarrhea or shortness of breath. He was a former smoker and has had chemo and radiation for non-small cell ung cancer 2014, as well as Left upper lobectomy. He had experienced brain metastasis 2017. He has taken a z-pack in the past and tolerated medicine. He reports compliance with Symbicort. Reviewed CT chest 11/23/2016- stable lung findings.   Depression screen [facility] 2/9 02/14/2017 10/30/2016 04/03/2016 01/03/2016 10/23/2014  Decreased Interest 0 0 3 0 0  Down, Depressed, Hopeless 0 0 0 0 0  PHQ - 2 Score 0 0 3 0 0  Altered sleeping - - 0 - -  Tired, decreased energy - - 3 - -  Change in appetite - - 0 - -  Feeling bad or failure about yourself  - - 3 - -  Trouble concentrating - - 0 - -  Moving slowly or fidgety/restless - - 0 - -  Suicidal thoughts - - 0 - -  PHQ-9 Score - - 9 - -  Difficult doing work/chores - - Somewhat difficult - -  Some recent data might be hidden    Allergies  Allergen Reactions  . Fluvirin [Influenza Vac Split Quad]  Nausea And Vomiting and Other (See Comments)    Seizure like activity?  Marland Kitchen Rosuvastatin Other (See Comments)    Myalgias and dark urine  . Iodinated Diagnostic Agents Hives    1 hive on lt cheek lasting approximately 1 hour on last 2 CT per pt; needs pre meds in future; 50 mg benadryl po 1 hr prior to exam per Dr. Weber Cooks 07/2014  11/2016 per Tery Sanfilippo, pt needs 13hr premeds per our policy.   Social History  Substance Use Topics  . Smoking status: Current Every Day Smoker    Packs/day: 1.00    Years: 24.00    Types: Cigarettes    Last attempt to quit: 05/28/2013  . Smokeless tobacco: Never Used     Comment: QUIT 15 YEARS AGO  . Alcohol use 9.0 oz/week    3 Glasses of wine, 12 Cans of beer per week     Comment: 1-2 beers a day   Past Medical History:  Diagnosis Date  . AAA (abdominal aortic aneurysm) (HCC)    s/p repair (aortoiliac bipass; with persistent endograft leak in inferior mesenteric artery)--stable as of 01/2017 vascular f/u.  Marland Kitchen Asymptomatic cholelithiasis 07/2015   Incidental finding on PET CT  . Back pain 04/19/2016  . Coronary artery disease    MI 1992, S/P  PTCA; negative  stress test in November 2011 with no ischemia.   . Diverticulosis   . Hearing loss    Bilateral   . History of radiation therapy 11/10/13-12/12/13   lung,50Gy/12fx  . Hyperlipidemia    Crestor= myalgias  . Hypertension   . Lung cancer ([place]) 05/2013   non-small cell;  L upper lobectomy with mediastinal LN dissection, chemo, and radiation in 2014/2015. Remission until pt had questionable seizure 07/2015--MRI showed brain mets; palliative brain rad (stereotactic radiation therapy) started 08/25/15.  CT C/A/P clear 02/2016, 05/2016, and 11/2016.  Plan per onc is to repeat in 6 mo.  . Malignant neoplasm of upper lobe, left bronchus or lung ([place]) 10/29/2013  . Malignant neoplasm of upper lobe, left bronchus or lung ([place]) 06/26/2013  . Myocardial infarction [facility]) 1992   Dr Angelena Form    . Peripheral vascular  disease ([place]) 08/2012   4.8x4.6 cm infrarenal abdominal aortic fusiform aneurysm, 1.5 cm right common iliac artery aneurysm.  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  . S/P radiation therapy [facility] 08/25/15   Stereotactic radiation therapy: frontoparietal 18gy,posterior frontal lobe 20gy,  . Seizure disorder ([place]) 2016/2017   as sequela of brain mets;  Grand mal seizure 07/2015, then got on keppra and was seizure-free until focal motor seizures of L side of face began 02/2016- these responded well to up-titration of keppra but pt had adverse side effects so eventually pt had to be switched over to lamictal 07/2016.  Marland Kitchen Shortness of breath   . Tobacco dependence    "Quit" 1992, but pt has smoked "on and off" since that time   Past Surgical History:  Procedure Laterality Date  . ABDOMINAL AORTIC ENDOVASCULAR STENT GRAFT N/A 05/07/2014   Procedure: ABDOMINAL AORTIC ENDOVASCULAR STENT GRAFT;  Surgeon: Serafina Mitchell, MD;  Location: [place] OR;  Service: Vascular;  Laterality: N/A;  . Carotid duplex dopplers  07/2015   1-39% on R, no signif dz noted on L  . COLONOSCOPY W/ POLYPECTOMY  2003    negative 2010,due 2020; Dr Olevia Perches  . CORONARY ANGIOPLASTY     no stents  . CYSTOSCOPY/RETROGRADE/URETEROSCOPY Bilateral 10/09/2012   Procedure: BILATERAL RETROGRADE bladder and urethral BIOPSY ;  Surgeon: Molli Hazard, MD;  Location: [facility];  Service: Urology;  Laterality: Bilateral;  BILATERAL RETROGRADE   . EEG  08/05/15   Pt placed on keppra just prior to this test due to having ? seizure (MRI showed brain mets)  . HERNIA REPAIR Bilateral    Inguinal  . INGUINAL HERIIORRHAPHY BILATERALLY    . IR GENERIC HISTORICAL  07/20/2016   IR RADIOLOGIST EVAL & MGMT 07/20/2016 [facility] INTERV RAD  . IR GENERIC HISTORICAL  08/02/2016   IR RADIOLOGIST EVAL & MGMT 08/02/2016 Sandi Mariscal, MD [facility] INTERV RAD  .  MEDIASTINOSCOPY N/A 05/01/2013   Procedure: MEDIASTINOSCOPY;  Surgeon: Gaye Pollack, MD;  Location: [facility] OR;  Service: Thoracic;  Laterality: N/A;  . PFTs  04/2013   Minimal obstructive airway disease  . PILONIDAL CYST EXCISION    . PROSTATE BIOPSY N/A 10/09/2012   Procedure: PROSTATIC URETHRAL BIOPSY;  Surgeon: Molli Hazard, MD;  Location: [facility];  Service: Urology;  Laterality: N/A;  PROSTATIC URETHRAL BIOPSY   . PTCA  1992  . ROTATOR CUFF REPAIR     Bilateral  . THOROCOTOMY WITH LOBECTOMY Left 05/29/2013   Procedure: LEFT THOROCOTOMY WITH LEFT UPPER LOBE LOBECTOMY;  Surgeon: Gaye Pollack, MD;  Location: [place];  Service: Thoracic;  Laterality: Left;  Marland Kitchen VIDEO BRONCHOSCOPY N/A 05/01/2013   Procedure: VIDEO BRONCHOSCOPY;  Surgeon: Gaye Pollack, MD;  Location: [facility] OR;  Service: Thoracic;  Laterality: N/A;   Family History  Problem Relation Age of Onset  . Hypertension Mother   . Prostate cancer Father        Prostate cancer  . Cancer Father        Brain Tumor  . Lung cancer Sister        NON SMOKER  . Cancer Sister   . Hyperlipidemia Sister   . Hyperlipidemia Brother   . Heart attack Brother   . Hypertension Brother   . Diabetes Neg Hx   . Stroke Neg Hx    Allergies as of 02/26/2017      Reactions   Fluvirin [influenza Vac Split Quad] Nausea And Vomiting, Other (See Comments)   Seizure like activity?   Rosuvastatin Other (See Comments)   Myalgias and dark urine   Iodinated Diagnostic Agents Hives   1 hive on lt cheek lasting approximately 1 hour on last 2 CT per pt; needs pre meds in future; 50 mg benadryl po 1 hr prior to exam per Dr. Weber Cooks 07/2014 11/2016 per Tery Sanfilippo, pt needs 13hr premeds per our policy.      Medication List       Accurate as of 02/26/17  3:23 PM. Always use your most recent med list.          amLODipine 5 MG tablet Commonly known as:  NORVASC TAKE 1 TABLET (5 MG TOTAL) BY MOUTH DAILY.   aspirin EC 81 MG tablet Take 81 mg by mouth  daily.   atorvastatin 20 MG tablet Commonly known as:  LIPITOR TAKE 1 TABLET BY MOUTH DAILY   budesonide-formoterol 160-4.5 MCG/ACT inhaler Commonly known as:  SYMBICORT Inhale 1-2 puffs into the lings every 12 hours. Gargle and Spit after use.   GLUCOSAMINE 1500 COMPLEX PO Take 1,500 mg by mouth daily.   lamoTRIgine 200 MG tablet Commonly known as:  LAMICTAL Take 1.5 tablets ( 300 mg) tablets by mouth in the morning, 1 tablet (200mg ) in the evening   metoprolol tartrate 50 MG tablet Commonly known as:  LOPRESSOR Take 1 tablet (50 mg total) by mouth 2 (two) times daily.   multivitamin tablet Take 1 tablet by mouth every evening.   pentoxifylline 400 MG CR tablet Commonly known as:  TRENTAL Take 1 tablet (400 mg total) by mouth 3 (three) times daily.   vitamin E 1000 UNIT capsule Take 1 capsule (1,000 Units total) by mouth daily.       All past medical history, surgical history, allergies, family history, immunizations andmedications were updated in the EMR today and reviewed under the history and medication portions of their EMR.     ROS: Negative, with the exception of above mentioned in HPI   Objective:  BP 139/83 (BP Location: Left Arm, Patient Position: Sitting, Cuff Size: Normal)   Pulse 77   Temp 98.3 F (36.8 C)   Resp 20   Wt 137 lb 12.8 oz (62.5 kg)   SpO2 97%   BMI 23.65 kg/m  Body mass index is 23.65 kg/m. Gen: Afebrile. No acute distress. Nontoxic in appearance, well developed, well nourished. Very pleasant male.  HENT: AT. . Bilateral TM visualized without fullness or wrythema. MMM, no oral lesions. Bilateral nares with no erythema, or drianage. Throat without erythema or  exudates. Cough present. No TTP sinus cavity. Eyes:Pupils Equal Round Reactive to light, Extraocular movements intact,  Conjunctiva without redness, discharge or icterus. Neck/lymp/endocrine: Supple,no lymphadenopathy CV: RRR  Chest: CTAB, no wheeze or crackles. Mild-moderate  diminished air movement (lobectomy/radiation), normal resp effort.  Neuro:  Alert. Oriented x3  No exam data present No results found. No results found for this or any previous visit (from the past 24 hour(s)).  Assessment/Plan: FREDI GEILER is a 76 y.o. male present for OV for  Bronchitis - likely increased allergies, phlegm production led to bronchitis after beach trip. - rest, hydrate +/- flonase, start mucinex DM and daily antihistamine of choice. - Z-pack prescribed.  - if not improved in 2 weeks, follow up, would want to perform CXR with his h/o lung cancer and radiation. Overall looks rather well today    Reviewed expectations re: course of current medical issues.  Discussed self-management of symptoms.  Outlined signs and symptoms indicating need for more acute intervention.  Patient verbalized understanding and all questions were answered.  Patient received an After-Visit Summary.    No orders of the defined types were placed in this encounter.    Note is dictated utilizing voice recognition software. Although note has been proof read prior to signing, occasional typographical errors still can be missed. If any questions arise, please do not hesitate to call for verification.   electronically signed by:  Howard Pouch, DO  [place]

## 2017-03-01 ENCOUNTER — Telehealth: Payer: Self-pay | Admitting: Medical Oncology

## 2017-03-01 NOTE — Telephone Encounter (Signed)
Ok

## 2017-03-01 NOTE — Telephone Encounter (Signed)
Jordan Mccullough order Ct chest angio for next scan instead of reg CT.so they can look at his aorta They don't want pt to have 2 scans.

## 2017-03-02 ENCOUNTER — Telehealth: Payer: Self-pay | Admitting: Medical Oncology

## 2017-03-02 ENCOUNTER — Other Ambulatory Visit: Payer: Self-pay | Admitting: Medical Oncology

## 2017-03-02 NOTE — Telephone Encounter (Signed)
I left message that Mohameds ct a/p discontinued and ok for ct angio a/p per [facility]..

## 2017-03-06 ENCOUNTER — Ambulatory Visit: Payer: Medicare Other | Admitting: Neurology

## 2017-03-11 ENCOUNTER — Other Ambulatory Visit: Payer: Self-pay | Admitting: Family Medicine

## 2017-03-19 ENCOUNTER — Other Ambulatory Visit: Payer: Self-pay | Admitting: *Deleted

## 2017-03-19 MED ORDER — ATORVASTATIN CALCIUM 20 MG PO TABS: 20.0000 mg | ORAL_TABLET | Freq: Every day | ORAL | 5 refills | Status: DC

## 2017-03-19 NOTE — Telephone Encounter (Signed)
[facility]  RF request for atorvastatin LOV: 09/27/16 Next ov: None Last written: 10/19/16 #30 w/ 5RF

## 2017-03-28 ENCOUNTER — Other Ambulatory Visit: Payer: Self-pay | Admitting: Family Medicine

## 2017-03-28 NOTE — Telephone Encounter (Signed)
[facility] 

## 2017-03-30 ENCOUNTER — Encounter: Payer: Self-pay | Admitting: Neurology

## 2017-03-30 ENCOUNTER — Ambulatory Visit (INDEPENDENT_AMBULATORY_CARE_PROVIDER_SITE_OTHER): Payer: Medicare Other | Admitting: Neurology

## 2017-03-30 ENCOUNTER — Encounter: Payer: Self-pay | Admitting: Family Medicine

## 2017-03-30 VITALS — BP 132/76 | HR 75 | Ht 65.0 in | Wt 137.0 lb

## 2017-03-30 DIAGNOSIS — G40209 Localization-related (focal) (partial) symptomatic epilepsy and epileptic syndromes with complex partial seizures, not intractable, without status epilepticus: Secondary | ICD-10-CM

## 2017-03-30 DIAGNOSIS — R29898 Other symptoms and signs involving the musculoskeletal system: Secondary | ICD-10-CM

## 2017-03-30 DIAGNOSIS — C7931 Secondary malignant neoplasm of brain: Secondary | ICD-10-CM

## 2017-03-30 NOTE — Progress Notes (Signed)
NEUROLOGY FOLLOW UP OFFICE NOTE  TABOR DENHAM [id number]  HISTORY OF PRESENT ILLNESS: I had the pleasure of seeing Jordan Mccullough in follow-up in the neurology clinic on 03/30/2017. The patient was last seen 3 months ago after he had a new onset convulsive seizure last 08/04/15 and was found to have brain metastases to the right frontal lobe. He was started on Keppra and did well seizure-free for almost 7 months until he had focal motor seizures affecting the left side of his face at the beginning of July 2017. Keppra dose was increased to 1000mg  qhs but he had difficulties with drowsiness and sleepiness even after naps. He was started on Lamotrigine and given a tapering schedule for Keppra. He had two facial seizures during the taper in March and Lamotrigine dose was increased to 200mg  BID. He was completely off the [place] and only on Lamotrigine then reported two facial seizures on 12/21/16. Lamotrigine dose increased further to 300mg  BID. He became too dizzy the next day and reduced back to 300mg  in AM, 200mg  in PM with resolution of dizziness. He had 2 simple partial seizures where he could feel trembling inside his cheek but not visible to witnesses, no associated confusion. He continued on same dose Lamotrigine and presents today for follow-up.  He denies any further simple partial seizures since May 2018. He is doing well on current dose of Lamotrigine 300mg  in AM, 200mg  in PM with no side effects. His main concern today is difficulty with fine finger movements in both hands, R>L. He has baseline mild weakness on the left hand, but feels more problems with his right hand. 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 He denies any focal numbness/tingling, no neck pain. He denies any recent falls. He feels tired easily, needing to take a nap after working 3-4 hours. He has some nasal  congestion and PND causing cough.   I personally reviewed MRI brain with and without contrast done 02/12/17 which did not show any new lesions. There was slight interval decrease in size of the right parietal treated metastatic lesions, associated vasogenic edema stable to slightly improved. Stable mild to moderate chronic microvascular disease.   HPI 10/11/15: This is a pleasant 76 yo RH man with a history of lung cancer s/p lobectomy, chemotherapy and radiation, hyperlipidemia, CAD s/p MI, peripheral vascular disease, abdominal aortic aneurysm, admitted to [facility] last 08/04/15 after a new onset seizure and found to have brain metastases. He states he got his flu shot then went to work, got home and started having a beer the threw up, his eyes got blurry to the point he could not see anything, then has no recollection of events. According to ER notes, after the nausea and vomiting, he decided to drink beer to feel better, his wife left the room then came back to find him with arms extended, shaking, eyes rolled back lasting 5-10 seconds. He recalls coming to without feeling confused, no focal weakness. He was brought to [facility] ER where CT head showed a 1.6 x 1.5 cm hyperdense mass in the right frontal lobe worrisome for focal metastases. MRI was done showing 2 enhancing masses in the right frontal lobe measuring up to 2 x 1.7 cm, no mass effect. He was started on Keppra. He underwent stereotactic radiation.   He called our office in July 2017 to report facial  seizures. He had 5 in one day lasting 5-6 seconds, then stopped. His wife called our office to report 4 facial seizures on 7/19 and one on 7/20, these were lasting 5-6 seconds, he could feel it coming from the left jawline going up to his forehead, with facial twitching seen. No associated headache or confusion. He has a constant feeling of numbness on the left side of his face, "like you had Novocaine." He continues to have left arm and leg weakness that has  been chronic, no twitching involved. He feels he has a hard time controlling them. He denies any falls. Keppra dose was increased to 1000mg  qhs.  He denies any prior history of seizures. His wife denies any staring/unresponsive episodes, no gaps in time, olfactory/gustatory hallucinations, deja vu, rising epigastric sensation, focal numbness/tingling/weakness, myoclonic jerks, headaches, dysarthria/dysphagia, bowel/bladder dysfunction. He was discharged home on Decadron and Keppra, and reports that he was "just totally out of it," functioning only 50%. He felt significantly better getting of Decadron, but continued to have generalized weakness, particularly in the legs, blurred vision, and reported this to his PCP Dr. Anitra Lauth. Keppra dose was reduced to 250mg  BID 5 days ago, and he reports that symptoms are better, but still there. He states that he wakes up feeling fine, but then starts feeling the symptoms after taking morning Keppra dose. He has mild 2-3 over 10 back pain when walking, relieved when he sits down. He continues to drink beer daily but has reduced to 1-2 a day from 3-5 beers a day. His wife reports he would drink 3 beer cases in a week.  Epilepsy Risk Factors: Brain metastases in the right frontal lobe. Otherwise he had a normal birth and early development. There is no history of febrile convulsions, CNS infections such as meningitis/encephalitis, significant traumatic brain injury, neurosurgical procedures, or family history of seizures.   I personally reviewed MRI brain with and without contrast done 08/12/15 which showed an 8x5mm centrally necrotic metastasis in the right posterior frontal lobe; there is a centrally necrotic mass measuring 32mm at the right vertex frontoparietal junction with regional vasogenic edema. Routine EEG 08/05/15 was a normal wake and sleep study  PAST MEDICAL HISTORY: Past Medical History:  Diagnosis Date  . AAA (abdominal aortic aneurysm) (HCC)    s/p  repair (aortoiliac bipass; with persistent endograft leak in inferior mesenteric artery)--stable as of 01/2017 vascular f/u.  Marland Kitchen Asymptomatic cholelithiasis 07/2015   Incidental finding on PET CT  . Back pain 04/19/2016  . Coronary artery disease    MI 1992, S/P  PTCA; negative stress test in November 2011 with no ischemia.   . Diverticulosis   . Hearing loss    Bilateral   . History of radiation therapy 11/10/13-12/12/13   lung,50Gy/29fx  . Hyperlipidemia    Crestor= myalgias  . Hypertension   . Lung cancer ([place]) 05/2013   non-small cell;  L upper lobectomy with mediastinal LN dissection, chemo, and radiation in 2014/2015. Remission until pt had questionable seizure 07/2015--MRI showed brain mets; palliative brain rad (stereotactic radiation therapy) started 08/25/15.  CT C/A/P clear 02/2016, 05/2016, and 11/2016.  Plan per onc is to repeat in 6 mo.  . Malignant neoplasm of upper lobe, left bronchus or lung ([place]) 10/29/2013  . Malignant neoplasm of upper lobe, left bronchus or lung ([place]) 06/26/2013  . Myocardial infarction [facility]) 1992   Dr Angelena Form    . Peripheral vascular disease ([place]) 08/2012   4.8x4.6 cm infrarenal abdominal aortic fusiform aneurysm, 1.5  cm right common iliac artery aneurysm.  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  . S/P radiation therapy [facility] 08/25/15   Stereotactic radiation therapy: frontoparietal 18gy,posterior frontal lobe 20gy,  . Seizure disorder ([place]) 2016/2017   as sequela of brain mets;  Grand mal seizure 07/2015, then got on keppra and was seizure-free until focal motor seizures of L side of face began 02/2016- these responded well to up-titration of keppra but pt had adverse side effects so eventually pt had to be switched over to lamictal 07/2016.  Marland Kitchen Shortness of breath   . Tobacco dependence    "Quit" 1992, but pt has smoked "on and off" since that time     MEDICATIONS:  Outpatient Encounter Prescriptions as of 03/30/2017  Medication Sig  . amLODipine (NORVASC) 5 MG tablet TAKE 1 TABLET (5 MG TOTAL) BY MOUTH DAILY.  Marland Kitchen aspirin EC 81 MG tablet Take 81 mg by mouth daily.  Marland Kitchen atorvastatin (LIPITOR) 20 MG tablet Take 1 tablet (20 mg total) by mouth daily.  Marland Kitchen azithromycin (ZITHROMAX) 250 MG tablet 500 mg day 1, then 250 QD  . budesonide-formoterol (SYMBICORT) 160-4.5 MCG/ACT inhaler Inhale 1-2 puffs into the lings every 12 hours. Gargle and Spit after use.  . Glucosamine-Chondroit-Vit C-Mn (GLUCOSAMINE 1500 COMPLEX PO) Take 1,500 mg by mouth daily.   Marland Kitchen lamoTRIgine (LAMICTAL) 200 MG tablet Take 1.5 tablets ( 300 mg) tablets by mouth in the morning, 1 tablet (200mg ) in the evening  . metoprolol tartrate (LOPRESSOR) 50 MG tablet TAKE 1 TABLET BY MOUTH TWICE A DAY  . Multiple Vitamin (MULTIVITAMIN) tablet Take 1 tablet by mouth every evening.   . pentoxifylline (TRENTAL) 400 MG CR tablet Take 1 tablet (400 mg total) by mouth 3 (three) times daily.  . vitamin E 1000 UNIT capsule Take 1 capsule (1,000 Units total) by mouth daily.   No facility-administered encounter medications on file as of 03/30/2017.     ALLERGIES: Allergies  Allergen Reactions  . Fluvirin [Influenza Vac Split Quad] Nausea And Vomiting and Other (See Comments)    Seizure like activity?  Marland Kitchen Rosuvastatin Other (See Comments)    Myalgias and dark urine  . Iodinated Diagnostic Agents Hives    1 hive on lt cheek lasting approximately 1 hour on last 2 CT per pt; needs pre meds in future; 50 mg benadryl po 1 hr prior to exam per Dr. Weber Cooks 07/2014  11/2016 per Tery Sanfilippo, pt needs 13hr premeds per our policy.    FAMILY HISTORY: Family History  Problem Relation Age of Onset  . Hypertension Mother   . Prostate cancer Father        Prostate cancer  . Cancer Father        Brain Tumor  . Lung cancer Sister        NON SMOKER  . Cancer Sister   . Hyperlipidemia Sister   .  Hyperlipidemia Brother   . Heart attack Brother   . Hypertension Brother   . Diabetes Neg Hx   . Stroke Neg Hx     SOCIAL HISTORY: Social History   Social History  . Marital status: Married    Spouse name: N/A  . Number of children: N/A  . Years of education: N/A   Occupational History  . Retired Risk analyst    Social History Main Topics  . Smoking status: Current Every Day Smoker    Packs/day: 1.00  Years: 24.00    Types: Cigarettes    Last attempt to quit: 05/28/2013  . Smokeless tobacco: Never Used     Comment: QUIT 15 YEARS AGO  . Alcohol use 9.0 oz/week    3 Glasses of wine, 12 Cans of beer per week     Comment: 1-2 beers a day  . Drug use: No  . Sexual activity: Not on file   Other Topics Concern  . Not on file   Social History Narrative   HEART HEALTHY DIET.     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   MARRIED, 2 children who live in [place].  Smoked on and off since then.   ALCOHOL USE -YES- RED WINE SOCIALLY, couple of beers at night usually.    REVIEW OF SYSTEMS: Constitutional: No fevers, chills, or sweats, generalized fatigue, change in appetite Eyes: No visual changes, double vision, eye pain Ear, nose and throat: No hearing loss, ear pain, nasal congestion, sore throat Cardiovascular: No chest pain, palpitations Respiratory:  No shortness of breath at rest or with exertion, wheezes GastrointestinaI: No nausea, vomiting, diarrhea, abdominal pain, fecal incontinence Genitourinary:  No dysuria, urinary retention or frequency Musculoskeletal:  No neck pain, +back pain Integumentary: No rash, pruritus, skin lesions Neurological: as above Psychiatric: No depression,+ insomnia, no anxiety Endocrine: No palpitations, fatigue, diaphoresis, mood swings, change in appetite, change in weight, increased thirst Hematologic/Lymphatic:  No anemia, purpura, petechiae. Allergic/Immunologic: no itchy/runny eyes, nasal congestion, recent allergic  reactions, rashes  PHYSICAL EXAM: Vitals:   03/30/17 1025  BP: 132/76  Pulse: 75  SpO2: 96%   General: No acute distress Head:  Normocephalic/atraumatic Neck: supple, no paraspinal tenderness, full range of motion Heart:  Regular rate and rhythm Lungs:  Clear to auscultation bilaterally Back: No paraspinal tenderness Skin/Extremities: No rash, no edema. No pain to palpation of right shoulder Neurological Exam: alert and oriented to person, place, and time. No aphasia or dysarthria. Fund of knowledge is appropriate.  Recent and remote memory are intact.  Attention and concentration are normal.    Able to name objects and repeat phrases. Cranial nerves: Pupils equal, round, reactive to light. Extraocular movements intact with no nystagmus. Visual fields full. Facial sensation intact. No facial asymmetry. Tongue, uvula, palate midline.  Motor: Bulk and tone normal, muscle strength 5/5 throughout except for 4/5 bilateral APB, no pronator drift. Good finger taps. Sensation to light touch and pin intact.  No extinction to double simultaneous stimulation.  Deep tendon reflexes brisk +2 throughout, toes downgoing.  Finger to nose testing intact.  Gait narrow-based and steady without cane (uses cane for balance), no ataxia. Negative Tinel sign at the wrist and elbow.  IMPRESSION: This is a pleasant 76 yo RH man with a history of lung cancer s/p lobectomy, radiation, chemotherapy, found to have 2 right frontal brain metastases after he had a convulsive seizure on 08/04/15. Routine EEG normal. He started having focal motor seizures affecting the face in July 2017 and difficulty tolerating higher doses of Keppra. He has been switched to Lamotrigine, currently on 300mg  in AM, 200mg  in PM (did not tolerate higher dose) with no further simple partial seizures since May 2018. His main concern today is difficulty with controlling his hands with fine movements. Exam shows slight weakness of bilateral APB, EMG/NCV  of both UE will be ordered to assess for carpal tunnel syndrome. He had an MRI brain in June with no acute changes, stable right parietal metastatic lesions. We again  discussed poor sleep, he will try melatonin higher dose of melatonin. He is aware of [place] driving laws to stop driving after a seizure, until 6 months seizure-free. He will follow-up in 4-5 months and knows to call for any problems.  Thank you for allowing me to participate in his care.  Please do not hesitate to call for any questions or concerns.  The duration of this appointment visit was 25 minutes of face-to-face time with the patient.  Greater than 50% of this time was spent in counseling, explanation of diagnosis, planning of further management, and coordination of care.   Ellouise Newer, M.D.   CC: Dr. Anitra Lauth, Dr. Sherwood Gambler, Dr. Julien Nordmann

## 2017-03-30 NOTE — Patient Instructions (Addendum)
1. Schedule EMG/NCV of both UE 2. Continue Lamotrigine 200mg : Take 1.5 tabs in AM, 1 tab in PM 3. Proceed with MRI brain as scheduled 4. Follow-up in 4 months, call for any changes  Seizure Precautions: 1. If medication has been prescribed for you to prevent seizures, take it exactly as directed.  Do not stop taking the medicine without talking to your doctor first, even if you have not had a seizure in a long time.   2. Avoid activities in which a seizure would cause danger to yourself or to others.  Don't operate dangerous machinery, swim alone, or climb in high or dangerous places, such as on ladders, roofs, or girders.  Do not drive unless your doctor says you may.  3. If you have any warning that you may have a seizure, lay down in a safe place where you can't hurt yourself.    4.  No driving for 6 months from last seizure, as per [facility].   Please refer to the following link on the [place] website for more information: http://www.epilepsyfoundation.org/answerplace/Social/driving/drivingu.cfm   5.  Maintain good sleep hygiene. Avoid alcohol.  6.  Contact your doctor if you have any problems that may be related to the medicine you are taking.  7.  Call 911 and bring the patient back to the ED if:        A.  The seizure lasts longer than 5 minutes.       B.  The patient doesn't awaken shortly after the seizure  C.  The patient has new problems such as difficulty seeing, speaking or moving  D.  The patient was injured during the seizure  E.  The patient has a temperature over 102 F (39C)  F.  The patient vomited and now is having trouble breathing

## 2017-04-02 MED ORDER — BUDESONIDE-FORMOTEROL FUMARATE 160-4.5 MCG/ACT IN AERO: INHALATION_SPRAY | RESPIRATORY_TRACT | 3 refills | Status: DC

## 2017-04-05 ENCOUNTER — Other Ambulatory Visit: Payer: Self-pay | Admitting: Radiation Therapy

## 2017-04-05 DIAGNOSIS — C7931 Secondary malignant neoplasm of brain: Secondary | ICD-10-CM

## 2017-04-12 ENCOUNTER — Encounter: Payer: Self-pay | Admitting: Neurology

## 2017-04-26 ENCOUNTER — Encounter: Payer: Medicare Other | Admitting: Neurology

## 2017-05-15 ENCOUNTER — Ambulatory Visit
Admission: RE | Admit: 2017-05-15 | Discharge: 2017-05-15 | Disposition: A | Discharge status: Home / Self Care | Payer: Medicare Other | Source: Ambulatory Visit | Attending: Radiation Oncology | Admitting: Radiation Oncology

## 2017-05-15 ENCOUNTER — Other Ambulatory Visit: Payer: Medicare Other

## 2017-05-15 DIAGNOSIS — C799 Secondary malignant neoplasm of unspecified site: Secondary | ICD-10-CM | POA: Diagnosis not present

## 2017-05-15 DIAGNOSIS — C7931 Secondary malignant neoplasm of brain: Secondary | ICD-10-CM

## 2017-05-15 MED ORDER — GADOBENATE DIMEGLUMINE 529 MG/ML IV SOLN
13.0000 mL | Freq: Once | INTRAVENOUS | Status: AC | PRN
  Administered 2017-05-15: 13 mL via INTRAVENOUS

## 2017-05-18 DIAGNOSIS — I10 Essential (primary) hypertension: Secondary | ICD-10-CM | POA: Diagnosis not present

## 2017-05-18 DIAGNOSIS — C7931 Secondary malignant neoplasm of brain: Secondary | ICD-10-CM | POA: Diagnosis not present

## 2017-05-21 ENCOUNTER — Other Ambulatory Visit: Payer: Self-pay | Admitting: *Deleted

## 2017-05-21 MED ORDER — DIPHENHYDRAMINE HCL 50 MG PO TABS: 50.0000 mg | ORAL_TABLET | Freq: Once | ORAL | 0 refills | Status: DC

## 2017-05-21 MED ORDER — PREDNISONE 50 MG PO TABS: ORAL_TABLET | ORAL | 0 refills | Status: DC

## 2017-05-21 NOTE — Telephone Encounter (Signed)
Called pt about upcoming CT scan on 10/29 Pt advised he usually takes benadryl and prednisone prior. Rx sent to pt pharmacy.

## 2017-05-23 ENCOUNTER — Other Ambulatory Visit: Payer: Self-pay | Admitting: Family Medicine

## 2017-05-23 NOTE — Telephone Encounter (Signed)
Pt was due for f/u [facility] in August 2018. Will send Rx for #90 w/ 0RF. Needs office visit for f/u [facility] for more refills.

## 2017-05-23 NOTE — Telephone Encounter (Signed)
Tried calling pt, number busy.

## 2017-05-25 ENCOUNTER — Other Ambulatory Visit: Payer: Self-pay | Admitting: Neurology

## 2017-05-29 NOTE — Telephone Encounter (Signed)
Pts wife advised and voiced understanding, she stated that she will have pt call back to schedule an apt.

## 2017-05-29 NOTE — Telephone Encounter (Signed)
Left message for pt to call back  °

## 2017-06-01 ENCOUNTER — Ambulatory Visit (INDEPENDENT_AMBULATORY_CARE_PROVIDER_SITE_OTHER): Payer: Medicare Other

## 2017-06-01 DIAGNOSIS — Z23 Encounter for immunization: Secondary | ICD-10-CM

## 2017-06-04 ENCOUNTER — Telehealth: Payer: Self-pay | Admitting: Internal Medicine

## 2017-06-04 NOTE — Telephone Encounter (Signed)
Lvm advising 10/31 appt moved to 11/13 @ 3.30pm due to mdadmin time on 10/31. Advised in vm lab and ct appt on 10/29 remain unchanged.

## 2017-06-08 ENCOUNTER — Other Ambulatory Visit (HOSPITAL_COMMUNITY): Payer: Self-pay | Admitting: Interventional Radiology

## 2017-06-08 DIAGNOSIS — IMO0001 Reserved for inherently not codable concepts without codable children: Secondary | ICD-10-CM

## 2017-06-08 DIAGNOSIS — T82330D Leakage of aortic (bifurcation) graft (replacement), subsequent encounter: Secondary | ICD-10-CM

## 2017-06-18 ENCOUNTER — Encounter (HOSPITAL_COMMUNITY): Payer: Self-pay

## 2017-06-18 ENCOUNTER — Ambulatory Visit (HOSPITAL_COMMUNITY)
Admission: RE | Admit: 2017-06-18 | Discharge: 2017-06-18 | Disposition: A | Discharge status: Home / Self Care | Payer: Medicare Other | Source: Ambulatory Visit | Attending: Internal Medicine | Admitting: Internal Medicine

## 2017-06-18 ENCOUNTER — Other Ambulatory Visit (HOSPITAL_BASED_OUTPATIENT_CLINIC_OR_DEPARTMENT_OTHER): Payer: Medicare Other

## 2017-06-18 DIAGNOSIS — C7931 Secondary malignant neoplasm of brain: Secondary | ICD-10-CM | POA: Insufficient documentation

## 2017-06-18 DIAGNOSIS — I700 Atherosclerosis of aorta: Secondary | ICD-10-CM | POA: Insufficient documentation

## 2017-06-18 DIAGNOSIS — J439 Emphysema, unspecified: Secondary | ICD-10-CM | POA: Diagnosis not present

## 2017-06-18 DIAGNOSIS — C3412 Malignant neoplasm of upper lobe, left bronchus or lung: Secondary | ICD-10-CM

## 2017-06-18 DIAGNOSIS — I2510 Atherosclerotic heart disease of native coronary artery without angina pectoris: Secondary | ICD-10-CM | POA: Insufficient documentation

## 2017-06-18 DIAGNOSIS — Z902 Acquired absence of lung [part of]: Secondary | ICD-10-CM | POA: Insufficient documentation

## 2017-06-18 DIAGNOSIS — C3490 Malignant neoplasm of unspecified part of unspecified bronchus or lung: Secondary | ICD-10-CM | POA: Diagnosis not present

## 2017-06-18 LAB — COMPREHENSIVE METABOLIC PANEL
ALT: 17 U/L (ref 0–55)
AST: 15 U/L (ref 5–34)
Albumin: 4 g/dL (ref 3.5–5.0)
Alkaline Phosphatase: 63 U/L (ref 40–150)
Anion Gap: 10 mEq/L (ref 3–11)
BUN: 14.3 mg/dL (ref 7.0–26.0)
CO2: 25 mEq/L (ref 22–29)
Calcium: 10.3 mg/dL (ref 8.4–10.4)
Chloride: 106 mEq/L (ref 98–109)
Creatinine: 0.9 mg/dL (ref 0.7–1.3)
EGFR: >60 mL/min/{1.73_m2} (ref >60)
Glucose: 146 mg/dl — ABNORMAL HIGH (ref 70–140)
Potassium: 5.3 mEq/L — ABNORMAL HIGH (ref 3.5–5.1)
Sodium: 141 mEq/L (ref 136–145)
Total Bilirubin: 0.35 mg/dL (ref 0.20–1.20)
Total Protein: 7.6 g/dL (ref 6.4–8.3)

## 2017-06-18 LAB — CBC WITH DIFFERENTIAL/PLATELET
BASO%: 0.5 % (ref 0.0–2.0)
Basophils Absolute: 0 10*3/uL (ref 0.0–0.1)
EOS%: 0 % (ref 0.0–7.0)
Eosinophils Absolute: 0 10*3/uL (ref 0.0–0.5)
HCT: 46.3 % (ref 38.4–49.9)
HGB: 15.7 g/dL (ref 13.0–17.1)
LYMPH%: 10 % — ABNORMAL LOW (ref 14.0–49.0)
MCH: 29.5 pg (ref 27.2–33.4)
MCHC: 34 g/dL (ref 32.0–36.0)
MCV: 86.7 fL (ref 79.3–98.0)
MONO#: 0 10*3/uL — ABNORMAL LOW (ref 0.1–0.9)
MONO%: 0.5 % (ref 0.0–14.0)
NEUT#: 6.3 10*3/uL (ref 1.5–6.5)
NEUT%: 89 % — ABNORMAL HIGH (ref 39.0–75.0)
Platelets: 243 10*3/uL (ref 140–400)
RBC: 5.34 10*6/uL (ref 4.20–5.82)
RDW: 15.5 % — ABNORMAL HIGH (ref 11.0–14.6)
WBC: 7.1 10*3/uL (ref 4.0–10.3)
lymph#: 0.7 10*3/uL — ABNORMAL LOW (ref 0.9–3.3)

## 2017-06-18 MED ORDER — IOPAMIDOL (ISOVUE-300) INJECTION 61%
INTRAVENOUS | Status: AC
  Administered 2017-06-18: 75 mL via INTRAVENOUS
  Filled 2017-06-18: qty 75

## 2017-06-19 ENCOUNTER — Ambulatory Visit: Payer: Medicare Other | Admitting: Internal Medicine

## 2017-06-20 ENCOUNTER — Ambulatory Visit: Payer: Medicare Other | Admitting: Internal Medicine

## 2017-06-21 ENCOUNTER — Encounter: Payer: Self-pay | Admitting: Surgery

## 2017-06-25 ENCOUNTER — Ambulatory Visit: Payer: Medicare Other | Admitting: Surgery

## 2017-07-02 ENCOUNTER — Encounter: Payer: Self-pay | Admitting: Family Medicine

## 2017-07-03 ENCOUNTER — Ambulatory Visit (HOSPITAL_BASED_OUTPATIENT_CLINIC_OR_DEPARTMENT_OTHER): Payer: Medicare Other | Admitting: Internal Medicine

## 2017-07-03 ENCOUNTER — Encounter: Payer: Self-pay | Admitting: *Deleted

## 2017-07-03 ENCOUNTER — Encounter: Payer: Self-pay | Admitting: Internal Medicine

## 2017-07-03 ENCOUNTER — Telehealth: Payer: Self-pay | Admitting: Internal Medicine

## 2017-07-03 DIAGNOSIS — C3412 Malignant neoplasm of upper lobe, left bronchus or lung: Secondary | ICD-10-CM

## 2017-07-03 DIAGNOSIS — C3490 Malignant neoplasm of unspecified part of unspecified bronchus or lung: Secondary | ICD-10-CM

## 2017-07-03 DIAGNOSIS — C7931 Secondary malignant neoplasm of brain: Secondary | ICD-10-CM | POA: Diagnosis not present

## 2017-07-03 NOTE — Patient Instructions (Signed)
Steps to Quit Smoking Smoking tobacco can be bad for your health. It can also affect almost every organ in your body. Smoking puts you and people around you at risk for many serious long-lasting (chronic) diseases. Quitting smoking is hard, but it is one of the best things that you can do for your health. It is never too late to quit. What are the benefits of quitting smoking? When you quit smoking, you lower your risk for getting serious diseases and conditions. They can include:  Lung cancer or lung disease.  Heart disease.  Stroke.  Heart attack.  Not being able to have children (infertility).  Weak bones (osteoporosis) and broken bones (fractures).  If you have coughing, wheezing, and shortness of breath, those symptoms may get better when you quit. You may also get sick less often. If you are pregnant, quitting smoking can help to lower your chances of having a baby of low birth weight. What can I do to help me quit smoking? Talk with your doctor about what can help you quit smoking. Some things you can do (strategies) include:  Quitting smoking totally, instead of slowly cutting back how much you smoke over a period of time.  Going to in-person counseling. You are more likely to quit if you go to many counseling sessions.  Using resources and support systems, such as: ? Online chats with a counselor. ? Phone quitlines. ? Printed self-help materials. ? Support groups or group counseling. ? Text messaging programs. ? Mobile phone apps or applications.  Taking medicines. Some of these medicines may have nicotine in them. If you are pregnant or breastfeeding, do not take any medicines to quit smoking unless your doctor says it is okay. Talk with your doctor about counseling or other things that can help you.  Talk with your doctor about using more than one strategy at the same time, such as taking medicines while you are also going to in-person counseling. This can help make  quitting easier. What things can I do to make it easier to quit? Quitting smoking might feel very hard at first, but there is a lot that you can do to make it easier. Take these steps:  Talk to your family and friends. Ask them to support and encourage you.  Call phone quitlines, reach out to support groups, or work with a counselor.  Ask people who smoke to not smoke around you.  Avoid places that make you want (trigger) to smoke, such as: ? Bars. ? Parties. ? Smoke-break areas at work.  Spend time with people who do not smoke.  Lower the stress in your life. Stress can make you want to smoke. Try these things to help your stress: ? Getting regular exercise. ? Deep-breathing exercises. ? Yoga. ? Meditating. ? Doing a body scan. To do this, close your eyes, focus on one area of your body at a time from head to toe, and notice which parts of your body are tense. Try to relax the muscles in those areas.  Download or buy apps on your mobile phone or tablet that can help you stick to your quit plan. There are many free apps, such as QuitGuide from the CDC (Centers for Disease Control and Prevention). You can find more support from smokefree.gov and other websites.  This information is not intended to replace advice given to you by your health care provider. Make sure you discuss any questions you have with your health care provider. Document Released: 06/03/2009 Document   Revised: 04/04/2016 Document Reviewed: 12/22/2014 Elsevier Interactive Patient Education  2018 Elsevier Inc.  

## 2017-07-03 NOTE — Telephone Encounter (Signed)
Gave avs and calendar for November 2019

## 2017-07-03 NOTE — Progress Notes (Signed)
Jordan Mccullough  [phone] [phone] OFFICE PROGRESS NOTE  Tammi Sou, MD [address] [place] [address]  DIAGNOSIS: Metastatic non-small cell lung cancer initially diagnosed as Stage IIIA (T2a., N2, M0) non-small cell lung cancer adenocarcinoma with negative EGFR mutation and negative ALK gene translocation involving the left upper lobe middle mediastinal lymphadenopathy diagnosed in August of 2014. He had metastatic disease to the brain diagnosed in December 2016.  Lung cancer   Primary site: Lung (Left)   Staging method: AJCC 7th Edition   Clinical: Stage IIIA (T2a, N2, M0) signed by [name], MD on 06/19/2013  5:06 PM   Summary: Stage IIIA (T2a, N2, M0).  Molecular biomarkers: Positive for [id number]*7, [id number] ATM splice site [id number]>OF TP53 E271K, SETD2 [id number] and SPOP E47K                                          Negative for: RET, ALK, BRAF, KRAS, ERBB2, MET and EGFR.  PRIOR THERAPY:  1) Status post left upper lobectomy with mediastinal lymph node dissection on 05/29/2013. 2) Adjuvant chemotherapy with cisplatin at 75 mg per meter squared and Alimta at 500 mg per meter squared given every 3 weeks for a total of 4 cycles. First cycle given on 07/10/2013 3) curative radiotherapy to the mediastinum under the care of Dr. [name] on 12/12/2013. 4) status post stereotactic radiotherapy to the metastatic brain lesion on 08/25/2015 under the care of Dr. [name] CURRENT THERAPY: Observation.  INTERVAL HISTORY: Jordan BOZZI89.o. male returns to the clinic today for follow-up visit accompanied by his wife.  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  He denied having any chest pain, shortness of breath, cough or hemoptysis.  He denied having any fever or chills.  He has no nausea, vomiting, diarrhea or  constipation.  He has been on observation for several years.  He had repeat CT scan of the chest performed recently and he is here for evaluation and discussion of his discuss results.  MEDICAL HISTORY: Past Medical History:  Diagnosis Date  . AAA (abdominal aortic aneurysm) (HCC)    s/p repair (aortoiliac bipass; with persistent endograft leak in inferior mesenteric artery)--stable as of 01/2017 vascular f/u.  . Marland Kitchensymptomatic cholelithiasis 07/2015   Incidental finding on PET CT  . Back pain 04/19/2016  . Coronary artery disease    MI 1992, S/P  PTCA; negative stress test in November 2011 with no ischemia.   . Diverticulosis   . Hearing loss    Bilateral   . History of radiation therapy 11/10/13-12/12/13   lung,50Gy/2552f. Hyperlipidemia    Crestor= myalgias  . Hypertension   . [place]   non-small cell;  L upper lobectomy with mediastinal LN dissection, chemo, and radiation in 2014/2015. Remission until pt had questionable seizure 07/2015--MRI showed brain mets; palliative brain rad (stereotactic radiation therapy) started 08/25/15.  CT C/A/P clear 02/2016, 05/2016, and 11/2016.  Plan per onc is to repeat in 6 mo.  . Malignant ne[place] of upper lobe, left bronchus or lung ([place]  . Malignant ne[place] of [place] lobe, left bronchus or lung ([facility]01/2013  . [facility]   Dr McAAngelena Form . Peripheral  vascular disease ([place]) 08/2012   4.8x4.6 cm infrarenal abdominal aortic fusiform aneurysm, 1.5 cm right common iliac artery aneurysm.  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  . S/P radiation therapy [facility] 08/25/15   Stereotactic radiation therapy: frontoparietal 18gy,posterior frontal lobe 20gy,  . Seizure disorder ([place]) 2016/2017   as sequela of brain mets;  Grand mal seizure 07/2015, then got on keppra and was seizure-free until focal motor seizures of L  side of face began 02/2016- these responded well to up-titration of keppra but pt had adverse side effects so eventually pt had to be switched over to lamictal 07/2016.  Marland Kitchen Shortness of breath   . Tobacco dependence    "Quit" 1992, but pt has smoked "on and off" since that time    ALLERGIES:  is allergic to fluvirin [influenza vac split quad]; rosuvastatin; and iodinated diagnostic agents.  MEDICATIONS:  Current Outpatient Medications  Medication Sig Dispense Refill  . amLODipine (NORVASC) 5 MG tablet TAKE 1 TABLET (5 MG TOTAL) BY MOUTH DAILY. 90 tablet 3  . amLODipine (NORVASC) 5 MG tablet TAKE 1 TABLET BY MOUTH EVERY DAY 90 tablet 0  . aspirin EC 81 MG tablet Take 81 mg by mouth daily.    Marland Kitchen atorvastatin (LIPITOR) 20 MG tablet Take 1 tablet (20 mg total) by mouth daily. 30 tablet 5  . azithromycin (ZITHROMAX) 250 MG tablet 500 mg day 1, then 250 QD 6 each 0  . budesonide-formoterol (SYMBICORT) 160-4.5 MCG/ACT inhaler Inhale 1-2 puffs into the lings every 12 hours. Gargle and Spit after use. 10.2 Inhaler 3  . diphenhydrAMINE (BENADRYL) 50 MG tablet Take 1 tablet (50 mg total) by mouth once. 1 tablet 0  . Glucosamine-Chondroit-Vit C-Mn (GLUCOSAMINE 1500 COMPLEX PO) Take 1,500 mg by mouth daily.     Marland Kitchen lamoTRIgine (LAMICTAL) 200 MG tablet Take 1.5 tablets ( 300 mg) tablets by mouth in the morning, 1 tablet (211m) in the evening 75 tablet 11  . lamoTRIgine (LAMICTAL) 200 MG tablet TAKE 1 TABLET BY MOUTH IN THE MORNING. TAKE 1.5 TABLETS BY MOUTH IN THE EVENING 72 tablet 5  . metoprolol tartrate (LOPRESSOR) 50 MG tablet TAKE 1 TABLET BY MOUTH TWICE A DAY 180 tablet 1  . Multiple Vitamin (MULTIVITAMIN) tablet Take 1 tablet by mouth every evening.     . pentoxifylline (TRENTAL) 400 MG CR tablet Take 1 tablet (400 mg total) by mouth 3 (three) times daily. 90 tablet 5  . predniSONE (DELTASONE) 50 MG tablet Take I tablet 13 hrs prior to CT scan, Take 1 tablet 7 hours prior to CT Scan, Take 1 tablet 1 hr  before CT scan. 3 tablet 0  . vitamin E 1000 UNIT capsule Take 1 capsule (1,000 Units total) by mouth daily. 30 capsule 12   No current facility-administered medications for this visit.     SURGICAL HISTORY:  Past Surgical History:  Procedure Laterality Date  . Carotid duplex dopplers  07/2015   1-39% on R, no signif dz noted on L  . COLONOSCOPY W/ POLYPECTOMY  2003    negative 2010,due 2020; Dr BOlevia Perches . CORONARY ANGIOPLASTY     no stents  . EEG  08/05/15   Pt placed on keppra just prior to this test due to having ? seizure (MRI showed brain mets)  . HERNIA REPAIR Bilateral    Inguinal  . INGUINAL HERIIORRHAPHY BILATERALLY    . IR  GENERIC HISTORICAL  07/20/2016   IR RADIOLOGIST EVAL & MGMT 07/20/2016 [facility] INTERV RAD  . IR GENERIC HISTORICAL  08/02/2016   IR RADIOLOGIST EVAL & MGMT 08/02/2016 Sandi Mariscal, MD [facility] INTERV RAD  . PFTs  04/2013   Minimal obstructive airway disease  . PILONIDAL CYST EXCISION    . PTCA  1992  . ROTATOR CUFF REPAIR     Bilateral    REVIEW OF SYSTEMS:  A comprehensive review of systems was negative except for: Musculoskeletal: positive for arthralgias   PHYSICAL EXAMINATION: General appearance: alert, cooperative and no distress Head: Normocephalic, without obvious abnormality, atraumatic Neck: no adenopathy, no JVD, supple, symmetrical, trachea midline and thyroid not enlarged, symmetric, no tenderness/mass/nodules Lymph nodes: Cervical, supraclavicular, and axillary nodes normal. Resp: clear to auscultation bilaterally Back: symmetric, no curvature. ROM normal. No CVA tenderness. Cardio: regular rate and rhythm, S1, S2 normal, no murmur, click, rub or gallop GI: soft, non-tender; bowel sounds normal; no masses,  no organomegaly Extremities: extremities normal, atraumatic, no cyanosis or edema  ECOG PERFORMANCE STATUS: 1 - Symptomatic but completely ambulatory  Blood pressure (!) 155/89, pulse 77, temperature (!) 97.3 F (36.3 C),  temperature source Oral, resp. rate 18, height 5' 5"  (1.651 m), weight 141 lb 14.4 oz (64.4 kg), SpO2 100 %.  LABORATORY DATA: Lab Results  Component Value Date   WBC 7.1 06/18/2017   HGB 15.7 06/18/2017   HCT 46.3 06/18/2017   MCV 86.7 06/18/2017   PLT 243 06/18/2017      Chemistry      Component Value Date/Time   NA 141 06/18/2017 1006   K 5.3 (H) 06/18/2017 1006   CL 101 09/16/2015 0927   CO2 25 06/18/2017 1006   BUN 14.3 06/18/2017 1006   CREATININE 0.9 06/18/2017 1006      Component Value Date/Time   CALCIUM 10.3 06/18/2017 1006   ALKPHOS 63 06/18/2017 1006   AST 15 06/18/2017 1006   ALT 17 06/18/2017 1006   BILITOT 0.35 06/18/2017 1006       RADIOGRAPHIC STUDIES: Ct Chest W Contrast  Result Date: 06/18/2017 CLINICAL DATA:  Lung cancer. EXAM: CT CHEST WITH CONTRAST TECHNIQUE: Multidetector CT imaging of the chest was performed during intravenous contrast administration. CONTRAST:  72m ISOVUE-300 IOPAMIDOL (ISOVUE-300) INJECTION 61% COMPARISON:  12/13/2016. FINDINGS: Cardiovascular: Atherosclerotic calcification of the arterial vasculature, including severe involvement of the coronary arteries. Heart is enlarged. Small amount of pericardial fluid is likely physiologic. Mediastinum/Nodes: No pathologically enlarged mediastinal, hilar or axillary lymph nodes. Esophagus is grossly unremarkable. Lungs/Pleura: Mild centrilobular emphysema. Left upper lobectomy with radiation fibrosis and volume loss in the left perihilar region. No pleural fluid. There is narrowing of the left anterior segmental bronchus. Airway is otherwise unremarkable. Upper Abdomen: Visualized portions of the liver, adrenal glands and right kidney are unremarkable. Low-attenuation lesions in the left kidney measure up to 3.3 cm and are likely cysts. Largest lesion is incompletely imaged. Visualized portions of the spleen, pancreas, stomach and bowel are grossly unremarkable. No upper abdominal adenopathy.  Atherosclerotic calcification the arterial vasculature. Proximal portion of an aortic stent graft is noted. Musculoskeletal: No worrisome lytic or sclerotic lesions. IMPRESSION: 1. Left upper lobectomy with post treatment changes in the left perihilar region. No evidence of metastatic disease. 2. Aortic atherosclerosis (ICD10-170.0). Severe coronary artery calcification. 3.  Emphysema (ICD10-J43.9). Electronically Signed   By: MLorin PicketM.D.   On: 06/18/2017 14:18   ASSESSMENT/PLAN:  This is a very pleasant 76years old white male  with metastatic non-small cell lung cancer that was initially diagnosed as a stage IIIa adenocarcinoma status post left upper lobectomy with mediastinal lymph node dissection followed by adjuvant systemic chemotherapy with cisplatin and Alimta. The patient also received stereotactic radiotherapy to metastatic brain lesions in January 2017. The patient is doing fine and he has no complaints.  Repeat CT scan of the chest performed recently showed no evidence for disease recurrence. I discussed the scan results with the patient and his wife and recommended for him to continue on observation with a repeat CT scan of the chest in 1 year. He was advised to call immediately if he has any concerning symptoms in the interval. All questions were answered. The patient knows to call the clinic with any problems, questions or concerns. We can certainly see the patient much sooner if necessary. I spent 10 minutes counseling the patient face to face. The total time spent in the appointment was 15 minutes.  Disclaimer: This note was dictated with voice recognition software. Similar sounding words can inadvertently be transcribed and may not be corrected upon review.

## 2017-07-08 ENCOUNTER — Other Ambulatory Visit: Payer: Self-pay | Admitting: Radiation Therapy

## 2017-07-08 DIAGNOSIS — C7949 Secondary malignant neoplasm of other parts of nervous system: Secondary | ICD-10-CM

## 2017-07-08 DIAGNOSIS — C7931 Secondary malignant neoplasm of brain: Secondary | ICD-10-CM

## 2017-07-14 DIAGNOSIS — J189 Pneumonia, unspecified organism: Secondary | ICD-10-CM | POA: Diagnosis not present

## 2017-07-16 ENCOUNTER — Telehealth: Payer: Self-pay | Admitting: *Deleted

## 2017-07-16 NOTE — Telephone Encounter (Signed)
Last scan few weeks ago was good. CXR is not as good as the scan. No need to do anything different.

## 2017-07-16 NOTE — Telephone Encounter (Addendum)
"  My husband had chest Xray at [facility].  Has Pneumonia to right lung and they also saw something in right lung.  They had nothing to compare Xray to and instructed me to bring disc to Dr.Mohamed.  How do I go about doing this?  Docycycline 100 mg twice a day and albuterol inhaler were ordered."   Instructed to bring to front registration desk.  Staff will bring disc to Dr. Julien Nordmann.   Routing call information to provider and collaborative to notify.

## 2017-07-16 NOTE — Telephone Encounter (Signed)
Received cxr on disc and given to Dr Julien Nordmann.

## 2017-07-17 ENCOUNTER — Telehealth: Payer: Self-pay | Admitting: Family Medicine

## 2017-07-17 NOTE — Telephone Encounter (Signed)
SW pt and he stated that he was seen at [facility]. I have sent a request for records.

## 2017-07-17 NOTE — Telephone Encounter (Signed)
I think f/u in 12 days is ok as long as he is gradually responding to treatment. When we get records from [facility] I will review them and we'll call him back if my recommendation changes.-thx

## 2017-07-17 NOTE — Telephone Encounter (Signed)
Pt advised and voiced understanding.   

## 2017-07-17 NOTE — Telephone Encounter (Signed)
Please advise 

## 2017-07-17 NOTE — Telephone Encounter (Signed)
Copied from [place] #[id number]. Topic: Quick Communication - See Telephone Encounter >> Jul 17, 2017 11:48 AM Bea Graff, NT wrote: CRM for notification. See Telephone encounter for: Patients wife is calling and states she is concerned with her husbands diagnosis from the urgent care. They stated he has pneumonia and gave doxycyline and an inhaler for him to take but to follow up with his PCP in 12 days. She thinks this is to long for him to wait to be seen. Pt refuses to make an appt before the 12 days. She wants to know what Dr. Anitra Lauth thinks they should do.   07/17/17.

## 2017-07-18 ENCOUNTER — Encounter: Payer: Self-pay | Admitting: Family Medicine

## 2017-07-18 ENCOUNTER — Encounter: Payer: Self-pay | Admitting: Internal Medicine

## 2017-07-18 ENCOUNTER — Telehealth: Payer: Self-pay | Admitting: Family Medicine

## 2017-07-18 NOTE — Telephone Encounter (Signed)
Discussed recent Urgent care visit and CXR finding of 4.5 cm spiculated mass in L upper lung field with pt today. He was aware.  In fact, he has already picked up a disc copy of his film and dropped it off at Dr. Worthy Flank office and have made his nurse aware so he can review it. Pt will keep his f/u with me scheduled for 07/30/17.

## 2017-07-30 ENCOUNTER — Other Ambulatory Visit: Payer: Medicare Other

## 2017-07-30 ENCOUNTER — Encounter: Payer: Medicare Other | Admitting: *Deleted

## 2017-07-30 ENCOUNTER — Ambulatory Visit: Payer: Self-pay | Admitting: Family Medicine

## 2017-07-31 ENCOUNTER — Ambulatory Visit: Payer: Medicare Other | Admitting: Neurology

## 2017-08-02 ENCOUNTER — Ambulatory Visit: Payer: Medicare Other | Admitting: Neurology

## 2017-08-02 ENCOUNTER — Encounter: Payer: Self-pay | Admitting: Neurology

## 2017-08-02 VITALS — BP 124/70 | HR 81 | Ht 64.0 in | Wt 144.0 lb

## 2017-08-02 DIAGNOSIS — C7931 Secondary malignant neoplasm of brain: Secondary | ICD-10-CM | POA: Diagnosis not present

## 2017-08-02 DIAGNOSIS — G40209 Localization-related (focal) (partial) symptomatic epilepsy and epileptic syndromes with complex partial seizures, not intractable, without status epilepticus: Secondary | ICD-10-CM

## 2017-08-02 MED ORDER — LAMOTRIGINE 200 MG PO TABS: ORAL_TABLET | ORAL | 5 refills | Status: DC

## 2017-08-02 NOTE — Progress Notes (Signed)
NEUROLOGY FOLLOW UP OFFICE NOTE  Jordan Mccullough [id number]  DOB: July 07, 1941  HISTORY OF PRESENT ILLNESS: I had the pleasure of seeing Jordan Mccullough in follow-up in the neurology clinic on 08/02/2017. The patient was last seen 4 months ago after he had a new onset convulsive seizure last 08/04/15 and was found to have brain metastases to the right frontal lobe. He was started on Keppra and did well seizure-free for almost 7 months until he had focal motor seizures affecting the left side of his face at the beginning of July 2017. Keppra dose was increased to 1000mg  qhs but he had difficulties with drowsiness and sleepiness even after naps. He was started on Lamotrigine and given a tapering schedule for Keppra. He had two facial seizures during the taper in March and Lamotrigine dose was increased to 200mg  BID. He was completely off the Beecher and only on Lamotrigine then reported two facial seizures on 12/21/16. Lamotrigine dose increased further to 300mg  BID. He became too dizzy the next day and reduced back to 300mg  in AM, 200mg  in PM with resolution of dizziness.   Since his last visit, he reports doing well with no simple partial seizures since May 2018. He is taking Lamotrigine 300mg  in AM, 200mg  in PM with no side effects, and is interested in lowering dose if possible. He continues to note difficulties with fine motor movements in both hands. We had discussed doing an EMG, which he did ot schedule. He reports that he just has to concentrate more when doing fine movements. He also has to concentrate on his left foot when walking, otherwise he tends to drag it. He denies any numbness/tingling, headaches, dizziness, diplopia, no falls. He continues to deal with PND and cough.   I personally reviewed MRI brain with and without contrast done 05/15/2017 which did not show any new lesions. The two hemorrhagic metastatic deposits in the right parietal lobe are stable. Stable mild to moderate chronic  microvascular disease.   HPI 10/11/15: This is a pleasant 76 yo RH man with a history of lung cancer s/p lobectomy, chemotherapy and radiation, hyperlipidemia, CAD s/p MI, peripheral vascular disease, abdominal aortic aneurysm, admitted to [facility] last 08/04/15 after a new onset seizure and found to have brain metastases. He states he got his flu shot then went to work, got home and started having a beer the threw up, his eyes got blurry to the point he could not see anything, then has no recollection of events. According to ER notes, after the nausea and vomiting, he decided to drink beer to feel better, his wife left the room then came back to find him with arms extended, shaking, eyes rolled back lasting 5-10 seconds. He recalls coming to without feeling confused, no focal weakness. He was brought to [facility] ER where CT head showed a 1.6 x 1.5 cm hyperdense mass in the right frontal lobe worrisome for focal metastases. MRI was done showing 2 enhancing masses in the right frontal lobe measuring up to 2 x 1.7 cm, no mass effect. He was started on Keppra. He underwent stereotactic radiation.   He called our office in July 2017 to report facial seizures. He had 5 in one day lasting 5-6 seconds, then stopped. His wife called our office to report 4 facial seizures on 7/19 and one on 7/20, these were lasting 5-6 seconds, he could feel it coming from the left jawline going up to his forehead, with facial twitching seen. No associated headache or  confusion. He has a constant feeling of numbness on the left side of his face, "like you had Novocaine." He continues to have left arm and leg weakness that has been chronic, no twitching involved. He feels he has a hard time controlling them. He denies any falls. Keppra dose was increased to 1000mg  qhs.  He denies any prior history of seizures. His wife denies any staring/unresponsive episodes, no gaps in time, olfactory/gustatory hallucinations, deja vu, rising epigastric  sensation, focal numbness/tingling/weakness, myoclonic jerks, headaches, dysarthria/dysphagia, bowel/bladder dysfunction. He was discharged home on Decadron and Keppra, and reports that he was "just totally out of it," functioning only 50%. He felt significantly better getting of Decadron, but continued to have generalized weakness, particularly in the legs, blurred vision, and reported this to his PCP Dr. Anitra Lauth. Keppra dose was reduced to 250mg  BID 5 days ago, and he reports that symptoms are better, but still there. He states that he wakes up feeling fine, but then starts feeling the symptoms after taking morning Keppra dose. He has mild 2-3 over 10 back pain when walking, relieved when he sits down. He continues to drink beer daily but has reduced to 1-2 a day from 3-5 beers a day. His wife reports he would drink 3 beer cases in a week.  Epilepsy Risk Factors: Brain metastases in the right frontal lobe. Otherwise he had a normal birth and early development. There is no history of febrile convulsions, CNS infections such as meningitis/encephalitis, significant traumatic brain injury, neurosurgical procedures, or family history of seizures.   I personally reviewed MRI brain with and without contrast done 08/12/15 which showed an 8x64mm centrally necrotic metastasis in the right posterior frontal lobe; there is a centrally necrotic mass measuring 88mm at the right vertex frontoparietal junction with regional vasogenic edema. Routine EEG 08/05/15 was a normal wake and sleep study  PAST MEDICAL HISTORY: Past Medical History:  Diagnosis Date  . AAA (abdominal aortic aneurysm) (HCC)    s/p repair (aortoiliac bipass; with persistent endograft leak in inferior mesenteric artery)--stable as of 01/2017 vascular f/u.  Marland Kitchen Asymptomatic cholelithiasis 07/2015   Incidental finding on PET CT  . Back pain 04/19/2016  . Coronary artery disease    MI 1992, S/P  PTCA; negative stress test in November 2011 with no  ischemia.   . Diverticulosis   . Hearing loss    Bilateral   . History of radiation therapy 11/10/13-12/12/13   lung,50Gy/49fx  . Hyperlipidemia    Crestor= myalgias  . Hypertension   . Lung cancer ([place]) 05/2013   non-small cell;  L upper lobectomy with mediastinal LN dissection, chemo, and radiation in 2014/2015. Remission until pt had questionable seizure 07/2015--MRI showed brain mets; palliative brain rad (stereotactic radiation therapy) started 08/25/15.  CT C/A/P clear 02/2016, 05/2016, and 11/2016.  Plan per onc is to repeat in 6 mo.  . Malignant neoplasm of upper lobe, left bronchus or lung ([place]) 10/29/2013   ?New spiculated 4.5cm mass in L upper lung field on CXR at [facility] in [place] 07/14/17.  Dr. Julien Nordmann (onc) recommended f/u CXR after abx course.  If mass unchanged then repeat CT chest.  . Malignant neoplasm of upper lobe, left bronchus or lung ([place]) 06/26/2013  . Myocardial infarction [facility]) 1992   Dr Angelena Form    . Peripheral vascular disease ([place]) 08/2012   4.8x4.6 cm infrarenal abdominal aortic fusiform aneurysm, 1.5 cm right common iliac artery aneurysm.  Type II endoleak from inferior mesenteric artery--Vasc surg referred  pt to interv rad for possible embolization of the leak as of 07/17/16.  . S/P radiation therapy [facility] 08/25/15   Stereotactic radiation therapy: frontoparietal 18gy,posterior frontal lobe 20gy,  . Seizure disorder ([place]) 2016/2017   as sequela of brain mets;  Grand mal seizure 07/2015, then got on keppra and was seizure-free until focal motor seizures of L side of face began 02/2016- these responded well to up-titration of keppra but pt had adverse side effects so eventually pt had to be switched over to lamictal 07/2016.  Marland Kitchen Shortness of breath   . Tobacco dependence    "Quit" 1992, but pt has smoked "on and off" since that time    MEDICATIONS:  Outpatient Encounter Medications as of 08/02/2017  Medication Sig  . aspirin EC 81 MG tablet Take 81 mg  by mouth daily.  Marland Kitchen atorvastatin (LIPITOR) 20 MG tablet Take 1 tablet (20 mg total) by mouth daily.  . budesonide-formoterol (SYMBICORT) 160-4.5 MCG/ACT inhaler Inhale 1-2 puffs into the lings every 12 hours. Gargle and Spit after use.  . Glucosamine-Chondroit-Vit C-Mn (GLUCOSAMINE 1500 COMPLEX PO) Take 1,500 mg by mouth daily.   Marland Kitchen lamoTRIgine (LAMICTAL) 200 MG tablet TAKE 1 TABLET BY MOUTH IN THE MORNING. TAKE 1.5 TABLETS BY MOUTH IN THE EVENING  . metoprolol tartrate (LOPRESSOR) 50 MG tablet TAKE 1 TABLET BY MOUTH TWICE A DAY  . Multiple Vitamin (MULTIVITAMIN) tablet Take 1 tablet by mouth every evening.   . pentoxifylline (TRENTAL) 400 MG CR tablet Take 1 tablet (400 mg total) by mouth 3 (three) times daily.  . predniSONE (DELTASONE) 50 MG tablet Take I tablet 13 hrs prior to CT scan, Take 1 tablet 7 hours prior to CT Scan, Take 1 tablet 1 hr before CT scan.  . vitamin E 1000 UNIT capsule Take 1 capsule (1,000 Units total) by mouth daily.  . diphenhydrAMINE (BENADRYL) 50 MG tablet Take 1 tablet (50 mg total) by mouth once. (Patient not taking: Reported on 07/03/2017)   No facility-administered encounter medications on file as of 08/02/2017.     ALLERGIES: Allergies  Allergen Reactions  . Fluvirin [Influenza Vac Split Quad] Nausea And Vomiting and Other (See Comments)    Seizure like activity?  Marland Kitchen Rosuvastatin Other (See Comments)    Myalgias and dark urine  . Iodinated Diagnostic Agents Hives    1 hive on lt cheek lasting approximately 1 hour on last 2 CT per pt; needs pre meds in future; 50 mg benadryl po 1 hr prior to exam per Dr. Weber Cooks 07/2014  11/2016 per Tery Sanfilippo, pt needs 13hr premeds per our policy.    FAMILY HISTORY: Family History  Problem Relation Age of Onset  . Hypertension Mother   . Prostate cancer Father        Prostate cancer  . Cancer Father        Brain Tumor  . Lung cancer Sister        NON SMOKER  . Cancer Sister   . Hyperlipidemia Sister   .  Hyperlipidemia Brother   . Heart attack Brother   . Hypertension Brother   . Diabetes Neg Hx   . Stroke Neg Hx     SOCIAL HISTORY: Social History   Socioeconomic History  . Marital status: Married    Spouse name: Not on file  . Number of children: Not on file  . Years of education: Not on file  . Highest education level: Not on file  Social Needs  . Financial resource strain: Not  on file  . Food insecurity - worry: Not on file  . Food insecurity - inability: Not on file  . Transportation needs - medical: Not on file  . Transportation needs - non-medical: Not on file  Occupational History  . Occupation: Retired Estate manager/land agent  . Smoking status: Current Every Day Smoker    Packs/day: 1.00    Years: 24.00    Pack years: 24.00    Types: Cigarettes    Last attempt to quit: 05/28/2013    Years since quitting: 4.1  . Smokeless tobacco: Never Used  . Tobacco comment: QUIT 15 YEARS AGO-07/03/17 smoking  Substance and Sexual Activity  . Alcohol use: Yes    Alcohol/week: 9.0 oz    Types: 3 Glasses of wine, 12 Cans of beer per week    Comment: 1-2 beers a day  . Drug use: No  . Sexual activity: Not on file  Other Topics Concern  . Not on file  Social History Narrative   HEART HEALTHY DIET.     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   MARRIED, 2 children who live in [place].  Smoked on and off since then.   ALCOHOL USE -YES- RED WINE SOCIALLY, couple of beers at night usually.    REVIEW OF SYSTEMS: Constitutional: No fevers, chills, or sweats, generalized fatigue, change in appetite Eyes: No visual changes, double vision, eye pain Ear, nose and throat: No hearing loss, ear pain, nasal congestion, sore throat Cardiovascular: No chest pain, palpitations Respiratory:  No shortness of breath at rest or with exertion, wheezes GastrointestinaI: No nausea, vomiting, diarrhea, abdominal pain, fecal incontinence Genitourinary:  No dysuria, urinary retention  or frequency Musculoskeletal:  No neck pain, +back pain Integumentary: No rash, pruritus, skin lesions Neurological: as above Psychiatric: No depression,+ insomnia, no anxiety Endocrine: No palpitations, fatigue, diaphoresis, mood swings, change in appetite, change in weight, increased thirst Hematologic/Lymphatic:  No anemia, purpura, petechiae. Allergic/Immunologic: no itchy/runny eyes, nasal congestion, recent allergic reactions, rashes  PHYSICAL EXAM: Vitals:   08/02/17 1427  BP: 124/70  Pulse: 81  SpO2: 95%   General: No acute distress Head:  Normocephalic/atraumatic Neck: supple, no paraspinal tenderness, full range of motion Heart:  Regular rate and rhythm Lungs:  Clear to auscultation bilaterally Back: No paraspinal tenderness Skin/Extremities: No rash, no edema. No pain to palpation of right shoulder Neurological Exam: alert and oriented to person, place, and time. No aphasia or dysarthria. Fund of knowledge is appropriate.  Recent and remote memory are intact.  Attention and concentration are normal.    Able to name objects and repeat phrases. Cranial nerves: Pupils equal, round, reactive to light. Extraocular movements intact with no nystagmus. Visual fields full. Facial sensation intact. No facial asymmetry. Tongue, uvula, palate midline.  Motor: Bulk and tone normal, muscle strength 5/5 throughout except for 4/5 bilateral APB, no pronator drift. Good finger taps. Sensation to light touch and pin intact.  No extinction to double simultaneous stimulation.  Deep tendon reflexes brisk +2 throughout, toes downgoing.  Finger to nose testing intact.  Gait narrow-based and steady without cane (uses cane for balance), no ataxia.   IMPRESSION: This is a pleasant 76 yo RH man with a history of lung cancer s/p lobectomy, radiation, chemotherapy, found to have 2 right frontal brain metastases after he had a convulsive seizure on 08/04/15. Routine EEG normal. He started having focal motor  seizures affecting the face in July 2017 and difficulty tolerating higher doses of  Keppra. He has been switched to Lamotrigine, currently on 300mg  in AM, 200mg  in PM (did not tolerate higher dose). He denies any further simple partial seizures since May 2018 and is interested in lowering Lamotrigine dose (not because of side effects). We discussed trying to lower back to 200mg  BID, but if simple partial seizures recur, go back up on dose. He is aware of [place] driving laws to stop driving after a seizure, until 6 months seizure-free. He will follow-up in 6 months and knows to call for any problems.  Thank you for allowing me to participate in his care.  Please do not hesitate to call for any questions or concerns.  The duration of this appointment visit was 15 minutes of face-to-face time with the patient.  Greater than 50% of this time was spent in counseling, explanation of diagnosis, planning of further management, and coordination of care.   Ellouise Newer, M.D.   CC: Dr. Anitra Lauth, Dr. Sherwood Gambler, Dr. Julien Nordmann

## 2017-08-02 NOTE — Patient Instructions (Signed)
It's great seeing you! 1. Let's try reducing Lamotrigine 200mg : Take 1 tablet twice a day. If symptoms change, go back to taking 1 tablet in AM, 1.5 tablet in PM 2. Follow-up in 6 months, call for any changes  Seizure Precautions: 1. If medication has been prescribed for you to prevent seizures, take it exactly as directed.  Do not stop taking the medicine without talking to your doctor first, even if you have not had a seizure in a long time.   2. Avoid activities in which a seizure would cause danger to yourself or to others.  Don't operate dangerous machinery, swim alone, or climb in high or dangerous places, such as on ladders, roofs, or girders.  Do not drive unless your doctor says you may.  3. If you have any warning that you may have a seizure, lay down in a safe place where you can't hurt yourself.    4.  No driving for 6 months from last seizure, as per [facility].   Please refer to the following link on the [place] website for more information: http://[email or web address]/[email or web address]   5.  Maintain good sleep hygiene. Avoid alcohol  6.  Contact your doctor if you have any problems that may be related to the medicine you are taking.  7.  Call 911 and bring the patient back to the ED if:        A.  The seizure lasts longer than 5 minutes.       B.  The patient doesn't awaken shortly after the seizure  C.  The patient has new problems such as difficulty seeing, speaking or moving  D.  The patient was injured during the seizure  E.  The patient has a temperature over 102 F (39C)  F.  The patient vomited and now is having trouble breathing

## 2017-08-06 ENCOUNTER — Encounter: Payer: Self-pay | Admitting: Neurology

## 2017-08-07 ENCOUNTER — Other Ambulatory Visit: Payer: Self-pay | Admitting: Family Medicine

## 2017-08-15 NOTE — Progress Notes (Signed)
Jordan Smiles. [place] 76 y.o. man withmetastatic non-small cell lung cancer who received SRS to 2 lesions in the frontoparietal region radiation completed 08-25-15, review 08-23-17  MRI brain w wo contrast, FU.   Headache:No Pain: NoNo Dizziness:No Nausea/Vomiting/Ataxia:No Visional changes(Blurred/double vision, blind spots and peripheral vision changes): wears glasses Ring in ears:None, HOH left ear has a hearing aid does not wear Fatigue:Denies fatigue Cognitive changes: Alert and oriented x 3 with fluent speech,able to complete sentences without difficulty with word finding or organization of sentences.    Weight:    07-03-17 Saw Dr. Julien Nordmann 76 years old white male with metastatic non-small cell lung cancer that was initially diagnosed as a stage IIIa adenocarcinoma status post left upper lobectomy with mediastinal lymph node dissection followed by adjuvant systemic chemotherapy with cisplatin and Alimta. The patient also received stereotactic radiotherapy to metastatic brain lesions in January 2017. The patient is doing fine and he has no complaints.  Repeat CT scan of the chest performed recently showed no evidence for disease recurrence. I discussed the scan results with the patient and his wife and recommended for him to continue on observation with a repeat CT scan of the chest in 1 year. Wt Readings from Last 3 Encounters:  08/27/17 144 lb 9.6 oz (65.6 kg)  08/02/17 144 lb (65.3 kg)  07/03/17 141 lb 14.4 oz (64.4 kg)  BP 135/83 (BP Location: Left Arm, Patient Position: Sitting, Cuff Size: Normal)   Pulse 72   Temp 97.9 F (36.6 C) (Oral)   Resp 18   Ht 5\' 4"  (1.626 m)   Wt 144 lb 9.6 oz (65.6 kg)   SpO2 99%   BMI 24.82 kg/m

## 2017-08-23 ENCOUNTER — Ambulatory Visit
Admission: RE | Admit: 2017-08-23 | Discharge: 2017-08-23 | Disposition: A | Discharge status: Home / Self Care | Payer: Medicare Other | Source: Ambulatory Visit | Attending: Radiation Oncology | Admitting: Radiation Oncology

## 2017-08-23 ENCOUNTER — Other Ambulatory Visit: Payer: Self-pay | Admitting: Family Medicine

## 2017-08-23 DIAGNOSIS — C7931 Secondary malignant neoplasm of brain: Secondary | ICD-10-CM

## 2017-08-23 DIAGNOSIS — G9389 Other specified disorders of brain: Secondary | ICD-10-CM | POA: Diagnosis not present

## 2017-08-23 DIAGNOSIS — C7949 Secondary malignant neoplasm of other parts of nervous system: Secondary | ICD-10-CM

## 2017-08-23 MED ORDER — GADOBENATE DIMEGLUMINE 529 MG/ML IV SOLN: 13.0000 mL | Freq: Once | INTRAVENOUS | Status: DC | PRN

## 2017-08-27 ENCOUNTER — Ambulatory Visit
Admission: RE | Admit: 2017-08-27 | Discharge: 2017-08-27 | Disposition: A | Discharge status: Home / Self Care | Payer: Medicare Other | Source: Ambulatory Visit | Attending: Radiation Oncology | Admitting: Radiation Oncology

## 2017-08-27 ENCOUNTER — Other Ambulatory Visit: Payer: Self-pay

## 2017-08-27 ENCOUNTER — Encounter: Payer: Self-pay | Admitting: Radiation Oncology

## 2017-08-27 VITALS — BP 135/83 | HR 72 | Temp 97.9°F | Resp 18 | Ht 64.0 in | Wt 144.6 lb

## 2017-08-27 DIAGNOSIS — Z808 Family history of malignant neoplasm of other organs or systems: Secondary | ICD-10-CM | POA: Insufficient documentation

## 2017-08-27 DIAGNOSIS — Z923 Personal history of irradiation: Secondary | ICD-10-CM | POA: Insufficient documentation

## 2017-08-27 DIAGNOSIS — I252 Old myocardial infarction: Secondary | ICD-10-CM | POA: Insufficient documentation

## 2017-08-27 DIAGNOSIS — Z8249 Family history of ischemic heart disease and other diseases of the circulatory system: Secondary | ICD-10-CM | POA: Diagnosis not present

## 2017-08-27 DIAGNOSIS — I2510 Atherosclerotic heart disease of native coronary artery without angina pectoris: Secondary | ICD-10-CM | POA: Insufficient documentation

## 2017-08-27 DIAGNOSIS — M791 Myalgia, unspecified site: Secondary | ICD-10-CM | POA: Diagnosis not present

## 2017-08-27 DIAGNOSIS — Z888 Allergy status to other drugs, medicaments and biological substances status: Secondary | ICD-10-CM | POA: Insufficient documentation

## 2017-08-27 DIAGNOSIS — I10 Essential (primary) hypertension: Secondary | ICD-10-CM | POA: Insufficient documentation

## 2017-08-27 DIAGNOSIS — G40909 Epilepsy, unspecified, not intractable, without status epilepticus: Secondary | ICD-10-CM | POA: Insufficient documentation

## 2017-08-27 DIAGNOSIS — I714 Abdominal aortic aneurysm, without rupture: Secondary | ICD-10-CM | POA: Diagnosis not present

## 2017-08-27 DIAGNOSIS — Z8042 Family history of malignant neoplasm of prostate: Secondary | ICD-10-CM | POA: Diagnosis not present

## 2017-08-27 DIAGNOSIS — R51 Headache: Secondary | ICD-10-CM | POA: Diagnosis not present

## 2017-08-27 DIAGNOSIS — I739 Peripheral vascular disease, unspecified: Secondary | ICD-10-CM | POA: Diagnosis not present

## 2017-08-27 DIAGNOSIS — Z08 Encounter for follow-up examination after completed treatment for malignant neoplasm: Secondary | ICD-10-CM | POA: Diagnosis not present

## 2017-08-27 DIAGNOSIS — Z79899 Other long term (current) drug therapy: Secondary | ICD-10-CM | POA: Diagnosis not present

## 2017-08-27 DIAGNOSIS — C7931 Secondary malignant neoplasm of brain: Secondary | ICD-10-CM

## 2017-08-27 DIAGNOSIS — C7949 Secondary malignant neoplasm of other parts of nervous system: Secondary | ICD-10-CM | POA: Insufficient documentation

## 2017-08-27 DIAGNOSIS — R600 Localized edema: Secondary | ICD-10-CM | POA: Diagnosis not present

## 2017-08-27 DIAGNOSIS — F17210 Nicotine dependence, cigarettes, uncomplicated: Secondary | ICD-10-CM | POA: Diagnosis not present

## 2017-08-27 DIAGNOSIS — Z9889 Other specified postprocedural states: Secondary | ICD-10-CM | POA: Diagnosis not present

## 2017-08-27 DIAGNOSIS — C3412 Malignant neoplasm of upper lobe, left bronchus or lung: Secondary | ICD-10-CM | POA: Diagnosis not present

## 2017-08-27 DIAGNOSIS — E785 Hyperlipidemia, unspecified: Secondary | ICD-10-CM | POA: Insufficient documentation

## 2017-08-27 DIAGNOSIS — Z801 Family history of malignant neoplasm of trachea, bronchus and lung: Secondary | ICD-10-CM | POA: Insufficient documentation

## 2017-08-27 DIAGNOSIS — Z7982 Long term (current) use of aspirin: Secondary | ICD-10-CM | POA: Insufficient documentation

## 2017-08-27 NOTE — Progress Notes (Signed)
Radiation Oncology         [phone] ________________________________  Name: TEREL BANN MRN: [id number]  Date: 08/27/2017  DOB: 11-07-1940  Follow-Up Visit Note  CC: McGowen, Adrian Blackwater, MD  Gaye Pollack, MD  Diagnosis:   Metastatic Lung Cancer  Interval Since Radiotherapy: 2 years  08/25/15 SRS Treatment:    1.  PTV1 Rt Vertex Frontoparietal 51mm target was treated using 4 Arcs to a prescription dose of 18 Gy. ExacTrac Snap verification was performed for each couch angle.  2.  PTV2  Rt Posterior Frontal Lobe 105mm target was treated using 4 Arcs to a prescription dose of 20 Gy. ExacTrac Snap verification was performed for each couch angle.   Narrative:   Mr. Lazo is a very pleasant 77 y.o. gentleman with a history of metastatic non-small cell lung cancer who received SRS to 2 lesions in the frontoparietal region which he completed in January of 2017. He has had stability in the brain since his treatment. He underwent an MRI of the brain on 08/23/17 which showed stability of his previously treated disease. He continues to take vitamin E, and Trental. He has been offered the opportunity to discontinue but has had rebound headaches when trying to discontinue and for this reason continues. He also continues on Lamictal under the care of Dr. Delice Lesch.   On review of systems, the patient reports that he is doing well overall. He denies any headaches, visual or auditory disturbances. He denies any chest pain, shortness of breath, cough, fevers, chills, night sweats, unintended weight changes. He denies any bowel or bladder disturbances, and denies abdominal pain, nausea or vomiting. He denies any new musculoskeletal or joint aches or pains, new skin lesions or concerns. A complete review of systems is obtained and is otherwise negative.  Past Medical History:  Past Medical History:  Diagnosis Date  . AAA (abdominal aortic aneurysm) (HCC)    s/p repair (aortoiliac bipass; with persistent  endograft leak in inferior mesenteric artery)--stable as of 01/2017 vascular f/u.  Marland Kitchen Asymptomatic cholelithiasis 07/2015   Incidental finding on PET CT  . Back pain 04/19/2016  . Coronary artery disease    MI 1992, S/P  PTCA; negative stress test in November 2011 with no ischemia.   . Diverticulosis   . Hearing loss    Bilateral   . History of radiation therapy 11/10/13-12/12/13   lung,50Gy/51fx  . Hyperlipidemia    Crestor= myalgias  . Hypertension   . Lung cancer ([place]) 05/2013   non-small cell;  L upper lobectomy with mediastinal LN dissection, chemo, and radiation in 2014/2015. Remission until pt had questionable seizure 07/2015--MRI showed brain mets; palliative brain rad (stereotactic radiation therapy) started 08/25/15.  CT C/A/P clear 02/2016, 05/2016, and 11/2016.  Plan per onc is to repeat in 6 mo.  . Malignant neoplasm of upper lobe, left bronchus or lung ([place]) 10/29/2013   ?New spiculated 4.5cm mass in L upper lung field on CXR at [facility] in [place] 07/14/17.  Dr. Julien Nordmann (onc) recommended f/u CXR after abx course.  If mass unchanged then repeat CT chest.  . Malignant neoplasm of upper lobe, left bronchus or lung ([place]) 06/26/2013  . Myocardial infarction [facility]) 1992   Dr Angelena Form    . Peripheral vascular disease ([place]) 08/2012   4.8x4.6 cm infrarenal abdominal aortic fusiform aneurysm, 1.5 cm right common iliac artery aneurysm.  Type II endoleak from inferior mesenteric artery--Vasc surg referred pt to interv rad for possible embolization of  the leak as of 07/17/16.  . S/P radiation therapy [facility] 08/25/15   Stereotactic radiation therapy: frontoparietal 18gy,posterior frontal lobe 20gy,  . Seizure disorder ([place]) 2016/2017   as sequela of brain mets;  Grand mal seizure 07/2015, then got on keppra and was seizure-free until focal motor seizures of L side of face began 02/2016- these responded well to up-titration of keppra but pt had adverse side effects so eventually pt had to be  switched over to lamictal 07/2016.  Marland Kitchen Shortness of breath   . Tobacco dependence    "Quit" 1992, but pt has smoked "on and off" since that time    Past Surgical History: Past Surgical History:  Procedure Laterality Date  . ABDOMINAL AORTIC ENDOVASCULAR STENT GRAFT N/A 05/07/2014   Procedure: ABDOMINAL AORTIC ENDOVASCULAR STENT GRAFT;  Surgeon: Serafina Mitchell, MD;  Location: [facility] OR;  Service: Vascular;  Laterality: N/A;  . Carotid duplex dopplers  07/2015   1-39% on R, no signif dz noted on L  . COLONOSCOPY W/ POLYPECTOMY  2003    negative 2010,due 2020; Dr Olevia Perches  . CORONARY ANGIOPLASTY     no stents  . CYSTOSCOPY/RETROGRADE/URETEROSCOPY Bilateral 10/09/2012   Procedure: BILATERAL RETROGRADE bladder and urethral BIOPSY ;  Surgeon: Molli Hazard, MD;  Location: [facility];  Service: Urology;  Laterality: Bilateral;  BILATERAL RETROGRADE   . EEG  08/05/15   Pt placed on keppra just prior to this test due to having ? seizure (MRI showed brain mets)  . HERNIA REPAIR Bilateral    Inguinal  . INGUINAL HERIIORRHAPHY BILATERALLY    . IR GENERIC HISTORICAL  07/20/2016   IR RADIOLOGIST EVAL & MGMT 07/20/2016 [facility] INTERV RAD  . IR GENERIC HISTORICAL  08/02/2016   IR RADIOLOGIST EVAL & MGMT 08/02/2016 Sandi Mariscal, MD [facility] INTERV RAD  . MEDIASTINOSCOPY N/A 05/01/2013   Procedure: MEDIASTINOSCOPY;  Surgeon: Gaye Pollack, MD;  Location: [facility] OR;  Service: Thoracic;  Laterality: N/A;  . PFTs  04/2013   Minimal obstructive airway disease  . PILONIDAL CYST EXCISION    . PROSTATE BIOPSY N/A 10/09/2012   Procedure: PROSTATIC URETHRAL BIOPSY;  Surgeon: Molli Hazard, MD;  Location: [facility];  Service: Urology;  Laterality: N/A;  PROSTATIC URETHRAL BIOPSY   . PTCA  1992  . ROTATOR CUFF REPAIR     Bilateral  . THOROCOTOMY WITH LOBECTOMY Left 05/29/2013   Procedure: LEFT THOROCOTOMY WITH LEFT UPPER LOBE LOBECTOMY;  Surgeon: Gaye Pollack, MD;  Location: [place];  Service: Thoracic;   Laterality: Left;  Marland Kitchen VIDEO BRONCHOSCOPY N/A 05/01/2013   Procedure: VIDEO BRONCHOSCOPY;  Surgeon: Gaye Pollack, MD;  Location: [facility] OR;  Service: Thoracic;  Laterality: N/A;    Social History:  Social History   Socioeconomic History  . Marital status: Married    Spouse name: Not on file  . Number of children: Not on file  . Years of education: Not on file  . Highest education level: Not on file  Social Needs  . Financial resource strain: Not on file  . Food insecurity - worry: Not on file  . Food insecurity - inability: Not on file  . Transportation needs - medical: Not on file  . Transportation needs - non-medical: Not on file  Occupational History  . Occupation: Retired Estate manager/land agent  . Smoking status: Current Every Day Smoker    Packs/day: 1.00    Years: 24.00    Pack years: 24.00    Types:  Cigarettes    Last attempt to quit: 05/28/2013    Years since quitting: 4.2  . Smokeless tobacco: Never Used  . Tobacco comment: QUIT 15 YEARS AGO-07/03/17 smoking  Substance and Sexual Activity  . Alcohol use: Yes    Alcohol/week: 9.0 oz    Types: 3 Glasses of wine, 12 Cans of beer per week    Comment: 1-2 beers a day  . Drug use: No  . Sexual activity: Not on file  Other Topics Concern  . Not on file  Social History Narrative   HEART HEALTHY DIET.     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   MARRIED, 2 children who live in [place].  Smoked on and off since then.   ALCOHOL USE -YES- RED WINE SOCIALLY, couple of beers at night usually.    Family History: Family History  Problem Relation Age of Onset  . Hypertension Mother   . Prostate cancer Father        Prostate cancer  . Cancer Father        Brain Tumor  . Lung cancer Sister        NON SMOKER  . Cancer Sister   . Hyperlipidemia Sister   . Hyperlipidemia Brother   . Heart attack Brother   . Hypertension Brother   . Diabetes Neg Hx   . Stroke Neg Hx     ALLERGIES:  is allergic to  fluvirin [influenza vac split quad]; rosuvastatin; and iodinated diagnostic agents.  Meds: Current Outpatient Medications  Medication Sig Dispense Refill  . aspirin EC 81 MG tablet Take 81 mg by mouth daily.    Marland Kitchen atorvastatin (LIPITOR) 20 MG tablet Take 1 tablet (20 mg total) by mouth daily. 30 tablet 5  . budesonide-formoterol (SYMBICORT) 160-4.5 MCG/ACT inhaler INHALE 1-2 PUFFS INTO THE LINGS EVERY 12 HOURS. GARGLE AND SPIT AFTER USE. 10.2 Inhaler 3  . Glucosamine-Chondroit-Vit C-Mn (GLUCOSAMINE 1500 COMPLEX PO) Take 1,500 mg by mouth daily.     Marland Kitchen lamoTRIgine (LAMICTAL) 200 MG tablet TAKE 1 TABLET BY MOUTH IN THE MORNING. TAKE 1.5 TABLETS BY MOUTH IN THE EVENING 72 tablet 5  . metoprolol tartrate (LOPRESSOR) 50 MG tablet TAKE 1 TABLET BY MOUTH TWICE A DAY 180 tablet 1  . Multiple Vitamin (MULTIVITAMIN) tablet Take 1 tablet by mouth every evening.     . pentoxifylline (TRENTAL) 400 MG CR tablet Take 1 tablet (400 mg total) by mouth 3 (three) times daily. 90 tablet 5  . vitamin E 1000 UNIT capsule Take 1 capsule (1,000 Units total) by mouth daily. 30 capsule 12  . diphenhydrAMINE (BENADRYL) 50 MG tablet Take 1 tablet (50 mg total) by mouth once. (Patient not taking: Reported on 07/03/2017) 1 tablet 0   No current facility-administered medications for this encounter.     Physical Findings:  height is 5\' 4"  (1.626 m) and weight is 144 lb 9.6 oz (65.6 kg). His oral temperature is 97.9 F (36.6 C). His blood pressure is 135/83 and his pulse is 72. His respiration is 18 and oxygen saturation is 99%.  In general this is a well appearing Caucasian male in no acute distress. He's alert and oriented x4 and appropriate throughout the examination. Cardiopulmonary assessment is negative for acute distress and he exhibits normal effort. Neurologic assessment continues to be stable with 4/5 strength of his left proximal lower extremity without new focal changes.   Lab Findings: Lab Results  Component  Value Date   WBC 7.1 06/18/2017  HGB 15.7 06/18/2017   HCT 46.3 06/18/2017   MCV 86.7 06/18/2017   PLT 243 06/18/2017     Radiographic Findings: Mr Jeri Cos GB Contrast  Result Date: 08/23/2017 CLINICAL DATA:  Metastatic lung cancer post treatment.  Follow-up. Creatinine was obtained on site at [facility] at [address] Results: Creatinine 0.8 mg/dL. EXAM: MRI HEAD WITHOUT AND WITH CONTRAST TECHNIQUE: Multiplanar, multiecho pulse sequences of the brain and surrounding structures were obtained without and with intravenous contrast. CONTRAST:  13 mL MultiHance IV COMPARISON:  MRI 05/15/2017, 02/12/2017 FINDINGS: Brain: Treated lesion of right parietal cortex is unchanged measuring 11 x 18 mm. Chronic blood products are stable. Surrounding white matter edema is stable. Treated lesion in the high right parietal lobe also stable measuring 7 x 11 mm. The lesion contains chronic blood products and mild surrounding edema which are stable. No new lesions identified. Mild atrophy without hydrocephalus. No acute infarct or fluid collection. No midline shift. Vascular: Normal arterial flow void Skull and upper cervical spine: Negative Sinuses/Orbits: Mild mucosal edema paranasal sinuses.  Normal orbit. Other: None IMPRESSION: Two treated lesions in the right parietal lobe are stable from the prior study. Surrounding white matter edema stable. No new lesions. Electronically Signed   By: Franchot Gallo M.D.   On: 08/23/2017 10:19    Impression/Plan: 1. Recurrent Stage IIIA, T2a, N2, M0, NSCLC, adenocarcinoma of the left upper lobe. Mr. Buenaventura appears to be doing well radiographically and clinically. We discussed his MRI results today in the office which demonstrate continued stability and no new lesions.  After reviewing his scans. He will continue trental and vitamin E and now that he is two years post treatment, he will proceed with MRI scans in 4 month intervals for a few intervals prior to  considering scans at 6 month intervals. He will continue Lamictal under the care of Dr. Delice Lesch.  He remains in observation with Dr. Julien Nordmann as well and we will see him back following his next 4 month MRI. The patient will contact us with questions or concerns prior to his next visit.     Carola Rhine, PAC

## 2017-09-05 ENCOUNTER — Other Ambulatory Visit (HOSPITAL_COMMUNITY): Payer: Self-pay | Admitting: Interventional Radiology

## 2017-09-05 DIAGNOSIS — IMO0001 Reserved for inherently not codable concepts without codable children: Secondary | ICD-10-CM

## 2017-09-05 DIAGNOSIS — T82330D Leakage of aortic (bifurcation) graft (replacement), subsequent encounter: Secondary | ICD-10-CM

## 2017-09-07 ENCOUNTER — Other Ambulatory Visit: Payer: Self-pay | Admitting: Family Medicine

## 2017-09-07 NOTE — Telephone Encounter (Signed)
SW pt and he stated that he is still taking this medication. It was marked as not taking/change it therapy at his visit on 07/03/17 with Dr. Julien Nordmann. Rx sent to pharmacy.

## 2017-09-17 ENCOUNTER — Other Ambulatory Visit: Payer: Self-pay

## 2017-09-17 MED ORDER — ATORVASTATIN CALCIUM 20 MG PO TABS: 20.0000 mg | ORAL_TABLET | Freq: Every day | ORAL | 0 refills | Status: DC

## 2017-09-19 ENCOUNTER — Other Ambulatory Visit: Payer: Self-pay | Admitting: Family Medicine

## 2017-09-19 NOTE — Telephone Encounter (Signed)
LOV: 09/27/16 NOV: None  Please advise. Thanks.

## 2017-09-26 ENCOUNTER — Telehealth: Payer: Self-pay | Admitting: Radiology

## 2017-09-26 NOTE — Telephone Encounter (Signed)
Left message with patient's wife for patient to contact our office to schedule follow up appointment as requested by Dr. Ronny Bacon.  Mrs Delia states that patient will call back to schedule follow up.  Matej Sappenfield Riki Rusk, RN 09/26/2017 11:45 AM

## 2017-09-27 ENCOUNTER — Other Ambulatory Visit: Payer: Self-pay | Admitting: Radiation Therapy

## 2017-09-27 DIAGNOSIS — C7949 Secondary malignant neoplasm of other parts of nervous system: Secondary | ICD-10-CM

## 2017-09-27 DIAGNOSIS — C7931 Secondary malignant neoplasm of brain: Secondary | ICD-10-CM

## 2017-10-11 ENCOUNTER — Encounter: Payer: Self-pay | Admitting: Radiology

## 2017-10-22 ENCOUNTER — Other Ambulatory Visit: Payer: Self-pay | Admitting: *Deleted

## 2017-10-22 DIAGNOSIS — IMO0002 Reserved for concepts with insufficient information to code with codable children: Secondary | ICD-10-CM

## 2017-10-22 DIAGNOSIS — T82330A Leakage of aortic (bifurcation) graft (replacement), initial encounter: Secondary | ICD-10-CM

## 2017-10-23 ENCOUNTER — Other Ambulatory Visit: Payer: Self-pay | Admitting: Radiology

## 2017-10-23 MED ORDER — DIPHENHYDRAMINE HCL 50 MG PO TABS: 50.0000 mg | ORAL_TABLET | ORAL | 0 refills | Status: DC

## 2017-10-23 MED ORDER — PREDNISONE 50 MG PO TABS
50.0000 mg | ORAL_TABLET | ORAL | 0 refills | Status: DC
Start: 2017-11-07 — End: 2018-06-27

## 2017-11-06 ENCOUNTER — Ambulatory Visit (INDEPENDENT_AMBULATORY_CARE_PROVIDER_SITE_OTHER): Payer: Medicare Other | Admitting: Podiatry

## 2017-11-06 ENCOUNTER — Encounter: Payer: Self-pay | Admitting: Podiatry

## 2017-11-06 VITALS — BP 162/88 | HR 84 | Ht 62.0 in | Wt 145.0 lb

## 2017-11-06 DIAGNOSIS — B351 Tinea unguium: Secondary | ICD-10-CM | POA: Diagnosis not present

## 2017-11-06 DIAGNOSIS — M79671 Pain in right foot: Secondary | ICD-10-CM

## 2017-11-06 DIAGNOSIS — L600 Ingrowing nail: Secondary | ICD-10-CM

## 2017-11-06 DIAGNOSIS — M79672 Pain in left foot: Secondary | ICD-10-CM | POA: Diagnosis not present

## 2017-11-06 NOTE — Progress Notes (Signed)
   SUBJECTIVE: 77 y.o. year old male presents complaining of painful nails. Having pain for 3 month with fungus and ingrown nail.  Left lung surgery on left 3 years ago. Had done radiation treatment and left him with right side weakness.  Review of Systems  HENT: Negative.   Eyes: Negative.   Respiratory:       Positive history of left lung cancer surgery followed with radiation treatment.  Gastrointestinal: Negative.   Genitourinary: Negative.   Musculoskeletal: Negative.      OBJECTIVE: DERMATOLOGIC EXAMINATION: Nails:Thick dystrophic nails with fungal debris.  VASCULAR EXAMINATION OF LOWER LIMBS: All pedal pulses are palpable except right DP. Capillary Filling times within 3 seconds in all digits.  No edema or erythema noted. Temperature gradient from tibial crest to dorsum of foot is within normal bilateral.  NEUROLOGIC EXAMINATION OF THE LOWER LIMBS: Achilles DTR is present and within normal. Monofilament (Semmes-Weinstein 10-gm) sensory testing positive 6 out of 6, bilateral. Vibratory sensations(128Hz  turning fork) intact at medial and lateral forefoot bilateral.  Sharp and Dull discriminatory sensations at the plantar ball of hallux is intact bilateral.   MUSCULOSKELETAL EXAMINATION: No gross deformities noted.  ASSESSMENT: Onychomycosis bilateral. Painful nails both great toes.  PLAN: Reviewed clinical findings and available treatment options. All nails debrided. Return in 3 months or sooner if needed.

## 2017-11-06 NOTE — Patient Instructions (Signed)
Seen for hypertrophic nails. All nails debrided. Return in 3 months or sooner if needed.

## 2017-11-08 ENCOUNTER — Other Ambulatory Visit (HOSPITAL_COMMUNITY): Payer: Medicare Other

## 2017-11-08 ENCOUNTER — Ambulatory Visit
Admission: RE | Admit: 2017-11-08 | Discharge: 2017-11-08 | Disposition: A | Discharge status: Home / Self Care | Payer: Medicare Other | Source: Ambulatory Visit | Attending: Interventional Radiology | Admitting: Interventional Radiology

## 2017-11-08 ENCOUNTER — Ambulatory Visit (HOSPITAL_COMMUNITY)
Admission: RE | Admit: 2017-11-08 | Discharge: 2017-11-08 | Disposition: A | Discharge status: Home / Self Care | Payer: Medicare Other | Source: Ambulatory Visit | Attending: Interventional Radiology | Admitting: Interventional Radiology

## 2017-11-08 ENCOUNTER — Encounter (HOSPITAL_COMMUNITY): Payer: Self-pay

## 2017-11-08 DIAGNOSIS — IMO0001 Reserved for inherently not codable concepts without codable children: Secondary | ICD-10-CM

## 2017-11-08 DIAGNOSIS — N261 Atrophy of kidney (terminal): Secondary | ICD-10-CM | POA: Diagnosis not present

## 2017-11-08 DIAGNOSIS — N400 Enlarged prostate without lower urinary tract symptoms: Secondary | ICD-10-CM | POA: Diagnosis not present

## 2017-11-08 DIAGNOSIS — X58XXXD Exposure to other specified factors, subsequent encounter: Secondary | ICD-10-CM | POA: Insufficient documentation

## 2017-11-08 DIAGNOSIS — T82330D Leakage of aortic (bifurcation) graft (replacement), subsequent encounter: Secondary | ICD-10-CM | POA: Insufficient documentation

## 2017-11-08 DIAGNOSIS — K8020 Calculus of gallbladder without cholecystitis without obstruction: Secondary | ICD-10-CM | POA: Insufficient documentation

## 2017-11-08 DIAGNOSIS — C7931 Secondary malignant neoplasm of brain: Secondary | ICD-10-CM | POA: Diagnosis not present

## 2017-11-08 HISTORY — PX: IR RADIOLOGIST EVAL & MGMT: IMG5224

## 2017-11-08 LAB — POCT I-STAT CREATININE: Creatinine, Ser: 1 mg/dL (ref 0.61–1.24)

## 2017-11-08 MED ORDER — IOPAMIDOL (ISOVUE-370) INJECTION 76%
INTRAVENOUS | Status: AC
  Administered 2017-11-08: 100 mL via INTRAVENOUS
  Filled 2017-11-08: qty 100

## 2017-11-08 MED ORDER — IOPAMIDOL (ISOVUE-370) INJECTION 76%
100.0000 mL | Freq: Once | INTRAVENOUS | Status: AC | PRN
  Administered 2017-11-08: 100 mL via INTRAVENOUS

## 2017-11-08 NOTE — Progress Notes (Signed)
Patient ID: Jordan Mccullough, male   DOB: 23-Jan-1941, 77 y.o.   MRN: [id number]         Chief Complaint: Endoleak Evaluation and Management  Referring Physician(s): Brabham  History of Present Illness: Jordan Mccullough is a 77 y.o. male with complex past medical history significant for smoking, hypertension, hyperlipidemia, CAD and lung cancer (with brain metastasis though remains clinically stable without evidence of disease progression) who initially underwent a technically successful repair of infrarenal abdominal aortic aneurysm by Dr. Trula Slade on 06/08/2014.  Patient was initially seen in consultation on 08/02/2016 for evaluation of potential her treatment options for a endoleak, however at that time it was decided to pursue conservative management given lack of significant growth of the native sac of the abdominal aortic aneurysm.  Patient returns today to the interventional radiology clinic following acquisition of dedicated post stent CTA performed 11/08/2017. The patient is accompanied by his wife though again serves as his own historian.  Patient remains without complaint in regards to his abdominal aortic aneurysm. Specifically, no abdominal or flank pain.   Past Medical History:  Diagnosis Date  . AAA (abdominal aortic aneurysm) (HCC)    s/p repair (aortoiliac bipass; with persistent endograft leak in inferior mesenteric artery)--stable as of 01/2017 vascular f/u.  Marland Kitchen Asymptomatic cholelithiasis 07/2015   Incidental finding on PET CT  . Back pain 04/19/2016  . Coronary artery disease    MI 1992, S/P  PTCA; negative stress test in November 2011 with no ischemia.   . Diverticulosis   . Hearing loss    Bilateral   . History of radiation therapy 11/10/13-12/12/13   lung,50Gy/72fx  . Hyperlipidemia    Crestor= myalgias  . Hypertension   . Lung cancer ([place]) 05/2013   non-small cell;  L upper lobectomy with mediastinal LN dissection, chemo, and radiation in 2014/2015. Remission until pt  had questionable seizure 07/2015--MRI showed brain mets; palliative brain rad (stereotactic radiation therapy) started 08/25/15.  CT C/A/P clear 02/2016, 05/2016, and 11/2016.  Plan per onc is to repeat in 6 mo.  . Malignant neoplasm of upper lobe, left bronchus or lung ([place]) 10/29/2013   ?New spiculated 4.5cm mass in L upper lung field on CXR at [facility] in [place] 07/14/17.  Dr. Julien Nordmann (onc) recommended f/u CXR after abx course.  If mass unchanged then repeat CT chest.  . Malignant neoplasm of upper lobe, left bronchus or lung ([place]) 06/26/2013  . Myocardial infarction [facility]) 1992   Dr Angelena Form    . Peripheral vascular disease ([place]) 08/2012   4.8x4.6 cm infrarenal abdominal aortic fusiform aneurysm, 1.5 cm right common iliac artery aneurysm.  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  . S/P radiation therapy [facility] 08/25/15   Stereotactic radiation therapy: frontoparietal 18gy,posterior frontal lobe 20gy,  . Seizure disorder ([place]) 2016/2017   as sequela of brain mets;  Grand mal seizure 07/2015, then got on keppra and was seizure-free until focal motor seizures of L side of face began 02/2016- these responded well to up-titration of keppra but pt had adverse side effects so eventually pt had to be switched over to lamictal 07/2016.  Marland Kitchen Shortness of breath   . Tobacco dependence    "Quit" 1992, but pt has smoked "on and off" since that time    Past Surgical History:  Procedure Laterality Date  . ABDOMINAL AORTIC ENDOVASCULAR STENT GRAFT N/A 05/07/2014  Procedure: ABDOMINAL AORTIC ENDOVASCULAR STENT GRAFT;  Surgeon: Serafina Mitchell, MD;  Location: [facility] OR;  Service: Vascular;  Laterality: N/A;  . Carotid duplex dopplers  07/2015   1-39% on R, no signif dz noted on L  . COLONOSCOPY W/ POLYPECTOMY  2003    negative 2010,due 2020; Dr Olevia Perches  . CORONARY ANGIOPLASTY     no stents  .  CYSTOSCOPY/RETROGRADE/URETEROSCOPY Bilateral 10/09/2012   Procedure: BILATERAL RETROGRADE bladder and urethral BIOPSY ;  Surgeon: Molli [place], MD;  Location: [facility] ORS;  Service: Urology;  Laterality: Bilateral;  BILATERAL RETROGRADE   . EEG  08/05/15   Pt placed on keppra just prior to this test due to having ? seizure (MRI showed brain mets)  . HERNIA REPAIR Bilateral    Inguinal  . INGUINAL HERIIORRHAPHY BILATERALLY    . IR GENERIC HISTORICAL  07/20/2016   IR RADIOLOGIST EVAL & MGMT 07/20/2016 [facility] INTERV RAD  . IR GENERIC HISTORICAL  08/02/2016   IR RADIOLOGIST EVAL & MGMT 08/02/2016 Sandi Mariscal, MD [facility] INTERV RAD  . IR RADIOLOGIST EVAL & MGMT  11/08/2017  . MEDIASTINOSCOPY N/A 05/01/2013   Procedure: MEDIASTINOSCOPY;  Surgeon: Gaye Pollack, MD;  Location: [facility] OR;  Service: Thoracic;  Laterality: N/A;  . PFTs  04/2013   Minimal obstructive airway disease  . PILONIDAL CYST EXCISION    . PROSTATE BIOPSY N/A 10/09/2012   Procedure: PROSTATIC URETHRAL BIOPSY;  Surgeon: Molli [place], MD;  Location: [facility];  Service: Urology;  Laterality: N/A;  PROSTATIC URETHRAL BIOPSY   . PTCA  1992  . ROTATOR CUFF REPAIR     Bilateral  . THOROCOTOMY WITH LOBECTOMY Left 05/29/2013   Procedure: LEFT THOROCOTOMY WITH LEFT UPPER LOBE LOBECTOMY;  Surgeon: Gaye Pollack, MD;  Location: [place];  Service: Thoracic;  Laterality: Left;  Marland Kitchen VIDEO BRONCHOSCOPY N/A 05/01/2013   Procedure: VIDEO BRONCHOSCOPY;  Surgeon: Gaye Pollack, MD;  Location: [facility] OR;  Service: Thoracic;  Laterality: N/A;    Allergies: Fluvirin [influenza vac split quad]; Rosuvastatin; and Iodinated diagnostic agents  Medications: Prior to Admission medications   Medication Sig Start Date End Date Taking? Authorizing Provider  amLODipine (NORVASC) 5 MG tablet TAKE 1 TABLET BY MOUTH EVERY DAY 09/07/17   McGowen, Adrian Blackwater, MD  aspirin EC 81 MG tablet Take 81 mg by mouth daily.    [provider]  atorvastatin  (LIPITOR) 20 MG tablet Take 1 tablet (20 mg total) by mouth daily. Needs appointment for further refills. 09/17/17   McGowen, Adrian Blackwater, MD  budesonide-formoterol (SYMBICORT) 160-4.5 MCG/ACT inhaler INHALE 1-2 PUFFS INTO THE LINGS EVERY 12 HOURS. GARGLE AND SPIT AFTER USE. 08/07/17   McGowen, Adrian Blackwater, MD  diphenhydrAMINE (BENADRYL) 50 MG tablet Take 1 tablet (50 mg total) by mouth as directed. Take 1 tablet (50 mg) by mouth 1 hr prior to CTA (scheduled for 11/08/2017) 10/23/17   Sandi Mariscal, MD  Glucosamine-Chondroit-Vit C-Mn (GLUCOSAMINE 1500 COMPLEX PO) Take 1,500 mg by mouth daily.     [provider]  lamoTRIgine (LAMICTAL) 200 MG tablet TAKE 1 TABLET BY MOUTH IN THE MORNING. TAKE 1.5 TABLETS BY MOUTH IN THE EVENING 08/02/17   Cameron Sprang, MD  metoprolol tartrate (LOPRESSOR) 50 MG tablet TAKE 1 TABLET BY MOUTH TWICE A DAY 09/19/17   McGowen, Adrian Blackwater, MD  Multiple Vitamin (MULTIVITAMIN) tablet Take 1 tablet by mouth every evening.     [provider]  pentoxifylline (TRENTAL) 400 MG CR tablet Take  1 tablet (400 mg total) by mouth 3 (three) times daily. 12/06/15   Hayden Pedro, PA-C  predniSONE (DELTASONE) 50 MG tablet Take 1 tablet (50 mg total) by mouth as directed. Take Prednisone 50 mg by mouth 13 hrs, 7 hrs and 1 hr prior to CTA (scheduled 11/08/2017). 11/07/17   Sandi Mariscal, MD  vitamin E 1000 UNIT capsule Take 1 capsule (1,000 Units total) by mouth daily. 12/06/15   Hayden Pedro, PA-C     Family History  Problem Relation Age of Onset  . Hypertension Mother   . Prostate cancer Father        Prostate cancer  . Cancer Father        Brain Tumor  . Lung cancer Sister        NON SMOKER  . Cancer Sister   . Hyperlipidemia Sister   . Hyperlipidemia Brother   . Heart attack Brother   . Hypertension Brother   . Diabetes Neg Hx   . Stroke Neg Hx     Social History   Socioeconomic History  . Marital status: Married    Spouse name: Not on file  .  Number of children: Not on file  . Years of education: Not on file  . Highest education level: Not on file  Occupational History  . Occupation: Retired Scientist, clinical (histocompatibility and immunogenetics)  . Financial resource strain: Not on file  . Food insecurity:    Worry: Not on file    Inability: Not on file  . Transportation needs:    Medical: Not on file    Non-medical: Not on file  Tobacco Use  . Smoking status: Current Every Day Smoker    Packs/day: 1.00    Years: 24.00    Pack years: 24.00    Types: Cigarettes    Last attempt to quit: 05/28/2013    Years since quitting: 4.4  . Smokeless tobacco: Never Used  . Tobacco comment: QUIT 15 YEARS AGO-07/03/17 smoking  Substance and Sexual Activity  . Alcohol use: Yes    Alcohol/week: 9.0 oz    Types: 3 Glasses of wine, 12 Cans of beer per week    Comment: 1-2 beers a day  . Drug use: No  . Sexual activity: Not on file  Lifestyle  . Physical activity:    Days per week: Not on file    Minutes per session: Not on file  . Stress: Not on file  Relationships  . Social connections:    Talks on phone: Not on file    Gets together: Not on file    Attends religious service: Not on file    Active member of club or organization: Not on file    Attends meetings of clubs or organizations: Not on file    Relationship status: Not on file  Other Topics Concern  . Not on file  Social History Narrative   HEART HEALTHY DIET.     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   MARRIED, 2 children who live in [place].  Smoked on and off since then.   ALCOHOL USE -YES- RED WINE SOCIALLY, couple of beers at night usually.    ECOG Status: 0 - Asymptomatic  Review of Systems: A 12 point ROS discussed and pertinent positives are indicated in the HPI above.  All other systems are negative.  Review of Systems  Vital Signs: BP 131/87   Pulse 82   Temp 97.7 F (36.5 C) (Oral)  Resp 15   Ht 5\' 3"  (1.6 m)   Wt 145 lb (65.8 kg)   SpO2 97%   BMI  25.69 kg/m   Physical Exam  Mallampati Score:     Imaging: Ir Radiologist Eval & Mgmt  Result Date: 11/08/2017 Please refer to notes tab for details about interventional procedure. (Op Note)  Ct Angio Abd/pel W/ And/or W/o  Result Date: 11/08/2017 CLINICAL DATA:  Follow-up endoleak EXAM: CTA ABDOMEN AND PELVIS wITHOUT AND WITH CONTRAST TECHNIQUE: Multidetector CT imaging of the abdomen and pelvis was performed using the standard protocol during bolus administration of intravenous contrast. Multiplanar reconstructed images and MIPs were obtained and reviewed to evaluate the vascular anatomy. CONTRAST:  164mL ISOVUE-370 IOPAMIDOL (ISOVUE-370) INJECTION 76% COMPARISON:  None. FINDINGS: VASCULAR Aorta: Aorto bi-iliac stent graft components are stable in position. Type 2 endoleak is stable. Maximal aneurysm sac diameter is 5.8 cm compare with 5.7 cm on the prior study. This is not significantly changed. The type 2 endoleak is secondary to the IMA with collateral flow from the middle colic artery. Potential outflow branches included accessory right lower pole renal artery and bilateral low lumbar arteries. These are marked with arrows. There is a second area of endoleak anterior to the upper aortic stent graft component, just below the neck. Above the area of contrast enhancement, the graft is well opposed to the aortic wall neck. See image 92 of series 12 and image 51 of series 6. This most likely represents extension of the type 2 endoleak superiorly. Celiac: Patent.  Branch vessels patent. SMA: Patent.  Branch vessels patent. Renals: Dominant right upper renal artery is patent. Single left renal artery is patent. Accessory right lower pole renal artery is patent likely due to retrograde flow. There is poor perfusion in the lower pole segment of the right kidney. IMA: IMA is patent and is the likely cause of the type 2 endoleak. Inflow: Bilateral common iliac stent grafts are patent. Distal right common  iliac artery is aneurysmal with a maximal diameter of 2.2 cm compared with 2.1 cm based on my direct measurements. The right internal and external iliac artery are patent and moderately disease with atherosclerotic calcification. Distal left common iliac artery is aneurysmal with a maximal diameter of 1.5 cm. This is stable based on my direct measurements. Left internal and external iliac arteries are patent with atherosclerotic calcifications. Proximal Outflow: Grossly patent. Veins: No evidence of DVT. Review of the MIP images confirms the above findings. NON-VASCULAR Lower chest: No mass.  Left hemidiaphragm is elevated. Hepatobiliary: Liver is unremarkable.  Single gallstone is noted. Pancreas: Unremarkable Spleen: Unremarkable Adrenals/Urinary Tract: Adrenal glands are unremarkable. Several renal cysts are present. Atrophy in the lower pole of the right kidney is noted. Mild diffuse bladder wall thickening is nonspecific and grossly unchanged. Stomach/Bowel: No obvious mass in the colon. Prominent stool burden. Sigmoid diverticulosis. Small bowel loops are anterior to the liver dome. No evidence of small-bowel obstruction. Lymphatic: No abnormal retroperitoneal adenopathy. Reproductive: Prostate is markedly enlarged. Other: No free fluid. Musculoskeletal: L4 and L5 lumbar compression deformities are stable. No new compression fracture. IMPRESSION: VASCULAR Aorto bi-iliac stent graft is again noted. Maximal AP sac diameter is 5.8 cm compared with 5.7 cm on the prior study. This is not significantly changed. The type 2 endoleak secondary to the IMA is stable. See above for details. NON-VASCULAR Cholelithiasis. Right lower pole renal segment atrophy. Prostate enlargement. Electronically Signed   By: Marybelle Killings M.D.   On: 11/08/2017  09:41    Labs:  CBC: Recent Labs    12/12/16 1536 06/18/17 1006  WBC 8.7 7.1  HGB 15.7 15.7  HCT 46.3 46.3  PLT 232 243    COAGS: No results for input(s): INR, APTT  in the last 8760 hours.  BMP: Recent Labs    12/12/16 1536 06/18/17 1006 11/08/17 0834  NA 141 141  --   K 4.4 5.3*  --   CO2 27 25  --   GLUCOSE 103 146*  --   BUN 16.0 14.3  --   CALCIUM 10.4 10.3  --   CREATININE 1.1 0.9 1.00    LIVER FUNCTION TESTS: Recent Labs    12/12/16 1536 06/18/17 1006  BILITOT 0.54 0.35  AST 15 15  ALT 16 17  ALKPHOS 64 63  PROT 7.7 7.6  ALBUMIN 4.2 4.0    TUMOR MARKERS: No results for input(s): AFPTM, CEA, CA199, CHROMGRNA in the last 8760 hours.  Assessment and Plan:  Jordan Mccullough is a 77 y.o. male with complex past medical history significant for smoking, hypertension, hyperlipidemia, CAD and lung cancer (with brain metastasis though remains clinically stable without evidence of disease progression) who initially underwent a technically successful repair of infrarenal abdominal aortic aneurysm by Dr. Trula Slade on 06/08/2014.  Patient was initially seen in consultation on 08/02/2016 for evaluation of potential her treatment options for a endoleak, however at that time it was decided to pursue conservative management given lack of significant growth of the native sac of the abdominal aortic aneurysm.  Personal review of indicated post stent repair CTA performed 11/18/2017 demonstrates slight increase in size of excluded native abdominal aortic aneurysm sac, currently measuring 5.3 x 5.6 x 5.6 cm as measured in greatest oblique short axis axial (image 61, series 6), coronal (image 63, series 12) and sagital (image 82, series 10) dimensions, previously, 5.2 x 5.2 x 5.6 cm when compared to the 07/28/2016 examination and approximately 4.9 x 5.2 x 5.3 cm when compared to the 06/08/2014 examination.  The complex endoleak is again favored to be predominantly classified as at type II with supply by a combination of the IMA, a tiny accessory right renal artery as well as bilateral L3 lumbar arteries. Additionally, there is persistent contrast opacification  about the cranial aspect of the stent graft and while the stent graft remains well opposed against the walls of the infrarenal aorta, it is uncertain whether this represents a type IA/III endoleak versus cranial supply to the native aneurysm sac via the type II endoleak.  Given continued (albeit) slow growth of the native abdominal aortic sac, I feel treatment is now warranted.  Risks and benefits of continued observation and associated risk of rupture versus treatment was discussed in great deal detail with the patient and the patient's wife who are in agreement that treatment is now warranted.   As such, I will initially perform a diagnostic mesenteric arteriogram arteriogram to better evaluate the complex endoleak.  While review of the CTA performed early today demonstrates a pathway between the SMA and IMA via the middle colic artery (origin best seen on coronal image 63, series 9), I am uncertain whether the IMA will be amenable to coil embolization given the markedly tortuosity and length of the middle colic artery.  If the endoleak is not amenable to catheter directed treatment, I will see the patient repeat consultation to further discuss the risks and benefits of pursuing endoleak treatment with a direct aortic stick. (note, this will  be performed at a different date at this procedure requires general anesthesia.)  Above conversation was held in great detail with the patient and the patient's wife demonstrated good understanding of the above discussion.  The mesenteric arteriogram and potential intervention will be performed at [facility] with conscious sedation on an outpatient basis.  Note, patient has history of contrast allergy and will require a 13 hour prep prior to undergoing the arteriogram.  Thank you for this interesting consult.  I greatly enjoyed meeting Jordan Mccullough and look forward to participating in their care.  A copy of this report was sent to the requesting  provider on this date.  Electronically Signed: Sandi Mariscal 11/08/2017, 5:53 PM   I spent a total of 25 Minutes in face to face in clinical consultation, greater than 50% of which was counseling/coordinating care for endoleak evaluation and management

## 2017-11-12 ENCOUNTER — Encounter: Payer: Self-pay | Admitting: *Deleted

## 2017-11-12 ENCOUNTER — Encounter: Payer: Self-pay | Admitting: Radiation Oncology

## 2017-11-12 NOTE — Progress Notes (Signed)
1020 Called in refill 3 times 90 tablets to [facility] at [phone] for Trental 400 mg tid po  per Shona [place], PA-C the [facility] will notify Mr. Rodriques when the medication is ready for pick up.

## 2017-11-16 ENCOUNTER — Encounter: Payer: Self-pay | Admitting: Surgery

## 2017-11-18 ENCOUNTER — Other Ambulatory Visit: Payer: Self-pay | Admitting: Neurology

## 2017-11-21 ENCOUNTER — Telehealth (HOSPITAL_COMMUNITY): Payer: Self-pay

## 2017-11-21 NOTE — Telephone Encounter (Signed)
Called to schedule arteriogram, no answer, left vm. AW

## 2017-11-26 ENCOUNTER — Other Ambulatory Visit (HOSPITAL_COMMUNITY): Payer: Self-pay | Admitting: Interventional Radiology

## 2017-11-26 DIAGNOSIS — T82330D Leakage of aortic (bifurcation) graft (replacement), subsequent encounter: Secondary | ICD-10-CM

## 2017-11-26 DIAGNOSIS — IMO0001 Reserved for inherently not codable concepts without codable children: Secondary | ICD-10-CM

## 2017-11-27 ENCOUNTER — Telehealth: Payer: Self-pay

## 2017-11-27 NOTE — Telephone Encounter (Signed)
Patient called to say he has not received his 13-hr prep prescription we were to send him for his IR procedure 12/03/17 @ 0900.  I [facility]oned this [facility] in [facility] [place] and also let him know to take Prednisone 50mg  PO 12/02/17 @ 2000, 12/03/17 @ 0200 and 0800.  He knows to take Benadryl 50mg  PO 12/03/17 @ 0800, as well.  Brita Romp, RN

## 2017-11-28 ENCOUNTER — Telehealth: Payer: Self-pay | Admitting: Student

## 2017-11-28 NOTE — Telephone Encounter (Signed)
Received message from patient regarding pre-procedural dye allergy prep. Patient scheduled for image-guided mesenteric angiogram with Dr. Pascal Lux 12/03/2017.  Asked patient if he had questions regarding his medication. Patient states that a nurse called him yesterday to inform him that his prescriptions were [facility]oned [facility]. States that he understands how to take his medications and has no other questions.  All questions answered and concerns addressed. Patient conveys understanding and agrees with plan.  Bea Graff Kristalyn Bergstresser, PA-C 11/28/2017, 11:02 AM

## 2017-11-30 ENCOUNTER — Other Ambulatory Visit: Payer: Self-pay | Admitting: Radiology

## 2017-12-03 ENCOUNTER — Ambulatory Visit (HOSPITAL_COMMUNITY)
Admission: RE | Admit: 2017-12-03 | Discharge: 2017-12-03 | Disposition: A | Discharge status: Home / Self Care | Payer: Medicare Other | Source: Ambulatory Visit | Attending: Interventional Radiology | Admitting: Interventional Radiology

## 2017-12-03 ENCOUNTER — Other Ambulatory Visit (HOSPITAL_COMMUNITY): Payer: Self-pay | Admitting: Interventional Radiology

## 2017-12-03 ENCOUNTER — Encounter (HOSPITAL_COMMUNITY): Payer: Self-pay

## 2017-12-03 DIAGNOSIS — T82330D Leakage of aortic (bifurcation) graft (replacement), subsequent encounter: Secondary | ICD-10-CM

## 2017-12-03 DIAGNOSIS — Z7982 Long term (current) use of aspirin: Secondary | ICD-10-CM | POA: Insufficient documentation

## 2017-12-03 DIAGNOSIS — Z87891 Personal history of nicotine dependence: Secondary | ICD-10-CM | POA: Insufficient documentation

## 2017-12-03 DIAGNOSIS — I252 Old myocardial infarction: Secondary | ICD-10-CM | POA: Diagnosis not present

## 2017-12-03 DIAGNOSIS — I739 Peripheral vascular disease, unspecified: Secondary | ICD-10-CM | POA: Insufficient documentation

## 2017-12-03 DIAGNOSIS — E785 Hyperlipidemia, unspecified: Secondary | ICD-10-CM | POA: Insufficient documentation

## 2017-12-03 DIAGNOSIS — Y832 Surgical operation with anastomosis, bypass or graft as the cause of abnormal reaction of the patient, or of later complication, without mention of misadventure at the time of the procedure: Secondary | ICD-10-CM | POA: Diagnosis not present

## 2017-12-03 DIAGNOSIS — IMO0001 Reserved for inherently not codable concepts without codable children: Secondary | ICD-10-CM

## 2017-12-03 DIAGNOSIS — I2510 Atherosclerotic heart disease of native coronary artery without angina pectoris: Secondary | ICD-10-CM | POA: Diagnosis not present

## 2017-12-03 DIAGNOSIS — Z955 Presence of coronary angioplasty implant and graft: Secondary | ICD-10-CM | POA: Diagnosis not present

## 2017-12-03 DIAGNOSIS — G40909 Epilepsy, unspecified, not intractable, without status epilepticus: Secondary | ICD-10-CM | POA: Diagnosis not present

## 2017-12-03 DIAGNOSIS — Z8249 Family history of ischemic heart disease and other diseases of the circulatory system: Secondary | ICD-10-CM | POA: Insufficient documentation

## 2017-12-03 DIAGNOSIS — I10 Essential (primary) hypertension: Secondary | ICD-10-CM | POA: Insufficient documentation

## 2017-12-03 DIAGNOSIS — Z7951 Long term (current) use of inhaled steroids: Secondary | ICD-10-CM | POA: Insufficient documentation

## 2017-12-03 DIAGNOSIS — T82330A Leakage of aortic (bifurcation) graft (replacement), initial encounter: Secondary | ICD-10-CM | POA: Insufficient documentation

## 2017-12-03 DIAGNOSIS — I713 Abdominal aortic aneurysm, ruptured: Secondary | ICD-10-CM | POA: Diagnosis not present

## 2017-12-03 HISTORY — PX: IR ANGIOGRAM SELECTIVE EACH ADDITIONAL VESSEL: IMG667

## 2017-12-03 HISTORY — PX: IR ANGIOGRAM VISCERAL SELECTIVE: IMG657

## 2017-12-03 HISTORY — PX: IR EMBO ARTERIAL NOT HEMORR HEMANG INC GUIDE ROADMAPPING: IMG5448

## 2017-12-03 HISTORY — PX: IR US GUIDE VASC ACCESS RIGHT: IMG2390

## 2017-12-03 LAB — BASIC METABOLIC PANEL
Anion gap: 12 (ref 5–15)
BUN: 18 mg/dL (ref 6–20)
CO2: 20 mmol/L — ABNORMAL LOW (ref 22–32)
Calcium: 9.8 mg/dL (ref 8.9–10.3)
Chloride: 107 mmol/L (ref 101–111)
Creatinine, Ser: 1.03 mg/dL (ref 0.61–1.24)
GFR calc Af Amer: >60 mL/min (ref >60)
GFR calc non Af Amer: >60 mL/min (ref >60)
Glucose, Bld: 165 mg/dL — ABNORMAL HIGH (ref 65–99)
Potassium: 4.3 mmol/L (ref 3.5–5.1)
Sodium: 139 mmol/L (ref 135–145)

## 2017-12-03 LAB — CBC
HCT: 46.6 % (ref 39.0–52.0)
Hemoglobin: 16.3 g/dL (ref 13.0–17.0)
MCH: 30.2 pg (ref 26.0–34.0)
MCHC: 35 g/dL (ref 30.0–36.0)
MCV: 86.3 fL (ref 78.0–100.0)
Platelets: 279 10*3/uL (ref 150–400)
RBC: 5.4 MIL/uL (ref 4.22–5.81)
RDW: 15.1 % (ref 11.5–15.5)
WBC: 7.5 10*3/uL (ref 4.0–10.5)

## 2017-12-03 LAB — PROTIME-INR
INR: 0.9
Prothrombin Time: 12 seconds (ref 11.4–15.2)

## 2017-12-03 MED ORDER — IOPAMIDOL (ISOVUE-300) INJECTION 61%
INTRAVENOUS | Status: AC
  Filled 2017-12-03: qty 200

## 2017-12-03 MED ORDER — SODIUM CHLORIDE 0.9 % IV SOLN: INTRAVENOUS | Status: DC

## 2017-12-03 MED ORDER — LIDOCAINE HCL (PF) 1 % IJ SOLN
INTRAMUSCULAR | Status: AC | PRN
  Administered 2017-12-03: 5 mL

## 2017-12-03 MED ORDER — FENTANYL CITRATE (PF) 100 MCG/2ML IJ SOLN
INTRAMUSCULAR | Status: AC
  Filled 2017-12-03: qty 4

## 2017-12-03 MED ORDER — IOPAMIDOL (ISOVUE-300) INJECTION 61%
INTRAVENOUS | Status: AC
  Filled 2017-12-03: qty 100

## 2017-12-03 MED ORDER — MIDAZOLAM HCL 2 MG/2ML IJ SOLN
INTRAMUSCULAR | Status: AC | PRN
  Administered 2017-12-03: 1 mg via INTRAVENOUS
  Administered 2017-12-03: 0.5 mg via INTRAVENOUS
  Administered 2017-12-03: 1 mg via INTRAVENOUS
  Administered 2017-12-03: 0.5 mg via INTRAVENOUS

## 2017-12-03 MED ORDER — MIDAZOLAM HCL 2 MG/2ML IJ SOLN
INTRAMUSCULAR | Status: AC
  Filled 2017-12-03: qty 4

## 2017-12-03 MED ORDER — IOPAMIDOL (ISOVUE-300) INJECTION 61%
INTRAVENOUS | Status: AC
  Administered 2017-12-03: 110 mL
  Filled 2017-12-03: qty 100

## 2017-12-03 MED ORDER — LIDOCAINE HCL 1 % IJ SOLN
INTRAMUSCULAR | Status: AC
  Filled 2017-12-03: qty 20

## 2017-12-03 MED ORDER — FENTANYL CITRATE (PF) 100 MCG/2ML IJ SOLN
INTRAMUSCULAR | Status: AC | PRN
  Administered 2017-12-03 (×2): 25 ug via INTRAVENOUS
  Administered 2017-12-03: 50 ug via INTRAVENOUS

## 2017-12-03 NOTE — Sedation Documentation (Signed)
Patient is resting comfortably. 

## 2017-12-03 NOTE — Procedures (Signed)
Pre-procedure Diagnosis: Endoleak post AAA EVAR  Post-procedure Diagnosis: Same  Post mesenteric arteriogram and percutaneous coil and Onyx embolization of the endoleak via SMA - IMA collateral pathway..    Complications: None Immediate  EBL: None  Keep right leg straight for 4 hrs.    SignedSandi Mariscal Pager: [phone] 12/03/2017, 1:08 PM

## 2017-12-03 NOTE — Discharge Instructions (Addendum)
Angiogram, Care After °This sheet gives you information about how to care for yourself after your procedure. Your health care provider may also give you more specific instructions. If you have problems or questions, contact your health care provider. °What can I expect after the procedure? °After the procedure, it is common to have bruising and tenderness at the catheter insertion area. °Follow these instructions at home: °Insertion site care °· Follow instructions from your health care provider about how to take care of your insertion site. Make sure you: °? Wash your hands with soap and water before you change your bandage (dressing). If soap and water are not available, use hand sanitizer. °? Change your dressing as told by your health care provider. °? Leave stitches (sutures), skin glue, or adhesive strips in place. These skin closures may need to stay in place for 2 weeks or longer. If adhesive strip edges start to loosen and curl up, you may trim the loose edges. Do not remove adhesive strips completely unless your health care provider tells you to do that. °· Do not take baths, swim, or use a hot tub until your health care provider approves. °· You may shower 24-48 hours after the procedure or as told by your health care provider. °? Gently wash the site with plain soap and water. °? Pat the area dry with a clean towel. °? Do not rub the site. This may cause bleeding. °· Do not apply powder or lotion to the site. Keep the site clean and dry. °· Check your insertion site every day for signs of infection. Check for: °? Redness, swelling, or pain. °? Fluid or blood. °? Warmth. °? Pus or a bad smell. °Activity °· Rest as told by your health care provider, usually for 1-2 days. °· Do not lift anything that is heavier than 10 lbs. (4.5 kg) or as told by your health care provider. °· Do not drive for 24 hours if you were given a medicine to help you relax (sedative). °· Do not drive or use heavy machinery while  taking prescription pain medicine. °General instructions °· Return to your normal activities as told by your health care provider, usually in about a week. Ask your health care provider what activities are safe for you. °· If the catheter site starts bleeding, lie flat and put pressure on the site. If the bleeding does not stop, get help right away. This is a medical emergency. °· Drink enough fluid to keep your urine clear or pale yellow. This helps flush the contrast dye from your body. °· Take over-the-counter and prescription medicines only as told by your health care provider. °· Keep all follow-up visits as told by your health care provider. This is important. °Contact a health care provider if: °· You have a fever or chills. °· You have redness, swelling, or pain around your insertion site. °· You have fluid or blood coming from your insertion site. °· The insertion site feels warm to the touch. °· You have pus or a bad smell coming from your insertion site. °· You have bruising around the insertion site. °· You notice blood collecting in the tissue around the catheter site (hematoma). The hematoma may be painful to the touch. °Get help right away if: °· You have severe pain at the catheter insertion area. °· The catheter insertion area swells very fast. °· The catheter insertion area is bleeding, and the bleeding does not stop when you hold steady pressure on the area. °·   The area near or just beyond the catheter insertion site becomes pale, cool, tingly, or numb. These symptoms may represent a serious problem that is an emergency. Do not wait to see if the symptoms will go away. Get medical help right away. Call your local emergency services (911 in the [place] Do not drive yourself to the hospital. Summary  After the procedure, it is common to have bruising and tenderness at the catheter insertion area.  After the procedure, it is important to rest and drink plenty of fluids.  Do not take baths,  swim, or use a hot tub until your health care provider says it is okay to do so. You may shower 24-48 hours after the procedure or as told by your health care provider.  If the catheter site starts bleeding, lie flat and put pressure on the site. If the bleeding does not stop, get help right away. This is a medical emergency. This information is not intended to replace advice given to you by your health care provider. Make sure you discuss any questions you have with your health care provider. Document Released: 02/23/2005 Document Revised: 07/12/2016 Document Reviewed: 07/12/2016 Elsevier Interactive Patient Education  2018 [place]. Moderate Conscious Sedation, Adult, Care After These instructions provide you with information about caring for yourself after your procedure. Your health care provider may also give you more specific instructions. Your treatment has been planned according to current medical practices, but problems sometimes occur. Call your health care provider if you have any problems or questions after your procedure. What can I expect after the procedure? After your procedure, it is common:  To feel sleepy for several hours.  To feel clumsy and have poor balance for several hours.  To have poor judgment for several hours.  To vomit if you eat too soon.  Follow these instructions at home: For at least 24 hours after the procedure:   Do not: ? Participate in activities where you could fall or become injured. ? Drive. ? Use heavy machinery. ? Drink alcohol. ? Take sleeping pills or medicines that cause drowsiness. ? Make important decisions or sign legal documents. ? Take care of children on your own.  Rest. Eating and drinking  Follow the diet recommended by your health care provider.  If you vomit: ? Drink water, juice, or soup when you can drink without vomiting. ? Make sure you have little or no nausea before eating solid foods. General  instructions  Have a responsible adult stay with you until you are awake and alert.  Take over-the-counter and prescription medicines only as told by your health care provider.  If you smoke, do not smoke without supervision.  Keep all follow-up visits as told by your health care provider. This is important. Contact a health care provider if:  You keep feeling nauseous or you keep vomiting.  You feel light-headed.  You develop a rash.  You have a fever. Get help right away if:  You have trouble breathing. This information is not intended to replace advice given to you by your health care provider. Make sure you discuss any questions you have with your health care provider. Document Released: 05/28/2013 Document Revised: 01/10/2016 Document Reviewed: 11/27/2015 Else[employer] Interactive Patient Education  [employer].

## 2017-12-03 NOTE — Sedation Documentation (Addendum)
Patient is resting comfortably. 

## 2017-12-03 NOTE — H&P (Signed)
Chief Complaint: Patient was seen in consultation today for mesenteric arteriogram with possible intervention/endoleak repair at the request of Dr Orvan Falconer    Supervising Physician: Sandi Mariscal  Patient Status: [facility] - Out-pt  History of Present Illness: Jordan Mccullough is a 77 y.o. male   Dr Pascal Lux note 11/08/17: Complex past medical history significant for smoking, hypertension, hyperlipidemia, CAD and lung cancer (with brain metastasis though remains clinically stable without evidence of disease progression) who initially underwent a technically successful repair of infrarenal abdominal aortic aneurysm by Dr. Trula Slade on 06/08/2014. Patient was initially seen in consultation on 08/02/2016 for evaluation of potential her treatment options for a endoleak, however at that time it was decided to pursue conservative management given lack of significant growth of the native sac of the abdominal aortic aneurysm. Personal review of indicated post stent repair CTA performed 11/18/2017 demonstrates slight increase in size of excluded native abdominal aortic aneurysm sac, currently measuring 5.3 x 5.6 x 5.6 cm as measured in greatest oblique short axis axial (image 61, series 6), coronal (image 63, series 12) and sagital (image 82, series 10) dimensions, previously, 5.2 x 5.2 x 5.6 cm when compared to the 07/28/2016 examination and approximately 4.9 x 5.2 x 5.3 cm when compared to the 06/08/2014 examination. The complex endoleak is again favored to be predominantly classified as at type II with supply by a combination of the IMA, a tiny accessory right renal artery as well as bilateral L3 lumbar arteries. Additionally, there is persistent contrast opacification about the cranial aspect of the stent graft and while the stent graft remains well opposed against the walls of the infrarenal aorta, it is uncertain whether this represents a type IA/III endoleak versus cranial supply to the native aneurysm sac via  the type II endoleak. Given continued (albeit) slow growth of the native abdominal aortic sac, I feel treatment is now warranted. Risks and benefits of continued observation and associated risk of rupture versus treatment was discussed in  great deal detail with the patient and the patient's wife who are in agreement that treatment is now warranted.   As such, I will initially perform a diagnostic mesenteric arteriogram arteriogram to better evaluate the complex endoleak.  While review of the CTA performed early today demonstrates a pathway between the SMA and IMA via the middle colic artery (origin best seen on coronal image 63, series 9), I am uncertain whether the IMA will be amenable to coil embolization given the markedly tortuosity and length of the middle colic artery.  If the endoleak is not amenable to catheter directed treatment, I will see the patient repeat consultation to further discuss the risks and benefits of pursuing endoleak treatment with a direct aortic stick. (note, this will be performed at a different date at this procedure requires general anesthesia.) Above conversation was held in great detail with the patient and the patient's wife demonstrated good understanding of the above discussion. The mesenteric arteriogram and potential intervention will be performed at [facility] with conscious sedation on an outpatient basis  Pt now scheduled for procedure today Has pre medicated with Benadryl and Prednisone    Past Medical History:  Diagnosis Date  . AAA (abdominal aortic aneurysm) (HCC)    s/p repair (aortoiliac bipass; with persistent endograft leak in inferior mesenteric artery)--stable as of 01/2017 vascular f/u.  Marland Kitchen Asymptomatic cholelithiasis 07/2015   Incidental finding on PET CT  . Back pain 04/19/2016  . Coronary artery disease  MI 1992, S/P  PTCA; negative stress test in November 2011 with no ischemia.   . Diverticulosis   . Hearing loss     Bilateral   . History of radiation therapy 11/10/13-12/12/13   lung,50Gy/42fx  . Hyperlipidemia    Crestor= myalgias  . Hypertension   . Lung cancer ([place]) 05/2013   non-small cell;  L upper lobectomy with mediastinal LN dissection, chemo, and radiation in 2014/2015. Remission until pt had questionable seizure 07/2015--MRI showed brain mets; palliative brain rad (stereotactic radiation therapy) started 08/25/15.  CT C/A/P clear 02/2016, 05/2016, and 11/2016.  Plan per onc is to repeat in 6 mo.  . Malignant neoplasm of upper lobe, left bronchus or lung ([place]) 10/29/2013   ?New spiculated 4.5cm mass in L upper lung field on CXR at [facility] in [place] 07/14/17.  Dr. Julien Nordmann (onc) recommended f/u CXR after abx course.  If mass unchanged then repeat CT chest.  . Malignant neoplasm of upper lobe, left bronchus or lung ([place]) 06/26/2013  . Myocardial infarction [facility]) 1992   Dr Angelena Form    . Peripheral vascular disease ([place]) 08/2012   4.8x4.6 cm infrarenal abdominal aortic fusiform aneurysm, 1.5 cm right common iliac artery aneurysm.  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  . S/P radiation therapy [facility] 08/25/15   Stereotactic radiation therapy: frontoparietal 18gy,posterior frontal lobe 20gy,  . Seizure disorder ([place]) 2016/2017   as sequela of brain mets;  Grand mal seizure 07/2015, then got on keppra and was seizure-free until focal motor seizures of L side of face began 02/2016- these responded well to up-titration of keppra but pt had adverse side effects so eventually pt had to be switched over to lamictal 07/2016.  Marland Kitchen Shortness of breath   . Tobacco dependence    "Quit" 1992, but pt has smoked "on and off" since that time    Past Surgical History:  Procedure Laterality Date  . ABDOMINAL AORTIC ENDOVASCULAR STENT GRAFT N/A 05/07/2014   Procedure: ABDOMINAL AORTIC ENDOVASCULAR STENT GRAFT;   Surgeon: Serafina Mitchell, MD;  Location: [facility] OR;  Service: Vascular;  Laterality: N/A;  . Carotid duplex dopplers  07/2015   1-39% on R, no signif dz noted on L  . COLONOSCOPY W/ POLYPECTOMY  2003    negative 2010,due 2020; Dr Olevia Perches  . CORONARY ANGIOPLASTY     no stents  . CYSTOSCOPY/RETROGRADE/URETEROSCOPY Bilateral 10/09/2012   Procedure: BILATERAL RETROGRADE bladder and urethral BIOPSY ;  Surgeon: Molli Hazard, MD;  Location: [facility];  Service: Urology;  Laterality: Bilateral;  BILATERAL RETROGRADE   . EEG  08/05/15   Pt placed on keppra just prior to this test due to having ? seizure (MRI showed brain mets)  . HERNIA REPAIR Bilateral    Inguinal  . INGUINAL HERIIORRHAPHY BILATERALLY    . IR GENERIC HISTORICAL  07/20/2016   IR RADIOLOGIST EVAL & MGMT 07/20/2016 [facility] INTERV RAD  . IR GENERIC HISTORICAL  08/02/2016   IR RADIOLOGIST EVAL & MGMT 08/02/2016 Sandi Mariscal, MD [facility] INTERV RAD  . IR RADIOLOGIST EVAL & MGMT  11/08/2017  . MEDIASTINOSCOPY N/A 05/01/2013   Procedure: MEDIASTINOSCOPY;  Surgeon: Gaye Pollack, MD;  Location: [facility] OR;  Service: Thoracic;  Laterality: N/A;  . PFTs  04/2013   Minimal obstructive airway disease  . PILONIDAL CYST EXCISION    . PROSTATE BIOPSY N/A 10/09/2012   Procedure: PROSTATIC URETHRAL  BIOPSY;  Surgeon: Molli Hazard, MD;  Location: [facility];  Service: Urology;  Laterality: N/A;  PROSTATIC URETHRAL BIOPSY   . PTCA  1992  . ROTATOR CUFF REPAIR     Bilateral  . THOROCOTOMY WITH LOBECTOMY Left 05/29/2013   Procedure: LEFT THOROCOTOMY WITH LEFT UPPER LOBE LOBECTOMY;  Surgeon: Gaye Pollack, MD;  Location: [facility];  Service: Thoracic;  Laterality: Left;  Marland Kitchen VIDEO BRONCHOSCOPY N/A 05/01/2013   Procedure: VIDEO BRONCHOSCOPY;  Surgeon: Gaye Pollack, MD;  Location: [facility] OR;  Service: Thoracic;  Laterality: N/A;    Allergies: Fluvirin [influenza vac split quad]; Rosuvastatin; and Iodinated diagnostic agents  Medications: Prior to Admission  medications   Medication Sig Start Date End Date Taking? Authorizing Provider  amLODipine (NORVASC) 5 MG tablet TAKE 1 TABLET BY MOUTH EVERY DAY 09/07/17  Yes McGowen, Adrian Blackwater, MD  aspirin EC 81 MG tablet Take 81 mg by mouth at bedtime.    Yes [provider]  atorvastatin (LIPITOR) 20 MG tablet Take 1 tablet (20 mg total) by mouth daily. Needs appointment for further refills. 09/17/17  Yes McGowen, Adrian Blackwater, MD  budesonide-formoterol (SYMBICORT) 160-4.5 MCG/ACT inhaler INHALE 1-2 PUFFS INTO THE LINGS EVERY 12 HOURS. GARGLE AND SPIT AFTER USE. Patient taking differently: Inhale 1-2 puffs into the lungs every 12 (twelve) hours as needed (shortness of breath).  08/07/17  Yes McGowen, Adrian Blackwater, MD  lamoTRIgine (LAMICTAL) 200 MG tablet TAKE 1 TABLET BY MOUTH IN THE MORNING. TAKE 1.5 TABLETS BY MOUTH IN THE EVENING Patient taking differently: Take 200 mg by mouth twice daily 11/19/17  Yes Cameron Sprang, MD  metoprolol tartrate (LOPRESSOR) 50 MG tablet TAKE 1 TABLET BY MOUTH TWICE A DAY 09/19/17  Yes McGowen, Adrian Blackwater, MD  Multiple Vitamin (MULTIVITAMIN) tablet Take 1 tablet by mouth daily.    Yes [provider]  pentoxifylline (TRENTAL) 400 MG CR tablet Take 1 tablet (400 mg total) by mouth 3 (three) times daily. 12/06/15  Yes Hayden Pedro, PA-C  vitamin E 1000 UNIT capsule Take 1 capsule (1,000 Units total) by mouth daily. 12/06/15  Yes Hayden Pedro, PA-C  diphenhydrAMINE (BENADRYL) 50 MG tablet Take 1 tablet (50 mg total) by mouth as directed. Take 1 tablet (50 mg) by mouth 1 hr prior to CTA (scheduled for 11/08/2017) Patient not taking: Reported on 11/27/2017 10/23/17   Sandi Mariscal, MD  predniSONE (DELTASONE) 50 MG tablet Take 1 tablet (50 mg total) by mouth as directed. Take Prednisone 50 mg by mouth 13 hrs, 7 hrs and 1 hr prior to CTA (scheduled 11/08/2017). Patient not taking: Reported on 11/27/2017 11/07/17   Sandi Mariscal, MD     Family History  Problem Relation Age of  Onset  . Hypertension Mother   . Prostate cancer Father        Prostate cancer  . Cancer Father        Brain Tumor  . Lung cancer Sister        NON SMOKER  . Cancer Sister   . Hyperlipidemia Sister   . Hyperlipidemia Brother   . Heart attack Brother   . Hypertension Brother   . Diabetes Neg Hx   . Stroke Neg Hx     Social History   Socioeconomic History  . Marital status: Married    Spouse name: Not on file  . Number of children: Not on file  . Years of education: Not on file  . Highest education level: Not on  file  Occupational History  . Occupation: Retired Scientist, clinical (histocompatibility and immunogenetics)  . Financial resource strain: Not on file  . Food insecurity:    Worry: Not on file    Inability: Not on file  . Transportation needs:    Medical: Not on file    Non-medical: Not on file  Tobacco Use  . Smoking status: Current Every Day Smoker    Packs/day: 1.00    Years: 24.00    Pack years: 24.00    Types: Cigarettes    Last attempt to quit: 05/28/2013    Years since quitting: 4.5  . Smokeless tobacco: Never Used  . Tobacco comment: QUIT 15 YEARS AGO-07/03/17 smoking  Substance and Sexual Activity  . Alcohol use: Yes    Alcohol/week: 9.0 oz    Types: 3 Glasses of wine, 12 Cans of beer per week    Comment: 1-2 beers a day  . Drug use: No  . Sexual activity: Not on file  Lifestyle  . Physical activity:    Days per week: Not on file    Minutes per session: Not on file  . Stress: Not on file  Relationships  . Social connections:    Talks on phone: Not on file    Gets together: Not on file    Attends religious service: Not on file    Active member of club or organization: Not on file    Attends meetings of clubs or organizations: Not on file    Relationship status: Not on file  Other Topics Concern  . Not on file  Social History Narrative   HEART HEALTHY DIET.     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   MARRIED, 2 children who live in [place].  Smoked on  and off since then.   ALCOHOL USE -YES- RED WINE SOCIALLY, couple of beers at night usually.    Review of Systems: A 12 point ROS discussed and pertinent positives are indicated in the HPI above.  All other systems are negative.  Review of Systems  Constitutional: Negative for activity change, fatigue and fever.  Respiratory: Negative for cough and shortness of breath.   Cardiovascular: Negative for chest pain.  Gastrointestinal: Negative for abdominal pain.  Musculoskeletal: Negative for gait problem.  Neurological: Negative for weakness.  Psychiatric/Behavioral: Negative for behavioral problems and confusion.    Vital Signs: BP 132/89   Pulse 89   Temp 98.7 F (37.1 C) (Oral)   Resp 16   Ht 5\' 2"  (1.575 m)   Wt 148 lb (67.1 kg)   SpO2 95%   BMI 27.07 kg/m   Physical Exam  Constitutional: He is oriented to person, place, and time.  Cardiovascular: Normal rate, regular rhythm and normal heart sounds.  Pulmonary/Chest: Effort normal.  Abdominal: Soft. Bowel sounds are normal.  Musculoskeletal: Normal range of motion.  Neurological: He is alert and oriented to person, place, and time.  Skin: Skin is warm and dry.  Psychiatric: He has a normal mood and affect. His behavior is normal. Judgment and thought content normal.  Nursing note and vitals reviewed.   Imaging: Ir Radiologist Eval & Mgmt  Result Date: 11/08/2017 Please refer to notes tab for details about interventional procedure. (Op Note)  Ct Angio Abd/pel W/ And/or W/o  Result Date: 11/08/2017 CLINICAL DATA:  Follow-up endoleak EXAM: CTA ABDOMEN AND PELVIS wITHOUT AND WITH CONTRAST TECHNIQUE: Multidetector CT imaging of the abdomen and pelvis was performed using the standard protocol during  bolus administration of intravenous contrast. Multiplanar reconstructed images and MIPs were obtained and reviewed to evaluate the vascular anatomy. CONTRAST:  124mL ISOVUE-370 IOPAMIDOL (ISOVUE-370) INJECTION 76% COMPARISON:   None. FINDINGS: VASCULAR Aorta: Aorto bi-iliac stent graft components are stable in position. Type 2 endoleak is stable. Maximal aneurysm sac diameter is 5.8 cm compare with 5.7 cm on the prior study. This is not significantly changed. The type 2 endoleak is secondary to the IMA with collateral flow from the middle colic artery. Potential outflow branches included accessory right lower pole renal artery and bilateral low lumbar arteries. These are marked with arrows. There is a second area of endoleak anterior to the upper aortic stent graft component, just below the neck. Above the area of contrast enhancement, the graft is well opposed to the aortic wall neck. See image 92 of series 12 and image 51 of series 6. This most likely represents extension of the type 2 endoleak superiorly. Celiac: Patent.  Branch vessels patent. SMA: Patent.  Branch vessels patent. Renals: Dominant right upper renal artery is patent. Single left renal artery is patent. Accessory right lower pole renal artery is patent likely due to retrograde flow. There is poor perfusion in the lower pole segment of the right kidney. IMA: IMA is patent and is the likely cause of the type 2 endoleak. Inflow: Bilateral common iliac stent grafts are patent. Distal right common iliac artery is aneurysmal with a maximal diameter of 2.2 cm compared with 2.1 cm based on my direct measurements. The right internal and external iliac artery are patent and moderately disease with atherosclerotic calcification. Distal left common iliac artery is aneurysmal with a maximal diameter of 1.5 cm. This is stable based on my direct measurements. Left internal and external iliac arteries are patent with atherosclerotic calcifications. Proximal Outflow: Grossly patent. Veins: No evidence of DVT. Review of the MIP images confirms the above findings. NON-VASCULAR Lower chest: No mass.  Left hemidiaphragm is elevated. Hepatobiliary: Liver is unremarkable.  Single gallstone is  noted. Pancreas: Unremarkable Spleen: Unremarkable Adrenals/Urinary Tract: Adrenal glands are unremarkable. Several renal cysts are present. Atrophy in the lower pole of the right kidney is noted. Mild diffuse bladder wall thickening is nonspecific and grossly unchanged. Stomach/Bowel: No obvious mass in the colon. Prominent stool burden. Sigmoid diverticulosis. Small bowel loops are anterior to the liver dome. No evidence of small-bowel obstruction. Lymphatic: No abnormal retroperitoneal adenopathy. Reproductive: Prostate is markedly enlarged. Other: No free fluid. Musculoskeletal: L4 and L5 lumbar compression deformities are stable. No new compression fracture. IMPRESSION: VASCULAR Aorto bi-iliac stent graft is again noted. Maximal AP sac diameter is 5.8 cm compared with 5.7 cm on the prior study. This is not significantly changed. The type 2 endoleak secondary to the IMA is stable. See above for details. NON-VASCULAR Cholelithiasis. Right lower pole renal segment atrophy. Prostate enlargement. Electronically Signed   By: Marybelle Killings M.D.   On: 11/08/2017 09:41    Labs:  CBC: Recent Labs    12/12/16 1536 06/18/17 1006 12/03/17 0745  WBC 8.7 7.1 7.5  HGB 15.7 15.7 16.3  HCT 46.3 46.3 46.6  PLT 232 243 279    COAGS: Recent Labs    12/03/17 0745  INR 0.90    BMP: Recent Labs    12/12/16 1536 06/18/17 1006 11/08/17 0834  NA 141 141  --   K 4.4 5.3*  --   CO2 27 25  --   GLUCOSE 103 146*  --   BUN 16.0 14.3  --  CALCIUM 10.4 10.3  --   CREATININE 1.1 0.9 1.00    LIVER FUNCTION TESTS: Recent Labs    12/12/16 1536 06/18/17 1006  BILITOT 0.54 0.35  AST 15 15  ALT 16 17  ALKPHOS 64 63  PROT 7.7 7.6  ALBUMIN 4.2 4.0    TUMOR MARKERS: No results for input(s): AFPTM, CEA, CA199, CHROMGRNA in the last 8760 hours.  Assessment and Plan:  AAA surgical repair 2015 07/2016 showed endoleak- but conservative management was opted 11/08/2017 CTA reveals enlarging Endoleak Now  scheduled for endovascular repair in IR Risks and benefits of mesenteric arteriogram with possible Endoleak endovascular repair were discussed with the patient including, but not limited to bleeding, infection, vascular injury or contrast induced renal failure.  This interventional procedure involves the use of X-rays and because of the nature of the planned procedure, it is possible that we will have prolonged use of X-ray fluoroscopy.  Potential radiation risks to you include (but are not limited to) the following: - A slightly elevated risk for cancer  several years later in life. This risk is typically less than 0.5% percent. This risk is low in comparison to the normal incidence of human cancer, which is 33% for women and 50% for men according to the [place]. - Radiation induced injury can include skin redness, resembling a rash, tissue breakdown / ulcers and hair loss (which can be temporary or permanent).   The likelihood of either of these occurring depends on the difficulty of the procedure and whether you are sensitive to radiation due to previous procedures, disease, or genetic conditions.   IF your procedure requires a prolonged use of radiation, you will be notified and given written instructions for further action.  It is your responsibility to monitor the irradiated area for the 2 weeks following the procedure and to notify your physician if you are concerned that you have suffered a radiation induced injury.    All of the patient's questions were answered, patient is agreeable to proceed. Consent signed and in chart.  Thank you for this interesting consult.  I greatly enjoyed meeting Jordan Mccullough and look forward to participating in their care.  A copy of this report was sent to the requesting provider on this date.  Electronically Signed: Lavonia Drafts, PA-C 12/03/2017, 8:25 AM   I spent a total of  30 Minutes   in face to face in clinical consultation,  greater than 50% of which was counseling/coordinating care for Mesenteric arteriogram/possible endoleak repair

## 2017-12-03 NOTE — Sedation Documentation (Signed)
Sheath removed, pressure being held 5Fr exoseal closure device used. Marland Kitchen

## 2017-12-04 ENCOUNTER — Other Ambulatory Visit (HOSPITAL_COMMUNITY): Payer: Self-pay | Admitting: Interventional Radiology

## 2017-12-04 ENCOUNTER — Encounter (HOSPITAL_COMMUNITY): Payer: Self-pay | Admitting: Interventional Radiology

## 2017-12-04 DIAGNOSIS — T82330D Leakage of aortic (bifurcation) graft (replacement), subsequent encounter: Secondary | ICD-10-CM

## 2017-12-04 DIAGNOSIS — IMO0001 Reserved for inherently not codable concepts without codable children: Secondary | ICD-10-CM

## 2017-12-04 DIAGNOSIS — IMO0002 Reserved for concepts with insufficient information to code with codable children: Secondary | ICD-10-CM

## 2017-12-04 DIAGNOSIS — T82330A Leakage of aortic (bifurcation) graft (replacement), initial encounter: Secondary | ICD-10-CM

## 2017-12-13 NOTE — Progress Notes (Signed)
Subjective:   Jordan Mccullough is a 77 y.o. male who presents for Medicare Annual/Subsequent preventive examination.  Review of Systems:  No ROS.  Medicare Wellness Visit. Additional risk factors are reflected in the social history.  Cardiac Risk Factors include: advanced age (>34men, >69 women);dyslipidemia;male gender;hypertension;smoking/ tobacco exposure;family history of premature cardiovascular disease   Sleep patterns: Sleeps 6-7 hours, feels rested.  Home Safety/Smoke Alarms: Feels safe in home. Smoke alarms in place.  Living environment; residence and Firearm Safety: Lives with wife in 2 story home.  Seat Belt Safety/Bike Helmet: Wears seat belt.   Male:   CCS-Colonoscopy 03/31/2010, normal. Recall 10 years.       PSA-  Lab Results  Component Value Date   PSA 13.43 (H) 08/13/2013   PSA 6.47 (H) 07/03/2012   PSA 4.43 (H) 08/09/2011       Objective:    Vitals: BP (!) 144/82 (BP Location: Left Arm, Patient Position: Sitting, Cuff Size: Normal)   Pulse 93   Temp 98.3 F (36.8 C) (Oral)   Resp 16   Ht 5\' 2"  (1.575 m)   Wt 145 lb 12.8 oz (66.1 kg)   SpO2 97%   BMI 26.67 kg/m   Body mass index is 26.67 kg/m.  Advanced Directives 12/14/2017 12/03/2017 08/27/2017 07/03/2017 02/14/2017 01/29/2017 12/19/2016  Does Patient Have a Medical Advance Directive? Yes Yes Yes Yes Yes Yes Yes  Type of Paramedic of [place];Living will [place];Living will [place];Living will [place];Living will [place];Living will [place];Living will [place];Living will  Does patient want to make changes to medical advance directive? - No - Patient declined - - - - -  Copy of [place] in Chart? No - copy requested No - copy requested - No - copy requested - No - copy requested No - copy requested  Would patient like information on creating  a medical advance directive? - - - - - - -  Pre-existing out of facility DNR order (yellow form or pink MOST form) - - - - - - -    Tobacco Social History   Tobacco Use  Smoking Status Current Every Day Smoker  . Packs/day: 1.00  . Years: 24.00  . Pack years: 24.00  . Types: Cigarettes  . Last attempt to quit: 05/28/2013  . Years since quitting: 4.5  Smokeless Tobacco Never Used  Tobacco Comment   QUIT 15 YEARS AGO-07/03/17 smoking     Ready to quit: Yes Counseling given: Yes Comment: QUIT 15 YEARS AGO-07/03/17 smoking    Past Medical History:  Diagnosis Date  . AAA (abdominal aortic aneurysm) (HCC)    s/p repair (aortoiliac bipass; with persistent endograft leak in inferior mesenteric artery)--stable as of 01/2017 vascular f/u.  Marland Kitchen Asymptomatic cholelithiasis 07/2015   Incidental finding on PET CT  . Back pain 04/19/2016  . Coronary artery disease    MI 1992, S/P  PTCA; negative stress test in November 2011 with no ischemia.   . Diverticulosis   . Hearing loss    Bilateral   . History of radiation therapy 11/10/13-12/12/13   lung,50Gy/105fx  . Hyperlipidemia    Crestor= myalgias  . Hypertension   . Lung cancer ([place]) 05/2013   non-small cell;  L upper lobectomy with mediastinal LN dissection, chemo, and radiation in 2014/2015. Remission until pt had questionable seizure 07/2015--MRI showed brain mets; palliative brain rad (stereotactic radiation  therapy) started 08/25/15.  CT C/A/P clear 02/2016, 05/2016, and 11/2016.  Plan per onc is to repeat in 6 mo.  . Malignant neoplasm of upper lobe, left bronchus or lung ([place]) 10/29/2013   ?New spiculated 4.5cm mass in L upper lung field on CXR at [facility] in [place] 07/14/17.  Dr. Julien Nordmann (onc) recommended f/u CXR after abx course.  If mass unchanged then repeat CT chest.  . Malignant neoplasm of upper lobe, left bronchus or lung ([place]) 06/26/2013  . Myocardial infarction [facility]) 1992   Dr Angelena Form    . Peripheral vascular  disease ([place]) 08/2012   4.8x4.6 cm infrarenal abdominal aortic fusiform aneurysm, 1.5 cm right common iliac artery aneurysm.  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  . S/P radiation therapy [facility] 08/25/15   Stereotactic radiation therapy: frontoparietal 18gy,posterior frontal lobe 20gy,  . Seizure disorder ([place]) 2016/2017   as sequela of brain mets;  Grand mal seizure 07/2015, then got on keppra and was seizure-free until focal motor seizures of L side of face began 02/2016- these responded well to up-titration of keppra but pt had adverse side effects so eventually pt had to be switched over to lamictal 07/2016.  Marland Kitchen Shortness of breath   . Tobacco dependence    "Quit" 1992, but pt has smoked "on and off" since that time   Past Surgical History:  Procedure Laterality Date  . ABDOMINAL AORTIC ENDOVASCULAR STENT GRAFT N/A 05/07/2014   Procedure: ABDOMINAL AORTIC ENDOVASCULAR STENT GRAFT;  Surgeon: Serafina Mitchell, MD;  Location: [facility] OR;  Service: Vascular;  Laterality: N/A;  . Carotid duplex dopplers  07/2015   1-39% on R, no signif dz noted on L  . COLONOSCOPY W/ POLYPECTOMY  2003    negative 2010,due 2020; Dr Olevia Perches  . CORONARY ANGIOPLASTY     no stents  . CYSTOSCOPY/RETROGRADE/URETEROSCOPY Bilateral 10/09/2012   Procedure: BILATERAL RETROGRADE bladder and urethral BIOPSY ;  Surgeon: Molli Hazard, MD;  Location: [facility];  Service: Urology;  Laterality: Bilateral;  BILATERAL RETROGRADE   . EEG  08/05/15   Pt placed on keppra just prior to this test due to having ? seizure (MRI showed brain mets)  . HERNIA REPAIR Bilateral    Inguinal  . INGUINAL HERIIORRHAPHY BILATERALLY    . IR ANGIOGRAM SELECTIVE EACH ADDITIONAL VESSEL  12/03/2017  . IR ANGIOGRAM VISCERAL SELECTIVE  12/03/2017  . IR ANGIOGRAM VISCERAL SELECTIVE  12/03/2017  . IR EMBO ARTERIAL NOT HEMORR HEMANG INC GUIDE ROADMAPPING   12/03/2017  . IR GENERIC HISTORICAL  07/20/2016   IR RADIOLOGIST EVAL & MGMT 07/20/2016 [facility] INTERV RAD  . IR GENERIC HISTORICAL  08/02/2016   IR RADIOLOGIST EVAL & MGMT 08/02/2016 Sandi Mariscal, MD [facility] INTERV RAD  . IR RADIOLOGIST EVAL & MGMT  11/08/2017  . IR US GUIDE VASC ACCESS RIGHT  12/03/2017  . MEDIASTINOSCOPY N/A 05/01/2013   Procedure: MEDIASTINOSCOPY;  Surgeon: Gaye Pollack, MD;  Location: [facility] OR;  Service: Thoracic;  Laterality: N/A;  . PFTs  04/2013   Minimal obstructive airway disease  . PILONIDAL CYST EXCISION    . PROSTATE BIOPSY N/A 10/09/2012   Procedure: PROSTATIC URETHRAL BIOPSY;  Surgeon: Molli Hazard, MD;  Location: [facility];  Service: Urology;  Laterality: N/A;  PROSTATIC URETHRAL BIOPSY   . PTCA  1992  . ROTATOR CUFF REPAIR     Bilateral  . THOROCOTOMY  WITH LOBECTOMY Left 05/29/2013   Procedure: LEFT THOROCOTOMY WITH LEFT UPPER LOBE LOBECTOMY;  Surgeon: Gaye Pollack, MD;  Location: [facility];  Service: Thoracic;  Laterality: Left;  Marland Kitchen VIDEO BRONCHOSCOPY N/A 05/01/2013   Procedure: VIDEO BRONCHOSCOPY;  Surgeon: Gaye Pollack, MD;  Location: [facility] OR;  Service: Thoracic;  Laterality: N/A;   Family History  Problem Relation Age of Onset  . Hypertension Mother   . Prostate cancer Father        Prostate cancer  . Cancer Father        Brain Tumor  . Lung cancer Sister        NON SMOKER  . Cancer Sister   . Hyperlipidemia Sister   . Hyperlipidemia Brother   . Heart attack Brother   . Hypertension Brother   . Diabetes Neg Hx   . Stroke Neg Hx    Social History   Socioeconomic History  . Marital status: Married    Spouse name: Not on file  . Number of children: Not on file  . Years of education: Not on file  . Highest education level: Not on file  Occupational History  . Occupation: Retired Scientist, clinical (histocompatibility and immunogenetics)  . Financial resource strain: Not on file  . Food insecurity:    Worry: Not on file    Inability: Not on file  . Transportation  needs:    Medical: Not on file    Non-medical: Not on file  Tobacco Use  . Smoking status: Current Every Day Smoker    Packs/day: 1.00    Years: 24.00    Pack years: 24.00    Types: Cigarettes    Last attempt to quit: 05/28/2013    Years since quitting: 4.5  . Smokeless tobacco: Never Used  . Tobacco comment: QUIT 15 YEARS AGO-07/03/17 smoking  Substance and Sexual Activity  . Alcohol use: Yes    Alcohol/week: 9.0 oz    Types: 3 Glasses of wine, 12 Cans of beer per week    Comment: 1-2 beers a day  . Drug use: No  . Sexual activity: Not on file  Lifestyle  . Physical activity:    Days per week: Not on file    Minutes per session: Not on file  . Stress: Not on file  Relationships  . Social connections:    Talks on phone: Not on file    Gets together: Not on file    Attends religious service: Not on file    Active member of club or organization: Not on file    Attends meetings of clubs or organizations: Not on file    Relationship status: Not on file  Other Topics Concern  . Not on file  Social History Narrative   HEART HEALTHY DIET.     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   MARRIED, 2 children who live in [place].  Smoked on and off since then.   ALCOHOL USE -YES- RED WINE SOCIALLY, couple of beers at night usually.    Outpatient Encounter Medications as of 12/14/2017  Medication Sig  . amLODipine (NORVASC) 5 MG tablet TAKE 1 TABLET BY MOUTH EVERY DAY  . aspirin EC 81 MG tablet Take 81 mg by mouth at bedtime.   Marland Kitchen atorvastatin (LIPITOR) 20 MG tablet Take 1 tablet (20 mg total) by mouth daily. Needs appointment for further refills.  . budesonide-formoterol (SYMBICORT) 160-4.5 MCG/ACT inhaler INHALE 1-2 PUFFS INTO THE LINGS EVERY 12 HOURS. GARGLE AND  SPIT AFTER USE. (Patient taking differently: Inhale 1-2 puffs into the lungs every 12 (twelve) hours as needed (shortness of breath). )  . lamoTRIgine (LAMICTAL) 200 MG tablet TAKE 1 TABLET BY MOUTH IN THE MORNING.  TAKE 1.5 TABLETS BY MOUTH IN THE EVENING (Patient taking differently: Take 200 mg by mouth twice daily)  . metoprolol tartrate (LOPRESSOR) 50 MG tablet TAKE 1 TABLET BY MOUTH TWICE A DAY  . Multiple Vitamin (MULTIVITAMIN) tablet Take 1 tablet by mouth daily.   . pentoxifylline (TRENTAL) 400 MG CR tablet Take 1 tablet (400 mg total) by mouth 3 (three) times daily.  . vitamin E 1000 UNIT capsule Take 1 capsule (1,000 Units total) by mouth daily.  . diphenhydrAMINE (BENADRYL) 50 MG tablet Take 1 tablet (50 mg total) by mouth as directed. Take 1 tablet (50 mg) by mouth 1 hr prior to CTA (scheduled for 11/08/2017) (Patient not taking: Reported on 11/27/2017)  . predniSONE (DELTASONE) 50 MG tablet Take 1 tablet (50 mg total) by mouth as directed. Take Prednisone 50 mg by mouth 13 hrs, 7 hrs and 1 hr prior to CTA (scheduled 11/08/2017). (Patient not taking: Reported on 11/27/2017)  . Zoster Vaccine Adjuvanted [facility]) injection Inject 0.5 mLs into the muscle once for 1 dose.   No facility-administered encounter medications on file as of 12/14/2017.     Activities of Daily Living In your present state of health, do you have any difficulty performing the following activities: 12/14/2017 12/03/2017  Hearing? N N  Vision? N N  Difficulty concentrating or making decisions? N N  Walking or climbing stairs? N N  Dressing or bathing? N N  Doing errands, shopping? N -  Preparing Food and eating ? N -  Using the Toilet? N -  In the past six months, have you accidently leaked urine? N -  Do you have problems with loss of bowel control? N -  Managing your Medications? N -  Managing your Finances? N -  Housekeeping or managing your Housekeeping? N -  Some recent data might be hidden    Patient Care Team: Tammi Sou, MD as PCP - General (Family Medicine) Kyung Rudd, MD as Consulting Physician (Radiation Oncology) Serafina Mitchell, MD as Consulting Physician (Vascular Surgery) Jovita Gamma, MD as  Consulting Physician (Neurosurgery) Cameron Sprang, MD as Consulting Physician (Neurology) [name], MD as Consulting Physician (Oncology) Sandi Mariscal, MD as Consulting Physician (Interventional Radiology)   Assessment:   This is a routine wellness examination for [place].  Exercise Activities and Dietary recommendations Current Exercise Habits: The patient does not participate in regular exercise at present(Works on airplane), Exercise limited by: None identified   Diet (meal preparation, eat out, water intake, caffeinated beverages, dairy products, fruits and vegetables): Drinks water.   Breakfast: boiled egg, cereal, coffee Lunch: sandwich, leftovers Dinner: protein and veggies.   Goals    . smoking cessation     Quit smoking.        Fall Risk Fall Risk  12/14/2017 08/27/2017 08/02/2017 03/30/2017 02/14/2017  Falls in the past year? No No No No No  Number falls in past yr: - - - - -  Injury with Fall? - - - - -    Depression Screen PHQ 2/9 Scores 12/14/2017 08/27/2017 02/14/2017 10/30/2016  PHQ - 2 Score 0 0 0 0  PHQ- 9 Score - - - -    Cognitive Function MMSE - Mini Mental State Exam 12/14/2017  Orientation to time 5  Orientation  to Place 5  Registration 3  Attention/ Calculation 2  Recall 2  Language- name 2 objects 2  Language- repeat 1  Language- follow 3 step command 3  Language- read & follow direction 1  Write a sentence 1  Copy design 1  Total score 26        Immunization History  Administered Date(s) Administered  . Influenza Split 07/10/2012  . Influenza Whole 05/15/2008, 07/08/2009, 07/05/2010  . Influenza, High Dose Seasonal PF 08/04/2015, 06/12/2016, 06/01/2017  . Influenza,inj,Quad PF,6+ Mos 06/06/2013, 05/08/2014  . Pneumococcal Conjugate-13 10/06/2015  . Pneumococcal Polysaccharide-23 06/06/2013     Screening Tests Health Maintenance  Topic Date Due  . TETANUS/TDAP  01/24/1961  . INFLUENZA VACCINE  03/21/2018  . COLONOSCOPY   03/31/2020  . PNA vac Low Risk Adult  Completed        Plan:     Shingle vaccine at pharmacy.   Bring a copy of your living will and/or healthcare power of attorney to your next office visit.  Continue doing brain stimulating activities (puzzles, reading, adult coloring books, staying active) to keep memory sharp.   I have personally reviewed and noted the following in the patient's chart:   . Medical and social history . Use of alcohol, tobacco or illicit drugs  . Current medications and supplements . Functional ability and status . Nutritional status . Physical activity . Advanced directives . List of other physicians . Hospitalizations, surgeries, and ER visits in previous 12 months . Vitals . Screenings to include cognitive, depression, and falls . Referrals and appointments  In addition, I have reviewed and discussed with patient certain preventive protocols, quality metrics, and best practice recommendations. A written personalized care plan for preventive services as well as general preventive health recommendations were provided to patient.     Gerilyn Nestle, RN  12/14/2017

## 2017-12-14 ENCOUNTER — Ambulatory Visit (INDEPENDENT_AMBULATORY_CARE_PROVIDER_SITE_OTHER): Payer: Medicare Other

## 2017-12-14 ENCOUNTER — Ambulatory Visit (INDEPENDENT_AMBULATORY_CARE_PROVIDER_SITE_OTHER): Payer: Medicare Other | Admitting: Family Medicine

## 2017-12-14 ENCOUNTER — Other Ambulatory Visit: Payer: Self-pay

## 2017-12-14 ENCOUNTER — Encounter: Payer: Self-pay | Admitting: Family Medicine

## 2017-12-14 VITALS — BP 144/82 | HR 93 | Temp 98.3°F | Resp 16 | Ht 62.0 in | Wt 145.8 lb

## 2017-12-14 VITALS — BP 144/82 | HR 93 | Temp 98.3°F | Resp 16 | Ht 62.0 in | Wt 145.5 lb

## 2017-12-14 DIAGNOSIS — Z0000 Encounter for general adult medical examination without abnormal findings: Secondary | ICD-10-CM | POA: Diagnosis not present

## 2017-12-14 DIAGNOSIS — E7800 Pure hypercholesterolemia, unspecified: Secondary | ICD-10-CM | POA: Diagnosis not present

## 2017-12-14 DIAGNOSIS — Z23 Encounter for immunization: Secondary | ICD-10-CM

## 2017-12-14 DIAGNOSIS — I10 Essential (primary) hypertension: Secondary | ICD-10-CM | POA: Diagnosis not present

## 2017-12-14 DIAGNOSIS — I739 Peripheral vascular disease, unspecified: Secondary | ICD-10-CM

## 2017-12-14 DIAGNOSIS — R972 Elevated prostate specific antigen [PSA]: Secondary | ICD-10-CM | POA: Diagnosis not present

## 2017-12-14 LAB — CBC WITH DIFFERENTIAL/PLATELET
Basophils Absolute: 0.1 10*3/uL (ref 0.0–0.1)
Basophils Relative: 1 % (ref 0.0–3.0)
Eosinophils Absolute: 0.2 10*3/uL (ref 0.0–0.7)
Eosinophils Relative: 1.8 % (ref 0.0–5.0)
HCT: 44.3 % (ref 39.0–52.0)
Hemoglobin: 14.9 g/dL (ref 13.0–17.0)
Lymphocytes Relative: 14.5 % (ref 12.0–46.0)
Lymphs Abs: 1.4 10*3/uL (ref 0.7–4.0)
MCHC: 33.6 g/dL (ref 30.0–36.0)
MCV: 87.3 fl (ref 78.0–100.0)
Monocytes Absolute: 0.9 10*3/uL (ref 0.1–1.0)
Monocytes Relative: 9.1 % (ref 3.0–12.0)
Neutro Abs: 7.2 10*3/uL (ref 1.4–7.7)
Neutrophils Relative %: 73.6 % (ref 43.0–77.0)
Platelets: 279 10*3/uL (ref 150.0–400.0)
RBC: 5.08 Mil/uL (ref 4.22–5.81)
RDW: 15.2 % (ref 11.5–15.5)
WBC: 9.8 10*3/uL (ref 4.0–10.5)

## 2017-12-14 LAB — LIPID PANEL
Cholesterol: 142 mg/dL (ref 0–200)
HDL: 51.9 mg/dL (ref >39.00)
LDL Cholesterol: 77 mg/dL (ref 0–99)
NonHDL: 90.53
Total CHOL/HDL Ratio: 3
Triglycerides: 70 mg/dL (ref 0.0–149.0)
VLDL: 14 mg/dL (ref 0.0–40.0)

## 2017-12-14 LAB — COMPREHENSIVE METABOLIC PANEL
ALT: 11 U/L (ref 0–53)
AST: 11 U/L (ref 0–37)
Albumin: 4.2 g/dL (ref 3.5–5.2)
Alkaline Phosphatase: 61 U/L (ref 39–117)
BUN: 12 mg/dL (ref 6–23)
CO2: 29 mEq/L (ref 19–32)
Calcium: 9.7 mg/dL (ref 8.4–10.5)
Chloride: 106 mEq/L (ref 96–112)
Creatinine, Ser: 0.93 mg/dL (ref 0.40–1.50)
GFR: 83.99 mL/min (ref >60.00)
Glucose, Bld: 92 mg/dL (ref 70–99)
Potassium: 5.1 mEq/L (ref 3.5–5.1)
Sodium: 143 mEq/L (ref 135–145)
Total Bilirubin: 0.4 mg/dL (ref 0.2–1.2)
Total Protein: 6.9 g/dL (ref 6.0–8.3)

## 2017-12-14 LAB — PSA: PSA: 31.32 ng/mL — ABNORMAL HIGH (ref 0.10–4.00)

## 2017-12-14 MED ORDER — ZOSTER VAC RECOMB ADJUVANTED 50 MCG/0.5ML IM SUSR: 0.5000 mL | Freq: Once | INTRAMUSCULAR | 1 refills | Status: AC

## 2017-12-14 NOTE — Progress Notes (Signed)
Office Note 12/14/2017  CC:  Chief Complaint  Patient presents with  . Annual Exam    Pt is fasting.     HPI:  Jordan Mccullough is a 77 y.o. White male who is here for annual health maintenance exam.  Exercise: lots of activity but no formal regimen. Diet: healthy, low fat, no added salt. Dental preventatives UTD.  Has hx of aortic endovascular stent/graft. On 12/03/17, pt had successful percutaneous coil and onyx embolization of his endoleak of his aneurismal sac (by IR). Has f/u with IR 01/01/18.  CBC and BMET normal at the time of this procedure. Will repeat CMET and CBC today.  HTN: no home bp monitoring.  He did not take his meds today.   Past Medical History:  Diagnosis Date  . AAA (abdominal aortic aneurysm) (HCC)    s/p repair (aortoiliac bipass; with persistent endograft leak in inferior mesenteric artery)--stable as of 01/2017 vascular f/u.  Marland Kitchen Asymptomatic cholelithiasis 07/2015   Incidental finding on PET CT  . Back pain 04/19/2016  . Coronary artery disease    MI 1992, S/P  PTCA; negative stress test in November 2011 with no ischemia.   . Diverticulosis   . Hearing loss    Bilateral   . History of radiation therapy 11/10/13-12/12/13   lung,50Gy/60fx  . Hyperlipidemia    Crestor= myalgias  . Hypertension   . Lung cancer ([place]) 05/2013   non-small cell;  L upper lobectomy with mediastinal LN dissection, chemo, and radiation in 2014/2015. Remission until pt had questionable seizure 07/2015--MRI showed brain mets; palliative brain rad (stereotactic radiation therapy) started 08/25/15.  CT C/A/P clear 02/2016, 05/2016, and 11/2016.  Plan per onc is to repeat in 6 mo.  . Malignant neoplasm of upper lobe, left bronchus or lung ([place]) 10/29/2013   ?New spiculated 4.5cm mass in L upper lung field on CXR at [facility] in [place] 07/14/17.  Dr. Julien Nordmann (onc) recommended f/u CXR after abx course.  If mass unchanged then repeat CT chest.  . Malignant neoplasm of  upper lobe, left bronchus or lung ([place]) 06/26/2013  . Myocardial infarction [facility]) 1992   Dr Angelena Form    . Peripheral vascular disease ([place]) 08/2012   4.8x4.6 cm infrarenal abdominal aortic fusiform aneurysm, 1.5 cm right common iliac artery aneurysm.  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  . S/P radiation therapy [facility] 08/25/15   Stereotactic radiation therapy: frontoparietal 18gy,posterior frontal lobe 20gy,  . Seizure disorder ([place]) 2016/2017   as sequela of brain mets;  Grand mal seizure 07/2015, then got on keppra and was seizure-free until focal motor seizures of L side of face began 02/2016- these responded well to up-titration of keppra but pt had adverse side effects so eventually pt had to be switched over to lamictal 07/2016.  Marland Kitchen Shortness of breath   . Tobacco dependence    "Quit" 1992, but pt has smoked "on and off" since that time    Past Surgical History:  Procedure Laterality Date  . ABDOMINAL AORTIC ENDOVASCULAR STENT GRAFT N/A 05/07/2014   Procedure: ABDOMINAL AORTIC ENDOVASCULAR STENT GRAFT;  Surgeon: Serafina Mitchell, MD;  Location: [facility] OR;  Service: Vascular;  Laterality: N/A;  . Carotid duplex dopplers  07/2015   1-39% on R, no signif dz noted on L  . COLONOSCOPY W/ POLYPECTOMY  2003    negative 2010,due 2020; Dr Olevia Perches  . CORONARY  ANGIOPLASTY     no stents  . CYSTOSCOPY/RETROGRADE/URETEROSCOPY Bilateral 10/09/2012   Procedure: BILATERAL RETROGRADE bladder and urethral BIOPSY ;  Surgeon: Molli Hazard, MD;  Location: [facility];  Service: Urology;  Laterality: Bilateral;  BILATERAL RETROGRADE   . EEG  08/05/15   Pt placed on keppra just prior to this test due to having ? seizure (MRI showed brain mets)  . HERNIA REPAIR Bilateral    Inguinal  . INGUINAL HERIIORRHAPHY BILATERALLY    . IR ANGIOGRAM SELECTIVE EACH ADDITIONAL VESSEL  12/03/2017  . IR ANGIOGRAM VISCERAL  SELECTIVE  12/03/2017  . IR ANGIOGRAM VISCERAL SELECTIVE  12/03/2017  . IR EMBO ARTERIAL NOT HEMORR HEMANG INC GUIDE ROADMAPPING  12/03/2017  . IR GENERIC HISTORICAL  07/20/2016   IR RADIOLOGIST EVAL & MGMT 07/20/2016 [facility] INTERV RAD  . IR GENERIC HISTORICAL  08/02/2016   IR RADIOLOGIST EVAL & MGMT 08/02/2016 Sandi Mariscal, MD [facility] INTERV RAD  . IR RADIOLOGIST EVAL & MGMT  11/08/2017  . IR US GUIDE VASC ACCESS RIGHT  12/03/2017  . MEDIASTINOSCOPY N/A 05/01/2013   Procedure: MEDIASTINOSCOPY;  Surgeon: Gaye Pollack, MD;  Location: [facility] OR;  Service: Thoracic;  Laterality: N/A;  . PFTs  04/2013   Minimal obstructive airway disease  . PILONIDAL CYST EXCISION    . PROSTATE BIOPSY N/A 10/09/2012   Procedure: PROSTATIC URETHRAL BIOPSY--normal per pt;  Surgeon: Molli Hazard, MD;  Location: [facility];  Service: Urology;  Laterality: N/A;  PROSTATIC URETHRAL BIOPSY  . PTCA  1992  . ROTATOR CUFF REPAIR     Bilateral  . THOROCOTOMY WITH LOBECTOMY Left 05/29/2013   Procedure: LEFT THOROCOTOMY WITH LEFT UPPER LOBE LOBECTOMY;  Surgeon: Gaye Pollack, MD;  Location: [facility];  Service: Thoracic;  Laterality: Left;  Marland Kitchen VIDEO BRONCHOSCOPY N/A 05/01/2013   Procedure: VIDEO BRONCHOSCOPY;  Surgeon: Gaye Pollack, MD;  Location: [facility] OR;  Service: Thoracic;  Laterality: N/A;    Family History  Problem Relation Age of Onset  . Hypertension Mother   . Prostate cancer Father        Prostate cancer  . Cancer Father        Brain Tumor  . Lung cancer Sister        NON SMOKER  . Cancer Sister   . Hyperlipidemia Sister   . Hyperlipidemia Brother   . Heart attack Brother   . Hypertension Brother   . Diabetes Neg Hx   . Stroke Neg Hx     Social History   Socioeconomic History  . Marital status: Married    Spouse name: Not on file  . Number of children: Not on file  . Years of education: Not on file  . Highest education level: Not on file  Occupational History  . Occupation: Retired Insurance account manager  . Financial resource strain: Not on file  . Food insecurity:    Worry: Not on file    Inability: Not on file  . Transportation needs:    Medical: Not on file    Non-medical: Not on file  Tobacco Use  . Smoking status: Current Every Day Smoker    Packs/day: 1.00    Years: 24.00    Pack years: 24.00    Types: Cigarettes    Last attempt to quit: 05/28/2013    Years since quitting: 4.5  . Smokeless tobacco: Never Used  . Tobacco comment: QUIT 15 YEARS AGO-07/03/17 smoking  Substance and Sexual Activity  .  Alcohol use: Yes    Alcohol/week: 9.0 oz    Types: 3 Glasses of wine, 12 Cans of beer per week    Comment: 1-2 beers a day  . Drug use: No  . Sexual activity: Not on file  Lifestyle  . Physical activity:    Days per week: Not on file    Minutes per session: Not on file  . Stress: Not on file  Relationships  . Social connections:    Talks on phone: Not on file    Gets together: Not on file    Attends religious service: Not on file    Active member of club or organization: Not on file    Attends meetings of clubs or organizations: Not on file    Relationship status: Not on file  . Intimate partner violence:    Fear of current or ex partner: Not on file    Emotionally abused: Not on file    Physically abused: Not on file    Forced sexual activity: Not on file  Other Topics Concern  . Not on file  Social History Narrative   HEART HEALTHY DIET.     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   MARRIED, 2 children who live in [place].  Smoked on and off since then.   ALCOHOL USE -YES- RED WINE SOCIALLY, couple of beers at night usually.    Outpatient Medications Prior to Visit  Medication Sig Dispense Refill  . amLODipine (NORVASC) 5 MG tablet TAKE 1 TABLET BY MOUTH EVERY DAY 90 tablet 1  . aspirin EC 81 MG tablet Take 81 mg by mouth at bedtime.     Marland Kitchen atorvastatin (LIPITOR) 20 MG tablet Take 1 tablet (20 mg total) by mouth daily. Needs appointment  for further refills. 90 tablet 0  . budesonide-formoterol (SYMBICORT) 160-4.5 MCG/ACT inhaler INHALE 1-2 PUFFS INTO THE LINGS EVERY 12 HOURS. GARGLE AND SPIT AFTER USE. 10.2 Inhaler 3  . diphenhydrAMINE (BENADRYL) 50 MG tablet Take 1 tablet (50 mg total) by mouth as directed. Take 1 tablet (50 mg) by mouth 1 hr prior to CTA (scheduled for 11/08/2017) 1 tablet 0  . lamoTRIgine (LAMICTAL) 200 MG tablet TAKE 1 TABLET BY MOUTH IN THE MORNING. TAKE 1.5 TABLETS BY MOUTH IN THE EVENING (Patient taking differently: Take 200 mg by mouth twice daily) 72 tablet 5  . metoprolol tartrate (LOPRESSOR) 50 MG tablet TAKE 1 TABLET BY MOUTH TWICE A DAY 180 tablet 1  . Multiple Vitamin (MULTIVITAMIN) tablet Take 1 tablet by mouth daily.     . pentoxifylline (TRENTAL) 400 MG CR tablet Take 1 tablet (400 mg total) by mouth 3 (three) times daily. 90 tablet 5  . predniSONE (DELTASONE) 50 MG tablet Take 1 tablet (50 mg total) by mouth as directed. Take Prednisone 50 mg by mouth 13 hrs, 7 hrs and 1 hr prior to CTA (scheduled 11/08/2017). 3 tablet 0  . vitamin E 1000 UNIT capsule Take 1 capsule (1,000 Units total) by mouth daily. 30 capsule 12  . Zoster Vaccine Adjuvanted [facility]) injection Inject 0.5 mLs into the muscle once for 1 dose. 0.5 mL 1   No facility-administered medications prior to visit.     Allergies  Allergen Reactions  . Fluvirin [Influenza Vac Split Quad] Nausea And Vomiting and Other (See Comments)    Seizure like activity?  Marland Kitchen Rosuvastatin Other (See Comments)    Myalgias and dark urine  . Iodinated Diagnostic Agents Hives    1  hive on lt cheek lasting approximately 1 hour on last 2 CT per pt; needs pre meds in future; 50 mg benadryl po 1 hr prior to exam per Dr. Weber Cooks 07/2014  11/2016 per Tery Sanfilippo, pt needs 13hr premeds per our policy.    ROS Review of Systems  Constitutional: Negative for appetite change, chills, fatigue and fever.  HENT: Negative for congestion, dental problem, ear pain and  sore throat.   Eyes: Negative for discharge, redness and visual disturbance.  Respiratory: Negative for cough, chest tightness, shortness of breath and wheezing.   Cardiovascular: Negative for chest pain, palpitations and leg swelling.  Gastrointestinal: Negative for abdominal pain, blood in stool, diarrhea, nausea and vomiting.  Genitourinary: Negative for difficulty urinating, dysuria, flank pain, frequency, hematuria and urgency.  Musculoskeletal: Negative for arthralgias, back pain, joint swelling, myalgias and neck stiffness.  Skin: Positive for wound (scratch on L wrist from dog 2 d/a). Negative for pallor and rash.  Neurological: Negative for dizziness, speech difficulty, weakness and headaches.  Hematological: Negative for adenopathy. Does not bruise/bleed easily.  Psychiatric/Behavioral: Negative for confusion and sleep disturbance. The patient is not nervous/anxious.      PE; Blood pressure (!) 144/82, pulse 93, temperature 98.3 F (36.8 C), temperature source Oral, resp. rate 16, height 5\' 2"  (1.575 m), weight 145 lb 8 oz (66 kg), SpO2 97 %. Gen: Alert, well appearing.  Patient is oriented to person, place, time, and situation. AFFECT: pleasant, lucid thought and speech. ENT: Ears: EACs clear, normal epithelium.  TMs with good light reflex and landmarks bilaterally.  Eyes: no injection, icteris, swelling, or exudate.  EOMI, PERRLA. Nose: no drainage or turbinate edema/swelling.  No injection or focal lesion.  Mouth: lips without lesion/swelling.  Oral mucosa pink and moist.  Dentition intact and without obvious caries or gingival swelling.  Oropharynx without erythema, exudate, or swelling.  Neck: supple/nontender.  No LAD, mass, or TM.  Carotid pulses 2+ bilaterally, without bruits. CV: RRR, no m/r/g.   LUNGS: CTA bilat, nonlabored resps, good aeration in all lung fields. ABD: soft, NT, ND, BS normal.  No hepatospenomegaly or mass.  No bruits. EXT: no clubbing, cyanosis, or  edema.  Musculoskeletal: no joint swelling, erythema, warmth, or tenderness.  ROM of all joints intact. Skin - no sores or suspicious lesions or rashes or color changes.  Healing small superficial abrasion on L wrist (dog scratch). Rectal exam: negative without mass, lesions or tenderness, PROSTATE EXAM: smooth and symmetric without nodules or tenderness.   Pertinent labs:  Lab Results  Component Value Date   TSH 0.708 08/05/2015   Lab Results  Component Value Date   WBC 7.5 12/03/2017   HGB 16.3 12/03/2017   HCT 46.6 12/03/2017   MCV 86.3 12/03/2017   PLT 279 12/03/2017   Lab Results  Component Value Date   CREATININE 1.03 12/03/2017   BUN 18 12/03/2017   NA 139 12/03/2017   K 4.3 12/03/2017   CL 107 12/03/2017   CO2 20 (L) 12/03/2017   Lab Results  Component Value Date   ALT 17 06/18/2017   AST 15 06/18/2017   ALKPHOS 63 06/18/2017   BILITOT 0.35 06/18/2017   Lab Results  Component Value Date   CHOL 146 09/27/2016   Lab Results  Component Value Date   HDL 45.00 09/27/2016   Lab Results  Component Value Date   LDLCALC 84 09/27/2016   Lab Results  Component Value Date   TRIG 88.0 09/27/2016   Lab Results  Component Value Date   CHOLHDL 3 09/27/2016   Lab Results  Component Value Date   PSA 13.43 (H) 08/13/2013   PSA 6.47 (H) 07/03/2012   PSA 4.43 (H) 08/09/2011   Lab Results  Component Value Date   HGBA1C 5.7 07/04/2013    ASSESSMENT AND PLAN:   Health maintenance exam: Reviewed age and gender appropriate health maintenance issues (prudent diet, regular exercise, health risks of tobacco and excessive alcohol, use of seatbelts, fire alarms in home, use of sunscreen).  Also reviewed age and gender appropriate health screening as well as vaccine recommendations. Vaccines: due for Tdap--given today here b/c of recent dog scratch on L wrist.  Shingrix discussed--ordered for pharmacy to give. Labs: CBC, CMET, PSA, FLP. Prostate ca screening: DRE  normal, PSA today.  Hx of elevated PSA with neg prostate bx approx 4-5 yrs ago. Colon ca screening: next colonoscopy due 2020.  An After Visit Summary was printed and given to the patient.  FOLLOW UP:  Return in about 6 months (around 06/15/2018) for routine chronic illness f/u.  Signed:  Crissie Sickles, MD           12/14/2017

## 2017-12-14 NOTE — Addendum Note (Signed)
Addended byCallie Fielding on: 12/14/2017 10:38 AM   Modules accepted: Orders

## 2017-12-14 NOTE — Patient Instructions (Signed)

## 2017-12-14 NOTE — Progress Notes (Signed)
AWV reviewed and agree. Signed:  Crissie Sickles, MD           12/14/2017

## 2017-12-14 NOTE — Patient Instructions (Addendum)
Shingle vaccine at pharmacy.   Bring a copy of your living will and/or healthcare power of attorney to your next office visit.  Continue doing brain stimulating activities (puzzles, reading, adult coloring books, staying active) to keep memory sharp.    Health Maintenance, Male A healthy lifestyle and preventive care is important for your health and wellness. Ask your health care provider about what schedule of regular examinations is right for you. What should I know about weight and diet? Eat a Healthy Diet  Eat plenty of vegetables, fruits, whole grains, low-fat dairy products, and lean protein.  Do not eat a lot of foods high in solid fats, added sugars, or salt.  Maintain a Healthy Weight Regular exercise can help you achieve or maintain a healthy weight. You should:  Do at least 150 minutes of exercise each week. The exercise should increase your heart rate and make you sweat (moderate-intensity exercise).  Do strength-training exercises at least twice a week.  Watch Your Levels of Cholesterol and Blood Lipids  Have your blood tested for lipids and cholesterol every 5 years starting at 76 years of age. If you are at high risk for heart disease, you should start having your blood tested when you are 76 years old. You may need to have your cholesterol levels checked more often if: ? Your lipid or cholesterol levels are high. ? You are older than 76 years of age. ? You are at high risk for heart disease.  What should I know about cancer screening? Many types of cancers can be detected early and may often be prevented. Lung Cancer  You should be screened every year for lung cancer if: ? You are a current smoker who has smoked for at least 30 years. ? You are a former smoker who has quit within the past 15 years.  Talk to your health care provider about your screening options, when you should start screening, and how often you should be screened.  Colorectal Cancer  Routine  colorectal cancer screening usually begins at 76 years of age and should be repeated every 5-10 years until you are 76 years old. You may need to be screened more often if early forms of precancerous polyps or small growths are found. Your health care provider may recommend screening at an earlier age if you have risk factors for colon cancer.  Your health care provider may recommend using home test kits to check for hidden blood in the stool.  A small camera at the end of a tube can be used to examine your colon (sigmoidoscopy or colonoscopy). This checks for the earliest forms of colorectal cancer.  Prostate and Testicular Cancer  Depending on your age and overall health, your health care provider may do certain tests to screen for prostate and testicular cancer.  Talk to your health care provider about any symptoms or concerns you have about testicular or prostate cancer.  Skin Cancer  Check your skin from head to toe regularly.  Tell your health care provider about any new moles or changes in moles, especially if: ? There is a change in a mole's size, shape, or color. ? You have a mole that is larger than a pencil eraser.  Always use sunscreen. Apply sunscreen liberally and repeat throughout the day.  Protect yourself by wearing long sleeves, pants, a wide-brimmed hat, and sunglasses when outside.  What should I know about heart disease, diabetes, and high blood pressure?  If you are 93-57 years of age,  have your blood pressure checked every 3-5 years. If you are 28 years of age or older, have your blood pressure checked every year. You should have your blood pressure measured twice-once when you are at a hospital or clinic, and once when you are not at a hospital or clinic. Record the average of the two measurements. To check your blood pressure when you are not at a hospital or clinic, you can use: ? An automated blood pressure machine at a pharmacy. ? A home blood pressure  monitor.  Talk to your health care provider about your target blood pressure.  If you are between 55-40 years old, ask your health care provider if you should take aspirin to prevent heart disease.  Have regular diabetes screenings by checking your fasting blood sugar level. ? If you are at a normal weight and have a low risk for diabetes, have this test once every three years after the age of 47. ? If you are overweight and have a high risk for diabetes, consider being tested at a younger age or more often.  A one-time screening for abdominal aortic aneurysm (AAA) by ultrasound is recommended for men aged 90-75 years who are current or former smokers. What should I know about preventing infection? Hepatitis B If you have a higher risk for hepatitis B, you should be screened for this virus. Talk with your health care provider to find out if you are at risk for hepatitis B infection. Hepatitis C Blood testing is recommended for:  Everyone born from [birth year] through 1964.  Anyone with known risk factors for hepatitis C.  Sexually Transmitted Diseases (STDs)  You should be screened each year for STDs including gonorrhea and chlamydia if: ? You are sexually active and are younger than 76 years of age. ? You are older than 76 years of age and your health care provider tells you that you are at risk for this type of infection. ? Your sexual activity has changed since you were last screened and you are at an increased risk for chlamydia or gonorrhea. Ask your health care provider if you are at risk.  Talk with your health care provider about whether you are at high risk of being infected with HIV. Your health care provider may recommend a prescription medicine to help prevent HIV infection.  What else can I do?  Schedule regular health, dental, and eye exams.  Stay current with your vaccines (immunizations).  Do not use any tobacco products, such as cigarettes, chewing tobacco, and  e-cigarettes. If you need help quitting, ask your health care provider.  Limit alcohol intake to no more than 2 drinks per day. One drink equals 12 ounces of beer, 5 ounces of wine, or 1 ounces of hard liquor.  Do not use street drugs.  Do not share needles.  Ask your health care provider for help if you need support or information about quitting drugs.  Tell your health care provider if you often feel depressed.  Tell your health care provider if you have ever been abused or do not feel safe at home. This information is not intended to replace advice given to you by your health care provider. Make sure you discuss any questions you have with your health care provider. Document Released: 02/03/2008 Document Revised: 04/05/2016 Document Reviewed: 05/11/2015 Elsevier Interactive Patient Education  2018 [employer].   Steps to Quit Smoking Smoking tobacco can be bad for your health. It can also affect almost every organ in  your body. Smoking puts you and people around you at risk for many serious long-lasting (chronic) diseases. Quitting smoking is hard, but it is one of the best things that you can do for your health. It is never too late to quit. What are the benefits of quitting smoking? When you quit smoking, you lower your risk for getting serious diseases and conditions. They can include:  Lung cancer or lung disease.  Heart disease.  Stroke.  Heart attack.  Not being able to have children (infertility).  Weak bones (osteoporosis) and broken bones (fractures).  If you have coughing, wheezing, and shortness of breath, those symptoms may get better when you quit. You may also get sick less often. If you are pregnant, quitting smoking can help to lower your chances of having a baby of low birth weight. What can I do to help me quit smoking? Talk with your doctor about what can help you quit smoking. Some things you can do (strategies) include:  Quitting smoking totally,  instead of slowly cutting back how much you smoke over a period of time.  Going to in-person counseling. You are more likely to quit if you go to many counseling sessions.  Using resources and support systems, such as: ? Database administrator with a Social worker. ? Phone quitlines. ? Careers information officer. ? Support groups or group counseling. ? Text messaging programs. ? Mobile phone apps or applications.  Taking medicines. Some of these medicines may have nicotine in them. If you are pregnant or breastfeeding, do not take any medicines to quit smoking unless your doctor says it is okay. Talk with your doctor about counseling or other things that can help you.  Talk with your doctor about using more than one strategy at the same time, such as taking medicines while you are also going to in-person counseling. This can help make quitting easier. What things can I do to make it easier to quit? Quitting smoking might feel very hard at first, but there is a lot that you can do to make it easier. Take these steps:  Talk to your family and friends. Ask them to support and encourage you.  Call phone quitlines, reach out to support groups, or work with a Social worker.  Ask people who smoke to not smoke around you.  Avoid places that make you want (trigger) to smoke, such as: ? Bars. ? Parties. ? Smoke-break areas at work.  Spend time with people who do not smoke.  Lower the stress in your life. Stress can make you want to smoke. Try these things to help your stress: ? Getting regular exercise. ? Deep-breathing exercises. ? Yoga. ? Meditating. ? Doing a body scan. To do this, close your eyes, focus on one area of your body at a time from head to toe, and notice which parts of your body are tense. Try to relax the muscles in those areas.  Download or buy apps on your mobile phone or tablet that can help you stick to your quit plan. There are many free apps, such as QuitGuide from the [employer] Office manager  for Disease Control and Prevention). You can find more support from smokefree.gov and other websites.  This information is not intended to replace advice given to you by your health care provider. Make sure you discuss any questions you have with your health care provider. Document Released: 06/03/2009 Document Revised: 04/04/2016 Document Reviewed: 12/22/2014 Elsevier Interactive Patient Education  2018 [employer].

## 2017-12-15 ENCOUNTER — Other Ambulatory Visit: Payer: Self-pay | Admitting: Family Medicine

## 2017-12-16 ENCOUNTER — Encounter: Payer: Self-pay | Admitting: Family Medicine

## 2017-12-20 ENCOUNTER — Ambulatory Visit
Admission: RE | Admit: 2017-12-20 | Discharge: 2017-12-20 | Disposition: A | Discharge status: Home / Self Care | Payer: Medicare Other | Source: Ambulatory Visit | Attending: Radiation Oncology | Admitting: Radiation Oncology

## 2017-12-20 DIAGNOSIS — C7931 Secondary malignant neoplasm of brain: Secondary | ICD-10-CM

## 2017-12-20 DIAGNOSIS — C7949 Secondary malignant neoplasm of other parts of nervous system: Secondary | ICD-10-CM

## 2017-12-20 DIAGNOSIS — G9389 Other specified disorders of brain: Secondary | ICD-10-CM | POA: Diagnosis not present

## 2017-12-20 MED ORDER — GADOBENATE DIMEGLUMINE 529 MG/ML IV SOLN
14.0000 mL | Freq: Once | INTRAVENOUS | Status: AC | PRN
  Administered 2017-12-20: 14 mL via INTRAVENOUS

## 2017-12-20 NOTE — Progress Notes (Signed)
Jordan Mccullough. [place] 77 y.o. man with metastatic left lung cancer SRS to brain 08-25-15 12-20-17 MRI brain w wo contrast.   Headache:No Pain:No Dizziness:No Nausea/vomiting:No Ringing in ears:No Visual changes (Blurred/ diplopia double vision,blind spots, and peripheral vsion changes):Wears glasses Fatigue:Always tired and sleepy. Cognitive changes:No cognitive change since his last visit. Wt Readings from Last 3 Encounters:  12/25/17 144 lb 9.6 oz (65.6 kg)  12/14/17 145 lb 8 oz (66 kg)  12/14/17 145 lb 12.8 oz (66.1 kg)  BP 135/81 (BP Location: Right Arm, Patient Position: Sitting, Cuff Size: Normal)   Pulse 81   Temp 98.1 F (36.7 C) (Oral)   Resp 20   Ht 5\' 2"  (1.575 m)   Wt 144 lb 9.6 oz (65.6 kg)   SpO2 98%   BMI 26.45 kg/m

## 2017-12-25 ENCOUNTER — Other Ambulatory Visit: Payer: Self-pay

## 2017-12-25 ENCOUNTER — Encounter: Payer: Self-pay | Admitting: Radiation Oncology

## 2017-12-25 ENCOUNTER — Ambulatory Visit
Admission: RE | Admit: 2017-12-25 | Discharge: 2017-12-25 | Disposition: A | Discharge status: Home / Self Care | Payer: Medicare Other | Source: Ambulatory Visit | Attending: Radiation Oncology | Admitting: Radiation Oncology

## 2017-12-25 VITALS — BP 135/81 | HR 81 | Temp 98.1°F | Resp 20 | Ht 62.0 in | Wt 144.6 lb

## 2017-12-25 DIAGNOSIS — Z7982 Long term (current) use of aspirin: Secondary | ICD-10-CM | POA: Insufficient documentation

## 2017-12-25 DIAGNOSIS — C7931 Secondary malignant neoplasm of brain: Secondary | ICD-10-CM | POA: Insufficient documentation

## 2017-12-25 DIAGNOSIS — I2510 Atherosclerotic heart disease of native coronary artery without angina pectoris: Secondary | ICD-10-CM | POA: Insufficient documentation

## 2017-12-25 DIAGNOSIS — I714 Abdominal aortic aneurysm, without rupture: Secondary | ICD-10-CM | POA: Insufficient documentation

## 2017-12-25 DIAGNOSIS — I739 Peripheral vascular disease, unspecified: Secondary | ICD-10-CM | POA: Diagnosis not present

## 2017-12-25 DIAGNOSIS — C3412 Malignant neoplasm of upper lobe, left bronchus or lung: Secondary | ICD-10-CM | POA: Diagnosis not present

## 2017-12-25 DIAGNOSIS — E785 Hyperlipidemia, unspecified: Secondary | ICD-10-CM | POA: Insufficient documentation

## 2017-12-25 DIAGNOSIS — N400 Enlarged prostate without lower urinary tract symptoms: Secondary | ICD-10-CM | POA: Insufficient documentation

## 2017-12-25 DIAGNOSIS — Z08 Encounter for follow-up examination after completed treatment for malignant neoplasm: Secondary | ICD-10-CM | POA: Diagnosis not present

## 2017-12-25 DIAGNOSIS — T66XXXS Radiation sickness, unspecified, sequela: Secondary | ICD-10-CM | POA: Diagnosis not present

## 2017-12-25 DIAGNOSIS — R569 Unspecified convulsions: Secondary | ICD-10-CM | POA: Diagnosis not present

## 2017-12-25 DIAGNOSIS — Z87891 Personal history of nicotine dependence: Secondary | ICD-10-CM | POA: Insufficient documentation

## 2017-12-25 DIAGNOSIS — G40909 Epilepsy, unspecified, not intractable, without status epilepticus: Secondary | ICD-10-CM | POA: Insufficient documentation

## 2017-12-25 DIAGNOSIS — R972 Elevated prostate specific antigen [PSA]: Secondary | ICD-10-CM | POA: Insufficient documentation

## 2017-12-25 DIAGNOSIS — I252 Old myocardial infarction: Secondary | ICD-10-CM | POA: Diagnosis not present

## 2017-12-25 DIAGNOSIS — Z79899 Other long term (current) drug therapy: Secondary | ICD-10-CM | POA: Diagnosis not present

## 2017-12-25 DIAGNOSIS — I10 Essential (primary) hypertension: Secondary | ICD-10-CM | POA: Insufficient documentation

## 2017-12-25 DIAGNOSIS — Z923 Personal history of irradiation: Secondary | ICD-10-CM | POA: Insufficient documentation

## 2017-12-25 NOTE — Progress Notes (Signed)
Radiation Oncology         [phone]) [phone] ________________________________  Name: Jordan Mccullough MRN: [id number]  Date: 12/25/2017  DOB: 11/11/1940  Follow-Up Visit Note  CC: McGowen, Adrian Blackwater, MD  Gaye Pollack, MD  Diagnosis:   Metastatic Lung Cancer  Interval Since Radiotherapy: 2 years  08/25/15 SRS Treatment:    1.  PTV1 Rt Vertex Frontoparietal 9m target was treated using 4 Arcs to a prescription dose of 18 Gy. ExacTrac Snap verification was performed for each couch angle.  2.  PTV2  Rt Posterior Frontal Lobe 175mtarget was treated using 4 Arcs to a prescription dose of 20 Gy. ExacTrac Snap verification was performed for each couch angle.   Narrative:   [name] a very pleasant [age].o. gentleman with a history of metastatic non-small cell lung cancer who received SRS to 2 lesions in the frontoparietal region which he completed in January of 2017. He has had stability in the brain since his treatment. He also has done well systemically and is not due to see medical oncology until November 2019.  He underwent an MRI of the brain on 12/20/17 which showed stability of his previously treated disease. He has been treated for radionecrosis with vitamin E, and Trental. He has been offered the opportunity to discontinue but has had rebound headaches when trying to discontinue and for this reason continues. He also continues on Lamictal under the care of Dr. AqDelice LeschOf note he reports he had a recent PSA of 31. He is scheduled to see urology to review.  On review of systems, the patient reports that he is doing well overall. He denies any recent seizure activity, uncontrolled movements, headaches, or visual disturbances. He denies any chest pain, shortness of breath, cough, fevers, chills, night sweats, unintended weight changes. He denies any bowel or bladder disturbances, and denies abdominal pain, nausea or vomiting. He denies any new musculoskeletal or joint aches or pains, new skin lesions  or concerns. A complete review of systems is obtained and is otherwise negative.  Past Medical History:  Past Medical History:  Diagnosis Date  . AAA (abdominal aortic aneurysm) (HCC)    s/p repair (aortoiliac bipass; with persistent endograft leak in inferior mesenteric artery)--stable as of 01/2017 vascular f/u.  . Marland Kitchensymptomatic cholelithiasis 07/2015   Incidental finding on PET CT  . Back pain 04/19/2016  . BPH with elevated PSA    PSA signif rise 11/2017--recommended pt see his urologist again.  . Coronary artery disease    MI 1992, S/P  PTCA; negative stress test in November 2011 with no ischemia.   . Diverticulosis   . Hearing loss    Bilateral   . History of radiation therapy 11/10/13-12/12/13   lung,50Gy/2589f. Hyperlipidemia    Crestor= myalgias  . Hypertension   . [place]   non-small cell;  L upper lobectomy with mediastinal LN dissection, chemo, and radiation in 2014/2015. Remission until pt had questionable seizure 07/2015--MRI showed brain mets; palliative brain rad (stereotactic radiation therapy) started 08/25/15.  CT C/A/P clear 02/2016, 05/2016, and 11/2016.  Plan per onc is to repeat in 6 mo.  . Malignant neoplasm of upper lobe, left bronchus or lung ([facility] [facility]   ?[facility] 4.5cm [facility]  Dr. MohJulien Nordmannnc) recommended f/u CXR after abx course.  If mass unchanged then repeat CT chest.  . Malignant neoplasm of  upper lobe, left bronchus or lung ([place]) 06/26/2013  . Myocardial infarction [facility]) 1992   Dr Angelena Form    . Peripheral vascular disease ([place]) 08/2012   4.8x4.6 cm infrarenal abdominal aortic fusiform aneurysm, 1.5 cm right common iliac artery aneurysm.  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  . S/P radiation therapy [facility] 08/25/15   Stereotactic radiation therapy:  frontoparietal 18gy,posterior frontal lobe 20gy,  . Seizure disorder ([place]) 2016/2017   as sequela of brain mets;  Grand mal seizure 07/2015, then got on keppra and was seizure-free until focal motor seizures of L side of face began 02/2016- these responded well to up-titration of keppra but pt had adverse side effects so eventually pt had to be switched over to lamictal 07/2016.  Marland Kitchen Shortness of breath   . Tobacco dependence    "Quit" 1992, but pt has smoked "on and off" since that time    Past Surgical History: Past Surgical History:  Procedure Laterality Date  . ABDOMINAL AORTIC ENDOVASCULAR STENT GRAFT N/A 05/07/2014   Procedure: ABDOMINAL AORTIC ENDOVASCULAR STENT GRAFT;  Surgeon: Serafina Mitchell, MD;  Location: [place] OR;  Service: Vascular;  Laterality: N/A;  . Carotid duplex dopplers  07/2015   1-39% on R, no signif dz noted on L  . COLONOSCOPY W/ POLYPECTOMY  2003    negative 2010,due 2020; Dr Olevia Perches  . CORONARY ANGIOPLASTY     no stents  . CYSTOSCOPY/RETROGRADE/URETEROSCOPY Bilateral 10/09/2012   Procedure: BILATERAL RETROGRADE bladder and urethral BIOPSY ;  Surgeon: Molli Hazard, MD;  Location: [facility];  Service: Urology;  Laterality: Bilateral;  BILATERAL RETROGRADE   . EEG  08/05/15   Pt placed on keppra just prior to this test due to having ? seizure (MRI showed brain mets)  . HERNIA REPAIR Bilateral    Inguinal  . INGUINAL HERIIORRHAPHY BILATERALLY    . IR ANGIOGRAM SELECTIVE EACH ADDITIONAL VESSEL  12/03/2017  . IR ANGIOGRAM VISCERAL SELECTIVE  12/03/2017  . IR ANGIOGRAM VISCERAL SELECTIVE  12/03/2017  . IR EMBO ARTERIAL NOT HEMORR HEMANG INC GUIDE ROADMAPPING  12/03/2017  . IR GENERIC HISTORICAL  07/20/2016   IR RADIOLOGIST EVAL & MGMT 07/20/2016 [facility] INTERV RAD  . IR GENERIC HISTORICAL  08/02/2016   IR RADIOLOGIST EVAL & MGMT 08/02/2016 Sandi Mariscal, MD [facility] INTERV RAD  . IR RADIOLOGIST EVAL & MGMT  11/08/2017  . IR US GUIDE VASC ACCESS RIGHT  12/03/2017  .  MEDIASTINOSCOPY N/A 05/01/2013   Procedure: MEDIASTINOSCOPY;  Surgeon: Gaye Pollack, MD;  Location[facility] OR;  Service: Thoracic;  Laterality: N/A;  . PFTs  04/2013   Minimal obstructive airway disease  . PILONIDAL CYST EXCISION    . PROSTATE BIOPSY N/A 10/09/2012   Procedure: PROSTATIC URETHRAL BIOPSY--BPH--no evidence of malignancy;  Surgeon: Molli Hazard, MD;  Location: [facility];  Service: Urology;  Laterality: N/A;  PROSTATIC URETHRAL BIOPSY  . PTCA  1992  . ROTATOR CUFF REPAIR     Bilateral  . THOROCOTOMY WITH LOBECTOMY Left 05/29/2013   Procedure: LEFT THOROCOTOMY WITH LEFT UPPER LOBE LOBECTOMY;  Surgeon: Gaye Pollack, MD;  Location: [place];  Service: Thoracic;  Laterality: Left;  Marland Kitchen VIDEO BRONCHOSCOPY N/A 05/01/2013   Procedure: VIDEO BRONCHOSCOPY;  Surgeon: Gaye Pollack, MD;  Location: [facility] OR;  Service: Thoracic;  Laterality: N/A;    Social History:  Social History   Socioeconomic History  . Marital  status: Married    Spouse name: Not on file  . Number of children: Not on file  . Years of education: Not on file  . Highest education level: Not on file  Occupational History  . Occupation: Retired Scientist, clinical (histocompatibility and immunogenetics)  . Financial resource strain: Not on file  . Food insecurity:    Worry: Not on file    Inability: Not on file  . Transportation needs:    Medical: Not on file    Non-medical: Not on file  Tobacco Use  . Smoking status: Current Every Day Smoker    Packs/day: 1.00    Years: 24.00    Pack years: 24.00    Types: Cigarettes    Last attempt to quit: 05/28/2013    Years since quitting: 4.5  . Smokeless tobacco: Never Used  . Tobacco comment: QUIT 15 YEARS AGO-07/03/17 smoking  Substance and Sexual Activity  . Alcohol use: Yes    Alcohol/week: 9.0 oz    Types: 3 Glasses of wine, 12 Cans of beer per week    Comment: 1-2 beers a day  . Drug use: No  . Sexual activity: Not on file  Lifestyle  . Physical activity:    Days per week: Not on file      Minutes per session: Not on file  . Stress: Not on file  Relationships  . Social connections:    Talks on phone: Not on file    Gets together: Not on file    Attends religious service: Not on file    Active member of club or organization: Not on file    Attends meetings of clubs or organizations: Not on file    Relationship status: Not on file  . Intimate partner violence:    Fear of current or ex partner: Not on file    Emotionally abused: Not on file    Physically abused: Not on file    Forced sexual activity: Not on file  Other Topics Concern  . Not on file  Social History Narrative   HEART HEALTHY DIET.     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   MARRIED, 2 children who live in [place].  Smoked on and off since then.   ALCOHOL USE -YES- RED WINE SOCIALLY, couple of beers at night usually.    Family History: Family History  Problem Relation Age of Onset  . Hypertension Mother   . Prostate cancer Father        Prostate cancer  . Cancer Father        Brain Tumor  . Lung cancer Sister        NON SMOKER  . Cancer Sister   . Hyperlipidemia Sister   . Hyperlipidemia Brother   . Heart attack Brother   . Hypertension Brother   . Prostate cancer Brother   . Diabetes Neg Hx   . Stroke Neg Hx     ALLERGIES:  is allergic to fluvirin [influenza vac split quad]; rosuvastatin; and iodinated diagnostic agents.  Meds: Current Outpatient Medications  Medication Sig Dispense Refill  . amLODipine (NORVASC) 5 MG tablet TAKE 1 TABLET BY MOUTH EVERY DAY 90 tablet 1  . aspirin EC 81 MG tablet Take 81 mg by mouth at bedtime.     Marland Kitchen atorvastatin (LIPITOR) 20 MG tablet TAKE 1 TABLET (20 MG TOTAL) BY MOUTH DAILY. NEEDS APPOINTMENT FOR FURTHER REFILLS. 90 tablet 1  . budesonide-formoterol (SYMBICORT) 160-4.5 MCG/ACT inhaler INHALE 1-2 PUFFS  INTO THE LINGS EVERY 12 HOURS. GARGLE AND SPIT AFTER USE. 10.2 Inhaler 3  . lamoTRIgine (LAMICTAL) 200 MG tablet TAKE 1 TABLET BY MOUTH  IN THE MORNING. TAKE 1.5 TABLETS BY MOUTH IN THE EVENING (Patient taking differently: Take 200 mg by mouth twice daily) 72 tablet 5  . metoprolol tartrate (LOPRESSOR) 50 MG tablet TAKE 1 TABLET BY MOUTH TWICE A DAY 180 tablet 1  . Multiple Vitamin (MULTIVITAMIN) tablet Take 1 tablet by mouth daily.     . pentoxifylline (TRENTAL) 400 MG CR tablet Take 1 tablet (400 mg total) by mouth 3 (three) times daily. 90 tablet 5  . predniSONE (DELTASONE) 50 MG tablet Take 1 tablet (50 mg total) by mouth as directed. Take Prednisone 50 mg by mouth 13 hrs, 7 hrs and 1 hr prior to CTA (scheduled 11/08/2017). 3 tablet 0  . vitamin E 1000 UNIT capsule Take 1 capsule (1,000 Units total) by mouth daily. 30 capsule 12  . diphenhydrAMINE (BENADRYL) 50 MG tablet Take 1 tablet (50 mg total) by mouth as directed. Take 1 tablet (50 mg) by mouth 1 hr prior to CTA (scheduled for 11/08/2017) (Patient not taking: Reported on 12/25/2017) 1 tablet 0   No current facility-administered medications for this encounter.     Physical Findings:  height is 5' 2" (1.575 m) and weight is 144 lb 9.6 oz (65.6 kg). His oral temperature is 98.1 F (36.7 C). His blood pressure is 135/81 and his pulse is 81. His respiration is 20 and oxygen saturation is 98%.  In general this is a well appearing Caucasian male in no acute distress. He's alert and oriented x4 and appropriate throughout the examination. Cardiopulmonary assessment is negative for acute distress and he exhibits normal effort. The patient appears to be neurologically intact without focal findings.  Lab Findings: Lab Results  Component Value Date   WBC 9.8 12/14/2017   HGB 14.9 12/14/2017   HCT 44.3 12/14/2017   MCV 87.3 12/14/2017   PLT 279.0 12/14/2017     Radiographic Findings: Mr Jeri Cos [place] Contrast  Result Date: 12/20/2017 CLINICAL DATA:  Metastatic lung cancer.  Brain radiation 2017 EXAM: MRI HEAD WITHOUT AND WITH CONTRAST TECHNIQUE: Multiplanar, multiecho pulse  sequences of the brain and surrounding structures were obtained without and with intravenous contrast. CONTRAST:  72m MULTIHANCE GADOBENATE DIMEGLUMINE 529 MG/ML IV SOLN COMPARISON:  MRI head 08/23/2017 FINDINGS: Brain: Right parietal hemorrhagic lesion is unchanged. This has evidence of chronic hemorrhage in rim enhancement. The enhancing lesion measures 19 x 10 mm, unchanged High right parietal enhancing lesion with evidence of chronic hemorrhage also unchanged. This measures 6 x 12 mm on axial images. FLAIR hyperintensity in the white matter surrounding both lesions is unchanged. Scattered small white matter hyperintensities bilaterally do not enhance and are most likely due to chronic ischemia. Negative for acute infarct. Generalized atrophy without hydrocephalus. Vascular: Normal arterial flow void Skull and upper cervical spine: Negative Sinuses/Orbits: Negative Other: None IMPRESSION: Two treated lesions in the right parietal lobe are stable. No new lesions Atrophy and chronic microvascular ischemia in the white matter. Electronically Signed   By: CFranchot GalloM.D.   On: 12/20/2017 14:41   Ir Angiogram Visceral Selective  Result Date: 12/04/2017 INDICATION: History of abdominal aortic aneurysm with slowly enlarging aneurysm sac secondary to a presumed type 2 endoleak via the IMA. Please refer to formal consultation in the epic EMR dated 11/08/2017 for additional details. EXAM: 1. FLUSH ABDOMINAL AORTOGRAM 2. SUPERIOR MESENTERIC ARTERIOGRAM 3.  SELECTIVE MARGINAL ARTERY OF DRUMMOND ARTERIOGRAM 4. SELECTIVE INFERIOR MESENTERIC ARTERIOGRAM 5. SELECTED INJECTION OF NATIVE ABDOMINAL AORTIC ANEURYSM SAC WITH SUBSEQUENT COIL AND ONYX EMBOLIZATION. 6. ULTRASOUND GUIDED VASCULAR ACCESS OF THE RIGHT COMMON FEMORAL ARTERY COMPARISON:  CTA abdomen and pelvis - 11/08/2017 MEDICATIONS: None CONTRAST:  110 cc Isovue 300 (note, patient received standard steroid and Benadryl preprocedural prep given history of contrast  reaction and tolerated the contrast without complication) ANESTHESIA/SEDATION: Moderate (conscious) sedation was employed during this procedure. A total of Versed 3 mg and Fentanyl 100 mcg was administered intravenously. Moderate Sedation Time: 3 hours, 27 minutes. The patient's level of consciousness and vital signs were monitored continuously by radiology nursing throughout the procedure under my direct supervision. FLUOROSCOPY TIME:  72 minutes, 54 seconds (2,668 mGy) ACCESS: Right common femoral artery; hemostasis achieved with manual compression. COMPLICATIONS: None immediate. TECHNIQUE: Informed written consent was obtained from the patient after a discussion of the risks, benefits and alternatives to treatment. Questions regarding the procedure were encouraged and answered. A timeout was performed prior to the initiation of the procedure. The right groin was prepped and draped in the usual sterile fashion, and a sterile drape was applied covering the operative field. Maximum barrier sterile technique with sterile gowns and gloves were used for the procedure. A timeout was performed prior to the initiation of the procedure. Local anesthesia was provided with 1% lidocaine. The right femoral head was marked fluoroscopically. Under direct ultrasound guidance, the right common femoral artery was accessed with a micropuncture kit allowing placement of a of 5-French vascular sheath. An ultrasound image was saved for documentation purposes. A limited arteriogram performed through the side arm of the sheath confirming appropriate access within the right common femoral artery. Over a Bentson wire, the flush catheter was advanced to the level of the perirenal abdominal aorta for the catheter was back bled and flushed. Next, 2 flush abdominal aortogram performed in various obliquities Next, over a Bentson wire, the Omni Flush catheter was exchanged for a Mickelson catheter was utilized select the superior mesenteric  artery. Selective superior mesenteric arteriograms were performed in various obliquities. Initial attempts were made to cannulate the right colic artery with the use of a Asahi 0.014 microwire and a Engineer, materials, however this ultimately proved unsuccessful secondary to the patulous nature of the main trunk of the SMA. As such, over a Bentson wire, the Mickelson catheter was exchanged for a C2 catheter which was advanced further into the SMA regional to the origin of the right colic artery. Ultimately, the right colic artery was cannulated with a fathom-14 microwire and Echelon-14 microcatheter. Selective right colic arteriogram was performed. The microcatheter was advanced through the entirety of the right middle colic artery in through the marginal artery of Drummond past the left colic artery regional to the confluence with the IMA. Contrast injection was performed at this location. Next, with the use of a Transcend 14 microwire, the microcatheter was advanced retrograde into the IMA. Contrast injection confirmed appropriate positioning. Next, the microcatheter was advanced further into the native abdominal aortic aneurysm sac. Contrast injection confirmed appropriate positioning. Prolonged efforts were made to attempt to identify the inflow vessels to the aneurysm sac. Ultimately, the cranial aspect of the aneurysm was coiled with multiple overlapping ev3 framing and filling microcoils. Multiple contrast injection were performed throughout coil deployment. Ultimately, with the cranial aspect of the aneurysm sac embolized, the microcatheter was advanced into the caudal aspect of the aneurysm sac. Contrast injection was performed confirmed appropriate  positioning. Again, the caudal aspect of the native abdominal aortic aneurysm sac was embolized with multiple overlapping ev3 framing and filling microcoils. Multiple contrast injections were performed throughout coil deployment. Once the caudal aspect of  the aneurysm was successfully coil embolized microcatheter was flushed with a small amount of DMSO and approximately 1 vial of Onyx was administered under direct fluoroscopic guidance. The microcatheter was removed intact followed by removal of the C2 catheter. Postprocedural spot fluoroscopic radiographic images were obtained in various obliquities. Next, hemostasis achieved at the right groin access site with deployment of an Exoseal closure device as well as manual compression. A dressing was placed. The patient tolerated the procedure well without immediate postprocedural complication. FINDINGS: Flush abdominal aortogram was negative for definitive evidence of a type 1a endoleak. Superior mesenteric arteriogram demonstrates predominant arterial supply to the marginal artery of Drummond via the right middle colic artery. Right middle colic artery was selected with contrast injection confirming this finding. Successful traversal of the entirety of the marginal artery of Drummond and retrograde cannulation of the IMA. Contrast injection of the IMA demonstrates outflow from the excluded native abdominal aortic aneurysm sac. Contrast injection within the native abdominal aortic aneurysm sac demonstrates filling of the irregular endoleak as was demonstrated on delayed venous phase imaging of preceding abdominal CT. Ultimately, 4 discrete sources of type 2 endoleak inflow were identified: a tiny accessory right renal artery, a combined origin of the bilateral L4 lumbar arteries as well as the middle sacral artery. Following coil embolization of the cranial aspect of the aneurysm sac, inflow from the accessory right renal artery was no longer identified. Completion images demonstrate excellent coil embolization and Onyx casting of the middle sacral artery as well as the confluence of the bilateral L4 lumbar arteries with Onyx. IMPRESSION: Technically successful percutaneous coil and Onyx embolization of complex type 2  endoleak as detailed above. PLAN: - Patient will be seen in follow-up consultation in 3-4 weeks for postprocedural evaluation and management. - Initial postprocedural imaging will be obtained in approximately 3-6 months. Electronically Signed   By: Sandi Mariscal M.D.   On: 12/03/2017 16:54   Ir Angiogram Visceral Selective  Result Date: 12/03/2017 INDICATION: History of abdominal aortic aneurysm with slowly enlarging aneurysm sac secondary to a presumed type 2 endoleak via the IMA. Please refer to formal consultation in the epic EMR dated 11/08/2017 for additional details. EXAM: 1. FLUSH ABDOMINAL AORTOGRAM 2. SUPERIOR MESENTERIC ARTERIOGRAM 3. SELECTIVE MARGINAL ARTERY OF DRUMMOND ARTERIOGRAM 4. SELECTIVE INFERIOR MESENTERIC ARTERIOGRAM 5. SELECTED INJECTION OF NATIVE ABDOMINAL AORTIC ANEURYSM SAC WITH SUBSEQUENT COIL AND ONYX EMBOLIZATION. 6. ULTRASOUND GUIDED VASCULAR ACCESS OF THE RIGHT COMMON FEMORAL ARTERY COMPARISON:  CTA abdomen and pelvis - 11/08/2017 MEDICATIONS: None CONTRAST:  110 cc Isovue 300 (note, patient received standard steroid and Benadryl preprocedural prep given history of contrast reaction and tolerated the contrast without complication) ANESTHESIA/SEDATION: Moderate (conscious) sedation was employed during this procedure. A total of Versed 3 mg and Fentanyl 100 mcg was administered intravenously. Moderate Sedation Time: 3 hours, 27 minutes. The patient's level of consciousness and vital signs were monitored continuously by radiology nursing throughout the procedure under my direct supervision. FLUOROSCOPY TIME:  72 minutes, 54 seconds (2,668 mGy) ACCESS: Right common femoral artery; hemostasis achieved with manual compression. COMPLICATIONS: None immediate. TECHNIQUE: Informed written consent was obtained from the patient after a discussion of the risks, benefits and alternatives to treatment. Questions regarding the procedure were encouraged and answered. A timeout was performed prior to  the initiation of the procedure. The right groin was prepped and draped in the usual sterile fashion, and a sterile drape was applied covering the operative field. Maximum barrier sterile technique with sterile gowns and gloves were used for the procedure. A timeout was performed prior to the initiation of the procedure. Local anesthesia was provided with 1% lidocaine. The right femoral head was marked fluoroscopically. Under direct ultrasound guidance, the right common femoral artery was accessed with a micropuncture kit allowing placement of a of 5-French vascular sheath. An ultrasound image was saved for documentation purposes. A limited arteriogram performed through the side arm of the sheath confirming appropriate access within the right common femoral artery. Over a Bentson wire, the flush catheter was advanced to the level of the perirenal abdominal aorta for the catheter was back bled and flushed. Next, 2 flush abdominal aortogram performed in various obliquities Next, over a Bentson wire, the Omni Flush catheter was exchanged for a Mickelson catheter was utilized select the superior mesenteric artery. Selective superior mesenteric arteriograms were performed in various obliquities. Initial attempts were made to cannulate the right colic artery with the use of a Asahi 0.014 microwire and a Engineer, materials, however this ultimately proved unsuccessful secondary to the patulous nature of the main trunk of the SMA. As such, over a Bentson wire, the Mickelson catheter was exchanged for a C2 catheter which was advanced further into the SMA regional to the origin of the right colic artery. Ultimately, the right colic artery was cannulated with a fathom-14 microwire and Echelon-14 microcatheter. Selective right colic arteriogram was performed. The microcatheter was advanced through the entirety of the right middle colic artery in through the marginal artery of Drummond past the left colic artery regional to  the confluence with the IMA. Contrast injection was performed at this location. Next, with the use of a Transcend 14 microwire, the microcatheter was advanced retrograde into the IMA. Contrast injection confirmed appropriate positioning. Next, the microcatheter was advanced further into the native abdominal aortic aneurysm sac. Contrast injection confirmed appropriate positioning. Prolonged efforts were made to attempt to identify the inflow vessels to the aneurysm sac. Ultimately, the cranial aspect of the aneurysm was coiled with multiple overlapping ev3 framing and filling microcoils. Multiple contrast injection were performed throughout coil deployment. Ultimately, with the cranial aspect of the aneurysm sac embolized, the microcatheter was advanced into the caudal aspect of the aneurysm sac. Contrast injection was performed confirmed appropriate positioning. Again, the caudal aspect of the native abdominal aortic aneurysm sac was embolized with multiple overlapping ev3 framing and filling microcoils. Multiple contrast injections were performed throughout coil deployment. Once the caudal aspect of the aneurysm was successfully coil embolized microcatheter was flushed with a small amount of DMSO and approximately 1 vial of Onyx was administered under direct fluoroscopic guidance. The microcatheter was removed intact followed by removal of the C2 catheter. Postprocedural spot fluoroscopic radiographic images were obtained in various obliquities. Next, hemostasis achieved at the right groin access site with deployment of an Exoseal closure device as well as manual compression. A dressing was placed. The patient tolerated the procedure well without immediate postprocedural complication. FINDINGS: Flush abdominal aortogram was negative for definitive evidence of a type 1a endoleak. Superior mesenteric arteriogram demonstrates predominant arterial supply to the marginal artery of [place] via the right middle colic  artery. Right middle colic artery was selected with contrast injection confirming this finding. Successful traversal of the entirety of the marginal artery of [place] and retrograde cannulation of  the IMA. Contrast injection of the IMA demonstrates outflow from the excluded native abdominal aortic aneurysm sac. Contrast injection within the native abdominal aortic aneurysm sac demonstrates filling of the irregular endoleak as was demonstrated on delayed venous phase imaging of preceding abdominal CT. Ultimately, 4 discrete sources of type 2 endoleak inflow were identified: a tiny accessory right renal artery, a combined origin of the bilateral L4 lumbar arteries as well as the middle sacral artery. Following coil embolization of the cranial aspect of the aneurysm sac, inflow from the accessory right renal artery was no longer identified. Completion images demonstrate excellent coil embolization and Onyx casting of the middle sacral artery as well as the confluence of the bilateral L4 lumbar arteries with Onyx. IMPRESSION: Technically successful percutaneous coil and Onyx embolization of complex type 2 endoleak as detailed above. PLAN: - Patient will be seen in follow-up consultation in 3-4 weeks for postprocedural evaluation and management. - Initial postprocedural imaging will be obtained in approximately 3-6 months. Electronically Signed   By: Sandi Mariscal M.D.   On: 12/03/2017 16:54   Ir Angiogram Selective Each Additional Vessel  Result Date: 12/03/2017 INDICATION: History of abdominal aortic aneurysm with slowly enlarging aneurysm sac secondary to a presumed type 2 endoleak via the IMA. Please refer to formal consultation in the epic EMR dated 11/08/2017 for additional details. EXAM: 1. FLUSH ABDOMINAL AORTOGRAM 2. SUPERIOR MESENTERIC ARTERIOGRAM 3. SELECTIVE MARGINAL ARTERY OF [place] ARTERIOGRAM 4. SELECTIVE INFERIOR MESENTERIC ARTERIOGRAM 5. SELECTED INJECTION OF NATIVE ABDOMINAL AORTIC ANEURYSM SAC  WITH SUBSEQUENT COIL AND ONYX EMBOLIZATION. 6. ULTRASOUND GUIDED VASCULAR ACCESS OF THE RIGHT COMMON FEMORAL ARTERY COMPARISON:  CTA abdomen and pelvis - 11/08/2017 MEDICATIONS: None CONTRAST:  110 cc Isovue 300 (note, patient received standard steroid and Benadryl preprocedural prep given history of contrast reaction and tolerated the contrast without complication) ANESTHESIA/SEDATION: Moderate (conscious) sedation was employed during this procedure. A total of Versed 3 mg and Fentanyl 100 mcg was administered intravenously. Moderate Sedation Time: 3 hours, 27 minutes. The patient's level of consciousness and vital signs were monitored continuously by radiology nursing throughout the procedure under my direct supervision. FLUOROSCOPY TIME:  72 minutes, 54 seconds (2,668 mGy) ACCESS: Right common femoral artery; hemostasis achieved with manual compression. COMPLICATIONS: None immediate. TECHNIQUE: Informed written consent was obtained from the patient after a discussion of the risks, benefits and alternatives to treatment. Questions regarding the procedure were encouraged and answered. A timeout was performed prior to the initiation of the procedure. The right groin was prepped and draped in the usual sterile fashion, and a sterile drape was applied covering the operative field. Maximum barrier sterile technique with sterile gowns and gloves were used for the procedure. A timeout was performed prior to the initiation of the procedure. Local anesthesia was provided with 1% lidocaine. The right femoral head was marked fluoroscopically. Under direct ultrasound guidance, the right common femoral artery was accessed with a micropuncture kit allowing placement of a of 5-French vascular sheath. An ultrasound image was saved for documentation purposes. A limited arteriogram performed through the side arm of the sheath confirming appropriate access within the right common femoral artery. Over a Bentson wire, the flush  catheter was advanced to the level of the perirenal abdominal aorta for the catheter was back bled and flushed. Next, 2 flush abdominal aortogram performed in various obliquities Next, over a Bentson wire, the Omni Flush catheter was exchanged for a Mickelson catheter was utilized select the superior mesenteric artery. Selective superior mesenteric arteriograms were  performed in various obliquities. Initial attempts were made to cannulate the right colic artery with the use of a Asahi 0.014 microwire and a Engineer, materials, however this ultimately proved unsuccessful secondary to the patulous nature of the main trunk of the SMA. As such, over a Bentson wire, the Mickelson catheter was exchanged for a C2 catheter which was advanced further into the SMA regional to the origin of the right colic artery. Ultimately, the right colic artery was cannulated with a fathom-14 microwire and Echelon-14 microcatheter. Selective right colic arteriogram was performed. The microcatheter was advanced through the entirety of the right middle colic artery in through the marginal artery of [place] past the left colic artery regional to the confluence with the IMA. Contrast injection was performed at this location. Next, with the use of a Transcend 14 microwire, the microcatheter was advanced retrograde into the IMA. Contrast injection confirmed appropriate positioning. Next, the microcatheter was advanced further into the native abdominal aortic aneurysm sac. Contrast injection confirmed appropriate positioning. Prolonged efforts were made to attempt to identify the inflow vessels to the aneurysm sac. Ultimately, the cranial aspect of the aneurysm was coiled with multiple overlapping ev3 framing and filling microcoils. Multiple contrast injection were performed throughout coil deployment. Ultimately, with the cranial aspect of the aneurysm sac embolized, the microcatheter was advanced into the caudal aspect of the aneurysm  sac. Contrast injection was performed confirmed appropriate positioning. Again, the caudal aspect of the native abdominal aortic aneurysm sac was embolized with multiple overlapping ev3 framing and filling microcoils. Multiple contrast injections were performed throughout coil deployment. Once the caudal aspect of the aneurysm was successfully coil embolized microcatheter was flushed with a small amount of DMSO and approximately 1 vial of Onyx was administered under direct fluoroscopic guidance. The microcatheter was removed intact followed by removal of the C2 catheter. Postprocedural spot fluoroscopic radiographic images were obtained in various obliquities. Next, hemostasis achieved at the right groin access site with deployment of an Exoseal closure device as well as manual compression. A dressing was placed. The patient tolerated the procedure well without immediate postprocedural complication. FINDINGS: Flush abdominal aortogram was negative for definitive evidence of a type 1a endoleak. Superior mesenteric arteriogram demonstrates predominant arterial supply to the marginal artery of [place] via the right middle colic artery. Right middle colic artery was selected with contrast injection confirming this finding. Successful traversal of the entirety of the marginal artery of [place] and retrograde cannulation of the IMA. Contrast injection of the IMA demonstrates outflow from the excluded native abdominal aortic aneurysm sac. Contrast injection within the native abdominal aortic aneurysm sac demonstrates filling of the irregular endoleak as was demonstrated on delayed venous phase imaging of preceding abdominal CT. Ultimately, 4 discrete sources of type 2 endoleak inflow were identified: a tiny accessory right renal artery, a combined origin of the bilateral L4 lumbar arteries as well as the middle sacral artery. Following coil embolization of the cranial aspect of the aneurysm sac, inflow from the accessory  right renal artery was no longer identified. Completion images demonstrate excellent coil embolization and Onyx casting of the middle sacral artery as well as the confluence of the bilateral L4 lumbar arteries with Onyx. IMPRESSION: Technically successful percutaneous coil and Onyx embolization of complex type 2 endoleak as detailed above. PLAN: - Patient will be seen in follow-up consultation in 3-4 weeks for postprocedural evaluation and management. - Initial postprocedural imaging will be obtained in approximately 3-6 months. Electronically Signed   By: Sandi Mariscal  M.D.   On: 12/03/2017 16:54   Ir US Guide Vasc Access Right  Result Date: 12/03/2017 INDICATION: History of abdominal aortic aneurysm with slowly enlarging aneurysm sac secondary to a presumed type 2 endoleak via the IMA. Please refer to formal consultation in the epic EMR dated 11/08/2017 for additional details. EXAM: 1. FLUSH ABDOMINAL AORTOGRAM 2. SUPERIOR MESENTERIC ARTERIOGRAM 3. SELECTIVE MARGINAL ARTERY OF [place] ARTERIOGRAM 4. SELECTIVE INFERIOR MESENTERIC ARTERIOGRAM 5. SELECTED INJECTION OF NATIVE ABDOMINAL AORTIC ANEURYSM SAC WITH SUBSEQUENT COIL AND ONYX EMBOLIZATION. 6. ULTRASOUND GUIDED VASCULAR ACCESS OF THE RIGHT COMMON FEMORAL ARTERY COMPARISON:  CTA abdomen and pelvis - 11/08/2017 MEDICATIONS: None CONTRAST:  110 cc Isovue 300 (note, patient received standard steroid and Benadryl preprocedural prep given history of contrast reaction and tolerated the contrast without complication) ANESTHESIA/SEDATION: Moderate (conscious) sedation was employed during this procedure. A total of Versed 3 mg and Fentanyl 100 mcg was administered intravenously. Moderate Sedation Time: 3 hours, 27 minutes. The patient's level of consciousness and vital signs were monitored continuously by radiology nursing throughout the procedure under my direct supervision. FLUOROSCOPY TIME:  72 minutes, 54 seconds (2,668 mGy) ACCESS: Right common femoral artery;  hemostasis achieved with manual compression. COMPLICATIONS: None immediate. TECHNIQUE: Informed written consent was obtained from the patient after a discussion of the risks, benefits and alternatives to treatment. Questions regarding the procedure were encouraged and answered. A timeout was performed prior to the initiation of the procedure. The right groin was prepped and draped in the usual sterile fashion, and a sterile drape was applied covering the operative field. Maximum barrier sterile technique with sterile gowns and gloves were used for the procedure. A timeout was performed prior to the initiation of the procedure. Local anesthesia was provided with 1% lidocaine. The right femoral head was marked fluoroscopically. Under direct ultrasound guidance, the right common femoral artery was accessed with a micropuncture kit allowing placement of a of 5-French vascular sheath. An ultrasound image was saved for documentation purposes. A limited arteriogram performed through the side arm of the sheath confirming appropriate access within the right common femoral artery. Over a Bentson wire, the flush catheter was advanced to the level of the perirenal abdominal aorta for the catheter was back bled and flushed. Next, 2 flush abdominal aortogram performed in various obliquities Next, over a Bentson wire, the Omni Flush catheter was exchanged for a Mickelson catheter was utilized select the superior mesenteric artery. Selective superior mesenteric arteriograms were performed in various obliquities. Initial attempts were made to cannulate the right colic artery with the use of a Asahi 0.014 microwire and a Engineer, materials, however this ultimately proved unsuccessful secondary to the patulous nature of the main trunk of the SMA. As such, over a Bentson wire, the Mickelson catheter was exchanged for a C2 catheter which was advanced further into the SMA regional to the origin of the right colic artery.  Ultimately, the right colic artery was cannulated with a fathom-14 microwire and Echelon-14 microcatheter. Selective right colic arteriogram was performed. The microcatheter was advanced through the entirety of the right middle colic artery in through the marginal artery of [place] past the left colic artery regional to the confluence with the IMA. Contrast injection was performed at this location. Next, with the use of a Transcend 14 microwire, the microcatheter was advanced retrograde into the IMA. Contrast injection confirmed appropriate positioning. Next, the microcatheter was advanced further into the native abdominal aortic aneurysm sac. Contrast injection confirmed appropriate positioning. Prolonged efforts were made to  attempt to identify the inflow vessels to the aneurysm sac. Ultimately, the cranial aspect of the aneurysm was coiled with multiple overlapping ev3 framing and filling microcoils. Multiple contrast injection were performed throughout coil deployment. Ultimately, with the cranial aspect of the aneurysm sac embolized, the microcatheter was advanced into the caudal aspect of the aneurysm sac. Contrast injection was performed confirmed appropriate positioning. Again, the caudal aspect of the native abdominal aortic aneurysm sac was embolized with multiple overlapping ev3 framing and filling microcoils. Multiple contrast injections were performed throughout coil deployment. Once the caudal aspect of the aneurysm was successfully coil embolized microcatheter was flushed with a small amount of DMSO and approximately 1 vial of Onyx was administered under direct fluoroscopic guidance. The microcatheter was removed intact followed by removal of the C2 catheter. Postprocedural spot fluoroscopic radiographic images were obtained in various obliquities. Next, hemostasis achieved at the right groin access site with deployment of an Exoseal closure device as well as manual compression. A dressing was  placed. The patient tolerated the procedure well without immediate postprocedural complication. FINDINGS: Flush abdominal aortogram was negative for definitive evidence of a type 1a endoleak. Superior mesenteric arteriogram demonstrates predominant arterial supply to the marginal artery of [place] via the right middle colic artery. Right middle colic artery was selected with contrast injection confirming this finding. Successful traversal of the entirety of the marginal artery of [place] and retrograde cannulation of the IMA. Contrast injection of the IMA demonstrates outflow from the excluded native abdominal aortic aneurysm sac. Contrast injection within the native abdominal aortic aneurysm sac demonstrates filling of the irregular endoleak as was demonstrated on delayed venous phase imaging of preceding abdominal CT. Ultimately, 4 discrete sources of type 2 endoleak inflow were identified: a tiny accessory right renal artery, a combined origin of the bilateral L4 lumbar arteries as well as the middle sacral artery. Following coil embolization of the cranial aspect of the aneurysm sac, inflow from the accessory right renal artery was no longer identified. Completion images demonstrate excellent coil embolization and Onyx casting of the middle sacral artery as well as the confluence of the bilateral L4 lumbar arteries with Onyx. IMPRESSION: Technically successful percutaneous coil and Onyx embolization of complex type 2 endoleak as detailed above. PLAN: - Patient will be seen in follow-up consultation in 3-4 weeks for postprocedural evaluation and management. - Initial postprocedural imaging will be obtained in approximately 3-6 months. Electronically Signed   By: Sandi Mariscal M.D.   On: 12/03/2017 16:54   Ir Embo Arterial Not Weld Guide Roadmapping  Result Date: 12/03/2017 INDICATION: History of abdominal aortic aneurysm with slowly enlarging aneurysm sac secondary to a presumed type 2 endoleak  via the IMA. Please refer to formal consultation in the epic EMR dated 11/08/2017 for additional details. EXAM: 1. FLUSH ABDOMINAL AORTOGRAM 2. SUPERIOR MESENTERIC ARTERIOGRAM 3. SELECTIVE MARGINAL ARTERY OF [place] ARTERIOGRAM 4. SELECTIVE INFERIOR MESENTERIC ARTERIOGRAM 5. SELECTED INJECTION OF NATIVE ABDOMINAL AORTIC ANEURYSM SAC WITH SUBSEQUENT COIL AND ONYX EMBOLIZATION. 6. ULTRASOUND GUIDED VASCULAR ACCESS OF THE RIGHT COMMON FEMORAL ARTERY COMPARISON:  CTA abdomen and pelvis - 11/08/2017 MEDICATIONS: None CONTRAST:  110 cc Isovue 300 (note, patient received standard steroid and Benadryl preprocedural prep given history of contrast reaction and tolerated the contrast without complication) ANESTHESIA/SEDATION: Moderate (conscious) sedation was employed during this procedure. A total of Versed 3 mg and Fentanyl 100 mcg was administered intravenously. Moderate Sedation Time: 3 hours, 27 minutes. The patient's level of consciousness and vital signs were monitored  continuously by radiology nursing throughout the procedure under my direct supervision. FLUOROSCOPY TIME:  72 minutes, 54 seconds (2,668 mGy) ACCESS: Right common femoral artery; hemostasis achieved with manual compression. COMPLICATIONS: None immediate. TECHNIQUE: Informed written consent was obtained from the patient after a discussion of the risks, benefits and alternatives to treatment. Questions regarding the procedure were encouraged and answered. A timeout was performed prior to the initiation of the procedure. The right groin was prepped and draped in the usual sterile fashion, and a sterile drape was applied covering the operative field. Maximum barrier sterile technique with sterile gowns and gloves were used for the procedure. A timeout was performed prior to the initiation of the procedure. Local anesthesia was provided with 1% lidocaine. The right femoral head was marked fluoroscopically. Under direct ultrasound guidance, the right common  femoral artery was accessed with a micropuncture kit allowing placement of a of 5-French vascular sheath. An ultrasound image was saved for documentation purposes. A limited arteriogram performed through the side arm of the sheath confirming appropriate access within the right common femoral artery. Over a Bentson wire, the flush catheter was advanced to the level of the perirenal abdominal aorta for the catheter was back bled and flushed. Next, 2 flush abdominal aortogram performed in various obliquities Next, over a Bentson wire, the Omni Flush catheter was exchanged for a Mickelson catheter was utilized select the superior mesenteric artery. Selective superior mesenteric arteriograms were performed in various obliquities. Initial attempts were made to cannulate the right colic artery with the use of a Asahi 0.014 microwire and a Engineer, materials, however this ultimately proved unsuccessful secondary to the patulous nature of the main trunk of the SMA. As such, over a Bentson wire, the Mickelson catheter was exchanged for a C2 catheter which was advanced further into the SMA regional to the origin of the right colic artery. Ultimately, the right colic artery was cannulated with a fathom-14 microwire and Echelon-14 microcatheter. Selective right colic arteriogram was performed. The microcatheter was advanced through the entirety of the right middle colic artery in through the marginal artery of [place] past the left colic artery regional to the confluence with the IMA. Contrast injection was performed at this location. Next, with the use of a Transcend 14 microwire, the microcatheter was advanced retrograde into the IMA. Contrast injection confirmed appropriate positioning. Next, the microcatheter was advanced further into the native abdominal aortic aneurysm sac. Contrast injection confirmed appropriate positioning. Prolonged efforts were made to attempt to identify the inflow vessels to the aneurysm sac.  Ultimately, the cranial aspect of the aneurysm was coiled with multiple overlapping ev3 framing and filling microcoils. Multiple contrast injection were performed throughout coil deployment. Ultimately, with the cranial aspect of the aneurysm sac embolized, the microcatheter was advanced into the caudal aspect of the aneurysm sac. Contrast injection was performed confirmed appropriate positioning. Again, the caudal aspect of the native abdominal aortic aneurysm sac was embolized with multiple overlapping ev3 framing and filling microcoils. Multiple contrast injections were performed throughout coil deployment. Once the caudal aspect of the aneurysm was successfully coil embolized microcatheter was flushed with a small amount of DMSO and approximately 1 vial of Onyx was administered under direct fluoroscopic guidance. The microcatheter was removed intact followed by removal of the C2 catheter. Postprocedural spot fluoroscopic radiographic images were obtained in various obliquities. Next, hemostasis achieved at the right groin access site with deployment of an Exoseal closure device as well as manual compression. A dressing was placed. The patient tolerated  the procedure well without immediate postprocedural complication. FINDINGS: Flush abdominal aortogram was negative for definitive evidence of a type 1a endoleak. Superior mesenteric arteriogram demonstrates predominant arterial supply to the marginal artery of [place] via the right middle colic artery. Right middle colic artery was selected with contrast injection confirming this finding. Successful traversal of the entirety of the marginal artery of [place] and retrograde cannulation of the IMA. Contrast injection of the IMA demonstrates outflow from the excluded native abdominal aortic aneurysm sac. Contrast injection within the native abdominal aortic aneurysm sac demonstrates filling of the irregular endoleak as was demonstrated on delayed venous phase  imaging of preceding abdominal CT. Ultimately, 4 discrete sources of type 2 endoleak inflow were identified: a tiny accessory right renal artery, a combined origin of the bilateral L4 lumbar arteries as well as the middle sacral artery. Following coil embolization of the cranial aspect of the aneurysm sac, inflow from the accessory right renal artery was no longer identified. Completion images demonstrate excellent coil embolization and Onyx casting of the middle sacral artery as well as the confluence of the bilateral L4 lumbar arteries with Onyx. IMPRESSION: Technically successful percutaneous coil and Onyx embolization of complex type 2 endoleak as detailed above. PLAN: - Patient will be seen in follow-up consultation in 3-4 weeks for postprocedural evaluation and management. - Initial postprocedural imaging will be obtained in approximately 3-6 months. Electronically Signed   By: Sandi Mariscal M.D.   On: 12/03/2017 16:54    Impression/Plan: 1. Recurrent Stage IIIA, T2a, N2, M0, NSCLC, adenocarcinoma of the left upper lobe. Mr. Unrein appears to be doing well radiographically and clinically. We discussed his MRI results today in the office which demonstrate continued stability and no new lesions.  After reviewing his scans, we could consider discontinuing his radionecrosis regimen, but would be cautious in doing so one drug at a time. He is in agreement and we will follow this expectantly. He remains in observation with Dr. Julien Nordmann as well and we will see him back following his next 4 month MRI. The patient will contact us with questions or concerns prior to his next visi 2. Seizure at presentation. He will continue Lamictal under the care of Dr. Delice Lesch.  3. Radionecrosis. As per #1. We can consider discontinuing his Vitamin E since he just filled his Trental. He is in agreement. 4. Elevated PSA. I encouraged him to proceed with evaluation with [facility] and discussed that if he has high risk disease,  he may need ADT and XRT with Dr. Tammi Klippel.     Carola Rhine, PAC

## 2017-12-25 NOTE — Addendum Note (Signed)
Encounter addended by: Malena Edman, RN on: 12/25/2017 9:23 AM  Actions taken: Charge Capture section accepted

## 2018-01-01 ENCOUNTER — Ambulatory Visit
Admission: RE | Admit: 2018-01-01 | Discharge: 2018-01-01 | Disposition: A | Discharge status: Home / Self Care | Payer: Medicare Other | Source: Ambulatory Visit | Attending: Interventional Radiology | Admitting: Interventional Radiology

## 2018-01-01 ENCOUNTER — Encounter: Payer: Self-pay | Admitting: Radiology

## 2018-01-01 DIAGNOSIS — Z9889 Other specified postprocedural states: Secondary | ICD-10-CM | POA: Diagnosis not present

## 2018-01-01 DIAGNOSIS — Z85118 Personal history of other malignant neoplasm of bronchus and lung: Secondary | ICD-10-CM | POA: Diagnosis not present

## 2018-01-01 DIAGNOSIS — IMO0002 Reserved for concepts with insufficient information to code with codable children: Secondary | ICD-10-CM

## 2018-01-01 DIAGNOSIS — T82330A Leakage of aortic (bifurcation) graft (replacement), initial encounter: Secondary | ICD-10-CM

## 2018-01-01 DIAGNOSIS — Z8679 Personal history of other diseases of the circulatory system: Secondary | ICD-10-CM | POA: Diagnosis not present

## 2018-01-01 HISTORY — PX: IR RADIOLOGIST EVAL & MGMT: IMG5224

## 2018-01-01 NOTE — Progress Notes (Signed)
Patient ID: Jordan Mccullough, male   DOB: 21-Jan-1941, 77 y.o.   MRN: [id number]         Chief Complaint: Post AAA Endoleak Repair  Referring Physician(s): Brabham (Vascular Surgery) Mohamed (Oncology)  History of Present Illness:  Jordan Mccullough is a 77 y.o. male with complex past medical history significant for smoking, hypertension, hyperlipidemia, CAD and lung cancer (with brain metastasis though remains clinically stable without evidence of disease progression) who initially underwent a technically successful repair of infrarenal abdominal aortic aneurysm by Dr. Trula Slade on 06/08/2014.  Patient was initially seen in consultation on 08/02/2016 for evaluation of potential her treatment options for a endoleak, however at that time it was decided to pursue conservative management given lack of significant growth of the native sac of the abdominal aortic aneurysm.  Unfortunately, personal review of dedicated post stent repair CTA performed 11/18/2017 demonstrated slight increase in size of excluded native abdominal aortic aneurysm sac, currently measuring 5.3 x 5.6 x 5.6 cm as measured in greatest oblique short axis axial (image 61, series 6), coronal (image 63, series 12) and sagital (image 82, series 10) dimensions, previously, 5.2 x 5.2 x 5.6 cm when compared to the 07/28/2016 examination and approximately 4.9 x 5.2 x 5.3 cm when compared to the 06/08/2014 examination.  The complex endoleak was favored to be predominantly classified as at type II with supply by a combination of the IMA, a tiny accessory right renal artery as well as bilateral L3 lumbar arteries and given the continued (albeit) slow growth of the native abdominal aortic sac, a technically successful mesenteric arteriogram and endoleak repair was performed on 12/03/2017.  Patient returns to the interventional radiology clinic for post residual evaluation and management.  He is unaccompanied and serves as his own historian.  Patient  is currently without complaint.  Specifically, no abdominal pain.  No groin access site issues.  No fever or chills.      Past Medical History:  Diagnosis Date  . AAA (abdominal aortic aneurysm) (HCC)    s/p repair (aortoiliac bipass; with persistent endograft leak in inferior mesenteric artery)--stable as of 01/2017 vascular f/u.  Marland Kitchen Asymptomatic cholelithiasis 07/2015   Incidental finding on PET CT  . Back pain 04/19/2016  . BPH with elevated PSA    PSA signif rise 11/2017--recommended pt see his urologist again.  . Coronary artery disease    MI 1992, S/P  PTCA; negative stress test in November 2011 with no ischemia.   . Diverticulosis   . Hearing loss    Bilateral   . History of radiation therapy 11/10/13-12/12/13   lung,50Gy/61f  . Hyperlipidemia    Crestor= myalgias  . Hypertension   . Lung cancer ([facility] 05/2013   non-small cell;  L upper lobectomy with mediastinal LN dissection, chemo, and radiation in 2014/2015. Remission until pt had questionable seizure 07/2015--MRI showed brain mets; palliative brain rad (stereotactic radiation therapy) started 08/25/15.  CT C/A/P clear 02/2016, 05/2016, and 11/2016.  Plan per onc is to repeat in 6 mo.  . Malignant ne[place] of upper lobe, left bronchus or lung ([facility] 10/29/2013   ?[facility] 4.5[facility]  Dr. MJulien Nordmann(onc) recommended f/u CXR after abx course.  If mass unchanged then repeat CT chest.  . Malignant [place] of upper lobe, [place] bronchus or lung ([place] 06/26/2013  . Myocardial infarction ([facility] 1992   Dr MAngelena Form   . Peripheral vascular  disease ([place]) 08/2012   4.8x4.6 cm infrarenal abdominal aortic fusiform aneurysm, 1.5 cm right common iliac artery aneurysm.  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  . S/P radiation therapy [facility] 08/25/15   Stereotactic radiation  therapy: frontoparietal 18gy,posterior frontal lobe 20gy,  . Seizure disorder ([place]) 2016/2017   as sequela of brain mets;  Grand mal seizure 07/2015, then got on keppra and was seizure-free until focal motor seizures of L side of face began 02/2016- these responded well to up-titration of keppra but pt had adverse side effects so eventually pt had to be switched over to lamictal 07/2016.  Marland Kitchen Shortness of breath   . Tobacco dependence    "Quit" 1992, but pt has smoked "on and off" since that time    Past Surgical History:  Procedure Laterality Date  . ABDOMINAL AORTIC ENDOVASCULAR STENT GRAFT N/A 05/07/2014   Procedure: ABDOMINAL AORTIC ENDOVASCULAR STENT GRAFT;  Surgeon: Serafina Mitchell, MD;  Location: [place] OR;  Service: Vascular;  Laterality: N/A;  . Carotid duplex dopplers  07/2015   1-39% on R, no signif dz noted on L  . COLONOSCOPY W/ POLYPECTOMY  2003    negative 2010,due 2020; Dr Olevia Perches  . CORONARY ANGIOPLASTY     no stents  . CYSTOSCOPY/RETROGRADE/URETEROSCOPY Bilateral 10/09/2012   Procedure: BILATERAL RETROGRADE bladder and urethral BIOPSY ;  Surgeon: Molli Hazard, MD;  Location: [facility];  Service: Urology;  Laterality: Bilateral;  BILATERAL RETROGRADE   . EEG  08/05/15   Pt placed on keppra just prior to this test due to having ? seizure (MRI showed brain mets)  . HERNIA REPAIR Bilateral    Inguinal  . INGUINAL HERIIORRHAPHY BILATERALLY    . IR ANGIOGRAM SELECTIVE EACH ADDITIONAL VESSEL  12/03/2017  . IR ANGIOGRAM VISCERAL SELECTIVE  12/03/2017  . IR ANGIOGRAM VISCERAL SELECTIVE  12/03/2017  . IR EMBO ARTERIAL NOT HEMORR HEMANG INC GUIDE ROADMAPPING  12/03/2017  . IR GENERIC HISTORICAL  07/20/2016   IR RADIOLOGIST EVAL & MGMT 07/20/2016 [facility] INTERV RAD  . IR GENERIC HISTORICAL  08/02/2016   IR RADIOLOGIST EVAL & MGMT 08/02/2016 Sandi Mariscal, MD [facility] INTERV RAD  . IR RADIOLOGIST EVAL & MGMT  11/08/2017  . IR US GUIDE VASC ACCESS RIGHT  12/03/2017  . MEDIASTINOSCOPY N/A  05/01/2013   Procedure: MEDIASTINOSCOPY;  Surgeon: Gaye Pollack, MD;  Location: [facility] OR;  Service: Thoracic;  Laterality: N/A;  . PFTs  04/2013   Minimal obstructive airway disease  . PILONIDAL CYST EXCISION    . PROSTATE BIOPSY N/A 10/09/2012   Procedure: PROSTATIC URETHRAL BIOPSY--BPH--no evidence of malignancy;  Surgeon: Molli Hazard, MD;  Location: [facility];  Service: Urology;  Laterality: N/A;  PROSTATIC URETHRAL BIOPSY  . PTCA  1992  . ROTATOR CUFF REPAIR     Bilateral  . THOROCOTOMY WITH LOBECTOMY Left 05/29/2013   Procedure: LEFT THOROCOTOMY WITH LEFT UPPER LOBE LOBECTOMY;  Surgeon: Gaye Pollack, MD;  Location: [facility];  Service: Thoracic;  Laterality: Left;  Marland Kitchen VIDEO BRONCHOSCOPY N/A 05/01/2013   Procedure: VIDEO BRONCHOSCOPY;  Surgeon: Gaye Pollack, MD;  Location: [facility] OR;  Service: Thoracic;  Laterality: N/A;    Allergies: Fluvirin [influenza vac split quad]; Rosuvastatin; and Iodinated diagnostic agents  Medications: Prior to Admission medications   Medication Sig Start Date End Date Taking? Authorizing Provider  amLODipine (NORVASC) 5 MG tablet TAKE 1 TABLET BY MOUTH  EVERY DAY 09/07/17   Tammi Sou, MD  aspirin EC 81 MG tablet Take 81 mg by mouth at bedtime.     [provider]  atorvastatin (LIPITOR) 20 MG tablet TAKE 1 TABLET (20 MG TOTAL) BY MOUTH DAILY. NEEDS APPOINTMENT FOR FURTHER REFILLS. 12/17/17   McGowen, Adrian Blackwater, MD  budesonide-formoterol (SYMBICORT) 160-4.5 MCG/ACT inhaler INHALE 1-2 PUFFS INTO THE LINGS EVERY 12 HOURS. GARGLE AND SPIT AFTER USE. 08/07/17   McGowen, Adrian Blackwater, MD  diphenhydrAMINE (BENADRYL) 50 MG tablet Take 1 tablet (50 mg total) by mouth as directed. Take 1 tablet (50 mg) by mouth 1 hr prior to CTA (scheduled for 11/08/2017) Patient not taking: Reported on 12/25/2017 10/23/17   Sandi Mariscal, MD  lamoTRIgine (LAMICTAL) 200 MG tablet TAKE 1 TABLET BY MOUTH IN THE MORNING. TAKE 1.5 TABLETS BY MOUTH IN THE EVENING Patient taking  differently: Take 200 mg by mouth twice daily 11/19/17   Cameron Sprang, MD  metoprolol tartrate (LOPRESSOR) 50 MG tablet TAKE 1 TABLET BY MOUTH TWICE A DAY 09/19/17   McGowen, Adrian Blackwater, MD  Multiple Vitamin (MULTIVITAMIN) tablet Take 1 tablet by mouth daily.     [provider]  pentoxifylline (TRENTAL) 400 MG CR tablet Take 1 tablet (400 mg total) by mouth 3 (three) times daily. 12/06/15   Hayden Pedro, PA-C  predniSONE (DELTASONE) 50 MG tablet Take 1 tablet (50 mg total) by mouth as directed. Take Prednisone 50 mg by mouth 13 hrs, 7 hrs and 1 hr prior to CTA (scheduled 11/08/2017). 11/07/17   Sandi Mariscal, MD  vitamin E 1000 UNIT capsule Take 1 capsule (1,000 Units total) by mouth daily. 12/06/15   Hayden Pedro, PA-C     Family History  Problem Relation Age of Onset  . Hypertension Mother   . Prostate cancer Father        Prostate cancer  . Cancer Father        Brain Tumor  . Lung cancer Sister        NON SMOKER  . Cancer Sister   . Hyperlipidemia Sister   . Hyperlipidemia Brother   . Heart attack Brother   . Hypertension Brother   . Prostate cancer Brother   . Diabetes Neg Hx   . Stroke Neg Hx     Social History   Socioeconomic History  . Marital status: Married    Spouse name: Not on file  . Number of children: Not on file  . Years of education: Not on file  . Highest education level: Not on file  Occupational History  . Occupation: Retired Scientist, clinical (histocompatibility and immunogenetics)  . Financial resource strain: Not on file  . Food insecurity:    Worry: Not on file    Inability: Not on file  . Transportation needs:    Medical: Not on file    Non-medical: Not on file  Tobacco Use  . Smoking status: Current Every Day Smoker    Packs/day: 1.00    Years: 24.00    Pack years: 24.00    Types: Cigarettes    Last attempt to quit: 05/28/2013    Years since quitting: 4.6  . Smokeless tobacco: Never Used  . Tobacco comment: QUIT 15 YEARS AGO-07/03/17 smoking    Substance and Sexual Activity  . Alcohol use: Yes    Alcohol/week: 9.0 oz    Types: 3 Glasses of wine, 12 Cans of beer per week    Comment: 1-2 beers a day  .  Drug use: No  . Sexual activity: Not on file  Lifestyle  . Physical activity:    Days per week: Not on file    Minutes per session: Not on file  . Stress: Not on file  Relationships  . Social connections:    Talks on phone: Not on file    Gets together: Not on file    Attends religious service: Not on file    Active member of club or organization: Not on file    Attends meetings of clubs or organizations: Not on file    Relationship status: Not on file  Other Topics Concern  . Not on file  Social History Narrative   HEART HEALTHY DIET.     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   MARRIED, 2 children who live in [place].  Smoked on and off since then.   ALCOHOL USE -YES- RED WINE SOCIALLY, couple of beers at night usually.    ECOG Status: 0 - Asymptomatic  Review of Systems: A 12 point ROS discussed and pertinent positives are indicated in the HPI above.  All other systems are negative.  Review of Systems  Constitutional: Negative for activity change and unexpected weight change.  Gastrointestinal: Negative.     Vital Signs: BP 124/71   Pulse 70   Temp 98.2 F (36.8 C) (Oral)   Resp 14   Ht 5' 3" (1.6 m)   Wt 145 lb (65.8 kg)   SpO2 97%   BMI 25.69 kg/m   Physical Exam  Constitutional: He appears well-developed and well-nourished.  HENT:  Head: Normocephalic and atraumatic.  Skin: Skin is warm and dry.  Psychiatric: He has a normal mood and affect. His behavior is normal. Thought content normal.    Imaging:  Post EVAR CTA abdomen and pelvis - 11/08/2017 Mesenteric arteriogram and endoleak repair - 12/03/2017  Mr Brain W Wo Contrast  Result Date: 12/20/2017 CLINICAL DATA:  Metastatic lung cancer.  Brain radiation 2017 EXAM: MRI HEAD WITHOUT AND WITH CONTRAST TECHNIQUE: Multiplanar,  multiecho pulse sequences of the brain and surrounding structures were obtained without and with intravenous contrast. CONTRAST:  46m MULTIHANCE GADOBENATE DIMEGLUMINE 529 MG/ML IV SOLN COMPARISON:  MRI head 08/23/2017 FINDINGS: Brain: Right parietal hemorrhagic lesion is unchanged. This has evidence of chronic hemorrhage in rim enhancement. The enhancing lesion measures 19 x 10 mm, unchanged High right parietal enhancing lesion with evidence of chronic hemorrhage also unchanged. This measures 6 x 12 mm on axial images. FLAIR hyperintensity in the white matter surrounding both lesions is unchanged. Scattered small white matter hyperintensities bilaterally do not enhance and are most likely due to chronic ischemia. Negative for acute infarct. Generalized atrophy without hydrocephalus. Vascular: Normal arterial flow void Skull and upper cervical spine: Negative Sinuses/Orbits: Negative Other: None IMPRESSION: Two treated lesions in the right parietal lobe are stable. No new lesions Atrophy and chronic microvascular ischemia in the white matter. Electronically Signed   By: CFranchot GalloM.D.   On: 12/20/2017 14:41   Ir Angiogram Visceral Selective  Result Date: 12/04/2017 INDICATION: History of abdominal aortic aneurysm with slowly enlarging aneurysm sac secondary to a presumed type 2 endoleak via the IMA. Please refer to formal consultation in the epic EMR dated 11/08/2017 for additional details. EXAM: 1. FLUSH ABDOMINAL AORTOGRAM 2. SUPERIOR MESENTERIC ARTERIOGRAM 3. SELECTIVE MARGINAL ARTERY OF DRUMMOND ARTERIOGRAM 4. SELECTIVE INFERIOR MESENTERIC ARTERIOGRAM 5. SELECTED INJECTION OF NATIVE ABDOMINAL AORTIC ANEURYSM SAC WITH SUBSEQUENT COIL AND ONYX EMBOLIZATION. 6.  ULTRASOUND GUIDED VASCULAR ACCESS OF THE RIGHT COMMON FEMORAL ARTERY COMPARISON:  CTA abdomen and pelvis - 11/08/2017 MEDICATIONS: None CONTRAST:  110 cc Isovue 300 (note, patient received standard steroid and Benadryl preprocedural prep given  history of contrast reaction and tolerated the contrast without complication) ANESTHESIA/SEDATION: Moderate (conscious) sedation was employed during this procedure. A total of Versed 3 mg and Fentanyl 100 mcg was administered intravenously. Moderate Sedation Time: 3 hours, 27 minutes. The patient's level of consciousness and vital signs were monitored continuously by radiology nursing throughout the procedure under my direct supervision. FLUOROSCOPY TIME:  72 minutes, 54 seconds (2,668 mGy) ACCESS: Right common femoral artery; hemostasis achieved with manual compression. COMPLICATIONS: None immediate. TECHNIQUE: Informed written consent was obtained from the patient after a discussion of the risks, benefits and alternatives to treatment. Questions regarding the procedure were encouraged and answered. A timeout was performed prior to the initiation of the procedure. The right groin was prepped and draped in the usual sterile fashion, and a sterile drape was applied covering the operative field. Maximum barrier sterile technique with sterile gowns and gloves were used for the procedure. A timeout was performed prior to the initiation of the procedure. Local anesthesia was provided with 1% lidocaine. The right femoral head was marked fluoroscopically. Under direct ultrasound guidance, the right common femoral artery was accessed with a micropuncture kit allowing placement of a of 5-French vascular sheath. An ultrasound image was saved for documentation purposes. A limited arteriogram performed through the side arm of the sheath confirming appropriate access within the right common femoral artery. Over a Bentson wire, the flush catheter was advanced to the level of the perirenal abdominal aorta for the catheter was back bled and flushed. Next, 2 flush abdominal aortogram performed in various obliquities Next, over a Bentson wire, the Omni Flush catheter was exchanged for a Mickelson catheter was utilized select the  superior mesenteric artery. Selective superior mesenteric arteriograms were performed in various obliquities. Initial attempts were made to cannulate the right colic artery with the use of a Asahi 0.014 microwire and a Engineer, materials, however this ultimately proved unsuccessful secondary to the patulous nature of the main trunk of the SMA. As such, over a Bentson wire, the Mickelson catheter was exchanged for a C2 catheter which was advanced further into the SMA regional to the origin of the right colic artery. Ultimately, the right colic artery was cannulated with a fathom-14 microwire and Echelon-14 microcatheter. Selective right colic arteriogram was performed. The microcatheter was advanced through the entirety of the right middle colic artery in through the marginal artery of Drummond past the left colic artery regional to the confluence with the IMA. Contrast injection was performed at this location. Next, with the use of a Transcend 14 microwire, the microcatheter was advanced retrograde into the IMA. Contrast injection confirmed appropriate positioning. Next, the microcatheter was advanced further into the native abdominal aortic aneurysm sac. Contrast injection confirmed appropriate positioning. Prolonged efforts were made to attempt to identify the inflow vessels to the aneurysm sac. Ultimately, the cranial aspect of the aneurysm was coiled with multiple overlapping ev3 framing and filling microcoils. Multiple contrast injection were performed throughout coil deployment. Ultimately, with the cranial aspect of the aneurysm sac embolized, the microcatheter was advanced into the caudal aspect of the aneurysm sac. Contrast injection was performed confirmed appropriate positioning. Again, the caudal aspect of the native abdominal aortic aneurysm sac was embolized with multiple overlapping ev3 framing and filling microcoils. Multiple contrast injections were performed  throughout coil deployment. Once  the caudal aspect of the aneurysm was successfully coil embolized microcatheter was flushed with a small amount of DMSO and approximately 1 vial of Onyx was administered under direct fluoroscopic guidance. The microcatheter was removed intact followed by removal of the C2 catheter. Postprocedural spot fluoroscopic radiographic images were obtained in various obliquities. Next, hemostasis achieved at the right groin access site with deployment of an Exoseal closure device as well as manual compression. A dressing was placed. The patient tolerated the procedure well without immediate postprocedural complication. FINDINGS: Flush abdominal aortogram was negative for definitive evidence of a type 1a endoleak. Superior mesenteric arteriogram demonstrates predominant arterial supply to the marginal artery of Drummond via the right middle colic artery. Right middle colic artery was selected with contrast injection confirming this finding. Successful traversal of the entirety of the marginal artery of Drummond and retrograde cannulation of the IMA. Contrast injection of the IMA demonstrates outflow from the excluded native abdominal aortic aneurysm sac. Contrast injection within the native abdominal aortic aneurysm sac demonstrates filling of the irregular endoleak as was demonstrated on delayed venous phase imaging of preceding abdominal CT. Ultimately, 4 discrete sources of type 2 endoleak inflow were identified: a tiny accessory right renal artery, a combined origin of the bilateral L4 lumbar arteries as well as the middle sacral artery. Following coil embolization of the cranial aspect of the aneurysm sac, inflow from the accessory right renal artery was no longer identified. Completion images demonstrate excellent coil embolization and Onyx casting of the middle sacral artery as well as the confluence of the bilateral L4 lumbar arteries with Onyx. IMPRESSION: Technically successful percutaneous coil and Onyx  embolization of complex type 2 endoleak as detailed above. PLAN: - Patient will be seen in follow-up consultation in 3-4 weeks for postprocedural evaluation and management. - Initial postprocedural imaging will be obtained in approximately 3-6 months. Electronically Signed   By: Sandi Mariscal M.D.   On: 12/03/2017 16:54   Ir Angiogram Visceral Selective  Result Date: 12/03/2017 INDICATION: History of abdominal aortic aneurysm with slowly enlarging aneurysm sac secondary to a presumed type 2 endoleak via the IMA. Please refer to formal consultation in the epic EMR dated 11/08/2017 for additional details. EXAM: 1. FLUSH ABDOMINAL AORTOGRAM 2. SUPERIOR MESENTERIC ARTERIOGRAM 3. SELECTIVE MARGINAL ARTERY OF DRUMMOND ARTERIOGRAM 4. SELECTIVE INFERIOR MESENTERIC ARTERIOGRAM 5. SELECTED INJECTION OF NATIVE ABDOMINAL AORTIC ANEURYSM SAC WITH SUBSEQUENT COIL AND ONYX EMBOLIZATION. 6. ULTRASOUND GUIDED VASCULAR ACCESS OF THE RIGHT COMMON FEMORAL ARTERY COMPARISON:  CTA abdomen and pelvis - 11/08/2017 MEDICATIONS: None CONTRAST:  110 cc Isovue 300 (note, patient received standard steroid and Benadryl preprocedural prep given history of contrast reaction and tolerated the contrast without complication) ANESTHESIA/SEDATION: Moderate (conscious) sedation was employed during this procedure. A total of Versed 3 mg and Fentanyl 100 mcg was administered intravenously. Moderate Sedation Time: 3 hours, 27 minutes. The patient's level of consciousness and vital signs were monitored continuously by radiology nursing throughout the procedure under my direct supervision. FLUOROSCOPY TIME:  72 minutes, 54 seconds (2,668 mGy) ACCESS: Right common femoral artery; hemostasis achieved with manual compression. COMPLICATIONS: None immediate. TECHNIQUE: Informed written consent was obtained from the patient after a discussion of the risks, benefits and alternatives to treatment. Questions regarding the procedure were encouraged and answered. A  timeout was performed prior to the initiation of the procedure. The right groin was prepped and draped in the usual sterile fashion, and a sterile drape was applied covering the operative field.  Maximum barrier sterile technique with sterile gowns and gloves were used for the procedure. A timeout was performed prior to the initiation of the procedure. Local anesthesia was provided with 1% lidocaine. The right femoral head was marked fluoroscopically. Under direct ultrasound guidance, the right common femoral artery was accessed with a micropuncture kit allowing placement of a of 5-French vascular sheath. An ultrasound image was saved for documentation purposes. A limited arteriogram performed through the side arm of the sheath confirming appropriate access within the right common femoral artery. Over a Bentson wire, the flush catheter was advanced to the level of the perirenal abdominal aorta for the catheter was back bled and flushed. Next, 2 flush abdominal aortogram performed in various obliquities Next, over a Bentson wire, the Omni Flush catheter was exchanged for a Mickelson catheter was utilized select the superior mesenteric artery. Selective superior mesenteric arteriograms were performed in various obliquities. Initial attempts were made to cannulate the right colic artery with the use of a Asahi 0.014 microwire and a Engineer, materials, however this ultimately proved unsuccessful secondary to the patulous nature of the main trunk of the SMA. As such, over a Bentson wire, the Mickelson catheter was exchanged for a C2 catheter which was advanced further into the SMA regional to the origin of the right colic artery. Ultimately, the right colic artery was cannulated with a fathom-14 microwire and Echelon-14 microcatheter. Selective right colic arteriogram was performed. The microcatheter was advanced through the entirety of the right middle colic artery in through the marginal artery of Drummond past  the left colic artery regional to the confluence with the IMA. Contrast injection was performed at this location. Next, with the use of a Transcend 14 microwire, the microcatheter was advanced retrograde into the IMA. Contrast injection confirmed appropriate positioning. Next, the microcatheter was advanced further into the native abdominal aortic aneurysm sac. Contrast injection confirmed appropriate positioning. Prolonged efforts were made to attempt to identify the inflow vessels to the aneurysm sac. Ultimately, the cranial aspect of the aneurysm was coiled with multiple overlapping ev3 framing and filling microcoils. Multiple contrast injection were performed throughout coil deployment. Ultimately, with the cranial aspect of the aneurysm sac embolized, the microcatheter was advanced into the caudal aspect of the aneurysm sac. Contrast injection was performed confirmed appropriate positioning. Again, the caudal aspect of the native abdominal aortic aneurysm sac was embolized with multiple overlapping ev3 framing and filling microcoils. Multiple contrast injections were performed throughout coil deployment. Once the caudal aspect of the aneurysm was successfully coil embolized microcatheter was flushed with a small amount of DMSO and approximately 1 vial of Onyx was administered under direct fluoroscopic guidance. The microcatheter was removed intact followed by removal of the C2 catheter. Postprocedural spot fluoroscopic radiographic images were obtained in various obliquities. Next, hemostasis achieved at the right groin access site with deployment of an Exoseal closure device as well as manual compression. A dressing was placed. The patient tolerated the procedure well without immediate postprocedural complication. FINDINGS: Flush abdominal aortogram was negative for definitive evidence of a type 1a endoleak. Superior mesenteric arteriogram demonstrates predominant arterial supply to the marginal artery of  [place] via the right middle colic artery. Right middle colic artery was selected with contrast injection confirming this finding. Successful traversal of the entirety of the marginal artery of [place] and retrograde cannulation of the IMA. Contrast injection of the IMA demonstrates outflow from the excluded native abdominal aortic aneurysm sac. Contrast injection within the native abdominal aortic aneurysm sac demonstrates  filling of the irregular endoleak as was demonstrated on delayed venous phase imaging of preceding abdominal CT. Ultimately, 4 discrete sources of type 2 endoleak inflow were identified: a tiny accessory right renal artery, a combined origin of the bilateral L4 lumbar arteries as well as the middle sacral artery. Following coil embolization of the cranial aspect of the aneurysm sac, inflow from the accessory right renal artery was no longer identified. Completion images demonstrate excellent coil embolization and Onyx casting of the middle sacral artery as well as the confluence of the bilateral L4 lumbar arteries with Onyx. IMPRESSION: Technically successful percutaneous coil and Onyx embolization of complex type 2 endoleak as detailed above. PLAN: - Patient will be seen in follow-up consultation in 3-4 weeks for postprocedural evaluation and management. - Initial postprocedural imaging will be obtained in approximately 3-6 months. Electronically Signed   By: Sandi Mariscal M.D.   On: 12/03/2017 16:54   Ir Angiogram Selective Each Additional Vessel  Result Date: 12/03/2017 INDICATION: History of abdominal aortic aneurysm with slowly enlarging aneurysm sac secondary to a presumed type 2 endoleak via the IMA. Please refer to formal consultation in the epic EMR dated 11/08/2017 for additional details. EXAM: 1. FLUSH ABDOMINAL AORTOGRAM 2. SUPERIOR MESENTERIC ARTERIOGRAM 3. SELECTIVE MARGINAL ARTERY OF [place] ARTERIOGRAM 4. SELECTIVE INFERIOR MESENTERIC ARTERIOGRAM 5. SELECTED INJECTION OF  NATIVE ABDOMINAL AORTIC ANEURYSM SAC WITH SUBSEQUENT COIL AND ONYX EMBOLIZATION. 6. ULTRASOUND GUIDED VASCULAR ACCESS OF THE RIGHT COMMON FEMORAL ARTERY COMPARISON:  CTA abdomen and pelvis - 11/08/2017 MEDICATIONS: None CONTRAST:  110 cc Isovue 300 (note, patient received standard steroid and Benadryl preprocedural prep given history of contrast reaction and tolerated the contrast without complication) ANESTHESIA/SEDATION: Moderate (conscious) sedation was employed during this procedure. A total of Versed 3 mg and Fentanyl 100 mcg was administered intravenously. Moderate Sedation Time: 3 hours, 27 minutes. The patient's level of consciousness and vital signs were monitored continuously by radiology nursing throughout the procedure under my direct supervision. FLUOROSCOPY TIME:  72 minutes, 54 seconds (2,668 mGy) ACCESS: Right common femoral artery; hemostasis achieved with manual compression. COMPLICATIONS: None immediate. TECHNIQUE: Informed written consent was obtained from the patient after a discussion of the risks, benefits and alternatives to treatment. Questions regarding the procedure were encouraged and answered. A timeout was performed prior to the initiation of the procedure. The right groin was prepped and draped in the usual sterile fashion, and a sterile drape was applied covering the operative field. Maximum barrier sterile technique with sterile gowns and gloves were used for the procedure. A timeout was performed prior to the initiation of the procedure. Local anesthesia was provided with 1% lidocaine. The right femoral head was marked fluoroscopically. Under direct ultrasound guidance, the right common femoral artery was accessed with a micropuncture kit allowing placement of a of 5-French vascular sheath. An ultrasound image was saved for documentation purposes. A limited arteriogram performed through the side arm of the sheath confirming appropriate access within the right common femoral artery.  Over a Bentson wire, the flush catheter was advanced to the level of the perirenal abdominal aorta for the catheter was back bled and flushed. Next, 2 flush abdominal aortogram performed in various obliquities Next, over a Bentson wire, the Omni Flush catheter was exchanged for a Mickelson catheter was utilized select the superior mesenteric artery. Selective superior mesenteric arteriograms were performed in various obliquities. Initial attempts were made to cannulate the right colic artery with the use of a Asahi 0.014 microwire and a Engineer, materials, however  this ultimately proved unsuccessful secondary to the patulous nature of the main trunk of the SMA. As such, over a Bentson wire, the Mickelson catheter was exchanged for a C2 catheter which was advanced further into the SMA regional to the origin of the right colic artery. Ultimately, the right colic artery was cannulated with a fathom-14 microwire and Echelon-14 microcatheter. Selective right colic arteriogram was performed. The microcatheter was advanced through the entirety of the right middle colic artery in through the marginal artery of [place] past the left colic artery regional to the confluence with the IMA. Contrast injection was performed at this location. Next, with the use of a Transcend 14 microwire, the microcatheter was advanced retrograde into the IMA. Contrast injection confirmed appropriate positioning. Next, the microcatheter was advanced further into the native abdominal aortic aneurysm sac. Contrast injection confirmed appropriate positioning. Prolonged efforts were made to attempt to identify the inflow vessels to the aneurysm sac. Ultimately, the cranial aspect of the aneurysm was coiled with multiple overlapping ev3 framing and filling microcoils. Multiple contrast injection were performed throughout coil deployment. Ultimately, with the cranial aspect of the aneurysm sac embolized, the microcatheter was advanced into the  caudal aspect of the aneurysm sac. Contrast injection was performed confirmed appropriate positioning. Again, the caudal aspect of the native abdominal aortic aneurysm sac was embolized with multiple overlapping ev3 framing and filling microcoils. Multiple contrast injections were performed throughout coil deployment. Once the caudal aspect of the aneurysm was successfully coil embolized microcatheter was flushed with a small amount of DMSO and approximately 1 vial of Onyx was administered under direct fluoroscopic guidance. The microcatheter was removed intact followed by removal of the C2 catheter. Postprocedural spot fluoroscopic radiographic images were obtained in various obliquities. Next, hemostasis achieved at the right groin access site with deployment of an Exoseal closure device as well as manual compression. A dressing was placed. The patient tolerated the procedure well without immediate postprocedural complication. FINDINGS: Flush abdominal aortogram was negative for definitive evidence of a type 1a endoleak. Superior mesenteric arteriogram demonstrates predominant arterial supply to the marginal artery of [place] via the right middle colic artery. Right middle colic artery was selected with contrast injection confirming this finding. Successful traversal of the entirety of the marginal artery of [place] and retrograde cannulation of the IMA. Contrast injection of the IMA demonstrates outflow from the excluded native abdominal aortic aneurysm sac. Contrast injection within the native abdominal aortic aneurysm sac demonstrates filling of the irregular endoleak as was demonstrated on delayed venous phase imaging of preceding abdominal CT. Ultimately, 4 discrete sources of type 2 endoleak inflow were identified: a tiny accessory right renal artery, a combined origin of the bilateral L4 lumbar arteries as well as the middle sacral artery. Following coil embolization of the cranial aspect of the aneurysm  sac, inflow from the accessory right renal artery was no longer identified. Completion images demonstrate excellent coil embolization and Onyx casting of the middle sacral artery as well as the confluence of the bilateral L4 lumbar arteries with Onyx. IMPRESSION: Technically successful percutaneous coil and Onyx embolization of complex type 2 endoleak as detailed above. PLAN: - Patient will be seen in follow-up consultation in 3-4 weeks for postprocedural evaluation and management. - Initial postprocedural imaging will be obtained in approximately 3-6 months. Electronically Signed   By: Sandi Mariscal M.D.   On: 12/03/2017 16:54   Ir US Guide Vasc Access Right  Result Date: 12/03/2017 INDICATION: History of abdominal aortic aneurysm with slowly enlarging  aneurysm sac secondary to a presumed type 2 endoleak via the IMA. Please refer to formal consultation in the epic EMR dated 11/08/2017 for additional details. EXAM: 1. FLUSH ABDOMINAL AORTOGRAM 2. SUPERIOR MESENTERIC ARTERIOGRAM 3. SELECTIVE MARGINAL ARTERY OF [place] ARTERIOGRAM 4. SELECTIVE INFERIOR MESENTERIC ARTERIOGRAM 5. SELECTED INJECTION OF NATIVE ABDOMINAL AORTIC ANEURYSM SAC WITH SUBSEQUENT COIL AND ONYX EMBOLIZATION. 6. ULTRASOUND GUIDED VASCULAR ACCESS OF THE RIGHT COMMON FEMORAL ARTERY COMPARISON:  CTA abdomen and pelvis - 11/08/2017 MEDICATIONS: None CONTRAST:  110 cc Isovue 300 (note, patient received standard steroid and Benadryl preprocedural prep given history of contrast reaction and tolerated the contrast without complication) ANESTHESIA/SEDATION: Moderate (conscious) sedation was employed during this procedure. A total of Versed 3 mg and Fentanyl 100 mcg was administered intravenously. Moderate Sedation Time: 3 hours, 27 minutes. The patient's level of consciousness and vital signs were monitored continuously by radiology nursing throughout the procedure under my direct supervision. FLUOROSCOPY TIME:  72 minutes, 54 seconds (2,668 mGy) ACCESS:  Right common femoral artery; hemostasis achieved with manual compression. COMPLICATIONS: None immediate. TECHNIQUE: Informed written consent was obtained from the patient after a discussion of the risks, benefits and alternatives to treatment. Questions regarding the procedure were encouraged and answered. A timeout was performed prior to the initiation of the procedure. The right groin was prepped and draped in the usual sterile fashion, and a sterile drape was applied covering the operative field. Maximum barrier sterile technique with sterile gowns and gloves were used for the procedure. A timeout was performed prior to the initiation of the procedure. Local anesthesia was provided with 1% lidocaine. The right femoral head was marked fluoroscopically. Under direct ultrasound guidance, the right common femoral artery was accessed with a micropuncture kit allowing placement of a of 5-French vascular sheath. An ultrasound image was saved for documentation purposes. A limited arteriogram performed through the side arm of the sheath confirming appropriate access within the right common femoral artery. Over a Bentson wire, the flush catheter was advanced to the level of the perirenal abdominal aorta for the catheter was back bled and flushed. Next, 2 flush abdominal aortogram performed in various obliquities Next, over a Bentson wire, the Omni Flush catheter was exchanged for a Mickelson catheter was utilized select the superior mesenteric artery. Selective superior mesenteric arteriograms were performed in various obliquities. Initial attempts were made to cannulate the right colic artery with the use of a Asahi 0.014 microwire and a Engineer, materials, however this ultimately proved unsuccessful secondary to the patulous nature of the main trunk of the SMA. As such, over a Bentson wire, the Mickelson catheter was exchanged for a C2 catheter which was advanced further into the SMA regional to the origin of the  right colic artery. Ultimately, the right colic artery was cannulated with a fathom-14 microwire and Echelon-14 microcatheter. Selective right colic arteriogram was performed. The microcatheter was advanced through the entirety of the right middle colic artery in through the marginal artery of [place] past the left colic artery regional to the confluence with the IMA. Contrast injection was performed at this location. Next, with the use of a Transcend 14 microwire, the microcatheter was advanced retrograde into the IMA. Contrast injection confirmed appropriate positioning. Next, the microcatheter was advanced further into the native abdominal aortic aneurysm sac. Contrast injection confirmed appropriate positioning. Prolonged efforts were made to attempt to identify the inflow vessels to the aneurysm sac. Ultimately, the cranial aspect of the aneurysm was coiled with multiple overlapping ev3 framing and filling microcoils.  Multiple contrast injection were performed throughout coil deployment. Ultimately, with the cranial aspect of the aneurysm sac embolized, the microcatheter was advanced into the caudal aspect of the aneurysm sac. Contrast injection was performed confirmed appropriate positioning. Again, the caudal aspect of the native abdominal aortic aneurysm sac was embolized with multiple overlapping ev3 framing and filling microcoils. Multiple contrast injections were performed throughout coil deployment. Once the caudal aspect of the aneurysm was successfully coil embolized microcatheter was flushed with a small amount of DMSO and approximately 1 vial of Onyx was administered under direct fluoroscopic guidance. The microcatheter was removed intact followed by removal of the C2 catheter. Postprocedural spot fluoroscopic radiographic images were obtained in various obliquities. Next, hemostasis achieved at the right groin access site with deployment of an Exoseal closure device as well as manual compression. A  dressing was placed. The patient tolerated the procedure well without immediate postprocedural complication. FINDINGS: Flush abdominal aortogram was negative for definitive evidence of a type 1a endoleak. Superior mesenteric arteriogram demonstrates predominant arterial supply to the marginal artery of [place] via the right middle colic artery. Right middle colic artery was selected with contrast injection confirming this finding. Successful traversal of the entirety of the marginal artery of [place] and retrograde cannulation of the IMA. Contrast injection of the IMA demonstrates outflow from the excluded native abdominal aortic aneurysm sac. Contrast injection within the native abdominal aortic aneurysm sac demonstrates filling of the irregular endoleak as was demonstrated on delayed venous phase imaging of preceding abdominal CT. Ultimately, 4 discrete sources of type 2 endoleak inflow were identified: a tiny accessory right renal artery, a combined origin of the bilateral L4 lumbar arteries as well as the middle sacral artery. Following coil embolization of the cranial aspect of the aneurysm sac, inflow from the accessory right renal artery was no longer identified. Completion images demonstrate excellent coil embolization and Onyx casting of the middle sacral artery as well as the confluence of the bilateral L4 lumbar arteries with Onyx. IMPRESSION: Technically successful percutaneous coil and Onyx embolization of complex type 2 endoleak as detailed above. PLAN: - Patient will be seen in follow-up consultation in 3-4 weeks for postprocedural evaluation and management. - Initial postprocedural imaging will be obtained in approximately 3-6 months. Electronically Signed   By: Sandi Mariscal M.D.   On: 12/03/2017 16:54   Ir Embo Arterial Not [place] Guide Roadmapping  Result Date: 12/03/2017 INDICATION: History of abdominal aortic aneurysm with slowly enlarging aneurysm sac secondary to a presumed type  2 endoleak via the IMA. Please refer to formal consultation in the epic EMR dated 11/08/2017 for additional details. EXAM: 1. FLUSH ABDOMINAL AORTOGRAM 2. SUPERIOR MESENTERIC ARTERIOGRAM 3. SELECTIVE MARGINAL ARTERY OF [place] ARTERIOGRAM 4. SELECTIVE INFERIOR MESENTERIC ARTERIOGRAM 5. SELECTED INJECTION OF NATIVE ABDOMINAL AORTIC ANEURYSM SAC WITH SUBSEQUENT COIL AND ONYX EMBOLIZATION. 6. ULTRASOUND GUIDED VASCULAR ACCESS OF THE RIGHT COMMON FEMORAL ARTERY COMPARISON:  CTA abdomen and pelvis - 11/08/2017 MEDICATIONS: None CONTRAST:  110 cc Isovue 300 (note, patient received standard steroid and Benadryl preprocedural prep given history of contrast reaction and tolerated the contrast without complication) ANESTHESIA/SEDATION: Moderate (conscious) sedation was employed during this procedure. A total of Versed 3 mg and Fentanyl 100 mcg was administered intravenously. Moderate Sedation Time: 3 hours, 27 minutes. The patient's level of consciousness and vital signs were monitored continuously by radiology nursing throughout the procedure under my direct supervision. FLUOROSCOPY TIME:  72 minutes, 54 seconds (2,668 mGy) ACCESS: Right common femoral artery; hemostasis achieved  with manual compression. COMPLICATIONS: None immediate. TECHNIQUE: Informed written consent was obtained from the patient after a discussion of the risks, benefits and alternatives to treatment. Questions regarding the procedure were encouraged and answered. A timeout was performed prior to the initiation of the procedure. The right groin was prepped and draped in the usual sterile fashion, and a sterile drape was applied covering the operative field. Maximum barrier sterile technique with sterile gowns and gloves were used for the procedure. A timeout was performed prior to the initiation of the procedure. Local anesthesia was provided with 1% lidocaine. The right femoral head was marked fluoroscopically. Under direct ultrasound guidance, the  right common femoral artery was accessed with a micropuncture kit allowing placement of a of 5-French vascular sheath. An ultrasound image was saved for documentation purposes. A limited arteriogram performed through the side arm of the sheath confirming appropriate access within the right common femoral artery. Over a Bentson wire, the flush catheter was advanced to the level of the perirenal abdominal aorta for the catheter was back bled and flushed. Next, 2 flush abdominal aortogram performed in various obliquities Next, over a Bentson wire, the Omni Flush catheter was exchanged for a Mickelson catheter was utilized select the superior mesenteric artery. Selective superior mesenteric arteriograms were performed in various obliquities. Initial attempts were made to cannulate the right colic artery with the use of a Asahi 0.014 microwire and a Engineer, materials, however this ultimately proved unsuccessful secondary to the patulous nature of the main trunk of the SMA. As such, over a Bentson wire, the Mickelson catheter was exchanged for a C2 catheter which was advanced further into the SMA regional to the origin of the right colic artery. Ultimately, the right colic artery was cannulated with a fathom-14 microwire and Echelon-14 microcatheter. Selective right colic arteriogram was performed. The microcatheter was advanced through the entirety of the right middle colic artery in through the marginal artery of [place] past the left colic artery regional to the confluence with the IMA. Contrast injection was performed at this location. Next, with the use of a Transcend 14 microwire, the microcatheter was advanced retrograde into the IMA. Contrast injection confirmed appropriate positioning. Next, the microcatheter was advanced further into the native abdominal aortic aneurysm sac. Contrast injection confirmed appropriate positioning. Prolonged efforts were made to attempt to identify the inflow vessels to the  aneurysm sac. Ultimately, the cranial aspect of the aneurysm was coiled with multiple overlapping ev3 framing and filling microcoils. Multiple contrast injection were performed throughout coil deployment. Ultimately, with the cranial aspect of the aneurysm sac embolized, the microcatheter was advanced into the caudal aspect of the aneurysm sac. Contrast injection was performed confirmed appropriate positioning. Again, the caudal aspect of the native abdominal aortic aneurysm sac was embolized with multiple overlapping ev3 framing and filling microcoils. Multiple contrast injections were performed throughout coil deployment. Once the caudal aspect of the aneurysm was successfully coil embolized microcatheter was flushed with a small amount of DMSO and approximately 1 vial of Onyx was administered under direct fluoroscopic guidance. The microcatheter was removed intact followed by removal of the C2 catheter. Postprocedural spot fluoroscopic radiographic images were obtained in various obliquities. Next, hemostasis achieved at the right groin access site with deployment of an Exoseal closure device as well as manual compression. A dressing was placed. The patient tolerated the procedure well without immediate postprocedural complication. FINDINGS: Flush abdominal aortogram was negative for definitive evidence of a type 1a endoleak. Superior mesenteric arteriogram demonstrates predominant arterial  supply to the marginal artery of [place] via the right middle colic artery. Right middle colic artery was selected with contrast injection confirming this finding. Successful traversal of the entirety of the marginal artery of [place] and retrograde cannulation of the IMA. Contrast injection of the IMA demonstrates outflow from the excluded native abdominal aortic aneurysm sac. Contrast injection within the native abdominal aortic aneurysm sac demonstrates filling of the irregular endoleak as was demonstrated on delayed  venous phase imaging of preceding abdominal CT. Ultimately, 4 discrete sources of type 2 endoleak inflow were identified: a tiny accessory right renal artery, a combined origin of the bilateral L4 lumbar arteries as well as the middle sacral artery. Following coil embolization of the cranial aspect of the aneurysm sac, inflow from the accessory right renal artery was no longer identified. Completion images demonstrate excellent coil embolization and Onyx casting of the middle sacral artery as well as the confluence of the bilateral L4 lumbar arteries with Onyx. IMPRESSION: Technically successful percutaneous coil and Onyx embolization of complex type 2 endoleak as detailed above. PLAN: - Patient will be seen in follow-up consultation in 3-4 weeks for postprocedural evaluation and management. - Initial postprocedural imaging will be obtained in approximately 3-6 months. Electronically Signed   By: Sandi Mariscal M.D.   On: 12/03/2017 16:54    Labs:  CBC: Recent Labs    06/18/17 1006 12/03/17 0745 12/14/17 1034  WBC 7.1 7.5 9.8  HGB 15.7 16.3 14.9  HCT 46.3 46.6 44.3  PLT 243 279 279.0    COAGS: Recent Labs    12/03/17 0745  INR 0.90    BMP: Recent Labs    06/18/17 1006 11/08/17 0834 12/03/17 0745 12/14/17 1034  NA 141  --  139 143  K 5.3*  --  4.3 5.1  CL  --   --  107 106  CO2 25  --  20* 29  GLUCOSE 146*  --  165* 92  BUN 14.3  --  18 12  CALCIUM 10.3  --  9.8 9.7  CREATININE 0.9 1.00 1.03 0.93  GFRNONAA  --   --  >60  --   GFRAA  --   --  >60  --     LIVER FUNCTION TESTS: Recent Labs    06/18/17 1006 12/14/17 1034  BILITOT 0.35 0.4  AST 15 11  ALT 17 11  ALKPHOS 63 61  PROT 7.6 6.9  ALBUMIN 4.0 4.2    TUMOR MARKERS: No results for input(s): AFPTM, CEA, CA199, CHROMGRNA in the last 8760 hours.  Assessment and Plan:  Jordan Mccullough is a 77 y.o. male with complex past medical history significant for smoking, hypertension, hyperlipidemia, CAD and lung cancer  (with brain metastasis though remains clinically stable without evidence of disease progression) who initially underwent a technically successful repair of infrarenal abdominal aortic aneurysm by Dr. Trula Slade on 06/08/2014.  Unfortunately, patient's native abdominal aortic aneurysm continued to grow on surveillance imaging for which patient underwent an unsuccessful mesenteric arteriogram and type II endoleak repair on 12/03/2017.  The patient has recovered completely from the procedure and is without complaint.  The post EVAR stent CTA of the abdomen and pelvis performed 11/08/2017 as well as intraprocedural images during mesenteric arteriogram and endoleak repair performed 12/03/2017 were reviewed in detail with the patient.  Per the patient, he is scheduled to undergo a CT of the chest abdomen and pelvis for his lung cancer surveillance sometime in the coming months.    Once  performed, I will review the surveillance examination to determine its diagnostic quality in evaluating his aneurysm.  If the surveillance examination suffices, dedicated post stent CT scan will not be obtained.  Otherwise, I will proceed with obtaining a dedicated post stent CTA 6 months following the intervention (October 2019).  Patient was seen in follow consultation in October 2019 though was instructed to call the interventional radiology clinic with any interval questions or concerns.  A copy of this report was sent to the requesting provider on this date.  Electronically Signed: Sandi Mariscal 01/01/2018, 12:27 PM   I spent a total of 15 Minutes in face to face in clinical consultation, greater than 50% of which was counseling/coordinating care for post AAA endoleak repair

## 2018-01-16 ENCOUNTER — Other Ambulatory Visit: Payer: Self-pay | Admitting: Family Medicine

## 2018-01-19 DIAGNOSIS — R972 Elevated prostate specific antigen [PSA]: Secondary | ICD-10-CM

## 2018-01-19 HISTORY — DX: Elevated prostate specific antigen (PSA): R97.20

## 2018-01-22 DIAGNOSIS — R3911 Hesitancy of micturition: Secondary | ICD-10-CM | POA: Diagnosis not present

## 2018-01-22 DIAGNOSIS — N401 Enlarged prostate with lower urinary tract symptoms: Secondary | ICD-10-CM | POA: Diagnosis not present

## 2018-01-22 DIAGNOSIS — R972 Elevated prostate specific antigen [PSA]: Secondary | ICD-10-CM | POA: Diagnosis not present

## 2018-01-23 ENCOUNTER — Encounter: Payer: Self-pay | Admitting: Family Medicine

## 2018-01-23 ENCOUNTER — Other Ambulatory Visit: Payer: Self-pay | Admitting: Urology

## 2018-01-23 DIAGNOSIS — R972 Elevated prostate specific antigen [PSA]: Secondary | ICD-10-CM

## 2018-01-23 LAB — PSA: PSA: 26.5

## 2018-01-24 ENCOUNTER — Other Ambulatory Visit: Payer: Self-pay | Admitting: Radiation Therapy

## 2018-01-24 DIAGNOSIS — C7949 Secondary malignant neoplasm of other parts of nervous system: Secondary | ICD-10-CM

## 2018-01-24 DIAGNOSIS — C7931 Secondary malignant neoplasm of brain: Secondary | ICD-10-CM

## 2018-01-28 ENCOUNTER — Other Ambulatory Visit: Payer: Self-pay

## 2018-01-28 ENCOUNTER — Encounter: Payer: Self-pay | Admitting: Neurology

## 2018-01-28 ENCOUNTER — Ambulatory Visit: Payer: Medicare Other | Admitting: Neurology

## 2018-01-28 VITALS — BP 128/76 | HR 78 | Ht 63.0 in | Wt 145.0 lb

## 2018-01-28 DIAGNOSIS — C7931 Secondary malignant neoplasm of brain: Secondary | ICD-10-CM

## 2018-01-28 DIAGNOSIS — G40209 Localization-related (focal) (partial) symptomatic epilepsy and epileptic syndromes with complex partial seizures, not intractable, without status epilepticus: Secondary | ICD-10-CM

## 2018-01-28 NOTE — Progress Notes (Signed)
NEUROLOGY FOLLOW UP OFFICE NOTE  Jordan Mccullough [id number]  DOB: 07-18-1941  HISTORY OF PRESENT ILLNESS: I had the pleasure of seeing Jordan Mccullough in follow-up in the neurology clinic on 01/28/2018. The patient was last seen 6 months ago for seizures. He had a convulsive seizure last 08/04/15 and was found to have brain metastases to the right frontal lobe. He was started on Keppra and did well seizure-free for almost 7 months until he had focal motor seizures affecting the left side of his face at the beginning of July 2017. Keppra caused drowsiness, he has been taking Lamotrigine with no further side effects. He had two focal facial seizures during Keppra taper and Lamotrigine dose was increased to 200mg  BID. He was completely off the [facility] and only on Lamotrigine then reported two facial seizures on 12/21/16. He had side effects on 600mg  dose of Lamotrigine, and did good on 300mg  in AM, 200mg  in PM. He requested for dose reduction on last visit since he was doing well. He has been taking Lamotrigine 200mg  BID with no further focal seizures since May 2018. No convulsions since 2016. He feels the coordination on his left hand is better, and he is not dragging his left foot as much. His wife reminds him to pick up his foot. He has hearing loss and does not wear his hearing aids. He denies any headaches, dizziness, focal numbness/tingling/weakness, no falls. He had a repeat MRI brain with and without contrast last 12/20/17 which I personally reviewed, stable, with right parietal hemorrhagic lesion unchanged 19x7mm, high right parietal enhancing lesion 6x70mm unchanged.   HPI 10/11/15: This is a pleasant 77 yo RH man with a history of lung cancer s/p lobectomy, chemotherapy and radiation, hyperlipidemia, CAD s/p MI, peripheral vascular disease, abdominal aortic aneurysm, admitted to [facility] last 08/04/15 after a new onset seizure and found to have brain metastases. He states he got his flu shot then went to work,  got home and started having a beer the threw up, his eyes got blurry to the point he could not see anything, then has no recollection of events. According to ER notes, after the nausea and vomiting, he decided to drink beer to feel better, his wife left the room then came back to find him with arms extended, shaking, eyes rolled back lasting 5-10 seconds. He recalls coming to without feeling confused, no focal weakness. He was brought to [facility] ER where CT head showed a 1.6 x 1.5 cm hyperdense mass in the right frontal lobe worrisome for focal metastases. MRI was done showing 2 enhancing masses in the right frontal lobe measuring up to 2 x 1.7 cm, no mass effect. He was started on Keppra. He underwent stereotactic radiation.   He called our office in July 2017 to report facial seizures. He had 5 in one day lasting 5-6 seconds, then stopped. His wife called our office to report 4 facial seizures on 7/19 and one on 7/20, these were lasting 5-6 seconds, he could feel it coming from the left jawline going up to his forehead, with facial twitching seen. No associated headache or confusion. He has a constant feeling of numbness on the left side of his face, "like you had Novocaine." He continues to have left arm and leg weakness that has been chronic, no twitching involved. He feels he has a hard time controlling them. He denies any falls. Keppra dose was increased to 1000mg  qhs.  He denies any prior history of seizures. His wife  denies any staring/unresponsive episodes, no gaps in time, olfactory/gustatory hallucinations, deja vu, rising epigastric sensation, focal numbness/tingling/weakness, myoclonic jerks, headaches, dysarthria/dysphagia, bowel/bladder dysfunction. He was discharged home on Decadron and Keppra, and reports that he was "just totally out of it," functioning only 50%. He felt significantly better getting of Decadron, but continued to have generalized weakness, particularly in the legs, blurred vision,  and reported this to his PCP Dr. Anitra Lauth. Keppra dose was reduced to 250mg  BID 5 days ago, and he reports that symptoms are better, but still there. He states that he wakes up feeling fine, but then starts feeling the symptoms after taking morning Keppra dose. He has mild 2-3 over 10 back pain when walking, relieved when he sits down. He continues to drink beer daily but has reduced to 1-2 a day from 3-5 beers a day. His wife reports he would drink 3 beer cases in a week.  Epilepsy Risk Factors: Brain metastases in the right frontal lobe. Otherwise he had a normal birth and early development. There is no history of febrile convulsions, CNS infections such as meningitis/encephalitis, significant traumatic brain injury, neurosurgical procedures, or family history of seizures.   I personally reviewed MRI brain with and without contrast done 08/12/15 which showed an 8x16mm centrally necrotic metastasis in the right posterior frontal lobe; there is a centrally necrotic mass measuring 7mm at the right vertex frontoparietal junction with regional vasogenic edema. Routine EEG 08/05/15 was a normal wake and sleep study  PAST MEDICAL HISTORY: Past Medical History:  Diagnosis Date  . AAA (abdominal aortic aneurysm) (HCC)    s/p repair (aortoiliac bipass; with persistent endograft leak in inferior mesenteric artery)--stable as of 01/2017 vascular f/u.  Jordan Mccullough Asymptomatic cholelithiasis 07/2015   Incidental finding on PET CT  . Back pain 04/19/2016  . BPH with elevated PSA    PSA signif rise 11/2017 (5.4 in 2015 to 31.21 November 2017.  Urol (Dr. Lovena Neighbours) to do MRI prostate, then potentially prostate bx.  . Coronary artery disease    MI 1992, S/P  PTCA; negative stress test in November 2011 with no ischemia.   . Diverticulosis   . Hearing loss    Bilateral   . History of radiation therapy 11/10/13-12/12/13   lung,50Gy/98fx  . Hyperlipidemia    Crestor= myalgias  . Hypertension   . Lung cancer ([place]) 05/2013    non-small cell;  L upper lobectomy with mediastinal LN dissection, chemo, and radiation in 2014/2015. Remission until pt had questionable seizure 07/2015--MRI showed brain mets; palliative brain rad (stereotactic radiation therapy) started 08/25/15.  CT C/A/P clear 02/2016, 05/2016, and 11/2016.  Plan per onc is to repeat in 6 mo.  . Malignant neoplasm of upper lobe, left bronchus or lung ([place]) 10/29/2013   ?New spiculated 4.5cm mass in L upper lung field on CXR at [facility] in [place] 07/14/17.  Dr. Julien Nordmann (onc) recommended f/u CXR after abx course.  If mass unchanged then repeat CT chest.  . Malignant neoplasm of upper lobe, left bronchus or lung ([place]) 06/26/2013  . Myocardial infarction [facility]) 1992   Dr Angelena Form    . Peripheral vascular disease ([place]) 08/2012   4.8x4.6 cm infrarenal abdominal aortic fusiform aneurysm, 1.5 cm right common iliac artery aneurysm.  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  . S/P radiation therapy [facility] 08/25/15   Stereotactic radiation therapy: frontoparietal 18gy,posterior frontal lobe 20gy,  . Seizure  disorder ([place]) 2016/2017   as sequela of brain mets;  Grand mal seizure 07/2015, then got on keppra and was seizure-free until focal motor seizures of L side of face began 02/2016- these responded well to up-titration of keppra but pt had adverse side effects so eventually pt had to be switched over to lamictal 07/2016.  Jordan Mccullough Shortness of breath   . Tobacco dependence    "Quit" 1992, but pt has smoked "on and off" since that time    MEDICATIONS:  Outpatient Encounter Medications as of 01/28/2018  Medication Sig  . amLODipine (NORVASC) 5 MG tablet TAKE 1 TABLET BY MOUTH EVERY DAY  . aspirin EC 81 MG tablet Take 81 mg by mouth at bedtime.   Jordan Mccullough atorvastatin (LIPITOR) 20 MG tablet TAKE 1 TABLET (20 MG TOTAL) BY MOUTH DAILY. NEEDS APPOINTMENT FOR FURTHER REFILLS.  Jordan Mccullough  budesonide-formoterol (SYMBICORT) 160-4.5 MCG/ACT inhaler INHALE 1-2 PUFFS INTO THE LINGS EVERY 12 HOURS. GARGLE AND SPIT AFTER USE.  . diphenhydrAMINE (BENADRYL) 50 MG tablet Take 1 tablet (50 mg total) by mouth as directed. Take 1 tablet (50 mg) by mouth 1 hr prior to CTA (scheduled for 11/08/2017) (Patient not taking: Reported on 12/25/2017)  . lamoTRIgine (LAMICTAL) 200 MG tablet TAKE 1 TABLET BY MOUTH IN THE MORNING. TAKE 1.5 TABLETS BY MOUTH IN THE EVENING (Patient taking differently: Take 200 mg by mouth twice daily)  . metoprolol tartrate (LOPRESSOR) 50 MG tablet TAKE 1 TABLET BY MOUTH TWICE A DAY  . Multiple Vitamin (MULTIVITAMIN) tablet Take 1 tablet by mouth daily.   . pentoxifylline (TRENTAL) 400 MG CR tablet Take 1 tablet (400 mg total) by mouth 3 (three) times daily.  . predniSONE (DELTASONE) 50 MG tablet Take 1 tablet (50 mg total) by mouth as directed. Take Prednisone 50 mg by mouth 13 hrs, 7 hrs and 1 hr prior to CTA (scheduled 11/08/2017).  . vitamin E 1000 UNIT capsule Take 1 capsule (1,000 Units total) by mouth daily.   No facility-administered encounter medications on file as of 01/28/2018.     ALLERGIES: Allergies  Allergen Reactions  . Fluvirin [Influenza Vac Split Quad] Nausea And Vomiting and Other (See Comments)    Seizure like activity?  Jordan Mccullough Rosuvastatin Other (See Comments)    Myalgias and dark urine  . Iodinated Diagnostic Agents Hives    1 hive on lt cheek lasting approximately 1 hour on last 2 CT per pt; needs pre meds in future; 50 mg benadryl po 1 hr prior to exam per Dr. Weber Cooks 07/2014  11/2016 per Tery Sanfilippo, pt needs 13hr premeds per our policy.    FAMILY HISTORY: Family History  Problem Relation Age of Onset  . Hypertension Mother   . Prostate cancer Father        Prostate cancer  . Cancer Father        Brain Tumor  . Lung cancer Sister        NON SMOKER  . Cancer Sister   . Hyperlipidemia Sister   . Hyperlipidemia Brother   . Heart attack Brother     . Hypertension Brother   . Prostate cancer Brother   . Diabetes Neg Hx   . Stroke Neg Hx     SOCIAL HISTORY: Social History   Socioeconomic History  . Marital status: Married    Spouse name: Not on file  . Number of children: Not on file  . Years of education: Not on file  . Highest education level: Not on file  Occupational  History  . Occupation: Retired Scientist, clinical (histocompatibility and immunogenetics)  . Financial resource strain: Not on file  . Food insecurity:    Worry: Not on file    Inability: Not on file  . Transportation needs:    Medical: Not on file    Non-medical: Not on file  Tobacco Use  . Smoking status: Current Every Day Smoker    Packs/day: 1.00    Years: 24.00    Pack years: 24.00    Types: Cigarettes    Last attempt to quit: 05/28/2013    Years since quitting: 4.6  . Smokeless tobacco: Never Used  . Tobacco comment: QUIT 15 YEARS AGO-07/03/17 smoking  Substance and Sexual Activity  . Alcohol use: Yes    Alcohol/week: 9.0 oz    Types: 3 Glasses of wine, 12 Cans of beer per week    Comment: 1-2 beers a day  . Drug use: No  . Sexual activity: Not on file  Lifestyle  . Physical activity:    Days per week: Not on file    Minutes per session: Not on file  . Stress: Not on file  Relationships  . Social connections:    Talks on phone: Not on file    Gets together: Not on file    Attends religious service: Not on file    Active member of club or organization: Not on file    Attends meetings of clubs or organizations: Not on file    Relationship status: Not on file  . Intimate partner violence:    Fear of current or ex partner: Not on file    Emotionally abused: Not on file    Physically abused: Not on file    Forced sexual activity: Not on file  Other Topics Concern  . Not on file  Social History Narrative   HEART HEALTHY DIET.     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   MARRIED, 2 children who live in [place].  Smoked on and off since then.    ALCOHOL USE -YES- RED WINE SOCIALLY, couple of beers at night usually.    REVIEW OF SYSTEMS: Constitutional: No fevers, chills, or sweats, generalized fatigue, change in appetite Eyes: No visual changes, double vision, eye pain Ear, nose and throat: No hearing loss, ear pain, nasal congestion, sore throat Cardiovascular: No chest pain, palpitations Respiratory:  No shortness of breath at rest or with exertion, wheezes GastrointestinaI: No nausea, vomiting, diarrhea, abdominal pain, fecal incontinence Genitourinary:  No dysuria, urinary retention or frequency Musculoskeletal:  No neck pain, +back pain Integumentary: No rash, pruritus, skin lesions Neurological: as above Psychiatric: No depression,+ insomnia, no anxiety Endocrine: No palpitations, fatigue, diaphoresis, mood swings, change in appetite, change in weight, increased thirst Hematologic/Lymphatic:  No anemia, purpura, petechiae. Allergic/Immunologic: no itchy/runny eyes, nasal congestion, recent allergic reactions, rashes  PHYSICAL EXAM: Vitals:   01/28/18 1507  BP: 128/76  Pulse: 78  SpO2: 95%   General: No acute distress Head:  Normocephalic/atraumatic Neck: supple, no paraspinal tenderness, full range of motion Heart:  Regular rate and rhythm Lungs:  Clear to auscultation bilaterally Back: No paraspinal tenderness Skin/Extremities: No rash, no edema. No pain to palpation of right shoulder Neurological Exam: alert and oriented to person, place, and time. No aphasia or dysarthria. Fund of knowledge is appropriate.  Recent and remote memory are intact.  Attention and concentration are normal.    Able to name objects and repeat phrases. Cranial nerves: Pupils equal, round, reactive  to light. Extraocular movements intact with no nystagmus. Visual fields full. Facial sensation intact. No facial asymmetry. Tongue, uvula, palate midline.  Motor: Bulk and tone normal, muscle strength 5/5 throughout with good finger taps, no  pronator drift. Good finger taps. Sensation to light touch and pin intact.  No extinction to double simultaneous stimulation.  Deep tendon reflexes brisk +2 throughout, toes downgoing.  Finger to nose testing intact.  Gait narrow-based and steady, difficulty with tandem walk, no ataxia.  IMPRESSION: This is a pleasant 77 yo RH man with a history of lung cancer s/p lobectomy, radiation, chemotherapy, found to have 2 right frontal brain metastases after he had a convulsive seizure on 08/04/15. Routine EEG normal. No further convulsions since December 2016. He started having focal motor seizures affecting the left side of his face in July 2017, none since May 2018. Continue Lamotrigine 200mg  BID, he is tolerating medication without side effects. He is aware of [place] driving laws to stop driving after a seizure, until 6 months seizure-free. He will follow-up in 6 months and knows to call for any problems.  Thank you for allowing me to participate in his care.  Please do not hesitate to call for any questions or concerns.  The duration of this appointment visit was 15 minutes of face-to-face time with the patient.  Greater than 50% of this time was spent in counseling, explanation of diagnosis, planning of further management, and coordination of care.   Ellouise Newer, M.D.   CC: Dr. Anitra Lauth, Dr. Sherwood Gambler, Dr. Julien Nordmann

## 2018-01-28 NOTE — Patient Instructions (Signed)
Looking great! Continue Lamictal 200mg  twice a day. Follow-up in 6 months, call for any changes.  Seizure Precautions: 1. If medication has been prescribed for you to prevent seizures, take it exactly as directed.  Do not stop taking the medicine without talking to your doctor first, even if you have not had a seizure in a long time.   2. Avoid activities in which a seizure would cause danger to yourself or to others.  Don't operate dangerous machinery, swim alone, or climb in high or dangerous places, such as on ladders, roofs, or girders.  Do not drive unless your doctor says you may.  3. If you have any warning that you may have a seizure, lay down in a safe place where you can't hurt yourself.    4.  No driving for 6 months from last seizure, as per [facility].   Please refer to the following link on the [facility] website for more information: http://[email or web address]/[email or web address]/[email or web address]   5.  Maintain good sleep hygiene. Avoid alcohol.  6.  Contact your doctor if you have any problems that may be related to the medicine you are taking.  7.  Call 911 and bring the patient back to the ED if:        A.  The seizure lasts longer than 5 minutes.       B.  The patient doesn't awaken shortly after the seizure  C.  The patient has new problems such as difficulty seeing, speaking or moving  D.  The patient was injured during the seizure  E.  The patient has a temperature over 102 F (39C)  F.  The patient vomited and now is having trouble breathing

## 2018-02-07 ENCOUNTER — Encounter: Payer: Self-pay | Admitting: Podiatry

## 2018-02-07 ENCOUNTER — Ambulatory Visit: Payer: Medicare Other | Admitting: Podiatry

## 2018-02-07 DIAGNOSIS — M79672 Pain in left foot: Secondary | ICD-10-CM | POA: Diagnosis not present

## 2018-02-07 DIAGNOSIS — L600 Ingrowing nail: Secondary | ICD-10-CM

## 2018-02-07 DIAGNOSIS — B351 Tinea unguium: Secondary | ICD-10-CM | POA: Diagnosis not present

## 2018-02-07 DIAGNOSIS — M79671 Pain in right foot: Secondary | ICD-10-CM

## 2018-02-07 NOTE — Patient Instructions (Signed)
Seen for hypertrophic nails. All nails debrided. Return in 3 months or as needed.  

## 2018-02-07 NOTE — Progress Notes (Signed)
Subjective: 77 y.o. year old male patient presents complaining of painful nails. Patient requests toe nails trimmed.   History of left lung surgery and radiation treatment. Left side weakness followed.  Objective: Dermatologic: Thick yellow deformed nails x 10. Ingrown nail 2nd left lateral border. Vascular: Pedal pulses are all palpable. Orthopedic: No gross deformities. Neurologic: All epicritic and tactile sensations grossly intact.  Assessment: Dystrophic mycotic nails x 10. Ingrown 2nd toe nail left. Painful hallucal nails bilateral.  Treatment: All mycotic nails and ingrown nails debrided.  2nd toe nail left cleansed with Iodine and compression dressing applied. Return in 3 months or as needed.

## 2018-03-04 ENCOUNTER — Ambulatory Visit
Admission: RE | Admit: 2018-03-04 | Discharge: 2018-03-04 | Disposition: A | Discharge status: Home / Self Care | Payer: Medicare Other | Source: Ambulatory Visit | Attending: Urology | Admitting: Urology

## 2018-03-04 DIAGNOSIS — R972 Elevated prostate specific antigen [PSA]: Secondary | ICD-10-CM

## 2018-03-04 MED ORDER — GADOBENATE DIMEGLUMINE 529 MG/ML IV SOLN
13.0000 mL | Freq: Once | INTRAVENOUS | Status: AC | PRN
  Administered 2018-03-04: 13 mL via INTRAVENOUS

## 2018-03-07 DIAGNOSIS — R972 Elevated prostate specific antigen [PSA]: Secondary | ICD-10-CM | POA: Diagnosis not present

## 2018-03-07 LAB — PSA
PSA: 24.1
PSA: 24.1

## 2018-03-09 ENCOUNTER — Other Ambulatory Visit: Payer: Self-pay | Admitting: Family Medicine

## 2018-03-16 ENCOUNTER — Other Ambulatory Visit: Payer: Self-pay | Admitting: Family Medicine

## 2018-03-21 HISTORY — PX: PROSTATE BIOPSY: SHX241

## 2018-03-24 ENCOUNTER — Encounter: Payer: Self-pay | Admitting: Family Medicine

## 2018-03-27 ENCOUNTER — Encounter: Payer: Self-pay | Admitting: Family Medicine

## 2018-04-12 DIAGNOSIS — R972 Elevated prostate specific antigen [PSA]: Secondary | ICD-10-CM | POA: Diagnosis not present

## 2018-04-16 ENCOUNTER — Other Ambulatory Visit: Payer: Self-pay | Admitting: Radiation Oncology

## 2018-04-16 MED ORDER — PENTOXIFYLLINE ER 400 MG PO TBCR
400.0000 mg | EXTENDED_RELEASE_TABLET | Freq: Three times a day (TID) | ORAL | 5 refills | Status: DC
Start: 2018-04-16 — End: 2019-07-30

## 2018-04-29 ENCOUNTER — Encounter: Payer: Self-pay | Admitting: Family Medicine

## 2018-05-14 ENCOUNTER — Ambulatory Visit: Payer: Medicare Other | Admitting: Podiatry

## 2018-05-14 DIAGNOSIS — M79671 Pain in right foot: Secondary | ICD-10-CM | POA: Diagnosis not present

## 2018-05-14 DIAGNOSIS — B351 Tinea unguium: Secondary | ICD-10-CM | POA: Diagnosis not present

## 2018-05-14 DIAGNOSIS — L600 Ingrowing nail: Secondary | ICD-10-CM | POA: Diagnosis not present

## 2018-05-14 DIAGNOSIS — M79672 Pain in left foot: Secondary | ICD-10-CM | POA: Diagnosis not present

## 2018-05-14 NOTE — Patient Instructions (Signed)
Seen for hypertrophic nails. All nails debrided. Return in 3 months or as needed.  

## 2018-05-15 ENCOUNTER — Other Ambulatory Visit (HOSPITAL_COMMUNITY): Payer: Self-pay | Admitting: Interventional Radiology

## 2018-05-15 ENCOUNTER — Encounter: Payer: Self-pay | Admitting: Podiatry

## 2018-05-15 DIAGNOSIS — T82330D Leakage of aortic (bifurcation) graft (replacement), subsequent encounter: Secondary | ICD-10-CM

## 2018-05-15 DIAGNOSIS — IMO0001 Reserved for inherently not codable concepts without codable children: Secondary | ICD-10-CM

## 2018-05-15 NOTE — Progress Notes (Signed)
Subjective: 77 y.o. year old male patient presents complaining of painful nails. Patient requests toe nails trimmed.  2nd toe nails grow abnormally on both feet and hurts in shoes. Denies any new problems.  Objective: Dermatologic: Thick yellow deformed nails x 10. Thick vertical nail growth with fungal debris 2nd toe nail bilateral. Vascular: Pedal pulses are all palpable. Orthopedic: No gross deformities. Neurologic: All epicritic and tactile sensations grossly intact.  Assessment: Dystrophic mycotic nails x 10. Abnormal nail growth 2nd bilateral. Painful nails.  Treatment: All mycotic nails debrided.  Return in 3 months or as needed.

## 2018-05-20 ENCOUNTER — Other Ambulatory Visit: Payer: Self-pay | Admitting: Neurology

## 2018-05-21 DIAGNOSIS — R310 Gross hematuria: Secondary | ICD-10-CM

## 2018-05-21 HISTORY — DX: Gross hematuria: R31.0

## 2018-05-22 DIAGNOSIS — H2513 Age-related nuclear cataract, bilateral: Secondary | ICD-10-CM | POA: Diagnosis not present

## 2018-05-23 ENCOUNTER — Ambulatory Visit (INDEPENDENT_AMBULATORY_CARE_PROVIDER_SITE_OTHER): Payer: Medicare Other

## 2018-05-23 ENCOUNTER — Ambulatory Visit: Payer: Medicare Other | Admitting: Family Medicine

## 2018-05-23 DIAGNOSIS — Z23 Encounter for immunization: Secondary | ICD-10-CM

## 2018-05-28 DIAGNOSIS — R310 Gross hematuria: Secondary | ICD-10-CM | POA: Diagnosis not present

## 2018-05-28 DIAGNOSIS — R8271 Bacteriuria: Secondary | ICD-10-CM | POA: Diagnosis not present

## 2018-05-29 ENCOUNTER — Encounter: Payer: Self-pay | Admitting: Family Medicine

## 2018-06-04 ENCOUNTER — Encounter: Payer: Self-pay | Admitting: Radiology

## 2018-06-05 ENCOUNTER — Encounter: Payer: Self-pay | Admitting: Family Medicine

## 2018-06-05 ENCOUNTER — Other Ambulatory Visit: Payer: Medicare Other

## 2018-06-05 ENCOUNTER — Other Ambulatory Visit: Payer: Self-pay | Admitting: *Deleted

## 2018-06-05 MED ORDER — ATORVASTATIN CALCIUM 20 MG PO TABS: 20.0000 mg | ORAL_TABLET | Freq: Every day | ORAL | 1 refills | Status: DC

## 2018-06-06 ENCOUNTER — Encounter: Payer: Self-pay | Admitting: Radiology

## 2018-06-07 ENCOUNTER — Ambulatory Visit (INDEPENDENT_AMBULATORY_CARE_PROVIDER_SITE_OTHER): Payer: Medicare Other | Admitting: Family Medicine

## 2018-06-07 ENCOUNTER — Encounter: Payer: Self-pay | Admitting: Family Medicine

## 2018-06-07 VITALS — BP 125/72 | HR 65 | Temp 97.8°F | Resp 16 | Ht 63.0 in | Wt 144.5 lb

## 2018-06-07 DIAGNOSIS — I10 Essential (primary) hypertension: Secondary | ICD-10-CM

## 2018-06-07 DIAGNOSIS — E7800 Pure hypercholesterolemia, unspecified: Secondary | ICD-10-CM | POA: Diagnosis not present

## 2018-06-07 MED ORDER — ATORVASTATIN CALCIUM 20 MG PO TABS: 20.0000 mg | ORAL_TABLET | Freq: Every day | ORAL | 1 refills | Status: DC

## 2018-06-07 NOTE — Progress Notes (Signed)
OFFICE VISIT  06/07/2018   CC:  Chief Complaint  Patient presents with  . Follow-up    RCI, pt is not fasting.    HPI:    Patient is a 77 y.o. Caucasian male who presents for 6 mo f/u HTN, HLD, hx of lung ca with brain mets. Says he is doing well.  HTN: he has no home monitor, but other MD visits bp's normal.   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 He says he is eating low fat/low chol diet.  Increased veggies and fruits.  HLD: see diet info above.  Tolerating statin w/out problem.  He gets a brain scan 06/2018 to recheck for mets (1 yr f/u scan). He has not had any seizures in quite a while.  Past Medical History:  Diagnosis Date  . AAA (abdominal aortic aneurysm) (HCC)    s/p repair (aortoiliac bipass; with persistent endograft leak in inferior mesenteric artery)--stable as of 01/2017 vascular f/u.  Marland Kitchen Asymptomatic cholelithiasis 07/2015   Incidental finding on PET CT  . Back pain 04/19/2016  . BPH with elevated PSA    PSA signif rise 11/2017 (5.4 in 2015 to 31.21 November 2017.  Urol (Dr. Lovena Neighbours) to do MRI prostate, then potentially prostate bx.  . Coronary artery disease    MI 1992, S/P  PTCA; negative stress test in November 2011 with no ischemia.   . Diverticulosis   . Elevated PSA 01/2018   26.5.  Prostate MRI with abnormality->subsequent prostate bx BENIGN 04/12/18  . Gross hematuria 05/2018   Occurred 2+ mo's after prostate bx: attributed to bx by urol, treated empirically with keflex.  Marland Kitchen Hearing loss    Bilateral   . History of radiation therapy 11/10/13-12/12/13   lung,50Gy/88fx  . Hyperlipidemia    Crestor= myalgias  . Hypertension   . Lung cancer ([place]) 05/2013   non-small cell;  L upper lobectomy with mediastinal LN dissection, chemo, and radiation in 2014/2015. Remission until pt had questionable seizure 07/2015--MRI showed brain mets; palliative brain rad (stereotactic radiation therapy) started 08/25/15.  CT C/A/P clear 02/2016,  05/2016, and 11/2016.  Plan per onc is to repeat in 6 mo.  . Malignant neoplasm of upper lobe, left bronchus or lung ([place]) 10/29/2013   ?New spiculated 4.5cm mass in L upper lung field on CXR at [facility] in [place] 07/14/17.  Dr. Julien Nordmann (onc) recommended f/u CXR after abx course.  If mass unchanged then repeat CT chest.  . Malignant neoplasm of upper lobe, left bronchus or lung ([place]) 06/26/2013  . Myocardial infarction [facility]) 1992   Dr Angelena Form    . Peripheral vascular disease ([place]) 08/2012   4.8x4.6 cm infrarenal abdominal aortic fusiform aneurysm, 1.5 cm right common iliac artery aneurysm.  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  . S/P radiation therapy [facility] 08/25/15   Stereotactic radiation therapy: frontoparietal 18gy,posterior frontal lobe 20gy,  . Seizure disorder ([place]) 2016/2017   as sequela of brain mets;  Grand mal seizure 07/2015, then got on keppra and was seizure-free until focal motor seizures of L side of face began 02/2016- these responded well to up-titration of keppra but pt had adverse side effects so eventually pt had to be switched over to lamictal 07/2016--stable/seizure free on this med as of 01/2018 neuro f/u.  Marland Kitchen Shortness of breath   . Tobacco dependence    "Quit" 1992,  but pt has smoked "on and off" since that time    Past Surgical History:  Procedure Laterality Date  . ABDOMINAL AORTIC ENDOVASCULAR STENT GRAFT N/A 05/07/2014   Procedure: ABDOMINAL AORTIC ENDOVASCULAR STENT GRAFT;  Surgeon: Serafina Mitchell, MD;  Location: [facility] OR;  Service: Vascular;  Laterality: N/A;  . Carotid duplex dopplers  07/2015   1-39% on R, no signif dz noted on L  . COLONOSCOPY W/ POLYPECTOMY  2003    negative 2010,due 2020; Dr Olevia Perches  . CORONARY ANGIOPLASTY     no stents  . CYSTOSCOPY/RETROGRADE/URETEROSCOPY Bilateral 10/09/2012   Procedure: BILATERAL RETROGRADE bladder and  urethral BIOPSY ;  Surgeon: Molli Hazard, MD;  [facility]: [facility];  Service: Urology;  Laterality: Bilateral;  BILATERAL RETROGRADE   . EEG  08/05/15   Pt placed on keppra just prior to this test due to having ? seizure (MRI showed brain mets)  . HERNIA REPAIR Bilateral    Inguinal  . INGUINAL HERIIORRHAPHY BILATERALLY    . IR ANGIOGRAM SELECTIVE EACH ADDITIONAL VESSEL  12/03/2017  . IR ANGIOGRAM VISCERAL SELECTIVE  12/03/2017  . IR ANGIOGRAM VISCERAL SELECTIVE  12/03/2017  . IR EMBO ARTERIAL NOT HEMORR HEMANG INC GUIDE ROADMAPPING  12/03/2017  . IR GENERIC HISTORICAL  07/20/2016   IR RADIOLOGIST EVAL & MGMT 07/20/2016 [facility] INTERV RAD  . IR GENERIC HISTORICAL  08/02/2016   IR RADIOLOGIST EVAL & MGMT 08/02/2016 Sandi Mariscal, MD [facility] INTERV RAD  . IR RADIOLOGIST EVAL & MGMT  11/08/2017  . IR RADIOLOGIST EVAL & MGMT  01/01/2018  . IR US GUIDE VASC ACCESS RIGHT  12/03/2017  . MEDIASTINOSCOPY N/A 05/01/2013   Procedure: MEDIASTINOSCOPY;  Surgeon: Gaye Pollack, MD;  Location: [facility] OR;  Service: Thoracic;  Laterality: N/A;  . PFTs  04/2013   Minimal obstructive airway disease  . PILONIDAL CYST EXCISION    . PROSTATE BIOPSY N/A 10/09/2012   NEG bx 04/12/18 as well.  Procedure: PROSTATIC URETHRAL BIOPSY--BPH--no evidence of malignancy;  Surgeon: Molli Hazard, MD;  [facility];  Service: Urology;  Laterality: N/A;  PROSTATIC URETHRAL BIOPSY  . PROSTATE BIOPSY  03/2018   NEG  . PTCA  1992  . ROTATOR CUFF REPAIR     Bilateral  . THOROCOTOMY WITH LOBECTOMY Left 05/29/2013   Procedure: LEFT THOROCOTOMY WITH LEFT UPPER LOBE LOBECTOMY;  Surgeon: Gaye Pollack, MD;  Location: [place];  Service: Thoracic;  Laterality: Left;  Marland Kitchen VIDEO BRONCHOSCOPY N/A 05/01/2013   Procedure: VIDEO BRONCHOSCOPY;  Surgeon: Gaye Pollack, MD;  Location: [facility] OR;  Service: Thoracic;  Laterality: N/A;    Outpatient Medications Prior to Visit  Medication Sig Dispense Refill  . amLODipine (NORVASC) 5 MG tablet  TAKE 1 TABLET BY MOUTH EVERY DAY 90 tablet 1  . aspirin EC 81 MG tablet Take 81 mg by mouth at bedtime.     . budesonide-formoterol (SYMBICORT) 160-4.5 MCG/ACT inhaler INHALE 1-2 PUFFS INTO THE LINGS EVERY 12 HOURS. GARGLE AND SPIT AFTER USE. 10.2 Inhaler 3  . lamoTRIgine (LAMICTAL) 200 MG tablet TAKE 1 TABLET BY MOUTH IN THE MORNING AND 1.5 TABLETS IN THE EVENING 72 tablet 5  . metoprolol tartrate (LOPRESSOR) 50 MG tablet TAKE 1 TABLET BY MOUTH TWICE A DAY 180 tablet 1  . Multiple Vitamin (MULTIVITAMIN) tablet Take 1 tablet by mouth daily.     . pentoxifylline (TRENTAL) 400 MG CR tablet Take 1 tablet (400 mg total) by mouth 3 (three) times daily. 90 tablet 5  .  atorvastatin (LIPITOR) 20 MG tablet Take 1 tablet (20 mg total) by mouth daily. 90 tablet 1  . diphenhydrAMINE (BENADRYL) 50 MG tablet Take 1 tablet (50 mg total) by mouth as directed. Take 1 tablet (50 mg) by mouth 1 hr prior to CTA (scheduled for 11/08/2017) (Patient not taking: Reported on 01/28/2018) 1 tablet 0  . predniSONE (DELTASONE) 50 MG tablet Take 1 tablet (50 mg total) by mouth as directed. Take Prednisone 50 mg by mouth 13 hrs, 7 hrs and 1 hr prior to CTA (scheduled 11/08/2017). (Patient not taking: Reported on 06/07/2018) 3 tablet 0  . vitamin E 1000 UNIT capsule Take 1 capsule (1,000 Units total) by mouth daily. 30 capsule 12   No facility-administered medications prior to visit.     Allergies  Allergen Reactions  . Fluvirin [Influenza Vac Split Quad] Nausea And Vomiting and Other (See Comments)    Seizure like activity?  Marland Kitchen Rosuvastatin Other (See Comments)    Myalgias and dark urine  . Iodinated Diagnostic Agents Hives    1 hive on lt cheek lasting approximately 1 hour on last 2 CT per pt; needs pre meds in future; 50 mg benadryl po 1 hr prior to exam per Dr. Weber Cooks 07/2014  11/2016 per Tery Sanfilippo, pt needs 13hr premeds per our policy.    ROS As per HPI  PE: Blood pressure 125/72, pulse 65, temperature 97.8 F (36.6  C), temperature source Oral, resp. rate 16, height 5\' 3"  (1.6 m), weight 144 lb 8 oz (65.5 kg), SpO2 96 %. Gen: Alert, well appearing.  Patient is oriented to person, place, time, and situation. AFFECT: pleasant, lucid thought and speech. CV: RRR, no m/r/g.   LUNGS: CTA bilat, nonlabored resps, good aeration in all lung fields. EXT: no clubbing or cyanosis.  no edema.    LABS:  Lab Results  Component Value Date   CHOL 142 12/14/2017   HDL 51.90 12/14/2017   LDLCALC 77 12/14/2017   TRIG 70.0 12/14/2017   CHOLHDL 3 12/14/2017     Chemistry      Component Value Date/Time   NA 143 12/14/2017 1034   NA 141 06/18/2017 1006   K 5.1 12/14/2017 1034   K 5.3 (H) 06/18/2017 1006   CL 106 12/14/2017 1034   CO2 29 12/14/2017 1034   CO2 25 06/18/2017 1006   BUN 12 12/14/2017 1034   BUN 14.3 06/18/2017 1006   CREATININE 0.93 12/14/2017 1034   CREATININE 0.9 06/18/2017 1006      Component Value Date/Time   CALCIUM 9.7 12/14/2017 1034   CALCIUM 10.3 06/18/2017 1006   ALKPHOS 61 12/14/2017 1034   ALKPHOS 63 06/18/2017 1006   AST 11 12/14/2017 1034   AST 15 06/18/2017 1006   ALT 11 12/14/2017 1034   ALT 17 06/18/2017 1006   BILITOT 0.4 12/14/2017 1034   BILITOT 0.35 06/18/2017 1006      IMPRESSION AND PLAN:  1) HTN: The current medical regimen is effective;  continue present plan and medications. Lytes/cr good 6 mo ago.  Will repeat these at CPE in 6 mo.  2) HLD: tolerating statin.  Lipid panel excellent 6  Mo ago. Plan repeat lipids 6 mo.  3) Hx of lung ca, with hx of brain mets: dz free as of last onc monitoring. Due for repeat scan of brain in about a month.  An After Visit Summary was printed and given to the patient.  FOLLOW UP: Return in about 6 months (around 12/07/2018) for annual  CPE (fasting).  Signed:  Crissie Sickles, MD           06/07/2018

## 2018-06-09 ENCOUNTER — Encounter: Payer: Self-pay | Admitting: Family Medicine

## 2018-06-09 DIAGNOSIS — L602 Onychogryphosis: Secondary | ICD-10-CM

## 2018-06-10 NOTE — Telephone Encounter (Signed)
OK, podiatry referral ordered.

## 2018-06-10 NOTE — Telephone Encounter (Signed)
Please advise. Thanks.  

## 2018-06-11 ENCOUNTER — Encounter: Payer: Self-pay | Admitting: Radiology

## 2018-06-13 DIAGNOSIS — R310 Gross hematuria: Secondary | ICD-10-CM | POA: Diagnosis not present

## 2018-06-18 ENCOUNTER — Other Ambulatory Visit: Payer: Self-pay | Admitting: *Deleted

## 2018-06-18 MED ORDER — BUDESONIDE-FORMOTEROL FUMARATE 160-4.5 MCG/ACT IN AERO: INHALATION_SPRAY | RESPIRATORY_TRACT | 3 refills | Status: DC

## 2018-06-23 ENCOUNTER — Encounter: Payer: Self-pay | Admitting: Internal Medicine

## 2018-06-24 ENCOUNTER — Ambulatory Visit: Payer: Medicare Other | Admitting: Podiatry

## 2018-06-24 VITALS — BP 160/89 | HR 74

## 2018-06-24 DIAGNOSIS — M79674 Pain in right toe(s): Secondary | ICD-10-CM

## 2018-06-24 DIAGNOSIS — M79675 Pain in left toe(s): Secondary | ICD-10-CM

## 2018-06-24 DIAGNOSIS — B351 Tinea unguium: Secondary | ICD-10-CM

## 2018-06-24 NOTE — Progress Notes (Signed)
Subjective:   Patient ID: Jordan Mccullough, male   DOB: 77 y.o.   MRN: [id number]   HPI 77 year old male presents the office today for concerns of thick, painful, elongated toenails that he cannot trim himself.  He has been under the care of Dr. Legrand Como for toenail trim every 3 months however since her office closed he is also here to establish care.  He has no changes since he last saw Dr. Caffie Pinto.  Denies any open sores.  No other concerns.   Review of Systems  All other systems reviewed and are negative.  Past Medical History:  Diagnosis Date  . AAA (abdominal aortic aneurysm) (HCC)    s/p repair (aortoiliac bipass; with persistent endograft leak in inferior mesenteric artery)--stable as of 01/2017 vascular f/u.  Marland Kitchen Asymptomatic cholelithiasis 07/2015   Incidental finding on PET CT  . Back pain 04/19/2016  . BPH with elevated PSA    PSA signif rise 11/2017 (5.4 in 2015 to 31.21 November 2017.  Urol (Dr. Lovena Neighbours) to do MRI prostate, then potentially prostate bx.  . Coronary artery disease    MI 1992, S/P  PTCA; negative stress test in November 2011 with no ischemia.   . Diverticulosis   . Elevated PSA 01/2018   26.5.  Prostate MRI with abnormality->subsequent prostate bx BENIGN 04/12/18  . Gross hematuria 05/2018   Occurred 2+ mo's after prostate bx: attributed to bx by urol, treated empirically with keflex.  Marland Kitchen Hearing loss    Bilateral   . History of radiation therapy 11/10/13-12/12/13   lung,50Gy/40fx  . Hyperlipidemia    Crestor= myalgias  . Hypertension   . Lung cancer ([place]) 05/2013   non-small cell;  L upper lobectomy with mediastinal LN dissection, chemo, and radiation in 2014/2015. Remission until pt had questionable seizure 07/2015--MRI showed brain mets; palliative brain rad (stereotactic radiation therapy) started 08/25/15.  CT C/A/P clear 02/2016, 05/2016, and 11/2016.  Plan per onc is to repeat in 6 mo.  . Malignant neoplasm of upper lobe, left bronchus or lung ([place]) 10/29/2013   ?New  spiculated 4.5cm mass in L upper lung field on CXR at [facility] in [place] 07/14/17.  Dr. Julien Nordmann (onc) recommended f/u CXR after abx course.  If mass unchanged then repeat CT chest.  . Malignant neoplasm of upper lobe, left bronchus or lung ([place]) 06/26/2013  . Myocardial infarction [facility]) 1992   Dr Angelena Form    . Peripheral vascular disease ([place]) 08/2012   4.8x4.6 cm infrarenal abdominal aortic fusiform aneurysm, 1.5 cm right common iliac artery aneurysm.  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  . S/P radiation therapy [facility] 08/25/15   Stereotactic radiation therapy: frontoparietal 18gy,posterior frontal lobe 20gy,  . Seizure disorder ([place]) 2016/2017   as sequela of brain mets;  Grand mal seizure 07/2015, then got on keppra and was seizure-free until focal motor seizures of L side of face began 02/2016- these responded well to up-titration of keppra but pt had adverse side effects so eventually pt had to be switched over to lamictal 07/2016--stable/seizure free on this med as of 01/2018 neuro f/u.  Marland Kitchen Shortness of breath   . Tobacco dependence    "Quit" 1992, but pt has smoked "on and off" since that time    Past Surgical History:  Procedure Laterality Date  . ABDOMINAL AORTIC ENDOVASCULAR STENT GRAFT N/A 05/07/2014   Procedure: ABDOMINAL AORTIC ENDOVASCULAR STENT  GRAFT;  Surgeon: Serafina Mitchell, MD;  Location: [facility] OR;  Service: Vascular;  Laterality: N/A;  . Carotid duplex dopplers  07/2015   1-39% on R, no signif dz noted on L  . COLONOSCOPY W/ POLYPECTOMY  2003    negative 2010,due 2020; Dr Olevia Perches  . CORONARY ANGIOPLASTY     no stents  . CYSTOSCOPY/RETROGRADE/URETEROSCOPY Bilateral 10/09/2012   Procedure: BILATERAL RETROGRADE bladder and urethral BIOPSY ;  Surgeon: Molli Hazard, MD;  [facility];  Service: Urology;  Laterality: Bilateral;  BILATERAL RETROGRADE   . EEG   08/05/15   Pt placed on keppra just prior to this test due to having ? seizure (MRI showed brain mets)  . HERNIA REPAIR Bilateral    Inguinal  . INGUINAL HERIIORRHAPHY BILATERALLY    . IR ANGIOGRAM SELECTIVE EACH ADDITIONAL VESSEL  12/03/2017  . IR ANGIOGRAM VISCERAL SELECTIVE  12/03/2017  . IR ANGIOGRAM VISCERAL SELECTIVE  12/03/2017  . IR EMBO ARTERIAL NOT HEMORR HEMANG INC GUIDE ROADMAPPING  12/03/2017  . IR GENERIC HISTORICAL  07/20/2016   IR RADIOLOGIST EVAL & MGMT 07/20/2016 [facility] INTERV RAD  . IR GENERIC HISTORICAL  08/02/2016   IR RADIOLOGIST EVAL & MGMT 08/02/2016 Sandi Mariscal, MD [facility] INTERV RAD  . IR RADIOLOGIST EVAL & MGMT  11/08/2017  . IR RADIOLOGIST EVAL & MGMT  01/01/2018  . IR US GUIDE VASC ACCESS RIGHT  12/03/2017  . MEDIASTINOSCOPY N/A 05/01/2013   Procedure: MEDIASTINOSCOPY;  Surgeon: Gaye Pollack, MD;  Location: [facility] OR;  Service: Thoracic;  Laterality: N/A;  . PFTs  04/2013   Minimal obstructive airway disease  . PILONIDAL CYST EXCISION    . PROSTATE BIOPSY N/A 10/09/2012   NEG bx 04/12/18 as well.  Procedure: PROSTATIC URETHRAL BIOPSY--BPH--no evidence of malignancy;  Surgeon: Molli Hazard, MD;  [facility];  Service: Urology;  Laterality: N/A;  PROSTATIC URETHRAL BIOPSY  . PROSTATE BIOPSY  03/2018   NEG  . PTCA  1992  . ROTATOR CUFF REPAIR     Bilateral  . THOROCOTOMY WITH LOBECTOMY Left 05/29/2013   Procedure: LEFT THOROCOTOMY WITH LEFT UPPER LOBE LOBECTOMY;  Surgeon: Gaye Pollack, MD;  Location: [facility];  Service: Thoracic;  Laterality: Left;  Marland Kitchen VIDEO BRONCHOSCOPY N/A 05/01/2013   Procedure: VIDEO BRONCHOSCOPY;  Surgeon: Gaye Pollack, MD;  Location[facility] OR;  Service: Thoracic;  Laterality: N/A;     Current Outpatient Medications:  .  amLODipine (NORVASC) 5 MG tablet, TAKE 1 TABLET BY MOUTH EVERY DAY, Disp: 90 tablet, Rfl: 1 .  aspirin EC 81 MG tablet, Take 81 mg by mouth at bedtime. , Disp: , Rfl:  .  atorvastatin (LIPITOR) 20 MG tablet, Take 1  tablet (20 mg total) by mouth daily., Disp: 90 tablet, Rfl: 1 .  budesonide-formoterol (SYMBICORT) 160-4.5 MCG/ACT inhaler, INHALE 1-2 PUFFS INTO THE LINGS EVERY 12 HOURS. GARGLE AND SPIT AFTER USE., Disp: 10.2 Inhaler, Rfl: 3 .  diphenhydrAMINE (BENADRYL) 50 MG tablet, Take 1 tablet (50 mg total) by mouth as directed. Take 1 tablet (50 mg) by mouth 1 hr prior to CTA (scheduled for 11/08/2017), Disp: 1 tablet, Rfl: 0 .  lamoTRIgine (LAMICTAL) 200 MG tablet, TAKE 1 TABLET BY MOUTH IN THE MORNING AND 1.5 TABLETS IN THE EVENING, Disp: 72 tablet, Rfl: 5 .  metoprolol tartrate (LOPRESSOR) 50 MG tablet, TAKE 1 TABLET BY MOUTH TWICE A DAY, Disp: 180 tablet, Rfl: 1 .  Multiple Vitamin (MULTIVITAMIN) tablet, Take 1 tablet by mouth  daily. , Disp: , Rfl:  .  pentoxifylline (TRENTAL) 400 MG CR tablet, Take 1 tablet (400 mg total) by mouth 3 (three) times daily., Disp: 90 tablet, Rfl: 5 .  predniSONE (DELTASONE) 50 MG tablet, Take 1 tablet (50 mg total) by mouth as directed. Take Prednisone 50 mg by mouth 13 hrs, 7 hrs and 1 hr prior to CTA (scheduled 11/08/2017)., Disp: 3 tablet, Rfl: 0  Allergies  Allergen Reactions  . Fluvirin [Influenza Vac Split Quad] Nausea And Vomiting and Other (See Comments)    Seizure like activity?  Marland Kitchen Rosuvastatin Other (See Comments)    Myalgias and dark urine  . Iodinated Diagnostic Agents Hives    1 hive on lt cheek lasting approximately 1 hour on last 2 CT per pt; needs pre meds in future; 50 mg benadryl po 1 hr prior to exam per Dr. Weber Cooks 07/2014  11/2016 per Tery Sanfilippo, pt needs 13hr premeds per our policy.    Social History   Socioeconomic History  . Marital status: Married    Spouse name: Not on file  . Number of children: Not on file  . Years of education: Not on file  . Highest education level: Not on file  Occupational History  . Occupation: Retired Scientist, clinical (histocompatibility and immunogenetics)  . Financial resource strain: Not on file  . Food insecurity:    Worry: Not on  file    Inability: Not on file  . Transportation needs:    Medical: Not on file    Non-medical: Not on file  Tobacco Use  . Smoking status: Current Every Day Smoker    Packs/day: 1.00    Years: 24.00    Pack years: 24.00    Types: Cigarettes    Last attempt to quit: 05/28/2013    Years since quitting: 5.0  . Smokeless tobacco: Never Used  . Tobacco comment: QUIT 15 YEARS AGO-07/03/17 smoking  Substance and Sexual Activity  . Alcohol use: Yes    Alcohol/week: 15.0 standard drinks    Types: 3 Glasses of wine, 12 Cans of beer per week    Comment: 1-2 beers a day  . Drug use: No  . Sexual activity: Not on file  Lifestyle  . Physical activity:    Days per week: Not on file    Minutes per session: Not on file  . Stress: Not on file  Relationships  . Social connections:    Talks on phone: Not on file    Gets together: Not on file    Attends religious service: Not on file    Active member of club or organization: Not on file    Attends meetings of clubs or organizations: Not on file    Relationship status: Not on file  . Intimate partner violence:    Fear of current or ex partner: Not on file    Emotionally abused: Not on file    Physically abused: Not on file    Forced sexual activity: Not on file  Other Topics Concern  . Not on file  Social History Narrative   HEART HEALTHY DIET.     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   MARRIED, 2 children who live in [place].  Smoked on and off since then.   ALCOHOL USE -YES- RED WINE SOCIALLY, couple of beers at night usually.       Objective:  Physical Exam  General: AAO x3, NAD  Dermatological: Nails are hypertrophic, dystrophic, brittle, discolored, elongated 10.  No surrounding redness or drainage. Tenderness nails 1-5 bilaterally. No open lesions or pre-ulcerative lesions are identified today.  Vascular: Dorsalis Pedis artery and Posterior Tibial artery pedal pulses are 2/4 bilateral with immedate capillary fill  time.  There is no pain with calf compression, swelling, warmth, erythema.   Neruologic: Grossly intact via light touch bilateral. Protective threshold with Semmes Wienstein monofilament intact to all pedal sites bilateral.   Musculoskeletal: No gross boney pedal deformities bilateral. No pain, crepitus, or limitation noted with foot and ankle range of motion bilateral. Muscular strength 5/5 in all groups tested bilateral.    Assessment:  77 year old male with symptomatic onychomycosis       Plan:   -Treatment options discussed including all alternatives, risks, and complications -Etiology of symptoms were discussed -Nails debrided 10 without complications or bleeding. -Daily foot inspection -Follow-up in 3 months or sooner if any problems arise. In the meantime, encouraged to call the office with any questions, concerns, change in symptoms.   Celesta Gentile, DPM

## 2018-06-26 NOTE — Progress Notes (Signed)
Glynis Smiles. Gehring 77 y.o. man with metastatic left lung cancer SRS to brain 08-25-15 review  07-04-18 MRI I brain w wo contrast.   Headache:No Pain:No Dizziness:No Nausea/vomiting:No Ringing in ears:No Visual changes (Blurred/ diplopia double vision,blind spots, and peripheral vsion changes):Wears glasses, no problems today. Fatigue:Yes awakes around 0300 in the morning.  Goes to bed early around 2000. Cognitive changes:Alert and oriented x 4. Wt Readings from Last 3 Encounters:  07/08/18 144 lb (65.3 kg)  06/07/18 144 lb 8 oz (65.5 kg)  01/28/18 145 lb (65.8 kg)  BP (!) 146/87 (BP Location: Left Arm, Patient Position: Sitting)   Pulse 73   Temp 98.4 F (36.9 C) (Oral)   Resp 18   Ht 5\' 3"  (1.6 m)   Wt 144 lb (65.3 kg)   SpO2 100%   BMI 25.51 kg/m

## 2018-06-27 ENCOUNTER — Encounter: Payer: Self-pay | Admitting: Internal Medicine

## 2018-06-27 ENCOUNTER — Other Ambulatory Visit: Payer: Self-pay | Admitting: Medical Oncology

## 2018-06-27 DIAGNOSIS — Z91041 Radiographic dye allergy status: Secondary | ICD-10-CM

## 2018-06-27 MED ORDER — PREDNISONE 50 MG PO TABS: 50.0000 mg | ORAL_TABLET | ORAL | 0 refills | Status: DC

## 2018-06-30 ENCOUNTER — Encounter: Payer: Self-pay | Admitting: Family Medicine

## 2018-07-02 ENCOUNTER — Inpatient Hospital Stay: Payer: Medicare Other | Attending: Internal Medicine

## 2018-07-02 ENCOUNTER — Ambulatory Visit (HOSPITAL_COMMUNITY)
Admission: RE | Admit: 2018-07-02 | Discharge: 2018-07-02 | Disposition: A | Discharge status: Home / Self Care | Payer: Medicare Other | Source: Ambulatory Visit | Attending: Internal Medicine | Admitting: Internal Medicine

## 2018-07-02 DIAGNOSIS — Z79899 Other long term (current) drug therapy: Secondary | ICD-10-CM | POA: Insufficient documentation

## 2018-07-02 DIAGNOSIS — J439 Emphysema, unspecified: Secondary | ICD-10-CM | POA: Insufficient documentation

## 2018-07-02 DIAGNOSIS — I700 Atherosclerosis of aorta: Secondary | ICD-10-CM | POA: Diagnosis not present

## 2018-07-02 DIAGNOSIS — I252 Old myocardial infarction: Secondary | ICD-10-CM | POA: Diagnosis not present

## 2018-07-02 DIAGNOSIS — C3490 Malignant neoplasm of unspecified part of unspecified bronchus or lung: Secondary | ICD-10-CM | POA: Insufficient documentation

## 2018-07-02 DIAGNOSIS — E785 Hyperlipidemia, unspecified: Secondary | ICD-10-CM | POA: Insufficient documentation

## 2018-07-02 DIAGNOSIS — I2510 Atherosclerotic heart disease of native coronary artery without angina pectoris: Secondary | ICD-10-CM | POA: Diagnosis not present

## 2018-07-02 DIAGNOSIS — C3412 Malignant neoplasm of upper lobe, left bronchus or lung: Secondary | ICD-10-CM | POA: Diagnosis not present

## 2018-07-02 DIAGNOSIS — Z923 Personal history of irradiation: Secondary | ICD-10-CM | POA: Diagnosis not present

## 2018-07-02 DIAGNOSIS — R319 Hematuria, unspecified: Secondary | ICD-10-CM | POA: Insufficient documentation

## 2018-07-02 DIAGNOSIS — I739 Peripheral vascular disease, unspecified: Secondary | ICD-10-CM | POA: Insufficient documentation

## 2018-07-02 DIAGNOSIS — Z7982 Long term (current) use of aspirin: Secondary | ICD-10-CM | POA: Insufficient documentation

## 2018-07-02 DIAGNOSIS — F17210 Nicotine dependence, cigarettes, uncomplicated: Secondary | ICD-10-CM | POA: Insufficient documentation

## 2018-07-02 DIAGNOSIS — G40909 Epilepsy, unspecified, not intractable, without status epilepticus: Secondary | ICD-10-CM | POA: Insufficient documentation

## 2018-07-02 DIAGNOSIS — N400 Enlarged prostate without lower urinary tract symptoms: Secondary | ICD-10-CM | POA: Insufficient documentation

## 2018-07-02 DIAGNOSIS — C7931 Secondary malignant neoplasm of brain: Secondary | ICD-10-CM | POA: Insufficient documentation

## 2018-07-02 DIAGNOSIS — I10 Essential (primary) hypertension: Secondary | ICD-10-CM | POA: Diagnosis not present

## 2018-07-02 DIAGNOSIS — I714 Abdominal aortic aneurysm, without rupture: Secondary | ICD-10-CM | POA: Diagnosis not present

## 2018-07-02 LAB — COMPREHENSIVE METABOLIC PANEL
ALT: 20 U/L (ref 0–44)
AST: 16 U/L (ref 15–41)
Albumin: 3.9 g/dL (ref 3.5–5.0)
Alkaline Phosphatase: 66 U/L (ref 38–126)
Anion gap: 9 (ref 5–15)
BUN: 14 mg/dL (ref 8–23)
CO2: 25 mmol/L (ref 22–32)
Calcium: 10 mg/dL (ref 8.9–10.3)
Chloride: 107 mmol/L (ref 98–111)
Creatinine, Ser: 1.05 mg/dL (ref 0.61–1.24)
GFR calc Af Amer: >60 mL/min (ref >60)
GFR calc non Af Amer: >60 mL/min (ref >60)
Glucose, Bld: 178 mg/dL — ABNORMAL HIGH (ref 70–99)
Potassium: 5 mmol/L (ref 3.5–5.1)
Sodium: 141 mmol/L (ref 135–145)
Total Bilirubin: 0.3 mg/dL (ref 0.3–1.2)
Total Protein: 7.3 g/dL (ref 6.5–8.1)

## 2018-07-02 LAB — CBC WITH DIFFERENTIAL/PLATELET
Abs Immature Granulocytes: 0.02 10*3/uL (ref 0.00–0.07)
Basophils Absolute: 0 10*3/uL (ref 0.0–0.1)
Basophils Relative: 0 %
Eosinophils Absolute: 0 10*3/uL (ref 0.0–0.5)
Eosinophils Relative: 0 %
HCT: 44.6 % (ref 39.0–52.0)
Hemoglobin: 15 g/dL (ref 13.0–17.0)
Immature Granulocytes: 0 %
Lymphocytes Relative: 10 %
Lymphs Abs: 0.8 10*3/uL (ref 0.7–4.0)
MCH: 29.2 pg (ref 26.0–34.0)
MCHC: 33.6 g/dL (ref 30.0–36.0)
MCV: 86.8 fL (ref 80.0–100.0)
Monocytes Absolute: 0.1 10*3/uL (ref 0.1–1.0)
Monocytes Relative: 1 %
Neutro Abs: 7.4 10*3/uL (ref 1.7–7.7)
Neutrophils Relative %: 89 %
Platelets: 245 10*3/uL (ref 150–400)
RBC: 5.14 MIL/uL (ref 4.22–5.81)
RDW: 14.6 % (ref 11.5–15.5)
WBC: 8.3 10*3/uL (ref 4.0–10.5)
nRBC: 0 % (ref 0.0–0.2)

## 2018-07-02 MED ORDER — IOHEXOL 300 MG/ML  SOLN
75.0000 mL | Freq: Once | INTRAMUSCULAR | Status: AC | PRN
  Administered 2018-07-02: 75 mL via INTRAVENOUS

## 2018-07-02 MED ORDER — SODIUM CHLORIDE (PF) 0.9 % IJ SOLN
INTRAMUSCULAR | Status: AC
  Filled 2018-07-02: qty 50

## 2018-07-03 ENCOUNTER — Other Ambulatory Visit: Payer: Self-pay | Admitting: Radiation Therapy

## 2018-07-04 ENCOUNTER — Ambulatory Visit
Admission: RE | Admit: 2018-07-04 | Discharge: 2018-07-04 | Disposition: A | Discharge status: Home / Self Care | Payer: Medicare Other | Source: Ambulatory Visit | Attending: Radiation Oncology | Admitting: Radiation Oncology

## 2018-07-04 DIAGNOSIS — C7931 Secondary malignant neoplasm of brain: Secondary | ICD-10-CM

## 2018-07-04 DIAGNOSIS — C3490 Malignant neoplasm of unspecified part of unspecified bronchus or lung: Secondary | ICD-10-CM | POA: Diagnosis not present

## 2018-07-04 DIAGNOSIS — C7949 Secondary malignant neoplasm of other parts of nervous system: Secondary | ICD-10-CM

## 2018-07-04 MED ORDER — GADOBENATE DIMEGLUMINE 529 MG/ML IV SOLN
14.0000 mL | Freq: Once | INTRAVENOUS | Status: AC | PRN
  Administered 2018-07-04: 14 mL via INTRAVENOUS

## 2018-07-08 ENCOUNTER — Ambulatory Visit
Admission: RE | Admit: 2018-07-08 | Discharge: 2018-07-08 | Disposition: A | Discharge status: Home / Self Care | Payer: Medicare Other | Source: Ambulatory Visit | Attending: Radiation Oncology | Admitting: Radiation Oncology

## 2018-07-08 ENCOUNTER — Encounter: Payer: Self-pay | Admitting: Radiation Oncology

## 2018-07-08 ENCOUNTER — Other Ambulatory Visit: Payer: Self-pay

## 2018-07-08 VITALS — BP 146/87 | HR 73 | Temp 98.4°F | Resp 18 | Ht 63.0 in | Wt 144.0 lb

## 2018-07-08 DIAGNOSIS — R569 Unspecified convulsions: Secondary | ICD-10-CM | POA: Diagnosis not present

## 2018-07-08 DIAGNOSIS — I10 Essential (primary) hypertension: Secondary | ICD-10-CM | POA: Insufficient documentation

## 2018-07-08 DIAGNOSIS — R972 Elevated prostate specific antigen [PSA]: Secondary | ICD-10-CM | POA: Diagnosis not present

## 2018-07-08 DIAGNOSIS — C3412 Malignant neoplasm of upper lobe, left bronchus or lung: Secondary | ICD-10-CM | POA: Diagnosis not present

## 2018-07-08 DIAGNOSIS — Z923 Personal history of irradiation: Secondary | ICD-10-CM | POA: Insufficient documentation

## 2018-07-08 DIAGNOSIS — T66XXXA Radiation sickness, unspecified, initial encounter: Secondary | ICD-10-CM | POA: Insufficient documentation

## 2018-07-08 DIAGNOSIS — C7931 Secondary malignant neoplasm of brain: Secondary | ICD-10-CM | POA: Insufficient documentation

## 2018-07-08 DIAGNOSIS — F17210 Nicotine dependence, cigarettes, uncomplicated: Secondary | ICD-10-CM | POA: Diagnosis not present

## 2018-07-08 DIAGNOSIS — Z08 Encounter for follow-up examination after completed treatment for malignant neoplasm: Secondary | ICD-10-CM | POA: Diagnosis not present

## 2018-07-08 NOTE — Progress Notes (Signed)
Radiation Oncology         [phone] ________________________________  Name: WITTEN CERTAIN MRN: [id number]  Date: 07/08/2018  DOB: [birth year]  Follow-Up Visit Note  CC: McGowen, Adrian Blackwater, MD  Gaye Pollack, MD  Diagnosis:   Metastatic Lung Cancer  Interval Since Radiotherapy: almost 3 years  08/25/15 SRS Treatment:    1.  PTV1 Rt Vertex Frontoparietal 76mm target was treated using 4 Arcs to a prescription dose of 18 Gy. ExacTrac Snap verification was performed for each couch angle.  2.  PTV2  Rt Posterior Frontal Lobe 45mm target was treated using 4 Arcs to a prescription dose of 20 Gy. ExacTrac Snap verification was performed for each couch angle.   Narrative:   Mr. Finnan is a very pleasant 77 y.o. gentleman with a history of metastatic non-small cell lung cancer who received SRS to 2 lesions in the frontoparietal region which he completed in January of 2017. He has had stability in the brain since his treatment. He also has done well systemically and is to see Dr. Julien Nordmann tomorrow. His recent CT imaging of the chest showed no concerns for recurrent disease.  He underwent an MRI of the brain on 07/04/18 which showed stability of his previously treated disease. He has still been taking Trental for radionecrosis despite that we planned to discontinue this last time.  He also continues on Lamictal under the care of Dr. Delice Lesch. He has been recently seen by Dr. Lovena Neighbours for an elevated PSA. His PSA was recently 35 and he reports having an MRI guided biopsy that was negative. He comes today to review his recent MRI.  On review of systems, the patient reports that he is doing well overall. He denies any recent seizure activity, uncontrolled movements, headaches, or visual disturbances. 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 He reports he is not having any new symptoms or progressive weakness.  He denies any chest pain, shortness  of breath, cough, fevers, chills, night sweats, unintended weight changes. He denies any bowel or bladder disturbances, and denies abdominal pain, nausea or vomiting. He denies any new musculoskeletal or joint aches or pains, new skin lesions or concerns. A complete review of systems is obtained and is otherwise negative.  Past Medical History:  Past Medical History:  Diagnosis Date  . AAA (abdominal aortic aneurysm) (HCC)    s/p repair (aortoiliac bipass; with persistent endograft leak in inferior mesenteric artery)--stable as of 01/2017 vascular f/u.  Marland Kitchen Asymptomatic cholelithiasis 07/2015   Incidental finding on PET CT  . Back pain 04/19/2016  . BPH with elevated PSA    PSA signif rise 11/2017 (5.4 in 2015 to 31.21 November 2017.  Urol (Dr. Lovena Neighbours) to do MRI prostate, then potentially prostate bx.  . Coronary artery disease    MI 1992, S/P  PTCA; negative stress test in November 2011 with no ischemia.   . Diverticulosis   . Elevated PSA 01/2018   01/22/18:  PSA 26.5.  Prostate MRI with abnormality->subsequent prostate bx BENIGN 04/12/18.  Rpt PSA 03/07/18 stable at 24.1-->f/u 1 yr with urologist.  . [name] hematuria 05/2018   Occurred 2+ mo's after prostate bx: attributed to bx by urol, treated empirically with keflex.  Urol suspects this is bleeding was sequela of his recent prostate bx.  . Hearing loss    Bilateral   . History of radiation therapy 11/10/13-12/12/13   lung,50Gy/49fx  . Hyperlipidemia  Crestor= myalgias  . Hypertension   . Lung cancer ([place]) 05/2013   non-small cell;  L upper lobectomy with mediastinal LN dissection, chemo, and radiation in 2014/2015. Remission until pt had questionable seizure 07/2015--MRI showed brain mets; palliative brain rad (stereotactic radiation therapy) started 08/25/15.  CT C/A/P clear 02/2016, 05/2016, and 11/2016.  Plan per onc is to repeat in 6 mo.  . Malignant neoplasm of upper lobe, left bronchus or lung ([place]) 10/29/2013   ?New spiculated 4.5cm mass in  L upper lung field on CXR at [facility] in [place] 07/14/17.  Dr. Julien Nordmann (onc) recommended f/u CXR after abx course.  If mass unchanged then repeat CT chest.  . Malignant neoplasm of upper lobe, left bronchus or lung ([place]) 06/26/2013  . Myocardial infarction [facility]) 1992   Dr Angelena Form    . Peripheral vascular disease ([place]) 08/2012   4.8x4.6 cm infrarenal abdominal aortic fusiform aneurysm, 1.5 cm right common iliac artery aneurysm.  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  . S/P radiation therapy [facility] 08/25/15   Stereotactic radiation therapy: frontoparietal 18gy,posterior frontal lobe 20gy,  . Seizure disorder ([place]) 2016/2017   as sequela of brain mets;  Grand mal seizure 07/2015, then got on keppra and was seizure-free until focal motor seizures of L side of face began 02/2016- these responded well to up-titration of keppra but pt had adverse side effects so eventually pt had to be switched over to lamictal 07/2016--stable/seizure free on this med as of 01/2018 neuro f/u.  Marland Kitchen Shortness of breath   . Tobacco dependence    "Quit" 1992, but pt has smoked "on and off" since that time    Past Surgical History: Past Surgical History:  Procedure Laterality Date  . ABDOMINAL AORTIC ENDOVASCULAR STENT GRAFT N/A 05/07/2014   Procedure: ABDOMINAL AORTIC ENDOVASCULAR STENT GRAFT;  Surgeon: Serafina Mitchell, MD;  Location: [facility] OR;  Service: Vascular;  Laterality: N/A;  . Carotid duplex dopplers  07/2015   1-39% on R, no signif dz noted on L  . COLONOSCOPY W/ POLYPECTOMY  2003    negative 2010,due 2020; Dr Olevia Perches  . CORONARY ANGIOPLASTY     no stents  . CYSTOSCOPY/RETROGRADE/URETEROSCOPY Bilateral 10/09/2012   Procedure: BILATERAL RETROGRADE bladder and urethral BIOPSY ;  Surgeon: Molli Hazard, MD;  Location: [facility];  Service: Urology;  Laterality: Bilateral;  BILATERAL RETROGRADE   . EEG   08/05/15   Pt placed on keppra just prior to this test due to having ? seizure (MRI showed brain mets)  . HERNIA REPAIR Bilateral    Inguinal  . INGUINAL HERIIORRHAPHY BILATERALLY    . IR ANGIOGRAM SELECTIVE EACH ADDITIONAL VESSEL  12/03/2017  . IR ANGIOGRAM VISCERAL SELECTIVE  12/03/2017  . IR ANGIOGRAM VISCERAL SELECTIVE  12/03/2017  . IR EMBO ARTERIAL NOT HEMORR HEMANG INC GUIDE ROADMAPPING  12/03/2017  . IR GENERIC HISTORICAL  07/20/2016   IR RADIOLOGIST EVAL & MGMT 07/20/2016 [facility] INTERV RAD  . IR GENERIC HISTORICAL  08/02/2016   IR RADIOLOGIST EVAL & MGMT 08/02/2016 Sandi Mariscal, MD [facility] INTERV RAD  . IR RADIOLOGIST EVAL & MGMT  11/08/2017  . IR RADIOLOGIST EVAL & MGMT  01/01/2018  . IR US GUIDE VASC ACCESS RIGHT  12/03/2017  . MEDIASTINOSCOPY N/A 05/01/2013   Procedure: MEDIASTINOSCOPY;  Surgeon: Gaye Pollack, MD;  Location[facility] OR;  Service: Thoracic;  Laterality: N/A;  . PFTs  04/2013   Minimal obstructive airway disease  . PILONIDAL CYST EXCISION    . PROSTATE BIOPSY N/A 10/09/2012   NEG bx 04/12/18 as well.  Procedure: PROSTATIC URETHRAL BIOPSY--BPH--no evidence of malignancy;  Surgeon: Molli Hazard, MD;  Location: [facility];  Service: Urology;  Laterality: N/A;  PROSTATIC URETHRAL BIOPSY  . PROSTATE BIOPSY  03/2018   NEG  . PTCA  1992  . ROTATOR CUFF REPAIR     Bilateral  . THOROCOTOMY WITH LOBECTOMY Left 05/29/2013   Procedure: LEFT THOROCOTOMY WITH LEFT UPPER LOBE LOBECTOMY;  Surgeon: Gaye Pollack, MD;  Location: [facility];  Service: Thoracic;  Laterality: Left;  Marland Kitchen VIDEO BRONCHOSCOPY N/A 05/01/2013   Procedure: VIDEO BRONCHOSCOPY;  Surgeon: Gaye Pollack, MD;  Location: [facility] OR;  Service: Thoracic;  Laterality: N/A;    Social History:  Social History   Socioeconomic History  . Marital status: Married    Spouse name: Not on file  . Number of children: Not on file  . Years of education: Not on file  . Highest education level: Not on file  Occupational History  .  Occupation: Retired Scientist, clinical (histocompatibility and immunogenetics)  . Financial resource strain: Not on file  . Food insecurity:    Worry: Not on file    Inability: Not on file  . Transportation needs:    Medical: Not on file    Non-medical: Not on file  Tobacco Use  . Smoking status: Current Every Day Smoker    Packs/day: 1.00    Years: 24.00    Pack years: 24.00    Types: Cigarettes    Last attempt to quit: 05/28/2013    Years since quitting: 5.1  . Smokeless tobacco: Never Used  . Tobacco comment: QUIT 15 YEARS AGO-07/03/17 smoking  Substance and Sexual Activity  . Alcohol use: Yes    Alcohol/week: 15.0 standard drinks    Types: 3 Glasses of wine, 12 Cans of beer per week    Comment: 1-2 beers a day  . Drug use: No  . Sexual activity: Not on file  Lifestyle  . Physical activity:    Days per week: Not on file    Minutes per session: Not on file  . Stress: Not on file  Relationships  . Social connections:    Talks on phone: Not on file    Gets together: Not on file    Attends religious service: Not on file    Active member of club or organization: Not on file    Attends meetings of clubs or organizations: Not on file    Relationship status: Not on file  . Intimate partner violence:    Fear of current or ex partner: Not on file    Emotionally abused: Not on file    Physically abused: Not on file    Forced sexual activity: Not on file  Other Topics Concern  . Not on file  Social History Narrative   HEART HEALTHY DIET.     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   MARRIED, 2 children who live in [place].  Smoked on and off since then.   ALCOHOL USE -YES- RED WINE SOCIALLY, couple of beers at night usually.   07-08-18 Unable to ask abuse questions wife with him today.  The patient is from [place] originally. He enjoys flying planes but has not flown since his seizures. He does still do mechanical work on them.   Family History: Family History  Problem Relation Age of  Onset  . Hypertension Mother   . Prostate cancer Father        Prostate cancer  . Cancer Father        Brain Tumor  . Lung cancer Sister        NON SMOKER  . Cancer Sister   . Hyperlipidemia Sister   . Hyperlipidemia Brother   . Heart attack Brother   . Hypertension Brother   . Prostate cancer Brother   . Diabetes Neg Hx   . Stroke Neg Hx     ALLERGIES:  is allergic to fluvirin [influenza vac split quad]; rosuvastatin; and iodinated diagnostic agents.  Meds: Current Outpatient Medications  Medication Sig Dispense Refill  . amLODipine (NORVASC) 5 MG tablet TAKE 1 TABLET BY MOUTH EVERY DAY 90 tablet 1  . aspirin EC 81 MG tablet Take 81 mg by mouth at bedtime.     Marland Kitchen atorvastatin (LIPITOR) 20 MG tablet Take 1 tablet (20 mg total) by mouth daily. 90 tablet 1  . budesonide-formoterol (SYMBICORT) 160-4.5 MCG/ACT inhaler INHALE 1-2 PUFFS INTO THE LINGS EVERY 12 HOURS. GARGLE AND SPIT AFTER USE. 10.2 Inhaler 3  . diphenhydrAMINE (BENADRYL) 50 MG tablet Take 1 tablet (50 mg total) by mouth as directed. Take 1 tablet (50 mg) by mouth 1 hr prior to CTA (scheduled for 11/08/2017) 1 tablet 0  . lamoTRIgine (LAMICTAL) 200 MG tablet TAKE 1 TABLET BY MOUTH IN THE MORNING AND 1.5 TABLETS IN THE EVENING 72 tablet 5  . metoprolol tartrate (LOPRESSOR) 50 MG tablet TAKE 1 TABLET BY MOUTH TWICE A DAY 180 tablet 1  . Multiple Vitamin (MULTIVITAMIN) tablet Take 1 tablet by mouth daily.     . pentoxifylline (TRENTAL) 400 MG CR tablet Take 1 tablet (400 mg total) by mouth 3 (three) times daily. 90 tablet 5  . predniSONE (DELTASONE) 50 MG tablet Take 1 tablet (50 mg total) by mouth as directed. Take Prednisone 50 mg by mouth 13 hrs, 7 hrs and 1 hr prior to CTA (scheduled 11/08/2017). 3 tablet 0   No current facility-administered medications for this encounter.     Physical Findings:  height is 5\' 3"  (1.6 m) and weight is 144 lb (65.3 kg). His oral temperature is 98.4 F (36.9 C). His blood pressure is  146/87 (abnormal) and his pulse is 73. His respiration is 18 and oxygen saturation is 100%.  In general this is a well appearing Caucasian male in no acute distress. He's alert and oriented x4 and appropriate throughout the examination. Cardiopulmonary assessment is negative for acute distress and he exhibits normal effort. The patient appears to be neurologically intact without focal findings.  Lab Findings: Lab Results  Component Value Date   WBC 8.3 07/02/2018   HGB 15.0 07/02/2018   HCT 44.6 07/02/2018   MCV 86.8 07/02/2018   PLT 245 07/02/2018     Radiographic Findings: Ct Chest W Contrast  Result Date: 07/02/2018 CLINICAL DATA:  Remote history of lung cancer. EXAM: CT CHEST WITH CONTRAST TECHNIQUE: Multidetector CT imaging of the chest was performed during intravenous contrast administration. CONTRAST:  80mL OMNIPAQUE IOHEXOL 300 MG/ML  SOLN COMPARISON:  Chest CTs from 4/25 and 06/18/2017. FINDINGS: Cardiovascular: The heart is within normal limits in size and stable. No pericardial effusion. Small amount of residual fluid in the pericardial recesses. Stable mild tortuosity and scattered calcification of the thoracic aorta. No dissection. The branch vessels are patent. Stable coronary artery calcifications. Mediastinum/Nodes: No mediastinal or hilar  mass or lymphadenopathy. The esophagus is grossly normal. Lungs/Pleura: Stable surgical changes from a left upper lobe lobectomy. There also extensive radiation changes involving the left hilum and paramediastinal lung. I do not see any CT findings worrisome for recurrent tumor. Stable emphysematous changes and pulmonary scarring. No new pulmonary lesions or metastatic pulmonary nodules. No acute pulmonary findings. No pleural effusion. Upper Abdomen: No significant upper abdominal findings. No findings suspicious for hepatic or adrenal gland metastasis. Stable left renal cysts. Stable atherosclerotic calcifications involving the upper abdominal  aorta along with a aortic stent graft. Musculoskeletal: No chest wall mass, supraclavicular or axillary adenopathy. The thyroid gland appears normal. The bony thorax is intact. IMPRESSION: 1. Stable surgical changes and radiation changes involving the left hemithorax. No CT findings to suggest recurrent tumor. 2. No mediastinal or hilar adenopathy and no new pulmonary lesions or pulmonary nodules. 3. No findings to suggest upper abdominal metastatic disease. Aortic Atherosclerosis (ICD10-I70.0) and Emphysema (ICD10-J43.9). Electronically Signed   By: Marijo Sanes M.D.   On: 07/02/2018 15:54   Mr Jeri Cos QI Contrast  Result Date: 07/04/2018 CLINICAL DATA:  Metastatic lung cancer. Follow-up treatment with SRS. Creatinine was obtained on site at [facility] at [address] Results: Creatinine 1.1 mg/dL. EXAM: MRI HEAD WITHOUT AND WITH CONTRAST TECHNIQUE: Multiplanar, multiecho pulse sequences of the brain and surrounding structures were obtained without and with intravenous contrast. CONTRAST:  42mL MULTIHANCE GADOBENATE DIMEGLUMINE 529 MG/ML IV SOLN COMPARISON:  MRI head 12/20/2017, 08/23/2017 FINDINGS: Brain: Treated lesion right lateral parietal lobe is stable. Chronic hemorrhagic lesion with rim enhancement measures 12 x 19 mm unchanged. Surrounding white matter FLAIR hyperintensity stable. Treated lesion high right parietal lobe also unchanged. Chronic hemorrhagic lesion measuring 12 x 6 mm with surrounding FLAIR hyperintensity in the white matter No new enhancing lesions are identified. Mild atrophy. Mild chronic white matter changes stable. Vascular: Normal arterial flow void Skull and upper cervical spine: Negative Sinuses/Orbits: Negative Other: None IMPRESSION: Stable MRI. No recurrent or new metastatic deposit. 2 treated lesions right parietal lobe stable. Electronically Signed   By: Franchot Gallo M.D.   On: 07/04/2018 14:16    Impression/Plan: 1. Recurrent Metastatic Stage IIIA,  T2a, N2, M0, NSCLC, adenocarcinoma of the left upper lobe. Mr. Boehlke appears to be doing well radiographically and clinically. We discussed his MRI results today in the office which demonstrate continued stability and no new disease. He was counseled that we could discontinue the Trental as he still had been taking this despite previous discussion. He remains in observation with Dr. Julien Nordmann as well and we will see him back following his next 6 month MRI. The patient will contact us with questions or concerns prior to his next visit. 2. Seizure at presentation. He will continue Lamictal under the care of Dr. Delice Lesch.  3. Radionecrosis. We discussed discontinuing Trental and he will stop this medication as above. 4. Elevated PSA. The patient did have a negative biopsy but will continue to be followed by Dr. Gilford Rile at [facility]. We will follow this expectantly.    Carola Rhine, PAC

## 2018-07-09 ENCOUNTER — Encounter: Payer: Self-pay | Admitting: Internal Medicine

## 2018-07-09 ENCOUNTER — Inpatient Hospital Stay (HOSPITAL_BASED_OUTPATIENT_CLINIC_OR_DEPARTMENT_OTHER): Payer: Medicare Other | Admitting: Internal Medicine

## 2018-07-09 VITALS — BP 144/80 | HR 72 | Temp 98.1°F | Resp 20 | Ht 63.0 in | Wt 146.3 lb

## 2018-07-09 DIAGNOSIS — C7931 Secondary malignant neoplasm of brain: Secondary | ICD-10-CM

## 2018-07-09 DIAGNOSIS — N400 Enlarged prostate without lower urinary tract symptoms: Secondary | ICD-10-CM

## 2018-07-09 DIAGNOSIS — I714 Abdominal aortic aneurysm, without rupture: Secondary | ICD-10-CM

## 2018-07-09 DIAGNOSIS — Z923 Personal history of irradiation: Secondary | ICD-10-CM | POA: Diagnosis not present

## 2018-07-09 DIAGNOSIS — E785 Hyperlipidemia, unspecified: Secondary | ICD-10-CM

## 2018-07-09 DIAGNOSIS — R319 Hematuria, unspecified: Secondary | ICD-10-CM

## 2018-07-09 DIAGNOSIS — C3412 Malignant neoplasm of upper lobe, left bronchus or lung: Secondary | ICD-10-CM

## 2018-07-09 DIAGNOSIS — I10 Essential (primary) hypertension: Secondary | ICD-10-CM | POA: Diagnosis not present

## 2018-07-09 DIAGNOSIS — I252 Old myocardial infarction: Secondary | ICD-10-CM | POA: Diagnosis not present

## 2018-07-09 DIAGNOSIS — Z79899 Other long term (current) drug therapy: Secondary | ICD-10-CM | POA: Diagnosis not present

## 2018-07-09 DIAGNOSIS — I2510 Atherosclerotic heart disease of native coronary artery without angina pectoris: Secondary | ICD-10-CM | POA: Diagnosis not present

## 2018-07-09 DIAGNOSIS — Z7982 Long term (current) use of aspirin: Secondary | ICD-10-CM | POA: Diagnosis not present

## 2018-07-09 DIAGNOSIS — G40909 Epilepsy, unspecified, not intractable, without status epilepticus: Secondary | ICD-10-CM

## 2018-07-09 DIAGNOSIS — I739 Peripheral vascular disease, unspecified: Secondary | ICD-10-CM | POA: Diagnosis not present

## 2018-07-09 DIAGNOSIS — F17210 Nicotine dependence, cigarettes, uncomplicated: Secondary | ICD-10-CM

## 2018-07-09 DIAGNOSIS — J439 Emphysema, unspecified: Secondary | ICD-10-CM

## 2018-07-09 DIAGNOSIS — C3490 Malignant neoplasm of unspecified part of unspecified bronchus or lung: Secondary | ICD-10-CM

## 2018-07-09 NOTE — Progress Notes (Signed)
[facility]  [phone] [phone] OFFICE PROGRESS NOTE  Tammi Sou, MD [address] [place] [address]  DIAGNOSIS: Metastatic non-small cell lung cancer initially diagnosed as Stage IIIA (T2a., N2, M0) non-small cell lung cancer adenocarcinoma with negative EGFR mutation and negative ALK gene translocation involving the left upper lobe middle mediastinal lymphadenopathy diagnosed in August of 2014. He had metastatic disease to the brain diagnosed in December 2016.  Lung cancer   Primary site: Lung (Left)   Staging method: AJCC 7th Edition   Clinical: Stage IIIA (T2a, N2, M0) signed by [name], MD on 06/19/2013  5:06 PM   Summary: Stage IIIA (T2a, N2, M0).  Molecular biomarkers: Positive for [id number]*7, [id number] ATM splice site [id number]>TZ TP53 E271K, SETD2 [id number] and SPOP E47K                                          Negative for: RET, ALK, BRAF, KRAS, ERBB2, MET and EGFR.  PRIOR THERAPY:  1) Status post left upper lobectomy with mediastinal lymph node dissection on 05/29/2013. 2) Adjuvant chemotherapy with cisplatin at 75 mg per meter squared and Alimta at 500 mg per meter squared given every 3 weeks for a total of 4 cycles. First cycle given on 07/10/2013 3) curative radiotherapy to the mediastinum under the care of Dr. [name] on 12/12/2013. 4) status post stereotactic radiotherapy to the metastatic brain lesion on 08/25/2015 under the care of Dr. [name] CURRENT THERAPY: Observation.  INTERVAL HISTORY: Jordan [age] male returns to the clinic today for six-month follow-up visit.  The patient is feeling fine today with no concerning complaints except for intermittent pain on the left side of the chest.  He denied having any shortness of breath, cough or hemoptysis.  He denied having any fever or chills.  He has no nausea, vomiting, diarrhea or constipation.  He denied having any headache or  visual changes.  The patient has a repeat MRI of the brain as well as CT scan of the chest, abdomen and pelvis performed recently and he is here for evaluation and discussion of his scan results.  MEDICAL HISTORY: Past Medical History:  Diagnosis Date  . AAA (abdominal aortic aneurysm) (HCC)    s/p repair (aortoiliac bipass; with persistent endograft leak in inferior mesenteric artery)--stable as of 01/2017 vascular f/u.  . Marland Kitchensymptomatic cholelithiasis 07/2015   Incidental finding on PET CT  . Back pain 04/19/2016  . BPH with elevated PSA    PSA signif rise 11/2017 (5.4 in 2015 to 31.21 November 2017.  Urol (Dr. WiLovena Neighboursto do MRI prostate, then potentially prostate bx.  . Coronary artery disease    MI 1992, S/P  PTCA; negative stress test in November 2011 with no ischemia.   . Diverticulosis   . Elevated PSA 01/2018   01/22/18:  PSA 26.5.  Prostate MRI with abnormality->subsequent prostate bx BENIGN 04/12/18.  Rpt PSA 03/07/18 stable at 24.1-->f/u 1 yr with urologist.  . GrJohney Maineematuria 05/2018   Occurred 2+ mo's after prostate bx: attributed to bx by urol, treated empirically with keflex.  Urol suspects this is bleeding was sequela of his recent prostate bx.  . Hearing loss    Bilateral   . History of radiation therapy 11/10/13-12/12/13   lung,50Gy/2526f. Hyperlipidemia    Crestor= myalgias  . Hypertension   .  Lung cancer ([facility]) 05/2013   non-small cell;  L upper lobectomy with mediastinal LN dissection, chemo, and radiation in 2014/2015. Remission until pt had questionable seizure 07/2015--MRI showed brain mets; palliative brain rad (stereotactic radiation therapy) started 08/25/15.  CT C/A/P clear 02/2016, 05/2016, and 11/2016.  Plan per onc is to repeat in 6 mo.  . Malignant neoplasm of upper lobe, left bronchus or lung ([place]) 10/29/2013   ?New spiculated 4.5cm mass in L upper lung field on CXR at [facility] in [place] 07/14/17.  Dr. Julien Nordmann (onc) recommended f/u CXR after abx course.   If mass unchanged then repeat CT chest.  . Malignant neoplasm of upper lobe, left bronchus or lung ([place]) 06/26/2013  . Myocardial infarction [facility]) 1992   Dr Angelena Form    . Peripheral vascular disease ([facility]) 08/2012   4.8x4.6 cm infrarenal abdominal aortic fusiform aneurysm, 1.5 cm right common iliac artery aneurysm.  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  . S/P radiation therapy [facility] 08/25/15   Stereotactic radiation therapy: frontoparietal 18gy,posterior frontal lobe 20gy,  . Seizure disorder ([place]) 2016/2017   as sequela of brain mets;  Grand mal seizure 07/2015, then got on keppra and was seizure-free until focal motor seizures of L side of face began 02/2016- these responded well to up-titration of keppra but pt had adverse side effects so eventually pt had to be switched over to lamictal 07/2016--stable/seizure free on this med as of 01/2018 neuro f/u.  Marland Kitchen Shortness of breath   . Tobacco dependence    "Quit" 1992, but pt has smoked "on and off" since that time    ALLERGIES:  is allergic to fluvirin [influenza vac split quad]; rosuvastatin; and iodinated diagnostic agents.  MEDICATIONS:  Current Outpatient Medications  Medication Sig Dispense Refill  . amLODipine (NORVASC) 5 MG tablet TAKE 1 TABLET BY MOUTH EVERY DAY 90 tablet 1  . aspirin EC 81 MG tablet Take 81 mg by mouth at bedtime.     Marland Kitchen atorvastatin (LIPITOR) 20 MG tablet Take 1 tablet (20 mg total) by mouth daily. 90 tablet 1  . budesonide-formoterol (SYMBICORT) 160-4.5 MCG/ACT inhaler INHALE 1-2 PUFFS INTO THE LINGS EVERY 12 HOURS. GARGLE AND SPIT AFTER USE. 10.2 Inhaler 3  . diphenhydrAMINE (BENADRYL) 50 MG tablet Take 1 tablet (50 mg total) by mouth as directed. Take 1 tablet (50 mg) by mouth 1 hr prior to CTA (scheduled for 11/08/2017) 1 tablet 0  . lamoTRIgine (LAMICTAL) 200 MG tablet TAKE 1 TABLET BY MOUTH IN THE MORNING AND 1.5  TABLETS IN THE EVENING 72 tablet 5  . metoprolol tartrate (LOPRESSOR) 50 MG tablet TAKE 1 TABLET BY MOUTH TWICE A DAY 180 tablet 1  . Multiple Vitamin (MULTIVITAMIN) tablet Take 1 tablet by mouth daily.     . pentoxifylline (TRENTAL) 400 MG CR tablet Take 1 tablet (400 mg total) by mouth 3 (three) times daily. 90 tablet 5  . predniSONE (DELTASONE) 50 MG tablet Take 1 tablet (50 mg total) by mouth as directed. Take Prednisone 50 mg by mouth 13 hrs, 7 hrs and 1 hr prior to CTA (scheduled 11/08/2017). 3 tablet 0   No current facility-administered medications for this visit.     SURGICAL HISTORY:  Past Surgical History:  Procedure Laterality Date  . ABDOMINAL AORTIC ENDOVASCULAR STENT GRAFT N/A 05/07/2014   Procedure: ABDOMINAL AORTIC ENDOVASCULAR STENT GRAFT;  Surgeon: Serafina Mitchell, MD;  Location: [facility] OR;  Service: Vascular;  Laterality: N/A;  . Carotid duplex dopplers  07/2015   1-39% on R, no signif dz noted on L  . COLONOSCOPY W/ POLYPECTOMY  2003    negative 2010,due 2020; Dr Olevia Perches  . CORONARY ANGIOPLASTY     no stents  . CYSTOSCOPY/RETROGRADE/URETEROSCOPY Bilateral 10/09/2012   Procedure: BILATERAL RETROGRADE bladder and urethral BIOPSY ;  Surgeon: Molli Hazard, MD;  Location: [facility] ORS;  Service: Urology;  Laterality: Bilateral;  BILATERAL RETROGRADE   . EEG  08/05/15   Pt placed on keppra just prior to this test due to having ? seizure (MRI showed brain mets)  . HERNIA REPAIR Bilateral    Inguinal  . INGUINAL HERIIORRHAPHY BILATERALLY    . IR ANGIOGRAM SELECTIVE EACH ADDITIONAL VESSEL  12/03/2017  . IR ANGIOGRAM VISCERAL SELECTIVE  12/03/2017  . IR ANGIOGRAM VISCERAL SELECTIVE  12/03/2017  . IR EMBO ARTERIAL NOT HEMORR HEMANG INC GUIDE ROADMAPPING  12/03/2017  . IR GENERIC HISTORICAL  07/20/2016   IR RADIOLOGIST EVAL & MGMT 07/20/2016 [facility] INTERV RAD  . IR GENERIC HISTORICAL  08/02/2016   IR RADIOLOGIST EVAL & MGMT 08/02/2016 Sandi Mariscal, MD [facility] INTERV RAD  . IR  RADIOLOGIST EVAL & MGMT  11/08/2017  . IR RADIOLOGIST EVAL & MGMT  01/01/2018  . IR US GUIDE VASC ACCESS RIGHT  12/03/2017  . MEDIASTINOSCOPY N/A 05/01/2013   Procedure: MEDIASTINOSCOPY;  Surgeon: Gaye Pollack, MD;  Location: [facility] OR;  Service: Thoracic;  Laterality: N/A;  . PFTs  04/2013   Minimal obstructive airway disease  . PILONIDAL CYST EXCISION    . PROSTATE BIOPSY N/A 10/09/2012   NEG bx 04/12/18 as well.  Procedure: PROSTATIC URETHRAL BIOPSY--BPH--no evidence of malignancy;  Surgeon: Molli Hazard, MD;  Location: [facility];  Service: Urology;  Laterality: N/A;  PROSTATIC URETHRAL BIOPSY  . PROSTATE BIOPSY  03/2018   NEG  . PTCA  1992  . ROTATOR CUFF REPAIR     Bilateral  . THOROCOTOMY WITH LOBECTOMY Left 05/29/2013   Procedure: LEFT THOROCOTOMY WITH LEFT UPPER LOBE LOBECTOMY;  Surgeon: Gaye Pollack, MD;  Location: [facility];  Service: Thoracic;  Laterality: Left;  Marland Kitchen VIDEO BRONCHOSCOPY N/A 05/01/2013   Procedure: VIDEO BRONCHOSCOPY;  Surgeon: Gaye Pollack, MD;  Location: [facility] OR;  Service: Thoracic;  Laterality: N/A;    REVIEW OF SYSTEMS:  A comprehensive review of systems was negative except for: Respiratory: positive for pleurisy/chest pain   PHYSICAL EXAMINATION: General appearance: alert, cooperative and no distress Head: Normocephalic, without obvious abnormality, atraumatic Neck: no adenopathy, no JVD, supple, symmetrical, trachea midline and thyroid not enlarged, symmetric, no tenderness/mass/nodules Lymph nodes: Cervical, supraclavicular, and axillary nodes normal. Resp: clear to auscultation bilaterally Back: symmetric, no curvature. ROM normal. No CVA tenderness. Cardio: regular rate and rhythm, S1, S2 normal, no murmur, click, rub or gallop GI: soft, non-tender; bowel sounds normal; no masses,  no organomegaly Extremities: extremities normal, atraumatic, no cyanosis or edema  ECOG PERFORMANCE STATUS: 1 - Symptomatic but completely ambulatory  Blood pressure (!) 144/80,  pulse 72, temperature 98.1 F (36.7 C), temperature source Oral, resp. rate 20, height 5' 3"  (1.6 m), weight 146 lb 4.8 oz (66.4 kg), SpO2 100 %.  LABORATORY DATA: Lab Results  Component Value Date   WBC 8.3 07/02/2018   HGB 15.0 07/02/2018   HCT 44.6 07/02/2018   MCV 86.8 07/02/2018   PLT 245 07/02/2018      Chemistry  Component Value Date/Time   NA 141 07/02/2018 1106   NA 141 06/18/2017 1006   K 5.0 07/02/2018 1106   K 5.3 (H) 06/18/2017 1006   CL 107 07/02/2018 1106   CO2 25 07/02/2018 1106   CO2 25 06/18/2017 1006   BUN 14 07/02/2018 1106   BUN 14.3 06/18/2017 1006   CREATININE 1.05 07/02/2018 1106   CREATININE 0.9 06/18/2017 1006      Component Value Date/Time   CALCIUM 10.0 07/02/2018 1106   CALCIUM 10.3 06/18/2017 1006   ALKPHOS 66 07/02/2018 1106   ALKPHOS 63 06/18/2017 1006   AST 16 07/02/2018 1106   AST 15 06/18/2017 1006   ALT 20 07/02/2018 1106   ALT 17 06/18/2017 1006   BILITOT 0.3 07/02/2018 1106   BILITOT 0.35 06/18/2017 1006       RADIOGRAPHIC STUDIES: Ct Chest W Contrast  Result Date: 07/02/2018 CLINICAL DATA:  Remote history of lung cancer. EXAM: CT CHEST WITH CONTRAST TECHNIQUE: Multidetector CT imaging of the chest was performed during intravenous contrast administration. CONTRAST:  42m OMNIPAQUE IOHEXOL 300 MG/ML  SOLN COMPARISON:  Chest CTs from 4/25 and 06/18/2017. FINDINGS: Cardiovascular: The heart is within normal limits in size and stable. No pericardial effusion. Small amount of residual fluid in the pericardial recesses. Stable mild tortuosity and scattered calcification of the thoracic aorta. No dissection. The branch vessels are patent. Stable coronary artery calcifications. Mediastinum/Nodes: No mediastinal or hilar mass or lymphadenopathy. The esophagus is grossly normal. Lungs/Pleura: Stable surgical changes from a left upper lobe lobectomy. There also extensive radiation changes involving the left hilum and paramediastinal lung.  I do not see any CT findings worrisome for recurrent tumor. Stable emphysematous changes and pulmonary scarring. No new pulmonary lesions or metastatic pulmonary nodules. No acute pulmonary findings. No pleural effusion. Upper Abdomen: No significant upper abdominal findings. No findings suspicious for hepatic or adrenal gland metastasis. Stable left renal cysts. Stable atherosclerotic calcifications involving the upper abdominal aorta along with a aortic stent graft. Musculoskeletal: No chest wall mass, supraclavicular or axillary adenopathy. The thyroid gland appears normal. The bony thorax is intact. IMPRESSION: 1. Stable surgical changes and radiation changes involving the left hemithorax. No CT findings to suggest recurrent tumor. 2. No mediastinal or hilar adenopathy and no new pulmonary lesions or pulmonary nodules. 3. No findings to suggest upper abdominal metastatic disease. Aortic Atherosclerosis (ICD10-I70.0) and Emphysema (ICD10-J43.9). Electronically Signed   By: PMarijo SanesM.D.   On: 07/02/2018 15:54   Mr BJeri CosWOMContrast  Result Date: 07/04/2018 CLINICAL DATA:  Metastatic lung cancer. Follow-up treatment with [facility]. [facility] [address] Results: Creatinine 1.1 mg/dL. EXAM: MRI HEAD WITHOUT AND WITH CONTRAST TECHNIQUE: Multiplanar, multiecho pulse sequences of the brain and surrounding structures were obtained without and with intravenous contrast. CONTRAST:  174mMULTIHANCE GADOBENATE DIMEGLUMINE 529 MG/ML IV SOLN COMPARISON:  MRI head 12/20/2017, 08/23/2017 FINDINGS: Brain: Treated lesion right lateral parietal lobe is stable. Chronic hemorrhagic lesion with rim enhancement measures 12 x 19 mm unchanged. Surrounding white matter FLAIR hyperintensity stable. Treated lesion high right parietal lobe also unchanged. Chronic hemorrhagic lesion measuring 12 x 6 mm with surrounding FLAIR hyperintensity in the white matter No new enhancing  lesions are identified. Mild atrophy. Mild chronic white matter changes stable. Vascular: Normal arterial flow void Skull and upper cervical spine: Negative Sinuses/Orbits: Negative Other: None IMPRESSION: Stable MRI. No recurrent or new metastatic deposit. 2 treated lesions right parietal  lobe stable. Electronically Signed   By: Franchot Gallo M.D.   On: 07/04/2018 14:16   ASSESSMENT/PLAN:  This is a very pleasant 77 years old white male with metastatic non-small cell lung cancer that was initially diagnosed as a stage IIIa adenocarcinoma status post left upper lobectomy with mediastinal lymph node dissection followed by adjuvant systemic chemotherapy with cisplatin and Alimta. The patient also received stereotactic radiotherapy to metastatic brain lesions in January 2017. The patient is currently on observation and he is feeling fine. Repeat CT scan of the chest, abdomen and pelvis showed no concerning findings for disease progression.  MRI of the brain also showed no concerning findings for new brain metastasis. I discussed the scan results with the patient and recommended for him to continue in observation with repeat CT scan of the chest, abdomen and pelvis in 6 months. The patient was advised to call immediately if he has any concerning symptoms in the interval. All questions were answered. The patient knows to call the clinic with any problems, questions or concerns. We can certainly see the patient much sooner if necessary. I spent 10 minutes counseling the patient face to face. The total time spent in the appointment was 15 minutes.  Disclaimer: This note was dictated with voice recognition software. Similar sounding words can inadvertently be transcribed and may not be corrected upon review.

## 2018-07-11 ENCOUNTER — Telehealth: Payer: Self-pay | Admitting: Internal Medicine

## 2018-07-11 NOTE — Telephone Encounter (Signed)
Scheduled appt per 11/19 los - sent reminder letter in the mail with appt date and time  Lab and f/u in 6 months.

## 2018-07-20 ENCOUNTER — Encounter: Payer: Self-pay | Admitting: Family Medicine

## 2018-08-07 ENCOUNTER — Ambulatory Visit: Payer: Medicare Other | Admitting: Podiatry

## 2018-08-20 DIAGNOSIS — H2512 Age-related nuclear cataract, left eye: Secondary | ICD-10-CM | POA: Diagnosis not present

## 2018-08-20 DIAGNOSIS — H2513 Age-related nuclear cataract, bilateral: Secondary | ICD-10-CM | POA: Diagnosis not present

## 2018-08-20 DIAGNOSIS — H25043 Posterior subcapsular polar age-related cataract, bilateral: Secondary | ICD-10-CM | POA: Diagnosis not present

## 2018-08-20 DIAGNOSIS — H25013 Cortical age-related cataract, bilateral: Secondary | ICD-10-CM | POA: Diagnosis not present

## 2018-08-20 DIAGNOSIS — H53031 Strabismic amblyopia, right eye: Secondary | ICD-10-CM | POA: Diagnosis not present

## 2018-08-22 ENCOUNTER — Encounter: Payer: Self-pay | Admitting: Family Medicine

## 2018-08-23 ENCOUNTER — Ambulatory Visit: Payer: Self-pay

## 2018-08-23 DIAGNOSIS — J209 Acute bronchitis, unspecified: Secondary | ICD-10-CM | POA: Diagnosis not present

## 2018-08-23 NOTE — Telephone Encounter (Signed)
Returned call to patient who states that he has had a bad cough and congestion since After Christmas.. He has taken Delsym without relief. He has a fever. Today 100.5. his sputum is yellow.  He has ear pressure. His ribs hurt when he coughs. Pt will go to urgent care for symptoms. Refused other office. He requested a follow up appointment for Monday with Dr Anitra Lauth which was scheduled per his request. Care advice read to patient. Pt again will go to urgent care near his home in [place]. Pt verbalized understanding of all instructions.  Reason for Disposition . [1] Continuous (nonstop) coughing interferes with work or school AND [2] no improvement using cough treatment per Care Advice  Answer Assessment - Initial Assessment Questions 1. ONSET: "When did the cough begin?"      After christmas 2. SEVERITY: "How bad is the cough today?"      Cant stop 3. RESPIRATORY DISTRESS: "Describe your breathing."      SOB 4. FEVER: "Do you have a fever?" If so, ask: "What is your temperature, how was it measured, and when did it start?"     100.5 5. SPUTUM: "Describe the color of your sputum" (clear, white, yellow, green)     yellow 6. HEMOPTYSIS: "Are you coughing up any blood?" If so ask: "How much?" (flecks, streaks, tablespoons, etc.)     no 7. CARDIAC HISTORY: "Do you have any history of heart disease?" (e.g., heart attack, congestive heart failure)      Heart attack 8. LUNG HISTORY: "Do you have any history of lung disease?"  (e.g., pulmonary embolus, asthma, emphysema)     no 9. PE RISK FACTORS: "Do you have a history of blood clots?" (or: recent major surgery, recent prolonged travel, bedridden)    no 10. OTHER SYMPTOMS: "Do you have any other symptoms?" (e.g., runny nose, wheezing, chest pain)       Stuffy nose,  Ears stuffy, rib hurt cough 11. PREGNANCY: "Is there any chance you are pregnant?" "When was your last menstrual period?"       N/A 12. TRAVEL: "Have you traveled out of the country  in the last month?" (e.g., travel history, exposures)       No  Protocols used: [place]

## 2018-08-23 NOTE — Telephone Encounter (Signed)
Noted  

## 2018-08-26 ENCOUNTER — Encounter: Payer: Self-pay | Admitting: Family Medicine

## 2018-08-26 ENCOUNTER — Ambulatory Visit (INDEPENDENT_AMBULATORY_CARE_PROVIDER_SITE_OTHER): Payer: Medicare Other | Admitting: Family Medicine

## 2018-08-26 VITALS — BP 124/71 | HR 88 | Temp 98.3°F | Resp 16 | Ht 63.0 in | Wt 145.4 lb

## 2018-08-26 DIAGNOSIS — J180 Bronchopneumonia, unspecified organism: Secondary | ICD-10-CM | POA: Diagnosis not present

## 2018-08-26 MED ORDER — PREDNISONE 20 MG PO TABS: ORAL_TABLET | ORAL | 0 refills | Status: DC

## 2018-08-26 MED ORDER — IPRATROPIUM-ALBUTEROL 0.5-2.5 (3) MG/3ML IN SOLN
3.0000 mL | Freq: Once | RESPIRATORY_TRACT | Status: AC
Start: 2018-08-26 — End: 2018-08-26
  Administered 2018-08-26: 3 mL via RESPIRATORY_TRACT

## 2018-08-26 NOTE — Patient Instructions (Signed)
Get otc generic robitussin DM OR Mucinex DM and use as directed on the packaging for cough and congestion. Use otc generic saline nasal spray 2-3 times per day to irrigate/moisturize your nasal passages.   

## 2018-08-26 NOTE — Progress Notes (Signed)
OFFICE VISIT  08/26/2018   CC:  Chief Complaint  Patient presents with  . Cough   HPI:    Patient is a 78 y.o. Caucasian male with a remote history of lung cancer, hx of long term "on again, off again" tobacco abuse, and hx of minimal obstructive airways on PFTs in 2014-->who presents for respiratory illness.  Pt started the day after christmas with nasal congestion, cough, runny nose, ears felt plugged, +malaise, hurts in rib cage when he coughs but this is better now.  Question of wheeze vs pseudowheeze.  No chest tightness or SOB. No fevers.  No body aches.  No n/v/d or rash or HA.  No CP, dizziness, palpitations, or LE swelling. Trouble sleeping supine at night b/c of all the coughing.  He gets in a recliner and he's better. Has ST from coughing. He eats fine, drinks 3 bottles of water per day.   Wife has same illness.  He went to [facility] 08/23/2018 and was rx'd 6d steroid taper from 60 qd down to 10 qd.  Also, Z pack and Proair.  He feels "like crap" and thinks he feels about the same except his nose is a little clearer. He is unsure if proair is helping or not.  Past Medical History:  Diagnosis Date  . AAA (abdominal aortic aneurysm) (HCC)    s/p repair (aortoiliac bipass; with persistent endograft leak in inferior mesenteric artery)--stable as of 01/2017 vascular f/u.  Marland Kitchen Asymptomatic cholelithiasis 07/2015   Incidental finding on PET CT  . Back pain 04/19/2016  . BPH with elevated PSA    PSA signif rise 11/2017 (5.4 in 2015 to 31.21 November 2017.  Urol (Dr. Lovena Neighbours) to do MRI prostate, then potentially prostate bx.  . Coronary artery disease    MI 1992, S/P  PTCA; negative stress test in November 2011 with no ischemia.   . Diverticulosis   . Elevated PSA 01/2018   01/22/18:  PSA 26.5.  Prostate MRI with abnormality->subsequent prostate bx BENIGN 04/12/18.  Rpt PSA 03/07/18 stable at 24.1-->f/u 1 yr with urologist.  . [name] hematuria 05/2018   Occurred 2+ mo's after  prostate bx: attributed to bx by urol, treated empirically with keflex.  Urol suspects this is bleeding was sequela of his recent prostate bx.  . Hearing loss    Bilateral   . History of radiation therapy 11/10/13-12/12/13   lung,50Gy/73fx  . Hyperlipidemia    Crestor= myalgias  . Hypertension   . Lung cancer ([place]) 05/2013   non-small cell;  L upper lobectomy with mediastinal LN dissection, chemo, and radiation in 2014/2015. Remission until pt had questionable seizure 07/2015--MRI showed brain mets; palliative brain rad (stereotactic radiation therapy) started 08/25/15.  CT C/A/P clear 02/2016, 05/2016, and 11/2016.  Plan per onc is to repeat in 6 mo.  . Malignant neoplasm of upper lobe, left bronchus or lung ([place]) 10/29/2013   ?New spiculated 4.5cm mass in L upper lung field on CXR at [facility] in [place] 07/14/17.  Dr. Julien Nordmann (onc) recommended f/u CXR after abx course.  If mass unchanged then repeat CT chest.  Surveillance CT chest and MRI brain 06/2018 show no sign of dz.  . Malignant neoplasm of upper lobe, left bronchus or lung ([place]) 06/26/2013  . Myocardial infarction [facility]) 1992   Dr Angelena Form    . Peripheral vascular disease ([place]) 08/2012   4.8x4.6 cm infrarenal abdominal aortic fusiform aneurysm, 1.5 cm right common iliac artery aneurysm.  Type II  endoleak from inferior mesenteric artery--Vasc surg referred pt to interv rad for possible embolization of the leak as of 07/17/16.  . S/P radiation therapy [facility] 08/25/15   Stereotactic radiation therapy: frontoparietal 18gy,posterior frontal lobe 20gy,  . Seizure disorder ([place]) 2016/2017   as sequela of brain mets;  Grand mal seizure 07/2015, then got on keppra and was seizure-free until focal motor seizures of L side of face began 02/2016- these responded well to up-titration of keppra but pt had adverse side effects so eventually pt had to be switched over to lamictal 07/2016--stable/seizure free on this med as of 01/2018 neuro f/u.  Marland Kitchen  Shortness of breath   . Tobacco dependence    "Quit" 1992, but pt has smoked "on and off" since that time    Past Surgical History:  Procedure Laterality Date  . ABDOMINAL AORTIC ENDOVASCULAR STENT GRAFT N/A 05/07/2014   Procedure: ABDOMINAL AORTIC ENDOVASCULAR STENT GRAFT;  Surgeon: Serafina Mitchell, MD;  Location: [place] OR;  Service: Vascular;  Laterality: N/A;  . Carotid duplex dopplers  07/2015   1-39% on R, no signif dz noted on L  . COLONOSCOPY W/ POLYPECTOMY  2003    negative 2010,due 2020; Dr Olevia Perches  . CORONARY ANGIOPLASTY     no stents  . CYSTOSCOPY/RETROGRADE/URETEROSCOPY Bilateral 10/09/2012   Procedure: BILATERAL RETROGRADE bladder and urethral BIOPSY ;  Surgeon: Molli Hazard, MD;  [facility];  Service: Urology;  Laterality: Bilateral;  BILATERAL RETROGRADE   . EEG  08/05/15   Pt placed on keppra just prior to this test due to having ? seizure (MRI showed brain mets)  . HERNIA REPAIR Bilateral    Inguinal  . INGUINAL HERIIORRHAPHY BILATERALLY    . IR ANGIOGRAM SELECTIVE EACH ADDITIONAL VESSEL  12/03/2017  . IR ANGIOGRAM VISCERAL SELECTIVE  12/03/2017  . IR ANGIOGRAM VISCERAL SELECTIVE  12/03/2017  . IR EMBO ARTERIAL NOT HEMORR HEMANG INC GUIDE ROADMAPPING  12/03/2017  . IR GENERIC HISTORICAL  07/20/2016   IR RADIOLOGIST EVAL & MGMT 07/20/2016 [facility] INTERV RAD  . IR GENERIC HISTORICAL  08/02/2016   IR RADIOLOGIST EVAL & MGMT 08/02/2016 Sandi Mariscal, MD [facility] INTERV RAD  . IR RADIOLOGIST EVAL & MGMT  11/08/2017  . IR RADIOLOGIST EVAL & MGMT  01/01/2018  . IR US GUIDE VASC ACCESS RIGHT  12/03/2017  . MEDIASTINOSCOPY N/A 05/01/2013   Procedure: MEDIASTINOSCOPY;  Surgeon: Gaye Pollack, MD;  Location: [facility] OR;  Service: Thoracic;  Laterality: N/A;  . PFTs  04/2013   Minimal obstructive airway disease  . PILONIDAL CYST EXCISION    . PROSTATE BIOPSY N/A 10/09/2012   NEG bx 04/12/18 as well.  Procedure: PROSTATIC URETHRAL BIOPSY--BPH--no evidence of malignancy;  Surgeon:  Molli Hazard, MD;  [facility];  Service: Urology;  Laterality: N/A;  PROSTATIC URETHRAL BIOPSY  . PROSTATE BIOPSY  03/2018   NEG  . PTCA  1992  . ROTATOR CUFF REPAIR     Bilateral  . THOROCOTOMY WITH LOBECTOMY Left 05/29/2013   Procedure: LEFT THOROCOTOMY WITH LEFT UPPER LOBE LOBECTOMY;  Surgeon: Gaye Pollack, MD;  Location: [place];  Service: Thoracic;  Laterality: Left;  Marland Kitchen VIDEO BRONCHOSCOPY N/A 05/01/2013   Procedure: VIDEO BRONCHOSCOPY;  Surgeon: Gaye Pollack, MD;  Location: [facility] OR;  Service: Thoracic;  Laterality: N/A;    Outpatient Medications Prior to Visit  Medication Sig Dispense Refill  . albuterol (PROVENTIL HFA;VENTOLIN HFA) 108 (90 Base) MCG/ACT inhaler Inhale 2 puffs into the lungs every  6 (six) hours as needed for wheezing or shortness of breath.    Marland Kitchen amLODipine (NORVASC) 5 MG tablet TAKE 1 TABLET BY MOUTH EVERY DAY 90 tablet 1  . aspirin EC 81 MG tablet Take 81 mg by mouth at bedtime.     Marland Kitchen atorvastatin (LIPITOR) 20 MG tablet Take 1 tablet (20 mg total) by mouth daily. 90 tablet 1  . azithromycin (ZITHROMAX) 250 MG tablet Take 250 mg by mouth daily.    . budesonide-formoterol (SYMBICORT) 160-4.5 MCG/ACT inhaler INHALE 1-2 PUFFS INTO THE LINGS EVERY 12 HOURS. GARGLE AND SPIT AFTER USE. 10.2 Inhaler 3  . diphenhydrAMINE (BENADRYL) 50 MG tablet Take 1 tablet (50 mg total) by mouth as directed. Take 1 tablet (50 mg) by mouth 1 hr prior to CTA (scheduled for 11/08/2017) 1 tablet 0  . lamoTRIgine (LAMICTAL) 200 MG tablet TAKE 1 TABLET BY MOUTH IN THE MORNING AND 1.5 TABLETS IN THE EVENING 72 tablet 5  . metoprolol tartrate (LOPRESSOR) 50 MG tablet TAKE 1 TABLET BY MOUTH TWICE A DAY 180 tablet 1  . Multiple Vitamin (MULTIVITAMIN) tablet Take 1 tablet by mouth daily.     . pentoxifylline (TRENTAL) 400 MG CR tablet Take 1 tablet (400 mg total) by mouth 3 (three) times daily. 90 tablet 5  . predniSONE (DELTASONE) 10 MG tablet Take 10 mg by mouth. 6 tab x 1d, 5 tab x 1d, 4  tab x 1d, 3 tab x 1d, 2 tab x 1d, 1 tab x 1d    . predniSONE (DELTASONE) 50 MG tablet Take 1 tablet (50 mg total) by mouth as directed. Take Prednisone 50 mg by mouth 13 hrs, 7 hrs and 1 hr prior to CTA (scheduled 11/08/2017). (Patient not taking: Reported on 08/26/2018) 3 tablet 0   No facility-administered medications prior to visit.     Allergies  Allergen Reactions  . Fluvirin [Influenza Vac Split Quad] Nausea And Vomiting and Other (See Comments)    Seizure like activity?  Marland Kitchen Rosuvastatin Other (See Comments)    Myalgias and dark urine  . Iodinated Diagnostic Agents Hives    1 hive on lt cheek lasting approximately 1 hour on last 2 CT per pt; needs pre meds in future; 50 mg benadryl po 1 hr prior to exam per Dr. Weber Cooks 07/2014  11/2016 per Tery Sanfilippo, pt needs 13hr premeds per our policy.    ROS As per HPI  PE: Blood pressure 124/71, pulse 88, temperature 98.3 F (36.8 C), temperature source Oral, resp. rate 16, height 5\' 3"  (1.6 m), weight 145 lb 6 oz (65.9 kg), SpO2 95 %. Body mass index is 25.75 kg/m.  VS: noted--normal. Gen: alert, NAD, NONTOXIC APPEARING. HEENT: eyes without injection, drainage, or swelling.  Ears: EACs clear, TMs with normal light reflex and landmarks.  Nose: Clear rhinorrhea, with some dried, crusty exudate adherent to mildly injected mucosa.  No purulent d/c.  No paranasal sinus TTP.  No facial swelling.  Throat and mouth without focal lesion.  No pharyngial swelling, erythema, or exudate.   Neck: supple, no LAD.   LUNGS: Bilat insp/exp wheezing, with diminished aeration diffusely and mild prolongation of exp phase.  Nonlabored resps.   CV: RRR, no m/r/g. EXT: no c/c/e SKIN: no rash  LABS:    Chemistry      Component Value Date/Time   NA 141 07/02/2018 1106   NA 141 06/18/2017 1006   K 5.0 07/02/2018 1106   K 5.3 (H) 06/18/2017 1006   CL 107 07/02/2018  1106   CO2 25 07/02/2018 1106   CO2 25 06/18/2017 1006   BUN 14 07/02/2018 1106   BUN 14.3  06/18/2017 1006   CREATININE 1.05 07/02/2018 1106   CREATININE 0.9 06/18/2017 1006      Component Value Date/Time   CALCIUM 10.0 07/02/2018 1106   CALCIUM 10.3 06/18/2017 1006   ALKPHOS 66 07/02/2018 1106   ALKPHOS 63 06/18/2017 1006   AST 16 07/02/2018 1106   AST 15 06/18/2017 1006   ALT 20 07/02/2018 1106   ALT 17 06/18/2017 1006   BILITOT 0.3 07/02/2018 1106   BILITOT 0.35 06/18/2017 1006      IMPRESSION AND PLAN:  Acute bronchopneumonia: Duoneb given in office today--> he felt improved breathing and he had better aeration after this. Will give stronger/longer steroid taper: 40mg  qd x 5d, then 20mg  qd x 5d. Continue/finish azithromycin. Inhaler education done by the nurse today to make sure pt is using his ProAir correctly. If not signif improved at f/u in 4d, will obtain CBC w/diff, BMET, and CXR.  (NO fluoroquinolones-->pt with AAA)  An After Visit Summary was printed and given to the patient.  FOLLOW UP: Return in about 4 days (around 08/30/2018).  Signed:  Crissie Sickles, MD           08/26/2018

## 2018-08-30 ENCOUNTER — Encounter: Payer: Self-pay | Admitting: Family Medicine

## 2018-08-30 ENCOUNTER — Ambulatory Visit (INDEPENDENT_AMBULATORY_CARE_PROVIDER_SITE_OTHER): Payer: Medicare Other | Admitting: Family Medicine

## 2018-08-30 VITALS — BP 127/72 | HR 78 | Temp 98.4°F | Resp 16 | Ht 63.0 in | Wt 146.1 lb

## 2018-08-30 DIAGNOSIS — J180 Bronchopneumonia, unspecified organism: Secondary | ICD-10-CM

## 2018-08-30 NOTE — Progress Notes (Signed)
OFFICE VISIT  08/30/2018   CC:  Chief Complaint  Patient presents with  . Follow-up    bronchopneumonia   HPI:    Patient is a 78 y.o. Caucasian male who presents for 4 day f/u acute bronchopneumonia. Plan as per last visit:  "Duoneb given in office--> he felt improved breathing and he had better aeration after this. Will give stronger/longer steroid taper: 40mg  qd x 5d, then 20mg  qd x 5d. Continue/finish azithromycin. Inhaler education done by the nurse today to make sure pt is using his ProAir correctly. If not signif improved at f/u in 4d, will obtain CBC w/diff, BMET, and CXR".  Interim hx: Feeling a lot better: approx 60% better. Still with some cough/phlegm.  He is taking all meds correctly.  Finished zpack. Taking mucinex DM q12h.   Eating and drinking normally.  Sleeping much better since cough better controlled.  Rare wheeze.  No SOB.  No fever. No n/v/d or rash.  Past Medical History:  Diagnosis Date  . AAA (abdominal aortic aneurysm) (HCC)    s/p repair (aortoiliac bipass; with persistent endograft leak in inferior mesenteric artery)--stable as of 01/2017 vascular f/u.  Marland Kitchen Asymptomatic cholelithiasis 07/2015   Incidental finding on PET CT  . Back pain 04/19/2016  . BPH with elevated PSA    PSA signif rise 11/2017 (5.4 in 2015 to 31.21 November 2017.  Urol (Dr. Lovena Neighbours) to do MRI prostate, then potentially prostate bx.  . Coronary artery disease    MI 1992, S/P  PTCA; negative stress test in November 2011 with no ischemia.   . Diverticulosis   . Elevated PSA 01/2018   01/22/18:  PSA 26.5.  Prostate MRI with abnormality->subsequent prostate bx BENIGN 04/12/18.  Rpt PSA 03/07/18 stable at 24.1-->f/u 1 yr with urologist.  . [name] hematuria 05/2018   Occurred 2+ mo's after prostate bx: attributed to bx by urol, treated empirically with keflex.  Urol suspects this is bleeding was sequela of his recent prostate bx.  . Hearing loss    Bilateral   . History of radiation therapy  11/10/13-12/12/13   lung,50Gy/14fx  . Hyperlipidemia    Crestor= myalgias  . Hypertension   . Lung cancer ([place]) 05/2013   non-small cell;  L upper lobectomy with mediastinal LN dissection, chemo, and radiation in 2014/2015. Remission until pt had questionable seizure 07/2015--MRI showed brain mets; palliative brain rad (stereotactic radiation therapy) started 08/25/15.  CT C/A/P clear 02/2016, 05/2016, and 11/2016.  Plan per onc is to repeat in 6 mo.  . Malignant neoplasm of upper lobe, left bronchus or lung ([place]) 10/29/2013   ?New spiculated 4.5cm mass in L upper lung field on CXR at [facility] in [place] 07/14/17.  Dr. Julien Nordmann (onc) recommended f/u CXR after abx course.  If mass unchanged then repeat CT chest.  Surveillance CT chest and MRI brain 06/2018 show no sign of dz.  . Malignant neoplasm of upper lobe, left bronchus or lung ([place]) 06/26/2013  . Myocardial infarction [facility]) 1992   Dr Angelena Form    . Peripheral vascular disease ([place]) 08/2012   4.8x4.6 cm infrarenal abdominal aortic fusiform aneurysm, 1.5 cm right common iliac artery aneurysm.  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  . S/P radiation therapy [facility] 08/25/15   Stereotactic radiation therapy: frontoparietal 18gy,posterior frontal lobe 20gy,  . Seizure disorder ([place]) 2016/2017   as sequela of brain mets;  [place]  mal seizure 07/2015, then got on keppra and was seizure-free until focal motor seizures of L side of face began 02/2016- these responded well to up-titration of keppra but pt had adverse side effects so eventually pt had to be switched over to lamictal 07/2016--stable/seizure free on this med as of 01/2018 neuro f/u.  Marland Kitchen Shortness of breath   . Tobacco dependence    "Quit" 1992, but pt has smoked "on and off" since that time    Past Surgical History:  Procedure Laterality Date  . ABDOMINAL AORTIC ENDOVASCULAR STENT GRAFT  N/A 05/07/2014   Procedure: ABDOMINAL AORTIC ENDOVASCULAR STENT GRAFT;  Surgeon: Serafina Mitchell, MD;  Location: [place] OR;  Service: Vascular;  Laterality: N/A;  . Carotid duplex dopplers  07/2015   1-39% on R, no signif dz noted on L  . COLONOSCOPY W/ POLYPECTOMY  2003    negative 2010,due 2020; Dr Olevia Perches  . CORONARY ANGIOPLASTY     no stents  . CYSTOSCOPY/RETROGRADE/URETEROSCOPY Bilateral 10/09/2012   Procedure: BILATERAL RETROGRADE bladder and urethral BIOPSY ;  Surgeon: Molli Hazard, MD;  [facility];  Service: Urology;  Laterality: Bilateral;  BILATERAL RETROGRADE   . EEG  08/05/15   Pt placed on keppra just prior to this test due to having ? seizure (MRI showed brain mets)  . HERNIA REPAIR Bilateral    Inguinal  . INGUINAL HERIIORRHAPHY BILATERALLY    . IR ANGIOGRAM SELECTIVE EACH ADDITIONAL VESSEL  12/03/2017  . IR ANGIOGRAM VISCERAL SELECTIVE  12/03/2017  . IR ANGIOGRAM VISCERAL SELECTIVE  12/03/2017  . IR EMBO ARTERIAL NOT HEMORR HEMANG INC GUIDE ROADMAPPING  12/03/2017  . IR GENERIC HISTORICAL  07/20/2016   IR RADIOLOGIST EVAL & MGMT 07/20/2016 [facility] INTERV RAD  . IR GENERIC HISTORICAL  08/02/2016   IR RADIOLOGIST EVAL & MGMT 08/02/2016 Sandi Mariscal, MD [facility] INTERV RAD  . IR RADIOLOGIST EVAL & MGMT  11/08/2017  . IR RADIOLOGIST EVAL & MGMT  01/01/2018  . IR US GUIDE VASC ACCESS RIGHT  12/03/2017  . MEDIASTINOSCOPY N/A 05/01/2013   Procedure: MEDIASTINOSCOPY;  Surgeon: Gaye Pollack, MD;  Location: [facility] OR;  Service: Thoracic;  Laterality: N/A;  . PFTs  04/2013   Minimal obstructive airway disease  . PILONIDAL CYST EXCISION    . PROSTATE BIOPSY N/A 10/09/2012   NEG bx 04/12/18 as well.  Procedure: PROSTATIC URETHRAL BIOPSY--BPH--no evidence of malignancy;  Surgeon: Molli Hazard, MD;  [facility];  Service: Urology;  Laterality: N/A;  PROSTATIC URETHRAL BIOPSY  . PROSTATE BIOPSY  03/2018   NEG  . PTCA  1992  . ROTATOR CUFF REPAIR     Bilateral  . THOROCOTOMY  WITH LOBECTOMY Left 05/29/2013   Procedure: LEFT THOROCOTOMY WITH LEFT UPPER LOBE LOBECTOMY;  Surgeon: Gaye Pollack, MD;  Location: [place];  Service: Thoracic;  Laterality: Left;  Marland Kitchen VIDEO BRONCHOSCOPY N/A 05/01/2013   Procedure: VIDEO BRONCHOSCOPY;  Surgeon: Gaye Pollack, MD;  Location: [facility] OR;  Service: Thoracic;  Laterality: N/A;    Outpatient Medications Prior to Visit  Medication Sig Dispense Refill  . albuterol (PROVENTIL HFA;VENTOLIN HFA) 108 (90 Base) MCG/ACT inhaler Inhale 2 puffs into the lungs every 6 (six) hours as needed for wheezing or shortness of breath.    Marland Kitchen amLODipine (NORVASC) 5 MG tablet TAKE 1 TABLET BY MOUTH EVERY DAY 90 tablet 1  . aspirin EC 81 MG tablet Take 81 mg by mouth at bedtime.     Marland Kitchen atorvastatin (LIPITOR) 20  MG tablet Take 1 tablet (20 mg total) by mouth daily. 90 tablet 1  . budesonide-formoterol (SYMBICORT) 160-4.5 MCG/ACT inhaler INHALE 1-2 PUFFS INTO THE LINGS EVERY 12 HOURS. GARGLE AND SPIT AFTER USE. 10.2 Inhaler 3  . diphenhydrAMINE (BENADRYL) 50 MG tablet Take 1 tablet (50 mg total) by mouth as directed. Take 1 tablet (50 mg) by mouth 1 hr prior to CTA (scheduled for 11/08/2017) 1 tablet 0  . lamoTRIgine (LAMICTAL) 200 MG tablet TAKE 1 TABLET BY MOUTH IN THE MORNING AND 1.5 TABLETS IN THE EVENING 72 tablet 5  . metoprolol tartrate (LOPRESSOR) 50 MG tablet TAKE 1 TABLET BY MOUTH TWICE A DAY 180 tablet 1  . Multiple Vitamin (MULTIVITAMIN) tablet Take 1 tablet by mouth daily.     . pentoxifylline (TRENTAL) 400 MG CR tablet Take 1 tablet (400 mg total) by mouth 3 (three) times daily. 90 tablet 5  . predniSONE (DELTASONE) 20 MG tablet 2 tabs po qd x 5d, then 1 tab po qd x 5d 15 tablet 0  . predniSONE (DELTASONE) 50 MG tablet Take 1 tablet (50 mg total) by mouth as directed. Take Prednisone 50 mg by mouth 13 hrs, 7 hrs and 1 hr prior to CTA (scheduled 11/08/2017). (Patient not taking: Reported on 08/30/2018) 3 tablet 0  . azithromycin (ZITHROMAX) 250 MG tablet Take  250 mg by mouth daily.     No facility-administered medications prior to visit.     Allergies  Allergen Reactions  . Fluvirin [Influenza Vac Split Quad] Nausea And Vomiting and Other (See Comments)    Seizure like activity?  Marland Kitchen Rosuvastatin Other (See Comments)    Myalgias and dark urine  . Iodinated Diagnostic Agents Hives    1 hive on lt cheek lasting approximately 1 hour on last 2 CT per pt; needs pre meds in future; 50 mg benadryl po 1 hr prior to exam per Dr. Weber Cooks 07/2014  11/2016 per Tery Sanfilippo, pt needs 13hr premeds per our policy.    ROS As per HPI  PE: Blood pressure 127/72, pulse 78, temperature 98.4 F (36.9 C), temperature source Oral, resp. rate 16, height 5\' 3"  (1.6 m), weight 146 lb 2 oz (66.3 kg), SpO2 99 %. Body mass index is 25.88 kg/m.  VS: noted--normal. Gen: alert, NAD, NONTOXIC APPEARING. HEENT: eyes without injection, drainage, or swelling.  Ears: EACs clear, TMs with normal light reflex and landmarks.  Nose: Clear rhinorrhea, with some dried, crusty exudate adherent to mildly injected mucosa.  No purulent d/c.  No paranasal sinus TTP.  No facial swelling.  Throat and mouth without focal lesion.  No pharyngial swelling, erythema, or exudate.   Neck: supple, no LAD.   LUNGS: CTA bilat, nonlabored resps.   CV: RRR, no m/r/g. EXT: no c/c/e SKIN: no rash   LABS:    Chemistry      Component Value Date/Time   NA 141 07/02/2018 1106   NA 141 06/18/2017 1006   K 5.0 07/02/2018 1106   K 5.3 (H) 06/18/2017 1006   CL 107 07/02/2018 1106   CO2 25 07/02/2018 1106   CO2 25 06/18/2017 1006   BUN 14 07/02/2018 1106   BUN 14.3 06/18/2017 1006   CREATININE 1.05 07/02/2018 1106   CREATININE 0.9 06/18/2017 1006      Component Value Date/Time   CALCIUM 10.0 07/02/2018 1106   CALCIUM 10.3 06/18/2017 1006   ALKPHOS 66 07/02/2018 1106   ALKPHOS 63 06/18/2017 1006   AST 16 07/02/2018 1106   AST  15 06/18/2017 1006   ALT 20 07/02/2018 1106   ALT 17 06/18/2017  1006   BILITOT 0.3 07/02/2018 1106   BILITOT 0.35 06/18/2017 1006     Lab Results  Component Value Date   WBC 8.3 07/02/2018   HGB 15.0 07/02/2018   HCT 44.6 07/02/2018   MCV 86.8 07/02/2018   PLT 245 07/02/2018   Pertinent imaging.  CT chest with contrast 07/02/18: CLINICAL DATA:  Remote history of lung cancer.  EXAM: CT CHEST WITH CONTRAST  TECHNIQUE: Multidetector CT imaging of the chest was performed during intravenous contrast administration.  CONTRAST:  77mL OMNIPAQUE IOHEXOL 300 MG/ML  SOLN  COMPARISON:  Chest CTs from 4/25 and 06/18/2017.  FINDINGS: Cardiovascular: The heart is within normal limits in size and stable. No pericardial effusion. Small amount of residual fluid in the pericardial recesses. Stable mild tortuosity and scattered calcification of the thoracic aorta. No dissection. The branch vessels are patent. Stable coronary artery calcifications.  Mediastinum/Nodes: No mediastinal or hilar mass or lymphadenopathy. The esophagus is grossly normal.  Lungs/Pleura: Stable surgical changes from a left upper lobe lobectomy. There also extensive radiation changes involving the left hilum and paramediastinal lung. I do not see any CT findings worrisome for recurrent tumor.  Stable emphysematous changes and pulmonary scarring. No new pulmonary lesions or metastatic pulmonary nodules. No acute pulmonary findings. No pleural effusion.  Upper Abdomen: No significant upper abdominal findings. No findings suspicious for hepatic or adrenal gland metastasis. Stable left renal cysts. Stable atherosclerotic calcifications involving the upper abdominal aorta along with a aortic stent graft.  Musculoskeletal: No chest wall mass, supraclavicular or axillary adenopathy. The thyroid gland appears normal. The bony thorax is intact.  IMPRESSION: 1. Stable surgical changes and radiation changes involving the left hemithorax. No CT findings to suggest  recurrent tumor. 2. No mediastinal or hilar adenopathy and no new pulmonary lesions or pulmonary nodules. 3. No findings to suggest upper abdominal metastatic disease.  Aortic Atherosclerosis (ICD10-I70.0) and Emphysema (ICD10-J43.9).  IMPRESSION AND PLAN:  Acute bronchopneumonia: significantly improving. Finish prednisone taper. Continue mucinex dm prn. Continue to hydrate, increase activity slowly. Signs/symptoms to call or return for were reviewed and pt expressed understanding.  An After Visit Summary was printed and given to the patient.  FOLLOW UP: Return if symptoms worsen or fail to improve.  Signed:  Crissie Sickles, MD           08/30/2018

## 2018-09-03 ENCOUNTER — Telehealth: Payer: Self-pay | Admitting: Family Medicine

## 2018-09-03 ENCOUNTER — Other Ambulatory Visit: Payer: Self-pay | Admitting: Family Medicine

## 2018-09-03 NOTE — Telephone Encounter (Signed)
Copied from [place] [id number]. Topic: General - Other >> Sep 03, 2018 11:49 AM Keene Breath wrote: Reason for CRM: Patient's wife called to request that the doctor send in [place] flu for patient because he is still having flu like symptoms.  Patient stated that he has seen the doctor 3 times in the last 10 days and does not want to come in again for the same thing.  Would rather get some medication called in.  Please advise and call patient's wife at [phone]

## 2018-09-03 NOTE — Telephone Encounter (Signed)
Calling in tamiflu would not be appropriate for him based on the last 2 visits I have had with him. He has to be seen in the office before any further prescription medication can be considered. -thx

## 2018-09-03 NOTE — Telephone Encounter (Signed)
Please advise. Thanks.  

## 2018-09-03 NOTE — Telephone Encounter (Signed)
SW pts wife, she was advised and voiced understanding. She spoke with pt and he declined scheduling an apt at this time will go to UC or call back for apt in a few days if not feeling better. Pts wife did mention that pt has been running a fever 101F and has been taking tylenol. I asked again if pt was sure he did not want an apt. She asked pt and he declined again.

## 2018-09-05 ENCOUNTER — Ambulatory Visit: Payer: Self-pay

## 2018-09-05 ENCOUNTER — Encounter: Payer: Self-pay | Admitting: Family Medicine

## 2018-09-05 NOTE — Telephone Encounter (Signed)
Incoming call  From patient with complaint of coughing.  Patient rate the cough as severe.  Denies respiratory distress.  Denies coughing up blood.  Patient reports  Nose is running and occasional wheezing when lying down.Patient states sounds like there is a whistle  In his troat.    Provided an appoint appointment for1/21/20 @ 830am Patient voiced understanding. Reviewed, care advice.  Voiced understanding.                                                                          Reason for Disposition . Cough has been present for > 3 weeks  Answer Assessment - Initial Assessment Questions 1. ONSET: "When did the cough begin?"      2 weeks ago 2. SEVERITY: "How bad is the cough today?"      severe 3. RESPIRATORY DISTRESS: "Describe your breathing."      Breathing ok  4. FEVER: "Do you have a fever?" If so, ask: "What is your temperature, how was it measured, and when did it start?"     Yes 102.5 5. HEMOPTYSIS: "Are you coughing up any blood?" If so ask: "How much?" (flecks, streaks, tablespoons, etc.)     no 6. TREATMENT: "What have you done so far to treat the cough?" (e.g., meds, fluids, humidifier)     no 7. CARDIAC HISTORY: "Do you have any history of heart disease?" (e.g., heart attack, congestive heart failure)      No  8. LUNG HISTORY: "Do you have any history of lung disease?"  (e.g., pulmonary embolus, asthma, emphysema)      Tumor removed 9. PE RISK FACTORS: "Do you have a history of blood clots?" (or: recent major surgery, recent prolonged travel, bedridden)     *no 10. OTHER SYMPTOMS: "Do you have any other symptoms? (e.g., runny nose, wheezing, chest pain)       Wheezing when laying down.  Nose is runing 11. PREGNANCY: "Is there any chance you are pregnant?" "When was your last menstrual period?"       na 12. TRAVEL: "Have you traveled out of the country in the last month?" (e.g., travel history, exposures)       denies  Protocols used: COUGH - ACUTE  NON-PRODUCTIVE-A-AH

## 2018-09-06 ENCOUNTER — Ambulatory Visit: Payer: Medicare Other | Admitting: Physician Assistant

## 2018-09-06 DIAGNOSIS — J188 Other pneumonia, unspecified organism: Secondary | ICD-10-CM | POA: Diagnosis not present

## 2018-09-06 DIAGNOSIS — J189 Pneumonia, unspecified organism: Secondary | ICD-10-CM

## 2018-09-06 HISTORY — DX: Pneumonia, unspecified organism: J18.9

## 2018-09-10 ENCOUNTER — Encounter: Payer: Self-pay | Admitting: Family Medicine

## 2018-09-10 ENCOUNTER — Ambulatory Visit (INDEPENDENT_AMBULATORY_CARE_PROVIDER_SITE_OTHER): Payer: Medicare Other | Admitting: Family Medicine

## 2018-09-10 ENCOUNTER — Ambulatory Visit: Payer: Medicare Other | Admitting: Neurology

## 2018-09-10 VITALS — BP 123/73 | HR 80 | Temp 98.1°F | Resp 16 | Ht 63.0 in | Wt 148.5 lb

## 2018-09-10 DIAGNOSIS — J180 Bronchopneumonia, unspecified organism: Secondary | ICD-10-CM

## 2018-09-10 NOTE — Patient Instructions (Signed)
Get otc generic robitussin DM OR Mucinex DM and use as directed on the packaging for cough and congestion. Use otc generic saline nasal spray 2-3 times per day to irrigate/moisturize your nasal passages.   

## 2018-09-10 NOTE — Progress Notes (Signed)
OFFICE VISIT  09/10/2018   CC:  Chief Complaint  Patient presents with  . Cough    Went to urgent care 09/06/18, dx with Pnuemonia. Feeling much better but still continues to have trouble sleeping. Not fasting    HPI:    Patient is a 78 y.o. Caucasian male who presents for follow up of cough. I dx'd him with bronchopneumonia and rx'd steroids and abx and at last visit 11 days ago he was improving appropriately.   His cough continued significantly, though, and he started to get fever (up to 103), HA.  He went to [facility] 09/06/2018 and a CXR showed interval development of mild infiltrate in left mid to lower lung field and possibly right lung base.  He was rx'd levaquin 750 mg x 5d. He feels 80% better at this time.  No significant adverse effects from levaquin.   Still some coughing that is more of an upper airway type.  No chest tightness.  No more fever. HA improving, responds to tylenol.  NO SOB.  Energy returning very well. Not taking cough medication at this time.  Past Medical History:  Diagnosis Date  . AAA (abdominal aortic aneurysm) (HCC)    s/p repair (aortoiliac bipass; with persistent endograft leak in inferior mesenteric artery)--stable as of 01/2017 vascular f/u.  Marland Kitchen Asymptomatic cholelithiasis 07/2015   Incidental finding on PET CT  . Back pain 04/19/2016  . BPH with elevated PSA    PSA signif rise 11/2017 (5.4 in 2015 to 31.21 November 2017.  Urol (Dr. Lovena Neighbours) to do MRI prostate, then potentially prostate bx.  . Coronary artery disease    MI 1992, S/P  PTCA; negative stress test in November 2011 with no ischemia.   . Diverticulosis   . Elevated PSA 01/2018   01/22/18:  PSA 26.5.  Prostate MRI with abnormality->subsequent prostate bx BENIGN 04/12/18.  Rpt PSA 03/07/18 stable at 24.1-->f/u 1 yr with urologist.  . [name] hematuria 05/2018   Occurred 2+ mo's after prostate bx: attributed to bx by urol, treated empirically with keflex.  Urol suspects this is bleeding was  sequela of his recent prostate bx.  . Hearing loss    Bilateral   . History of radiation therapy 11/10/13-12/12/13   lung,50Gy/39fx  . Hyperlipidemia    Crestor= myalgias  . Hypertension   . Lung cancer ([place]) 05/2013   non-small cell;  L upper lobectomy with mediastinal LN dissection, chemo, and radiation in 2014/2015. Remission until pt had questionable seizure 07/2015--MRI showed brain mets; palliative brain rad (stereotactic radiation therapy) started 08/25/15.  CT C/A/P clear 02/2016, 05/2016, and 11/2016.  Plan per onc is to repeat in 6 mo.  . Malignant neoplasm of upper lobe, left bronchus or lung ([place]) 10/29/2013   ?New spiculated 4.5cm mass in L upper lung field on CXR at [facility] in [place] 07/14/17.  Dr. Julien Nordmann (onc) recommended f/u CXR after abx course.  If mass unchanged then repeat CT chest.  Surveillance CT chest and MRI brain 06/2018 show no sign of dz.  . Malignant neoplasm of upper lobe, left bronchus or lung ([place]) 06/26/2013  . Myocardial infarction [facility]) 1992   Dr Angelena Form    . Peripheral vascular disease ([place]) 08/2012   4.8x4.6 cm infrarenal abdominal aortic fusiform aneurysm, 1.5 cm right common iliac artery aneurysm.  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  . S/P radiation therapy  [facility] 08/25/15   Stereotactic radiation therapy: frontoparietal 18gy,posterior frontal lobe 20gy,  . Seizure disorder ([place]) 2016/2017   as sequela of brain mets;  Grand mal seizure 07/2015, then got on keppra and was seizure-free until focal motor seizures of L side of face began 02/2016- these responded well to up-titration of keppra but pt had adverse side effects so eventually pt had to be switched over to lamictal 07/2016--stable/seizure free on this med as of 01/2018 neuro f/u.  Marland Kitchen Shortness of breath   . Tobacco dependence    "Quit" 1992, but pt has smoked "on and off" since that time     Past Surgical History:  Procedure Laterality Date  . ABDOMINAL AORTIC ENDOVASCULAR STENT GRAFT N/A 05/07/2014   Procedure: ABDOMINAL AORTIC ENDOVASCULAR STENT GRAFT;  Surgeon: Serafina Mitchell, MD;  Location: [place] OR;  Service: Vascular;  Laterality: N/A;  . Carotid duplex dopplers  07/2015   1-39% on R, no signif dz noted on L  . COLONOSCOPY W/ POLYPECTOMY  2003    negative 2010,due 2020; Dr Olevia Perches  . CORONARY ANGIOPLASTY     no stents  . CYSTOSCOPY/RETROGRADE/URETEROSCOPY Bilateral 10/09/2012   Procedure: BILATERAL RETROGRADE bladder and urethral BIOPSY ;  Surgeon: Molli Hazard, MD;  Location: [facility];  Service: Urology;  Laterality: Bilateral;  BILATERAL RETROGRADE   . EEG  08/05/15   Pt placed on keppra just prior to this test due to having ? seizure (MRI showed brain mets)  . HERNIA REPAIR Bilateral    Inguinal  . INGUINAL HERIIORRHAPHY BILATERALLY    . IR ANGIOGRAM SELECTIVE EACH ADDITIONAL VESSEL  12/03/2017  . IR ANGIOGRAM VISCERAL SELECTIVE  12/03/2017  . IR ANGIOGRAM VISCERAL SELECTIVE  12/03/2017  . IR EMBO ARTERIAL NOT HEMORR HEMANG INC GUIDE ROADMAPPING  12/03/2017  . IR GENERIC HISTORICAL  07/20/2016   IR RADIOLOGIST EVAL & MGMT 07/20/2016 [facility] INTERV RAD  . IR GENERIC HISTORICAL  08/02/2016   IR RADIOLOGIST EVAL & MGMT 08/02/2016 Sandi Mariscal, MD [facility] INTERV RAD  . IR RADIOLOGIST EVAL & MGMT  11/08/2017  . IR RADIOLOGIST EVAL & MGMT  01/01/2018  . IR US GUIDE VASC ACCESS RIGHT  12/03/2017  . MEDIASTINOSCOPY N/A 05/01/2013   Procedure: MEDIASTINOSCOPY;  Surgeon: Gaye Pollack, MD;  Location: [facility] OR;  Service: Thoracic;  Laterality: N/A;  . PFTs  04/2013   Minimal obstructive airway disease  . PILONIDAL CYST EXCISION    . PROSTATE BIOPSY N/A 10/09/2012   NEG bx 04/12/18 as well.  Procedure: PROSTATIC URETHRAL BIOPSY--BPH--no evidence of malignancy;  Surgeon: Molli Hazard, MD;  Location: [facility];  Service: Urology;  Laterality: N/A;  PROSTATIC URETHRAL BIOPSY   . PROSTATE BIOPSY  03/2018   NEG  . PTCA  1992  . ROTATOR CUFF REPAIR     Bilateral  . THOROCOTOMY WITH LOBECTOMY Left 05/29/2013   Procedure: LEFT THOROCOTOMY WITH LEFT UPPER LOBE LOBECTOMY;  Surgeon: Gaye Pollack, MD;  Location: [place];  Service: Thoracic;  Laterality: Left;  Marland Kitchen VIDEO BRONCHOSCOPY N/A 05/01/2013   Procedure: VIDEO BRONCHOSCOPY;  Surgeon: Gaye Pollack, MD;  Location: [facility] OR;  Service: Thoracic;  Laterality: N/A;    Outpatient Medications Prior to Visit  Medication Sig Dispense Refill  . albuterol (PROVENTIL HFA;VENTOLIN HFA) 108 (90 Base) MCG/ACT inhaler Inhale 2 puffs into the lungs every 6 (six) hours as needed for wheezing or shortness of breath.    Marland Kitchen amLODipine (NORVASC) 5 MG tablet TAKE 1 TABLET BY MOUTH  EVERY DAY 90 tablet 1  . aspirin EC 81 MG tablet Take 81 mg by mouth at bedtime.     Marland Kitchen atorvastatin (LIPITOR) 20 MG tablet Take 1 tablet (20 mg total) by mouth daily. 90 tablet 1  . budesonide-formoterol (SYMBICORT) 160-4.5 MCG/ACT inhaler INHALE 1-2 PUFFS INTO THE LINGS EVERY 12 HOURS. GARGLE AND SPIT AFTER USE. 10.2 Inhaler 3  . diphenhydrAMINE (BENADRYL) 50 MG tablet Take 1 tablet (50 mg total) by mouth as directed. Take 1 tablet (50 mg) by mouth 1 hr prior to CTA (scheduled for 11/08/2017) 1 tablet 0  . lamoTRIgine (LAMICTAL) 200 MG tablet TAKE 1 TABLET BY MOUTH IN THE MORNING AND 1.5 TABLETS IN THE EVENING 72 tablet 5  . levofloxacin (LEVAQUIN) 750 MG tablet Take 750 mg by mouth daily. Take one tablet daily x5 days. Ends 09/10/2018    . metoprolol tartrate (LOPRESSOR) 50 MG tablet TAKE 1 TABLET BY MOUTH TWICE A DAY 180 tablet 1  . Multiple Vitamin (MULTIVITAMIN) tablet Take 1 tablet by mouth daily.     . pentoxifylline (TRENTAL) 400 MG CR tablet Take 1 tablet (400 mg total) by mouth 3 (three) times daily. 90 tablet 5  . predniSONE (DELTASONE) 50 MG tablet Take 1 tablet (50 mg total) by mouth as directed. Take Prednisone 50 mg by mouth 13 hrs, 7 hrs and 1 hr prior to  CTA (scheduled 11/08/2017). 3 tablet 0  . predniSONE (DELTASONE) 20 MG tablet 2 tabs po qd x 5d, then 1 tab po qd x 5d (Patient not taking: Reported on 09/10/2018) 15 tablet 0   No facility-administered medications prior to visit.     Allergies  Allergen Reactions  . Fluvirin [Influenza Vac Split Quad] Nausea And Vomiting and Other (See Comments)    Seizure like activity?  Marland Kitchen Rosuvastatin Other (See Comments)    Myalgias and dark urine  . Iodinated Diagnostic Agents Hives    1 hive on lt cheek lasting approximately 1 hour on last 2 CT per pt; needs pre meds in future; 50 mg benadryl po 1 hr prior to exam per Dr. Weber Cooks 07/2014  11/2016 per Tery Sanfilippo, pt needs 13hr premeds per our policy.    ROS As per HPI  PE: Blood pressure 123/73, pulse 80, temperature 98.1 F (36.7 C), temperature source Oral, resp. rate 16, height 5\' 3"  (1.6 m), weight 148 lb 8 oz (67.4 kg), SpO2 98 %. Gen: Alert, well appearing.  Patient is oriented to person, place, time, and situation. AFFECT: pleasant, lucid thought and speech. Neck - No masses or thyromegaly or limitation in range of motion CV: RRR, no m/r/g.   LUNGS: CTA bilat, nonlabored resps, good aeration in all lung fields.   LABS:    Chemistry      Component Value Date/Time   NA 141 07/02/2018 1106   NA 141 06/18/2017 1006   K 5.0 07/02/2018 1106   K 5.3 (H) 06/18/2017 1006   CL 107 07/02/2018 1106   CO2 25 07/02/2018 1106   CO2 25 06/18/2017 1006   BUN 14 07/02/2018 1106   BUN 14.3 06/18/2017 1006   CREATININE 1.05 07/02/2018 1106   CREATININE 0.9 06/18/2017 1006      Component Value Date/Time   CALCIUM 10.0 07/02/2018 1106   CALCIUM 10.3 06/18/2017 1006   ALKPHOS 66 07/02/2018 1106   ALKPHOS 63 06/18/2017 1006   AST 16 07/02/2018 1106   AST 15 06/18/2017 1006   ALT 20 07/02/2018 1106   ALT 17 06/18/2017  1006   BILITOT 0.3 07/02/2018 1106   BILITOT 0.35 06/18/2017 1006     Lab Results  Component Value Date   WBC 8.3  07/02/2018   HGB 15.0 07/02/2018   HCT 44.6 07/02/2018   MCV 86.8 07/02/2018   PLT 245 07/02/2018   Lab Results  Component Value Date   HGBA1C 5.7 07/04/2013    IMPRESSION AND PLAN:  CAP: improving appropriately on levaquin. Continue with abx (1 more day).  Use mucinex dm for cough. He has proair in case of wheezing, but he describes only pseudo-wheeze today. I reviewed his visit note from [place] [facility] today.  Signs/symptoms to call or return for were reviewed and pt expressed understanding.   An After Visit Summary was printed and given to the patient.  FOLLOW UP: Return if symptoms worsen or fail to improve.  Signed:  Crissie Sickles, MD           09/10/2018

## 2018-09-11 ENCOUNTER — Other Ambulatory Visit: Payer: Self-pay | Admitting: Family Medicine

## 2018-09-17 ENCOUNTER — Other Ambulatory Visit: Payer: Self-pay | Admitting: Neurology

## 2018-09-24 ENCOUNTER — Ambulatory Visit: Payer: Medicare Other | Admitting: Podiatry

## 2018-09-26 ENCOUNTER — Ambulatory Visit: Payer: Medicare Other | Admitting: Neurology

## 2018-09-27 DIAGNOSIS — H2513 Age-related nuclear cataract, bilateral: Secondary | ICD-10-CM | POA: Diagnosis not present

## 2018-09-27 DIAGNOSIS — H2512 Age-related nuclear cataract, left eye: Secondary | ICD-10-CM | POA: Diagnosis not present

## 2018-10-01 ENCOUNTER — Encounter: Payer: Self-pay | Admitting: Podiatry

## 2018-10-01 ENCOUNTER — Ambulatory Visit: Payer: Medicare Other | Admitting: Podiatry

## 2018-10-01 DIAGNOSIS — M79674 Pain in right toe(s): Secondary | ICD-10-CM

## 2018-10-01 DIAGNOSIS — B351 Tinea unguium: Secondary | ICD-10-CM | POA: Diagnosis not present

## 2018-10-01 DIAGNOSIS — M79675 Pain in left toe(s): Secondary | ICD-10-CM

## 2018-10-01 NOTE — Patient Instructions (Signed)

## 2018-10-01 NOTE — Progress Notes (Signed)
Subjective: Jordan Mccullough is a 78 y.o. y.o. male who presents today with painful, discolored, thick toenails  which interfere with daily activities. Pain is aggravated when wearing enclosed shoe gear. Pain is relieved with periodic professional debridement.   Patient's PCP is McGowen, Adrian Blackwater, MD and last date seen was 09/10/2018.   Current Outpatient Medications:  .  albuterol (PROVENTIL HFA;VENTOLIN HFA) 108 (90 Base) MCG/ACT inhaler, Inhale 2 puffs into the lungs every 6 (six) hours as needed for wheezing or shortness of breath., Disp: , Rfl:  .  amLODipine (NORVASC) 5 MG tablet, TAKE 1 TABLET BY MOUTH EVERY DAY, Disp: 90 tablet, Rfl: 1 .  aspirin EC 81 MG tablet, Take 81 mg by mouth at bedtime. , Disp: , Rfl:  .  atorvastatin (LIPITOR) 20 MG tablet, Take 1 tablet (20 mg total) by mouth daily., Disp: 90 tablet, Rfl: 1 .  budesonide-formoterol (SYMBICORT) 160-4.5 MCG/ACT inhaler, INHALE 1-2 PUFFS INTO THE LINGS EVERY 12 HOURS. GARGLE AND SPIT AFTER USE., Disp: 30.6 Inhaler, Rfl: 1 .  diphenhydrAMINE (BENADRYL) 50 MG tablet, Take 1 tablet (50 mg total) by mouth as directed. Take 1 tablet (50 mg) by mouth 1 hr prior to CTA (scheduled for 11/08/2017), Disp: 1 tablet, Rfl: 0 .  lamoTRIgine (LAMICTAL) 200 MG tablet, TAKE 1 TABLET BY MOUTH IN THE MORNING AND 1.5 TABLETS IN THE EVENING, Disp: 216 tablet, Rfl: 1 .  levofloxacin (LEVAQUIN) 750 MG tablet, Take 750 mg by mouth daily. Take one tablet daily x5 days. Ends 09/10/2018, Disp: , Rfl:  .  metoprolol tartrate (LOPRESSOR) 50 MG tablet, TAKE 1 TABLET BY MOUTH TWICE A DAY, Disp: 180 tablet, Rfl: 0 .  Multiple Vitamin (MULTIVITAMIN) tablet, Take 1 tablet by mouth daily. , Disp: , Rfl:  .  pentoxifylline (TRENTAL) 400 MG CR tablet, Take 1 tablet (400 mg total) by mouth 3 (three) times daily., Disp: 90 tablet, Rfl: 5   Allergies  Allergen Reactions  . Fluvirin [Influenza Vac Split Quad] Nausea And Vomiting and Other (See Comments)    Seizure like  activity?  . Quinolones Other (See Comments)    Pt with aneurism: need to avoid quinoline use b/c of weakening of vascular wall that can be associated with quinolones.  . Rosuvastatin Other (See Comments)    Myalgias and dark urine  . Iodinated Diagnostic Agents Hives    1 hive on lt cheek lasting approximately 1 hour on last 2 CT per pt; needs pre meds in future; 50 mg benadryl po 1 hr prior to exam per Dr. Weber Cooks 07/2014  11/2016 per Tery Sanfilippo, pt needs 13hr premeds per our policy.     Objective:  Vascular Examination: Capillary refill time immediate x 10 digits Dorsalis pedis and Posterior tibial pulses present b/l Digital hair x 10 digits absent Skin temperature gradient WNL b/l  Dermatological Examination: Pedal skin is thin, shiny and atrophic b/l  Toenails 1-5 b/l discolored, thick, dystrophic with subungual debris and pain with palpation to nailbeds due to thickness of nails.  No open wounds b/l.  No interdigital macerations b/l.  Musculoskeletal: Muscle strength 5/5 to all LE muscle groups  Neurological: Sensation intact with 10 gram monofilament.  Vibratory sensation intact.  Assessment: Painful onychomycosis toenails 1-5 b/l in patient on blood thinner.   Plan: 1. Toenails 1-5 b/l were debrided in length and girth without iatrogenic bleeding. 2. Patient to continue soft, supportive shoe gear 3. Patient to report any pedal injuries to medical professional immediately. 4. Avoid self trimming  due to use of blood thinner. 5. Follow up 3 months.  6. Patient/POA to call should there be a concern in the interim.

## 2018-10-10 ENCOUNTER — Encounter: Payer: Self-pay | Admitting: Neurology

## 2018-10-10 ENCOUNTER — Ambulatory Visit: Payer: Medicare Other | Admitting: Neurology

## 2018-10-10 ENCOUNTER — Other Ambulatory Visit: Payer: Self-pay

## 2018-10-10 VITALS — BP 148/80 | HR 81 | Ht 63.0 in | Wt 148.0 lb

## 2018-10-10 DIAGNOSIS — G40209 Localization-related (focal) (partial) symptomatic epilepsy and epileptic syndromes with complex partial seizures, not intractable, without status epilepticus: Secondary | ICD-10-CM | POA: Diagnosis not present

## 2018-10-10 DIAGNOSIS — C7931 Secondary malignant neoplasm of brain: Secondary | ICD-10-CM | POA: Diagnosis not present

## 2018-10-10 MED ORDER — LAMOTRIGINE 200 MG PO TABS: ORAL_TABLET | ORAL | 3 refills | Status: DC

## 2018-10-10 NOTE — Progress Notes (Signed)
NEUROLOGY FOLLOW UP OFFICE NOTE  Jordan Mccullough [id number]  DOB: 23-Jan-1941  HISTORY OF PRESENT ILLNESS: I had the pleasure of seeing Jordan Mccullough in follow-up in the neurology clinic on 10/10/2018. The patient was last seen 77 months ago for seizures secondary to brain metastases. He continues to do well with no convulsions since 2016, no focal motor seizures affecting the left side of his face since May 2018. His Lamotrigine dose was adjusted, he is taking Lamotrigine 200mg  1 tab in AM, 1.5 tabs in PM with no side effects. He had a follow-up MRI brain with and without contrast in November 2019 which I personally reviewed, no new lesions seen, stable right parietal lesions with chronic hemorrhagic lesion 12x62mm in right lateral parietal lobe and treated high right parietal lobe lesion 12x63mm with surrouding FLAIR hyperintensity. He continues to report left hand dexterity issues and left leg weakness where he has to be more conscious when walking, but has not noticed any worsening of the weakness. He mostly notices it when he tries to do fine movements with his plane. He has noticed he does not feel food on the left side of his mouth, but denies any numbness/tingling in his left arm/leg. He denies any headaches, dizziness, vision changes, no falls. Sleep is better, he usually gets 6-7 hours of restful sleep. He states he is always tired because of his leg. No falls.  HPI 10/11/15: This is a pleasant 78 yo RH man with a history of lung cancer s/p lobectomy, chemotherapy and radiation, hyperlipidemia, CAD s/p MI, peripheral vascular disease, abdominal aortic aneurysm, admitted to [facility] last 08/04/15 after a new onset seizure and found to have brain metastases. He states he got his flu shot then went to work, got home and started having a beer the threw up, his eyes got blurry to the point he could not see anything, then has no recollection of events. According to ER notes, after the nausea and vomiting, he  decided to drink beer to feel better, his wife left the room then came back to find him with arms extended, shaking, eyes rolled back lasting 5-10 seconds. He recalls coming to without feeling confused, no focal weakness. He was brought to [facility] ER where CT head showed a 1.6 x 1.5 cm hyperdense mass in the right frontal lobe worrisome for focal metastases. MRI was done showing 2 enhancing masses in the right frontal lobe measuring up to 2 x 1.7 cm, no mass effect. He was started on Keppra. He underwent stereotactic radiation.   He called our office in July 2017 to report facial seizures. He had 5 in one day lasting 5-6 seconds, then stopped. His wife called our office to report 4 facial seizures on 7/19 and one on 7/20, these were lasting 5-6 seconds, he could feel it coming from the left jawline going up to his forehead, with facial twitching seen. No associated headache or confusion. He has a constant feeling of numbness on the left side of his face, "like you had Novocaine." He continues to have left arm and leg weakness that has been chronic, no twitching involved. He feels he has a hard time controlling them. He denies any falls. Keppra dose was increased to 1000mg  qhs.  He denies any prior history of seizures. His wife denies any staring/unresponsive episodes, no gaps in time, olfactory/gustatory hallucinations, deja vu, rising epigastric sensation, focal numbness/tingling/weakness, myoclonic jerks, headaches, dysarthria/dysphagia, bowel/bladder dysfunction. He was discharged home on Decadron and Keppra, and reports that  he was "just totally out of it," functioning only 50%. He felt significantly better getting of Decadron, but continued to have generalized weakness, particularly in the legs, blurred vision, and reported this to his PCP Dr. Anitra Mccullough. Keppra dose was reduced to 250mg  BID 5 days ago, and he reports that symptoms are better, but still there. He states that he wakes up feeling fine, but then  starts feeling the symptoms after taking morning Keppra dose. He has mild 2-3 over 10 back pain when walking, relieved when he sits down. He continues to drink beer daily but has reduced to 1-2 a day from 3-5 beers a day. His wife reports he would drink 3 beer cases in a week.  Epilepsy Risk Factors: Brain metastases in the right frontal lobe. Otherwise he had a normal birth and early development. There is no history of febrile convulsions, CNS infections such as meningitis/encephalitis, significant traumatic brain injury, neurosurgical procedures, or family history of seizures.   I personally reviewed MRI brain with and without contrast done 08/12/15 which showed an 8x27mm centrally necrotic metastasis in the right posterior frontal lobe; there is a centrally necrotic mass measuring 7mm at the right vertex frontoparietal junction with regional vasogenic edema. Routine EEG 08/05/15 was a normal wake and sleep study  PAST MEDICAL HISTORY: Past Medical History:  Diagnosis Date  . AAA (abdominal aortic aneurysm) (HCC)    s/p repair (aortoiliac bipass; with persistent endograft leak in inferior mesenteric artery)--stable as of 01/2017 vascular f/u.  Marland Kitchen Asymptomatic cholelithiasis 07/2015   Incidental finding on PET CT  . Back pain 04/19/2016  . BPH with elevated PSA    PSA signif rise 11/2017 (5.4 in 2015 to 31.21 November 2017.  Urol (Dr. Lovena Neighbours) to do MRI prostate, then potentially prostate bx.  . Coronary artery disease    MI 1992, S/P  PTCA; negative stress test in November 2011 with no ischemia.   . Diverticulosis   . Elevated PSA 01/2018   01/22/18:  PSA 26.5.  Prostate MRI with abnormality->subsequent prostate bx BENIGN 04/12/18.  Rpt PSA 03/07/18 stable at 24.1-->f/u 1 yr with urologist.  . [name] hematuria 05/2018   Occurred 2+ mo's after prostate bx: attributed to bx by urol, treated empirically with keflex.  Urol suspects this is bleeding was sequela of his recent prostate bx.  . Hearing loss      Bilateral   . History of radiation therapy 11/10/13-12/12/13   lung,50Gy/61fx  . Hyperlipidemia    Crestor= myalgias  . Hypertension   . Lung cancer ([place]) 05/2013   non-small cell;  L upper lobectomy with mediastinal LN dissection, chemo, and radiation in 2014/2015. Remission until pt had questionable seizure 07/2015--MRI showed brain mets; palliative brain rad (stereotactic radiation therapy) started 08/25/15.  CT C/A/P clear 02/2016, 05/2016, and 11/2016.  Plan per onc is to repeat in 6 mo.  . Malignant neoplasm of upper lobe, left bronchus or lung ([place]) 10/29/2013   ?New spiculated 4.5cm mass in L upper lung field on CXR at [facility] in [place] 07/14/17.  Dr. Julien Nordmann (onc) recommended f/u CXR after abx course.  If mass unchanged then repeat CT chest.  Surveillance CT chest and MRI brain 06/2018 show no sign of dz.  . Malignant neoplasm of upper lobe, left bronchus or lung ([place]) 06/26/2013  . Myocardial infarction [facility]) 1992   Dr Angelena Form    . Peripheral vascular disease ([place]) 08/2012   4.8x4.6 cm infrarenal abdominal aortic fusiform aneurysm, 1.5 cm right common  iliac artery aneurysm.  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  Marland Kitchen Pneumonia 09/06/2018   XRAY was done at [place] [facility].  . S/P radiation therapy [facility] 08/25/15   Stereotactic radiation therapy: frontoparietal 18gy,posterior frontal lobe 20gy,  . Seizure disorder ([place]) 2016/2017   as sequela of brain mets;  Grand mal seizure 07/2015, then got on keppra and was seizure-free until focal motor seizures of L side of face began 02/2016- these responded well to up-titration of keppra but pt had adverse side effects so eventually pt had to be switched over to lamictal 07/2016--stable/seizure free on this med as of 01/2018 neuro f/u.  Marland Kitchen Shortness of breath   . Tobacco dependence    "Quit" 1992, but pt has smoked "on and off"  since that time    MEDICATIONS:  Outpatient Encounter Medications as of 10/10/2018  Medication Sig  . albuterol (PROVENTIL HFA;VENTOLIN HFA) 108 (90 Base) MCG/ACT inhaler Inhale 2 puffs into the lungs every 6 (six) hours as needed for wheezing or shortness of breath.  Marland Kitchen amLODipine (NORVASC) 5 MG tablet TAKE 1 TABLET BY MOUTH EVERY DAY  . aspirin EC 81 MG tablet Take 81 mg by mouth at bedtime.   Marland Kitchen atorvastatin (LIPITOR) 20 MG tablet Take 1 tablet (20 mg total) by mouth daily.  . budesonide-formoterol (SYMBICORT) 160-4.5 MCG/ACT inhaler INHALE 1-2 PUFFS INTO THE LINGS EVERY 12 HOURS. GARGLE AND SPIT AFTER USE.  . diphenhydrAMINE (BENADRYL) 50 MG tablet Take 1 tablet (50 mg total) by mouth as directed. Take 1 tablet (50 mg) by mouth 1 hr prior to CTA (scheduled for 11/08/2017)  . DUREZOL 0.05 % EMUL INSTILL 1 DROP INTO LEFT EYE 3 TIMES A DAY AS DIRECTED  . lamoTRIgine (LAMICTAL) 200 MG tablet TAKE 1 TABLET BY MOUTH IN THE MORNING AND 1.5 TABLETS IN THE EVENING  . levofloxacin (LEVAQUIN) 750 MG tablet Take 750 mg by mouth daily. Take one tablet daily x5 days. Ends 09/10/2018  . metoprolol tartrate (LOPRESSOR) 50 MG tablet TAKE 1 TABLET BY MOUTH TWICE A DAY  . Multiple Vitamin (MULTIVITAMIN) tablet Take 1 tablet by mouth daily.   . pentoxifylline (TRENTAL) 400 MG CR tablet Take 1 tablet (400 mg total) by mouth 3 (three) times daily.  Marland Kitchen PROLENSA 0.07 % SOLN INSTILL 1 DROP INTO LEFT EYE AT BEDTIME AS DIRECTED   No facility-administered encounter medications on file as of 10/10/2018.     ALLERGIES: Allergies  Allergen Reactions  . Fluvirin [Influenza Vac Split Quad] Nausea And Vomiting and Other (See Comments)    Seizure like activity?  . Quinolones Other (See Comments)    Pt with aneurism: need to avoid quinoline use b/c of weakening of vascular wall that can be associated with quinolones.  . Rosuvastatin Other (See Comments)    Myalgias and dark urine  . Iodinated Diagnostic Agents Hives    1  hive on lt cheek lasting approximately 1 hour on last 2 CT per pt; needs pre meds in future; 50 mg benadryl po 1 hr prior to exam per Dr. Weber Cooks 07/2014  11/2016 per Tery Sanfilippo, pt needs 13hr premeds per our policy.    FAMILY HISTORY: Family History  Problem Relation Age of Onset  . Hypertension Mother   . Prostate cancer Father        Prostate cancer  . Cancer Father        Brain Tumor  .  Lung cancer Sister        NON SMOKER  . Cancer Sister   . Hyperlipidemia Sister   . Hyperlipidemia Brother   . Heart attack Brother   . Hypertension Brother   . Prostate cancer Brother   . Diabetes Neg Hx   . Stroke Neg Hx     SOCIAL HISTORY: Social History   Socioeconomic History  . Marital status: Married    Spouse name: Not on file  . Number of children: Not on file  . Years of education: Not on file  . Highest education level: Not on file  Occupational History  . Occupation: Retired Scientist, clinical (histocompatibility and immunogenetics)  . Financial resource strain: Not on file  . Food insecurity:    Worry: Not on file    Inability: Not on file  . Transportation needs:    Medical: Not on file    Non-medical: Not on file  Tobacco Use  . Smoking status: Current Every Day Smoker    Packs/day: 1.00    Years: 24.00    Pack years: 24.00    Types: Cigarettes    Last attempt to quit: 05/28/2013    Years since quitting: 5.3  . Smokeless tobacco: Never Used  . Tobacco comment: QUIT 15 YEARS AGO-07/03/17 smoking  Substance and Sexual Activity  . Alcohol use: Yes    Alcohol/week: 15.0 standard drinks    Types: 3 Glasses of wine, 12 Cans of beer per week    Comment: 1-2 beers a day  . Drug use: No  . Sexual activity: Not on file  Lifestyle  . Physical activity:    Days per week: Not on file    Minutes per session: Not on file  . Stress: Not on file  Relationships  . Social connections:    Talks on phone: Not on file    Gets together: Not on file    Attends religious service: Not on file    Active  member of club or organization: Not on file    Attends meetings of clubs or organizations: Not on file    Relationship status: Not on file  . Intimate partner violence:    Fear of current or ex partner: Not on file    Emotionally abused: Not on file    Physically abused: Not on file    Forced sexual activity: Not on file  Other Topics Concern  . Not on file  Social History Narrative   HEART HEALTHY DIET.     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   MARRIED, 2 children who live in [place].  Smoked on and off since then.   ALCOHOL USE -YES- RED WINE SOCIALLY, couple of beers at night usually.   07-08-18 Unable to ask abuse questions wife with him today.    REVIEW OF SYSTEMS: Constitutional: No fevers, chills, or sweats, +generalized fatigue, no change in appetite Eyes: No visual changes, double vision, eye pain Ear, nose and throat: No hearing loss, ear pain, nasal congestion, sore throat Cardiovascular: No chest pain, palpitations Respiratory:  No shortness of breath at rest or with exertion, wheezes GastrointestinaI: No nausea, vomiting, diarrhea, abdominal pain, fecal incontinence Genitourinary:  No dysuria, urinary retention or frequency Musculoskeletal:  No neck pain, +back pain Integumentary: No rash, pruritus, skin lesions Neurological: as above Psychiatric: No depression, insomnia, no anxiety Endocrine: No palpitations, fatigue, diaphoresis, mood swings, change in appetite, change in weight, increased thirst Hematologic/Lymphatic:  No anemia, purpura, petechiae. Allergic/Immunologic:  no itchy/runny eyes, nasal congestion, recent allergic reactions, rashes  PHYSICAL EXAM: Vitals:   10/10/18 1054  BP: (!) 148/80  Pulse: 81  SpO2: 94%   General: No acute distress Head:  Normocephalic/atraumatic Neck: supple, no paraspinal tenderness, full range of motion Heart:  Regular rate and rhythm Lungs:  Clear to auscultation bilaterally Back: No paraspinal  tenderness Skin/Extremities: No rash, no edema.  Neurological Exam: alert and oriented to person, place, and time. No aphasia or dysarthria. Fund of knowledge is appropriate.  Recent and remote memory are intact.  Attention and concentration are normal.    Able to name objects and repeat phrases. Cranial nerves: Pupils equal, round, reactive to light. Extraocular movements intact with no nystagmus. Visual fields full. Facial sensation intact. No facial asymmetry. Tongue, uvula, palate midline.  Motor: Bulk and tone normal, muscle strength 5/5 throughout with decreased fine finger movements on left hand. Sensation to light touch intact. Deep tendon reflexes brisk +2 throughout L>R, toes downgoing.  Finger to nose testing intact.  Gait narrow-based, favoring left leg.  IMPRESSION: This is a pleasant 78 yo RH man with a history of lung cancer s/p lobectomy, radiation, chemotherapy, found to have 2 right frontal brain metastases after he had a convulsive seizure on 08/04/15. Routine EEG normal. Follow-up MRI brain in November 2019 stable. No further convulsions since December 2016. No further focal motor seizures affecting his face since May 2018. Continue Lamotrigine 200mg  1 tab in AM, 1.5 tab in PM, no side effects. He is aware of [place] driving laws to stop driving after a seizure, until 6 months seizure-free. He will follow-up in 1 year and knows to call for any problems.  Thank you for allowing me to participate in his care.  Please do not hesitate to call for any questions or concerns.  The duration of this appointment visit was 15 minutes of face-to-face time with the patient.  Greater than 50% of this time was spent in counseling, explanation of diagnosis, planning of further management, and coordination of care.   Ellouise Newer, M.D.   CC: Dr. Anitra Mccullough

## 2018-10-10 NOTE — Patient Instructions (Signed)
Great seeing you! Continue Lamotrigine 200mg : Take 1 tablet in AM, 1.5 tablets in PM. Follow-up in 1 year, call for any changes  Seizure Precautions: 1. If medication has been prescribed for you to prevent seizures, take it exactly as directed.  Do not stop taking the medicine without talking to your doctor first, even if you have not had a seizure in a long time.   2. Avoid activities in which a seizure would cause danger to yourself or to others.  Don't operate dangerous machinery, swim alone, or climb in high or dangerous places, such as on ladders, roofs, or girders.  Do not drive unless your doctor says you may.  3. If you have any warning that you may have a seizure, lay down in a safe place where you can't hurt yourself.    4.  No driving for 6 months from last seizure, as per [facility].   Please refer to the following link on the [place] website for more information: http://[email or web address]/[email or web address]/[email or web address]   5.  Maintain good sleep hygiene. Avoid alcohol.  6.  Contact your doctor if you have any problems that may be related to the medicine you are taking.  7.  Call 911 and bring the patient back to the ED if:        A.  The seizure lasts longer than 5 minutes.       B.  The patient doesn't awaken shortly after the seizure  C.  The patient has new problems such as difficulty seeing, speaking or moving  D.  The patient was injured during the seizure  E.  The patient has a temperature over 102 F (39C)  F.  The patient vomited and now is having trouble breathing

## 2018-10-11 ENCOUNTER — Encounter: Payer: Self-pay | Admitting: Family Medicine

## 2018-10-18 ENCOUNTER — Encounter: Payer: Self-pay | Admitting: Family Medicine

## 2018-10-18 MED ORDER — ALBUTEROL SULFATE HFA 108 (90 BASE) MCG/ACT IN AERS: 2.0000 | INHALATION_SPRAY | Freq: Four times a day (QID) | RESPIRATORY_TRACT | 1 refills | Status: DC | PRN

## 2018-11-15 DIAGNOSIS — R310 Gross hematuria: Secondary | ICD-10-CM | POA: Diagnosis not present

## 2018-11-16 ENCOUNTER — Encounter (HOSPITAL_COMMUNITY): Payer: Self-pay | Admitting: Emergency Medicine

## 2018-11-16 ENCOUNTER — Other Ambulatory Visit: Payer: Self-pay

## 2018-11-16 ENCOUNTER — Emergency Department (HOSPITAL_COMMUNITY)
Admission: EM | Admit: 2018-11-16 | Discharge: 2018-11-16 | Disposition: A | Discharge status: Home / Self Care | Payer: Medicare Other | Attending: Emergency Medicine | Admitting: Emergency Medicine

## 2018-11-16 DIAGNOSIS — N4889 Other specified disorders of penis: Secondary | ICD-10-CM | POA: Diagnosis present

## 2018-11-16 DIAGNOSIS — I252 Old myocardial infarction: Secondary | ICD-10-CM | POA: Diagnosis not present

## 2018-11-16 DIAGNOSIS — F17210 Nicotine dependence, cigarettes, uncomplicated: Secondary | ICD-10-CM | POA: Diagnosis not present

## 2018-11-16 DIAGNOSIS — Z7982 Long term (current) use of aspirin: Secondary | ICD-10-CM | POA: Insufficient documentation

## 2018-11-16 DIAGNOSIS — I2510 Atherosclerotic heart disease of native coronary artery without angina pectoris: Secondary | ICD-10-CM | POA: Diagnosis not present

## 2018-11-16 DIAGNOSIS — Z79899 Other long term (current) drug therapy: Secondary | ICD-10-CM | POA: Insufficient documentation

## 2018-11-16 DIAGNOSIS — I10 Essential (primary) hypertension: Secondary | ICD-10-CM | POA: Diagnosis not present

## 2018-11-16 LAB — URINALYSIS, ROUTINE W REFLEX MICROSCOPIC
Bilirubin Urine: NEGATIVE
Glucose, UA: NEGATIVE mg/dL
Ketones, ur: NEGATIVE mg/dL
Nitrite: NEGATIVE
Protein, ur: 100 mg/dL — AB
RBC / HPF: >50 RBC/hpf — ABNORMAL HIGH (ref 0–5)
Specific Gravity, Urine: 1.018 (ref 1.005–1.030)
WBC, UA: >50 WBC/hpf — ABNORMAL HIGH (ref 0–5)
pH: 7 (ref 5.0–8.0)

## 2018-11-16 MED ORDER — ACETAMINOPHEN 500 MG PO TABS
1000.0000 mg | ORAL_TABLET | Freq: Once | ORAL | Status: AC
  Administered 2018-11-16: 1000 mg via ORAL
  Filled 2018-11-16: qty 2

## 2018-11-16 MED ORDER — ACETAMINOPHEN ER 650 MG PO TBCR: 650.0000 mg | EXTENDED_RELEASE_TABLET | Freq: Four times a day (QID) | ORAL | 0 refills | Status: DC | PRN

## 2018-11-16 NOTE — ED Notes (Signed)
Pt not c/o any issues with catheter draining.

## 2018-11-16 NOTE — ED Notes (Signed)
Urine culture sent to the lab. 

## 2018-11-16 NOTE — ED Triage Notes (Signed)
Patient states he is in pain in his scrotum and he thinks it is the catheter he has in. Patient states that if it is the catheter he wants it out.

## 2018-11-16 NOTE — Discharge Instructions (Signed)
Your pain is because of the procedure.  We expect the pain to get better over the next 1 to 2 days.  You might want to take the Tylenol 650 mg dose every 6 hours for pain. Additionally you might want to ice the area of discomfort gently for a few minutes.  Call urologist on Monday if you have severe pain.  Return to the ER if your pain gets unbearable.

## 2018-11-18 ENCOUNTER — Other Ambulatory Visit: Payer: Self-pay | Admitting: Radiation Therapy

## 2018-11-18 DIAGNOSIS — C7949 Secondary malignant neoplasm of other parts of nervous system: Secondary | ICD-10-CM

## 2018-11-18 DIAGNOSIS — C7931 Secondary malignant neoplasm of brain: Secondary | ICD-10-CM

## 2018-11-18 NOTE — ED Provider Notes (Signed)
[facility] DEPT Provider Note   CSN: [id number] Arrival date & time: 11/16/18  0218    History   Chief Complaint Chief Complaint  Patient presents with  . Urinary Retention    HPI Jordan Mccullough is a 78 y.o. male.     HPI  78 year old male with history of AAA, PVD, BPH comes in a chief complaint of penile pain.  Patient reports that he went to his urologist earlier because he was having some urinary symptoms.  They did an ultrasound and informed him that he had increased postvoid residual and a Foley catheter was placed.  Patient has been having pain over his penis ever since then.  The Foley catheter has been draining normally without any blood.  Patient denies any lower quadrant abdominal pain.  He took Tylenol and Motrin with minimal relief.  Past Medical History:  Diagnosis Date  . AAA (abdominal aortic aneurysm) (HCC)    s/p repair (aortoiliac bipass; with persistent endograft leak in inferior mesenteric artery)--stable as of 01/2017 vascular f/u.  Marland Kitchen Asymptomatic cholelithiasis 07/2015   Incidental finding on PET CT  . Back pain 04/19/2016  . BPH with elevated PSA    PSA signif rise 11/2017 (5.4 in 2015 to 31.21 November 2017.  Urol (Dr. Lovena Neighbours) to do MRI prostate, then potentially prostate bx.  . Coronary artery disease    MI 1992, S/P  PTCA; negative stress test in November 2011 with no ischemia.   . Diverticulosis   . Elevated PSA 01/2018   01/22/18:  PSA 26.5.  Prostate MRI with abnormality->subsequent prostate bx BENIGN 04/12/18.  Rpt PSA 03/07/18 stable at 24.1-->f/u 1 yr with urologist.  . [name] hematuria 05/2018   Occurred 2+ mo's after prostate bx: attributed to bx by urol, treated empirically with keflex.  Urol suspects this is bleeding was sequela of his recent prostate bx.  . Hearing loss    Bilateral   . History of radiation therapy 11/10/13-12/12/13   lung,50Gy/54fx  . Hyperlipidemia    Crestor= myalgias  . Hypertension   . Lung  cancer ([place]) 05/2013   non-small cell;  L upper lobectomy with mediastinal LN dissection, chemo, and radiation in 2014/2015. Remission until pt had questionable seizure 07/2015--MRI showed brain mets; palliative brain rad (stereotactic radiation therapy) started 08/25/15.  CT C/A/P clear 02/2016, 05/2016, and 11/2016.  Plan per onc is to repeat in 6 mo.  . Malignant neoplasm of upper lobe, left bronchus or lung ([place]) 10/29/2013   ?New spiculated 4.5cm mass in L upper lung field on CXR at [facility] in [place] 07/14/17.  Dr. Julien Nordmann (onc) recommended f/u CXR after abx course.  If mass unchanged then repeat CT chest.  Surveillance CT chest and MRI brain 06/2018 show no sign of dz.  . Malignant neoplasm of upper lobe, left bronchus or lung ([place]) 06/26/2013  . Myocardial infarction [facility]) 1992   Dr Angelena Form    . Peripheral vascular disease ([place]) 08/2012   4.8x4.6 cm infrarenal abdominal aortic fusiform aneurysm, 1.5 cm right common iliac artery aneurysm.  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  Marland Kitchen Pneumonia 09/06/2018   XRAY was done at [facility] care.  . S/P radiation therapy [facility] 08/25/15   Stereotactic radiation therapy: frontoparietal 18gy,posterior frontal lobe 20gy,  . Seizure disorder ([place]) 2016/2017   as sequela of brain mets;  Grand mal seizure 07/2015, then got  on keppra and was seizure-free until focal motor seizures of L side of face began 02/2016- these responded well to up-titration of keppra but pt had adverse side effects so eventually pt had to be switched over to lamictal 07/2016--stable/seizure free on this med as of 09/2018 neuro f/u.  Marland Kitchen Shortness of breath   . Tobacco dependence    "Quit" 1992, but pt has smoked "on and off" since that time    Patient Active Problem List   Diagnosis Date Noted  . Back pain 04/19/2016  . Seizure disorder ([place]) 04/03/2016  . DNR (do  not resuscitate)   . Palliative care encounter   . Complaints of leg weakness 11/08/2015  . 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 seizures, not intractable, without status epilepticus ([place]) 10/11/2015  . Syncope 08/05/2015  . Seizure-like activity ([place]) 08/04/2015  . Brain metastases ([place]) 08/04/2015  . Seizure ([place]) 08/04/2015  . AAA (abdominal aortic aneurysm) without rupture ([place]) 06/21/2015  . Aftercare following surgery of the circulatory system 06/21/2015  . Essential hypertension 06/23/2014  . Primary cancer of left upper lobe of lung ([place]) 06/19/2013  . History of AAA (abdominal aortic aneurysm) repair 10/14/2012  . Aneurysm of common iliac artery ([place]) 10/14/2012  . Elevated PSA 07/10/2012  . CAD (coronary artery disease) 07/31/2011  . DIVERTICULOSIS, COLON 07/05/2010  . COLONIC POLYPS 02/24/2010  . AMBLYOPIA, HX OF 02/24/2010  . HYPERGLYCEMIA, FASTING 05/15/2008  . Hyperlipidemia 05/23/2007  . MYOCARDIAL INFARCTION, HX OF 05/23/2007    Past Surgical History:  Procedure Laterality Date  . ABDOMINAL AORTIC ENDOVASCULAR STENT GRAFT N/A 05/07/2014   Procedure: ABDOMINAL AORTIC ENDOVASCULAR STENT GRAFT;  Surgeon: Serafina Mitchell, MD;  Location: [facility] OR;  Service: Vascular;  Laterality: N/A;  . Carotid duplex dopplers  07/2015   1-39% on R, no signif dz noted on L  . COLONOSCOPY W/ POLYPECTOMY  2003    negative 2010,due 2020; Dr Olevia Perches  . CORONARY ANGIOPLASTY     no stents  . CYSTOSCOPY/RETROGRADE/URETEROSCOPY Bilateral 10/09/2012   Procedure: BILATERAL RETROGRADE bladder and urethral BIOPSY ;  Surgeon: Molli Hazard, MD;  Location: [facility];  Service: Urology;  Laterality: Bilateral;  BILATERAL RETROGRADE   . EEG  08/05/15   Pt placed on keppra just prior to this test due to having ? seizure (MRI showed brain mets)  . HERNIA REPAIR Bilateral    Inguinal  . INGUINAL HERIIORRHAPHY BILATERALLY    . IR ANGIOGRAM SELECTIVE  EACH ADDITIONAL VESSEL  12/03/2017  . IR ANGIOGRAM VISCERAL SELECTIVE  12/03/2017  . IR ANGIOGRAM VISCERAL SELECTIVE  12/03/2017  . IR EMBO ARTERIAL NOT HEMORR HEMANG INC GUIDE ROADMAPPING  12/03/2017  . IR GENERIC HISTORICAL  07/20/2016   IR RADIOLOGIST EVAL & MGMT 07/20/2016 [facility] INTERV RAD  . IR GENERIC HISTORICAL  08/02/2016   IR RADIOLOGIST EVAL & MGMT 08/02/2016 Sandi Mariscal, MD [facility] INTERV RAD  . IR RADIOLOGIST EVAL & MGMT  11/08/2017  . IR RADIOLOGIST EVAL & MGMT  01/01/2018  . IR US GUIDE VASC ACCESS RIGHT  12/03/2017  . MEDIASTINOSCOPY N/A 05/01/2013   Procedure: MEDIASTINOSCOPY;  Surgeon: Gaye Pollack, MD;  Location: [facility] OR;  Service: Thoracic;  Laterality: N/A;  . PFTs  04/2013   Minimal obstructive airway disease  . PILONIDAL CYST EXCISION    . PROSTATE BIOPSY N/A 10/09/2012   NEG bx 04/12/18 as well.  Procedure: PROSTATIC URETHRAL BIOPSY--BPH--no evidence of malignancy;  Surgeon: Molli Hazard, MD;  Location: [facility];  Service: Urology;  Laterality: N/A;  PROSTATIC URETHRAL BIOPSY  . PROSTATE BIOPSY  03/2018   NEG  . PTCA  1992  . ROTATOR CUFF REPAIR     Bilateral  . THOROCOTOMY WITH LOBECTOMY Left 05/29/2013   Procedure: LEFT THOROCOTOMY WITH LEFT UPPER LOBE LOBECTOMY;  Surgeon: Gaye Pollack, MD;  Location: [place];  Service: Thoracic;  Laterality: Left;  Marland Kitchen VIDEO BRONCHOSCOPY N/A 05/01/2013   Procedure: VIDEO BRONCHOSCOPY;  Surgeon: Gaye Pollack, MD;  Location: [facility] OR;  Service: Thoracic;  Laterality: N/A;        Home Medications    Prior to Admission medications   Medication Sig Start Date End Date Taking? Authorizing Provider  albuterol (PROVENTIL HFA;VENTOLIN HFA) 108 (90 Base) MCG/ACT inhaler Inhale 2 puffs into the lungs every 6 (six) hours as needed for wheezing or shortness of breath. 10/18/18  Yes McGowen, Adrian Blackwater, MD  amLODipine (NORVASC) 5 MG tablet TAKE 1 TABLET BY MOUTH EVERY DAY Patient taking differently: Take 5 mg by mouth daily.  09/03/18  Yes  McGowen, Adrian Blackwater, MD  aspirin EC 81 MG tablet Take 81 mg by mouth at bedtime.    Yes [provider]  atorvastatin (LIPITOR) 20 MG tablet Take 1 tablet (20 mg total) by mouth daily. 06/07/18  Yes McGowen, Adrian Blackwater, MD  budesonide-formoterol (SYMBICORT) 160-4.5 MCG/ACT inhaler INHALE 1-2 PUFFS INTO THE LINGS EVERY 12 HOURS. GARGLE AND SPIT AFTER USE. Patient taking differently: Inhale 1-2 puffs into the lungs 2 (two) times daily.  09/11/18  Yes McGowen, Adrian Blackwater, MD  lamoTRIgine (LAMICTAL) 200 MG tablet TAKE 1 TABLET BY MOUTH IN THE MORNING AND 1.5 TABLETS IN THE EVENING Patient taking differently: Take 200-300 mg by mouth See admin instructions. TAKE 1 TABLET BY MOUTH IN THE MORNING AND 1.5 TABLETS IN THE EVENING 10/10/18  Yes Cameron Sprang, MD  metoprolol tartrate (LOPRESSOR) 50 MG tablet TAKE 1 TABLET BY MOUTH TWICE A DAY Patient taking differently: Take 50 mg by mouth 2 (two) times daily.  09/11/18  Yes McGowen, Adrian Blackwater, MD  Multiple Vitamin (MULTIVITAMIN WITH MINERALS) TABS tablet Take 1 tablet by mouth daily.   Yes [provider]  Potassium 99 MG TABS Take 1 tablet by mouth daily.   Yes [provider]  acetaminophen (TYLENOL 8 HOUR) 650 MG CR tablet Take 1 tablet (650 mg total) by mouth every 6 (six) hours as needed. 11/16/18   Varney Biles, MD  diphenhydrAMINE (BENADRYL) 50 MG tablet Take 1 tablet (50 mg total) by mouth as directed. Take 1 tablet (50 mg) by mouth 1 hr prior to CTA (scheduled for 11/08/2017) Patient not taking: Reported on 11/16/2018 10/23/17   Sandi Mariscal, MD  Multiple Vitamin (MULTIVITAMIN) tablet Take 1 tablet by mouth daily.     [provider]  pentoxifylline (TRENTAL) 400 MG CR tablet Take 1 tablet (400 mg total) by mouth 3 (three) times daily. Patient not taking: Reported on 11/16/2018 04/16/18   Hayden Pedro, PA-C    Family History Family History  Problem Relation Age of Onset  . Hypertension Mother   . Prostate cancer  Father        Prostate cancer  . Cancer Father        Brain Tumor  . Lung cancer Sister        NON SMOKER  . Cancer Sister   . Hyperlipidemia Sister   . Hyperlipidemia Brother   . Heart attack Brother   .  Hypertension Brother   . Prostate cancer Brother   . Diabetes Neg Hx   . Stroke Neg Hx     Social History Social History   Tobacco Use  . Smoking status: Current Every Day Smoker    Packs/day: 1.00    Years: 24.00    Pack years: 24.00    Types: Cigarettes    Last attempt to quit: 05/28/2013    Years since quitting: 5.4  . Smokeless tobacco: Never Used  . Tobacco comment: QUIT 15 YEARS AGO-07/03/17 smoking  Substance Use Topics  . Alcohol use: Yes    Alcohol/week: 15.0 standard drinks    Types: 3 Glasses of wine, 12 Cans of beer per week    Comment: 1-2 beers a day  . Drug use: No     Allergies   Fluvirin [influenza vac split quad]; Quinolones; Rosuvastatin; and Iodinated diagnostic agents   Review of Systems Review of Systems  Constitutional: Positive for activity change.  Genitourinary: Positive for penile pain.     Physical Exam Updated Vital Signs BP (!) 150/80 (BP Location: Left Arm)   Pulse 90   Temp 97.8 F (36.6 C) (Oral)   Resp 18   Ht 5\' 3"  (1.6 m)   Wt 65.8 kg   SpO2 100%   BMI 25.69 kg/m   Physical Exam Vitals signs and nursing note reviewed.  Constitutional:      Appearance: He is well-developed.  HENT:     Head: Atraumatic.  Neck:     Musculoskeletal: Neck supple.  Cardiovascular:     Rate and Rhythm: Normal rate.  Pulmonary:     Effort: Pulmonary effort is normal.  Abdominal:     Tenderness: There is no abdominal tenderness.  Genitourinary:    Comments: 24 French Foley catheter in place.  There is no blood at the meatus or deformity around the penis. Skin:    General: Skin is warm.  Neurological:     Mental Status: He is alert and oriented to person, place, and time.      ED Treatments / Results  Labs (all labs  ordered are listed, but only abnormal results are displayed) Labs Reviewed  URINALYSIS, ROUTINE W REFLEX MICROSCOPIC - Abnormal; Notable for the following components:      Result Value   Color, Urine AMBER (*)    APPearance CLOUDY (*)    Hgb urine dipstick LARGE (*)    Protein, ur 100 (*)    Leukocytes,Ua MODERATE (*)    RBC / HPF >50 (*)    WBC, UA >50 (*)    Bacteria, UA FEW (*)    All other components within normal limits    EKG None  Radiology No results found.  Procedures Procedures (including critical care time)  Medications Ordered in ED Medications  acetaminophen (TYLENOL) tablet 1,000 mg (1,000 mg Oral Given 11/16/18 0429)     Initial Impression / Assessment and Plan / ED Course  I have reviewed the triage vital signs and the nursing notes.  Pertinent labs & imaging results that were available during my care of the patient were reviewed by me and considered in my medical decision making (see chart for details).        Patient comes in a chief complaint of penile pain.  He really does not have any lower quadrant abdominal pain/suprapubic pain.  Suspect pain because of injury to the urethra from the insertion of the Foley catheter over spasming related pain.  Patient was given Tylenol  in the ER and he stated that his pain did improve.  I discussed with him that if we remove the Foley catheter and he starts having urinary issues and we will have to likely replace the Foley catheter.  Adhering that information, patient states that he rather just keep the Foley catheter in for now and he will contact his urologist if the pain persist.  Final Clinical Impressions(s) / ED Diagnoses   Final diagnoses:  Penile pain    ED Discharge Orders         Ordered    acetaminophen (TYLENOL 8 HOUR) 650 MG CR tablet  Every 6 hours PRN     11/16/18 4656           Varney Biles, MD 11/18/18 2002

## 2018-11-21 ENCOUNTER — Encounter: Payer: Self-pay | Admitting: Internal Medicine

## 2018-11-22 DIAGNOSIS — R3911 Hesitancy of micturition: Secondary | ICD-10-CM | POA: Diagnosis not present

## 2018-11-22 DIAGNOSIS — R310 Gross hematuria: Secondary | ICD-10-CM | POA: Diagnosis not present

## 2018-12-02 ENCOUNTER — Encounter: Payer: Self-pay | Admitting: Family Medicine

## 2018-12-05 ENCOUNTER — Encounter: Payer: Self-pay | Admitting: Family Medicine

## 2018-12-20 ENCOUNTER — Encounter: Payer: Self-pay | Admitting: Internal Medicine

## 2018-12-22 ENCOUNTER — Encounter: Payer: Self-pay | Admitting: Radiation Oncology

## 2018-12-23 ENCOUNTER — Encounter: Payer: Self-pay | Admitting: Internal Medicine

## 2018-12-23 ENCOUNTER — Other Ambulatory Visit: Payer: Self-pay | Admitting: Medical Oncology

## 2018-12-23 DIAGNOSIS — Z91041 Radiographic dye allergy status: Secondary | ICD-10-CM

## 2018-12-23 MED ORDER — DIPHENHYDRAMINE HCL 50 MG PO TABS: 50.0000 mg | ORAL_TABLET | ORAL | 3 refills | Status: DC

## 2018-12-23 MED ORDER — PREDNISONE 50 MG PO TABS: ORAL_TABLET | ORAL | 3 refills | Status: DC

## 2018-12-24 ENCOUNTER — Telehealth: Payer: Self-pay | Admitting: Internal Medicine

## 2018-12-24 NOTE — Telephone Encounter (Signed)
Moved lab to 9:30 on 5/15 for CT per sch msg. Called patient, left msg.

## 2018-12-25 ENCOUNTER — Telehealth: Payer: Self-pay | Admitting: Radiation Oncology

## 2018-12-25 NOTE — Telephone Encounter (Signed)
New Message:     LVM with patient to call back to set up webex for appt on 05/18

## 2018-12-30 ENCOUNTER — Other Ambulatory Visit: Payer: Self-pay | Admitting: Radiation Therapy

## 2018-12-30 ENCOUNTER — Encounter: Payer: Self-pay | Admitting: Podiatry

## 2018-12-30 ENCOUNTER — Other Ambulatory Visit: Payer: Self-pay

## 2018-12-30 ENCOUNTER — Ambulatory Visit: Payer: Medicare Other | Admitting: Podiatry

## 2018-12-30 ENCOUNTER — Telehealth: Payer: Self-pay | Admitting: Radiation Oncology

## 2018-12-30 VITALS — Temp 98.2°F

## 2018-12-30 DIAGNOSIS — Z9229 Personal history of other drug therapy: Secondary | ICD-10-CM | POA: Diagnosis not present

## 2018-12-30 DIAGNOSIS — M79674 Pain in right toe(s): Secondary | ICD-10-CM | POA: Diagnosis not present

## 2018-12-30 DIAGNOSIS — B351 Tinea unguium: Secondary | ICD-10-CM

## 2018-12-30 DIAGNOSIS — M79675 Pain in left toe(s): Secondary | ICD-10-CM | POA: Diagnosis not present

## 2018-12-30 NOTE — Patient Instructions (Signed)

## 2018-12-30 NOTE — Telephone Encounter (Signed)
New message:      LVM on 05/11 for patient to call back to set up webex.

## 2018-12-30 NOTE — Progress Notes (Signed)
Subjective:  [name] presents to clinic with cc of  painful, thick, discolored, elongated toenails 1-5 b/l that become tender and cannot cut because of thickness. He is also on blood thinner, Trental.  He voices no new problems on today's visit.  McGowen, Adrian Blackwater, MD is his PCP.   Current Outpatient Medications:  .  acetaminophen (TYLENOL 8 HOUR) 650 MG CR tablet, Take 1 tablet (650 mg total) by mouth every 6 (six) hours as needed., Disp: 30 tablet, Rfl: 0 .  albuterol (PROVENTIL HFA;VENTOLIN HFA) 108 (90 Base) MCG/ACT inhaler, Inhale 2 puffs into the lungs every 6 (six) hours as needed for wheezing or shortness of breath., Disp: 18 g, Rfl: 1 .  amLODipine (NORVASC) 5 MG tablet, TAKE 1 TABLET BY MOUTH EVERY DAY (Patient taking differently: Take 5 mg by mouth daily. ), Disp: 90 tablet, Rfl: 1 .  aspirin EC 81 MG tablet, Take 81 mg by mouth at bedtime. , Disp: , Rfl:  .  atorvastatin (LIPITOR) 20 MG tablet, Take 1 tablet (20 mg total) by mouth daily., Disp: 90 tablet, Rfl: 1 .  budesonide-formoterol (SYMBICORT) 160-4.5 MCG/ACT inhaler, INHALE 1-2 PUFFS INTO THE LINGS EVERY 12 HOURS. GARGLE AND SPIT AFTER USE. (Patient taking differently: Inhale 1-2 puffs into the lungs 2 (two) times daily. ), Disp: 30.6 Inhaler, Rfl: 1 .  diphenhydrAMINE (BENADRYL) 50 MG tablet, Take 1 tablet (50 mg total) by mouth as directed. Take 1 tablet (50 mg) by mouth 1 hr prior to CTA (scheduled for 11/08/2017), Disp: 1 tablet, Rfl: 3 .  lamoTRIgine (LAMICTAL) 200 MG tablet, TAKE 1 TABLET BY MOUTH IN THE MORNING AND 1.5 TABLETS IN THE EVENING (Patient taking differently: Take 200-300 mg by mouth See admin instructions. TAKE 1 TABLET BY MOUTH IN THE MORNING AND 1.5 TABLETS IN THE EVENING), Disp: 225 tablet, Rfl: 3 .  metoprolol tartrate (LOPRESSOR) 50 MG tablet, TAKE 1 TABLET BY MOUTH TWICE A DAY (Patient taking differently: Take 50 mg by mouth 2 (two) times daily. ), Disp: 180 tablet, Rfl: 0 .  Multiple Vitamin  (MULTIVITAMIN WITH MINERALS) TABS tablet, Take 1 tablet by mouth daily., Disp: , Rfl:  .  Multiple Vitamin (MULTIVITAMIN) tablet, Take 1 tablet by mouth daily. , Disp: , Rfl:  .  pentoxifylline (TRENTAL) 400 MG CR tablet, Take 1 tablet (400 mg total) by mouth 3 (three) times daily., Disp: 90 tablet, Rfl: 5 .  Potassium 99 MG TABS, Take 1 tablet by mouth daily., Disp: , Rfl:  .  predniSONE (DELTASONE) 50 MG tablet, Take one tablet 13 hours, one tablet 7 hours and one tablet 1 hour prior to scan., Disp: 3 tablet, Rfl: 3   Allergies  Allergen Reactions  . Fluvirin [Influenza Vac Split Quad] Nausea And Vomiting and Other (See Comments)    Seizure like activity?  . Quinolones Other (See Comments)    Pt with aneurism: need to avoid quinoline use b/c of weakening of vascular wall that can be associated with quinolones.  . Rosuvastatin Other (See Comments)    Myalgias and dark urine  . Iodinated Diagnostic Agents Hives    1 hive on lt cheek lasting approximately 1 hour on last 2 CT per pt; needs pre meds in future; 50 mg benadryl po 1 hr prior to exam per Dr. Weber Cooks 07/2014  11/2016 per Tery Sanfilippo, pt needs 13hr premeds per our policy.     Objective:  Physical Examination: Neurovascular status intact with palapable pulses, DP and PT b/l.  Capillary refill  time immediate x 10 digits.  No digital hair noted x 10 digits.  Skin temperature gradient WNL b/l LE.  Pedal skin is thin, shiny and atrophic b/l.  Elongated, painful thickened, discolored brittle toenails 1-5 b/l with tenderness to palpation of nailbeds.  No open wounds.  No interdigital macerations.  Muscle strength 5/5 to all LE muscle groups.  Sensation intact with 10 gram monofilament.  Assessment: Mycotic nail infection with pain 1-5 b/l Patient on long term blood thinner  Plan: Debride painful toenails 1-5 b/l with no iatrogenic bleeding. Avoid self trimming due to use of blood thinner. Continue soft, supportive shoe  gear daily. Follow up 3 months. Patient/POA to call should there be a concern in the interim.

## 2019-01-02 ENCOUNTER — Ambulatory Visit
Admission: RE | Admit: 2019-01-02 | Discharge: 2019-01-02 | Disposition: A | Discharge status: Home / Self Care | Payer: Medicare Other | Source: Ambulatory Visit | Attending: Radiation Oncology | Admitting: Radiation Oncology

## 2019-01-02 ENCOUNTER — Other Ambulatory Visit: Payer: Self-pay

## 2019-01-02 DIAGNOSIS — C7949 Secondary malignant neoplasm of other parts of nervous system: Secondary | ICD-10-CM

## 2019-01-02 DIAGNOSIS — C7931 Secondary malignant neoplasm of brain: Secondary | ICD-10-CM

## 2019-01-02 MED ORDER — GADOBENATE DIMEGLUMINE 529 MG/ML IV SOLN
13.0000 mL | Freq: Once | INTRAVENOUS | Status: AC | PRN
  Administered 2019-01-02: 13 mL via INTRAVENOUS

## 2019-01-03 ENCOUNTER — Ambulatory Visit (HOSPITAL_COMMUNITY)
Admission: RE | Admit: 2019-01-03 | Discharge: 2019-01-03 | Disposition: A | Discharge status: Home / Self Care | Payer: Medicare Other | Source: Ambulatory Visit | Attending: Internal Medicine | Admitting: Internal Medicine

## 2019-01-03 ENCOUNTER — Other Ambulatory Visit: Payer: Self-pay

## 2019-01-03 ENCOUNTER — Other Ambulatory Visit: Payer: Self-pay | Admitting: Student

## 2019-01-03 ENCOUNTER — Inpatient Hospital Stay: Payer: Medicare Other | Attending: Internal Medicine

## 2019-01-03 DIAGNOSIS — Z87891 Personal history of nicotine dependence: Secondary | ICD-10-CM | POA: Insufficient documentation

## 2019-01-03 DIAGNOSIS — I10 Essential (primary) hypertension: Secondary | ICD-10-CM | POA: Insufficient documentation

## 2019-01-03 DIAGNOSIS — I252 Old myocardial infarction: Secondary | ICD-10-CM | POA: Insufficient documentation

## 2019-01-03 DIAGNOSIS — Z9221 Personal history of antineoplastic chemotherapy: Secondary | ICD-10-CM | POA: Diagnosis not present

## 2019-01-03 DIAGNOSIS — C3490 Malignant neoplasm of unspecified part of unspecified bronchus or lung: Secondary | ICD-10-CM | POA: Diagnosis not present

## 2019-01-03 DIAGNOSIS — Z79899 Other long term (current) drug therapy: Secondary | ICD-10-CM | POA: Diagnosis not present

## 2019-01-03 DIAGNOSIS — M549 Dorsalgia, unspecified: Secondary | ICD-10-CM | POA: Diagnosis not present

## 2019-01-03 DIAGNOSIS — K5730 Diverticulosis of large intestine without perforation or abscess without bleeding: Secondary | ICD-10-CM | POA: Diagnosis not present

## 2019-01-03 DIAGNOSIS — K8020 Calculus of gallbladder without cholecystitis without obstruction: Secondary | ICD-10-CM | POA: Diagnosis not present

## 2019-01-03 DIAGNOSIS — I2510 Atherosclerotic heart disease of native coronary artery without angina pectoris: Secondary | ICD-10-CM | POA: Diagnosis not present

## 2019-01-03 DIAGNOSIS — Z85118 Personal history of other malignant neoplasm of bronchus and lung: Secondary | ICD-10-CM | POA: Insufficient documentation

## 2019-01-03 DIAGNOSIS — Z7982 Long term (current) use of aspirin: Secondary | ICD-10-CM | POA: Insufficient documentation

## 2019-01-03 DIAGNOSIS — E785 Hyperlipidemia, unspecified: Secondary | ICD-10-CM | POA: Diagnosis not present

## 2019-01-03 DIAGNOSIS — J439 Emphysema, unspecified: Secondary | ICD-10-CM | POA: Diagnosis not present

## 2019-01-03 DIAGNOSIS — N400 Enlarged prostate without lower urinary tract symptoms: Secondary | ICD-10-CM | POA: Insufficient documentation

## 2019-01-03 DIAGNOSIS — Z923 Personal history of irradiation: Secondary | ICD-10-CM | POA: Diagnosis not present

## 2019-01-03 DIAGNOSIS — C7931 Secondary malignant neoplasm of brain: Secondary | ICD-10-CM | POA: Diagnosis not present

## 2019-01-03 DIAGNOSIS — I739 Peripheral vascular disease, unspecified: Secondary | ICD-10-CM | POA: Insufficient documentation

## 2019-01-03 LAB — CMP (CANCER CENTER ONLY)
ALT: 17 U/L (ref 0–44)
AST: 14 U/L — ABNORMAL LOW (ref 15–41)
Albumin: 3.9 g/dL (ref 3.5–5.0)
Alkaline Phosphatase: 74 U/L (ref 38–126)
Anion gap: 10 (ref 5–15)
BUN: 13 mg/dL (ref 8–23)
CO2: 25 mmol/L (ref 22–32)
Calcium: 9.7 mg/dL (ref 8.9–10.3)
Chloride: 106 mmol/L (ref 98–111)
Creatinine: 1.05 mg/dL (ref 0.61–1.24)
GFR, Est AFR Am: >60 mL/min (ref >60)
GFR, Estimated: >60 mL/min (ref >60)
Glucose, Bld: 138 mg/dL — ABNORMAL HIGH (ref 70–99)
Potassium: 4.2 mmol/L (ref 3.5–5.1)
Sodium: 141 mmol/L (ref 135–145)
Total Bilirubin: 0.3 mg/dL (ref 0.3–1.2)
Total Protein: 7.5 g/dL (ref 6.5–8.1)

## 2019-01-03 LAB — CBC WITH DIFFERENTIAL (CANCER CENTER ONLY)
Abs Immature Granulocytes: 0.04 10*3/uL (ref 0.00–0.07)
Basophils Absolute: 0 10*3/uL (ref 0.0–0.1)
Basophils Relative: 0 %
Eosinophils Absolute: 0 10*3/uL (ref 0.0–0.5)
Eosinophils Relative: 0 %
HCT: 47 % (ref 39.0–52.0)
Hemoglobin: 15.3 g/dL (ref 13.0–17.0)
Immature Granulocytes: 0 %
Lymphocytes Relative: 8 %
Lymphs Abs: 0.9 10*3/uL (ref 0.7–4.0)
MCH: 28.4 pg (ref 26.0–34.0)
MCHC: 32.6 g/dL (ref 30.0–36.0)
MCV: 87.4 fL (ref 80.0–100.0)
Monocytes Absolute: 0.1 10*3/uL (ref 0.1–1.0)
Monocytes Relative: 1 %
Neutro Abs: 9.9 10*3/uL — ABNORMAL HIGH (ref 1.7–7.7)
Neutrophils Relative %: 91 %
Platelet Count: 267 10*3/uL (ref 150–400)
RBC: 5.38 MIL/uL (ref 4.22–5.81)
RDW: 14.6 % (ref 11.5–15.5)
WBC Count: 11 10*3/uL — ABNORMAL HIGH (ref 4.0–10.5)
nRBC: 0 % (ref 0.0–0.2)

## 2019-01-03 MED ORDER — SODIUM CHLORIDE (PF) 0.9 % IJ SOLN
INTRAMUSCULAR | Status: AC
  Filled 2019-01-03: qty 50

## 2019-01-03 MED ORDER — PREDNISONE 50 MG PO TABS
50.0000 mg | ORAL_TABLET | Freq: Every day | ORAL | Status: DC
  Administered 2019-01-03: 12:00:00 50 mg via ORAL
  Filled 2019-01-03: qty 1

## 2019-01-03 MED ORDER — IOHEXOL 300 MG/ML  SOLN
100.0000 mL | Freq: Once | INTRAMUSCULAR | Status: AC | PRN
  Administered 2019-01-03: 13:00:00 100 mL via INTRAVENOUS

## 2019-01-06 ENCOUNTER — Encounter: Payer: Self-pay | Admitting: Radiation Oncology

## 2019-01-06 ENCOUNTER — Other Ambulatory Visit: Payer: Medicare Other

## 2019-01-06 ENCOUNTER — Ambulatory Visit
Admission: RE | Admit: 2019-01-06 | Discharge: 2019-01-06 | Disposition: A | Discharge status: Home / Self Care | Payer: Medicare Other | Source: Ambulatory Visit | Attending: Radiation Oncology | Admitting: Radiation Oncology

## 2019-01-06 ENCOUNTER — Ambulatory Visit: Payer: Self-pay | Admitting: Radiation Oncology

## 2019-01-06 ENCOUNTER — Other Ambulatory Visit: Payer: Self-pay

## 2019-01-06 ENCOUNTER — Ambulatory Visit: Payer: Medicare Other | Admitting: Radiation Oncology

## 2019-01-06 VITALS — Ht 63.0 in | Wt 148.0 lb

## 2019-01-06 DIAGNOSIS — Z87891 Personal history of nicotine dependence: Secondary | ICD-10-CM | POA: Diagnosis not present

## 2019-01-06 DIAGNOSIS — Z08 Encounter for follow-up examination after completed treatment for malignant neoplasm: Secondary | ICD-10-CM | POA: Diagnosis not present

## 2019-01-06 DIAGNOSIS — C3412 Malignant neoplasm of upper lobe, left bronchus or lung: Secondary | ICD-10-CM

## 2019-01-06 DIAGNOSIS — C7931 Secondary malignant neoplasm of brain: Secondary | ICD-10-CM | POA: Diagnosis not present

## 2019-01-06 DIAGNOSIS — Z85118 Personal history of other malignant neoplasm of bronchus and lung: Secondary | ICD-10-CM | POA: Diagnosis not present

## 2019-01-06 NOTE — Progress Notes (Signed)
Pt denies pain today. Pt stated he has mild fatigue.
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
## 2019-01-06 NOTE — Addendum Note (Signed)
Encounter addended by: Hayden Pedro, PA-C on: 01/06/2019 1:18 PM  Actions taken: Follow-up modified, Visit diagnoses modified, Clinical Note Signed, LOS modified

## 2019-01-07 ENCOUNTER — Other Ambulatory Visit: Payer: Self-pay | Admitting: Radiation Therapy

## 2019-01-07 DIAGNOSIS — C7949 Secondary malignant neoplasm of other parts of nervous system: Secondary | ICD-10-CM

## 2019-01-07 DIAGNOSIS — C7931 Secondary malignant neoplasm of brain: Secondary | ICD-10-CM

## 2019-01-08 ENCOUNTER — Encounter: Payer: Self-pay | Admitting: Internal Medicine

## 2019-01-08 ENCOUNTER — Other Ambulatory Visit: Payer: Self-pay

## 2019-01-08 ENCOUNTER — Telehealth: Payer: Self-pay | Admitting: Radiation Therapy

## 2019-01-08 ENCOUNTER — Inpatient Hospital Stay (HOSPITAL_BASED_OUTPATIENT_CLINIC_OR_DEPARTMENT_OTHER): Payer: Medicare Other | Admitting: Internal Medicine

## 2019-01-08 VITALS — BP 152/83 | HR 80 | Temp 98.7°F | Resp 20 | Ht 63.0 in | Wt 146.3 lb

## 2019-01-08 DIAGNOSIS — Z87891 Personal history of nicotine dependence: Secondary | ICD-10-CM | POA: Diagnosis not present

## 2019-01-08 DIAGNOSIS — N400 Enlarged prostate without lower urinary tract symptoms: Secondary | ICD-10-CM

## 2019-01-08 DIAGNOSIS — Z923 Personal history of irradiation: Secondary | ICD-10-CM | POA: Diagnosis not present

## 2019-01-08 DIAGNOSIS — Z85118 Personal history of other malignant neoplasm of bronchus and lung: Secondary | ICD-10-CM | POA: Diagnosis not present

## 2019-01-08 DIAGNOSIS — I2510 Atherosclerotic heart disease of native coronary artery without angina pectoris: Secondary | ICD-10-CM

## 2019-01-08 DIAGNOSIS — I739 Peripheral vascular disease, unspecified: Secondary | ICD-10-CM | POA: Diagnosis not present

## 2019-01-08 DIAGNOSIS — C7931 Secondary malignant neoplasm of brain: Secondary | ICD-10-CM

## 2019-01-08 DIAGNOSIS — Z79899 Other long term (current) drug therapy: Secondary | ICD-10-CM

## 2019-01-08 DIAGNOSIS — Z7982 Long term (current) use of aspirin: Secondary | ICD-10-CM | POA: Diagnosis not present

## 2019-01-08 DIAGNOSIS — C3490 Malignant neoplasm of unspecified part of unspecified bronchus or lung: Secondary | ICD-10-CM

## 2019-01-08 DIAGNOSIS — C3412 Malignant neoplasm of upper lobe, left bronchus or lung: Secondary | ICD-10-CM

## 2019-01-08 DIAGNOSIS — Z9221 Personal history of antineoplastic chemotherapy: Secondary | ICD-10-CM

## 2019-01-08 DIAGNOSIS — I252 Old myocardial infarction: Secondary | ICD-10-CM | POA: Diagnosis not present

## 2019-01-08 DIAGNOSIS — M549 Dorsalgia, unspecified: Secondary | ICD-10-CM

## 2019-01-08 DIAGNOSIS — E785 Hyperlipidemia, unspecified: Secondary | ICD-10-CM

## 2019-01-08 DIAGNOSIS — I10 Essential (primary) hypertension: Secondary | ICD-10-CM

## 2019-01-08 NOTE — Progress Notes (Signed)
[facility]  Telephone:([phone] Fax:(336) [phone] OFFICE PROGRESS NOTE  Tammi Sou, MD [address] [place] [address]  DIAGNOSIS: Metastatic non-small cell lung cancer initially diagnosed as Stage IIIA (T2a., N2, M0) non-small cell lung cancer adenocarcinoma with negative EGFR mutation and negative ALK gene translocation involving the left upper lobe middle mediastinal lymphadenopathy diagnosed in August of 2014. He had metastatic disease to the brain diagnosed in December 2016.  Lung cancer   Primary site: Lung (Left)   Staging method: AJCC 7th Edition   Clinical: Stage IIIA (T2a, N2, M0) signed by [name], MD on 06/19/2013  5:06 PM   Summary: Stage IIIA (T2a, N2, M0).  Molecular biomarkers: Positive for [id number]*7, [id number] ATM splice site [id number]>JK TP53 E271K, SETD2 [id number] and SPOP E47K                                          Negative for: RET, ALK, BRAF, KRAS, ERBB2, MET and EGFR.  PRIOR THERAPY:  1) Status post left upper lobectomy with mediastinal lymph node dissection on 05/29/2013. 2) Adjuvant chemotherapy with cisplatin at 75 mg per meter squared and Alimta at 500 mg per meter squared given every 3 weeks for a total of 4 cycles. First cycle given on 07/10/2013 3) curative radiotherapy to the mediastinum under the care of Dr. [name] on 12/12/2013. 4) status post stereotactic radiotherapy to the metastatic brain lesion on 08/25/2015 under the care of Dr. [name] CURRENT THERAPY: Observation.  INTERVAL HISTORY: Jordan REECE89.o. male returns to the clinic today for follow-up visit.  The patient is feeling fine today with no concerning complaints.  He denied having any chest pain, shortness of breath, cough or hemoptysis.  He denied having any recent weight loss or night sweats.  He has no nausea, vomiting, diarrhea or constipation.  He had repeat CT scan of the chest, abdomen and pelvis as well as MRI  of the brain performed recently and he is here for evaluation and discussion of his scan results.  MEDICAL HISTORY: Past Medical History:  Diagnosis Date  . AAA (abdominal aortic aneurysm) (HCC)    s/p repair (aortoiliac bipass; with persistent endograft leak in inferior mesenteric artery)--stable as of 01/2017 vascular f/u.  . Marland Kitchensymptomatic cholelithiasis 07/2015   Incidental finding on PET CT  . Back pain 04/19/2016  . BPH with elevated PSA    PSA signif rise 11/2017 (5.4 in 2015 to 31.21 November 2017. Bx 03/2018 benign.  . Coronary artery disease    MI 1992, S/P  PTCA; negative stress test in November 2011 with no ischemia.   . Diverticulosis   . Elevated PSA 01/2018   01/22/18:  PSA 26.5.  Prostate MRI with abnormality->subsequent prostate bx BENIGN 04/12/18.  Rpt PSA 03/07/18 stable at 24.1-->f/u 1 yr with urologist.  . GrJohney Maineematuria 05/2018   Occurred 2+ mo's after prostate bx: attributed to bx by urol, treated empirically with keflex.  Urol initially suspected gross hem was sequela of recent prost bx. BUT recurrence of gross hem prompted full hematuria w/u 11/15/18.  . Marland Kitchenearing loss    Bilateral   . History of radiation therapy 11/10/13-12/12/13   lung,50Gy/2578f. Hyperlipidemia    Crestor= myalgias  . Hypertension   . [place]   non-small cell;  L upper lobectomy with mediastinal LN  dissection, chemo, and radiation in 2014/2015. Remission until pt had questionable seizure 07/2015--MRI showed brain mets; palliative brain rad (stereotactic radiation therapy) started 08/25/15.  CT C/A/P clear 02/2016, 05/2016, and 11/2016.  Plan per onc is to repeat in 6 mo.  . Malignant neoplasm of upper lobe, left bronchus or lung ([place]) 10/29/2013   ?New spiculated 4.5cm mass in L upper lung field on CXR at [facility] in [place] 07/14/17.  Dr. Julien Nordmann (onc) recommended f/u CXR after abx course.  If mass unchanged then repeat CT chest.  Surveillance CT chest and MRI brain 06/2018  show no sign of dz.  . Malignant neoplasm of upper lobe, left bronchus or lung ([place]) 06/26/2013  . Myocardial infarction [facility]) 1992   Dr Angelena Form    . Peripheral vascular disease ([place]) 08/2012   4.8x4.6 cm infrarenal abdominal aortic fusiform aneurysm, 1.5 cm right common iliac artery aneurysm.  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  Marland Kitchen Pneumonia 09/06/2018   XRAY was done at [place] care.  . S/P radiation therapy [facility] 08/25/15   Stereotactic radiation therapy: frontoparietal 18gy,posterior frontal lobe 20gy,  . Seizure disorder ([place]) 2016/2017   as sequela of brain mets;  Grand mal seizure 07/2015, then got on keppra and was seizure-free until focal motor seizures of L side of face began 02/2016- these responded well to up-titration of keppra but pt had adverse side effects so eventually pt had to be switched over to lamictal 07/2016--stable/seizure free on this med as of 09/2018 neuro f/u.  Marland Kitchen Shortness of breath   . Tobacco dependence    "Quit" 1992, but pt has smoked "on and off" since that time    ALLERGIES:  is allergic to fluvirin [influenza vac split quad]; quinolones; rosuvastatin; and iodinated diagnostic agents.  MEDICATIONS:  Current Outpatient Medications  Medication Sig Dispense Refill  . acetaminophen (TYLENOL 8 HOUR) 650 MG CR tablet Take 1 tablet (650 mg total) by mouth every 6 (six) hours as needed. (Patient not taking: Reported on 01/06/2019) 30 tablet 0  . albuterol (PROVENTIL HFA;VENTOLIN HFA) 108 (90 Base) MCG/ACT inhaler Inhale 2 puffs into the lungs every 6 (six) hours as needed for wheezing or shortness of breath. 18 g 1  . amLODipine (NORVASC) 5 MG tablet TAKE 1 TABLET BY MOUTH EVERY DAY (Patient taking differently: Take 5 mg by mouth daily. ) 90 tablet 1  . aspirin EC 81 MG tablet Take 81 mg by mouth at bedtime.     Marland Kitchen atorvastatin (LIPITOR) 20 MG tablet Take 1  tablet (20 mg total) by mouth daily. 90 tablet 1  . budesonide-formoterol (SYMBICORT) 160-4.5 MCG/ACT inhaler INHALE 1-2 PUFFS INTO THE LINGS EVERY 12 HOURS. GARGLE AND SPIT AFTER USE. (Patient taking differently: Inhale 1-2 puffs into the lungs 2 (two) times daily. ) 30.6 Inhaler 1  . diphenhydrAMINE (BENADRYL) 50 MG tablet Take 1 tablet (50 mg total) by mouth as directed. Take 1 tablet (50 mg) by mouth 1 hr prior to CTA (scheduled for 11/08/2017) 1 tablet 3  . lamoTRIgine (LAMICTAL) 200 MG tablet TAKE 1 TABLET BY MOUTH IN THE MORNING AND 1.5 TABLETS IN THE EVENING (Patient taking differently: Take 200-300 mg by mouth See admin instructions. TAKE 1 TABLET BY MOUTH IN THE MORNING AND 1.5 TABLETS IN THE EVENING) 225 tablet 3  . metoprolol tartrate (LOPRESSOR) 50 MG tablet TAKE 1 TABLET BY MOUTH TWICE A DAY (  Patient taking differently: Take 50 mg by mouth 2 (two) times daily. ) 180 tablet 0  . Multiple Vitamin (MULTIVITAMIN WITH MINERALS) TABS tablet Take 1 tablet by mouth daily.    . Multiple Vitamin (MULTIVITAMIN) tablet Take 1 tablet by mouth daily.     . pentoxifylline (TRENTAL) 400 MG CR tablet Take 1 tablet (400 mg total) by mouth 3 (three) times daily. 90 tablet 5  . Potassium 99 MG TABS Take 1 tablet by mouth daily.    . predniSONE (DELTASONE) 50 MG tablet Take one tablet 13 hours, one tablet 7 hours and one tablet 1 hour prior to scan. 3 tablet 3   No current facility-administered medications for this visit.     SURGICAL HISTORY:  Past Surgical History:  Procedure Laterality Date  . ABDOMINAL AORTIC ENDOVASCULAR STENT GRAFT N/A 05/07/2014   Procedure: ABDOMINAL AORTIC ENDOVASCULAR STENT GRAFT;  Surgeon: Serafina Mitchell, MD;  Location: [facility];  Service: Vascular;  Laterality: N/A;  . Carotid duplex dopplers  07/2015   1-39% on R, no signif dz noted on L  . COLONOSCOPY W/ POLYPECTOMY  2003    negative 2010,due 2020; Dr Olevia Perches  . CORONARY ANGIOPLASTY     no stents  .  CYSTOSCOPY/RETROGRADE/URETEROSCOPY Bilateral 10/09/2012   Procedure: BILATERAL RETROGRADE bladder and urethral BIOPSY ;  Surgeon: Molli Hazard, MD;  Location: [facility];  Service: Urology;  Laterality: Bilateral;  BILATERAL RETROGRADE   . EEG  08/05/15   Pt placed on keppra just prior to this test due to having ? seizure (MRI showed brain mets)  . HERNIA REPAIR Bilateral    Inguinal  . INGUINAL HERIIORRHAPHY BILATERALLY    . IR ANGIOGRAM SELECTIVE EACH ADDITIONAL VESSEL  12/03/2017  . IR ANGIOGRAM VISCERAL SELECTIVE  12/03/2017  . IR ANGIOGRAM VISCERAL SELECTIVE  12/03/2017  . IR EMBO ARTERIAL NOT HEMORR HEMANG INC GUIDE ROADMAPPING  12/03/2017  . IR GENERIC HISTORICAL  07/20/2016   IR RADIOLOGIST EVAL & MGMT 07/20/2016 [facility] INTERV RAD  . IR GENERIC HISTORICAL  08/02/2016   IR RADIOLOGIST EVAL & MGMT 08/02/2016 Sandi Mariscal, MD [facility] INTERV RAD  . IR RADIOLOGIST EVAL & MGMT  11/08/2017  . IR RADIOLOGIST EVAL & MGMT  01/01/2018  . IR US GUIDE VASC ACCESS RIGHT  12/03/2017  . MEDIASTINOSCOPY N/A 05/01/2013   Procedure: MEDIASTINOSCOPY;  Surgeon: Gaye Pollack, MD;  Location: [facility] OR;  Service: Thoracic;  Laterality: N/A;  . PFTs  04/2013   Minimal obstructive airway disease  . PILONIDAL CYST EXCISION    . PROSTATE BIOPSY N/A 10/09/2012   NEG bx 04/12/18 as well.  Procedure: PROSTATIC URETHRAL BIOPSY--BPH--no evidence of malignancy;  Surgeon: Molli Hazard, MD;  Location: [facility];  Service: Urology;  Laterality: N/A;  PROSTATIC URETHRAL BIOPSY  . PROSTATE BIOPSY  03/2018   NEG  . PTCA  1992  . ROTATOR CUFF REPAIR     Bilateral  . THOROCOTOMY WITH LOBECTOMY Left 05/29/2013   Procedure: LEFT THOROCOTOMY WITH LEFT UPPER LOBE LOBECTOMY;  Surgeon: Gaye Pollack, MD;  Location: [facility];  Service: Thoracic;  Laterality: Left;  Marland Kitchen VIDEO BRONCHOSCOPY N/A 05/01/2013   Procedure: VIDEO BRONCHOSCOPY;  Surgeon: Gaye Pollack, MD;  Location: [facility] OR;  Service: Thoracic;  Laterality: N/A;    REVIEW OF  SYSTEMS:  A comprehensive review of systems was negative.   PHYSICAL EXAMINATION: General appearance: alert, cooperative and no distress Head: Normocephalic, without obvious abnormality, atraumatic Neck: no adenopathy, no JVD, supple,  symmetrical, trachea midline and thyroid not enlarged, symmetric, no tenderness/mass/nodules Lymph nodes: Cervical, supraclavicular, and axillary nodes normal. Resp: clear to auscultation bilaterally Back: symmetric, no curvature. ROM normal. No CVA tenderness. Cardio: regular rate and rhythm, S1, S2 normal, no murmur, click, rub or gallop GI: soft, non-tender; bowel sounds normal; no masses,  no organomegaly Extremities: extremities normal, atraumatic, no cyanosis or edema  ECOG PERFORMANCE STATUS: 1 - Symptomatic but completely ambulatory  Blood pressure (!) 152/83, pulse 80, temperature 98.7 F (37.1 C), temperature source Oral, resp. rate 20, height 5' 3" (1.6 m), weight 146 lb 4.8 oz (66.4 kg), SpO2 97 %.  LABORATORY DATA: Lab Results  Component Value Date   WBC 11.0 (H) 01/03/2019   HGB 15.3 01/03/2019   HCT 47.0 01/03/2019   MCV 87.4 01/03/2019   PLT 267 01/03/2019      Chemistry      Component Value Date/Time   NA 141 01/03/2019 0930   NA 141 06/18/2017 1006   K 4.2 01/03/2019 0930   K 5.3 (H) 06/18/2017 1006   CL 106 01/03/2019 0930   CO2 25 01/03/2019 0930   CO2 25 06/18/2017 1006   BUN 13 01/03/2019 0930   BUN 14.3 06/18/2017 1006   CREATININE 1.05 01/03/2019 0930   CREATININE 0.9 06/18/2017 1006      Component Value Date/Time   CALCIUM 9.7 01/03/2019 0930   CALCIUM 10.3 06/18/2017 1006   ALKPHOS 74 01/03/2019 0930   ALKPHOS 63 06/18/2017 1006   AST 14 (L) 01/03/2019 0930   AST 15 06/18/2017 1006   ALT 17 01/03/2019 0930   ALT 17 06/18/2017 1006   BILITOT 0.3 01/03/2019 0930   BILITOT 0.35 06/18/2017 1006       RADIOGRAPHIC STUDIES: Ct Chest W Contrast  Result Date: 01/04/2019 CLINICAL DATA:  Lung cancer with brain  metastases. EXAM: CT CHEST, ABDOMEN, AND PELVIS WITH CONTRAST TECHNIQUE: Multidetector CT imaging of the chest, abdomen and pelvis was performed following the standard protocol during bolus administration of intravenous contrast. CONTRAST:  155m OMNIPAQUE IOHEXOL 300 MG/ML  SOLN COMPARISON:  CT abdomen/pelvis dated 11/22/2018. CT chest dated 07/02/2018. FINDINGS: CT CHEST FINDINGS Cardiovascular: The heart is normal in size. Small pericardial effusion. No evidence thoracic aortic aneurysm. Atherosclerotic calcifications of the aortic arch. Mild coronary atherosclerosis in the LAD and right coronary artery. Mediastinum/Nodes: No suspicious mediastinal lymphadenopathy. Visualized thyroid is unremarkable. Lungs/Pleura: Status post left upper lobectomy with associated volume loss. Stable radiation changes in the left perihilar region. Mild paraseptal emphysematous changes in the right upper lobe. No suspicious pulmonary nodules. No focal consolidation or pleural effusion. Musculoskeletal: Mild degenerative changes of the lower thoracic spine. CT ABDOMEN PELVIS FINDINGS Hepatobiliary: Liver is within normal limits. No suspicious/enhancing hepatic lesions. Cholelithiasis, without associated inflammatory changes. Pancreas: Within normal limits. Spleen: Within normal limits. Adrenals/Urinary Tract: Adrenal glands are within normal limits. Right lower pole renal cortical scarring/atrophy (series 2/image 35). Left renal cysts and renal sinus cysts, unchanged from recent CT. No hydronephrosis. Thick-walled bladder, although underdistended. Stomach/Bowel: Stomach is within normal limits. No evidence of bowel obstruction. Normal appendix (series 2/image 82). Mild left colonic diverticulosis, without evidence of diverticulitis. Vascular/Lymphatic: 6.0 x 5.2 cm infrarenal abdominal aortic aneurysm with indwelling aorto bi-iliac stent. Study was not tailored for evaluation of possible endoleak. Atherosclerotic calcifications of  the abdominal aorta and branch vessels. No suspicious abdominopelvic lymphadenopathy. Reproductive: Prostatomegaly, with enlargement the central gland, indenting the base of the bladder. Other: No abdominopelvic ascites. Tiny fat containing bilateral  inguinal hernias. Musculoskeletal: Mild moderate superior endplate compression fracture deformity at L4. Mild degenerative changes of the lumbar spine. IMPRESSION: Status post left upper lobectomy with radiation changes in the left hemithorax. No evidence of recurrent or metastatic disease in the chest, abdomen, or pelvis. 6.0 cm infrarenal abdominal aortic aneurysm with indwelling aorto bi-iliac stent. Additional stable ancillary findings in the abdomen/pelvis, unchanged from recent CT. Electronically Signed   By: Julian Hy M.D.   On: 01/04/2019 06:49   Mr Jeri Cos ZO Contrast  Result Date: 01/02/2019 CLINICAL DATA:  Treated brain metastases.  Lung cancer. Creatinine was obtained on site at [facility] at [address] Results: Creatinine 0.9 mg/dL. EXAM: MRI HEAD WITHOUT AND WITH CONTRAST TECHNIQUE: Multiplanar, multiecho pulse sequences of the brain and surrounding structures were obtained without and with intravenous contrast. CONTRAST:  81m MULTIHANCE GADOBENATE DIMEGLUMINE 529 MG/ML IV SOLN COMPARISON:  Brain MRI 07/04/2018, 12/20/2017 FINDINGS: BRAIN: There is no acute infarct, acute hemorrhage or extra-axial collection. The midline structures are normal. Unchanged hyperintense T2-weighted signal at the site of treated lesions in the right frontal and parietal lobes. Numerous other foci of white matter hyperintense T2-weighted signal. The cerebral and cerebellar volume are age-appropriate. No hydrocephalus. Susceptibility weighted imaging shows chronic microhemorrhage within both occipital lobes and hemosiderin deposition at the treated right frontal and parietal lesion sites. There are no new contrast-enhancing lesions within the brain.  Peripherally enhancing lesion in the right parietal lobe (series 11, image 119) is unchanged in size measuring 17 x 9 mm. Moderate surrounding hyperintense T2-weighted signal is unchanged. Small lesion at the base of the right precentral gyrus is also unchanged (series 11, image 141). Mild surrounding hyperintense T2-weighted signal is unchanged. VASCULAR: The major intracranial arterial and venous sinus flow voids are normal. SKULL AND UPPER CERVICAL SPINE: Calvarial bone marrow signal is normal. There is no skull base mass. Visualized upper cervical spine and soft tissues are normal. SINUSES/ORBITS: No fluid levels or advanced mucosal thickening. No mastoid or middle ear effusion. The orbits are normal. IMPRESSION: 1. Unchanged appearance of the two treated lesions in the right frontal and parietal lobes. 2. No new metastatic lesions. Electronically Signed   By: KUlyses JarredM.D.   On: 01/02/2019 23:31   Ct Abdomen Pelvis W Contrast  Result Date: 01/04/2019 CLINICAL DATA:  Lung cancer with brain metastases. EXAM: CT CHEST, ABDOMEN, AND PELVIS WITH CONTRAST TECHNIQUE: Multidetector CT imaging of the chest, abdomen and pelvis was performed following the standard protocol during bolus administration of intravenous contrast. CONTRAST:  1075mOMNIPAQUE IOHEXOL 300 MG/ML  SOLN COMPARISON:  CT abdomen/pelvis dated 11/22/2018. CT chest dated 07/02/2018. FINDINGS: CT CHEST FINDINGS Cardiovascular: The heart is normal in size. Small pericardial effusion. No evidence thoracic aortic aneurysm. Atherosclerotic calcifications of the aortic arch. Mild coronary atherosclerosis in the LAD and right coronary artery. Mediastinum/Nodes: No suspicious mediastinal lymphadenopathy. Visualized thyroid is unremarkable. Lungs/Pleura: Status post left upper lobectomy with associated volume loss. Stable radiation changes in the left perihilar region. Mild paraseptal emphysematous changes in the right upper lobe. No suspicious pulmonary  nodules. No focal consolidation or pleural effusion. Musculoskeletal: Mild degenerative changes of the lower thoracic spine. CT ABDOMEN PELVIS FINDINGS Hepatobiliary: Liver is within normal limits. No suspicious/enhancing hepatic lesions. Cholelithiasis, without associated inflammatory changes. Pancreas: Within normal limits. Spleen: Within normal limits. Adrenals/Urinary Tract: Adrenal glands are within normal limits. Right lower pole renal cortical scarring/atrophy (series 2/image 35). Left renal cysts and renal sinus cysts, unchanged from recent CT.  No hydronephrosis. Thick-walled bladder, although underdistended. Stomach/Bowel: Stomach is within normal limits. No evidence of bowel obstruction. Normal appendix (series 2/image 82). Mild left colonic diverticulosis, without evidence of diverticulitis. Vascular/Lymphatic: 6.0 x 5.2 cm infrarenal abdominal aortic aneurysm with indwelling aorto bi-iliac stent. Study was not tailored for evaluation of possible endoleak. Atherosclerotic calcifications of the abdominal aorta and branch vessels. No suspicious abdominopelvic lymphadenopathy. Reproductive: Prostatomegaly, with enlargement the central gland, indenting the base of the bladder. Other: No abdominopelvic ascites. Tiny fat containing bilateral inguinal hernias. Musculoskeletal: Mild moderate superior endplate compression fracture deformity at L4. Mild degenerative changes of the lumbar spine. IMPRESSION: Status post left upper lobectomy with radiation changes in the left hemithorax. No evidence of recurrent or metastatic disease in the chest, abdomen, or pelvis. 6.0 cm infrarenal abdominal aortic aneurysm with indwelling aorto bi-iliac stent. Additional stable ancillary findings in the abdomen/pelvis, unchanged from recent CT. Electronically Signed   By: Julian Hy M.D.   On: 01/04/2019 06:49   ASSESSMENT/PLAN:  This is a very pleasant 78 years old white male with metastatic non-small cell lung cancer  that was initially diagnosed as a stage IIIa adenocarcinoma status post left upper lobectomy with mediastinal lymph node dissection followed by adjuvant systemic chemotherapy with cisplatin and Alimta. The patient also received stereotactic radiotherapy to metastatic brain lesions in January 2017. The patient has been on observation and he is feeling fine. Repeat CT scan of the chest, abdomen and pelvis with no concerning findings for disease recurrence or progression. MRI of the brain showed a stable disease. I discussed the scan results with the patient today.  I recommended for him to continue on observation with repeat CT scan of the chest in 6 months. He was advised to call immediately if he has any concerning symptoms in the interval. All questions were answered. The patient knows to call the clinic with any problems, questions or concerns. We can certainly see the patient much sooner if necessary. I spent 10 minutes counseling the patient face to face. The total time spent in the appointment was 15 minutes.  Disclaimer: This note was dictated with voice recognition software. Similar sounding words can inadvertently be transcribed and may not be corrected upon review.

## 2019-01-08 NOTE — Telephone Encounter (Signed)
Left a detailed voicemail on his home phone about his November brain MRI and follow-up with Bryson Ha. My contact information was included in case he has a question or conflict with the given times/dates.  Mont Dutton R.T.(R)(T) Special Procedures Navigator

## 2019-01-10 ENCOUNTER — Telehealth: Payer: Self-pay | Admitting: Internal Medicine

## 2019-01-10 ENCOUNTER — Encounter: Payer: Self-pay | Admitting: Family Medicine

## 2019-01-10 NOTE — Telephone Encounter (Signed)
Scheduled appt per 5/20 los -

## 2019-01-11 ENCOUNTER — Other Ambulatory Visit: Payer: Self-pay | Admitting: Family Medicine

## 2019-01-17 DIAGNOSIS — H2511 Age-related nuclear cataract, right eye: Secondary | ICD-10-CM | POA: Diagnosis not present

## 2019-01-17 DIAGNOSIS — H2513 Age-related nuclear cataract, bilateral: Secondary | ICD-10-CM | POA: Diagnosis not present

## 2019-01-23 ENCOUNTER — Encounter: Payer: Self-pay | Admitting: Family Medicine

## 2019-01-24 ENCOUNTER — Other Ambulatory Visit: Payer: Self-pay

## 2019-01-24 MED ORDER — BUDESONIDE-FORMOTEROL FUMARATE 160-4.5 MCG/ACT IN AERO: INHALATION_SPRAY | RESPIRATORY_TRACT | 1 refills | Status: DC

## 2019-02-17 ENCOUNTER — Encounter: Payer: Self-pay | Admitting: Family Medicine

## 2019-02-17 ENCOUNTER — Other Ambulatory Visit: Payer: Self-pay

## 2019-02-17 MED ORDER — VARENICLINE TARTRATE 1 MG PO TABS: 1.0000 mg | ORAL_TABLET | Freq: Two times a day (BID) | ORAL | 1 refills | Status: DC

## 2019-02-17 MED ORDER — CHANTIX STARTING MONTH PAK 0.5 MG X 11 & 1 MG X 42 PO TABS: ORAL_TABLET | ORAL | 0 refills | Status: DC

## 2019-02-17 NOTE — Telephone Encounter (Signed)
Britt, pls fax the chantix starter pack rx that I put on your desk.-thx

## 2019-02-18 ENCOUNTER — Telehealth: Payer: Self-pay

## 2019-02-18 NOTE — Telephone Encounter (Signed)
Called pharmacy and confirmed Rx was received. Pharmacist Roderic Palau confirmed it was received and patient picked up today.

## 2019-02-18 NOTE — Telephone Encounter (Signed)
Spoke with Roderic Palau at [facility] and confirmed Rx was received and pt picked up rx today.

## 2019-02-25 DIAGNOSIS — Z961 Presence of intraocular lens: Secondary | ICD-10-CM | POA: Diagnosis not present

## 2019-02-27 ENCOUNTER — Other Ambulatory Visit: Payer: Self-pay

## 2019-02-27 MED ORDER — AMLODIPINE BESYLATE 5 MG PO TABS: 5.0000 mg | ORAL_TABLET | Freq: Every day | ORAL | 1 refills | Status: DC

## 2019-03-08 ENCOUNTER — Other Ambulatory Visit: Payer: Self-pay | Admitting: Neurology

## 2019-03-10 ENCOUNTER — Telehealth: Payer: Self-pay | Admitting: Podiatry

## 2019-03-10 NOTE — Telephone Encounter (Signed)
Pt sent MyChart message requesting to reschedule his appt with Dr.Galaway for a sooner date. Called patient and left a message with his wife to have him call back to reschedule.

## 2019-04-02 ENCOUNTER — Other Ambulatory Visit: Payer: Self-pay

## 2019-04-02 ENCOUNTER — Encounter: Payer: Self-pay | Admitting: Podiatry

## 2019-04-02 ENCOUNTER — Ambulatory Visit: Payer: Medicare Other | Admitting: Podiatry

## 2019-04-02 VITALS — Temp 97.3°F

## 2019-04-02 DIAGNOSIS — M79675 Pain in left toe(s): Secondary | ICD-10-CM

## 2019-04-02 DIAGNOSIS — Z9229 Personal history of other drug therapy: Secondary | ICD-10-CM

## 2019-04-02 DIAGNOSIS — B351 Tinea unguium: Secondary | ICD-10-CM

## 2019-04-02 DIAGNOSIS — M79674 Pain in right toe(s): Secondary | ICD-10-CM

## 2019-04-02 NOTE — Patient Instructions (Signed)

## 2019-04-11 ENCOUNTER — Other Ambulatory Visit: Payer: Self-pay | Admitting: Family Medicine

## 2019-04-13 NOTE — Progress Notes (Signed)
Subjective:  Jordan Mccullough presents to clinic today with cc of  painful, thick, discolored, elongated toenails 1-5 b/l that become tender and cannot cut because of thickness.Pain is aggravated when wearing enclosed shoe gear.  He continues to take Trental. He voices no new pedal concerns on today's visit.    Current Outpatient Medications:  .  acetaminophen (TYLENOL 8 HOUR) 650 MG CR tablet, Take 1 tablet (650 mg total) by mouth every 6 (six) hours as needed. (Patient not taking: Reported on 01/06/2019), Disp: 30 tablet, Rfl: 0 .  albuterol (PROVENTIL HFA;VENTOLIN HFA) 108 (90 Base) MCG/ACT inhaler, Inhale 2 puffs into the lungs every 6 (six) hours as needed for wheezing or shortness of breath., Disp: 18 g, Rfl: 1 .  amLODipine (NORVASC) 5 MG tablet, Take 1 tablet (5 mg total) by mouth daily. OFFICE VISIT NEEDED FOR FURTHER REFILLS., Disp: 90 tablet, Rfl: 1 .  aspirin EC 81 MG tablet, Take 81 mg by mouth at bedtime. , Disp: , Rfl:  .  atorvastatin (LIPITOR) 20 MG tablet, Take 1 tablet (20 mg total) by mouth daily., Disp: 90 tablet, Rfl: 1 .  BESIVANCE 0.6 % SUSP, APPLY 1 DROP INTO RIGHT EYE THREE TIMES A DAY AS DIRECTED, Disp: , Rfl:  .  budesonide-formoterol (SYMBICORT) 160-4.5 MCG/ACT inhaler, INHALE 1-2 PUFFS INTO THE LINGS EVERY 12 HOURS. GARGLE AND SPIT AFTER USE., Disp: 30.6 Inhaler, Rfl: 1 .  diphenhydrAMINE (BENADRYL) 50 MG tablet, Take 1 tablet (50 mg total) by mouth as directed. Take 1 tablet (50 mg) by mouth 1 hr prior to CTA (scheduled for 11/08/2017), Disp: 1 tablet, Rfl: 3 .  DUREZOL 0.05 % EMUL, INSTILL 1 DROP INTO RIGHT EYE 3 TIMES A DAY AS DIRECTED, Disp: , Rfl:  .  lamoTRIgine (LAMICTAL) 200 MG tablet, TAKE 1 TABLET BY MOUTH IN THE MORNING AND 1.5 TABLETS IN THE EVENING, Disp: 216 tablet, Rfl: 1 .  metoprolol tartrate (LOPRESSOR) 50 MG tablet, Take 1 tablet (50 mg total) by mouth 2 (two) times daily., Disp: 180 tablet, Rfl: 0 .  Multiple Vitamin (MULTIVITAMIN WITH MINERALS) TABS  tablet, Take 1 tablet by mouth daily., Disp: , Rfl:  .  Multiple Vitamin (MULTIVITAMIN) tablet, Take 1 tablet by mouth daily. , Disp: , Rfl:  .  pentoxifylline (TRENTAL) 400 MG CR tablet, Take 1 tablet (400 mg total) by mouth 3 (three) times daily., Disp: 90 tablet, Rfl: 5 .  Potassium 99 MG TABS, Take 1 tablet by mouth daily., Disp: , Rfl:  .  predniSONE (DELTASONE) 50 MG tablet, Take one tablet 13 hours, one tablet 7 hours and one tablet 1 hour prior to scan., Disp: 3 tablet, Rfl: 3 .  SYMBICORT 160-4.5 MCG/ACT inhaler, , Disp: , Rfl:  .  tamsulosin (FLOMAX) 0.4 MG CAPS capsule, Take 0.4 mg by mouth daily., Disp: , Rfl:  .  varenicline (CHANTIX CONTINUING MONTH PAK) 1 MG tablet, Take 1 tablet (1 mg total) by mouth 2 (two) times daily., Disp: 60 tablet, Rfl: 1 .  varenicline (CHANTIX STARTING MONTH PAK) 0.5 MG X 11 & 1 MG X 42 tablet, Take one 0.5 mg tablet by mouth once daily for 3 days, then increase to one 0.5 mg tablet twice daily for 4 days, then increase to one 1 mg tablet twice daily., Disp: 53 tablet, Rfl: 0   Allergies  Allergen Reactions  . Fluvirin [Influenza Vac Split Quad] Nausea And Vomiting and Other (See Comments)    Seizure like activity?  . Quinolones Other (  See Comments)    Pt with aneurism: need to avoid quinoline use b/c of weakening of vascular wall that can be associated with quinolones.  . Rosuvastatin Other (See Comments)    Myalgias and dark urine  . Iodinated Diagnostic Agents Hives    1 hive on lt cheek lasting approximately 1 hour on last 2 CT per pt; needs pre meds in future; 50 mg benadryl po 1 hr prior to exam per Dr. Weber Cooks 07/2014  11/2016 per Tery Sanfilippo, pt needs 13hr premeds per our policy.     Objective: Vitals:   04/02/19 1049  Temp: (!) 97.3 F (36.3 C)    Physical Examination:  Vascular Examination: Capillary refill time immediate x 10 digits.  Palpable DP/PT pulses b/l.  Digital hair present b/l.  No edema noted b/l.  Skin temperature  gradient WNL b/l.  Dermatological Examination: Skin is thin, shiny and atrophic b/l.  No open wounds b/l.  No interdigital macerations noted b/l.  Elongated, thick, discolored brittle toenails with subungual debris and pain on dorsal palpation of nailbeds 1-5 b/l.  Musculoskeletal Examination: Muscle strength 5/5 to all muscle groups b/l  No pain, crepitus or joint discomfort with active/passive ROM.  Neurological Examination: Sensation intact 5/5 b/l with 10 gram monofilament. Vibratory sensation intact b/l.  Proprioceptive sensation intact b/l.  Assessment: Mycotic nail infection with pain 1-5 b/l Patient on blood thinner  Plan: 1. Toenails 1-5 b/l were debrided in length and girth without iatrogenic laceration. 2.  Continue soft, supportive shoe gear daily. 3.  Report any pedal injuries to medical professional. 4.  Follow up 9 weeks. 5.  Patient/POA to call should there be a question/concern in there interim.

## 2019-04-14 ENCOUNTER — Other Ambulatory Visit: Payer: Self-pay

## 2019-04-14 MED ORDER — METOPROLOL TARTRATE 50 MG PO TABS: 50.0000 mg | ORAL_TABLET | Freq: Two times a day (BID) | ORAL | 0 refills | Status: DC

## 2019-05-08 ENCOUNTER — Other Ambulatory Visit: Payer: Self-pay | Admitting: Neurology

## 2019-05-09 ENCOUNTER — Other Ambulatory Visit: Payer: Self-pay

## 2019-05-09 ENCOUNTER — Ambulatory Visit (INDEPENDENT_AMBULATORY_CARE_PROVIDER_SITE_OTHER): Payer: Medicare Other | Admitting: Family Medicine

## 2019-05-09 ENCOUNTER — Encounter: Payer: Self-pay | Admitting: Family Medicine

## 2019-05-09 VITALS — BP 138/69 | HR 80 | Temp 98.3°F | Resp 16 | Ht 63.0 in | Wt 140.0 lb

## 2019-05-09 DIAGNOSIS — Z23 Encounter for immunization: Secondary | ICD-10-CM | POA: Diagnosis not present

## 2019-05-09 DIAGNOSIS — E7800 Pure hypercholesterolemia, unspecified: Secondary | ICD-10-CM | POA: Diagnosis not present

## 2019-05-09 DIAGNOSIS — F17200 Nicotine dependence, unspecified, uncomplicated: Secondary | ICD-10-CM

## 2019-05-09 DIAGNOSIS — I10 Essential (primary) hypertension: Secondary | ICD-10-CM | POA: Diagnosis not present

## 2019-05-09 LAB — COMPREHENSIVE METABOLIC PANEL
ALT: 13 U/L (ref 0–53)
AST: 14 U/L (ref 0–37)
Albumin: 4.3 g/dL (ref 3.5–5.2)
Alkaline Phosphatase: 67 U/L (ref 39–117)
BUN: 16 mg/dL (ref 6–23)
CO2: 25 mEq/L (ref 19–32)
Calcium: 9.8 mg/dL (ref 8.4–10.5)
Chloride: 104 mEq/L (ref 96–112)
Creatinine, Ser: 0.96 mg/dL (ref 0.40–1.50)
GFR: 75.9 mL/min (ref >60.00)
Glucose, Bld: 90 mg/dL (ref 70–99)
Potassium: 4.1 mEq/L (ref 3.5–5.1)
Sodium: 139 mEq/L (ref 135–145)
Total Bilirubin: 0.4 mg/dL (ref 0.2–1.2)
Total Protein: 6.8 g/dL (ref 6.0–8.3)

## 2019-05-09 LAB — LIPID PANEL
Cholesterol: 133 mg/dL (ref 0–200)
HDL: 44.1 mg/dL (ref >39.00)
LDL Cholesterol: 70 mg/dL (ref 0–99)
NonHDL: 88.78
Total CHOL/HDL Ratio: 3
Triglycerides: 92 mg/dL (ref 0.0–149.0)
VLDL: 18.4 mg/dL (ref 0.0–40.0)

## 2019-05-09 MED ORDER — ATORVASTATIN CALCIUM 20 MG PO TABS: 20.0000 mg | ORAL_TABLET | Freq: Every day | ORAL | 3 refills | Status: DC

## 2019-05-09 MED ORDER — BUPROPION HCL ER (XL) 150 MG PO TB24: 150.0000 mg | ORAL_TABLET | Freq: Every day | ORAL | 0 refills | Status: DC

## 2019-05-09 NOTE — Progress Notes (Signed)
OFFICE VISIT  05/09/2019   CC:  Chief Complaint  Patient presents with  . Follow-up    needs medication refill. wants flu shot   HPI:    Patient is a 78 y.o. Caucasian male who presents accompanied by his wife for f/u HTN and tobacco dependence.  Feeling good. He is flying with another pilot lately. Goes for a walk, occ swimming. He does not check his bp at home.  Takes amlod and metoprolol as rx'd.  Still smoking about 1/2 pack per day cigs.  He most recently tried chantix but failed to quit. He admits he does not have full commitment to quit smoking. He failed nicotine replacement.  In fact, had rash to the patch.   He is now on annual surveillance imaging regarding his metastatic lung cancer.  Past Medical History:  Diagnosis Date  . AAA (abdominal aortic aneurysm) (HCC)    s/p repair (aortoiliac bipass; with persistent endograft leak in inferior mesenteric artery)--stable as of 01/2017 vascular f/u.  Marland Kitchen Asymptomatic cholelithiasis 07/2015   Incidental finding on PET CT  . Back pain 04/19/2016  . BPH with elevated PSA    PSA signif rise 11/2017 (5.4 in 2015 to 31.21 November 2017. Bx 03/2018 benign.  . Coronary artery disease    MI 1992, S/P  PTCA; negative stress test in November 2011 with no ischemia.   . Diverticulosis   . Elevated PSA 01/2018   01/22/18:  PSA 26.5.  Prostate MRI with abnormality->subsequent prostate bx BENIGN 04/12/18.  Rpt PSA 03/07/18 stable at 24.1-->f/u 1 yr with urologist.  . [name] hematuria 05/2018   Occurred 2+ mo's after prostate bx: attributed to bx by urol, treated empirically with keflex.  Urol initially suspected gross hem was sequela of recent prost bx. BUT recurrence of gross hem prompted full hematuria w/u 11/15/18.  Marland Kitchen Hearing loss    Bilateral   . History of radiation therapy 11/10/13-12/12/13   lung,50Gy/49fx  . Hyperlipidemia    Crestor= myalgias  . Hypertension   . Lung cancer ([place]) 05/2013   non-small cell;  L upper lobectomy with  mediastinal LN dissection, chemo, and radiation in 2014/2015. Remission until pt had questionable seizure 07/2015--MRI showed brain mets; palliative brain rad (stereotactic radiation therapy) started 08/25/15.  CT C/A/P clear 02/2016, 05/2016, 11/2016, 12/2018. MRI brain 12/2018 stable lesions/no new mets. Rpt 6 mo per onc.  . Malignant neoplasm of upper lobe, left bronchus or lung ([place]) 10/29/2013   *?New spiculated 4.5cm mass in L upper lung field on CXR at [facility] in [place] 07/14/17.  Dr. Julien Nordmann (onc) recommended f/u CXR after abx course.  If mass unchanged then repeat CT chest.  Surveillance CT chest and MRI brain 06/2018 show no sign of dz.  . Myocardial infarction [facility]) 1992   Dr Angelena Form    . Peripheral vascular disease ([place]) 08/2012   4.8x4.6 cm infrarenal abdominal aortic fusiform aneurysm, 1.5 cm right common iliac artery aneurysm.  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  Marland Kitchen Pneumonia 09/06/2018   XRAY was done at [facility].  . S/P radiation therapy [facility] 08/25/15   Stereotactic radiation therapy: frontoparietal 18gy,posterior frontal lobe 20gy,  . Seizure disorder ([place]) 2016/2017   as sequela of brain mets;  Grand mal seizure 07/2015, then got on keppra and was seizure-free until focal motor seizures of L side of face began 02/2016- these responded well to  up-titration of keppra but pt had adverse side effects so eventually pt had to be switched over to lamictal 07/2016--stable/seizure free on this med as of 09/2018 neuro f/u.  Marland Kitchen Shortness of breath   . Tobacco dependence    "Quit" 1992, but pt has smoked "on and off" since that time    Past Surgical History:  Procedure Laterality Date  . ABDOMINAL AORTIC ENDOVASCULAR STENT GRAFT N/A 05/07/2014   Procedure: ABDOMINAL AORTIC ENDOVASCULAR STENT GRAFT;  Surgeon: Serafina Mitchell, MD;  Location: [facility];  Service: Vascular;   Laterality: N/A;  . Carotid duplex dopplers  07/2015   1-39% on R, no signif dz noted on L  . COLONOSCOPY W/ POLYPECTOMY  2003    negative 2010,due 2020; Dr Olevia Perches  . CORONARY ANGIOPLASTY     no stents  . CYSTOSCOPY/RETROGRADE/URETEROSCOPY Bilateral 10/09/2012   Procedure: BILATERAL RETROGRADE bladder and urethral BIOPSY ;  Surgeon: Molli Hazard, MD;  Location: [facility];  Service: Urology;  Laterality: Bilateral;  BILATERAL RETROGRADE   . EEG  08/05/15   Pt placed on keppra just prior to this test due to having ? seizure (MRI showed brain mets)  . HERNIA REPAIR Bilateral    Inguinal  . INGUINAL HERIIORRHAPHY BILATERALLY    . IR ANGIOGRAM SELECTIVE EACH ADDITIONAL VESSEL  12/03/2017  . IR ANGIOGRAM VISCERAL SELECTIVE  12/03/2017  . IR ANGIOGRAM VISCERAL SELECTIVE  12/03/2017  . IR EMBO ARTERIAL NOT HEMORR HEMANG INC GUIDE ROADMAPPING  12/03/2017  . IR GENERIC HISTORICAL  07/20/2016   IR RADIOLOGIST EVAL & MGMT 07/20/2016 [facility] INTERV RAD  . IR GENERIC HISTORICAL  08/02/2016   IR RADIOLOGIST EVAL & MGMT 08/02/2016 Sandi Mariscal, MD [facility] INTERV RAD  . IR RADIOLOGIST EVAL & MGMT  11/08/2017  . IR RADIOLOGIST EVAL & MGMT  01/01/2018  . IR US GUIDE VASC ACCESS RIGHT  12/03/2017  . MEDIASTINOSCOPY N/A 05/01/2013   Procedure: MEDIASTINOSCOPY;  Surgeon: Gaye Pollack, MD;  Location: [facility] OR;  Service: Thoracic;  Laterality: N/A;  . PFTs  04/2013   Minimal obstructive airway disease  . PILONIDAL CYST EXCISION    . PROSTATE BIOPSY N/A 10/09/2012   NEG bx 04/12/18 as well.  Procedure: PROSTATIC URETHRAL BIOPSY--BPH--no evidence of malignancy;  Surgeon: Molli Hazard, MD;  Location: [facility];  Service: Urology;  Laterality: N/A;  PROSTATIC URETHRAL BIOPSY  . PROSTATE BIOPSY  03/2018   NEG  . PTCA  1992  . ROTATOR CUFF REPAIR     Bilateral  . THOROCOTOMY WITH LOBECTOMY Left 05/29/2013   Procedure: LEFT THOROCOTOMY WITH LEFT UPPER LOBE LOBECTOMY;  Surgeon: Gaye Pollack, MD;  Location: [facility];   Service: Thoracic;  Laterality: Left;  Marland Kitchen VIDEO BRONCHOSCOPY N/A 05/01/2013   Procedure: VIDEO BRONCHOSCOPY;  Surgeon: Gaye Pollack, MD;  Location: [facility] OR;  Service: Thoracic;  Laterality: N/A;    Outpatient Medications Prior to Visit  Medication Sig Dispense Refill  . albuterol (PROVENTIL HFA;VENTOLIN HFA) 108 (90 Base) MCG/ACT inhaler Inhale 2 puffs into the lungs every 6 (six) hours as needed for wheezing or shortness of breath. 18 g 1  . amLODipine (NORVASC) 5 MG tablet Take 1 tablet (5 mg total) by mouth daily. OFFICE VISIT NEEDED FOR FURTHER REFILLS. 90 tablet 1  . aspirin EC 81 MG tablet Take 81 mg by mouth at bedtime.     . budesonide-formoterol (SYMBICORT) 160-4.5 MCG/ACT inhaler INHALE 1-2 PUFFS INTO THE LINGS EVERY 12 HOURS. GARGLE AND SPIT AFTER USE.  30.6 Inhaler 1  . diphenhydrAMINE (BENADRYL) 50 MG tablet Take 1 tablet (50 mg total) by mouth as directed. Take 1 tablet (50 mg) by mouth 1 hr prior to CTA (scheduled for 11/08/2017) 1 tablet 3  . lamoTRIgine (LAMICTAL) 200 MG tablet TAKE 1 TABLET BY MOUTH IN THE MORNING AND 1&1/2 TABLETS IN THE EVENING 216 tablet 1  . metoprolol tartrate (LOPRESSOR) 50 MG tablet Take 1 tablet (50 mg total) by mouth 2 (two) times daily. 180 tablet 0  . Multiple Vitamin (MULTIVITAMIN WITH MINERALS) TABS tablet Take 1 tablet by mouth daily.    . Multiple Vitamin (MULTIVITAMIN) tablet Take 1 tablet by mouth daily.     . pentoxifylline (TRENTAL) 400 MG CR tablet Take 1 tablet (400 mg total) by mouth 3 (three) times daily. 90 tablet 5  . Potassium 99 MG TABS Take 1 tablet by mouth daily.    . tamsulosin (FLOMAX) 0.4 MG CAPS capsule Take 0.4 mg by mouth daily.    Marland Kitchen atorvastatin (LIPITOR) 20 MG tablet Take 1 tablet (20 mg total) by mouth daily. 90 tablet 1  . acetaminophen (TYLENOL 8 HOUR) 650 MG CR tablet Take 1 tablet (650 mg total) by mouth every 6 (six) hours as needed. (Patient not taking: Reported on 01/06/2019) 30 tablet 0  . BESIVANCE 0.6 % SUSP APPLY 1  DROP INTO RIGHT EYE THREE TIMES A DAY AS DIRECTED    . DUREZOL 0.05 % EMUL INSTILL 1 DROP INTO RIGHT EYE 3 TIMES A DAY AS DIRECTED    . predniSONE (DELTASONE) 50 MG tablet Take one tablet 13 hours, one tablet 7 hours and one tablet 1 hour prior to scan. (Patient not taking: Reported on 05/09/2019) 3 tablet 3  . SYMBICORT 160-4.5 MCG/ACT inhaler     . varenicline (CHANTIX CONTINUING MONTH PAK) 1 MG tablet Take 1 tablet (1 mg total) by mouth 2 (two) times daily. (Patient not taking: Reported on 05/09/2019) 60 tablet 1  . varenicline (CHANTIX STARTING MONTH PAK) 0.5 MG X 11 & 1 MG X 42 tablet Take one 0.5 mg tablet by mouth once daily for 3 days, then increase to one 0.5 mg tablet twice daily for 4 days, then increase to one 1 mg tablet twice daily. (Patient not taking: Reported on 05/09/2019) 53 tablet 0   No facility-administered medications prior to visit.     Allergies  Allergen Reactions  . Fluvirin [Influenza Vac Split Quad] Nausea And Vomiting and Other (See Comments)    Seizure like activity?  . Quinolones Other (See Comments)    Pt with aneurism: need to avoid quinoline use b/c of weakening of vascular wall that can be associated with quinolones.  . Rosuvastatin Other (See Comments)    Myalgias and dark urine  . Iodinated Diagnostic Agents Hives    1 hive on lt cheek lasting approximately 1 hour on last 2 CT per pt; needs pre meds in future; 50 mg benadryl po 1 hr prior to exam per Dr. Weber Cooks 07/2014  11/2016 per Tery Sanfilippo, pt needs 13hr premeds per our policy.    ROS As per HPI  PE: Blood pressure 138/69, pulse 80, temperature 98.3 F (36.8 C), temperature source Temporal, resp. rate 16, height 5\' 3"  (1.6 m), weight 140 lb (63.5 kg), SpO2 97 %. Body mass index is 24.8 kg/m.  Gen: Alert, well appearing.  Patient is oriented to person, place, time, and situation. AFFECT: pleasant, lucid thought and speech. No further exam today.  LABS:  Chemistry      Component Value  Date/Time   NA 141 01/03/2019 0930   NA 141 06/18/2017 1006   K 4.2 01/03/2019 0930   K 5.3 (H) 06/18/2017 1006   CL 106 01/03/2019 0930   CO2 25 01/03/2019 0930   CO2 25 06/18/2017 1006   BUN 13 01/03/2019 0930   BUN 14.3 06/18/2017 1006   CREATININE 1.05 01/03/2019 0930   CREATININE 0.9 06/18/2017 1006      Component Value Date/Time   CALCIUM 9.7 01/03/2019 0930   CALCIUM 10.3 06/18/2017 1006   ALKPHOS 74 01/03/2019 0930   ALKPHOS 63 06/18/2017 1006   AST 14 (L) 01/03/2019 0930   AST 15 06/18/2017 1006   ALT 17 01/03/2019 0930   ALT 17 06/18/2017 1006   BILITOT 0.3 01/03/2019 0930   BILITOT 0.35 06/18/2017 1006     Lab Results  Component Value Date   WBC 11.0 (H) 01/03/2019   HGB 15.3 01/03/2019   HCT 47.0 01/03/2019   MCV 87.4 01/03/2019   PLT 267 01/03/2019   Lab Results  Component Value Date   CHOL 142 12/14/2017   HDL 51.90 12/14/2017   LDLCALC 77 12/14/2017   TRIG 70.0 12/14/2017   CHOLHDL 3 12/14/2017    IMPRESSION AND PLAN:  1) HTN: The current medical regimen is effective;  continue present plan and medications. Lytes/cr today. Needs to monitor bp at home occasionally.  2) HLD: tolerating statin. FLP and hepatic panel today.  3) Tobacco dependence: he is ashamed and knows he needs to quit since he has copd and lung ca hx.  Failed nicotine replacement and chantix. Will start trial of wellbutrin xl 150mg  qd today. Pt interested in [facility] quit line so I gave him this number.  An After Visit Summary was printed and given to the patient.  FOLLOW UP: Return in about 4 weeks (around 06/06/2019) for f/u tob cess.  Signed:  Crissie Sickles, MD           05/09/2019

## 2019-05-09 NOTE — Patient Instructions (Signed)
Call the tobacco "quit line"-->1-800-QUITNOW

## 2019-05-31 ENCOUNTER — Other Ambulatory Visit: Payer: Self-pay | Admitting: Family Medicine

## 2019-06-05 ENCOUNTER — Other Ambulatory Visit: Payer: Self-pay | Admitting: Family Medicine

## 2019-06-05 ENCOUNTER — Other Ambulatory Visit: Payer: Self-pay

## 2019-06-05 ENCOUNTER — Encounter: Payer: Self-pay | Admitting: Family Medicine

## 2019-06-05 ENCOUNTER — Ambulatory Visit (INDEPENDENT_AMBULATORY_CARE_PROVIDER_SITE_OTHER): Payer: Medicare Other | Admitting: Family Medicine

## 2019-06-05 VITALS — Wt 142.0 lb

## 2019-06-05 DIAGNOSIS — J343 Hypertrophy of nasal turbinates: Secondary | ICD-10-CM | POA: Diagnosis not present

## 2019-06-05 DIAGNOSIS — J3801 Paralysis of vocal cords and larynx, unilateral: Secondary | ICD-10-CM | POA: Insufficient documentation

## 2019-06-05 DIAGNOSIS — T887XXA Unspecified adverse effect of drug or medicament, initial encounter: Secondary | ICD-10-CM

## 2019-06-05 DIAGNOSIS — F17200 Nicotine dependence, unspecified, uncomplicated: Secondary | ICD-10-CM

## 2019-06-05 DIAGNOSIS — R490 Dysphonia: Secondary | ICD-10-CM | POA: Insufficient documentation

## 2019-06-05 NOTE — Progress Notes (Signed)
Virtual Visit via Video Note  I connected with Jordan Mccullough on 06/05/19 at 10:30 AM EDT by telephone (a video enabled telemedicine application had technical difficulties) and verified that I am speaking with the correct person using two identifiers.  Location patient: home Location provider:work or home office Persons participating in the virtual visit: patient, provider  I discussed the limitations of evaluation and management by telemedicine and the availability of in person appointments. The patient expressed understanding and agreed to proceed.  Telemedicine visit is a necessity given the COVID-19 restrictions in place at the current time.  HPI: 78 y/o WM being seen today for 1 mo f/u smoking cessation: started a trial of wellbutrin xl 150mg  qd last visit.  Interim hx: Started having cheek and arm spasm (tremor?) after he started wellbutrin.   No painful spasm.  Took it a few days. Called his neurologist and was told to stop it b/c of risk of seizure on this med. He stopped it and the tremor/ muscle sx's resolved 2-3 d later.   He feels fine now. He smokes 1 ppd of [place] lights, has cig right away in morning.  No waking in the night to have one.  Wants to try nicotine replacement again.  ROS: See pertinent positives and negatives per HPI.  Past Medical History:  Diagnosis Date  . AAA (abdominal aortic aneurysm) (HCC)    s/p repair (aortoiliac bipass; with persistent endograft leak in inferior mesenteric artery)--stable as of 01/2017 vascular f/u.  Marland Kitchen Asymptomatic cholelithiasis 07/2015   Incidental finding on PET CT  . Back pain 04/19/2016  . BPH with elevated PSA    PSA signif rise 11/2017 (5.4 in 2015 to 31.21 November 2017. Bx 03/2018 benign.  . Coronary artery disease    MI 1992, S/P  PTCA; negative stress test in November 2011 with no ischemia.   . Diverticulosis   . Elevated PSA 01/2018   01/22/18:  PSA 26.5.  Prostate MRI with abnormality->subsequent prostate bx BENIGN 04/12/18.  Rpt  PSA 03/07/18 stable at 24.1-->f/u 1 yr with urologist.  . [name] hematuria 05/2018   Occurred 2+ mo's after prostate bx: attributed to bx by urol, treated empirically with keflex.  Urol initially suspected gross hem was sequela of recent prost bx. BUT recurrence of gross hem prompted full hematuria w/u 11/15/18.  Marland Kitchen Hearing loss    Bilateral   . History of radiation therapy 11/10/13-12/12/13   lung,50Gy/93fx  . Hyperlipidemia    Crestor= myalgias  . Hypertension   . Lung cancer ([place]) 05/2013   non-small cell;  L upper lobectomy with mediastinal LN dissection, chemo, and radiation in 2014/2015. Remission until Jordan Mccullough had questionable seizure 07/2015--MRI showed brain mets; palliative brain rad (stereotactic radiation therapy) started 08/25/15.  CT C/A/P clear 02/2016, 05/2016, 11/2016, 12/2018. MRI brain 12/2018 stable lesions/no new mets. Rpt 6 mo per onc.  . Malignant neoplasm of upper lobe, left bronchus or lung ([place]) 10/29/2013   *?New spiculated 4.5cm mass in L upper lung field on CXR at [facility] in [place] 07/14/17.  Dr. Julien Nordmann (onc) recommended f/u CXR after abx course.  If mass unchanged then repeat CT chest.  Surveillance CT chest and MRI brain 06/2018 show no sign of dz.  . Myocardial infarction [facility]) 1992   Dr Angelena Form    . Peripheral vascular disease ([place]) 08/2012   4.8x4.6 cm infrarenal abdominal aortic fusiform aneurysm, 1.5 cm right common iliac artery aneurysm.  Type II endoleak from inferior mesenteric artery--Vasc surg referred Jordan Mccullough  to interv rad for possible embolization of the leak as of 07/17/16.  Marland Kitchen Pneumonia 09/06/2018   XRAY was done at [place].  . S/P radiation therapy [facility] 08/25/15   Stereotactic radiation therapy: frontoparietal 18gy,posterior frontal lobe 20gy,  . Seizure disorder ([place]) 2016/2017   as sequela of brain mets;  Grand mal seizure 07/2015, then got on keppra and was seizure-free until focal motor seizures of L side of face began 02/2016- these  responded well to up-titration of keppra but Jordan Mccullough had adverse side effects so eventually Jordan Mccullough had to be switched over to lamictal 07/2016--stable/seizure free on this med as of 09/2018 neuro f/u.  Marland Kitchen Shortness of breath   . Tobacco dependence    "Quit" 1992, but Jordan Mccullough has smoked "on and off" since that time    Past Surgical History:  Procedure Laterality Date  . ABDOMINAL AORTIC ENDOVASCULAR STENT GRAFT N/A 05/07/2014   Procedure: ABDOMINAL AORTIC ENDOVASCULAR STENT GRAFT;  Surgeon: Serafina Mitchell, MD;  Location: [facility];  Service: Vascular;  Laterality: N/A;  . Carotid duplex dopplers  07/2015   1-39% on R, no signif dz noted on L  . COLONOSCOPY W/ POLYPECTOMY  2003    negative 2010,due 2020; Dr Olevia Perches  . CORONARY ANGIOPLASTY     no stents  . CYSTOSCOPY/RETROGRADE/URETEROSCOPY Bilateral 10/09/2012   Procedure: BILATERAL RETROGRADE bladder and urethral BIOPSY ;  Surgeon: Molli Hazard, MD;  Location: [facility];  Service: Urology;  Laterality: Bilateral;  BILATERAL RETROGRADE   . EEG  08/05/15   Jordan Mccullough placed on keppra just prior to this test due to having ? seizure (MRI showed brain mets)  . HERNIA REPAIR Bilateral    Inguinal  . INGUINAL HERIIORRHAPHY BILATERALLY    . IR ANGIOGRAM SELECTIVE EACH ADDITIONAL VESSEL  12/03/2017  . IR ANGIOGRAM VISCERAL SELECTIVE  12/03/2017  . IR ANGIOGRAM VISCERAL SELECTIVE  12/03/2017  . IR EMBO ARTERIAL NOT HEMORR HEMANG INC GUIDE ROADMAPPING  12/03/2017  . IR GENERIC HISTORICAL  07/20/2016   IR RADIOLOGIST EVAL & MGMT 07/20/2016 [facility] INTERV RAD  . IR GENERIC HISTORICAL  08/02/2016   IR RADIOLOGIST EVAL & MGMT 08/02/2016 Sandi Mariscal, MD [facility] INTERV RAD  . IR RADIOLOGIST EVAL & MGMT  11/08/2017  . IR RADIOLOGIST EVAL & MGMT  01/01/2018  . IR US GUIDE VASC ACCESS RIGHT  12/03/2017  . MEDIASTINOSCOPY N/A 05/01/2013   Procedure: MEDIASTINOSCOPY;  Surgeon: Gaye Pollack, MD;  Location: [facility] OR;  Service: Thoracic;  Laterality: N/A;  . PFTs  04/2013   Minimal  obstructive airway disease  . PILONIDAL CYST EXCISION    . PROSTATE BIOPSY N/A 10/09/2012   NEG bx 04/12/18 as well.  Procedure: PROSTATIC URETHRAL BIOPSY--BPH--no evidence of malignancy;  Surgeon: Molli Hazard, MD;  Location: [facility];  Service: Urology;  Laterality: N/A;  PROSTATIC URETHRAL BIOPSY  . PROSTATE BIOPSY  03/2018   NEG  . PTCA  1992  . ROTATOR CUFF REPAIR     Bilateral  . THOROCOTOMY WITH LOBECTOMY Left 05/29/2013   Procedure: LEFT THOROCOTOMY WITH LEFT UPPER LOBE LOBECTOMY;  Surgeon: Gaye Pollack, MD;  Location: [place];  Service: Thoracic;  Laterality: Left;  Marland Kitchen VIDEO BRONCHOSCOPY N/A 05/01/2013   Procedure: VIDEO BRONCHOSCOPY;  Surgeon: Gaye Pollack, MD;  Location: [facility] OR;  Service: Thoracic;  Laterality: N/A;    Family History  Problem Relation Age of Onset  . Hypertension Mother   . Prostate cancer Father  Prostate cancer  . Cancer Father        Brain Tumor  . Lung cancer Sister        NON SMOKER  . Cancer Sister   . Hyperlipidemia Sister   . Hyperlipidemia Brother   . Heart attack Brother   . Hypertension Brother   . Prostate cancer Brother   . Diabetes Neg Hx   . Stroke Neg Hx       Current Outpatient Medications:  .  albuterol (PROVENTIL HFA;VENTOLIN HFA) 108 (90 Base) MCG/ACT inhaler, Inhale 2 puffs into the lungs every 6 (six) hours as needed for wheezing or shortness of breath., Disp: 18 g, Rfl: 1 .  amLODipine (NORVASC) 5 MG tablet, Take 1 tablet (5 mg total) by mouth daily. OFFICE VISIT NEEDED FOR FURTHER REFILLS., Disp: 90 tablet, Rfl: 1 .  aspirin EC 81 MG tablet, Take 81 mg by mouth at bedtime. , Disp: , Rfl:  .  atorvastatin (LIPITOR) 20 MG tablet, Take 1 tablet (20 mg total) by mouth daily., Disp: 90 tablet, Rfl: 3 .  budesonide-formoterol (SYMBICORT) 160-4.5 MCG/ACT inhaler, INHALE 1-2 PUFFS INTO THE LINGS EVERY 12 HOURS. GARGLE AND SPIT AFTER USE., Disp: 30.6 Inhaler, Rfl: 1 .  diphenhydrAMINE (BENADRYL) 50 MG tablet, Take 1 tablet  (50 mg total) by mouth as directed. Take 1 tablet (50 mg) by mouth 1 hr prior to CTA (scheduled for 11/08/2017), Disp: 1 tablet, Rfl: 3 .  lamoTRIgine (LAMICTAL) 200 MG tablet, TAKE 1 TABLET BY MOUTH IN THE MORNING AND 1&1/2 TABLETS IN THE EVENING, Disp: 216 tablet, Rfl: 1 .  metoprolol tartrate (LOPRESSOR) 50 MG tablet, Take 1 tablet (50 mg total) by mouth 2 (two) times daily., Disp: 180 tablet, Rfl: 0 .  Multiple Vitamin (MULTIVITAMIN WITH MINERALS) TABS tablet, Take 1 tablet by mouth daily., Disp: , Rfl:  .  pentoxifylline (TRENTAL) 400 MG CR tablet, Take 1 tablet (400 mg total) by mouth 3 (three) times daily., Disp: 90 tablet, Rfl: 5 .  tamsulosin (FLOMAX) 0.4 MG CAPS capsule, Take 0.4 mg by mouth daily., Disp: , Rfl:  .  acetaminophen (TYLENOL 8 HOUR) 650 MG CR tablet, Take 1 tablet (650 mg total) by mouth every 6 (six) hours as needed. (Patient not taking: Reported on 01/06/2019), Disp: 30 tablet, Rfl: 0 .  buPROPion (WELLBUTRIN XL) 150 MG 24 hr tablet, Take 1 tablet (150 mg total) by mouth daily. (Patient not taking: Reported on 06/05/2019), Disp: 30 tablet, Rfl: 0 .  predniSONE (DELTASONE) 50 MG tablet, Take one tablet 13 hours, one tablet 7 hours and one tablet 1 hour prior to scan. (Patient not taking: Reported on 06/05/2019), Disp: 3 tablet, Rfl: 3  EXAM:  VITALS per patient if applicable: Wt 142 lb (64.4 kg)   BMI 25.15 kg/m    GENERAL: alert, oriented,  sounds well and in no acute distress No further exam b/c audio only encounter.  LABS: none today    Chemistry      Component Value Date/Time   NA 139 05/09/2019 1132   NA 141 06/18/2017 1006   K 4.1 05/09/2019 1132   K 5.3 (H) 06/18/2017 1006   CL 104 05/09/2019 1132   CO2 25 05/09/2019 1132   CO2 25 06/18/2017 1006   BUN 16 05/09/2019 1132   BUN 14.3 06/18/2017 1006   CREATININE 0.96 05/09/2019 1132   CREATININE 1.05 01/03/2019 0930   CREATININE 0.9 06/18/2017 1006      Component Value Date/Time   CALCIUM 9.8  05/09/2019 1132   CALCIUM 10.3 06/18/2017 1006   ALKPHOS 67 05/09/2019 1132   ALKPHOS 63 06/18/2017 1006   AST 14 05/09/2019 1132   AST 14 (L) 01/03/2019 0930   AST 15 06/18/2017 1006   ALT 13 05/09/2019 1132   ALT 17 01/03/2019 0930   ALT 17 06/18/2017 1006   BILITOT 0.4 05/09/2019 1132   BILITOT 0.3 01/03/2019 0930   BILITOT 0.35 06/18/2017 1006     ASSESSMENT AND PLAN:  Discussed the following assessment and plan:  1) Tobacco/cig dependence: he can't take wellbutrin due to hx of seizures-->use of it recently was likely causing some simple partial seizures, unfortunately.  He has been back to normal since stopping wellbutrin. We discussed a 3 mo nicotine replacement regimen today: 21mg  qd patch x 7mo, then 14mg  x 60mo, then 7 mg x 52mo.  May use nicotine gum or lozenge 3-4 times per day prn craving during this time.   Spent 15 min with Jordan Mccullough today, with >50% of this time spent in counseling and care coordination regarding the above problems.  I discussed the assessment and treatment plan with the patient. The patient was provided an opportunity to ask questions and all were answered. The patient agreed with the plan and demonstrated an understanding of the instructions.   The patient was advised to call back or seek an in-person evaluation if the symptoms worsen or if the condition fails to improve as anticipated.  F/u: 5-6 mo RCI  Signed:  Crissie Sickles, MD           06/05/2019

## 2019-06-06 ENCOUNTER — Encounter: Payer: Self-pay | Admitting: Podiatry

## 2019-06-06 ENCOUNTER — Ambulatory Visit: Payer: Medicare Other | Admitting: Podiatry

## 2019-06-06 ENCOUNTER — Other Ambulatory Visit: Payer: Self-pay

## 2019-06-06 DIAGNOSIS — B351 Tinea unguium: Secondary | ICD-10-CM

## 2019-06-06 DIAGNOSIS — M79675 Pain in left toe(s): Secondary | ICD-10-CM

## 2019-06-06 DIAGNOSIS — M79674 Pain in right toe(s): Secondary | ICD-10-CM

## 2019-06-06 NOTE — Patient Instructions (Signed)

## 2019-06-12 NOTE — Progress Notes (Signed)
Subjective: Jordan Mccullough is seen today for follow up painful, elongated, thickened toenails 1-5 b/l feet that he cannot cut. Pain interferes with daily activities. Aggravating factor includes wearing enclosed shoe gear and relieved with periodic debridement.  He is on blood thinner, Trental.    [name] has no other complaints on today.   Current Outpatient Medications on File Prior to Visit  Medication Sig  . acetaminophen (TYLENOL 8 HOUR) 650 MG CR tablet Take 1 tablet (650 mg total) by mouth every 6 (six) hours as needed.  Marland Kitchen albuterol (PROVENTIL HFA;VENTOLIN HFA) 108 (90 Base) MCG/ACT inhaler Inhale 2 puffs into the lungs every 6 (six) hours as needed for wheezing or shortness of breath.  Marland Kitchen amLODipine (NORVASC) 5 MG tablet Take 1 tablet (5 mg total) by mouth daily. OFFICE VISIT NEEDED FOR FURTHER REFILLS.  Marland Kitchen aspirin EC 81 MG tablet Take 81 mg by mouth at bedtime.   Marland Kitchen atorvastatin (LIPITOR) 20 MG tablet Take 1 tablet (20 mg total) by mouth daily.  . budesonide-formoterol (SYMBICORT) 160-4.5 MCG/ACT inhaler INHALE 1-2 PUFFS INTO THE LINGS EVERY 12 HOURS. GARGLE AND SPIT AFTER USE.  . diphenhydrAMINE (BENADRYL) 50 MG tablet Take 1 tablet (50 mg total) by mouth as directed. Take 1 tablet (50 mg) by mouth 1 hr prior to CTA (scheduled for 11/08/2017)  . lamoTRIgine (LAMICTAL) 200 MG tablet TAKE 1 TABLET BY MOUTH IN THE MORNING AND 1&1/2 TABLETS IN THE EVENING  . metoprolol tartrate (LOPRESSOR) 50 MG tablet Take 1 tablet (50 mg total) by mouth 2 (two) times daily.  . Multiple Vitamin (MULTIVITAMIN WITH MINERALS) TABS tablet Take 1 tablet by mouth daily.  . pentoxifylline (TRENTAL) 400 MG CR tablet Take 1 tablet (400 mg total) by mouth 3 (three) times daily.  . predniSONE (DELTASONE) 50 MG tablet Take one tablet 13 hours, one tablet 7 hours and one tablet 1 hour prior to scan.  . tamsulosin (FLOMAX) 0.4 MG CAPS capsule Take 0.4 mg by mouth daily.  . multivitamin (VIT W/EXTRA C) CHEW chewable  tablet Chew by mouth.   No current facility-administered medications on file prior to visit.      Allergies  Allergen Reactions  . Fluvirin [Influenza Vac Split Quad] Nausea And Vomiting and Other (See Comments)    Seizure like activity?  . Influenza Vaccines Other (See Comments)    Seizure like activity?  . Quinolones Other (See Comments)    Pt with aneurism: need to avoid quinoline use b/c of weakening of vascular wall that can be associated with quinolones.  . Wellbutrin [Bupropion] Other (See Comments)    AVOID IN PT W/HX OF SEIZURES  . Rosuvastatin Other (See Comments)    Myalgias and dark urine  . Iodinated Diagnostic Agents Hives    1 hive on lt cheek lasting approximately 1 hour on last 2 CT per pt; needs pre meds in future; 50 mg benadryl po 1 hr prior to exam per Dr. Weber Cooks 07/2014  11/2016 per Tery Sanfilippo, pt needs 13hr premeds per our policy.    Objective:  Vascular Examination: Capillary refill time immediate x 10 digits.  Dorsalis pedis present b/l.  Posterior tibial pulses present b/l.  Digital hair present x 10 digits.  Skin temperature gradient WNL b/l.   Dermatological Examination: Skin is thin, shiny and atrophic b/l.  No open wounds b/l.  Toenails 1-5 b/l discolored, thick, dystrophic with subungual debris and pain with palpation to nailbeds due to thickness of nails.  Musculoskeletal: Muscle strength 5/5 to  all LE muscle groups  No gross bony deformities b/l.  No pain, crepitus or joint limitation noted with ROM.   Neurological Examination: Protective sensation intact 5/5 b/l with 10 gram monofilament bilaterally.  Vibratory sensation intact bilaterally.   Assessment: Painful onychomycosis toenails 1-5 b/l  Pt on long term blood thinner  Plan: 1. Toenails 1-5 b/l were debrided in length and girth without iatrogenic bleeding. 2. Patient to continue soft, supportive shoe gear 3. Patient to report any pedal injuries to medical professional  immediately. 4. Follow up 3 months.  5. Patient/POA to call should there be a concern in the interim.

## 2019-06-26 ENCOUNTER — Telehealth: Payer: Self-pay | Admitting: Internal Medicine

## 2019-06-26 NOTE — Telephone Encounter (Signed)
Scheduled appt per 11/5 sch message - pt is aware of change

## 2019-06-30 ENCOUNTER — Encounter: Payer: Self-pay | Admitting: Internal Medicine

## 2019-06-30 ENCOUNTER — Other Ambulatory Visit: Payer: Self-pay | Admitting: Internal Medicine

## 2019-06-30 ENCOUNTER — Other Ambulatory Visit: Payer: Self-pay | Admitting: Medical Oncology

## 2019-06-30 DIAGNOSIS — Z91041 Radiographic dye allergy status: Secondary | ICD-10-CM

## 2019-07-01 ENCOUNTER — Other Ambulatory Visit: Payer: Self-pay | Admitting: Radiation Therapy

## 2019-07-03 ENCOUNTER — Other Ambulatory Visit: Payer: Self-pay

## 2019-07-03 ENCOUNTER — Ambulatory Visit
Admission: RE | Admit: 2019-07-03 | Discharge: 2019-07-03 | Disposition: A | Discharge status: Home / Self Care | Payer: Medicare Other | Source: Ambulatory Visit | Attending: Radiation Oncology | Admitting: Radiation Oncology

## 2019-07-03 DIAGNOSIS — G939 Disorder of brain, unspecified: Secondary | ICD-10-CM | POA: Diagnosis not present

## 2019-07-03 DIAGNOSIS — C7949 Secondary malignant neoplasm of other parts of nervous system: Secondary | ICD-10-CM

## 2019-07-03 DIAGNOSIS — C3490 Malignant neoplasm of unspecified part of unspecified bronchus or lung: Secondary | ICD-10-CM | POA: Diagnosis not present

## 2019-07-03 DIAGNOSIS — C7931 Secondary malignant neoplasm of brain: Secondary | ICD-10-CM

## 2019-07-03 MED ORDER — GADOBENATE DIMEGLUMINE 529 MG/ML IV SOLN
13.0000 mL | Freq: Once | INTRAVENOUS | Status: AC | PRN
  Administered 2019-07-03: 13 mL via INTRAVENOUS

## 2019-07-07 ENCOUNTER — Ambulatory Visit
Admission: RE | Admit: 2019-07-07 | Discharge: 2019-07-07 | Disposition: A | Discharge status: Home / Self Care | Payer: Medicare Other | Source: Ambulatory Visit | Attending: Radiation Oncology | Admitting: Radiation Oncology

## 2019-07-07 ENCOUNTER — Inpatient Hospital Stay: Payer: Medicare Other | Attending: Internal Medicine

## 2019-07-07 ENCOUNTER — Other Ambulatory Visit: Payer: Self-pay

## 2019-07-07 ENCOUNTER — Encounter: Payer: Self-pay | Admitting: Radiation Oncology

## 2019-07-07 DIAGNOSIS — Z85118 Personal history of other malignant neoplasm of bronchus and lung: Secondary | ICD-10-CM | POA: Diagnosis not present

## 2019-07-07 DIAGNOSIS — C3412 Malignant neoplasm of upper lobe, left bronchus or lung: Secondary | ICD-10-CM | POA: Insufficient documentation

## 2019-07-07 DIAGNOSIS — C7931 Secondary malignant neoplasm of brain: Secondary | ICD-10-CM

## 2019-07-07 DIAGNOSIS — F17210 Nicotine dependence, cigarettes, uncomplicated: Secondary | ICD-10-CM

## 2019-07-07 DIAGNOSIS — R972 Elevated prostate specific antigen [PSA]: Secondary | ICD-10-CM

## 2019-07-07 DIAGNOSIS — Z08 Encounter for follow-up examination after completed treatment for malignant neoplasm: Secondary | ICD-10-CM | POA: Diagnosis not present

## 2019-07-07 DIAGNOSIS — F17200 Nicotine dependence, unspecified, uncomplicated: Secondary | ICD-10-CM

## 2019-07-07 DIAGNOSIS — Z87898 Personal history of other specified conditions: Secondary | ICD-10-CM

## 2019-07-07 NOTE — Progress Notes (Signed)
Radiation Oncology         [phone]  Follow Up Evaluation - Conducted via telephone due to current COVID-19 concerns for limiting patient exposure  I spoke with the patient to conduct this visit via telephone to spare the patient unnecessary potential exposure in the healthcare setting during the current COVID-19 pandemic. The patient was notified in advance and was offered a [facility] meeting to allow for face to face communication but unfortunately reported that they did not have the appropriate resources/technology to support such a visit and instead preferred to proceed with a telephone conversation.  ________________________________  Name: Jordan Mccullough MRN: [id number]  Date: 07/07/2019  DOB: 02-03-1941  Follow-Up Visit Note  CC: McGowen, Jordan Blackwater, MD  Jordan Pollack, MD  Diagnosis:   Metastatic Lung Cancer  Interval Since Radiotherapy: 3 years,  10 months  08/25/15 SRS Treatment:    1.  PTV1 Rt Vertex Frontoparietal 70mm target was treated using 4 Arcs to a prescription dose of 18 Gy. ExacTrac Snap verification was performed for each couch angle.  2.  PTV2  Rt Posterior Frontal Lobe 34mm target was treated using 4 Arcs to a prescription dose of 20 Gy. ExacTrac Snap verification was performed for each couch angle.   Narrative:   Jordan Mccullough is a very pleasant 78 y.o. gentleman with a history of metastatic non-small cell lung cancer who received SRS to 2 lesions in the frontoparietal region which he completed in January of 2017. He has had stability in the brain since his treatment. He was treated with trental/vitamine e for radionecrosis which has resolved. He continues to be NED as well on CT restaging scans and follows in observation with Dr. Julien Mccullough.  He recently underwent a repeat MRI of the brain on 07/03/2019, and evidence of prior treatment to the 2 lesions in the frontoparietal region were noted without any new disease.  He is scheduled to have repeat staging imaging on Thursday  and then to see Dr. Julien Mccullough shortly after.  He reports that he continues to be followed by Dr. Lovena Mccullough at [facility] urology for his elevated PSA.  He did have a negative biopsy in August 2019.  He also continues to follow-up with his PCP Dr. Deberah Mccullough in [facility], he recently had interested in tobacco cessation and was prescribed Wellbutrin.  Unfortunately this did seem to cause concern for a focal seizure and neurology requested that he discontinue this.  He has been trying to use nicotine lozenges as he did not not find patches to be helpful.  He reports he has not been quite as successful thus far however.   On review of systems, the patient reports that he is doing well overall.  He denies any seizures since October 2020.  He states that at that time it was really more of a tremor of the extremity.  He states that he is not having any headaches, visual or auditory disturbances.  He states that while he has been trying to use the lozenges he also has not been as diligent as he should be about trying to quit smoking.   He denies any chest pain, shortness of breath, cough, fevers, chills, night sweats, unintended weight changes. He denies any bowel or bladder disturbances, and denies abdominal pain, nausea or vomiting. He denies any new musculoskeletal or joint aches or pains, new skin lesions or concerns. A complete review of systems is obtained and is otherwise negative.  Past Medical History:  Past Medical History:  Diagnosis Date   AAA (abdominal aortic aneurysm) ([place])    s/p repair (aortoiliac bipass; with persistent endograft leak in inferior mesenteric artery)--stable as of 01/2017 vascular f/u.   Asymptomatic cholelithiasis 07/2015   Incidental finding on PET CT   Back pain 04/19/2016   BPH with elevated PSA    PSA signif rise 11/2017 (5.4 in 2015 to 31.21 November 2017. Bx 03/2018 benign.   Coronary artery disease    MI 1992, S/P  PTCA; negative stress test in November 2011 with no ischemia.      Diverticulosis    Elevated PSA 01/2018   01/22/18:  PSA 26.5.  Prostate MRI with abnormality->subsequent prostate bx BENIGN 04/12/18.  Rpt PSA 03/07/18 stable at 24.1-->f/u 1 yr with urologist.   Johney Mccullough hematuria 05/2018   Occurred 2+ mo's after prostate bx: attributed to bx by urol, treated empirically with keflex.  Urol initially suspected gross hem was sequela of recent prost bx. BUT recurrence of gross hem prompted full hematuria w/u 11/15/18.   Hearing loss    Bilateral    History of radiation therapy 11/10/13-12/12/13   lung,50Gy/88fx   Hyperlipidemia    Crestor= myalgias   Hypertension    Lung cancer ([place]) 05/2013   non-small cell;  L upper lobectomy with mediastinal LN dissection, chemo, and radiation in 2014/2015. Remission until pt had questionable seizure 07/2015--MRI showed brain mets; palliative brain rad (stereotactic radiation therapy) started 08/25/15.  CT C/A/P clear 02/2016, 05/2016, 11/2016, 12/2018. MRI brain 12/2018 stable lesions/no new mets. Rpt 6 mo per onc.   Malignant neoplasm of upper lobe, left bronchus or lung ([place]) 10/29/2013   *?New spiculated 4.5cm mass in L upper lung field on CXR at [facility] in [place] 07/14/17.  Dr. Julien Mccullough (onc) recommended f/u CXR after abx course.  If mass unchanged then repeat CT chest.  Surveillance CT chest and MRI brain 06/2018 show no sign of dz.   Myocardial infarction [facility]) 1992   Dr Angelena Form     Peripheral vascular disease ([place]) 08/2012   4.8x4.6 cm infrarenal abdominal aortic fusiform aneurysm, 1.5 cm right common iliac artery aneurysm.  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   Pneumonia 09/06/2018   XRAY was done at [facility].   S/P radiation therapy [facility] 08/25/15   Stereotactic radiation therapy: frontoparietal 18gy,posterior frontal lobe 20gy,   Seizure disorder ([place]) 2016/2017   as sequela of  brain mets;  Grand mal seizure 07/2015, then got on keppra and was seizure-free until focal motor seizures of L side of face began 02/2016- these responded well to up-titration of keppra but pt had adverse side effects so eventually pt had to be switched over to lamictal 07/2016--stable/seizure free on this med as of 09/2018 neuro f/u.   Shortness of breath    Tobacco dependence    "Quit" 1992, but pt has smoked "on and off" since that time    Past Surgical History: Past Surgical History:  Procedure Laterality Date   ABDOMINAL AORTIC ENDOVASCULAR STENT GRAFT N/A 05/07/2014   Procedure: ABDOMINAL AORTIC ENDOVASCULAR STENT GRAFT;  Surgeon: Serafina Mitchell, MD;  Location: [place] OR;  Service: Vascular;  Laterality: N/A;   Carotid duplex dopplers  07/2015   1-39% on R, no signif dz noted on L   COLONOSCOPY W/ POLYPECTOMY  2003    negative 2010,due 2020; Dr Olevia Perches   CORONARY ANGIOPLASTY  no stents   CYSTOSCOPY/RETROGRADE/URETEROSCOPY Bilateral 10/09/2012   Procedure: BILATERAL RETROGRADE bladder and urethral BIOPSY ;  Surgeon: Molli Hazard, MD;  Location: [facility];  Service: Urology;  Laterality: Bilateral;  BILATERAL RETROGRADE    EEG  08/05/15   Pt placed on keppra just prior to this test due to having ? seizure (MRI showed brain mets)   HERNIA REPAIR Bilateral    Inguinal   INGUINAL HERIIORRHAPHY BILATERALLY     IR ANGIOGRAM SELECTIVE EACH ADDITIONAL VESSEL  12/03/2017   IR ANGIOGRAM VISCERAL SELECTIVE  12/03/2017   IR ANGIOGRAM VISCERAL SELECTIVE  12/03/2017   IR EMBO ARTERIAL NOT HEMORR HEMANG INC GUIDE ROADMAPPING  12/03/2017   IR GENERIC HISTORICAL  07/20/2016   IR RADIOLOGIST EVAL & MGMT 07/20/2016 [facility] INTERV RAD   IR GENERIC HISTORICAL  08/02/2016   IR RADIOLOGIST EVAL & MGMT 08/02/2016 Sandi Mariscal, MD [facility] INTERV RAD   IR RADIOLOGIST EVAL & MGMT  11/08/2017   IR RADIOLOGIST EVAL & MGMT  01/01/2018   IR US GUIDE VASC ACCESS RIGHT  12/03/2017    MEDIASTINOSCOPY N/A 05/01/2013   Procedure: MEDIASTINOSCOPY;  Surgeon: Jordan Pollack, MD;  Location: [place] OR;  Service: Thoracic;  Laterality: N/A;   PFTs  04/2013   Minimal obstructive airway disease   PILONIDAL CYST EXCISION     PROSTATE BIOPSY N/A 10/09/2012   NEG bx 04/12/18 as well.  Procedure: PROSTATIC URETHRAL BIOPSY--BPH--no evidence of malignancy;  Surgeon: Molli Hazard, MD;  Location: [facility];  Service: Urology;  Laterality: N/A;  PROSTATIC URETHRAL BIOPSY   PROSTATE BIOPSY  03/2018   NEG   PTCA  1992   ROTATOR CUFF REPAIR     Bilateral   THOROCOTOMY WITH LOBECTOMY Left 05/29/2013   Procedure: LEFT THOROCOTOMY WITH LEFT UPPER LOBE LOBECTOMY;  Surgeon: Jordan Pollack, MD;  Location: [facility] OR;  Service: Thoracic;  Laterality: Left;   VIDEO BRONCHOSCOPY N/A 05/01/2013   Procedure: VIDEO BRONCHOSCOPY;  Surgeon: Jordan Pollack, MD;  Location: [facility] OR;  Service: Thoracic;  Laterality: N/A;    Social History:  Social History   Socioeconomic History   Marital status: Married    Spouse name: Not on file   Number of children: Not on file   Years of education: Not on file   Highest education level: Not on file  Occupational History   Occupation: Retired Doctor, general practice strain: Not on file   Food insecurity    Worry: Not on file    Inability: Not on file   Transportation needs    Medical: Not on file    Non-medical: Not on file  Tobacco Use   Smoking status: Current Every Day Smoker    Packs/day: 1.00    Years: 24.00    Pack years: 24.00    Types: Cigarettes    Last attempt to quit: 05/28/2013    Years since quitting: 6.1   Smokeless tobacco: Never Used   Tobacco comment: QUIT 15 YEARS AGO-07/03/17 smoking  Substance and Sexual Activity   Alcohol use: Yes    Alcohol/week: 15.0 standard drinks    Types: 3 Glasses of wine, 12 Cans of beer per week    Comment: 1-2 beers a day   Drug use: No   Sexual activity: Not on  file  Lifestyle   Physical activity    Days per week: Not on file    Minutes per session: Not on file   Stress: Not  on file  Relationships   Social connections    Talks on phone: Not on file    Gets together: Not on file    Attends religious service: Not on file    Active member of club or organization: Not on file    Attends meetings of clubs or organizations: Not on file    Relationship status: Not on file   Intimate partner violence    Fear of current or ex partner: Not on file    Emotionally abused: Not on file    Physically abused: Not on file    Forced sexual activity: Not on file  Other Topics Concern   Not on file  Social History Narrative   HEART HEALTHY DIET.     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   MARRIED, 2 children who live in [place].  Smoked on and off since then.   ALCOHOL USE -YES- RED WINE SOCIALLY, couple of beers at night usually.   07-08-18 Unable to ask abuse questions wife with him today.  The patient is from [place] originally. He enjoys flying planes but has not flown since his seizures. He does still do mechanical work on them.  He and his wife live in [place].  Family History: Family History  Problem Relation Age of Onset   Hypertension Mother    Prostate cancer Father        Prostate cancer   Cancer Father        Brain Tumor   Lung cancer Sister        NON SMOKER   Cancer Sister    Hyperlipidemia Sister    Hyperlipidemia Brother    Heart attack Brother    Hypertension Brother    Prostate cancer Brother    Diabetes Neg Hx    Stroke Neg Hx     ALLERGIES:  is allergic to fluvirin [influenza vac split quad]; influenza vaccines; quinolones; wellbutrin [bupropion]; rosuvastatin; and iodinated diagnostic agents.  Meds: Current Outpatient Medications  Medication Sig Dispense Refill   acetaminophen (TYLENOL 8 HOUR) 650 MG CR tablet Take 1 tablet (650 mg total) by mouth every 6 (six) hours as needed. 30  tablet 0   albuterol (PROVENTIL HFA;VENTOLIN HFA) 108 (90 Base) MCG/ACT inhaler Inhale 2 puffs into the lungs every 6 (six) hours as needed for wheezing or shortness of breath. 18 g 1   amLODipine (NORVASC) 5 MG tablet Take 1 tablet (5 mg total) by mouth daily. OFFICE VISIT NEEDED FOR FURTHER REFILLS. 90 tablet 1   aspirin EC 81 MG tablet Take 81 mg by mouth at bedtime.      atorvastatin (LIPITOR) 20 MG tablet Take 1 tablet (20 mg total) by mouth daily. 90 tablet 3   budesonide-formoterol (SYMBICORT) 160-4.5 MCG/ACT inhaler INHALE 1-2 PUFFS INTO THE LINGS EVERY 12 HOURS. GARGLE AND SPIT AFTER USE. 30.6 Inhaler 1   diphenhydrAMINE (BENADRYL) 50 MG tablet Take 1 tablet (50 mg total) by mouth as directed. Take 1 tablet (50 mg) by mouth 1 hr prior to CTA (scheduled for 11/08/2017) 1 tablet 3   Multiple Vitamin (MULTIVITAMIN WITH MINERALS) TABS tablet Take 1 tablet by mouth daily.     multivitamin (VIT W/EXTRA C) CHEW chewable tablet Chew by mouth.     pentoxifylline (TRENTAL) 400 MG CR tablet Take 1 tablet (400 mg total) by mouth 3 (three) times daily. 90 tablet 5   tamsulosin (FLOMAX) 0.4 MG CAPS capsule Take 0.4 mg by mouth daily.  lamoTRIgine (LAMICTAL) 200 MG tablet TAKE 1 TABLET BY MOUTH IN THE MORNING AND 1&1/2 TABLETS IN THE EVENING (Patient not taking: Reported on 07/07/2019) 216 tablet 1   metoprolol tartrate (LOPRESSOR) 50 MG tablet Take 1 tablet (50 mg total) by mouth 2 (two) times daily. (Patient not taking: Reported on 07/07/2019) 180 tablet 0   predniSONE (DELTASONE) 50 MG tablet TAKE 1 TABLET BY MOUTH 13 HOURS PRIOR TO SCAN, 7 HOURS PRIOR TO SCAN AND 1 HOUR PRIOR TO SCAN (Patient not taking: Reported on 07/07/2019) 3 tablet 3   No current facility-administered medications for this encounter.     Physical Findings: Unable to assess due to encounter type.  Lab Findings: Lab Results  Component Value Date   WBC 11.0 (H) 01/03/2019   HGB 15.3 01/03/2019   HCT 47.0  01/03/2019   MCV 87.4 01/03/2019   PLT 267 01/03/2019     Radiographic Findings: Mr Jeri Cos XL Contrast  Result Date: 07/03/2019 CLINICAL DATA:  Metastatic lung cancer post SRS. Creatinine was obtained on site at [facility] at [address] Results: Creatinine 1.1 mg/dL. EXAM: MRI HEAD WITHOUT AND WITH CONTRAST TECHNIQUE: Multiplanar, multiecho pulse sequences of the brain and surrounding structures were obtained without and with intravenous contrast. CONTRAST:  24mL MULTIHANCE GADOBENATE DIMEGLUMINE 529 MG/ML IV SOLN COMPARISON:  MRI head 01/02/2019 FINDINGS: Brain: Treated lesion in the right precentral gyrus is stable. This is cystic and measures 18 x 12 mm with hemosiderin. Surrounding white matter edema is stable. Treated lesion right parietal convexity is stable. This lesion shows mild enhancement and chronic hemorrhage. No new enhancing lesions identified. Ventricle size normal. Mild atrophy. Negative for acute infarct. Chronic microhemorrhage in the frontal and occipital lobes bilaterally is unchanged and not associated with tumor. Patchy white matter hyperintensities bilaterally without enhancement are stable. Vascular: Normal arterial flow voids. Skull and upper cervical spine: Negative Sinuses/Orbits: Mild mucosal edema paranasal sinuses. Bilateral cataract surgery. Other: None IMPRESSION: Two treated lesions on the right are stable. No new enhancing lesions identified. Electronically Signed   By: Franchot Gallo M.D.   On: 07/03/2019 15:19    Impression/Plan: 1. Recurrent Metastatic Stage IIIA, T2a, N2, M0, NSCLC, adenocarcinoma of the left upper lobe. Mr. Ostrand is doing very well.  The patient continues to do extremely well clinically.  He remains in observation with Dr. Julien Mccullough and with [place] in the brain program.  We reviewed his recent MRI scan, he is very pleased with the results, he also prefers to continue having visits by telephone throughout the pandemic.  We will plan  for repeat MRI in 6 months according to NCCN guidelines or sooner if he has symptoms or concerns.  2. Seizure at presentation.  He continues under the care of Dr. Delice Lesch, we will follow this expectantly.  3. Elevated PSA with BPH.  The patient continues with Dr. Lovena Mccullough at [facility] urology for evaluation of this.  He did have a negative biopsy in August 2019, he states that he will not have any additional biopsies in the future but I did encourage him to continue follow-up with his PSA levels.  4. Tobacco cessation.  The patient was counseled on the importance of tobacco cessation and given some additional tips on tapering, he will contact us if he has any other questions or concerns as well.  Given current concerns for patient exposure during the COVID-19 pandemic, this encounter was conducted via telephone.  The patient has given verbal consent for this type of  encounter. The time spent during this encounter was 10 minutes and 50% of that time was spent in the coordination of his care. The attendants for this meeting include Shona Simpson, [facility] and Jill Alexanders  During the encounter, Shona Simpson [facility] was located at home working remotely.  DEZMOND DOWNIE  was located at his home.     Carola Rhine, PAC

## 2019-07-10 ENCOUNTER — Encounter (HOSPITAL_COMMUNITY): Payer: Self-pay

## 2019-07-10 ENCOUNTER — Other Ambulatory Visit: Payer: Self-pay

## 2019-07-10 ENCOUNTER — Inpatient Hospital Stay: Payer: Medicare Other

## 2019-07-10 ENCOUNTER — Ambulatory Visit (HOSPITAL_COMMUNITY)
Admission: RE | Admit: 2019-07-10 | Discharge: 2019-07-10 | Disposition: A | Discharge status: Home / Self Care | Payer: Medicare Other | Source: Ambulatory Visit | Attending: Internal Medicine | Admitting: Internal Medicine

## 2019-07-10 DIAGNOSIS — C3412 Malignant neoplasm of upper lobe, left bronchus or lung: Secondary | ICD-10-CM | POA: Diagnosis not present

## 2019-07-10 DIAGNOSIS — C3490 Malignant neoplasm of unspecified part of unspecified bronchus or lung: Secondary | ICD-10-CM

## 2019-07-10 DIAGNOSIS — Z85118 Personal history of other malignant neoplasm of bronchus and lung: Secondary | ICD-10-CM | POA: Diagnosis not present

## 2019-07-10 DIAGNOSIS — J439 Emphysema, unspecified: Secondary | ICD-10-CM | POA: Diagnosis not present

## 2019-07-10 LAB — CBC WITH DIFFERENTIAL (CANCER CENTER ONLY)
Abs Immature Granulocytes: 0.04 10*3/uL (ref 0.00–0.07)
Basophils Absolute: 0 10*3/uL (ref 0.0–0.1)
Basophils Relative: 0 %
Eosinophils Absolute: 0 10*3/uL (ref 0.0–0.5)
Eosinophils Relative: 0 %
HCT: 47.4 % (ref 39.0–52.0)
Hemoglobin: 15.9 g/dL (ref 13.0–17.0)
Immature Granulocytes: 1 %
Lymphocytes Relative: 10 %
Lymphs Abs: 0.8 10*3/uL (ref 0.7–4.0)
MCH: 29.5 pg (ref 26.0–34.0)
MCHC: 33.5 g/dL (ref 30.0–36.0)
MCV: 87.9 fL (ref 80.0–100.0)
Monocytes Absolute: 0.1 10*3/uL (ref 0.1–1.0)
Monocytes Relative: 1 %
Neutro Abs: 7.3 10*3/uL (ref 1.7–7.7)
Neutrophils Relative %: 88 %
Platelet Count: 253 10*3/uL (ref 150–400)
RBC: 5.39 MIL/uL (ref 4.22–5.81)
RDW: 14.6 % (ref 11.5–15.5)
WBC Count: 8.2 10*3/uL (ref 4.0–10.5)
nRBC: 0 % (ref 0.0–0.2)

## 2019-07-10 LAB — CMP (CANCER CENTER ONLY)
ALT: 14 U/L (ref 0–44)
AST: 12 U/L — ABNORMAL LOW (ref 15–41)
Albumin: 4.2 g/dL (ref 3.5–5.0)
Alkaline Phosphatase: 78 U/L (ref 38–126)
Anion gap: 13 (ref 5–15)
BUN: 19 mg/dL (ref 8–23)
CO2: 21 mmol/L — ABNORMAL LOW (ref 22–32)
Calcium: 9.9 mg/dL (ref 8.9–10.3)
Chloride: 104 mmol/L (ref 98–111)
Creatinine: 1.04 mg/dL (ref 0.61–1.24)
GFR, Est AFR Am: >60 mL/min (ref >60)
GFR, Estimated: >60 mL/min (ref >60)
Glucose, Bld: 147 mg/dL — ABNORMAL HIGH (ref 70–99)
Potassium: 4.6 mmol/L (ref 3.5–5.1)
Sodium: 138 mmol/L (ref 135–145)
Total Bilirubin: 0.3 mg/dL (ref 0.3–1.2)
Total Protein: 7.8 g/dL (ref 6.5–8.1)

## 2019-07-10 MED ORDER — SODIUM CHLORIDE (PF) 0.9 % IJ SOLN
INTRAMUSCULAR | Status: AC
  Filled 2019-07-10: qty 50

## 2019-07-10 MED ORDER — IOHEXOL 300 MG/ML  SOLN
75.0000 mL | Freq: Once | INTRAMUSCULAR | Status: AC | PRN
  Administered 2019-07-10: 75 mL via INTRAVENOUS

## 2019-07-11 ENCOUNTER — Other Ambulatory Visit: Payer: Medicare Other

## 2019-07-11 ENCOUNTER — Telehealth: Payer: Self-pay | Admitting: Internal Medicine

## 2019-07-11 ENCOUNTER — Encounter: Payer: Self-pay | Admitting: Internal Medicine

## 2019-07-11 NOTE — Telephone Encounter (Signed)
Converted 11/23 appointment to mychart visit. Confirmed with patient.

## 2019-07-14 ENCOUNTER — Encounter: Payer: Self-pay | Admitting: Internal Medicine

## 2019-07-14 ENCOUNTER — Inpatient Hospital Stay (HOSPITAL_BASED_OUTPATIENT_CLINIC_OR_DEPARTMENT_OTHER): Payer: Medicare Other | Admitting: Internal Medicine

## 2019-07-14 DIAGNOSIS — C7931 Secondary malignant neoplasm of brain: Secondary | ICD-10-CM | POA: Diagnosis not present

## 2019-07-14 DIAGNOSIS — C3412 Malignant neoplasm of upper lobe, left bronchus or lung: Secondary | ICD-10-CM

## 2019-07-14 DIAGNOSIS — I10 Essential (primary) hypertension: Secondary | ICD-10-CM

## 2019-07-14 NOTE — Progress Notes (Signed)
[place] [phone]   [phone]  PROGRESS NOTE FOR TELEMEDICINE VISITS  Jordan Sou, MD [address] [place] [address]  I connected with@ on 07/14/19 at  3:30 PM EST by telephone visit and verified that I am speaking with the correct person using two identifiers.   I discussed the limitations, risks, security and privacy concerns of performing an evaluation and management service by telemedicine and the availability of in-person appointments. I also discussed with the patient that there may be a patient responsible charge related to this service. The patient expressed understanding and agreed to proceed.  Other persons participating in the visit and their role in the encounter:  None.  Patient's location:  Home Provider's location:  [place]  DIAGNOSIS: Metastatic non-small cell lung cancer initially diagnosed as Stage IIIA (T2a., N2, M0) non-small cell lung cancer adenocarcinoma with negative EGFR mutation and negative ALK gene translocation involving the left upper lobe middle mediastinal lymphadenopathy diagnosed in August of 2014. He had metastatic disease to the brain diagnosed in December 2016.  Lung cancer   Primary site: Lung (Left)   Staging method: AJCC 7th Edition   Clinical: Stage IIIA (T2a, N2, M0) signed by [name], MD on 06/19/2013  5:06 PM   Summary: Stage IIIA (T2a, N2, M0).  Molecular biomarkers: Positive for [id number]*7, [id number] ATM splice site [id number]>AC TP53 E271K, SETD2 [id number] and SPOP E47K                                          Negative for: RET, ALK, BRAF, KRAS, ERBB2, MET and EGFR.  PRIOR THERAPY:  1) Status post left upper lobectomy with mediastinal lymph node dissection on 05/29/2013. 2) Adjuvant chemotherapy with cisplatin at 75 mg per meter squared and Alimta at 500 mg per meter squared given every 3 weeks for a total of 4 cycles. First cycle given on  07/10/2013 3) curative radiotherapy to the mediastinum under the care of Dr. [name] on 12/12/2013. 4) status post stereotactic radiotherapy to the metastatic brain lesion on 08/25/2015 under the care of Dr. [name] CURRENT THERAPY: Observation.  INTERVAL HISTORY: Jordan [id number].o. male has a telephone virtual visit with me today for evaluation and discussion of his scan results.  The patient is feeling fine today with no concerning complaints.  He denied having any current chest pain, shortness breath, cough or hemoptysis.  He denied having any fever or chills.  He has no nausea, vomiting, diarrhea or constipation.  He has no headache or visual changes.  He had repeat CT scan of the chest as well as MRI of the brain performed recently and we are having the visit today for discussion of these results.  MEDICAL HISTORY: Past Medical History:  Diagnosis Date  . AAA (abdominal aortic aneurysm) (HCC)    s/p repair (aortoiliac bipass; with persistent endograft leak in inferior mesenteric artery)--stable as of 01/2017 vascular f/u.  . Jordan Mccullough cholelithiasis 07/2015   Incidental finding on PET CT  . Back pain 04/19/2016  . BPH with elevated PSA    PSA signif rise 11/2017 (5.4 in 2015 to 31.21 November 2017. Bx 03/2018 benign.  . Coronary artery disease    MI 1992, S/P  PTCA; negative stress test in November 2011 with no ischemia.   . Diverticulosis   . Elevated  PSA 01/2018   01/22/18:  PSA 26.5.  Prostate MRI with abnormality->subsequent prostate bx BENIGN 04/12/18.  Rpt PSA 03/07/18 stable at 24.1-->f/u 1 yr with urologist.  . Jordan Mccullough hematuria 05/2018   Occurred 2+ mo's after prostate bx: attributed to bx by urol, treated empirically with keflex.  Urol initially suspected gross hem was sequela of recent prost bx. BUT recurrence of gross hem prompted full hematuria w/u 11/15/18.  Jordan Kitchen Hearing loss    Bilateral   . History of radiation therapy 11/10/13-12/12/13   lung,50Gy/41f  .  Hyperlipidemia    Crestor= myalgias  . Hypertension   . Lung cancer ([facility] 05/2013   non-small cell;  L upper lobectomy with mediastinal LN dissection, chemo, and radiation in 2014/2015. Remission until pt had questionable seizure 07/2015--MRI showed brain mets; palliative brain rad (stereotactic radiation therapy) started 08/25/15.  CT C/A/P clear 02/2016, 05/2016, 11/2016, 12/2018. MRI brain 12/2018 stable lesions/no new mets. Rpt 6 mo per onc.  . Malignant [place] of upper lobe, left bronchus or lung ([facility] 10/29/2013   *?[facility] 4.5cm [facility]  Dr. MJulien Mccullough(onc) recommended f/u CXR after abx course.  If mass unchanged then repeat CT chest.  Surveillance CT chest and MRI brain 06/2018 show no sign of dz.  . Myocardial infarction ([facility] 1992   Dr MAngelena Mccullough   . Peripheral vascular disease ([facility] 08[facility]2012   4.8x4.6 cm infrarenal abdominal aortic fusiform aneurysm, 1.5 cm right common iliac artery aneurysm.  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  .Jordan KitchenPneumonia 09/06/2018   [facility]  [facility]   Stereotactic radiation therapy: frontoparietal 18gy,posterior frontal lobe 20gy,  . Seizure disorder ([facility] 2016[facility]2017   as sequela of brain mets;  Grand mal seizure 07/2015, then got on keppra and was seizure-free until focal motor seizures of L side of face began 02/2016- these responded well to up-titration of keppra but pt had adverse side effects so eventually pt had to be switched over to lamictal 07/2016--stable/seizure free on this med as of 09/2018 neuro f/u.  .Jordan KitchenShortness of breath   . Tobacco dependence    "Quit" 1992, but pt has smoked "on and off" since that time    ALLERGIES:  is allergic to fluvirin [influenza vac split quad]; influenza vaccines; quinolones; wellbutrin  [bupropion]; rosuvastatin; and iodinated diagnostic agents.  MEDICATIONS:  Current Outpatient Medications  Medication Sig Dispense Refill  . acetaminophen (TYLENOL 8 HOUR) 650 MG CR tablet Take 1 tablet (650 mg total) by mouth every 6 (six) hours as needed. 30 tablet 0  . albuterol (PROVENTIL HFA;VENTOLIN HFA) 108 (90 Base) MCG/ACT inhaler Inhale 2 puffs into the lungs every 6 (six) hours as needed for wheezing or shortness of breath. 18 g 1  . amLODipine (NORVASC) 5 MG tablet Take 1 tablet (5 mg total) by mouth daily. OFFICE VISIT NEEDED FOR FURTHER REFILLS. 90 tablet 1  . aspirin EC 81 MG tablet Take 81 mg by mouth at bedtime.     .Jordan Kitchenatorvastatin (LIPITOR) 20 MG tablet Take 1 tablet (20 mg total) by mouth daily. 90 tablet 3  . budesonide-formoterol (SYMBICORT) 160-4.5 MCG/ACT inhaler INHALE 1-2 PUFFS INTO THE LINGS EVERY 12 HOURS. GARGLE AND SPIT AFTER USE. 30.6 Inhaler 1  . diphenhydrAMINE (BENADRYL) 50 MG tablet Take 1  tablet (50 mg total) by mouth as directed. Take 1 tablet (50 mg) by mouth 1 hr prior to CTA (scheduled for 11/08/2017) 1 tablet 3  . lamoTRIgine (LAMICTAL) 200 MG tablet TAKE 1 TABLET BY MOUTH IN THE MORNING AND 1&1/2 TABLETS IN THE EVENING (Patient not taking: Reported on 07/07/2019) 216 tablet 1  . metoprolol tartrate (LOPRESSOR) 50 MG tablet Take 1 tablet (50 mg total) by mouth 2 (two) times daily. (Patient not taking: Reported on 07/07/2019) 180 tablet 0  . Multiple Vitamin (MULTIVITAMIN WITH MINERALS) TABS tablet Take 1 tablet by mouth daily.    . multivitamin (VIT W/EXTRA C) CHEW chewable tablet Chew by mouth.    . pentoxifylline (TRENTAL) 400 MG CR tablet Take 1 tablet (400 mg total) by mouth 3 (three) times daily. 90 tablet 5  . predniSONE (DELTASONE) 50 MG tablet TAKE 1 TABLET BY MOUTH 13 HOURS PRIOR TO SCAN, 7 HOURS PRIOR TO SCAN AND 1 HOUR PRIOR TO SCAN (Patient not taking: Reported on 07/07/2019) 3 tablet 3  . tamsulosin (FLOMAX) 0.4 MG CAPS capsule Take 0.4 mg by mouth  daily.     No current facility-administered medications for this visit.     SURGICAL HISTORY:  Past Surgical History:  Procedure Laterality Date  . ABDOMINAL AORTIC ENDOVASCULAR STENT GRAFT N/A 05/07/2014   Procedure: ABDOMINAL AORTIC ENDOVASCULAR STENT GRAFT;  Surgeon: Serafina Mitchell, MD;  Location: [facility];  Service: Vascular;  Laterality: N/A;  . Carotid duplex dopplers  07/2015   1-39% on R, no signif dz noted on L  . COLONOSCOPY W/ POLYPECTOMY  2003    negative 2010,due 2020; Dr Olevia Perches  . CORONARY ANGIOPLASTY     no stents  . CYSTOSCOPY/RETROGRADE/URETEROSCOPY Bilateral 10/09/2012   Procedure: BILATERAL RETROGRADE bladder and urethral BIOPSY ;  Surgeon: Molli Hazard, MD;  Location: [facility];  Service: Urology;  Laterality: Bilateral;  BILATERAL RETROGRADE   . EEG  08/05/15   Pt placed on keppra just prior to this test due to having ? seizure (MRI showed brain mets)  . HERNIA REPAIR Bilateral    Inguinal  . INGUINAL HERIIORRHAPHY BILATERALLY    . IR ANGIOGRAM SELECTIVE EACH ADDITIONAL VESSEL  12/03/2017  . IR ANGIOGRAM VISCERAL SELECTIVE  12/03/2017  . IR ANGIOGRAM VISCERAL SELECTIVE  12/03/2017  . IR EMBO ARTERIAL NOT HEMORR HEMANG INC GUIDE ROADMAPPING  12/03/2017  . IR GENERIC HISTORICAL  07/20/2016   IR RADIOLOGIST EVAL & MGMT 07/20/2016 [facility] INTERV RAD  . IR GENERIC HISTORICAL  08/02/2016   IR RADIOLOGIST EVAL & MGMT 08/02/2016 Sandi Mariscal, MD [facility] INTERV RAD  . IR RADIOLOGIST EVAL & MGMT  11/08/2017  . IR RADIOLOGIST EVAL & MGMT  01/01/2018  . IR US GUIDE VASC ACCESS RIGHT  12/03/2017  . MEDIASTINOSCOPY N/A 05/01/2013   Procedure: MEDIASTINOSCOPY;  Surgeon: Gaye Pollack, MD;  Location[facility];  Service: Thoracic;  Laterality: N/A;  . PFTs  04/2013   Minimal obstructive airway disease  . PILONIDAL CYST EXCISION    . PROSTATE BIOPSY N/A 10/09/2012   NEG bx 04/12/18 as well.  Procedure: PROSTATIC URETHRAL BIOPSY--BPH--no evidence of malignancy;  Surgeon: Molli Hazard, MD;  Location: [facility];  Service: Urology;  Laterality: N/A;  PROSTATIC URETHRAL BIOPSY  . PROSTATE BIOPSY  03/2018   NEG  . PTCA  1992  . ROTATOR CUFF REPAIR     Bilateral  . THOROCOTOMY WITH LOBECTOMY Left 05/29/2013   Procedure: LEFT THOROCOTOMY WITH LEFT UPPER LOBE LOBECTOMY;  Surgeon: Gaye Pollack, MD;  Location: [place] [facility];  Service: Thoracic;  Laterality: Left;  Jordan Kitchen VIDEO BRONCHOSCOPY N/A 05/01/2013   Procedure: VIDEO BRONCHOSCOPY;  Surgeon: Gaye Pollack, MD;  [facility];  Service: Thoracic;  Laterality: N/A;    REVIEW OF SYSTEMS:  A comprehensive review of systems was negative.    LABORATORY DATA: Lab Results  Component Value Date   WBC 8.2 07/10/2019   HGB 15.9 07/10/2019   HCT 47.4 07/10/2019   MCV 87.9 07/10/2019   PLT 253 07/10/2019      Chemistry      Component Value Date/Time   NA 138 07/10/2019 1037   NA 141 06/18/2017 1006   K 4.6 07/10/2019 1037   K 5.3 (H) 06/18/2017 1006   CL 104 07/10/2019 1037   CO2 21 (L) 07/10/2019 1037   CO2 25 06/18/2017 1006   BUN 19 07/10/2019 1037   BUN 14.3 06/18/2017 1006   CREATININE 1.04 07/10/2019 1037   CREATININE 0.9 06/18/2017 1006      Component Value Date/Time   CALCIUM 9.9 07/10/2019 1037   CALCIUM 10.3 06/18/2017 1006   ALKPHOS 78 07/10/2019 1037   ALKPHOS 63 06/18/2017 1006   AST 12 (L) 07/10/2019 1037   AST 15 06/18/2017 1006   ALT 14 07/10/2019 1037   ALT 17 06/18/2017 1006   BILITOT 0.3 07/10/2019 1037   BILITOT 0.35 06/18/2017 1006       RADIOGRAPHIC STUDIES: Ct Chest W Contrast  Result Date: 07/10/2019 CLINICAL DATA:  Patient with history of lung cancer. Follow-up exam. EXAM: CT CHEST WITH CONTRAST TECHNIQUE: Multidetector CT imaging of the chest was performed during intravenous contrast administration. CONTRAST:  64m OMNIPAQUE IOHEXOL 300 MG/ML  SOLN COMPARISON:  CT CAP 01/03/2019 FINDINGS: Cardiovascular: Heart is mildly enlarged. Small pericardial effusion. Thoracic aortic vascular  calcifications. Coronary arterial vascular calcifications. Mediastinum/Nodes: No enlarged axillary, mediastinal or hilar lymphadenopathy. Postsurgical changes within the left perihilar region. Lungs/Pleura: Patient status post left upper lobectomy. Stable postsurgical changes within the left hemithorax. Stable post radiation changes left perihilar region. No large area pulmonary consolidation. No new or enlarging pulmonary nodules. No pleural effusion or pneumothorax. Centrilobular and paraseptal emphysematous changes right upper lobe. Upper Abdomen: Normal adrenal glands. Stable partially visualized cyst superior pole left kidney. No acute process. Musculoskeletal: Thoracic spine degenerative changes. No aggressive or acute appearing osseous lesions. Postsurgical changes lateral left ribs. Interval subacute left lateral healing rib fractures IMPRESSION: Stable post treatment changes left hemithorax. No evidence for recurrent metastatic disease within the chest. Findings suggestive of multiple subacute healing left lateral rib fractures. Aortic Atherosclerosis (ICD10-I70.0) and Emphysema (ICD10-J43.9). Electronically Signed   By: DLovey NewcomerM.D.   On: 07/10/2019 13:30   Mr BJeri CosWOZContrast  Result Date: 07/03/2019 CLINICAL DATA:  Metastatic lung cancer post SRS. [facility] [address] Results: Creatinine 1.1 mg/dL. EXAM: MRI HEAD WITHOUT AND WITH CONTRAST TECHNIQUE: Multiplanar, multiecho pulse sequences of the brain and surrounding structures were obtained without and with intravenous contrast. CONTRAST:  177mMULTIHANCE GADOBENATE DIMEGLUMINE 529 MG/ML IV SOLN COMPARISON:  MRI head 01/02/2019 FINDINGS: Brain: Treated lesion in the right precentral gyrus is stable. This is cystic and measures 18 x 12 mm with hemosiderin. Surrounding white matter edema is stable. Treated lesion right parietal convexity is stable. This lesion shows mild enhancement and  chronic hemorrhage. No new enhancing lesions identified. Ventricle size normal. Mild atrophy. Negative for acute infarct.  Chronic microhemorrhage in the frontal and occipital lobes bilaterally is unchanged and not associated with tumor. Patchy white matter hyperintensities bilaterally without enhancement are stable. Vascular: Normal arterial flow voids. Skull and upper cervical spine: Negative Sinuses/Orbits: Mild mucosal edema paranasal sinuses. Bilateral cataract surgery. Other: None IMPRESSION: Two treated lesions on the right are stable. No new enhancing lesions identified. Electronically Signed   By: Franchot Gallo M.D.   On: 07/03/2019 15:19    ASSESSMENT AND PLAN: This is a very pleasant 78 years old white male with metastatic non-small cell lung cancer that was initially diagnosed as a stage IIIa adenocarcinoma status post left upper lobectomy with mediastinal lymph node dissection followed by adjuvant systemic chemotherapy with cisplatin and Alimta. The patient also received stereotactic radiotherapy to metastatic brain lesions in January 2017. The patient has been on observation since that time and he is feeling fine today with no concerning complaints. He had repeat CT scan of the chest as well as MRI of the brain performed recently.  I personally and independently reviewed the scans and discussed the results with the patient today. His scan showed no concerning findings for disease progression or metastasis. I recommended for the patient to continue on observation with repeat CT scan of the chest in 6 months. He was advised to call immediately if he has any concerning symptoms in the interval. I discussed the assessment and treatment plan with the patient. The patient was provided an opportunity to ask questions and all were answered. The patient agreed with the plan and demonstrated an understanding of the instructions.   The patient was advised to call back or seek an in-person evaluation  if the symptoms worsen or if the condition fails to improve as anticipated.  I provided 12 minutes of non face-to-face telephone visit time during this encounter, and > 50% was spent counseling as documented under my assessment & plan.  Eilleen Kempf, MD 07/14/2019 3:34 PM  Disclaimer: This note was dictated with voice recognition software. Similar sounding words can inadvertently be transcribed and may not be corrected upon review.

## 2019-07-28 ENCOUNTER — Other Ambulatory Visit: Payer: Self-pay | Admitting: Otolaryngology

## 2019-07-31 ENCOUNTER — Encounter (HOSPITAL_COMMUNITY): Payer: Self-pay | Admitting: Physician Assistant

## 2019-08-01 ENCOUNTER — Other Ambulatory Visit: Payer: Self-pay | Admitting: Family Medicine

## 2019-08-01 MED ORDER — METOPROLOL TARTRATE 50 MG PO TABS: 50.0000 mg | ORAL_TABLET | Freq: Two times a day (BID) | ORAL | 0 refills | Status: DC

## 2019-08-04 ENCOUNTER — Other Ambulatory Visit (HOSPITAL_COMMUNITY)
Admission: RE | Admit: 2019-08-04 | Discharge: 2019-08-04 | Disposition: A | Discharge status: Home / Self Care | Payer: Medicare Other | Source: Ambulatory Visit | Attending: Otolaryngology | Admitting: Otolaryngology

## 2019-08-04 DIAGNOSIS — Z01812 Encounter for preprocedural laboratory examination: Secondary | ICD-10-CM | POA: Diagnosis not present

## 2019-08-04 DIAGNOSIS — Z20828 Contact with and (suspected) exposure to other viral communicable diseases: Secondary | ICD-10-CM | POA: Insufficient documentation

## 2019-08-04 LAB — SARS CORONAVIRUS 2 (TAT 6-24 HRS): SARS Coronavirus 2: NEGATIVE

## 2019-08-05 ENCOUNTER — Encounter: Payer: Self-pay | Admitting: Family Medicine

## 2019-08-05 NOTE — Progress Notes (Signed)
Anesthesia Chart Review: SDW   Case: [id number] Date/Time: 08/06/19 1115   Procedure: MICROLARYNGOSCOPY WITH VOCAL CORD INJECTION Prolaryn with jet ventilation (N/A )   Anesthesia type: General   Pre-op diagnosis: vocal cord paralysis   Location: [facility] OR ROOM 09 / [facility]   Surgeons: Melida Quitter, MD      DISCUSSION:  Hx of CAD s/p MI in 1992 with PTCA. Per notes in Epic it does not appear he is currently being followed by cardiology. Most recent echo 08/05/15 showed EF 50-55%, hypokinesis of inferior myocardium, grade 1dd, mild AR.  Hx of metastatic lung CA s/p left upper lobectomy with mediastinal lymph node dissection followed by adjuvant systemic chemotherapy with cisplatin and Alimta. The patient also received stereotactic radiotherapy to metastatic brain lesions in January 2017. Last seen by Dr. Julien Nordmann 07/14/19, no active disease at that time, advised to f/u in 6 months.  Hx of  AAA s/p endovascular aneurysm repair on 05/07/2014.He is followed by Dr. 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 CT angio done 11/08/17 showed stable type II endoleak with aneurysm sac diameter 5.8cm. Most recent abdominal CT done 01/03/19 for cancer surveillance showed anureysm sac to be 6.0cm. Pt has not been seen by vascular.  Discussed case with Dr. Christella Hartigan and Dr. Nyoka Cowden. Advised that pt should have clearance from vascular before proceeding with elective surgery. Dr. Redmond Baseman' office notified.    VS: There were no vitals taken for this visit.  PROVIDERS: McGowen, Adrian Blackwater, MD is PCP  Harold Barban, MD is Vascular surgeon   LABS: Will need DOS labs (all labs ordered are listed, but only abnormal results are displayed)  Labs Reviewed - No data to display   IMAGES: CT Chest 07/10/19: IMPRESSION: Stable post treatment changes left  hemithorax. No evidence for recurrent metastatic disease within the chest.  Findings suggestive of multiple subacute healing left lateral rib fractures.  Aortic Atherosclerosis (ICD10-I70.0) and Emphysema (ICD10-J43.9).  CTA C/A/P 01/03/19: IMPRESSION: Status post left upper lobectomy with radiation changes in the left hemithorax.  No evidence of recurrent or metastatic disease in the chest, abdomen, or pelvis.  6.0 cm infrarenal abdominal aortic aneurysm with indwelling aorto bi-iliac stent.  Additional stable ancillary findings in the abdomen/pelvis, unchanged from recent CT.   EKG: EKG 08/05/15: Sinus tach, rate 1010, LAD, probable anteroseptal infarct, old, ST elevation, consider inferior injury, no significant changes per notation on scanned copy.   CV: TTE 08/05/15: - Left ventricle: The cavity size was normal. There was mild   concentric hypertrophy. Systolic function was normal. The   estimated ejection fraction was in the range of 50% to 55%.   Hypokinesis of the inferior myocardium. Doppler parameters are   consistent with abnormal left ventricular relaxation (grade 1   diastolic dysfunction). - Aortic valve: There was mild regurgitation. - Left atrium: The atrium was moderately dilated. - Pericardium, extracardiac: A small pericardial effusion was   identified circumferential to the heart.  Past Medical History:  Diagnosis Date  . AAA (abdominal aortic aneurysm) (HCC)    s/p repair (aortoiliac bipass; with persistent endograft leak in inferior mesenteric artery)--stable as of 01/2017 vascular f/u.  Marland Kitchen Asymptomatic cholelithiasis 07/2015   Incidental finding on PET CT  . Back pain 04/19/2016  . BPH with elevated PSA    PSA signif rise 11/2017 (  5.4 in 2015 to 31.21 November 2017. Bx 03/2018 benign.  . Coronary artery disease    MI 1992, S/P  PTCA; negative stress test in November 2011 with no ischemia.   . Diverticulosis   . Elevated PSA 01/2018   01/22/18:  PSA  26.5.  Prostate MRI with abnormality->subsequent prostate bx BENIGN 04/12/18.  Rpt PSA 03/07/18 stable at 24.1-->f/u 1 yr with urologist.  . [name] hematuria 05/2018   Occurred 2+ mo's after prostate bx: attributed to bx by urol, treated empirically with keflex.  Urol initially suspected gross hem was sequela of recent prost bx. BUT recurrence of gross hem prompted full hematuria w/u 11/15/18.  Marland Kitchen Hearing loss    Bilateral   . History of radiation therapy 11/10/13-12/12/13   lung,50Gy/67fx  . Hyperlipidemia    Crestor= myalgias  . Hypertension   . Lung cancer ([place]) 05/2013   non-small cell;  L upper lobectomy with mediastinal LN dissection, chemo, and radiation in 2014/2015. Remission until pt had questionable seizure 07/2015--MRI showed brain mets; palliative brain rad (stereotactic radiation therapy) started 08/25/15.  CT C/A/P clear 02/2016, 05/2016, 11/2016, 12/2018. MRI brain 12/2018 stable lesions/no new mets. Rpt 6 mo per onc.  . Malignant neoplasm of upper lobe, left bronchus or lung ([place]) 10/29/2013   *?New spiculated 4.5cm mass in L upper lung field on CXR at [facility] in [place] 07/14/17.  Dr. Julien Nordmann (onc) recommended f/u CXR after abx course.  If mass unchanged then repeat CT chest.  Surveillance CT chest and MRI brain 06/2018 show no sign of dz.  . Myocardial infarction [facility]) 1992   Dr Angelena Form    . Peripheral vascular disease ([place]) 08/2012   4.8x4.6 cm infrarenal abdominal aortic fusiform aneurysm, 1.5 cm right common iliac artery aneurysm.  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  Marland Kitchen Pneumonia 09/06/2018   XRAY was done at [place] [facility].  . S/P radiation therapy [facility] 08/25/15   Stereotactic radiation therapy: frontoparietal 18gy,posterior frontal lobe 20gy,  . Seizure disorder ([place]) 2016/2017   as sequela of brain mets;  Grand mal seizure 07/2015, then got on keppra and  was seizure-free until focal motor seizures of L side of face began 02/2016- these responded well to up-titration of keppra but pt had adverse side effects so eventually pt had to be switched over to lamictal 07/2016--stable/seizure free on this med as of 09/2018 neuro f/u.  Marland Kitchen Shortness of breath   . Tobacco dependence    "Quit" 1992, but pt has smoked "on and off" since that time    Past Surgical History:  Procedure Laterality Date  . ABDOMINAL AORTIC ENDOVASCULAR STENT GRAFT N/A 05/07/2014   Procedure: ABDOMINAL AORTIC ENDOVASCULAR STENT GRAFT;  Surgeon: Serafina Mitchell, MD;  Location: [place] [facility];  Service: Vascular;  Laterality: N/A;  . Carotid duplex dopplers  07/2015   1-39% on R, no signif dz noted on L  . COLONOSCOPY W/ POLYPECTOMY  2003    negative 2010,due 2020; Dr Olevia Perches  . CORONARY ANGIOPLASTY     no stents  . CYSTOSCOPY/RETROGRADE/URETEROSCOPY Bilateral 10/09/2012   Procedure: BILATERAL RETROGRADE bladder and urethral BIOPSY ;  Surgeon: Molli Hazard, MD;  Location: [facility];  Service: Urology;  Laterality: Bilateral;  BILATERAL RETROGRADE   . EEG  08/05/15   Pt placed on keppra just prior to this test due to having ? seizure (MRI showed brain mets)  .  HERNIA REPAIR Bilateral    Inguinal  . INGUINAL HERIIORRHAPHY BILATERALLY    . IR ANGIOGRAM SELECTIVE EACH ADDITIONAL VESSEL  12/03/2017  . IR ANGIOGRAM VISCERAL SELECTIVE  12/03/2017  . IR ANGIOGRAM VISCERAL SELECTIVE  12/03/2017  . IR EMBO ARTERIAL NOT HEMORR HEMANG INC GUIDE ROADMAPPING  12/03/2017  . IR GENERIC HISTORICAL  07/20/2016   IR RADIOLOGIST EVAL & MGMT 07/20/2016 [facility] INTERV RAD  . IR GENERIC HISTORICAL  08/02/2016   IR RADIOLOGIST EVAL & MGMT 08/02/2016 Sandi Mariscal, MD [facility] INTERV RAD  . IR RADIOLOGIST EVAL & MGMT  11/08/2017  . IR RADIOLOGIST EVAL & MGMT  01/01/2018  . IR US GUIDE VASC ACCESS RIGHT  12/03/2017  . MEDIASTINOSCOPY N/A 05/01/2013   Procedure: MEDIASTINOSCOPY;  Surgeon: Gaye Pollack, MD;  Location:  [facility] OR;  Service: Thoracic;  Laterality: N/A;  . PFTs  04/2013   Minimal obstructive airway disease  . PILONIDAL CYST EXCISION    . PROSTATE BIOPSY N/A 10/09/2012   NEG bx 04/12/18 as well.  Procedure: PROSTATIC URETHRAL BIOPSY--BPH--no evidence of malignancy;  Surgeon: Molli Hazard, MD;  Location: [facility];  Service: Urology;  Laterality: N/A;  PROSTATIC URETHRAL BIOPSY  . PROSTATE BIOPSY  03/2018   NEG  . PTCA  1992  . ROTATOR CUFF REPAIR     Bilateral  . THOROCOTOMY WITH LOBECTOMY Left 05/29/2013   Procedure: LEFT THOROCOTOMY WITH LEFT UPPER LOBE LOBECTOMY;  Surgeon: Gaye Pollack, MD;  Location: [place];  Service: Thoracic;  Laterality: Left;  Marland Kitchen VIDEO BRONCHOSCOPY N/A 05/01/2013   Procedure: VIDEO BRONCHOSCOPY;  Surgeon: Gaye Pollack, MD;  Location: [facility] OR;  Service: Thoracic;  Laterality: N/A;    MEDICATIONS: No current facility-administered medications for this encounter.   Marland Kitchen amLODipine (NORVASC) 5 MG tablet  . aspirin EC 81 MG tablet  . atorvastatin (LIPITOR) 20 MG tablet  . budesonide-formoterol (SYMBICORT) 160-4.5 MCG/ACT inhaler  . lamoTRIgine (LAMICTAL) 200 MG tablet  . magnesium oxide (MAG-OX) 400 MG tablet  . Multiple Vitamin (MULTIVITAMIN WITH MINERALS) TABS tablet  . tamsulosin (FLOMAX) 0.4 MG CAPS capsule  . albuterol (PROVENTIL HFA;VENTOLIN HFA) 108 (90 Base) MCG/ACT inhaler  . diphenhydrAMINE (BENADRYL) 50 MG tablet  . metoprolol tartrate (LOPRESSOR) 50 MG tablet  . metoprolol tartrate (LOPRESSOR) 50 MG tablet  . predniSONE (DELTASONE) 50 MG tablet     Wynonia Musty [facility] Stay Center/Anesthesiology Phone [phone] 08/05/2019 12:48 PM

## 2019-08-06 ENCOUNTER — Ambulatory Visit (HOSPITAL_COMMUNITY): Admission: RE | Admit: 2019-08-06 | Payer: Medicare Other | Source: Home / Self Care | Admitting: Otolaryngology

## 2019-08-06 ENCOUNTER — Encounter (HOSPITAL_COMMUNITY): Admission: RE | Payer: Self-pay | Source: Home / Self Care

## 2019-08-06 SURGERY — MICROLARYNGOSCOPY, WITH VOCAL CORD INJECTION
Anesthesia: General

## 2019-08-07 ENCOUNTER — Other Ambulatory Visit: Payer: Self-pay

## 2019-08-07 ENCOUNTER — Encounter: Payer: Self-pay | Admitting: Family Medicine

## 2019-08-07 ENCOUNTER — Ambulatory Visit (INDEPENDENT_AMBULATORY_CARE_PROVIDER_SITE_OTHER): Payer: Medicare Other | Admitting: Family Medicine

## 2019-08-07 VITALS — BP 165/72 | HR 71 | Temp 98.6°F | Resp 18 | Wt 140.1 lb

## 2019-08-07 DIAGNOSIS — I2510 Atherosclerotic heart disease of native coronary artery without angina pectoris: Secondary | ICD-10-CM

## 2019-08-07 DIAGNOSIS — Z9889 Other specified postprocedural states: Secondary | ICD-10-CM | POA: Diagnosis not present

## 2019-08-07 DIAGNOSIS — I10 Essential (primary) hypertension: Secondary | ICD-10-CM

## 2019-08-07 DIAGNOSIS — I2583 Coronary atherosclerosis due to lipid rich plaque: Secondary | ICD-10-CM | POA: Diagnosis not present

## 2019-08-07 DIAGNOSIS — Z01818 Encounter for other preprocedural examination: Secondary | ICD-10-CM | POA: Diagnosis not present

## 2019-08-07 DIAGNOSIS — T82330D Leakage of aortic (bifurcation) graft (replacement), subsequent encounter: Secondary | ICD-10-CM

## 2019-08-07 DIAGNOSIS — C3490 Malignant neoplasm of unspecified part of unspecified bronchus or lung: Secondary | ICD-10-CM

## 2019-08-07 DIAGNOSIS — J383 Other diseases of vocal cords: Secondary | ICD-10-CM

## 2019-08-07 DIAGNOSIS — C7931 Secondary malignant neoplasm of brain: Secondary | ICD-10-CM

## 2019-08-07 DIAGNOSIS — R490 Dysphonia: Secondary | ICD-10-CM

## 2019-08-07 NOTE — Patient Instructions (Signed)
Contact Dr. Sandi Mariscal, your interventional radiologist, to make a follow up appt and you will need to get re-imaging of your abdominal aneurism that was coiled. Phone: [phone]. You must do this prior to getting cleared for surgery.

## 2019-08-07 NOTE — Progress Notes (Signed)
Office Note 08/07/2019  CC:  Chief Complaint  Patient presents with  . surgical clearance    HPI:  Jordan Mccullough is a 78 y.o. White male who is here unaccompanied for "surgical clearance".  He has chronic hoarseness, is going to get vocal cord surgery by Dr. Redmond Baseman.  Likely to be done 08/2019. He quit smoking about 2 mo ago. He has no SOB.  He swims some and walks his dog for 30 min or so and gets no DOE, CP, diaphoresis, arm pain, jaw pain, palpitations, or dizziness.  No severe systemic dz, no known pulm HTN.  Home bp monitoring: yesterday 132/81.  Usually bp consistently <130/80. Compliant with meds.  He has hx of inferior MI in 1992 and had PTCA.  Nov 2011 now risk stress test.  LV systolic function preserved, most recent echo 08/05/15 showed EF 50-55%, hypokinesis of inferior myocardium, and grd I DD, mild MR, mod dil of L atrium. No hx of ICD/pacer or pulm HTN. He does have hx of lung ca and was found to have brain met 07/2015, after which he got stereotactic radiation therapy.  F/u imaging as recent as 12/2018 showed stability, no new lesions. He does have seizure disorder related to these brain lesions but this has been very well controlled with lamictal under the monitoring of his neurologist.  His chronic illnesses of HTN and HLD have been well controlled. He has no renal or liver dysfunction.  He does have hx of AAA which required endograft stent in 2015.  This has had a persistent endograft leak which had been fairly stable with periodic imaging.  However, per CV surgeon's recommendation, IR saw the patient and did a coil embolization of this on 12/03/17 which was successful. However, pt did not f/u as directed for office revisit OR with plan for re-imaging that was to be done 3-6 mo after the procedure.  Pertinent imaging: 07/03/19: CLINICAL DATA:  Metastatic lung cancer post SRS.  EXAM: MRI HEAD WITHOUT AND WITH CONTRAST  TECHNIQUE: Multiplanar, multiecho pulse  sequences of the brain and surrounding structures were obtained without and with intravenous contrast.  CONTRAST:  10m MULTIHANCE GADOBENATE DIMEGLUMINE 529 MG/ML IV SOLN  COMPARISON:  MRI head 01/02/2019  FINDINGS: Brain: Treated lesion in the right precentral gyrus is stable. This is cystic and measures 18 x 12 mm with hemosiderin. Surrounding white matter edema is stable.  Treated lesion right parietal convexity is stable. This lesion shows mild enhancement and chronic hemorrhage.  No new enhancing lesions identified.  Ventricle size normal. Mild atrophy. Negative for acute infarct. Chronic microhemorrhage in the frontal and occipital lobes bilaterally is unchanged and not associated with tumor.  Patchy white matter hyperintensities bilaterally without enhancement are stable.  Vascular: Normal arterial flow voids.  Skull and upper cervical spine: Negative  Sinuses/Orbits: Mild mucosal edema paranasal sinuses. Bilateral cataract surgery.  Other: None  IMPRESSION: Two treated lesions on the right are stable. No new enhancing lesions identified.  07/10/19 CT chest w/contrast: IMPRESSION: Stable post treatment changes left hemithorax. No evidence for recurrent metastatic disease within the chest.  Findings suggestive of multiple subacute healing left lateral rib fractures.  Aortic Atherosclerosis (ICD10-I70.0) and Emphysema (ICD10-J43.9).  Past Medical History:  Diagnosis Date  . AAA (abdominal aortic aneurysm) (HCC)    s/p repair (aortoiliac bipass; with persistent endograft leak in inferior mesenteric artery)--stable as of 01/2017 vascular f/u.  .Marland KitchenAsymptomatic cholelithiasis 07/2015   Incidental finding on PET CT  . Back pain 04/19/2016  .  BPH with elevated PSA    PSA signif rise 11/2017 (5.4 in 2015 to 31.21 November 2017. Bx 03/2018 benign.  . Coronary artery disease    MI 1992, S/P  PTCA; negative stress test in November 2011 with no ischemia.    . Diverticulosis   . Elevated PSA 01/2018   01/22/18:  PSA 26.5.  Prostate MRI with abnormality->subsequent prostate bx BENIGN 04/12/18.  Rpt PSA 03/07/18 stable at 24.1-->f/u 1 yr with urologist.  . [name] hematuria 05/2018   Occurred 2+ mo's after prostate bx: attributed to bx by urol, treated empirically with keflex.  Urol initially suspected gross hem was sequela of recent prost bx. BUT recurrence of gross hem prompted full hematuria w/u 11/15/18.  Marland Kitchen Hearing loss    Bilateral   . History of radiation therapy 11/10/13-12/12/13   lung,50Gy/50f  . Hyperlipidemia    Crestor= myalgias  . Hypertension   . Lung cancer ([facility] 05/2013   non-small cell;  L upper lobectomy with mediastinal LN dissection, chemo, and radiation in 2014/2015. Remission until pt had questionable seizure 07/2015--MRI showed brain mets; palliative brain rad (stereotactic radiation therapy) started 08/25/15.  CT C/A/P clear 02/2016, 05/2016, 11/2016, 12/2018. MRI brain 12/2018 stable lesions/no new mets. Rpt 6 mo per onc.  . Malignant neoplasm of upper lobe, left bronchus or lung ([facility] 10/29/2013   *?[facility] 4.5[facility]  Dr. MJulien Nordmann(onc) recommended f/u CXR after abx course.  If mass unchanged then repeat CT chest.  Surveillance CT chest and MRI brain 06/2018 show no sign of dz.  . Myocardial infarction ([facility] 1992   Dr MAngelena Form   . Peripheral vascular disease ([facility] 08/2012   4.8x4.6 cm infrarenal abdominal aortic fusiform aneurysm, 1.5 cm right common iliac artery aneurysm.  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  .Marland KitchenPneumonia 09/06/2018   [facility]  [facility]   Stereotactic radiation therapy: frontoparietal 18gy,posterior frontal lobe 20gy,  . Seizure disorder ([facility] 2016/2017   as sequela of  brain mets;  Grand mal seizure 07/2015, then got on keppra and was seizure-free until focal motor seizures of L side of face began 02/2016- these responded well to up-titration of keppra but pt had adverse side effects so eventually pt had to be switched over to lamictal 07/2016--stable/seizure free on this med as of 09/2018 neuro f/u.  .Marland KitchenShortness of breath   . Tobacco dependence    "Quit" 1992, but pt has smoked "on and off" since that time    Past Surgical History:  Procedure Laterality Date  . ABDOMINAL AORTIC ENDOVASCULAR STENT GRAFT N/A 05/07/2014   Procedure: ABDOMINAL AORTIC ENDOVASCULAR STENT GRAFT;  Surgeon: VSerafina Mitchell MD;  [facility];  Service: Vascular;  Laterality: N/A;  . Carotid duplex dopplers  07/2015   1-39% on R, no signif dz noted on L  . COLONOSCOPY W/ POLYPECTOMY  2003    negative 2010,due 2020; Dr BOlevia Perches . CORONARY ANGIOPLASTY     no stents  . CYSTOSCOPY/RETROGRADE/URETEROSCOPY Bilateral 10/09/2012   Procedure: BILATERAL RETROGRADE bladder and urethral BIOPSY ;  Surgeon: DMolli Hazard MD;  [facility];  Service: Urology;  Laterality: Bilateral;  BILATERAL RETROGRADE   . EEG  08/05/15   Pt placed on keppra just prior to this  test due to having ? seizure (MRI showed brain mets)  . HERNIA REPAIR Bilateral    Inguinal  . INGUINAL HERIIORRHAPHY BILATERALLY    . IR ANGIOGRAM SELECTIVE EACH ADDITIONAL VESSEL  12/03/2017  . IR ANGIOGRAM VISCERAL SELECTIVE  12/03/2017  . IR ANGIOGRAM VISCERAL SELECTIVE  12/03/2017  . IR EMBO ARTERIAL NOT HEMORR HEMANG INC GUIDE ROADMAPPING  12/03/2017  . IR GENERIC HISTORICAL  07/20/2016   IR RADIOLOGIST EVAL & MGMT 07/20/2016 [facility] INTERV RAD  . IR GENERIC HISTORICAL  08/02/2016   IR RADIOLOGIST EVAL & MGMT 08/02/2016 Sandi Mariscal, MD [facility] INTERV RAD  . IR RADIOLOGIST EVAL & MGMT  11/08/2017  . IR RADIOLOGIST EVAL & MGMT  01/01/2018  . IR US GUIDE VASC ACCESS RIGHT  12/03/2017  . MEDIASTINOSCOPY N/A 05/01/2013    Procedure: MEDIASTINOSCOPY;  Surgeon: Gaye Pollack, MD;  Location[facility] OR;  Service: Thoracic;  Laterality: N/A;  . PFTs  04/2013   Minimal obstructive airway disease  . PILONIDAL CYST EXCISION    . PROSTATE BIOPSY N/A 10/09/2012   NEG bx 04/12/18 as well.  Procedure: PROSTATIC URETHRAL BIOPSY--BPH--no evidence of malignancy;  Surgeon: Molli Hazard, MD;  Location: [facility];  Service: Urology;  Laterality: N/A;  PROSTATIC URETHRAL BIOPSY  . PROSTATE BIOPSY  03/2018   NEG  . PTCA  1992  . ROTATOR CUFF REPAIR     Bilateral  . THOROCOTOMY WITH LOBECTOMY Left 05/29/2013   Procedure: LEFT THOROCOTOMY WITH LEFT UPPER LOBE LOBECTOMY;  Surgeon: Gaye Pollack, MD;  Location: [facility] OR;  Service: Thoracic;  Laterality: Left;  . TRANSTHORACIC ECHOCARDIOGRAM  07/2015   EF 50-55%, hypokin of inf myoc, grd I DD, mild MR, mod dilat of LA.  Marland Kitchen VIDEO BRONCHOSCOPY N/A 05/01/2013   Procedure: VIDEO BRONCHOSCOPY;  Surgeon: Gaye Pollack, MD;  Location: [facility] OR;  Service: Thoracic;  Laterality: N/A;    Family History  Problem Relation Age of Onset  . Hypertension Mother   . Prostate cancer Father        Prostate cancer  . Cancer Father        Brain Tumor  . Lung cancer Sister        NON SMOKER  . Cancer Sister   . Hyperlipidemia Sister   . Hyperlipidemia Brother   . Heart attack Brother   . Hypertension Brother   . Prostate cancer Brother   . Diabetes Neg Hx   . Stroke Neg Hx     Social History   Socioeconomic History  . Marital status: Married    Spouse name: Not on file  . Number of children: Not on file  . Years of education: Not on file  . Highest education level: Not on file  Occupational History  . Occupation: Retired Estate manager/land agent  . Smoking status: Current Every Day Smoker    Packs/day: 1.00    Years: 24.00    Pack years: 24.00    Types: Cigarettes    Last attempt to quit: 05/28/2013    Years since quitting: 6.1  . Smokeless tobacco: Never Used  . Tobacco  comment: QUIT 15 YEARS AGO-07/03/17 smoking  Substance and Sexual Activity  . Alcohol use: Yes    Alcohol/week: 15.0 standard drinks    Types: 3 Glasses of wine, 12 Cans of beer per week    Comment: 1-2 beers a day  . Drug use: No  . Sexual activity: Not on file  Other Topics Concern  .  Not on file  Social History Narrative   HEART HEALTHY DIET.     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   MARRIED, 2 children who live in [place].  Smoked on and off since then.   ALCOHOL USE -YES- RED WINE SOCIALLY, couple of beers at night usually.   07-08-18 Unable to ask abuse questions wife with him today.   Social Determinants of Health   Financial Resource Strain:   . Difficulty of Paying Living Expenses: Not on file  Food Insecurity:   . Worried About Charity fundraiser in the Last Year: Not on file  . Ran Out of Food in the Last Year: Not on file  Transportation Needs:   . Lack of Transportation (Medical): Not on file  . Lack of Transportation (Non-Medical): Not on file  Physical Activity:   . Days of Exercise per Week: Not on file  . Minutes of Exercise per Session: Not on file  Stress:   . Feeling of Stress : Not on file  Social Connections:   . Frequency of Communication with Friends and Family: Not on file  . Frequency of Social Gatherings with Friends and Family: Not on file  . Attends Religious Services: Not on file  . Active Member of Clubs or Organizations: Not on file  . Attends Archivist Meetings: Not on file  . Marital Status: Not on file  Intimate Partner Violence:   . Fear of Current or Ex-Partner: Not on file  . Emotionally Abused: Not on file  . Physically Abused: Not on file  . Sexually Abused: Not on file    Outpatient Medications Prior to Visit  Medication Sig Dispense Refill  . amLODipine (NORVASC) 5 MG tablet Take 1 tablet (5 mg total) by mouth daily. OFFICE VISIT NEEDED FOR FURTHER REFILLS. 90 tablet 1  . aspirin EC 81 MG tablet Take  81 mg by mouth at bedtime.     Marland Kitchen atorvastatin (LIPITOR) 20 MG tablet Take 1 tablet (20 mg total) by mouth daily. 90 tablet 3  . budesonide-formoterol (SYMBICORT) 160-4.5 MCG/ACT inhaler INHALE 1-2 PUFFS INTO THE LINGS EVERY 12 HOURS. GARGLE AND SPIT AFTER USE. (Patient taking differently: Inhale 2 puffs into the lungs 2 (two) times daily. INHALE 2 PUFFS INTO THE LINGS EVERY 12 HOURS. GARGLE AND SPIT AFTER USE.) 30.6 Inhaler 1  . lamoTRIgine (LAMICTAL) 200 MG tablet TAKE 1 TABLET BY MOUTH IN THE MORNING AND 1&1/2 TABLETS IN THE EVENING (Patient taking differently: Take 200 mg by mouth 2 (two) times daily. ) 216 tablet 1  . magnesium oxide (MAG-OX) 400 MG tablet Take 400 mg by mouth daily.    . metoprolol tartrate (LOPRESSOR) 50 MG tablet Take 1 tablet (50 mg total) by mouth 2 (two) times daily. 180 tablet 0  . Multiple Vitamin (MULTIVITAMIN WITH MINERALS) TABS tablet Take 1 tablet by mouth daily.    . tamsulosin (FLOMAX) 0.4 MG CAPS capsule Take 0.4 mg by mouth daily.    Marland Kitchen albuterol (PROVENTIL HFA;VENTOLIN HFA) 108 (90 Base) MCG/ACT inhaler Inhale 2 puffs into the lungs every 6 (six) hours as needed for wheezing or shortness of breath. (Patient not taking: Reported on 08/07/2019) 18 g 1  . diphenhydrAMINE (BENADRYL) 50 MG tablet Take 1 tablet (50 mg total) by mouth as directed. Take 1 tablet (50 mg) by mouth 1 hr prior to CTA (scheduled for 11/08/2017) (Patient not taking: Reported on 08/07/2019) 1 tablet 3  . metoprolol tartrate (LOPRESSOR) 50  MG tablet TAKE 1 TABLET BY MOUTH TWICE A DAY 180 tablet 0  . predniSONE (DELTASONE) 50 MG tablet TAKE 1 TABLET BY MOUTH 13 HOURS PRIOR TO SCAN, 7 HOURS PRIOR TO SCAN AND 1 HOUR PRIOR TO SCAN 3 tablet 3   No facility-administered medications prior to visit.    Allergies  Allergen Reactions  . Quinolones Other (See Comments)    Pt with aneurism: need to avoid quinoline use b/c of weakening of vascular wall that can be associated with quinolones.  . Wellbutrin  [Bupropion] Other (See Comments)    AVOID IN PT W/HX OF SEIZURES  . Rosuvastatin Other (See Comments)    Myalgias and dark urine  . Iodinated Diagnostic Agents Hives    1 hive on lt cheek lasting approximately 1 hour on last 2 CT per pt; needs pre meds in future; 50 mg benadryl po 1 hr prior to exam per Dr. Weber Cooks 07/2014  11/2016 per Tery Sanfilippo, pt needs 13hr premeds per our policy.    ROS Review of Systems  Constitutional: Negative for appetite change, chills, fatigue and fever.  HENT: Negative for congestion, dental problem, ear pain and sore throat.   Eyes: Negative for discharge, redness and visual disturbance.  Respiratory: Negative for cough, chest tightness, shortness of breath and wheezing.   Cardiovascular: Negative for chest pain, palpitations and leg swelling.  Gastrointestinal: Negative for abdominal pain, blood in stool, diarrhea, nausea and vomiting.  Endocrine: Negative for polydipsia, polyphagia and polyuria.  Genitourinary: Negative for difficulty urinating, dysuria, flank pain, frequency, hematuria and urgency.  Musculoskeletal: Negative for arthralgias, back pain, joint swelling, myalgias and neck stiffness.  Skin: Negative for pallor and rash.  Allergic/Immunologic: Negative for immunocompromised state.  Neurological: Negative for dizziness, tremors, seizures, speech difficulty, weakness, light-headedness and headaches.  Hematological: Negative for adenopathy. Does not bruise/bleed easily.  Psychiatric/Behavioral: Negative for confusion, dysphoric mood and sleep disturbance. The patient is not nervous/anxious.     PE; Blood pressure (!) 165/72, pulse 71, temperature 98.6 F (37 C), temperature source Temporal, resp. rate 18, weight 140 lb 2 oz (63.6 kg), SpO2 98 %. Body mass index is 24.82 kg/m.  Gen: Alert, well appearing.  Patient is oriented to person, place, time, and situation. AFFECT: pleasant, lucid thought and speech. ENT: Ears: EACs clear, normal  epithelium.  TMs with good light reflex and landmarks bilaterally.  Eyes: no injection, icteris, swelling, or exudate.  EOMI, PERRLA. Nose: no drainage or turbinate edema/swelling.  No injection or focal lesion.  Mouth: lips without lesion/swelling.  Oral mucosa pink and moist.  Dentition intact and without obvious caries or gingival swelling.  Oropharynx without erythema, exudate, or swelling.  Neck: supple/nontender.  No LAD, mass, or TM.  Carotid pulses 2+ bilaterally, without bruits. CV: RRR, no m/r/g.   LUNGS: CTA bilat, nonlabored resps, good aeration in all lung fields. ABD: soft, NT, ND, BS normal.  No hepatospenomegaly or mass.  No bruits. EXT: no clubbing, cyanosis, or edema.  Musculoskeletal: no joint swelling, erythema, warmth, or tenderness.  ROM of all joints intact. Skin - no sores or suspicious lesions or rashes or color changes Neuro: CN 2-12 intact bilaterally, strength 5/5 in proximal and distal upper extremities and lower extremities bilaterally.  No tremor.  FNF normal bilsat.  No ataxia.  Upper extremity and lower extremity DTRs symmetric.  No pronator drift.   Pertinent labs:  Lab Results  Component Value Date   TSH 0.708 08/05/2015   Lab Results  Component Value Date  WBC 8.2 07/10/2019   HGB 15.9 07/10/2019   HCT 47.4 07/10/2019   MCV 87.9 07/10/2019   PLT 253 07/10/2019   Lab Results  Component Value Date   CREATININE 1.04 07/10/2019   BUN 19 07/10/2019   NA 138 07/10/2019   K 4.6 07/10/2019   CL 104 07/10/2019   CO2 21 (L) 07/10/2019   Lab Results  Component Value Date   ALT 14 07/10/2019   AST 12 (L) 07/10/2019   ALKPHOS 78 07/10/2019   BILITOT 0.3 07/10/2019   Lab Results  Component Value Date   CHOL 133 05/09/2019   Lab Results  Component Value Date   HDL 44.10 05/09/2019   Lab Results  Component Value Date   LDLCALC 70 05/09/2019   Lab Results  Component Value Date   TRIG 92.0 05/09/2019   Lab Results  Component Value Date    CHOLHDL 3 05/09/2019   Lab Results  Component Value Date   PSA 24.10 03/07/2018   PSA 26.50 01/23/2018   PSA 31.32 (H) 12/14/2017   Lab Results  Component Value Date   HGBA1C 5.7 07/04/2013   08/04/19 covid 19 NEG  12 lead EKG today: NSR, rate 70, 1st deg AV block, occ ectop ventric beat, poor R wave progression, large S waves in inferior leads, no ST segment changes, no Q waves, QT interval and QRS duration normal. Compared to EKG 08/04/15, 1st deg AV block is new.  ASSESSMENT AND PLAN:   1) Chronic hoarseness, vocal cord dysfunction->pt set for surgery approx Jan 2021.  2) Preoperative clearance: from a cardiac, pulmonary, renal, hepatic, oncologic, and neurologic standpoint I feel he needs no further testing prior to surgery. However, I have recommended that he contact his IR MD to do appropriate f/u visit and likely f/u imaging of the coil procedure done for his AAA endograft leak back in April 2019.  We spent a good deal of time today going over the details of this and the reason behind the need for it.  He asked questions and I answered them the best I could.  He expressed understanding and stated he would call for f/u with Dr. Sandi Mariscal in Interventional Radiology. Instructions given to pt today: Contact Dr. Sandi Mariscal, your interventional radiologist, to make a follow up appt and you will need to get re-imaging of your leaky abdominal aneurism graft that was coiled. Phone: [phone]. You must do this prior to getting cleared for surgery.  Spent 45 min with pt today, with >50% of this time spent in counseling and care coordination regarding the above problems.  An After Visit Summary was printed and given to the patient.  FOLLOW UP:  Return for to be determined based on results of IR eval.  Signed:  Crissie Sickles, MD           08/07/2019

## 2019-08-09 ENCOUNTER — Other Ambulatory Visit: Payer: Self-pay | Admitting: Family Medicine

## 2019-08-19 ENCOUNTER — Other Ambulatory Visit: Payer: Self-pay | Admitting: Interventional Radiology

## 2019-08-19 ENCOUNTER — Telehealth: Payer: Self-pay | Admitting: Nurse Practitioner

## 2019-08-19 DIAGNOSIS — IMO0001 Reserved for inherently not codable concepts without codable children: Secondary | ICD-10-CM

## 2019-08-19 DIAGNOSIS — T82330D Leakage of aortic (bifurcation) graft (replacement), subsequent encounter: Secondary | ICD-10-CM

## 2019-08-19 NOTE — Telephone Encounter (Signed)
Phone call to patient to review instructions for 13 hr prep for CT w/ contrast on 08/25/18 at 1530. Prescription called into [facility] in [place]. Left message on pt's machine to inform pt the rx was called in and to call back with any questions. Prescription: 0230- 50mg  Prednisone 0830- 50mg  Prednisone 1430- 50mg  Prednisone and 50mg  Benadryl

## 2019-08-20 ENCOUNTER — Other Ambulatory Visit: Payer: Self-pay | Admitting: *Deleted

## 2019-08-20 DIAGNOSIS — T82330A Leakage of aortic (bifurcation) graft (replacement), initial encounter: Secondary | ICD-10-CM

## 2019-08-20 DIAGNOSIS — IMO0002 Reserved for concepts with insufficient information to code with codable children: Secondary | ICD-10-CM

## 2019-08-25 ENCOUNTER — Other Ambulatory Visit: Payer: Self-pay | Admitting: Family Medicine

## 2019-08-26 ENCOUNTER — Other Ambulatory Visit: Payer: Self-pay

## 2019-08-26 ENCOUNTER — Ambulatory Visit (HOSPITAL_COMMUNITY)
Admission: RE | Admit: 2019-08-26 | Discharge: 2019-08-26 | Disposition: A | Discharge status: Home / Self Care | Payer: Medicare Other | Source: Ambulatory Visit | Attending: Interventional Radiology | Admitting: Interventional Radiology

## 2019-08-26 DIAGNOSIS — I714 Abdominal aortic aneurysm, without rupture: Secondary | ICD-10-CM | POA: Diagnosis not present

## 2019-08-26 DIAGNOSIS — T82330D Leakage of aortic (bifurcation) graft (replacement), subsequent encounter: Secondary | ICD-10-CM | POA: Diagnosis not present

## 2019-08-26 DIAGNOSIS — IMO0001 Reserved for inherently not codable concepts without codable children: Secondary | ICD-10-CM

## 2019-08-26 LAB — POCT I-STAT CREATININE: Creatinine, Ser: 0.9 mg/dL (ref 0.61–1.24)

## 2019-08-26 MED ORDER — IOHEXOL 350 MG/ML SOLN
100.0000 mL | Freq: Once | INTRAVENOUS | Status: AC | PRN
  Administered 2019-08-26: 100 mL via INTRAVENOUS

## 2019-08-27 ENCOUNTER — Other Ambulatory Visit: Payer: Self-pay | Admitting: Surgery

## 2019-08-27 ENCOUNTER — Ambulatory Visit
Admission: RE | Admit: 2019-08-27 | Discharge: 2019-08-27 | Disposition: A | Discharge status: Home / Self Care | Payer: Medicare Other | Source: Ambulatory Visit | Attending: Interventional Radiology | Admitting: Interventional Radiology

## 2019-08-27 ENCOUNTER — Encounter: Payer: Self-pay | Admitting: Family Medicine

## 2019-08-27 ENCOUNTER — Encounter: Payer: Self-pay | Admitting: *Deleted

## 2019-08-27 DIAGNOSIS — IMO0001 Reserved for inherently not codable concepts without codable children: Secondary | ICD-10-CM

## 2019-08-27 DIAGNOSIS — T82330D Leakage of aortic (bifurcation) graft (replacement), subsequent encounter: Secondary | ICD-10-CM

## 2019-08-27 DIAGNOSIS — T82330A Leakage of aortic (bifurcation) graft (replacement), initial encounter: Secondary | ICD-10-CM | POA: Diagnosis not present

## 2019-08-27 HISTORY — PX: IR RADIOLOGIST EVAL & MGMT: IMG5224

## 2019-08-27 NOTE — Progress Notes (Signed)
Patient ID: Jordan Mccullough, male   DOB: 01-13-1941, 79 y.o.   MRN: [id number]         Chief Complaint: Post type II endoleak repair  Referring Physician(s): Brabham (Vascular Surgery) Redmond Baseman (ENT) Mohammed (Oncology)  History of Present Illness:  Jordan Mccullough is a 79 y.o. male with complex past medical history significant for smoking, hypertension, hyperlipidemia, CAD and lung cancer (with brain metastasis though remains clinically stable without evidence of disease progression) who initially underwent a technically successful repair of infrarenal abdominal aortic aneurysm by Dr. Trula Slade on 06/08/2014.  The patient was initially seen in consultation on 08/02/2016 for evaluation of potential her treatment options for a endoleak, however at that time it was decided to pursue conservative management given lack of significant growth of the native sac of the abdominal aortic aneurysm.  The patient's native abdominal aortic aneurysm sac was found to enlarge on subsequent surveillance examinations and ultimately the patient underwent percutaneous coil and liquid embolic embolization of type II endoleak on 12/03/2017.  Despite multiple efforts by the interventional radiology clinic, the patient was lost to follow-up.  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 Redmond Baseman for potential intervention for surgical correction.  As part of Dr. Redmond Baseman evaluation he has insisted the patient return to the interventional radiology clinic for postprocedural evaluation and management.  Patient is seen in consultation, accompanied by his wife, via telemedicine following acquisition of a dedicated post stent repair CTA performed yesterday 08/26/2019.  Patient reports difficulty speaking and problems with his voice though is otherwise without complaint. Specifically, no abdominal pain.  The  patient continues to smoke.  Fortunately cancer surveillance imaging performed by Dr. Julien Nordmann has been negative for evidence of disease recurrence.     Past Medical History:  Diagnosis Date  . AAA (abdominal aortic aneurysm) (HCC)    s/p repair (aortoiliac bipass; with persistent endograft leak in inferior mesenteric artery)--stable as of 01/2017 vascular f/u.  Marland Kitchen Asymptomatic cholelithiasis 07/2015   Incidental finding on PET CT  . Back pain 04/19/2016  . BPH with elevated PSA    PSA signif rise 11/2017 (5.4 in 2015 to 31.21 November 2017. Bx 03/2018 benign.  . Coronary artery disease    MI 1992, S/P  PTCA; negative stress test in November 2011 with no ischemia.   . Diverticulosis   . Elevated PSA 01/2018   01/22/18:  PSA 26.5.  Prostate MRI with abnormality->subsequent prostate bx BENIGN 04/12/18.  Rpt PSA 03/07/18 stable at 24.1-->f/u 1 yr with urologist.  . [name] hematuria 05/2018   Occurred 2+ mo's after prostate bx: attributed to bx by urol, treated empirically with keflex.  Urol initially suspected gross hem was sequela of recent prost bx. BUT recurrence of gross hem prompted full hematuria w/u 11/15/18.  Marland Kitchen Hearing loss    Bilateral   . History of radiation therapy 11/10/13-12/12/13   lung,50Gy/28fx  . Hyperlipidemia    Crestor= myalgias  . Hypertension   . Lung cancer ([place]) 05/2013   non-small cell;  L upper lobectomy with mediastinal LN dissection, chemo, and radiation in 2014/2015. Remission until pt had questionable seizure 07/2015--MRI showed brain mets; palliative brain rad (stereotactic radiation therapy) started 08/25/15.  CT C/A/P clear 02/2016, 05/2016, 11/2016, 12/2018. MRI brain 12/2018 stable lesions/no new mets. Rpt 6 mo per onc.  . Malignant neoplasm of upper lobe, left bronchus or lung ([place])  10/29/2013   *?New spiculated 4.5cm mass in L upper lung field on CXR at [facility] in [place] 07/14/17.  Dr. Julien Nordmann (onc) recommended f/u CXR after abx course.  If mass unchanged  then repeat CT chest.  Surveillance CT chest and MRI brain 06/2018 show no sign of dz.  . Myocardial infarction [facility]) 1992   Dr Angelena Form    . Peripheral vascular disease ([place]) 08/2012   4.8x4.6 cm infrarenal abdominal aortic fusiform aneurysm, 1.5 cm right common iliac artery aneurysm.  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  Marland Kitchen Pneumonia 09/06/2018   XRAY was done at [facility].  . S/P radiation therapy [facility] 08/25/15   Stereotactic radiation therapy: frontoparietal 18gy,posterior frontal lobe 20gy,  . Seizure disorder ([place]) 2016/2017   as sequela of brain mets;  Grand mal seizure 07/2015, then got on keppra and was seizure-free until focal motor seizures of L side of face began 02/2016- these responded well to up-titration of keppra but pt had adverse side effects so eventually pt had to be switched over to lamictal 07/2016--stable/seizure free on this med as of 09/2018 neuro f/u.  Marland Kitchen Shortness of breath   . Tobacco dependence    "Quit" 1992, but pt has smoked "on and off" since that time    Past Surgical History:  Procedure Laterality Date  . ABDOMINAL AORTIC ENDOVASCULAR STENT GRAFT N/A 05/07/2014   Procedure: ABDOMINAL AORTIC ENDOVASCULAR STENT GRAFT;  Surgeon: Serafina Mitchell, MD;  Location: [facility];  Service: Vascular;  Laterality: N/A;  . Carotid duplex dopplers  07/2015   1-39% on R, no signif dz noted on L  . COLONOSCOPY W/ POLYPECTOMY  2003    negative 2010,due 2020; Dr Olevia Perches  . CORONARY ANGIOPLASTY     no stents  . CYSTOSCOPY/RETROGRADE/URETEROSCOPY Bilateral 10/09/2012   Procedure: BILATERAL RETROGRADE bladder and urethral BIOPSY ;  Surgeon: Molli Hazard, MD;  Location: [facility];  Service: Urology;  Laterality: Bilateral;  BILATERAL RETROGRADE   . EEG  08/05/15   Pt placed on keppra just prior to this test due to having ? seizure (MRI showed brain mets)  . HERNIA  REPAIR Bilateral    Inguinal  . INGUINAL HERIIORRHAPHY BILATERALLY    . IR ANGIOGRAM SELECTIVE EACH ADDITIONAL VESSEL  12/03/2017  . IR ANGIOGRAM VISCERAL SELECTIVE  12/03/2017  . IR ANGIOGRAM VISCERAL SELECTIVE  12/03/2017  . IR EMBO ARTERIAL NOT HEMORR HEMANG INC GUIDE ROADMAPPING  12/03/2017  . IR GENERIC HISTORICAL  07/20/2016   IR RADIOLOGIST EVAL & MGMT 07/20/2016 [facility] INTERV RAD  . IR GENERIC HISTORICAL  08/02/2016   IR RADIOLOGIST EVAL & MGMT 08/02/2016 Sandi Mariscal, MD [facility] INTERV RAD  . IR RADIOLOGIST EVAL & MGMT  11/08/2017  . IR RADIOLOGIST EVAL & MGMT  01/01/2018  . IR US GUIDE VASC ACCESS RIGHT  12/03/2017  . MEDIASTINOSCOPY N/A 05/01/2013   Procedure: MEDIASTINOSCOPY;  Surgeon: Gaye Pollack, MD;  Location: [facility] OR;  Service: Thoracic;  Laterality: N/A;  . PFTs  04/2013   Minimal obstructive airway disease  . PILONIDAL CYST EXCISION    . PROSTATE BIOPSY N/A 10/09/2012   NEG bx 04/12/18 as well.  Procedure: PROSTATIC URETHRAL BIOPSY--BPH--no evidence of malignancy;  Surgeon: Molli Hazard, MD;  Location: [facility];  Service: Urology;  Laterality: N/A;  PROSTATIC URETHRAL BIOPSY  . PROSTATE BIOPSY  03/2018   NEG  .  PTCA  1992  . ROTATOR CUFF REPAIR     Bilateral  . THOROCOTOMY WITH LOBECTOMY Left 05/29/2013   Procedure: LEFT THOROCOTOMY WITH LEFT UPPER LOBE LOBECTOMY;  Surgeon: Gaye Pollack, MD;  Location: [facility];  Service: Thoracic;  Laterality: Left;  . TRANSTHORACIC ECHOCARDIOGRAM  07/2015   EF 50-55%, hypokin of inf myoc, grd I DD, mild MR, mod dilat of LA.  Marland Kitchen VIDEO BRONCHOSCOPY N/A 05/01/2013   Procedure: VIDEO BRONCHOSCOPY;  Surgeon: Gaye Pollack, MD;  Location: [facility];  Service: Thoracic;  Laterality: N/A;    Allergies: Quinolones, Wellbutrin [bupropion], Rosuvastatin, and Iodinated diagnostic agents  Medications: Prior to Admission medications   Medication Sig Start Date End Date Taking? Authorizing Provider  albuterol (PROVENTIL HFA;VENTOLIN HFA) 108 (90 Base)  MCG/ACT inhaler Inhale 2 puffs into the lungs every 6 (six) hours as needed for wheezing or shortness of breath. Patient not taking: Reported on 08/07/2019 10/18/18   Tammi Sou, MD  amLODipine (NORVASC) 5 MG tablet Take 1 tablet (5 mg total) by mouth daily. 08/26/19   McGowen, Adrian Blackwater, MD  aspirin EC 81 MG tablet Take 81 mg by mouth at bedtime.     [provider]  atorvastatin (LIPITOR) 20 MG tablet Take 1 tablet (20 mg total) by mouth daily. 05/09/19   McGowen, Adrian Blackwater, MD  budesonide-formoterol (SYMBICORT) 160-4.5 MCG/ACT inhaler INHALE 1-2 PUFFS INTO THE LUNGS EVERY 12 HOURS. GARGLE AND SPIT AFTER USE. 08/11/19   McGowen, Adrian Blackwater, MD  diphenhydrAMINE (BENADRYL) 50 MG tablet Take 1 tablet (50 mg total) by mouth as directed. Take 1 tablet (50 mg) by mouth 1 hr prior to CTA (scheduled for 11/08/2017) Patient not taking: Reported on 08/07/2019 12/23/18   [name], MD  lamoTRIgine (LAMICTAL) 200 MG tablet TAKE 1 TABLET BY MOUTH IN THE MORNING AND 1&1/2 TABLETS IN THE EVENING Patient taking differently: Take 200 mg by mouth 2 (two) times daily.  05/08/19   Cameron Sprang, MD  magnesium oxide (MAG-OX) 400 MG tablet Take 400 mg by mouth daily.    [provider]  metoprolol tartrate (LOPRESSOR) 50 MG tablet Take 1 tablet (50 mg total) by mouth 2 (two) times daily. 08/01/19   McGowen, Adrian Blackwater, MD  Multiple Vitamin (MULTIVITAMIN WITH MINERALS) TABS tablet Take 1 tablet by mouth daily.    [provider]  tamsulosin (FLOMAX) 0.4 MG CAPS capsule Take 0.4 mg by mouth daily. 03/03/19   [provider]     Family History  Problem Relation Age of Onset  . Hypertension Mother   . Prostate cancer Father        Prostate cancer  . Cancer Father        Brain Tumor  . Lung cancer Sister        NON SMOKER  . Cancer Sister   . Hyperlipidemia Sister   . Hyperlipidemia Brother   . Heart attack Brother   . Hypertension Brother   . Prostate cancer Brother   .  Diabetes Neg Hx   . Stroke Neg Hx     Social History   Socioeconomic History  . Marital status: Married    Spouse name: Not on file  . Number of children: Not on file  . Years of education: Not on file  . Highest education level: Not on file  Occupational History  . Occupation: Retired Estate manager/land agent  . Smoking status: Current Every Day Smoker    Packs/day: 1.00  Years: 24.00    Pack years: 24.00    Types: Cigarettes    Last attempt to quit: 05/28/2013    Years since quitting: 6.2  . Smokeless tobacco: Never Used  . Tobacco comment: QUIT 15 YEARS AGO-07/03/17 smoking  Substance and Sexual Activity  . Alcohol use: Yes    Alcohol/week: 15.0 standard drinks    Types: 3 Glasses of wine, 12 Cans of beer per week    Comment: 1-2 beers a day  . Drug use: No  . Sexual activity: Not on file  Other Topics Concern  . Not on file  Social History Narrative   HEART HEALTHY DIET.     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   MARRIED, 2 children who live in [place].  Smoked on and off since then.   ALCOHOL USE -YES- RED WINE SOCIALLY, couple of beers at night usually.   07-08-18 Unable to ask abuse questions wife with him today.   Social Determinants of Health   Financial Resource Strain:   . Difficulty of Paying Living Expenses: Not on file  Food Insecurity:   . Worried About Charity fundraiser in the Last Year: Not on file  . Ran Out of Food in the Last Year: Not on file  Transportation Needs:   . Lack of Transportation (Medical): Not on file  . Lack of Transportation (Non-Medical): Not on file  Physical Activity:   . Days of Exercise per Week: Not on file  . Minutes of Exercise per Session: Not on file  Stress:   . Feeling of Stress : Not on file  Social Connections:   . Frequency of Communication with Friends and Family: Not on file  . Frequency of Social Gatherings with Friends and Family: Not on file  . Attends Religious Services: Not on file   . Active Member of Clubs or Organizations: Not on file  . Attends Archivist Meetings: Not on file  . Marital Status: Not on file    ECOG Status: 1 - Symptomatic but completely ambulatory  Review of Systems  Review of Systems: A 12 point ROS discussed and pertinent positives are indicated in the HPI above.  All other systems are negative.  Physical Exam No direct physical exam was performed (except for noted visual exam findings with Video Visits).    Vital Signs: There were no vitals taken for this visit.  Imaging: CT Angio Abd/Pel w/ and/or w/o  Result Date: 08/27/2019 CLINICAL DATA:  History of endovascular repair of infrarenal abdominal aortic aneurysm complicated by development of a endoleak ultimately requiring catheter directed endoleak repair performed 12/03/2017. History of lung cancer. EXAM: CTA ABDOMEN AND PELVIS WITHOUT AND WITH CONTRAST TECHNIQUE: Multidetector CT imaging of the abdomen and pelvis was performed using the standard protocol during bolus administration of intravenous contrast. Multiplanar reconstructed images and MIPs were obtained and reviewed to evaluate the vascular anatomy. CONTRAST:  164mL OMNIPAQUE IOHEXOL 350 MG/ML SOLN COMPARISON:  11/08/2017; 12/13/2016 FINDINGS: VASCULAR Aorta: Stable sequela of endovascular repair of infrarenal abdominal aortic aneurysm. The stent graft remains widely patent without evidence of in stent stenosis. The proximal end of the stent graft is well apposed against the walls of the infrarenal abdominal aorta while the distal limbs are well apposed against the walls of the bilateral common iliac arteries. Stable sequela of previous coil and Onyx embolization of complex type 2 endoleak previously noted to involve the accessory right renal artery combined origin of the bilateral L4  lumbar arteries, the IMA and the middle sacral arteries. Evaluation for a residual or recurrent type though endoleak is degraded secondary to  associated streak artifact with the embolic material, however there appears to be persistent opacification involving the posterior superior aspect of the otherwise excluded native abdominal aortic aneurysm sac supplied by common origin of the L2-L3 lumbar arteries (representative image 91, series 9) with potential additional supply via a tiny accessory right renal artery. Though this finding is without associated enlargement of the caliber of the native abdominal aorta, currently measuring 5.6 x 5.3 x 5.8 cm as measured in greatest oblique short axis axial (image 58, series 5), coronal (image 72, series 8) and sagittal (image 82, series 9) dimensions respectively, previously, 5.5 x 5.4 x 5.7 cm. No periaortic stranding. Celiac: Widely patent without a hemodynamically significant narrowing. Conventional branching pattern. SMA: Widely patent without a hemodynamically significant narrowing. Conventional branching pattern. Renals: Redemonstrated duplicated right renal artery with a tiny accessory right renal artery supplying and expectedly atrophic inferior pole the right kidney. Unfortunately, there appears to be continued atretic flow within this tiny accessory right renal artery which could help contribute to the persistent small type 2 endoleak. The dominant bilateral renal arteries are widely patent without hemodynamically significant narrowing. No vessel irregularity to suggest FMD. IMA: Embolized at its origin with early reconstitution via collateral supply from the SMA. Inflow: As above, the distal limbs of the endovascular stent graft are well apposed against the walls of the mildly ectatic bilateral common iliac arteries, unchanged. The bilateral internal iliac arteries are tortuous and mildly ectatic with the left measuring approximately 1.3 cm in diameter (image 97, series 5 and the right measuring 1.2 cm (image 91, series 5). The bilateral external iliac arteries are tortuous though widely patent without  hemodynamically significant narrowing. Proximal Outflow: Minimal amount of mixed calcified and noncalcified atherosclerotic plaque involving the bilateral common femoral arteries, not resulting in hemodynamically significant stenosis. The imaged portions of the bilateral deep and superficial femoral arteries are widely patent without hemodynamically significant narrowing. Veins: The IVC and pelvic venous systems appear widely patent. Review of the MIP images confirms the above findings. _________________________________________________________ NON-VASCULAR Lower chest: Limited visualization lower thorax demonstrates minimal dependent right basilar subsegmental atelectasis. Normal heart size. No pericardial effusion. Hepatobiliary: Normal hepatic contour. There is an ill-defined punctate (approximately 0.6 cm) area of hyperenhancement within the dome of the right lobe of the liver (image 24, series 5), which is not seen on the portal venous phase images it is too small to adequately characterize though may represent a perfusional anomaly. This finding is associated with mildly altered perfusion involving the subcapsular aspect of the lateral segment of the left lobe of the liver. Otherwise, no discrete worrisome hepatic lesions. There are several radiopaque gallstones including a peripherally calcified/laminated approximately 1.2 cm gallstone within the neck of a decompressed but otherwise normal-appearing gallbladder (coronal image 65, series 8). No gallbladder wall thickening or pericholecystic stranding. No ascites. Pancreas: Normal appearance of the pancreas. Spleen: Normal appearance of the spleen. Adrenals/Urinary Tract: There is symmetric enhancement is creation of the bilateral kidneys. Redemonstrated left-sided renal cysts with dominant left-sided renal cyst measuring 3.8 cm in diameter (image 24, series 12). Additional bilateral subcentimeter hypoattenuating renal lesions are too small to adequately  characterize though favored to represent additional renal cysts. There are 2 punctate (2 mm) nonobstructing left-sided renal stones (images 3 and 5, series 4). Redemonstrated geographic atrophy involving the inferior pole of the right kidney, supplied by  an accessory right renal artery. There is symmetric likely age and body habitus related bilateral perinephric stranding. No urine obstruction. Normal appearance of the bilateral adrenal glands. Mass effect of prostate gland upon the undersurface of the urinary bladder with associated diffuse thickening the urinary bladder wall. Stomach/Bowel: Large colonic stool burden without evidence of enteric obstruction. Scattered colonic diverticulosis without evidence superimposed acute diverticulitis. Normal appearance of the terminal ileum and the retrocecal appendix. No hiatal hernia. No discrete areas of bowel wall thickening. No pneumoperitoneum, pneumatosis or portal venous gas. Lymphatic: No bulky retroperitoneal, mesenteric, pelvic or inguinal lymph adenopathy. Reproductive: Proximally with mass effect on the undersurface of the urinary bladder. Scattered dystrophic calcifications are seen within the prostate gland. No free fluid within the pelvic cul-de-sac. Other: Unchanged moderate-sized mesenteric fat containing left-sided direct inguinal hernia measuring approximately 3.0 x 2.8 x 2.8 cm (axial image 120, series 5; coronal image 58, series 8). Musculoskeletal: No acute or aggressive osseous abnormalities. Moderate (approximately 40%) compression deformity involving the superior endplate of the L4 vertebral body (sagittal image 89, series 9). IMPRESSION: VASCULAR 1. Post endovascular repair infrarenal abdominal aortic aneurysm and liquid and coil embolization of complex type 2 endoleak with findings concerning for a persistent tiny type 2 endoleak supplied by combined origins of the bilateral L2-L3 lumbar arteries and a tiny accessory right renal artery. This  finding is without significant change in size of the native abdominal aorta measuring 5.8 cm in diameter, previously, 5.7 cm when compared to the 11/2017 examination. Aortic aneurysm NOS (ICD10-I71.9). 2.  Aortic Atherosclerosis (ICD10-I70.0). NON-VASCULAR 1. Cholelithiasis without evidence of cholecystitis. 2. Prostatomegaly with mass effect on the undersurface of the urinary bladder with associated circumferential thickening the urinary bladder wall as could be seen in the setting of chronic bladder outlet obstruction. If not recently performed, further evaluation DRE is advised. 3. Unchanged approximately 3 cm left-sided direct mesenteric fat containing inguinal hernia. Electronically Signed   By: Sandi Mariscal M.D.   On: 08/27/2019 08:56    Labs:  CBC: Recent Labs    01/03/19 0930 07/10/19 1037  WBC 11.0* 8.2  HGB 15.3 15.9  HCT 47.0 47.4  PLT 267 253    COAGS: No results for input(s): INR, APTT in the last 8760 hours.  BMP: Recent Labs    01/03/19 0930 05/09/19 1132 07/10/19 1037 08/26/19 1622  NA 141 139 138  --   K 4.2 4.1 4.6  --   CL 106 104 104  --   CO2 25 25 21*  --   GLUCOSE 138* 90 147*  --   BUN 13 16 19   --   CALCIUM 9.7 9.8 9.9  --   CREATININE 1.05 0.96 1.04 0.90  GFRNONAA >60  --  >60  --   GFRAA >60  --  >60  --     LIVER FUNCTION TESTS: Recent Labs    01/03/19 0930 05/09/19 1132 07/10/19 1037  BILITOT 0.3 0.4 0.3  AST 14* 14 12*  ALT 17 13 14   ALKPHOS 74 67 78  PROT 7.5 6.8 7.8  ALBUMIN 3.9 4.3 4.2    TUMOR MARKERS: No results for input(s): AFPTM, CEA, CA199, CHROMGRNA in the last 8760 hours.  Assessment and Plan:  Jordan Mccullough is a 79 y.o. male with complex past medical history significant for smoking, hypertension, hyperlipidemia, CAD and lung cancer (with brain metastasis though remains clinically stable without evidence of disease progression) who initially underwent a technically successful repair of infrarenal abdominal aortic  aneurysm by Dr. Trula Slade on 06/08/2014 and ultimately percutaneous coil and liquid embolic embolization of type II endoleak on 12/03/2017.  Despite multiple efforts by the interventional radiology clinic, the patient was lost to follow-up following the endoleak repair.  Post stent CTA performed 08/26/2019 demonstrates findings worrisome for a tiny residual endoleak, likely supplied via combined origins of the L2-L3 lumbar artery as well as a tiny accessory right renal artery, however the amounts of intraluminal enhancement has significantly decreased compared to the preprocedural CTA performed 11/08/2017 and is without significant enlargement of the native abdominal aortic aneurysm sac, currently measuring 5.8 x 5.6 x 5.3 cm, previously, 5.7 x 5.5 x 5.4 cm, when compared to the 10/2017 examination.  Given relative stability of the native abdominal aortic sac for nearly 2 years, I do NOT feel dedicated intervention is not required at this time.  Regardless, I do not feel the patient will agree to undergo additional dedicated intervention at this time as potential future percutaneous treatment would likely require a direct aortic stick though I am uncertain this effort would be successful given lack of significant enhancement of the otherwise excluded native abdominal aorta.  Given the persistence of the endoleak, I do feel it is reasonable to obtain a follow-up dedicated post stent repair CTA in 1 year (January 2022), though admittedly I am uncertain the patient will be compliant with this follow-up recommendation.  The patient and the patient's wife demonstrated excellent understanding of the above conversation and know to call the interventional radiology clinic with any interval questions or concerns.  Plan: - Stability of the aneurysm sac suggest patient could undergo intervention at deemed appropriate by Dr. Redmond Baseman. - Follow-up CTA of the abdomen and pelvis in 1 year (January 2022).  A copy of this  report was sent to the requesting provider on this date.  Electronically Signed: Sandi Mariscal 08/27/2019, 9:33 AM   I spent a total of 15 Minutes in remote  clinical consultation, greater than 50% of which was counseling/coordinating care for post type II endoleak.    Visit type: Audio only (telephone). Audio (no video) only due to patient's lack of internet/smartphone capability. Alternative for in-person consultation at [facility], [address], [place], [place]. This visit type was conducted due to national recommendations for restrictions regarding the COVID-19 Pandemic (e.g. social distancing).  This format is felt to be most appropriate for this patient at this time.  All issues noted in this document were discussed and addressed.

## 2019-09-03 ENCOUNTER — Ambulatory Visit: Payer: Medicare Other | Attending: Internal Medicine

## 2019-09-03 DIAGNOSIS — Z23 Encounter for immunization: Secondary | ICD-10-CM | POA: Insufficient documentation

## 2019-09-03 NOTE — Progress Notes (Signed)
   Covid-19 Vaccination Clinic  Name:  Jordan Mccullough    MRN: [id number] DOB: Jul 03, 1941  09/03/2019  [name] was observed post Covid-19 immunization for 15 minutes without incidence. He was provided with Vaccine Information Sheet and instruction to access the V-Safe system.   [name] was instructed to call 911 with any severe reactions post vaccine: Marland Kitchen Difficulty breathing  . Swelling of your face and throat  . A fast heartbeat  . A bad rash all over your body  . Dizziness and weakness    Immunizations Administered    Name Date Dose VIS Date Route   Pfizer COVID-19 Vaccine 09/03/2019 10:55 AM 0.3 mL 08/01/2019 Intramuscular   Manufacturer: [place]   Lot: [id number]   [place]: [address]-5

## 2019-09-12 ENCOUNTER — Encounter: Payer: Self-pay | Admitting: Podiatry

## 2019-09-12 ENCOUNTER — Other Ambulatory Visit: Payer: Self-pay

## 2019-09-12 ENCOUNTER — Ambulatory Visit: Payer: Medicare Other | Admitting: Podiatry

## 2019-09-12 DIAGNOSIS — M79674 Pain in right toe(s): Secondary | ICD-10-CM

## 2019-09-12 DIAGNOSIS — B351 Tinea unguium: Secondary | ICD-10-CM | POA: Diagnosis not present

## 2019-09-12 DIAGNOSIS — M79675 Pain in left toe(s): Secondary | ICD-10-CM | POA: Diagnosis not present

## 2019-09-12 NOTE — Patient Instructions (Signed)

## 2019-09-19 NOTE — Progress Notes (Signed)
Subjective: Jordan Mccullough presents today for follow up of painful mycotic nails b/l that are difficult to trim. Pain interferes with ambulation. Aggravating factors include wearing enclosed shoe gear. Pain is relieved with periodic professional debridement.   Allergies  Allergen Reactions  . Quinolones Other (See Comments)    Pt with aneurism: need to avoid quinoline use b/c of weakening of vascular wall that can be associated with quinolones.  . Wellbutrin [Bupropion] Other (See Comments)    AVOID IN PT W/HX OF SEIZURES  . Rosuvastatin Other (See Comments)    Myalgias and dark urine  . Iodinated Diagnostic Agents Hives    1 hive on lt cheek lasting approximately 1 hour on last 2 CT per pt; needs pre meds in future; 50 mg benadryl po 1 hr prior to exam per Dr. Weber Cooks 07/2014  11/2016 per Tery Sanfilippo, pt needs 13hr premeds per our policy.     Objective: There were no vitals filed for this visit.  Vascular Examination:  Capillary refill time to digits immediate b/l, palpable DP pulses b/l, palpable PT pulses b/l, pedal hair present b/l and skin temperature gradient within normal limits b/l  Dermatological Examination: Pedal skin is thin shiny, atrophic bilaterally, no open wounds bilaterally, no interdigital macerations bilaterally and toenails 1-5 b/l elongated, dystrophic, thickened, crumbly with subungual debris  Musculoskeletal: Normal muscle strength 5/5 to all lower extremity muscle groups bilaterally, no gross bony deformities bilaterally and no pain crepitus or joint limitation noted with ROM b/l  Neurological: Protective sensation intact 5/5 intact bilaterally with 10g monofilament b/l and vibratory sensation intact b/l  Assessment: 1. Pain due to onychomycosis of toenails of both feet      Plan: -Toenails 1-5 b/l were debrided in length and girth without iatrogenic bleeding. -Patient to continue soft, supportive shoe gear daily. -Patient to report any pedal injuries to  medical professional immediately. -Patient/POA to call should there be question/concern in the interim.  Return in about 9 weeks (around 11/14/2019) for nail trim.

## 2019-09-23 ENCOUNTER — Ambulatory Visit: Payer: Medicare Other | Attending: Internal Medicine

## 2019-09-23 DIAGNOSIS — Z23 Encounter for immunization: Secondary | ICD-10-CM | POA: Insufficient documentation

## 2019-09-23 NOTE — Progress Notes (Signed)
   Covid-19 Vaccination Clinic  Name:  Jordan Mccullough    MRN: [id number] DOB: [birth year]  09/23/2019  [name] was observed post Covid-19 immunization for 15 minutes without incidence. He was provided with Vaccine Information Sheet and instruction to access the V-Safe system.   [name] was instructed to call 911 with any severe reactions post vaccine: Marland Kitchen Difficulty breathing  . Swelling of your face and throat  . A fast heartbeat  . A bad rash all over your body  . Dizziness and weakness    Immunizations Administered    Name Date Dose VIS Date Route   Pfizer COVID-19 Vaccine 09/23/2019  9:21 AM 0.3 mL 08/01/2019 Intramuscular   Manufacturer: [place]   Lot: [id number]   [place]: [id number]

## 2019-09-29 ENCOUNTER — Encounter: Payer: Self-pay | Admitting: Family Medicine

## 2019-09-29 MED ORDER — METRONIDAZOLE 0.75 % EX GEL: 1.0000 "application " | Freq: Two times a day (BID) | CUTANEOUS | 1 refills | Status: DC

## 2019-09-29 NOTE — Telephone Encounter (Signed)
I did eRx for metrogel for him to apply.

## 2019-09-29 NOTE — Telephone Encounter (Signed)
Patient attached picture of bump on nose. No openings for the rest of the week. Please advise, thanks.

## 2019-10-09 ENCOUNTER — Telehealth (INDEPENDENT_AMBULATORY_CARE_PROVIDER_SITE_OTHER): Payer: Medicare Other | Admitting: Neurology

## 2019-10-09 ENCOUNTER — Encounter: Payer: Self-pay | Admitting: Neurology

## 2019-10-09 ENCOUNTER — Other Ambulatory Visit: Payer: Self-pay

## 2019-10-09 DIAGNOSIS — R202 Paresthesia of skin: Secondary | ICD-10-CM

## 2019-10-09 DIAGNOSIS — G40209 Localization-related (focal) (partial) symptomatic epilepsy and epileptic syndromes with complex partial seizures, not intractable, without status epilepticus: Secondary | ICD-10-CM | POA: Diagnosis not present

## 2019-10-09 DIAGNOSIS — R200 Anesthesia of skin: Secondary | ICD-10-CM | POA: Diagnosis not present

## 2019-10-09 DIAGNOSIS — C7931 Secondary malignant neoplasm of brain: Secondary | ICD-10-CM

## 2019-10-09 MED ORDER — LAMOTRIGINE 200 MG PO TABS: ORAL_TABLET | ORAL | 3 refills | Status: DC

## 2019-10-09 NOTE — Progress Notes (Signed)
Virtual Visit via Video Note The purpose of this virtual visit is to provide medical care while limiting exposure to the novel coronavirus.    Consent was obtained for video visit:  Yes.   Answered questions that patient had about telehealth interaction:  Yes.   I discussed the limitations, risks, security and privacy concerns of performing an evaluation and management service by telemedicine. I also discussed with the patient that there may be a patient responsible charge related to this service. The patient expressed understanding and agreed to proceed.  Pt location: Home Physician Location: office Name of referring provider:  Tammi Sou, MD I connected with Jordan Mccullough at patients initiation/request on 10/09/2019 at 10:00 AM EST by video enabled telemedicine application and verified that I am speaking with the correct person using two identifiers. Pt MRN:  [id number] Pt DOB:  1941-08-02 Video Participants:  Jordan Mccullough   History of Present Illness:  The patient was seen as a virtual video visit on 10/09/2019. He was last seen a year ago for seizures secondary to brain metastases. He continues to do well with no convulsions since 2016, no focal motor seizures affecting the left side of his face since May 2018. He is taking Lamotrigine 200mg  1 tab in AM, 1.5 tabs in PM without side effects. I personally reviewed his most recent MRI brain with and without contrast done 06/2019 which did not show any acute changes, stable treated lesions in the right precentral gyrus and right parietal convexity. Since his last visit, he contacted our office in November to report a strong tingling sensation in his right thumb lasting a few minutes, he reports it is not all the time, sometimes once a week, sometimes once a month. They last 1-2 minutes but they are annoying. No twitching. He has mild chronic weakness on the left side, taking him a few minutes longer to do things with his left side, but no  worsened weakness. He has occasional mild 2/10 headaches lasting an hour with no need for prn medication. No dizziness, diplopia, no falls. Sleep and mood are good.   History on Initial Assessment 10/11/2015: This is a pleasant 79 yo RH man with a history of lung cancer s/p lobectomy, chemotherapy and radiation, hyperlipidemia, CAD s/p MI, peripheral vascular disease, abdominal aortic aneurysm, ad[facility] to [facility] 08/04/15 after a new onset seizure and found to have brain metastases. He states he got his flu shot then went to work, got home and started having a beer the threw up, his eyes got blurry to the point he could not see anything, then has no recollection of events. According to ER notes, after the nausea and vomiting, he decided to drink beer to feel better, his wife left the room then came back to find him with arms extended, shaking, eyes rolled back lasting 5-10 seconds. He recalls coming to without feeling confused, no focal weakness. He was brought to [facility] ER where CT head showed a 1.6 x 1.5 cm hyperdense mass in the right frontal lobe worrisome for focal metastases. MRI was done showing 2 enhancing masses in the right frontal lobe measuring up to 2 x 1.7 cm, no mass effect. He was started on Keppra. He underwent stereotactic radiation.   He called our office in July 2017 to report facial seizures. He had 5 in one day lasting 5-6 seconds, then stopped. His wife called our office to report 4 facial seizures on 7/19 and one on 7/20, these were  lasting 5-6 seconds, he could feel it coming from the left jawline going up to his forehead, with facial twitching seen. No associated headache or confusion. He has a constant feeling of numbness on the left side of his face, "like you had Novocaine." He continues to have left arm and leg weakness that has been chronic, no twitching involved. He feels he has a hard time controlling them. He denies any falls. Keppra dose was increased to 1000mg  qhs.  He denies  any prior history of seizures. His wife denies any staring/unresponsive episodes, no gaps in time, olfactory/gustatory hallucinations, deja vu, rising epigastric sensation, focal numbness/tingling/weakness, myoclonic jerks, headaches, dysarthria/dysphagia, bowel/bladder dysfunction. He was discharged home on Decadron and Keppra, and reports that he was "just totally out of it," functioning only 50%. He felt significantly better getting of Decadron, but continued to have generalized weakness, particularly in the legs, blurred vision, and reported this to his PCP Dr. Anitra Lauth. Keppra dose was reduced to 250mg  BID 5 days ago, and he reports that symptoms are better, but still there. He states that he wakes up feeling fine, but then starts feeling the symptoms after taking morning Keppra dose. He has mild 2-3 over 10 back pain when walking, relieved when he sits down. He continues to drink beer daily but has reduced to 1-2 a day from 3-5 beers a day. His wife reports he would drink 3 beer cases in a week.  Epilepsy Risk Factors: Brain metastases in the right frontal lobe. Otherwise he had a normal birth and early development. There is no history of febrile convulsions, CNS infections such as meningitis/encephalitis, significant traumatic brain injury, neurosurgical procedures, or family history of seizures.   I personally reviewed MRI brain with and without contrast done 08/12/15 which showed an 8x61mm centrally necrotic metastasis in the right posterior frontal lobe; there is a centrally necrotic mass measuring 71mm at the right vertex frontoparietal junction with regional vasogenic edema. Routine EEG 08/05/15 was a normal wake and sleep study  Outpatient Encounter Medications as of 10/09/2019  Medication Sig Note  . albuterol (PROVENTIL HFA;VENTOLIN HFA) 108 (90 Base) MCG/ACT inhaler Inhale 2 puffs into the lungs every 6 (six) hours as needed for wheezing or shortness of breath. 07/30/2019: Pt needs possible  refill  . amLODipine (NORVASC) 5 MG tablet Take 1 tablet (5 mg total) by mouth daily.   Marland Kitchen atorvastatin (LIPITOR) 20 MG tablet Take 1 tablet (20 mg total) by mouth daily.   Marland Kitchen lamoTRIgine (LAMICTAL) 200 MG tablet TAKE 1 TABLET BY MOUTH IN THE MORNING AND 1&1/2 TABLETS IN THE EVENING   . metoprolol tartrate (LOPRESSOR) 50 MG tablet Take 1 tablet (50 mg total) by mouth 2 (two) times daily.   . metroNIDAZOLE (METROGEL) 0.75 % gel Apply 1 application topically 2 (two) times daily.   . Multiple Vitamin (MULTIVITAMIN WITH MINERALS) TABS tablet Take 1 tablet by mouth daily.   . tamsulosin (FLOMAX) 0.4 MG CAPS capsule Take 0.4 mg by mouth daily.   .     .     .     .     .      No facility-administered encounter medications on file as of 10/09/2019.     Observations/Objective:   GEN:  The patient appears stated age and is in NAD.  Neurological examination: Patient is awake, alert, oriented x 3. No aphasia or dysarthria. Intact fluency and comprehension. Remote and recent memory intact. Cranial nerves: Extraocular movements intact with no nystagmus. No facial  asymmetry. Motor: moves all extremities symmetrically, at least anti-gravity x 4. Good finger taps with minimal asymmetry on left. No incoordination on finger to nose testing. Gait: narrow-based and steady.   Assessment and Plan:   This is a pleasant 79 yo RH man with a history of lung cancer s/p lobectomy, radiation, chemotherapy, found to have 2 right frontal brain metastases after he had a convulsive seizure on 08/04/15. Routine EEG normal. Follow-up MRI brain in November 2020 stable. No further convulsions since December 2016. No further focal motor seizures affecting his face since May 2018. He has occasional brief tingling on the right thumb, which would not correlate with his brain lesions, likely peripheral etiology (ie carpal tunnel syndrome), we discussed the option for an EMG/NCV of the right upper extremity, and have agreed to monitor  symptoms and if any worsening, proceed with testing. Continue Lamotrigine 200mg  1 tab in AM, 1.5 tab in PM, refills sent. He is aware of [employer] driving laws to stop driving after a seizure, until 6 months seizure-free. He will follow-up in 1 year and knows to call for any problems.   Follow Up Instructions:   -I discussed the assessment and treatment plan with the patient. The patient was provided an opportunity to ask questions and all were answered. The patient agreed with the plan and demonstrated an understanding of the instructions.   The patient was advised to call back or seek an in-person evaluation if the symptoms worsen or if the condition fails to improve as anticipated.    Jordan Sprang, MD

## 2019-10-25 ENCOUNTER — Other Ambulatory Visit: Payer: Self-pay | Admitting: Family Medicine

## 2019-11-13 ENCOUNTER — Other Ambulatory Visit: Payer: Self-pay

## 2019-11-13 ENCOUNTER — Other Ambulatory Visit: Payer: Self-pay | Admitting: Radiation Therapy

## 2019-11-13 ENCOUNTER — Ambulatory Visit: Payer: Medicare Other | Admitting: Podiatry

## 2019-11-13 ENCOUNTER — Encounter: Payer: Self-pay | Admitting: Podiatry

## 2019-11-13 DIAGNOSIS — M79675 Pain in left toe(s): Secondary | ICD-10-CM

## 2019-11-13 DIAGNOSIS — B351 Tinea unguium: Secondary | ICD-10-CM

## 2019-11-13 DIAGNOSIS — L03032 Cellulitis of left toe: Secondary | ICD-10-CM

## 2019-11-13 DIAGNOSIS — M79674 Pain in right toe(s): Secondary | ICD-10-CM

## 2019-11-13 DIAGNOSIS — C7949 Secondary malignant neoplasm of other parts of nervous system: Secondary | ICD-10-CM

## 2019-11-13 DIAGNOSIS — C7931 Secondary malignant neoplasm of brain: Secondary | ICD-10-CM

## 2019-11-13 NOTE — Progress Notes (Signed)
Subjective:   Patient ID: Jordan Mccullough, male   DOB: 79 y.o.   MRN: [id number]   HPI Patient presents stating he has developed an ingrown toenail of his left big toe with some slight drainage and redness and all his nails are thickened and he is due to go out of town   ROS      Objective:  Physical Exam  Neurovascular status intact with an incurvated left hallux lateral border that is painful when pressed with distal redness noted with all nails thickened and incurvated in the corners impossible for him to cut and sore     Assessment:  Paronychia infection left hallux lateral border localized with mycotic nail infection bilateral     Plan:  H&P reviewed all conditions today I anesthetized the left hallux 60 mg like Marcaine mixture sterile prep applied and I remove the lateral border removed proud flesh abscess tissue and flush the area and allow channel for drainage.  I applied sterile dressing I debrided nailbeds 1-5 both feet and return for routine care to Dr. Adah Perl or earlier if any issues were to occur and instructed on soaks

## 2019-11-18 ENCOUNTER — Ambulatory Visit: Payer: Medicare Other | Admitting: Podiatry

## 2019-11-19 ENCOUNTER — Telehealth: Payer: Self-pay | Admitting: Radiation Therapy

## 2019-11-19 ENCOUNTER — Other Ambulatory Visit: Payer: Self-pay | Admitting: Radiation Therapy

## 2019-11-19 NOTE — Telephone Encounter (Signed)
Called and left a detailed voice message about his upcoming brain MRI and virtual follow-up with Ashlyn in May. Included my contact information to give me a call back if he has any conflicts or concerns.   Mont Dutton R.T.(R)(T) Radiation Special Procedures Navigator

## 2020-01-01 ENCOUNTER — Ambulatory Visit
Admission: RE | Admit: 2020-01-01 | Discharge: 2020-01-01 | Disposition: A | Discharge status: Home / Self Care | Payer: Medicare Other | Source: Ambulatory Visit | Attending: Radiation Oncology | Admitting: Radiation Oncology

## 2020-01-01 DIAGNOSIS — C3490 Malignant neoplasm of unspecified part of unspecified bronchus or lung: Secondary | ICD-10-CM | POA: Diagnosis not present

## 2020-01-01 DIAGNOSIS — C7931 Secondary malignant neoplasm of brain: Secondary | ICD-10-CM

## 2020-01-01 DIAGNOSIS — C7949 Secondary malignant neoplasm of other parts of nervous system: Secondary | ICD-10-CM

## 2020-01-01 MED ORDER — GADOBENATE DIMEGLUMINE 529 MG/ML IV SOLN
13.0000 mL | Freq: Once | INTRAVENOUS | Status: AC | PRN
  Administered 2020-01-01: 13 mL via INTRAVENOUS

## 2020-01-05 ENCOUNTER — Inpatient Hospital Stay: Payer: Medicare Other | Attending: Radiation Oncology

## 2020-01-06 ENCOUNTER — Encounter: Payer: Self-pay | Admitting: Urology

## 2020-01-06 ENCOUNTER — Other Ambulatory Visit: Payer: Self-pay

## 2020-01-07 ENCOUNTER — Other Ambulatory Visit: Payer: Self-pay

## 2020-01-07 ENCOUNTER — Telehealth: Payer: Self-pay | Admitting: Radiation Oncology

## 2020-01-07 ENCOUNTER — Ambulatory Visit
Admission: RE | Admit: 2020-01-07 | Discharge: 2020-01-07 | Disposition: A | Discharge status: Home / Self Care | Payer: Medicare Other | Source: Ambulatory Visit | Attending: Radiation Oncology | Admitting: Radiation Oncology

## 2020-01-07 ENCOUNTER — Encounter: Payer: Self-pay | Admitting: Urology

## 2020-01-07 DIAGNOSIS — Z85841 Personal history of malignant neoplasm of brain: Secondary | ICD-10-CM | POA: Diagnosis not present

## 2020-01-07 DIAGNOSIS — R569 Unspecified convulsions: Secondary | ICD-10-CM | POA: Diagnosis not present

## 2020-01-07 DIAGNOSIS — C3412 Malignant neoplasm of upper lobe, left bronchus or lung: Secondary | ICD-10-CM

## 2020-01-07 DIAGNOSIS — C7931 Secondary malignant neoplasm of brain: Secondary | ICD-10-CM

## 2020-01-07 DIAGNOSIS — C7949 Secondary malignant neoplasm of other parts of nervous system: Secondary | ICD-10-CM

## 2020-01-07 DIAGNOSIS — Z08 Encounter for follow-up examination after completed treatment for malignant neoplasm: Secondary | ICD-10-CM | POA: Diagnosis not present

## 2020-01-07 NOTE — Progress Notes (Signed)
Radiation Oncology         [phone]  Follow Up Evaluation - Conducted via telephone due to current COVID-19 concerns for limiting patient exposure  I spoke with the patient to conduct this visit via telephone to spare the patient unnecessary potential exposure in the healthcare setting during the current COVID-19 pandemic. The patient was notified in advance and was offered a [place] meeting to allow for face to face communication but unfortunately reported that they did not have the appropriate resources/technology to support such a visit and instead preferred to proceed with a telephone conversation.  ________________________________  Name: Jordan Mccullough MRN: [id number]  Date: 01/07/2020  DOB: 1940/11/17  Follow-Up Visit Note  CC: Tammi Sou, MD    Diagnosis:   Metastatic Lung Cancer  Interval Since Radiotherapy: 4 years, 4 months  08/25/15 SRS Treatment:    1.  PTV1 Rt Vertex Frontoparietal 16mm target was treated using 4 Arcs to a prescription dose of 18 Gy. ExacTrac Snap verification was performed for each couch angle.  2.  PTV2  Rt Posterior Frontal Lobe 21mm target was treated using 4 Arcs to a prescription dose of 20 Gy. ExacTrac Snap verification was performed for each couch angle.  Narrative:   Jordan Mccullough is a very pleasant 79 y.o. gentleman with a history of metastatic non-small cell lung cancer who received SRS to 2 lesions in the frontoparietal region which he completed in January of 2017. He has had stability in the brain since his treatment. He was treated with trental/vitamine E for radionecrosis which has resolved. He continues to be NED as well on CT restaging scans and follows in observation with Dr. Julien Nordmann. He reports that he continues to be followed by Dr. Lovena Neighbours at [facility] urology for his elevated PSA.  He did have a negative biopsy in August 2019.  He had a recent MRI on 01/01/20 that revealed stable post treatment changes of the brain without new disease. He is  due for follow up with medical oncology as well.   On review of systems, the patient reports that he is doing well overall. He denies any concerns with headaches, visual changes, seizure activity, or difficulty with balance or gait. No other complaints are verbalized.  Past Medical History:  Past Medical History:  Diagnosis Date  . AAA (abdominal aortic aneurysm) (HCC)    s/p repair (aortoiliac bipass; with persistent endograft leak in inferior mesenteric artery)--stable as of 01/2017 vascular f/u.  Marland Kitchen Asymptomatic cholelithiasis 07/2015   Incidental finding on PET CT  . Back pain 04/19/2016  . BPH with elevated PSA    PSA signif rise 11/2017 (5.4 in 2015 to 31.21 November 2017. Bx 03/2018 benign.  . Coronary artery disease    MI 1992, S/P  PTCA; negative stress test in November 2011 with no ischemia.   . Diverticulosis   . Elevated PSA 01/2018   01/22/18:  PSA 26.5.  Prostate MRI with abnormality->subsequent prostate bx BENIGN 04/12/18.  Rpt PSA 03/07/18 stable at 24.1-->f/u 1 yr with urologist.  . [name] hematuria 05/2018   Occurred 2+ mo's after prostate bx: attributed to bx by urol, treated empirically with keflex.  Urol initially suspected gross hem was sequela of recent prost bx. BUT recurrence of gross hem prompted full hematuria w/u 11/15/18.  Marland Kitchen Hearing loss    Bilateral   . History of radiation therapy 11/10/13-12/12/13   lung,50Gy/39fx  . Hyperlipidemia    Crestor= myalgias  . Hypertension   . Lung cancer (  [place]) 05/2013   non-small cell;  L upper lobectomy with mediastinal LN dissection, chemo, and radiation in 2014/2015. Remission until pt had questionable seizure 07/2015--MRI showed brain mets; palliative brain rad (stereotactic radiation therapy) started 08/25/15.  CT C/A/P clear 02/2016, 05/2016, 11/2016, 12/2018. MRI brain 12/2018 stable lesions/no new mets. Rpt 6 mo per onc.  . Malignant neoplasm of upper lobe, left bronchus or lung ([place]) 10/29/2013   *?New spiculated 4.5cm mass in L upper  lung field on CXR at [facility] in [place] 07/14/17.  Dr. Julien Nordmann (onc) recommended f/u CXR after abx course.  If mass unchanged then repeat CT chest.  Surveillance CT chest and MRI brain 06/2018 show no sign of dz.  . Myocardial infarction [facility]) 1992   Dr Angelena Form    . Peripheral vascular disease ([place]) 08/2012   4.8x4.6 cm infrarenal abdominal aortic fusiform aneurysm, 1.5 cm right common iliac artery aneurysm.  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  Marland Kitchen Pneumonia 09/06/2018   XRAY was done at [facility].  . S/P radiation therapy [facility] 08/25/15   Stereotactic radiation therapy: frontoparietal 18gy,posterior frontal lobe 20gy,  . Seizure disorder ([place]) 2016/2017   as sequela of brain mets;  Grand mal seizure 07/2015, then got on keppra and was seizure-free until focal motor seizures of L side of face began 02/2016- these responded well to up-titration of keppra but pt had adverse side effects so eventually pt had to be switched over to lamictal 07/2016--stable/seizure free on this med as of 09/2018 neuro f/u.  Marland Kitchen Shortness of breath   . Tobacco dependence    "Quit" 1992, but pt has smoked "on and off" since that time    Past Surgical History: Past Surgical History:  Procedure Laterality Date  . ABDOMINAL AORTIC ENDOVASCULAR STENT GRAFT N/A 05/07/2014   Procedure: ABDOMINAL AORTIC ENDOVASCULAR STENT GRAFT;  Surgeon: Serafina Mitchell, MD;  Location: [facility];  Service: Vascular;  Laterality: N/A;  . Carotid duplex dopplers  07/2015   1-39% on R, no signif dz noted on L  . COLONOSCOPY W/ POLYPECTOMY  2003    negative 2010,due 2020; Dr Olevia Perches  . CORONARY ANGIOPLASTY     no stents  . CYSTOSCOPY/RETROGRADE/URETEROSCOPY Bilateral 10/09/2012   Procedure: BILATERAL RETROGRADE bladder and urethral BIOPSY ;  Surgeon: Molli Hazard, MD;  Location: [facility];  Service: Urology;   Laterality: Bilateral;  BILATERAL RETROGRADE   . EEG  08/05/15   Pt placed on keppra just prior to this test due to having ? seizure (MRI showed brain mets)  . HERNIA REPAIR Bilateral    Inguinal  . INGUINAL HERIIORRHAPHY BILATERALLY    . IR ANGIOGRAM SELECTIVE EACH ADDITIONAL VESSEL  12/03/2017  . IR ANGIOGRAM VISCERAL SELECTIVE  12/03/2017  . IR ANGIOGRAM VISCERAL SELECTIVE  12/03/2017  . IR EMBO ARTERIAL NOT HEMORR HEMANG INC GUIDE ROADMAPPING  12/03/2017  . IR GENERIC HISTORICAL  07/20/2016   IR RADIOLOGIST EVAL & MGMT 07/20/2016 [facility] INTERV RAD  . IR GENERIC HISTORICAL  08/02/2016   IR RADIOLOGIST EVAL & MGMT 08/02/2016 Sandi Mariscal, MD [facility] INTERV RAD  . IR RADIOLOGIST EVAL & MGMT  11/08/2017  . IR RADIOLOGIST EVAL & MGMT  01/01/2018  . IR RADIOLOGIST EVAL & MGMT  08/27/2019  . IR US GUIDE VASC ACCESS RIGHT  12/03/2017  . MEDIASTINOSCOPY N/A 05/01/2013   Procedure: MEDIASTINOSCOPY;  Surgeon: Gaye Pollack,  MD;  Location: [facility];  Service: Thoracic;  Laterality: N/A;  . PFTs  04/2013   Minimal obstructive airway disease  . PILONIDAL CYST EXCISION    . PROSTATE BIOPSY N/A 10/09/2012   NEG bx 04/12/18 as well.  Procedure: PROSTATIC URETHRAL BIOPSY--BPH--no evidence of malignancy;  Surgeon: Molli Hazard, MD;  Location: [facility];  Service: Urology;  Laterality: N/A;  PROSTATIC URETHRAL BIOPSY  . PROSTATE BIOPSY  03/2018   NEG  . PTCA  1992  . ROTATOR CUFF REPAIR     Bilateral  . THOROCOTOMY WITH LOBECTOMY Left 05/29/2013   Procedure: LEFT THOROCOTOMY WITH LEFT UPPER LOBE LOBECTOMY;  Surgeon: Gaye Pollack, MD;  Location: [facility];  Service: Thoracic;  Laterality: Left;  . TRANSTHORACIC ECHOCARDIOGRAM  07/2015   EF 50-55%, hypokin of inf myoc, grd I DD, mild MR, mod dilat of LA.  Marland Kitchen VIDEO BRONCHOSCOPY N/A 05/01/2013   Procedure: VIDEO BRONCHOSCOPY;  Surgeon: Gaye Pollack, MD;  Location: [facility];  Service: Thoracic;  Laterality: N/A;    Social History:  Social History   Socioeconomic  History  . Marital status: Married    Spouse name: Not on file  . Number of children: Not on file  . Years of education: Not on file  . Highest education level: Not on file  Occupational History  . Occupation: Retired Estate manager/land agent  . Smoking status: Current Every Day Smoker    Packs/day: 1.00    Years: 24.00    Pack years: 24.00    Types: Cigarettes    Last attempt to quit: 05/28/2013    Years since quitting: 6.6  . Smokeless tobacco: Never Used  . Tobacco comment: QUIT 15 YEARS AGO-07/03/17 smoking  Substance and Sexual Activity  . Alcohol use: Yes    Alcohol/week: 15.0 standard drinks    Types: 3 Glasses of wine, 12 Cans of beer per week    Comment: 1-2 beers a day  . Drug use: No  . Sexual activity: Not on file  Other Topics Concern  . Not on file  Social History Narrative   HEART HEALTHY DIET.     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   MARRIED, 2 children who live in [place].  Smoked on and off since then.   ALCOHOL USE -YES- RED WINE SOCIALLY, couple of beers at night usually.   07-08-18 Unable to ask abuse questions wife with him today.   Social Determinants of Health   Financial Resource Strain:   . Difficulty of Paying Living Expenses:   Food Insecurity:   . Worried About Charity fundraiser in the Last Year:   . Arboriculturist in the Last Year:   Transportation Needs:   . Film/video editor (Medical):   Marland Kitchen Lack of Transportation (Non-Medical):   Physical Activity:   . Days of Exercise per Week:   . Minutes of Exercise per Session:   Stress:   . Feeling of Stress :   Social Connections:   . Frequency of Communication with Friends and Family:   . Frequency of Social Gatherings with Friends and Family:   . Attends Religious Services:   . Active Member of Clubs or Organizations:   . Attends Archivist Meetings:   Marland Kitchen Marital Status:   Intimate Partner Violence:   . Fear of Current or Ex-Partner:   . Emotionally  Abused:   Marland Kitchen Physically Abused:   . Sexually Abused:  The patient is from [place] originally. He enjoys flying planes but has not flown since his seizures. He does still do mechanical work on them.  He and his wife Gwenette Greet live in [place].  Family History: Family History  Problem Relation Age of Onset  . Hypertension Mother   . Prostate cancer Father        Prostate cancer  . Cancer Father        Brain Tumor  . Lung cancer Sister        NON SMOKER  . Cancer Sister   . Hyperlipidemia Sister   . Hyperlipidemia Brother   . Heart attack Brother   . Hypertension Brother   . Prostate cancer Brother   . Diabetes Neg Hx   . Stroke Neg Hx     ALLERGIES:  is allergic to quinolones; wellbutrin [bupropion]; rosuvastatin; and iodinated diagnostic agents.  Meds: Current Outpatient Medications  Medication Sig Dispense Refill  . albuterol (PROVENTIL HFA;VENTOLIN HFA) 108 (90 Base) MCG/ACT inhaler Inhale 2 puffs into the lungs every 6 (six) hours as needed for wheezing or shortness of breath. 18 g 1  . amLODipine (NORVASC) 5 MG tablet Take 1 tablet (5 mg total) by mouth daily. 90 tablet 1  . atorvastatin (LIPITOR) 20 MG tablet Take 1 tablet (20 mg total) by mouth daily. 90 tablet 3  . lamoTRIgine (LAMICTAL) 200 MG tablet TAKE 1 TABLET BY MOUTH IN THE MORNING AND 1&1/2 TABLETS IN THE EVENING 225 tablet 3  . metoprolol tartrate (LOPRESSOR) 50 MG tablet TAKE 1 TABLET BY MOUTH TWICE A DAY 180 tablet 0  . metroNIDAZOLE (METROGEL) 0.75 % gel Apply 1 application topically 2 (two) times daily. 45 g 1  . Multiple Vitamin (MULTIVITAMIN WITH MINERALS) TABS tablet Take 1 tablet by mouth daily.    . tamsulosin (FLOMAX) 0.4 MG CAPS capsule Take 0.4 mg by mouth daily.     No current facility-administered medications for this encounter.    Physical Findings: Unable to assess due to encounter type.  Lab Findings: Lab Results  Component Value Date   WBC 8.2 07/10/2019   HGB 15.9 07/10/2019   HCT  47.4 07/10/2019   MCV 87.9 07/10/2019   PLT 253 07/10/2019     Radiographic Findings: MR Brain W Wo Contrast  Result Date: 01/02/2020 CLINICAL DATA:  Follow-up treated metastatic disease. Lung cancer patient. EXAM: MRI HEAD WITHOUT AND WITH CONTRAST TECHNIQUE: Multiplanar, multiecho pulse sequences of the brain and surrounding structures were obtained without and with intravenous contrast. CONTRAST:  90mL MULTIHANCE GADOBENATE DIMEGLUMINE 529 MG/ML IV SOLN COMPARISON:  07/03/2019. 01/02/2019. 07/04/2018. older studies as distant as 08/05/2015. FINDINGS: Brain: Diffusion imaging does not show any acute or subacute infarction or other cause of restricted diffusion. No abnormality affects the brainstem or cerebellum. Treated metastasis at the right lateral frontoparietal junction appears unchanged with a post treatment cyst measuring 18 x 12 mm, with thin peripheral enhancement. Treated lesion at the right parietal vertex shows volume loss and stable chronic linear enhancement. No new or progressive lesion. No evidence of increasing mass effect or edema. Chronic small-vessel ischemic changes elsewhere throughout the cerebral hemispheric white matter appear stable. No hydrocephalus or extra-axial collection. Vascular: Major vessels at the base of the brain show flow. Skull and upper cervical spine: Negative Sinuses/Orbits: Clear/normal Other: None IMPRESSION: Stable examination. No new or progressive disease. Treated metastases at the right frontoparietal junction and right parietal vertex are stable by imaging without evidence of viable disease. Chronic small-vessel ischemic changes  of the white matter as seen previously. No acute or subacute infarction. Electronically Signed   By: Nelson Chimes M.D.   On: 01/02/2020 09:20    Impression/Plan: 1. Recurrent Metastatic Stage IIIA, T2a, N2, M0, NSCLC, adenocarcinoma of the left upper lobe. The patient is doing very well and we reviewed his MRI results and that  he does not have active disease. He remains in observation with Dr. Julien Nordmann and with [place] in the brain program.  We will plan for repeat MRI in 6 months according to NCCN guidelines or sooner if he has symptoms or concerns. 2. Seizure at presentation.  He continues under the care of Dr. Delice Lesch, we will follow this expectantly and he continues Keppra at her direction.  3. Elevated PSA with BPH.  The patient continues with Dr. Lovena Neighbours at [facility] urology for evaluation of this.  He did have a negative biopsy in August 2019.   Given current concerns for patient exposure during the COVID-19 pandemic, this encounter was conducted via telephone.  The patient has provided two factor identification and has given verbal consent for this type of encounter and has been advised to only accept a meeting of this type in a secure network environment. The time spent during this encounter was 25 minutes including preparation, discussion, and coordination of the patient's care. The attendants for this meeting include  Hayden Pedro  and Jill Alexanders.  During the encounter, Hayden Pedro was located at [facility] Radiation Oncology Department.  Jordan Mccullough was located at home.       Carola Rhine, PAC

## 2020-01-23 ENCOUNTER — Encounter: Payer: Self-pay | Admitting: Family Medicine

## 2020-01-23 NOTE — Telephone Encounter (Signed)
I can refer to dermatologist but it typically takes a couple months to get appt. I'd like to see the rash in-person (I've only seen a picture of it sent in [employer]") b/c I'd like to make sure I have the correct diagnosis, PLUS I could likely try different treatment for him to be doing while awaiting the derm appt. Basically, he needs in-person o/v-thx

## 2020-01-23 NOTE — Telephone Encounter (Signed)
Patient was given metrogel on 09/29/19 (45g,1) to use on his nose but has not helped much. He would like referral for dermatology.   Please advise, thanks.

## 2020-01-28 ENCOUNTER — Other Ambulatory Visit: Payer: Self-pay

## 2020-01-28 ENCOUNTER — Encounter: Payer: Self-pay | Admitting: Podiatry

## 2020-01-28 ENCOUNTER — Ambulatory Visit: Payer: Medicare Other | Admitting: Podiatry

## 2020-01-28 VITALS — Temp 96.7°F

## 2020-01-28 DIAGNOSIS — B351 Tinea unguium: Secondary | ICD-10-CM | POA: Diagnosis not present

## 2020-01-28 DIAGNOSIS — M79674 Pain in right toe(s): Secondary | ICD-10-CM | POA: Diagnosis not present

## 2020-01-28 DIAGNOSIS — M79675 Pain in left toe(s): Secondary | ICD-10-CM | POA: Diagnosis not present

## 2020-01-28 MED ORDER — NONFORMULARY OR COMPOUNDED ITEM: 3 refills | Status: DC

## 2020-01-28 NOTE — Patient Instructions (Addendum)
[facility] [phone] Antifungal nail solution  Onychomycosis/Fungal Toenails  WHAT IS IT? An infection that lies within the keratin of your nail plate that is caused by a fungus.  WHY ME? Fungal infections affect all ages, sexes, races, and creeds.  There may be many factors that predispose you to a fungal infection such as age, coexisting medical conditions such as diabetes, or an autoimmune disease; stress, medications, fatigue, genetics, etc.  Bottom line: fungus thrives in a warm, moist environment and your shoes offer such a location.  IS IT CONTAGIOUS? Theoretically, yes.  You do not want to share shoes, nail clippers or files with someone who has fungal toenails.  Walking around barefoot in the same room or sleeping in the same bed is unlikely to transfer the organism.  It is important to realize, however, that fungus can spread easily from one nail to the next on the same foot.  HOW DO WE TREAT THIS?  There are several ways to treat this condition.  Treatment may depend on many factors such as age, medications, pregnancy, liver and kidney conditions, etc.  It is best to ask your doctor which options are available to you.  1. No treatment.   Unlike many other medical concerns, you can live with this condition.  However for many people this can be a painful condition and may lead to ingrown toenails or a bacterial infection.  It is recommended that you keep the nails cut short to help reduce the amount of fungal nail. 2. Topical treatment.  These range from herbal remedies to prescription strength nail lacquers.  About 40-50% effective, topicals require twice daily application for approximately 9 to 12 months or until an entirely new nail has grown out.  The most effective topicals are medical grade medications available through physicians offices. 3. Oral antifungal medications.  With an 80-90% cure rate, the most common oral medication requires 3 to 4 months of therapy and stays  in your system for a year as the new nail grows out.  Oral antifungal medications do require blood work to make sure it is a safe drug for you.  A liver function panel will be performed prior to starting the medication and after the first month of treatment.  It is important to have the blood work performed to avoid any harmful side effects.  In general, this medication safe but blood work is required. 4. Laser Therapy.  This treatment is performed by applying a specialized laser to the affected nail plate.  This therapy is noninvasive, fast, and non-painful.  It is not covered by insurance and is therefore, out of pocket.  The results have been very good with a 80-95% cure rate.  [facility] is the only practice in the area to offer this therapy. 5. Permanent Nail Avulsion.  Removing the entire nail so that a new nail will not grow back. 

## 2020-01-30 NOTE — Progress Notes (Signed)
Subjective: Jordan Mccullough is a 79 y.o. male patient seen today painful mycotic nails b/l that are difficult to trim. Pain interferes with ambulation. Aggravating factors include wearing enclosed shoe gear. Pain is relieved with periodic professional debridement.   Patient states he had to come in the office in March because his left great toe was ingrown and painful. He was able to see Dr. Paulla Mccullough,  who performed a partial nail avulsion of the lateral border with complete relief of symptoms. He states left great toe feels much better.  He would like to discuss treatment options for mycotic toenails.  Patient Active Problem List   Diagnosis Date Noted  . Dysphonia 06/05/2019  . 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 Back pain 04/19/2016  . Seizure disorder ([place]) 04/03/2016  . DNR (do not resuscitate)   . Palliative care encounter   . Complaints of leg weakness 11/08/2015  . 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 seizures, not intractable, without status epilepticus ([place]) 10/11/2015  . Syncope 08/05/2015  . Seizure-like activity ([place]) 08/04/2015  . Brain metastases ([place]) 08/04/2015  . Seizure ([place]) 08/04/2015  . AAA (abdominal aortic aneurysm) without rupture ([place]) 06/21/2015  . Aftercare following surgery of the circulatory system 06/21/2015  . Essential hypertension 06/23/2014  . Primary cancer of left upper lobe of lung ([place]) 06/19/2013  . History of AAA (abdominal aortic aneurysm) repair 10/14/2012  . Aneurysm of common iliac artery ([place]) 10/14/2012  . Elevated PSA 07/10/2012  . CAD (coronary artery disease) 07/31/2011  . DIVERTICULOSIS, COLON 07/05/2010  . COLONIC POLYPS 02/24/2010  . AMBLYOPIA, HX OF 02/24/2010  . HYPERGLYCEMIA, FASTING 05/15/2008  . Hyperlipidemia 05/23/2007  . MYOCARDIAL INFARCTION, HX OF 05/23/2007    Current Outpatient Medications on File Prior to Visit  Medication Sig Dispense Refill  . albuterol  (PROVENTIL HFA;VENTOLIN HFA) 108 (90 Base) MCG/ACT inhaler Inhale 2 puffs into the lungs every 6 (six) hours as needed for wheezing or shortness of breath. 18 g 1  . amLODipine (NORVASC) 5 MG tablet Take 1 tablet (5 mg total) by mouth daily. 90 tablet 1  . atorvastatin (LIPITOR) 20 MG tablet Take 1 tablet (20 mg total) by mouth daily. 90 tablet 3  . lamoTRIgine (LAMICTAL) 200 MG tablet TAKE 1 TABLET BY MOUTH IN THE MORNING AND 1&1/2 TABLETS IN THE EVENING 225 tablet 3  . metoprolol tartrate (LOPRESSOR) 50 MG tablet TAKE 1 TABLET BY MOUTH TWICE A DAY 180 tablet 0  . metroNIDAZOLE (METROGEL) 0.75 % gel Apply 1 application topically 2 (two) times daily. 45 g 1  . Multiple Vitamin (MULTIVITAMIN WITH MINERALS) TABS tablet Take 1 tablet by mouth daily.    . tamsulosin (FLOMAX) 0.4 MG CAPS capsule Take 0.4 mg by mouth daily.     No current facility-administered medications on file prior to visit.    Allergies  Allergen Reactions  . Quinolones Other (See Comments)    Pt with aneurism: need to avoid quinoline use b/c of weakening of vascular wall that can be associated with quinolones.  . Wellbutrin [Bupropion] Other (See Comments)    AVOID IN PT W/HX OF SEIZURES  . Rosuvastatin Other (See Comments)    Myalgias and dark urine  . Iodinated Diagnostic Agents Hives    1 hive on lt cheek lasting approximately 1 hour on last 2 CT per pt; needs pre meds in future; 50 mg benadryl po 1 hr prior to exam per Dr. Weber Cooks 07/2014  11/2016 per Tery Sanfilippo, pt  needs 13hr premeds per our policy.    Objective: Physical Exam  General: Patient is a pleasant 79 y.o. Caucasian male in no acute distress, Awake, alert and oriented x 3  Neurovascular status unchanged b/l.  Capillary refill time to digits immediate b/l. Palpable DP pulses b/l. Palpable PT pulses b/l. Pedal hair present b/l. Skin temperature gradient within normal limits b/l. No pain with calf compression b/l. No edema noted b/l.   Protective sensation  intact 5/5 intact bilaterally with 10g monofilament b/l. Vibratory sensation intact b/l. Proprioception intact bilaterally.  Dermatological:  Pedal skin with normal turgor, texture and tone bilaterally. No open wounds bilaterally. No interdigital macerations bilaterally. Toenails 1-5 b/l elongated, discolored, dystrophic, thickened, crumbly with subungual debris and tenderness to dorsal palpation.  Musculoskeletal:  Normal muscle strength 5/5 to all lower extremity muscle groups bilaterally. No pain crepitus or joint limitation noted with ROM b/l. No gross bony deformities bilaterally.  Assessment and Plan:  1. Pain due to onychomycosis of toenails of both feet    -Examined patient. -No new findings. No new orders. -Discussed topical, laser and oral medication. Patient opted for topical treatment with compounded medication. Rx written for nonformulary compounding topical antifungal: [facility]: Antifungal cream - Terbinafine 3%, Fluconazole 2%, Tea Tree Oil 5%, Urea 10%, Ibuprofen 2% in DMSO Suspension #2ml. Apply to the affected nail(s) at bedtime. -Toenails 1-5 b/l were debrided in length and girth with sterile nail nippers and dremel without iatrogenic bleeding.  -Patient to report any pedal injuries to medical professional immediately. -Patient to continue soft, supportive shoe gear daily. -Patient/POA to call should there be question/concern in the interim.  Return in about 3 months (around 04/29/2020) for nail trim.  Marzetta Board, DPM

## 2020-01-31 ENCOUNTER — Other Ambulatory Visit: Payer: Self-pay | Admitting: Family Medicine

## 2020-02-24 ENCOUNTER — Other Ambulatory Visit: Payer: Self-pay | Admitting: Family Medicine

## 2020-02-24 DIAGNOSIS — Z961 Presence of intraocular lens: Secondary | ICD-10-CM | POA: Diagnosis not present

## 2020-03-13 ENCOUNTER — Other Ambulatory Visit: Payer: Self-pay | Admitting: Family Medicine

## 2020-03-17 ENCOUNTER — Other Ambulatory Visit: Payer: Self-pay | Admitting: Family Medicine

## 2020-03-19 ENCOUNTER — Other Ambulatory Visit: Payer: Self-pay

## 2020-03-23 ENCOUNTER — Encounter: Payer: Self-pay | Admitting: Family Medicine

## 2020-03-23 ENCOUNTER — Ambulatory Visit (INDEPENDENT_AMBULATORY_CARE_PROVIDER_SITE_OTHER): Payer: Medicare Other | Admitting: Family Medicine

## 2020-03-23 ENCOUNTER — Other Ambulatory Visit: Payer: Self-pay

## 2020-03-23 VITALS — BP 138/78 | HR 66 | Temp 97.9°F | Resp 16 | Ht 63.0 in | Wt 139.2 lb

## 2020-03-23 DIAGNOSIS — I10 Essential (primary) hypertension: Secondary | ICD-10-CM | POA: Diagnosis not present

## 2020-03-23 DIAGNOSIS — L989 Disorder of the skin and subcutaneous tissue, unspecified: Secondary | ICD-10-CM

## 2020-03-23 DIAGNOSIS — E7800 Pure hypercholesterolemia, unspecified: Secondary | ICD-10-CM | POA: Diagnosis not present

## 2020-03-23 DIAGNOSIS — N401 Enlarged prostate with lower urinary tract symptoms: Secondary | ICD-10-CM

## 2020-03-23 DIAGNOSIS — R972 Elevated prostate specific antigen [PSA]: Secondary | ICD-10-CM | POA: Diagnosis not present

## 2020-03-23 DIAGNOSIS — K4090 Unilateral inguinal hernia, without obstruction or gangrene, not specified as recurrent: Secondary | ICD-10-CM

## 2020-03-23 DIAGNOSIS — N138 Other obstructive and reflux uropathy: Secondary | ICD-10-CM

## 2020-03-23 DIAGNOSIS — Z125 Encounter for screening for malignant neoplasm of prostate: Secondary | ICD-10-CM

## 2020-03-23 LAB — COMPREHENSIVE METABOLIC PANEL
ALT: 16 U/L (ref 0–53)
AST: 15 U/L (ref 0–37)
Albumin: 4.2 g/dL (ref 3.5–5.2)
Alkaline Phosphatase: 58 U/L (ref 39–117)
BUN: 16 mg/dL (ref 6–23)
CO2: 29 mEq/L (ref 19–32)
Calcium: 9.7 mg/dL (ref 8.4–10.5)
Chloride: 105 mEq/L (ref 96–112)
Creatinine, Ser: 0.86 mg/dL (ref 0.40–1.50)
GFR: 85.97 mL/min (ref >60.00)
Glucose, Bld: 93 mg/dL (ref 70–99)
Potassium: 4.6 mEq/L (ref 3.5–5.1)
Sodium: 138 mEq/L (ref 135–145)
Total Bilirubin: 0.4 mg/dL (ref 0.2–1.2)
Total Protein: 6.8 g/dL (ref 6.0–8.3)

## 2020-03-23 LAB — LIPID PANEL
Cholesterol: 130 mg/dL (ref 0–200)
HDL: 39.1 mg/dL (ref >39.00)
LDL Cholesterol: 64 mg/dL (ref 0–99)
NonHDL: 91.28
Total CHOL/HDL Ratio: 3
Triglycerides: 138 mg/dL (ref 0.0–149.0)
VLDL: 27.6 mg/dL (ref 0.0–40.0)

## 2020-03-23 LAB — PSA, MEDICARE: PSA: 35.75 ng/ml — ABNORMAL HIGH (ref 0.10–4.00)

## 2020-03-23 NOTE — Progress Notes (Signed)
OFFICE VISIT  03/23/2020   CC:  Chief Complaint  Patient presents with  . Follow-up    RCI, pt is fasting   HPI:    Patient is a 80 y.o. Caucasian male who presents for f/u HTN, HLD, BPH with hx of elevated PSA.  Has noticed bulge in L groin last 2 mo or so, no pain.   Has hx of remote bilat hernia repairs.  He decided not to get his vocal cord surgery after all.  He is just dealing with his hoarseness.  He did not want the stress of surg and hosp stay.  Has bp cuff but does not monitor this at home.  BPH: has some morning hesitancy but after that has no abnormal sx's.  Taking flomax. Has not seen his urologist in > 84yr.  Hx of elevated PSA, bx neg x 2.  PSA stable at last check 2019.    Has been stable on lamictal, no seizures.  ROS: no fevers, no CP, no SOB, no wheezing, no cough, no dizziness, no HAs, no rashes, no melena/hematochezia.  No polyuria or polydipsia.  No myalgias or arthralgias.  No focal weakness, paresthesias, or tremors.  No acute vision or hearing abnormalities. No n/v/d or abd pain.  No palpitations.    Past Medical History:  Diagnosis Date  . AAA (abdominal aortic aneurysm) (HCC)    s/p repair (aortoiliac bipass; with persistent endograft leak in inferior mesenteric artery)--stable as of 01/2017 vascular f/u.  Marland Kitchen Asymptomatic cholelithiasis 07/2015   Incidental finding on PET CT  . Back pain 04/19/2016  . BPH with elevated PSA    PSA signif rise 11/2017 (5.4 in 2015 to 31.21 November 2017. Bx 03/2018 benign.  . Coronary artery disease    MI 1992, S/P  PTCA; negative stress test in November 2011 with no ischemia.   . Diverticulosis   . Elevated PSA 01/2018   01/22/18:  PSA 26.5.  Prostate MRI with abnormality->subsequent prostate bx BENIGN 04/12/18.  Rpt PSA 03/07/18 stable at 24.1-->f/u 1 yr with urologist.  . [name] hematuria 05/2018   Occurred 2+ mo's after prostate bx: attributed to bx by urol, treated empirically with keflex.  Urol initially suspected gross hem  was sequela of recent prost bx. BUT recurrence of gross hem prompted full hematuria w/u 11/15/18.  Marland Kitchen Hearing loss    Bilateral   . History of radiation therapy 11/10/13-12/12/13   lung,50Gy/67fx  . Hyperlipidemia    Crestor= myalgias  . Hypertension   . Lung cancer ([place]) 05/2013   non-small cell;  L upper lobectomy with mediastinal LN dissection, chemo, and radiation in 2014/2015. Remission until pt had questionable seizure 07/2015--MRI showed brain mets; palliative brain rad (stereotactic radiation therapy) started 08/25/15.  CT C/A/P clear 02/2016, 05/2016, 11/2016, 12/2018. MRI brain 12/2018 stable lesions/no new mets. Rpt 6 mo per onc.  . Malignant neoplasm of upper lobe, left bronchus or lung ([place]) 10/29/2013   *?New spiculated 4.5cm mass in L upper lung field on CXR at [facility] in [place] 07/14/17.  Dr. Julien Nordmann (onc) recommended f/u CXR after abx course.  If mass unchanged then repeat CT chest.  Surveillance CT chest and MRI brain 06/2018 show no sign of dz.  . Myocardial infarction [facility]) 1992   Dr Angelena Form    . Peripheral vascular disease ([place]) 08/2012   4.8x4.6 cm infrarenal abdominal aortic fusiform aneurysm, 1.5 cm right common iliac artery aneurysm.  Type II endoleak from inferior mesenteric artery--Vasc surg referred pt to  interv rad for possible embolization of the leak as of 07/17/16.  Marland Kitchen Pneumonia 09/06/2018   XRAY was done at [facility].  . S/P radiation therapy [facility] 08/25/15   Stereotactic radiation therapy: frontoparietal 18gy,posterior frontal lobe 20gy,  . Seizure disorder ([place]) 2016/2017   as sequela of brain mets;  Grand mal seizure 07/2015, then got on keppra and was seizure-free until focal motor seizures of L side of face began 02/2016- these responded well to up-titration of keppra but pt had adverse side effects so eventually pt had to be switched over to lamictal 07/2016--stable/seizure free on this med as of 09/2018 neuro f/u.  Marland Kitchen Shortness of breath    . Tobacco dependence    "Quit" 1992, but pt has smoked "on and off" since that time    Past Surgical History:  Procedure Laterality Date  . ABDOMINAL AORTIC ENDOVASCULAR STENT GRAFT N/A 05/07/2014   Procedure: ABDOMINAL AORTIC ENDOVASCULAR STENT GRAFT;  Surgeon: Serafina Mitchell, MD;  Location: [facility];  Service: Vascular;  Laterality: N/A;  . Carotid duplex dopplers  07/2015   1-39% on R, no signif dz noted on L  . COLONOSCOPY W/ POLYPECTOMY  2003    negative 2010,due 2020; Dr Olevia Perches  . CORONARY ANGIOPLASTY     no stents  . CYSTOSCOPY/RETROGRADE/URETEROSCOPY Bilateral 10/09/2012   Procedure: BILATERAL RETROGRADE bladder and urethral BIOPSY ;  Surgeon: Molli Hazard, MD;  Location: [facility];  Service: Urology;  Laterality: Bilateral;  BILATERAL RETROGRADE   . EEG  08/05/15   Pt placed on keppra just prior to this test due to having ? seizure (MRI showed brain mets)  . HERNIA REPAIR Bilateral    Inguinal  . INGUINAL HERIIORRHAPHY BILATERALLY    . IR ANGIOGRAM SELECTIVE EACH ADDITIONAL VESSEL  12/03/2017  . IR ANGIOGRAM VISCERAL SELECTIVE  12/03/2017  . IR ANGIOGRAM VISCERAL SELECTIVE  12/03/2017  . IR EMBO ARTERIAL NOT HEMORR HEMANG INC GUIDE ROADMAPPING  12/03/2017  . IR GENERIC HISTORICAL  07/20/2016   IR RADIOLOGIST EVAL & MGMT 07/20/2016 [facility] INTERV RAD  . IR GENERIC HISTORICAL  08/02/2016   IR RADIOLOGIST EVAL & MGMT 08/02/2016 Sandi Mariscal, MD [facility] INTERV RAD  . IR RADIOLOGIST EVAL & MGMT  11/08/2017  . IR RADIOLOGIST EVAL & MGMT  01/01/2018  . IR RADIOLOGIST EVAL & MGMT  08/27/2019  . IR US GUIDE VASC ACCESS RIGHT  12/03/2017  . MEDIASTINOSCOPY N/A 05/01/2013   Procedure: MEDIASTINOSCOPY;  Surgeon: Gaye Pollack, MD;  Location: [facility] OR;  Service: Thoracic;  Laterality: N/A;  . PFTs  04/2013   Minimal obstructive airway disease  . PILONIDAL CYST EXCISION    . PROSTATE BIOPSY N/A 10/09/2012   NEG bx 04/12/18 as well.  Procedure: PROSTATIC URETHRAL BIOPSY--BPH--no evidence of  malignancy;  Surgeon: Molli Hazard, MD;  Location: [facility];  Service: Urology;  Laterality: N/A;  PROSTATIC URETHRAL BIOPSY  . PROSTATE BIOPSY  03/2018   NEG  . PTCA  1992  . ROTATOR CUFF REPAIR     Bilateral  . THOROCOTOMY WITH LOBECTOMY Left 05/29/2013   Procedure: LEFT THOROCOTOMY WITH LEFT UPPER LOBE LOBECTOMY;  Surgeon: Gaye Pollack, MD;  Location: [facility];  Service: Thoracic;  Laterality: Left;  . TRANSTHORACIC ECHOCARDIOGRAM  07/2015   EF 50-55%, hypokin of inf myoc, grd I DD, mild MR, mod dilat of LA.  Marland Kitchen VIDEO BRONCHOSCOPY N/A 05/01/2013   Procedure: VIDEO BRONCHOSCOPY;  Surgeon: Gaye Pollack, MD;  Location: [facility];  Service: Thoracic;  Laterality: N/A;    Outpatient Medications Prior to Visit  Medication Sig Dispense Refill  . amLODipine (NORVASC) 5 MG tablet TAKE 1 TABLET (5 MG TOTAL) BY MOUTH DAILY. OFFICE VISIT NEEDED 30 tablet 0  . atorvastatin (LIPITOR) 20 MG tablet Take 1 tablet (20 mg total) by mouth daily. 90 tablet 3  . lamoTRIgine (LAMICTAL) 200 MG tablet TAKE 1 TABLET BY MOUTH IN THE MORNING AND 1&1/2 TABLETS IN THE EVENING 225 tablet 3  . metoprolol tartrate (LOPRESSOR) 50 MG tablet TAKE 1 TABLET BY MOUTH TWICE A DAY 180 tablet 0  . Multiple Vitamin (MULTIVITAMIN WITH MINERALS) TABS tablet Take 1 tablet by mouth daily.    . SYMBICORT 160-4.5 MCG/ACT inhaler INHALE 1 2 PUFFS INTO THE LUNGS EVERY 12 HOURS. GARGLE AND SPIT AFTER USE.    . tamsulosin (FLOMAX) 0.4 MG CAPS capsule Take 0.4 mg by mouth daily.    . metroNIDAZOLE (METROGEL) 0.75 % gel Apply 1 application topically 2 (two) times daily. (Patient not taking: Reported on 03/23/2020) 45 g 1  . NONFORMULARY OR COMPOUNDED ITEM Antifungal solution: Terbinafine 3%, Fluconazole 2%, Tea Tree Oil 5%, Urea 10%, Ibuprofen 2% in DMSO suspension #42mL (Patient not taking: Reported on 03/23/2020) 1 each 3  . albuterol (PROVENTIL HFA;VENTOLIN HFA) 108 (90 Base) MCG/ACT inhaler Inhale 2 puffs into the lungs every 6 (six) hours  as needed for wheezing or shortness of breath. (Patient not taking: Reported on 03/23/2020) 18 g 1   No facility-administered medications prior to visit.    Allergies  Allergen Reactions  . Quinolones Other (See Comments)    Pt with aneurism: need to avoid quinoline use b/c of weakening of vascular wall that can be associated with quinolones.  . Wellbutrin [Bupropion] Other (See Comments)    AVOID IN PT W/HX OF SEIZURES  . Rosuvastatin Other (See Comments)    Myalgias and dark urine  . Iodinated Diagnostic Agents Hives    1 hive on lt cheek lasting approximately 1 hour on last 2 CT per pt; needs pre meds in future; 50 mg benadryl po 1 hr prior to exam per Dr. Weber Cooks 07/2014  11/2016 per Tery Sanfilippo, pt needs 13hr premeds per our policy.    ROS As per HPI  PE: Vitals with BMI 03/23/2020 10/08/2019 08/07/2019  Height 5\' 3"  5\' 3"  -  Weight 139 lbs 3 oz 142 lbs 140 lbs 2 oz  BMI 63.87 56.43 -  Systolic 329 - 518  Diastolic 78 - 72  Pulse 66 - 71   O2 sat on RA today is 96% Gen: Alert, well appearing.  Patient is oriented to person, place, time, and situation. AFFECT: pleasant, lucid thought and speech. CV: RRR, no m/r/g.   LUNGS: CTA bilat, nonlabored resps, good aeration in all lung fields. L groin bulge at level of inguinal ligament, easily reducible when he's supine. Testicles normal, scrotum w/out mass or fluid. SKIN: small papules x 2 on tip of nose, slight superficial desquamation, mild erythema, no pustule or vesicle.  LABS:  Lab Results  Component Value Date   TSH 0.708 08/05/2015   Lab Results  Component Value Date   WBC 8.2 07/10/2019   HGB 15.9 07/10/2019   HCT 47.4 07/10/2019   MCV 87.9 07/10/2019   PLT 253 07/10/2019   Lab Results  Component Value Date   CREATININE 0.90 08/26/2019   BUN 19 07/10/2019   NA 138 07/10/2019   K 4.6 07/10/2019   CL 104 07/10/2019  CO2 21 (L) 07/10/2019   Lab Results  Component Value Date   ALT 14 07/10/2019   AST 12 (L)  07/10/2019   ALKPHOS 78 07/10/2019   BILITOT 0.3 07/10/2019   Lab Results  Component Value Date   CHOL 133 05/09/2019   Lab Results  Component Value Date   HDL 44.10 05/09/2019   Lab Results  Component Value Date   LDLCALC 70 05/09/2019   Lab Results  Component Value Date   TRIG 92.0 05/09/2019   Lab Results  Component Value Date   CHOLHDL 3 05/09/2019   Lab Results  Component Value Date   PSA 24.10 03/07/2018   PSA 26.50 01/23/2018   PSA 31.32 (H) 12/14/2017   Lab Results  Component Value Date   HGBA1C 5.7 07/04/2013   IMPRESSION AND PLAN:  1) L inguinal hernia (direct): easily reducible, asymptomatic. Reassured, monitor. Signs/symptoms to call or return for were reviewed and pt expressed understanding.  2) Skin lesion of nose: decided to refer to derm to see if suspicion of AK/BCC/SCC.  3) HTN: The current medical regimen is effective;  continue present plan and medications. Lytes/cr today.  4) HLD: tolerating statin. FLP and hepatic panel today (he had a bowl of cheerios this morning).  5) BPH with LUT obst sx's: well controlled on flomax 0.4mg  qhs.  6) Elevated PSA, hx of benign bx.  Last PSA 2019 stable. Has not followed up with urology in > 68yr. Decided to recheck PSA today and if stable then possible recheck 1 yr. If rising then we'll get him back to see his urologist to discuss any further eval that may be necessary.  An After Visit Summary was printed and given to the patient.  FOLLOW UP: Return in about 6 months (around 09/23/2020) for annual CPE (fasting).  Signed:  Crissie Sickles, MD           03/23/2020

## 2020-03-24 ENCOUNTER — Encounter: Payer: Self-pay | Admitting: Family Medicine

## 2020-04-11 ENCOUNTER — Other Ambulatory Visit: Payer: Self-pay | Admitting: Family Medicine

## 2020-04-12 ENCOUNTER — Other Ambulatory Visit: Payer: Self-pay | Admitting: Family Medicine

## 2020-04-13 ENCOUNTER — Encounter: Payer: Self-pay | Admitting: Family Medicine

## 2020-04-23 ENCOUNTER — Telehealth: Payer: Self-pay | Admitting: Dermatology

## 2020-04-23 NOTE — Telephone Encounter (Signed)
Patient is calling for referral appointment from Dr. Anitra Lauth, but does not want to wait until February 2022, so wants referral sent back to Dr. Anitra Lauth.

## 2020-04-27 ENCOUNTER — Encounter: Payer: Self-pay | Admitting: Family Medicine

## 2020-04-27 DIAGNOSIS — L989 Disorder of the skin and subcutaneous tissue, unspecified: Secondary | ICD-10-CM

## 2020-04-27 NOTE — Telephone Encounter (Signed)
OK, cancel ref to Dr. Denna Haggard New order to [facility] in [place] orders.

## 2020-04-28 NOTE — Telephone Encounter (Signed)
Please cancel referral to Dr. Denna Haggard

## 2020-05-03 DIAGNOSIS — D225 Melanocytic nevi of trunk: Secondary | ICD-10-CM | POA: Diagnosis not present

## 2020-05-03 DIAGNOSIS — L989 Disorder of the skin and subcutaneous tissue, unspecified: Secondary | ICD-10-CM | POA: Diagnosis not present

## 2020-05-03 DIAGNOSIS — L814 Other melanin hyperpigmentation: Secondary | ICD-10-CM | POA: Diagnosis not present

## 2020-05-03 DIAGNOSIS — L720 Epidermal cyst: Secondary | ICD-10-CM | POA: Diagnosis not present

## 2020-05-03 DIAGNOSIS — L821 Other seborrheic keratosis: Secondary | ICD-10-CM | POA: Diagnosis not present

## 2020-05-03 DIAGNOSIS — D485 Neoplasm of uncertain behavior of skin: Secondary | ICD-10-CM | POA: Diagnosis not present

## 2020-05-07 ENCOUNTER — Encounter: Payer: Self-pay | Admitting: Podiatry

## 2020-05-07 ENCOUNTER — Other Ambulatory Visit: Payer: Self-pay

## 2020-05-07 ENCOUNTER — Ambulatory Visit: Payer: Medicare Other | Admitting: Podiatry

## 2020-05-07 DIAGNOSIS — B351 Tinea unguium: Secondary | ICD-10-CM | POA: Diagnosis not present

## 2020-05-07 DIAGNOSIS — M79675 Pain in left toe(s): Secondary | ICD-10-CM | POA: Diagnosis not present

## 2020-05-07 DIAGNOSIS — M79674 Pain in right toe(s): Secondary | ICD-10-CM | POA: Diagnosis not present

## 2020-05-09 NOTE — Progress Notes (Signed)
Subjective: Jordan Mccullough is a 79 y.o. male patient seen today painful mycotic nails b/l that are difficult to trim. Pain interferes with ambulation. Aggravating factors include wearing enclosed shoe gear. Pain is relieved with periodic professional debridement.   Patient states he has been applying the compunded antifungal toenail solution as instructed.   Patient Active Problem List   Diagnosis Date Noted  . Dysphonia 06/05/2019  . 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 Back pain 04/19/2016  . Seizure disorder ([place]) 04/03/2016  . DNR (do not resuscitate)   . Palliative care encounter   . Complaints of leg weakness 11/08/2015  . 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 seizures, not intractable, without status epilepticus ([place]) 10/11/2015  . Syncope 08/05/2015  . Seizure-like activity ([place]) 08/04/2015  . Brain metastases ([place]) 08/04/2015  . Seizure ([place]) 08/04/2015  . AAA (abdominal aortic aneurysm) without rupture ([place]) 06/21/2015  . Aftercare following surgery of the circulatory system 06/21/2015  . Essential hypertension 06/23/2014  . Primary cancer of left upper lobe of lung ([place]) 06/19/2013  . History of AAA (abdominal aortic aneurysm) repair 10/14/2012  . Aneurysm of common iliac artery ([place]) 10/14/2012  . Elevated PSA 07/10/2012  . CAD (coronary artery disease) 07/31/2011  . DIVERTICULOSIS, COLON 07/05/2010  . COLONIC POLYPS 02/24/2010  . AMBLYOPIA, HX OF 02/24/2010  . HYPERGLYCEMIA, FASTING 05/15/2008  . Hyperlipidemia 05/23/2007  . MYOCARDIAL INFARCTION, HX OF 05/23/2007    Current Outpatient Medications on File Prior to Visit  Medication Sig Dispense Refill  . amLODipine (NORVASC) 5 MG tablet Take 1 tablet (5 mg total) by mouth daily. 90 tablet 1  . atorvastatin (LIPITOR) 20 MG tablet TAKE 1 TABLET BY MOUTH EVERY DAY 90 tablet 1  . lamoTRIgine (LAMICTAL) 200 MG tablet TAKE 1 TABLET BY MOUTH IN THE MORNING AND 1&1/2  TABLETS IN THE EVENING 225 tablet 3  . metoprolol tartrate (LOPRESSOR) 50 MG tablet TAKE 1 TABLET BY MOUTH TWICE A DAY 180 tablet 1  . metroNIDAZOLE (METROGEL) 0.75 % gel Apply 1 application topically 2 (two) times daily. (Patient not taking: Reported on 03/23/2020) 45 g 1  . Multiple Vitamin (MULTIVITAMIN WITH MINERALS) TABS tablet Take 1 tablet by mouth daily.    . NONFORMULARY OR COMPOUNDED ITEM Antifungal solution: Terbinafine 3%, Fluconazole 2%, Tea Tree Oil 5%, Urea 10%, Ibuprofen 2% in DMSO suspension #68mL (Patient not taking: Reported on 03/23/2020) 1 each 3  . SYMBICORT 160-4.5 MCG/ACT inhaler INHALE 1-2 PUFFS INTO THE LUNGS EVERY 12 HOURS. GARGLE AND SPIT AFTER USE. 1 each 1  . tamsulosin (FLOMAX) 0.4 MG CAPS capsule Take 0.4 mg by mouth daily.     No current facility-administered medications on file prior to visit.    Allergies  Allergen Reactions  . Quinolones Other (See Comments)    Pt with aneurism: need to avoid quinoline use b/c of weakening of vascular wall that can be associated with quinolones.  . Wellbutrin [Bupropion] Other (See Comments)    AVOID IN PT W/HX OF SEIZURES  . Rosuvastatin Other (See Comments)    Myalgias and dark urine  . Iodinated Diagnostic Agents Hives    1 hive on lt cheek lasting approximately 1 hour on last 2 CT per pt; needs pre meds in future; 50 mg benadryl po 1 hr prior to exam per Dr. Weber Cooks 07/2014  11/2016 per Tery Sanfilippo, pt needs 13hr premeds per our policy.    Objective: Physical Exam  General: Patient is a pleasant 79 y.o.  Caucasian male in no acute distress, Awake, alert and oriented x 3  Neurovascular status unchanged b/l.  Capillary refill time to digits immediate b/l. Palpable DP pulses b/l. Palpable PT pulses b/l. Pedal hair present b/l. Skin temperature gradient within normal limits b/l. No pain with calf compression b/l. No edema noted b/l.   Protective sensation intact 5/5 intact bilaterally with 10g monofilament b/l. Vibratory  sensation intact b/l. Proprioception intact bilaterally.  Dermatological:  Pedal skin with normal turgor, texture and tone bilaterally. No open wounds bilaterally. No interdigital macerations bilaterally. Toenails 1-5 b/l softened, elongated, discolored, dystrophic, thickened,  with subungual debris and tenderness to dorsal palpation.  Musculoskeletal:  Normal muscle strength 5/5 to all lower extremity muscle groups bilaterally. No pain crepitus or joint limitation noted with ROM b/l. No gross bony deformities bilaterally.  Assessment and Plan:  1. Pain due to onychomycosis of toenails of both feet    -Examined patient. -No new findings. No new orders. -Toenails 1-5 b/l were debrided in length and girth with sterile nail nippers and dremel without iatrogenic bleeding.  -Continue to use compounded topical antifungal solution from [place] daily on toenails. -Patient to report any pedal injuries to medical professional immediately. -Patient to continue soft, supportive shoe gear daily. -Patient/POA to call should there be question/concern in the interim.  Return in about 3 months (around 08/06/2020).  Marzetta Board, DPM

## 2020-05-28 ENCOUNTER — Other Ambulatory Visit: Payer: Self-pay | Admitting: *Deleted

## 2020-05-28 DIAGNOSIS — C7931 Secondary malignant neoplasm of brain: Secondary | ICD-10-CM

## 2020-06-08 ENCOUNTER — Other Ambulatory Visit: Payer: Self-pay | Admitting: Family Medicine

## 2020-06-30 ENCOUNTER — Encounter: Payer: Self-pay | Admitting: Radiation Oncology

## 2020-06-30 ENCOUNTER — Other Ambulatory Visit: Payer: Self-pay

## 2020-07-02 ENCOUNTER — Ambulatory Visit
Admission: RE | Admit: 2020-07-02 | Discharge: 2020-07-02 | Disposition: A | Discharge status: Home / Self Care | Payer: Medicare Other | Source: Ambulatory Visit | Attending: Radiation Oncology | Admitting: Radiation Oncology

## 2020-07-02 DIAGNOSIS — G930 Cerebral cysts: Secondary | ICD-10-CM | POA: Diagnosis not present

## 2020-07-02 DIAGNOSIS — C7931 Secondary malignant neoplasm of brain: Secondary | ICD-10-CM

## 2020-07-02 DIAGNOSIS — G936 Cerebral edema: Secondary | ICD-10-CM | POA: Diagnosis not present

## 2020-07-02 DIAGNOSIS — Z85118 Personal history of other malignant neoplasm of bronchus and lung: Secondary | ICD-10-CM | POA: Diagnosis not present

## 2020-07-02 DIAGNOSIS — G9389 Other specified disorders of brain: Secondary | ICD-10-CM | POA: Diagnosis not present

## 2020-07-02 MED ORDER — GADOBENATE DIMEGLUMINE 529 MG/ML IV SOLN
15.0000 mL | Freq: Once | INTRAVENOUS | Status: AC | PRN
  Administered 2020-07-02: 15 mL via INTRAVENOUS

## 2020-07-05 ENCOUNTER — Ambulatory Visit
Admission: RE | Admit: 2020-07-05 | Discharge: 2020-07-05 | Disposition: A | Discharge status: Home / Self Care | Payer: Medicare Other | Source: Ambulatory Visit | Attending: Radiation Oncology | Admitting: Radiation Oncology

## 2020-07-05 DIAGNOSIS — F17210 Nicotine dependence, cigarettes, uncomplicated: Secondary | ICD-10-CM | POA: Diagnosis not present

## 2020-07-05 DIAGNOSIS — C7931 Secondary malignant neoplasm of brain: Secondary | ICD-10-CM | POA: Diagnosis not present

## 2020-07-05 DIAGNOSIS — R569 Unspecified convulsions: Secondary | ICD-10-CM | POA: Diagnosis not present

## 2020-07-05 DIAGNOSIS — C3412 Malignant neoplasm of upper lobe, left bronchus or lung: Secondary | ICD-10-CM

## 2020-07-05 DIAGNOSIS — Z85118 Personal history of other malignant neoplasm of bronchus and lung: Secondary | ICD-10-CM | POA: Diagnosis not present

## 2020-07-05 DIAGNOSIS — Z08 Encounter for follow-up examination after completed treatment for malignant neoplasm: Secondary | ICD-10-CM | POA: Diagnosis not present

## 2020-07-05 NOTE — Progress Notes (Signed)
Radiation Oncology         [phone]  Follow Up Evaluation - Conducted via telephone due to current COVID-19 concerns for limiting patient exposure  I spoke with the patient to conduct this visit via telephone to spare the patient unnecessary potential exposure in the healthcare setting during the current COVID-19 pandemic. The patient was notified in advance and was offered a [place] meeting to allow for face to face communication but unfortunately reported that they did not have the appropriate resources/technology to support such a visit and instead preferred to proceed with a telephone conversation.  ________________________________  Name: Jordan Mccullough MRN: [id number]  Date: 07/05/2020  DOB: 1941-07-25  Follow-Up Visit Note  CC: Tammi Sou, MD    Diagnosis:   Metastatic Lung Cancer  Interval Since Radiotherapy: 4 years, 10 months  08/25/15 SRS Treatment:    1.  PTV1 Rt Vertex Frontoparietal 6mm target was treated using 4 Arcs to a prescription dose of 18 Gy. ExacTrac Snap verification was performed for each couch angle.  2.  PTV2  Rt Posterior Frontal Lobe 31mm target was treated using 4 Arcs to a prescription dose of 20 Gy. ExacTrac Snap verification was performed for each couch angle.  Narrative:   Jordan Mccullough is a very pleasant 79 y.o. gentleman with a history of metastatic non-small cell lung cancer who received SRS to 2 lesions in the frontoparietal region which he completed in January of 2017. He has had stability in the brain since his treatment. He was treated with trental/vitamine E for radionecrosis which has resolved. He continues to be NED as well on CT restaging scans and follows in observation with Dr. Julien Nordmann though he is overdue for this. He also follows with Dr. Delice Lesch for his history of seizures and will be due to see her in February 2022. He had an MRI of the brain on 07/02/2020 that showed stability in the 2 treated lesions in the right frontal and frontal  parietal region that were stable without interval growth or any new disease.  He is contacted today by phone to discuss these findings.  On review of systems, the patient reports that he is doing well overall. He reports that his wife just had surgery today for a carotid endarterectomy.  He is happy that she is doing well postoperatively and headed to the hospital again to see her.  He reports that he himself is doing quite well and denies any chest pain or shortness of breath.  He is not having any new pain, and denies any concerns with headaches, visual changes, seizure activity, or difficulty with balance or gait. No other complaints are verbalized.  Past Medical History:  Past Medical History:  Diagnosis Date  . AAA (abdominal aortic aneurysm) (HCC)    s/p repair (aortoiliac bipass; with persistent endograft leak in inferior mesenteric artery)--stable as of 01/2017 vascular f/u.  Marland Kitchen Asymptomatic cholelithiasis 07/2015   Incidental finding on PET CT  . Back pain 04/19/2016  . BPH with elevated PSA    PSA signif rise 11/2017 (5.4 in 2015 to 31.21 November 2017. Bx 03/2018 benign.  . Coronary artery disease    MI 1992, S/P  PTCA; negative stress test in November 2011 with no ischemia.   . Diverticulosis   . Elevated PSA 01/2018   01/22/18:  PSA 26.5.  Prostate MRI with abnormality->subsequent prostate bx BENIGN 04/12/18.PSA 72019 stbl at 24.1->rose to 36 03/2020->rec'd return to urol.  [name] hematuria 05/2018   Occurred  2+ mo's after prostate bx: attributed to bx by urol, treated empirically with keflex.  Urol initially suspected gross hem was sequela of recent prost bx. BUT recurrence of gross hem prompted full hematuria w/u 11/15/18.  Marland Kitchen Hearing loss    Bilateral   . History of radiation therapy 11/10/13-12/12/13   lung,50Gy/37fx  . Hyperlipidemia    Crestor= myalgias  . Hypertension   . Lung cancer ([place]) 05/2013   non-small cell;  L upper lobectomy with mediastinal LN dissection, chemo, and  radiation in 2014/2015. Remission until pt had questionable seizure 07/2015--MRI showed brain mets; palliative brain rad (stereotactic radiation therapy) started 08/25/15.  CT C/A/P clear 02/2016, 05/2016, 11/2016, 12/2018. MRI brain 12/2018 stable lesions/no new mets. Rpt 6 mo per onc.  . Malignant neoplasm of upper lobe, left bronchus or lung ([place]) 10/29/2013   *?New spiculated 4.5cm mass in L upper lung field on CXR at [facility] in [place] 07/14/17.  Dr. Julien Nordmann (onc) recommended f/u CXR after abx course.  If mass unchanged then repeat CT chest.  Surveillance CT chest and MRI brain 06/2018 show no sign of dz.  . Myocardial infarction [facility]) 1992   Dr Angelena Form    . Peripheral vascular disease ([place]) 08/2012   4.8x4.6 cm infrarenal abdominal aortic fusiform aneurysm, 1.5 cm right common iliac artery aneurysm.  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  Marland Kitchen Pneumonia 09/06/2018   XRAY was done at [facility].  . S/P radiation therapy [facility] 08/25/15   Stereotactic radiation therapy: frontoparietal 18gy,posterior frontal lobe 20gy,  . Seizure disorder ([place]) 2016/2017   as sequela of brain mets;  Grand mal seizure 07/2015, then got on keppra and was seizure-free until focal motor seizures of L side of face began 02/2016- these responded well to up-titration of keppra but pt had adverse side effects so eventually pt had to be switched over to lamictal 07/2016--stable/seizure free on this med as of 09/2018 neuro f/u.  Marland Kitchen Shortness of breath   . Tobacco dependence    "Quit" 1992, but pt has smoked "on and off" since that time    Past Surgical History: Past Surgical History:  Procedure Laterality Date  . ABDOMINAL AORTIC ENDOVASCULAR STENT GRAFT N/A 05/07/2014   Procedure: ABDOMINAL AORTIC ENDOVASCULAR STENT GRAFT;  Surgeon: Serafina Mitchell, MD;  Location: [place] OR;  Service: Vascular;  Laterality:  N/A;  . Carotid duplex dopplers  07/2015   1-39% on R, no signif dz noted on L  . COLONOSCOPY W/ POLYPECTOMY  2003    negative 2010,due 2020; Dr Olevia Perches  . CORONARY ANGIOPLASTY     no stents  . CYSTOSCOPY/RETROGRADE/URETEROSCOPY Bilateral 10/09/2012   Procedure: BILATERAL RETROGRADE bladder and urethral BIOPSY ;  Surgeon: Molli Hazard, MD;  Location: [facility];  Service: Urology;  Laterality: Bilateral;  BILATERAL RETROGRADE   . EEG  08/05/15   Pt placed on keppra just prior to this test due to having ? seizure (MRI showed brain mets)  . HERNIA REPAIR Bilateral    Inguinal  . INGUINAL HERIIORRHAPHY BILATERALLY    . IR ANGIOGRAM SELECTIVE EACH ADDITIONAL VESSEL  12/03/2017  . IR ANGIOGRAM VISCERAL SELECTIVE  12/03/2017  . IR ANGIOGRAM VISCERAL SELECTIVE  12/03/2017  . IR EMBO ARTERIAL NOT HEMORR HEMANG INC GUIDE ROADMAPPING  12/03/2017  . IR GENERIC HISTORICAL  07/20/2016   IR RADIOLOGIST EVAL & MGMT 07/20/2016 [facility] INTERV RAD  .  IR GENERIC HISTORICAL  08/02/2016   IR RADIOLOGIST EVAL & MGMT 08/02/2016 Sandi Mariscal, MD [facility] INTERV RAD  . IR RADIOLOGIST EVAL & MGMT  11/08/2017  . IR RADIOLOGIST EVAL & MGMT  01/01/2018  . IR RADIOLOGIST EVAL & MGMT  08/27/2019  . IR US GUIDE VASC ACCESS RIGHT  12/03/2017  . MEDIASTINOSCOPY N/A 05/01/2013   Procedure: MEDIASTINOSCOPY;  Surgeon: Gaye Pollack, MD;  Location: [facility] OR;  Service: Thoracic;  Laterality: N/A;  . PFTs  04/2013   Minimal obstructive airway disease  . PILONIDAL CYST EXCISION    . PROSTATE BIOPSY N/A 10/09/2012   NEG bx 04/12/18 as well.  Procedure: PROSTATIC URETHRAL BIOPSY--BPH--no evidence of malignancy;  Surgeon: Molli Hazard, MD;  Location: [facility];  Service: Urology;  Laterality: N/A;  PROSTATIC URETHRAL BIOPSY  . PROSTATE BIOPSY  03/2018   NEG  . PTCA  1992  . ROTATOR CUFF REPAIR     Bilateral  . THOROCOTOMY WITH LOBECTOMY Left 05/29/2013   Procedure: LEFT THOROCOTOMY WITH LEFT UPPER LOBE LOBECTOMY;  Surgeon: Gaye Pollack, MD;  Location: [facility] OR;  Service: Thoracic;  Laterality: Left;  . TRANSTHORACIC ECHOCARDIOGRAM  07/2015   EF 50-55%, hypokin of inf myoc, grd I DD, mild MR, mod dilat of LA.  Marland Kitchen VIDEO BRONCHOSCOPY N/A 05/01/2013   Procedure: VIDEO BRONCHOSCOPY;  Surgeon: Gaye Pollack, MD;  Location: [facility] OR;  Service: Thoracic;  Laterality: N/A;    Social History:  Social History   Socioeconomic History  . Marital status: Married    Spouse name: Not on file  . Number of children: Not on file  . Years of education: Not on file  . Highest education level: Not on file  Occupational History  . Occupation: Retired Estate manager/land agent  . Smoking status: Current Every Day Smoker    Packs/day: 1.00    Years: 24.00    Pack years: 24.00    Types: Cigarettes    Last attempt to quit: 05/28/2013    Years since quitting: 7.1  . Smokeless tobacco: Never Used  . Tobacco comment: QUIT 15 YEARS AGO-07/03/17 smoking  Vaping Use  . Vaping Use: Never used  Substance and Sexual Activity  . Alcohol use: Yes    Alcohol/week: 15.0 standard drinks    Types: 3 Glasses of wine, 12 Cans of beer per week    Comment: 1-2 beers a day  . Drug use: No  . Sexual activity: Not on file  Other Topics Concern  . Not on file  Social History Narrative   HEART HEALTHY DIET.     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   MARRIED, 2 children who live in [place].  Smoked on and off since then.   ALCOHOL USE -YES- RED WINE SOCIALLY, couple of beers at night usually.   07-08-18 Unable to ask abuse questions wife with him today.   Social Determinants of Health   Financial Resource Strain:   . Difficulty of Paying Living Expenses: Not on file  Food Insecurity:   . Worried About Charity fundraiser in the Last Year: Not on file  . Ran Out of Food in the Last Year: Not on file  Transportation Needs:   . Lack of Transportation (Medical): Not on file  . Lack of Transportation (Non-Medical): Not on file   Physical Activity:   . Days of Exercise per Week: Not on file  . Minutes of Exercise per Session: Not  on file  Stress:   . Feeling of Stress : Not on file  Social Connections:   . Frequency of Communication with Friends and Family: Not on file  . Frequency of Social Gatherings with Friends and Family: Not on file  . Attends Religious Services: Not on file  . Active Member of Clubs or Organizations: Not on file  . Attends Archivist Meetings: Not on file  . Marital Status: Not on file  Intimate Partner Violence:   . Fear of Current or Ex-Partner: Not on file  . Emotionally Abused: Not on file  . Physically Abused: Not on file  . Sexually Abused: Not on file  The patient is from [place] originally. He enjoys flying planes but has not flown since his seizures. He does still do mechanical work on them.  He and his wife Gwenette Greet live in [place].  Family History: Family History  Problem Relation Age of Onset  . Hypertension Mother   . Prostate cancer Father        Prostate cancer  . Cancer Father        Brain Tumor  . Lung cancer Sister        NON SMOKER  . Cancer Sister   . Hyperlipidemia Sister   . Hyperlipidemia Brother   . Heart attack Brother   . Hypertension Brother   . Prostate cancer Brother   . Diabetes Neg Hx   . Stroke Neg Hx     ALLERGIES:  is allergic to quinolones, wellbutrin [bupropion], rosuvastatin, and iodinated diagnostic agents.  Meds: Current Outpatient Medications  Medication Sig Dispense Refill  . amLODipine (NORVASC) 5 MG tablet Take 1 tablet (5 mg total) by mouth daily. 90 tablet 1  . atorvastatin (LIPITOR) 20 MG tablet TAKE 1 TABLET BY MOUTH EVERY DAY 90 tablet 1  . budesonide-formoterol (SYMBICORT) 160-4.5 MCG/ACT inhaler INHALE 1-2 PUFFS INTO THE LUNGS EVERY 12 HOURS. GARGLE AND SPIT AFTER USE. 10.2 each 1  . lamoTRIgine (LAMICTAL) 200 MG tablet TAKE 1 TABLET BY MOUTH IN THE MORNING AND 1&1/2 TABLETS IN THE EVENING 225 tablet 3  .  metoprolol tartrate (LOPRESSOR) 50 MG tablet TAKE 1 TABLET BY MOUTH TWICE A DAY 180 tablet 1  . Multiple Vitamin (MULTIVITAMIN WITH MINERALS) TABS tablet Take 1 tablet by mouth daily.    . tamsulosin (FLOMAX) 0.4 MG CAPS capsule Take 0.4 mg by mouth daily.    . metroNIDAZOLE (METROGEL) 0.75 % gel Apply 1 application topically 2 (two) times daily. (Patient not taking: Reported on 03/23/2020) 45 g 1  . NONFORMULARY OR COMPOUNDED ITEM Antifungal solution: Terbinafine 3%, Fluconazole 2%, Tea Tree Oil 5%, Urea 10%, Ibuprofen 2% in DMSO suspension #53mL (Patient not taking: Reported on 03/23/2020) 1 each 3   No current facility-administered medications for this encounter.    Physical Findings: Unable to assess due to encounter type.  Lab Findings: Lab Results  Component Value Date   WBC 8.2 07/10/2019   HGB 15.9 07/10/2019   HCT 47.4 07/10/2019   MCV 87.9 07/10/2019   PLT 253 07/10/2019     Radiographic Findings: MR Brain W Wo Contrast  Result Date: 07/02/2020 CLINICAL DATA:  Lung cancer.  Follow-up treated metastatic disease. EXAM: MRI HEAD WITHOUT AND WITH CONTRAST TECHNIQUE: Multiplanar, multiecho pulse sequences of the brain and surrounding structures were obtained without and with intravenous contrast. CONTRAST:  89mL MULTIHANCE GADOBENATE DIMEGLUMINE 529 MG/ML IV SOLN COMPARISON:  MRI head 01/01/2020 FINDINGS: Brain: Treated lesion in the right posterior frontal  cortex is stable, measuring 12 x 18 mm. Cystic lesion with peripheral enhancement and adjacent edema stable. Mild chronic blood products also stable. Treated lesion in the right precentral gyrus over the convexity is stable. There is associated hemorrhage and mild enhancement which is stable. No new metastatic deposits identified. Chronic microhemorrhage in the occipital lobes bilaterally. Negative for acute infarct. Vascular: Negative for hyperdense vessel Skull and upper cervical spine: No focal skeletal lesion. Sinuses/Orbits:  Paranasal sinuses clear. Bilateral cataract extraction Other: None IMPRESSION: Two treated lesions in the right frontal parietal lobe stable without interval growth. No new lesions. Electronically Signed   By: Franchot Gallo M.D.   On: 07/02/2020 10:50    Impression/Plan: 1. Recurrent Metastatic Stage IIIA, T2a, N2, M0, NSCLC, adenocarcinoma of the left upper lobe.  The patient continues to do very well clinically.  We discussed the rationale for continued follow-up with imaging of the brain in 6 months with MRI.  The patient is overdue for evaluation with Dr. Julien Nordmann and I will send his nurse a note to get him back into clinic for evaluation.   2. Seizure at presentation.  He continues under the care of Dr. Delice Lesch, we will follow this expectantly and he continues antiseizure medication under her guidance.  His medication is currently Lamictal.  3. Elevated PSA with BPH.  The patient continues to follow-up with Dr. Lovena Neighbours.  This will be followed expectantly, but does need further work-up.  He did have a negative biopsy a few years ago.    Given current concerns for patient exposure during the COVID-19 pandemic, this encounter was conducted via telephone.  The patient has provided two factor identification and has given verbal consent for this type of encounter and has been advised to only accept a meeting of this type in a secure network environment. The time spent during this encounter was 25 minutes including preparation, discussion, and coordination of the patient's care. The attendants for this meeting include  Hayden Pedro  and Jill Alexanders.  During the encounter, Hayden Pedro was located at [facility] Radiation Oncology Department.  Jordan Mccullough was located at home.       Carola Rhine, PAC

## 2020-07-30 ENCOUNTER — Other Ambulatory Visit: Payer: Self-pay | Admitting: Family Medicine

## 2020-08-08 ENCOUNTER — Other Ambulatory Visit: Payer: Self-pay | Admitting: Family Medicine

## 2020-08-08 ENCOUNTER — Other Ambulatory Visit: Payer: Self-pay | Admitting: Neurology

## 2020-08-10 ENCOUNTER — Other Ambulatory Visit: Payer: Self-pay

## 2020-08-10 ENCOUNTER — Ambulatory Visit: Payer: Medicare Other | Admitting: Podiatry

## 2020-08-10 ENCOUNTER — Encounter: Payer: Self-pay | Admitting: Podiatry

## 2020-08-10 DIAGNOSIS — M79675 Pain in left toe(s): Secondary | ICD-10-CM

## 2020-08-10 DIAGNOSIS — M79674 Pain in right toe(s): Secondary | ICD-10-CM

## 2020-08-10 DIAGNOSIS — B351 Tinea unguium: Secondary | ICD-10-CM | POA: Diagnosis not present

## 2020-08-15 NOTE — Progress Notes (Signed)
Subjective: Jordan Mccullough is a 79 y.o. male patient seen today painful mycotic nails b/l that are difficult to trim. Pain interferes with ambulation. Aggravating factors include wearing enclosed shoe gear. Pain is relieved with periodic professional debridement.   Patient voices no new pedal concerns on today's visit.   PCP is Dr. Shawnie Dapper and last visit was 03/23/2020.  Patient Active Problem List   Diagnosis Date Noted  . Dysphonia 06/05/2019  . 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 Back pain 04/19/2016  . Seizure disorder ([place]) 04/03/2016  . DNR (do not resuscitate)   . Palliative care encounter   . Complaints of leg weakness 11/08/2015  . 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 Syncope 08/05/2015  . Seizure-like activity ([place]) 08/04/2015  . Brain metastases ([place]) 08/04/2015  . Seizure ([place]) 08/04/2015  . AAA (abdominal aortic aneurysm) without rupture ([place]) 06/21/2015  . Aftercare following surgery of the circulatory system 06/21/2015  . Essential hypertension 06/23/2014  . Primary cancer of left upper lobe of lung ([place]) 06/19/2013  . History of AAA (abdominal aortic aneurysm) repair 10/14/2012  . Aneurysm of common iliac artery ([place]) 10/14/2012  . Elevated PSA 07/10/2012  . CAD (coronary artery disease) 07/31/2011  . DIVERTICULOSIS, COLON 07/05/2010  . COLONIC POLYPS 02/24/2010  . AMBLYOPIA, HX OF 02/24/2010  . HYPERGLYCEMIA, FASTING 05/15/2008  . Hyperlipidemia 05/23/2007  . MYOCARDIAL INFARCTION, HX OF 05/23/2007    Current Outpatient Medications on File Prior to Visit  Medication Sig Dispense Refill  . amLODipine (NORVASC) 5 MG tablet TAKE 1 TABLET BY MOUTH EVERY DAY 90 tablet 1  . atorvastatin (LIPITOR) 20 MG tablet TAKE 1 TABLET BY MOUTH EVERY DAY 90 tablet 1  . lamoTRIgine (LAMICTAL) 200 MG tablet TAKE 1 TABLET BY MOUTH IN THE MORNING  AND 1&1/2 TABLETS IN THE EVENING 225 tablet 1  . metoprolol tartrate (LOPRESSOR) 50 MG tablet TAKE 1 TABLET BY MOUTH TWICE A DAY 180 tablet 1  . metroNIDAZOLE (METROGEL) 0.75 % gel Apply 1 application topically 2 (two) times daily. 45 g 1  . Multiple Vitamin (MULTIVITAMIN WITH MINERALS) TABS tablet Take 1 tablet by mouth daily.    . NONFORMULARY OR COMPOUNDED ITEM Antifungal solution: Terbinafine 3%, Fluconazole 2%, Tea Tree Oil 5%, Urea 10%, Ibuprofen 2% in DMSO suspension #58mL 1 each 3  . SYMBICORT 160-4.5 MCG/ACT inhaler INHALE 1-2 PUFFS INTO THE LUNGS EVERY 12 HOURS. GARGLE AND SPIT AFTER USE. 10.2 each 0  . tamsulosin (FLOMAX) 0.4 MG CAPS capsule Take 0.4 mg by mouth daily.     No current facility-administered medications on file prior to visit.    Allergies  Allergen Reactions  . Quinolones Other (See Comments)    Pt with aneurism: need to avoid quinoline use b/c of weakening of vascular wall that can be associated with quinolones.  . Wellbutrin [Bupropion] Other (See Comments)    AVOID IN PT W/HX OF SEIZURES  . Rosuvastatin Other (See Comments)    Myalgias and dark urine  . Iodinated Diagnostic Agents Hives    1 hive on lt cheek lasting approximately 1 hour on last 2 CT per pt; needs pre meds in future; 50 mg benadryl po 1 hr prior to exam per Dr. Weber Cooks 07/2014  11/2016 per Tery Sanfilippo, pt needs 13hr premeds per our policy.    Objective: Physical Exam  General: Patient is a pleasant 79 y.o. Caucasian male in no acute distress, Awake,  alert and oriented x 3  Neurovascular status unchanged b/l.  Capillary refill time to digits immediate b/l. Palpable DP pulses b/l. Palpable PT pulses b/l. Pedal hair present b/l. Skin temperature gradient within normal limits b/l. No pain with calf compression b/l. No edema noted b/l.   Protective sensation intact 5/5 intact bilaterally with 10g monofilament b/l. Vibratory sensation intact b/l. Proprioception intact bilaterally.  Dermatological:   Pedal skin with normal turgor, texture and tone bilaterally. No open wounds bilaterally. No interdigital macerations bilaterally. Toenails 1-5 b/l softened, elongated, discolored, dystrophic, thickened,  with subungual debris and tenderness to dorsal palpation.  Musculoskeletal:  Normal muscle strength 5/5 to all lower extremity muscle groups bilaterally. No pain crepitus or joint limitation noted with ROM b/l. No gross bony deformities bilaterally.  Assessment and Plan:  1. Pain due to onychomycosis of toenails of both feet    -Examined patient. -No new findings. No new orders. -Toenails 1-5 b/l were debrided in length and girth with sterile nail nippers and dremel without iatrogenic bleeding.  -Continue to use compounded topical antifungal solution from [place] daily on toenails. -Patient to report any pedal injuries to medical professional immediately. -Patient to continue soft, supportive shoe gear daily. -Patient/POA to call should there be question/concern in the interim.  Return in about 3 months (around 11/08/2020) for painful mycotic toenails.  Marzetta Board, DPM

## 2020-08-26 ENCOUNTER — Other Ambulatory Visit: Payer: Self-pay

## 2020-08-26 ENCOUNTER — Other Ambulatory Visit: Payer: Self-pay | Admitting: Interventional Radiology

## 2020-08-26 DIAGNOSIS — I9789 Other postprocedural complications and disorders of the circulatory system, not elsewhere classified: Secondary | ICD-10-CM

## 2020-09-01 ENCOUNTER — Encounter: Payer: Self-pay | Admitting: Family Medicine

## 2020-09-01 MED ORDER — BUDESONIDE-FORMOTEROL FUMARATE 160-4.5 MCG/ACT IN AERO: INHALATION_SPRAY | RESPIRATORY_TRACT | 11 refills | Status: DC

## 2020-09-01 NOTE — Telephone Encounter (Signed)
Looks like brand name (symbicort) is what insurance prefers. I just sent in rx and specified on it that it needs to be brand name symbicort.

## 2020-09-09 ENCOUNTER — Telehealth: Payer: Self-pay

## 2020-09-09 ENCOUNTER — Other Ambulatory Visit: Payer: Self-pay

## 2020-09-09 MED ORDER — DIPHENHYDRAMINE HCL 50 MG PO CAPS: 50.0000 mg | ORAL_CAPSULE | Freq: Once | ORAL | Status: DC

## 2020-09-09 MED ORDER — PREDNISONE 50 MG PO TABS: ORAL_TABLET | ORAL | 0 refills | Status: DC

## 2020-09-09 NOTE — Telephone Encounter (Signed)
13 hour prep

## 2020-09-10 ENCOUNTER — Other Ambulatory Visit: Payer: Self-pay

## 2020-09-10 ENCOUNTER — Encounter (HOSPITAL_COMMUNITY): Payer: Self-pay

## 2020-09-10 ENCOUNTER — Ambulatory Visit (HOSPITAL_COMMUNITY)
Admission: RE | Admit: 2020-09-10 | Discharge: 2020-09-10 | Disposition: A | Discharge status: Home / Self Care | Payer: Medicare Other | Source: Ambulatory Visit | Attending: Interventional Radiology | Admitting: Interventional Radiology

## 2020-09-10 DIAGNOSIS — N281 Cyst of kidney, acquired: Secondary | ICD-10-CM | POA: Diagnosis not present

## 2020-09-10 DIAGNOSIS — K5730 Diverticulosis of large intestine without perforation or abscess without bleeding: Secondary | ICD-10-CM | POA: Diagnosis not present

## 2020-09-10 DIAGNOSIS — K8020 Calculus of gallbladder without cholecystitis without obstruction: Secondary | ICD-10-CM | POA: Diagnosis not present

## 2020-09-10 DIAGNOSIS — I9789 Other postprocedural complications and disorders of the circulatory system, not elsewhere classified: Secondary | ICD-10-CM | POA: Diagnosis not present

## 2020-09-10 DIAGNOSIS — K8080 Other cholelithiasis without obstruction: Secondary | ICD-10-CM | POA: Diagnosis not present

## 2020-09-10 LAB — POCT I-STAT CREATININE: Creatinine, Ser: 0.9 mg/dL (ref 0.61–1.24)

## 2020-09-10 MED ORDER — IOHEXOL 350 MG/ML SOLN
100.0000 mL | Freq: Once | INTRAVENOUS | Status: AC | PRN
  Administered 2020-09-10: 100 mL via INTRAVENOUS

## 2020-09-16 ENCOUNTER — Encounter: Payer: Self-pay | Admitting: *Deleted

## 2020-09-16 ENCOUNTER — Other Ambulatory Visit: Payer: Self-pay

## 2020-09-16 ENCOUNTER — Ambulatory Visit
Admission: RE | Admit: 2020-09-16 | Discharge: 2020-09-16 | Disposition: A | Discharge status: Home / Self Care | Payer: Medicare Other | Source: Ambulatory Visit | Attending: Interventional Radiology | Admitting: Interventional Radiology

## 2020-09-16 DIAGNOSIS — I9789 Other postprocedural complications and disorders of the circulatory system, not elsewhere classified: Secondary | ICD-10-CM

## 2020-09-16 DIAGNOSIS — T82330D Leakage of aortic (bifurcation) graft (replacement), subsequent encounter: Secondary | ICD-10-CM | POA: Diagnosis not present

## 2020-09-16 DIAGNOSIS — Z85118 Personal history of other malignant neoplasm of bronchus and lung: Secondary | ICD-10-CM | POA: Diagnosis not present

## 2020-09-16 HISTORY — PX: IR RADIOLOGIST EVAL & MGMT: IMG5224

## 2020-09-16 NOTE — Progress Notes (Signed)
Patient ID: Jordan Mccullough, male   DOB: 1940/11/30, 80 y.o.   MRN: [id number]         Chief Complaint: AAA, post endoleak repair   Referring Physician(s): Brabham (Vascular Surgery) Mohammed (Oncology)  History of Present Illness:  Jordan Mccullough a 79 y.o.malewith complex past medical history significant for smoking, hypertension, hyperlipidemia, CAD and lung cancer (with brain metastasis though remains clinically stable without evidence of disease progression) who initially underwent a technically successful repair of infrarenal abdominal aortic aneurysm by Dr. Trula Slade on 06/08/2014.  The patient was initially seen in consultation on 08/02/2016 for evaluation of potential her treatment options for a endoleak, however at that time it was decided to pursue conservative management given lack of significant growth of the native sac of the abdominal aortic aneurysm.  The patient's native abdominal aortic aneurysm sac was found to enlarge on subsequent surveillance examinations and ultimately the patient underwent percutaneous coil and liquid embolic embolization of type II endoleak on 12/03/2017.  Despite multiple efforts by the interventional radiology clinic, the patient was lost to follow-up however re-surfaced during work-up for.  Patient is now seen in telemedicine consultation following acquisition of surveillance post stent CTA performed 09/10/2020.  Patient remains without complaint.  Fortunately cancer surveillance imaging performed by Dr. Julien Nordmann has been negative for evidence of disease recurrence.      Past Medical History:  Diagnosis Date  . AAA (abdominal aortic aneurysm) (HCC)    s/p repair (aortoiliac bipass; with persistent endograft leak in inferior mesenteric artery)--stable as of 01/2017 vascular f/u.  Marland Kitchen Asymptomatic cholelithiasis 07/2015   Incidental finding on PET CT  . Back pain 04/19/2016  . BPH with elevated PSA    PSA signif rise 11/2017 (5.4 in 2015 to 31.21 November 2017. Bx 03/2018 benign.  . Coronary artery disease    MI 1992, S/P  PTCA; negative stress test in November 2011 with no ischemia.   . Diverticulosis   . Elevated PSA 01/2018   01/22/18:  PSA 26.5.  Prostate MRI with abnormality->subsequent prostate bx BENIGN 04/12/18.PSA 72019 stbl at 24.1->rose to 36 03/2020->rec'd return to urol.  [name] hematuria 05/2018   Occurred 2+ mo's after prostate bx: attributed to bx by urol, treated empirically with keflex.  Urol initially suspected gross hem was sequela of recent prost bx. BUT recurrence of gross hem prompted full hematuria w/u 11/15/18.  Marland Kitchen Hearing loss    Bilateral   . History of radiation therapy 11/10/13-12/12/13   lung,50Gy/4fx  . Hyperlipidemia    Crestor= myalgias  . Hypertension   . Lung cancer ([place]) 05/2013   non-small cell;  L upper lobectomy with mediastinal LN dissection, chemo, and radiation in 2014/2015. Remission until pt had questionable seizure 07/2015--MRI showed brain mets; palliative brain rad (stereotactic radiation therapy) started 08/25/15.  CT C/A/P clear 02/2016, 05/2016, 11/2016, 12/2018. MRI brain 12/2018 stable lesions/no new mets. Rpt 6 mo per onc.  . Malignant neoplasm of upper lobe, left bronchus or lung ([place]) 10/29/2013   *?New spiculated 4.5cm mass in L upper lung field on CXR at [facility] in [place] 07/14/17.  Dr. Julien Nordmann (onc) recommended f/u CXR after abx course.  If mass unchanged then repeat CT chest.  Surveillance CT chest and MRI brain 06/2018 show no sign of dz.  . Myocardial infarction [facility]) 1992   Dr Angelena Form    . Peripheral vascular disease ([place]) 08/2012   4.8x4.6 cm infrarenal abdominal aortic fusiform aneurysm, 1.5 cm right common iliac  artery aneurysm.  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  Marland Kitchen Pneumonia 09/06/2018   XRAY was done at [facility].  . S/P radiation therapy [facility]  08/25/15   Stereotactic radiation therapy: frontoparietal 18gy,posterior frontal lobe 20gy,  . Seizure disorder ([place]) 2016/2017   as sequela of brain mets;  Grand mal seizure 07/2015, then got on keppra and was seizure-free until focal motor seizures of L side of face began 02/2016- these responded well to up-titration of keppra but pt had adverse side effects so eventually pt had to be switched over to lamictal 07/2016--stable/seizure free on this med as of 09/2018 neuro f/u.  Marland Kitchen Shortness of breath   . Tobacco dependence    "Quit" 1992, but pt has smoked "on and off" since that time    Past Surgical History:  Procedure Laterality Date  . ABDOMINAL AORTIC ENDOVASCULAR STENT GRAFT N/A 05/07/2014   Procedure: ABDOMINAL AORTIC ENDOVASCULAR STENT GRAFT;  Surgeon: Serafina Mitchell, MD;  Location: [facility];  Service: Vascular;  Laterality: N/A;  . Carotid duplex dopplers  07/2015   1-39% on R, no signif dz noted on L  . COLONOSCOPY W/ POLYPECTOMY  2003    negative 2010,due 2020; Dr Olevia Perches  . CORONARY ANGIOPLASTY     no stents  . CYSTOSCOPY/RETROGRADE/URETEROSCOPY Bilateral 10/09/2012   Procedure: BILATERAL RETROGRADE bladder and urethral BIOPSY ;  Surgeon: Molli Hazard, MD;  Location: [facility];  Service: Urology;  Laterality: Bilateral;  BILATERAL RETROGRADE   . EEG  08/05/15   Pt placed on keppra just prior to this test due to having ? seizure (MRI showed brain mets)  . HERNIA REPAIR Bilateral    Inguinal  . INGUINAL HERIIORRHAPHY BILATERALLY    . IR ANGIOGRAM SELECTIVE EACH ADDITIONAL VESSEL  12/03/2017  . IR ANGIOGRAM VISCERAL SELECTIVE  12/03/2017  . IR ANGIOGRAM VISCERAL SELECTIVE  12/03/2017  . IR EMBO ARTERIAL NOT HEMORR HEMANG INC GUIDE ROADMAPPING  12/03/2017  . IR GENERIC HISTORICAL  07/20/2016   IR RADIOLOGIST EVAL & MGMT 07/20/2016 [facility] INTERV RAD  . IR GENERIC HISTORICAL  08/02/2016   IR RADIOLOGIST EVAL & MGMT 08/02/2016 Sandi Mariscal, MD [facility] INTERV RAD  . IR RADIOLOGIST EVAL &  MGMT  11/08/2017  . IR RADIOLOGIST EVAL & MGMT  01/01/2018  . IR RADIOLOGIST EVAL & MGMT  08/27/2019  . IR US GUIDE VASC ACCESS RIGHT  12/03/2017  . MEDIASTINOSCOPY N/A 05/01/2013   Procedure: MEDIASTINOSCOPY;  Surgeon: Gaye Pollack, MD;  Location: [facility] OR;  Service: Thoracic;  Laterality: N/A;  . PFTs  04/2013   Minimal obstructive airway disease  . PILONIDAL CYST EXCISION    . PROSTATE BIOPSY N/A 10/09/2012   NEG bx 04/12/18 as well.  Procedure: PROSTATIC URETHRAL BIOPSY--BPH--no evidence of malignancy;  Surgeon: Molli Hazard, MD;  Location: [facility];  Service: Urology;  Laterality: N/A;  PROSTATIC URETHRAL BIOPSY  . PROSTATE BIOPSY  03/2018   NEG  . PTCA  1992  . ROTATOR CUFF REPAIR     Bilateral  . THOROCOTOMY WITH LOBECTOMY Left 05/29/2013   Procedure: LEFT THOROCOTOMY WITH LEFT UPPER LOBE LOBECTOMY;  Surgeon: Gaye Pollack, MD;  Location: [facility] OR;  Service: Thoracic;  Laterality: Left;  . TRANSTHORACIC ECHOCARDIOGRAM  07/2015   EF 50-55%, hypokin of inf myoc, grd I DD, mild MR, mod dilat of LA.  Marland Kitchen VIDEO BRONCHOSCOPY N/A 05/01/2013  Procedure: VIDEO BRONCHOSCOPY;  Surgeon: Gaye Pollack, MD;  Location: [facility] OR;  Service: Thoracic;  Laterality: N/A;    Allergies: Quinolones, Wellbutrin [bupropion], Rosuvastatin, and Iodinated diagnostic agents  Medications: Prior to Admission medications   Medication Sig Start Date End Date Taking? Authorizing Provider  amLODipine (NORVASC) 5 MG tablet TAKE 1 TABLET BY MOUTH EVERY DAY 08/09/20   McGowen, Adrian Blackwater, MD  atorvastatin (LIPITOR) 20 MG tablet TAKE 1 TABLET BY MOUTH EVERY DAY 08/09/20   McGowen, Adrian Blackwater, MD  budesonide-formoterol (SYMBICORT) 160-4.5 MCG/ACT inhaler INHALE 1-2 PUFFS INTO THE LUNGS EVERY 12 HOURS. GARGLE AND SPIT AFTER USE. 09/01/20   McGowen, Adrian Blackwater, MD  lamoTRIgine (LAMICTAL) 200 MG tablet TAKE 1 TABLET BY MOUTH IN THE MORNING AND 1&1/2 TABLETS IN THE EVENING 08/09/20   Cameron Sprang, MD  metoprolol tartrate (LOPRESSOR)  50 MG tablet TAKE 1 TABLET BY MOUTH TWICE A DAY 04/12/20   McGowen, Adrian Blackwater, MD  metroNIDAZOLE (METROGEL) 0.75 % gel Apply 1 application topically 2 (two) times daily. 09/29/19   McGowen, Adrian Blackwater, MD  Multiple Vitamin (MULTIVITAMIN WITH MINERALS) TABS tablet Take 1 tablet by mouth daily.    [provider]  NONFORMULARY OR COMPOUNDED ITEM Antifungal solution: Terbinafine 3%, Fluconazole 2%, Tea Tree Oil 5%, Urea 10%, Ibuprofen 2% in DMSO suspension #36mL 01/28/20   Galaway, Stephani Police, DPM  predniSONE (DELTASONE) 50 MG tablet Take 50mg  prednisone on 09/09/20 @ 12 midnight, then take 50mg  prednisone on 09/10/20 @ 6am, then take 50mg  prednisone on 09/10/20 @ 12 noon AND take 50mg  Benadryl on 09/10/20 @ 12 noon. 09/09/20   Sandi Mariscal, MD  tamsulosin (FLOMAX) 0.4 MG CAPS capsule Take 0.4 mg by mouth daily. 03/03/19   [provider]     Family History  Problem Relation Age of Onset  . Hypertension Mother   . Prostate cancer Father        Prostate cancer  . Cancer Father        Brain Tumor  . Lung cancer Sister        NON SMOKER  . Cancer Sister   . Hyperlipidemia Sister   . Hyperlipidemia Brother   . Heart attack Brother   . Hypertension Brother   . Prostate cancer Brother   . Diabetes Neg Hx   . Stroke Neg Hx     Social History   Socioeconomic History  . Marital status: Married    Spouse name: Not on file  . Number of children: Not on file  . Years of education: Not on file  . Highest education level: Not on file  Occupational History  . Occupation: Retired Estate manager/land agent  . Smoking status: Current Every Day Smoker    Packs/day: 1.00    Years: 24.00    Pack years: 24.00    Types: Cigarettes    Last attempt to quit: 05/28/2013    Years since quitting: 7.3  . Smokeless tobacco: Never Used  . Tobacco comment: QUIT 15 YEARS AGO-07/03/17 smoking  Vaping Use  . Vaping Use: Never used  Substance and Sexual Activity  . Alcohol use: Yes    Alcohol/week:  15.0 standard drinks    Types: 3 Glasses of wine, 12 Cans of beer per week    Comment: 1-2 beers a day  . Drug use: No  . Sexual activity: Not on file  Other Topics Concern  . Not on file  Social History Narrative   HEART HEALTHY DIET.  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   MARRIED, 2 children who live in [place].  Smoked on and off since then.   ALCOHOL USE -YES- RED WINE SOCIALLY, couple of beers at night usually.   07-08-18 Unable to ask abuse questions wife with him today.   Social Determinants of Health   Financial Resource Strain: Not on file  Food Insecurity: Not on file  Transportation Needs: Not on file  Physical Activity: Not on file  Stress: Not on file  Social Connections: Not on file    ECOG Status: 0 - Asymptomatic  Review of Systems  Review of Systems: A 12 point ROS discussed and pertinent positives are indicated in the HPI above.  All other systems are negative.  Physical Exam No direct physical exam was performed (except for noted visual exam findings with Video Visits).   Vital Signs: There were no vitals taken for this visit.  Imaging:  CT abdomen and pelvis - 09/10/2020; 08/26/2019; 01/03/2019; 11/08/2017 Intraoperative images during type II endoleak repair - 12/03/2017  CT Angio Abd/Pel w/ and/or w/o  Result Date: 09/10/2020 CLINICAL DATA:  Endoleak graft, follow-up. Metastatic lung carcinoma post chemotherapy and radiation. EXAM: CTA ABDOMEN AND PELVIS WITHOUT AND WITH CONTRAST TECHNIQUE: Multidetector CT imaging of the abdomen and pelvis was performed using the standard protocol during bolus administration of intravenous contrast. Multiplanar reconstructed images and MIPs were obtained and reviewed to evaluate the vascular anatomy. CONTRAST:  134mL OMNIPAQUE IOHEXOL 350 MG/ML SOLN COMPARISON:  08/26/2019 FINDINGS: VASCULAR Coronary calcifications. Aorta: Nonocclusive mild calcified atheromatous plaque in the visualized distal descending  thoracic and suprarenal segments. Patent bifurcated infrarenal stent graft. Persistent type 2 endoleak in the native sac posterior to the proximal aspect of iliac limbs, with some image degradation secondary to streak artifact from embolization material as before. Native sac transverse diameter 5.5 x 5.2 cm (previously 5.6). Celiac: Patent without evidence of aneurysm, dissection, vasculitis or significant stenosis. SMA: Patent without evidence of aneurysm, dissection, vasculitis or significant stenosis. Renals: Single left, widely patent. Duplicated right, superior dominant with partially calcified ostial plaque, no significant stenosis, patent distally. Inferior right renal artery supplies the atrophic lower pole and appears occluded. IMA: Origin occlusion Inflow: Right limb extends 2 distal common iliac, tines well apposed. Atheromatous ectatic right internal and external iliac arteries without stenosis. Left limb extends to the mid common iliac, tines well apposed. 1.3 cm fusiform dilatation of the proximal left internal iliac artery. Left external iliac is atheromatous, patent. Proximal Outflow: Bilateral common femoral and visualized portions of the superficial and profunda femoral arteries are patent without evidence of aneurysm, dissection, vasculitis or significant stenosis. Veins: Patent hepatic veins, portal vein, SMV, splenic vein, bilateral renal veins. Visualized iliac venous system and IVC unremarkable. Review of the MIP images confirms the above findings. NON-VASCULAR Lower chest: Small pericardial effusion.  No pleural effusion. Hepatobiliary: Partially calcified gallstones measuring up to 1.4 cm diameter. No liver lesion or intrahepatic biliary ductal dilatation. Pancreas: Unremarkable. No pancreatic ductal dilatation or surrounding inflammatory changes. Spleen: Normal in size without focal abnormality. Adrenals/Urinary Tract: Adrenal glands unremarkable. Chronic right lower pole renal parenchymal  atrophy and hypoperfusion. Stable left renal cysts. No hydronephrosis. Thick-walled urinary bladder, incompletely distended. Stomach/Bowel: Stomach nondistended. Small bowel decompressed. Normal appendix. Colon is nondilated. Scattered distal descending and sigmoid diverticula without adjacent inflammatory change. Lymphatic: No abdominal or pelvic adenopathy. Reproductive: Prostate enlargement. Other: No ascites.  No free air. Musculoskeletal: Left inguinal hernia containing only fat. Chronic L4  compression deformity. No fracture or worrisome bone lesion. IMPRESSION: 1. Patent bifurcated infrarenal stent graft with persistent type 2 endoleak, stable native sac diameter 5.5 cm. 2. Chronic right lower pole renal parenchymal atrophy and hypoperfusion. 3. Cholelithiasis. 4. Descending and sigmoid diverticulosis. 5. Small pericardial effusion. 6. Left inguinal hernia containing only fat. Aortic Atherosclerosis (ICD10-I70.0). Electronically Signed   By: Lucrezia Europe M.D.   On: 09/10/2020 14:10    Labs:  CBC: No results for input(s): WBC, HGB, HCT, PLT in the last 8760 hours.  COAGS: No results for input(s): INR, APTT in the last 8760 hours.  BMP: Recent Labs    03/23/20 1136 09/10/20 1322  NA 138  --   K 4.6  --   CL 105  --   CO2 29  --   GLUCOSE 93  --   BUN 16  --   CALCIUM 9.7  --   CREATININE 0.86 0.90    LIVER FUNCTION TESTS: Recent Labs    03/23/20 1136  BILITOT 0.4  AST 15  ALT 16  ALKPHOS 58  PROT 6.8  ALBUMIN 4.2    TUMOR MARKERS: No results for input(s): AFPTM, CEA, CA199, CHROMGRNA in the last 8760 hours.  Assessment and Plan:  Jordan Mccullough a 79 y.o.malewith complex past medical history significant for smoking, hypertension, hyperlipidemia, CAD and lung cancer (with brain metastasis though remains clinically stable without evidence of disease progression) who initially underwent a technically successful repair of infrarenal abdominal aortic aneurysm by Dr.  Trula Slade on 06/08/2014 post percutaneous coil and liquid embolic embolization of type II endoleak on 12/03/2017.  Following examinations were reviewed in detail: CT abdomen and pelvis - 09/10/2020; 08/26/2019; 01/03/2019; 11/08/2017 Intraoperative images during type II endoleak repair - 12/03/2017  CTA of the abdomen pelvis performed 09/10/2020 demonstrates the sequela of previous liquid and coil embolization of type II endoleak with persistent tiny type II endoleak via the posterior aspect of the aneurysm sac, likely supplied via a combination of the bilateral L2-L3 lumbar arteries as well as a tiny accessory right renal artery.    The otherwise excluded native abdominal aortic aneurysm sac currently measures approximately 4.8 x 5.4 x 6.2 cm, as measured in greatest oblique short axis axial (image 57, series 6), coronal (image 57, series 7) and sagittal (image 82, series 8), dimensions respectively, previously, 5.3 5.4 x 5.9 cm when compared to pre endoleak repair CTA performed 11/08/2017.  As such, the aneurysm has been stable for nearly 3 years.  I explained that given stability for the past 3 years, it is very unlikely there will be continued growth in the native abdominal aortic aneurysm as residual type II endoleak is likely low pressure and less conspicuous on present examination however we will proceed with obtaining one additional CTA of the abdomen pelvis in 1 year though ultimately I am hopeful we will  forego continued dedicated surveillance.  The patient is agreement with the proposed plan of care and knows to call the interventional radiology clinic with any interval questions or concerns.  PLAN: - Televisit following acquisition of surveillance post stent CTA in 1 year (08/2021)  A copy of this report was sent to the requesting provider on this date.  Electronically Signed: Sandi Mariscal 09/16/2020, 8:57 AM   I spent a total of 5 Minutes in remote  clinical consultation, greater than 50% of  which was counseling/coordinating care for post endoleak repair.    Visit type: Audio only (telephone). Audio (no video) only due  to patient's lack of internet/smartphone capability. Alternative for in-person consultation at [facility], [address], [place], [place]. This visit type was conducted due to national recommendations for restrictions regarding the COVID-19 Pandemic (e.g. social distancing).  This format is felt to be most appropriate for this patient at this time.  All issues noted in this document were discussed and addressed.

## 2020-09-23 ENCOUNTER — Other Ambulatory Visit: Payer: Self-pay | Admitting: Family Medicine

## 2020-09-24 ENCOUNTER — Other Ambulatory Visit: Payer: Self-pay | Admitting: Radiation Therapy

## 2020-09-24 DIAGNOSIS — C7931 Secondary malignant neoplasm of brain: Secondary | ICD-10-CM

## 2020-09-27 ENCOUNTER — Telehealth: Payer: Self-pay | Admitting: Family Medicine

## 2020-09-27 NOTE — Telephone Encounter (Signed)
Received faxed medical clearance form from [facility] in Dr. Idelle Leech inbox in front office to be signed.

## 2020-09-28 NOTE — Telephone Encounter (Signed)
Placed on PCP desk to review and sign, if appropriate.  

## 2020-09-28 NOTE — Telephone Encounter (Signed)
Dentist office called to check status on this form. The patient is scheduled for 10/05/20 and they would like this form completed and returned before that date.

## 2020-09-29 NOTE — Telephone Encounter (Signed)
Signed and put in box to go up front. Signed:  Crissie Sickles, MD           09/29/2020

## 2020-10-05 ENCOUNTER — Ambulatory Visit: Payer: Medicare Other | Admitting: Neurology

## 2020-10-05 ENCOUNTER — Encounter: Payer: Self-pay | Admitting: Neurology

## 2020-10-05 ENCOUNTER — Other Ambulatory Visit: Payer: Self-pay

## 2020-10-05 VITALS — BP 156/81 | HR 78 | Ht 64.0 in | Wt 142.6 lb

## 2020-10-05 DIAGNOSIS — G40209 Localization-related (focal) (partial) symptomatic epilepsy and epileptic syndromes with complex partial seizures, not intractable, without status epilepticus: Secondary | ICD-10-CM

## 2020-10-05 MED ORDER — LAMOTRIGINE 200 MG PO TABS: ORAL_TABLET | ORAL | 3 refills | Status: AC

## 2020-10-05 NOTE — Patient Instructions (Signed)
Always a pleasure to see you. Continue Lamotrigine 200mg : Take 1 tablet in morning, 1 and 1/2 tablets in evening. Follow-up in 1 year, call for any changes.   Seizure Precautions: 1. If medication has been prescribed for you to prevent seizures, take it exactly as directed.  Do not stop taking the medicine without talking to your doctor first, even if you have not had a seizure in a long time.   2. Avoid activities in which a seizure would cause danger to yourself or to others.  Don't operate dangerous machinery, swim alone, or climb in high or dangerous places, such as on ladders, roofs, or girders.  Do not drive unless your doctor says you may.  3. If you have any warning that you may have a seizure, lay down in a safe place where you can't hurt yourself.    4.  No driving for 6 months from last seizure, as per [facility].   Please refer to the following link on the [facility] website for more information: http://[email or web address].org/answerplace/Social/driving/drivingu.cfm   5.  Maintain good sleep hygiene. Avoid alcohol.  6.  Contact your doctor if you have any problems that may be related to the medicine you are taking.  7.  Call 911 and bring the patient back to the ED if:        A.  The seizure lasts longer than 5 minutes.       B.  The patient doesn't awaken shortly after the seizure  C.  The patient has new problems such as difficulty seeing, speaking or moving  D.  The patient was injured during the seizure  E.  The patient has a temperature over 102 F (39C)  F.  The patient vomited and now is having trouble breathing

## 2020-10-05 NOTE — Progress Notes (Signed)
NEUROLOGY FOLLOW UP OFFICE NOTE  JOLAN UPCHURCH [id number]  HISTORY OF PRESENT ILLNESS: I had the pleasure of seeing Rakesh Dutko in follow-up in the neurology clinic on 10/05/2020.  The patient was last seen a year ago for seizures secondary to brain metastases. He is alone in the office today. Records and images were personally reviewed where available.  I personally reviewed MRI brain with and without contrast done 06/2020 which did not show any new changes, stable 2 treated lesions in the right frontal parietal lobe without interval growth. He denies any convulsions since 2016, no focal motor seizures affecting the left side of his face since May 2018. He has brief tingling in his left thumb or muscle spasms on his left cheek that go away in less than a minute. When he looks in the mirror, he does not see any movement. No associated confusion, headaches. They occur around once every 1-2 months. He continues to have some left-sided weakness and difficulties with fine motor movements. He gets tired after walking 15-20 minutes and would take a nap for an hour. No headaches, dizziness, no falls. Sleep is good, mood is wonderful.    History on Initial Assessment 10/11/2015: This is a pleasant 80 yo RH man with a history of lung cancer s/p lobectomy, chemotherapy and radiation, hyperlipidemia, CAD s/p MI, peripheral vascular disease, abdominal aortic aneurysm, aditted to [facility] last 08/04/15 after a new onset seizure and found to have brain metastases. He states he got his flu shot then went to work, got home and started having a beer the threw up, his eyes got blurry to the point he could not see anything, then has no recollection of events. According to ER notes, after the nausea and vomiting, he decided to drink beer to feel better, his wife left the room then came back to find him with arms extended, shaking, eyes rolled back lasting 5-10 seconds. He recalls coming to without feeling confused, no  focal weakness. He was brought to [facility] ER where CT head showed a 1.6 x 1.5 cm hyperdense mass in the right frontal lobe worrisome for focal metastases. MRI was done showing 2 enhancing masses in the right frontal lobe measuring up to 2 x 1.7 cm, no mass effect. He was started on Keppra. He underwent stereotactic radiation.   He called our office in July 2017 to report facial seizures. He had 5 in one day lasting 5-6 seconds, then stopped. His wife called our office to report 4 facial seizures on 7/19 and one on 7/20, these were lasting 5-6 seconds, he could feel it coming from the left jawline going up to his forehead, with facial twitching seen. No associated headache or confusion. He has a constant feeling of numbness on the left side of his face, "like you had Novocaine." He continues to have left arm and leg weakness that has been chronic, no twitching involved. He feels he has a hard time controlling them. He denies any falls. Keppra dose was increased to 1000mg  qhs.  He denies any prior history of seizures. His wife denies any staring/unresponsive episodes, no gaps in time, olfactory/gustatory hallucinations, deja vu, rising epigastric sensation, focal numbness/tingling/weakness, myoclonic jerks, headaches, dysarthria/dysphagia, bowel/bladder dysfunction. He was discharged home on Decadron and Keppra, and reports that he was "just totally out of it," functioning only 50%. He felt significantly better getting of Decadron, but continued to have generalized weakness, particularly in the legs, blurred vision, and reported this to his PCP Dr.  McGowen. Keppra dose was reduced to 250mg  BID 5 days ago, and he reports that symptoms are better, but still there. He states that he wakes up feeling fine, but then starts feeling the symptoms after taking morning Keppra dose. He has mild 2-3 over 10 back pain when walking, relieved when he sits down. He continues to drink beer daily but has reduced to 1-2 a day from 3-5  beers a day. His wife reports he would drink 3 beer cases in a week.  Epilepsy Risk Factors: Brain metastases in the right frontal lobe. Otherwise he had a normal birth and early development. There is no history of febrile convulsions, CNS infections such as meningitis/encephalitis, significant traumatic brain injury, neurosurgical procedures, or family history of seizures.   I personally reviewed MRI brain with and without contrast done 08/12/15 which showed an 8x63mm centrally necrotic metastasis in the right posterior frontal lobe; there is a centrally necrotic mass measuring 83mm at the right vertex frontoparietal junction with regional vasogenic edema. Routine EEG 08/05/15 was a normal wake and sleep study  PAST MEDICAL HISTORY: Past Medical History:  Diagnosis Date  . AAA (abdominal aortic aneurysm) (HCC)    s/p repair (aortoiliac bipass; with persistent endograft leak in inferior mesenteric artery)--stable as of 01/2017 vascular f/u.  Marland Kitchen Asymptomatic cholelithiasis 07/2015   Incidental finding on PET CT  . Back pain 04/19/2016  . BPH with elevated PSA    PSA signif rise 11/2017 (5.4 in 2015 to 31.21 November 2017. Bx 03/2018 benign.  . Coronary artery disease    MI 1992, S/P  PTCA; negative stress test in November 2011 with no ischemia.   . Diverticulosis   . Elevated PSA 01/2018   01/22/18:  PSA 26.5.  Prostate MRI with abnormality->subsequent prostate bx BENIGN 04/12/18.PSA 72019 stbl at 24.1->rose to 36 03/2020->rec'd return to urol.  [name] hematuria 05/2018   Occurred 2+ mo's after prostate bx: attributed to bx by urol, treated empirically with keflex.  Urol initially suspected gross hem was sequela of recent prost bx. BUT recurrence of gross hem prompted full hematuria w/u 11/15/18.  Marland Kitchen Hearing loss    Bilateral   . History of radiation therapy 11/10/13-12/12/13   lung,50Gy/64fx  . Hyperlipidemia    Crestor= myalgias  . Hypertension   . Lung cancer ([place]) 05/2013   non-small cell;  L  upper lobectomy with mediastinal LN dissection, chemo, and radiation in 2014/2015. Remission until pt had questionable seizure 07/2015--MRI showed brain mets; palliative brain rad (stereotactic radiation therapy) started 08/25/15.  CT C/A/P clear 02/2016, 05/2016, 11/2016, 12/2018. MRI brain 12/2018 stable lesions/no new mets. Rpt 6 mo per onc.  . Malignant neoplasm of upper lobe, left bronchus or lung ([place]) 10/29/2013   *?New spiculated 4.5cm mass in L upper lung field on CXR at [facility] in [place] 07/14/17.  Dr. Julien Nordmann (onc) recommended f/u CXR after abx course.  If mass unchanged then repeat CT chest.  Surveillance CT chest and MRI brain 06/2018 show no sign of dz.  . Myocardial infarction [facility]) 1992   Dr Angelena Form    . Peripheral vascular disease ([place]) 08/2012   4.8x4.6 cm infrarenal abdominal aortic fusiform aneurysm, 1.5 cm right common iliac artery aneurysm.  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  Marland Kitchen Pneumonia 09/06/2018   XRAY was done at [facility].  . S/P radiation therapy [facility] 08/25/15   Stereotactic  radiation therapy: frontoparietal 18gy,posterior frontal lobe 20gy,  . Seizure disorder ([place]) 2016/2017   as sequela of brain mets;  Grand mal seizure 07/2015, then got on keppra and was seizure-free until focal motor seizures of L side of face began 02/2016- these responded well to up-titration of keppra but pt had adverse side effects so eventually pt had to be switched over to lamictal 07/2016--stable/seizure free on this med as of 09/2018 neuro f/u.  Marland Kitchen Shortness of breath   . Tobacco dependence    "Quit" 1992, but pt has smoked "on and off" since that time    MEDICATIONS: Current Outpatient Medications on File Prior to Visit  Medication Sig Dispense Refill  . amLODipine (NORVASC) 5 MG tablet TAKE 1 TABLET BY MOUTH EVERY DAY 90 tablet 1  . atorvastatin (LIPITOR) 20 MG  tablet TAKE 1 TABLET BY MOUTH EVERY DAY 90 tablet 1  . budesonide-formoterol (SYMBICORT) 160-4.5 MCG/ACT inhaler INHALE 1-2 PUFFS INTO THE LUNGS EVERY 12 HOURS. GARGLE AND SPIT AFTER USE. 10.2 each 11  . lamoTRIgine (LAMICTAL) 200 MG tablet TAKE 1 TABLET BY MOUTH IN THE MORNING AND 1&1/2 TABLETS IN THE EVENING 225 tablet 1  . metoprolol tartrate (LOPRESSOR) 50 MG tablet TAKE 1 TABLET BY MOUTH TWICE A DAY 180 tablet 1  . Multiple Vitamin (MULTIVITAMIN WITH MINERALS) TABS tablet Take 1 tablet by mouth daily.    . predniSONE (DELTASONE) 50 MG tablet Take 50mg  prednisone on 09/09/20 @ 12 midnight, then take 50mg  prednisone on 09/10/20 @ 6am, then take 50mg  prednisone on 09/10/20 @ 12 noon AND take 50mg  Benadryl on 09/10/20 @ 12 noon. 3 tablet 0  . tamsulosin (FLOMAX) 0.4 MG CAPS capsule Take 0.4 mg by mouth daily.    . NONFORMULARY OR COMPOUNDED ITEM Antifungal solution: Terbinafine 3%, Fluconazole 2%, Tea Tree Oil 5%, Urea 10%, Ibuprofen 2% in DMSO suspension #38mL (Patient not taking: Reported on 10/05/2020) 1 each 3   Current Facility-Administered Medications on File Prior to Visit  Medication Dose Route Frequency Provider Last Rate Last Admin  . diphenhydrAMINE (BENADRYL) capsule 50 mg  50 mg Oral Once Sandi Mariscal, MD        ALLERGIES: Allergies  Allergen Reactions  . Quinolones Other (See Comments)    Pt with aneurism: need to avoid quinoline use b/c of weakening of vascular wall that can be associated with quinolones.  . Wellbutrin [Bupropion] Other (See Comments)    AVOID IN PT W/HX OF SEIZURES  . Rosuvastatin Other (See Comments)    Myalgias and dark urine  . Iodinated Diagnostic Agents Hives    1 hive on lt cheek lasting approximately 1 hour on last 2 CT per pt; needs pre meds in future; 50 mg benadryl po 1 hr prior to exam per Dr. Weber Cooks 07/2014  11/2016 per Tery Sanfilippo, pt needs 13hr premeds per our policy.    FAMILY HISTORY: Family History  Problem Relation Age of Onset  .  Hypertension Mother   . Prostate cancer Father        Prostate cancer  . Cancer Father        Brain Tumor  . Lung cancer Sister        NON SMOKER  . Cancer Sister   . Hyperlipidemia Sister   . Hyperlipidemia Brother   . Heart attack Brother   . Hypertension Brother   . Prostate cancer Brother   . Diabetes Neg Hx   . Stroke Neg Hx     SOCIAL HISTORY: Social History  Socioeconomic History  . Marital status: Married    Spouse name: Not on file  . Number of children: Not on file  . Years of education: Not on file  . Highest education level: Not on file  Occupational History  . Occupation: Retired Estate manager/land agent  . Smoking status: Current Every Day Smoker    Packs/day: 1.00    Years: 24.00    Pack years: 24.00    Types: Cigarettes    Last attempt to quit: 05/28/2013    Years since quitting: 7.3  . Smokeless tobacco: Never Used  . Tobacco comment: QUIT 15 YEARS AGO-07/03/17 smoking  Vaping Use  . Vaping Use: Never used  Substance and Sexual Activity  . Alcohol use: Yes    Alcohol/week: 15.0 standard drinks    Types: 3 Glasses of wine, 12 Cans of beer per week    Comment: 1-2 beers a day  . Drug use: No  . Sexual activity: Not on file  Other Topics Concern  . Not on file  Social History Narrative   HEART HEALTHY DIET.     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   MARRIED, 2 children who live in [place].  Smoked on and off since then.   ALCOHOL USE -YES- RED WINE SOCIALLY, couple of beers at night usually.   07-08-18 Unable to ask abuse questions wife with him today.   Right handed    Social Determinants of Health   Financial Resource Strain: Not on file  Food Insecurity: Not on file  Transportation Needs: Not on file  Physical Activity: Not on file  Stress: Not on file  Social Connections: Not on file  Intimate Partner Violence: Not on file     PHYSICAL EXAM: Vitals:   10/05/20 1352  BP: (!) 156/81  Pulse: 78  SpO2: 96%    General: No acute distress Head:  Normocephalic/atraumatic Skin/Extremities: No rash, no edema Neurological Exam: alert and awake. No aphasia or dysarthria. Fund of knowledge is appropriate.  Recent and remote memory are intact.  Attention and concentration are normal.   Cranial nerves: Pupils equal, round. Extraocular movements intact with no nystagmus. Visual fields full.  No facial asymmetry.  Motor: 5/5 on right UE and LE, 4/5 proximal left UE, 5/5 left LE. Reflexes +2 throughout. Finger to nose testing intact.  Gait narrow-based and steady, no ataxia   IMPRESSION: This is a pleasant 80 yo RH man with a history of lung cancer s/p lobectomy, radiation, chemotherapy, found to have 2 right frontal brain metastases after he had a convulsive seizure on 08/04/15. Routine EEG normal. Follow-up brain MRI in November 2021 stable. No convulsions since December 2016, no focal motor seizures affecting his face since May 2018. He reports brief episodes of tingling on the left thumb and left cheek that may be sensory seizures with no loss of consciousness, would not increase seizure medication for these, we discussed continuation of current dose Lamotrigine 200mg  1 tab in AM, 1.5 tabs in PM which he agrees with. Continue home exercises for chronic mild left-sided weakness. He is aware of [place] driving laws to stop driving after a seizure until 6 months seizure-free. Follow-up in 1 year, he knows to call for any changes.    Thank you for allowing me to participate in his care.  Please do not hesitate to call for any questions or concerns.   Ellouise Newer, M.D.   CC: Dr. Anitra Lauth

## 2020-10-28 ENCOUNTER — Telehealth: Payer: Self-pay | Admitting: Medical Oncology

## 2020-10-28 NOTE — Telephone Encounter (Signed)
?  CT and f/u appt . Pt agrees for scan and appt with [facility]

## 2020-11-09 ENCOUNTER — Ambulatory Visit: Payer: Medicare Other | Admitting: Podiatry

## 2020-11-09 ENCOUNTER — Other Ambulatory Visit: Payer: Self-pay

## 2020-11-09 ENCOUNTER — Encounter: Payer: Self-pay | Admitting: Podiatry

## 2020-11-09 DIAGNOSIS — M79674 Pain in right toe(s): Secondary | ICD-10-CM

## 2020-11-09 DIAGNOSIS — M79675 Pain in left toe(s): Secondary | ICD-10-CM

## 2020-11-09 DIAGNOSIS — B351 Tinea unguium: Secondary | ICD-10-CM | POA: Diagnosis not present

## 2020-11-11 ENCOUNTER — Other Ambulatory Visit: Payer: Self-pay | Admitting: Radiation Therapy

## 2020-11-14 NOTE — Progress Notes (Signed)
Subjective: Jordan Mccullough is a 80 y.o. male patient seen today painful mycotic nails b/l that are difficult to trim. Pain interferes with ambulation. Aggravating factors include wearing enclosed shoe gear. Pain is relieved with periodic professional debridement.   Patient voices no new pedal concerns on today's visit.   PCP is Dr. Shawnie Dapper and last visit was 03/23/2020.  Allergies  Allergen Reactions  . Quinolones Other (See Comments)    Pt with aneurism: need to avoid quinoline use b/c of weakening of vascular wall that can be associated with quinolones.  . Wellbutrin [Bupropion] Other (See Comments)    AVOID IN PT W/HX OF SEIZURES  . Rosuvastatin Other (See Comments)    Myalgias and dark urine  . Iodinated Diagnostic Agents Hives    1 hive on lt cheek lasting approximately 1 hour on last 2 CT per pt; needs pre meds in future; 50 mg benadryl po 1 hr prior to exam per Dr. Weber Cooks 07/2014  11/2016 per Tery Sanfilippo, pt needs 13hr premeds per our policy.   Objective: Physical Exam  General: Patient is a pleasant 80 y.o. Caucasian male in no acute distress, Awake, alert and oriented x 3  Neurovascular status unchanged b/l.  Capillary refill time to digits immediate b/l. Palpable DP pulses b/l. Palpable PT pulses b/l. Pedal hair present b/l. Skin temperature gradient within normal limits b/l. No pain with calf compression b/l. No edema noted b/l.   Protective sensation intact 5/5 intact bilaterally with 10g monofilament b/l. Vibratory sensation intact b/l. Proprioception intact bilaterally.  Dermatological:  Pedal skin with normal turgor, texture and tone bilaterally. No open wounds bilaterally. No interdigital macerations bilaterally. Toenails 1-5 b/l softened, elongated, discolored, dystrophic, thickened,  with subungual debris and tenderness to dorsal palpation.  Musculoskeletal:  Normal muscle strength 5/5 to all lower extremity muscle groups bilaterally. No pain crepitus or joint  limitation noted with ROM b/l. No gross bony deformities bilaterally.  Assessment and Plan:  1. Pain due to onychomycosis of toenails of both feet     -Examined patient. -No new findings. No new orders. -Toenails 1-5 b/l were debrided in length and girth with sterile nail nippers and dremel without iatrogenic bleeding.  -Continue to use compounded topical antifungal solution from [place] daily on toenails. -Patient to report any pedal injuries to medical professional immediately. -Patient to continue soft, supportive shoe gear daily. -Patient/POA to call should there be question/concern in the interim.  Return in about 3 months (around 02/09/2021).  Marzetta Board, DPM

## 2020-12-06 ENCOUNTER — Encounter: Payer: Self-pay | Admitting: Family Medicine

## 2020-12-06 NOTE — Telephone Encounter (Signed)
Pt advised to treat symptoms with Tylenol and ibuprofen and keep hydrated. Pt does not have fever today and no SOB. Advised to quarantine for at least 5 days per [facility].

## 2020-12-07 DIAGNOSIS — U071 COVID-19: Secondary | ICD-10-CM

## 2020-12-07 HISTORY — DX: COVID-19: U07.1

## 2020-12-09 ENCOUNTER — Encounter: Payer: Self-pay | Admitting: Family Medicine

## 2020-12-10 ENCOUNTER — Telehealth (INDEPENDENT_AMBULATORY_CARE_PROVIDER_SITE_OTHER): Payer: Medicare Other | Admitting: Internal Medicine

## 2020-12-10 ENCOUNTER — Encounter: Payer: Self-pay | Admitting: Internal Medicine

## 2020-12-10 DIAGNOSIS — U071 COVID-19: Secondary | ICD-10-CM | POA: Diagnosis not present

## 2020-12-10 NOTE — Assessment & Plan Note (Signed)
Acute Tested positive 12/05/20 with a home test and positive again 12/06/2020 Symptoms are worsening and he is experiencing fever, significant weakness, shortness of breath with activity, cough, difficulty sleeping His wife also has COVID and has been placed on treatment for COVID Referral ordered to the [place] treatment center Discussed at length with him that if his shortness of breath worsens or his symptoms worsen before hearing from them he does need to go to the emergency room and he agrees and understands and will not hesitate to go Can continue over-the-counter cold medications for symptom relief

## 2020-12-10 NOTE — Progress Notes (Signed)
Virtual Visit via telephone note, failed video secondary to audio not working  I connected with Jordan Mccullough on 12/10/20 at  2:40 PM EDT by telephone and verified that I am speaking with the correct person using two identifiers.   I discussed the limitations of evaluation and management by telemedicine and the availability of in person appointments. The patient expressed understanding and agreed to proceed.  Present for the visit:  Myself, Dr Billey Gosling, Jason Coop.  The patient is currently at home and I am in the office.    No referring provider.    History of Present Illness: He is here for an acute visit for cold symptoms.  His symptoms started 5 -6 days ago after returning from the beach.  His wife has similar symptoms.  He took a home COVID test on Sunday, 4/17 and it was positive.  He retook another test on 4/18 and it was positive again.  He is experiencing significant weakness and he is having difficulty sleeping.  He feels very short of breath with activity, but he states it does not last long.  He has had fevers, nasal congestion, sinus pain, sore throat and is hoarse.  He states coughing, wheezing and headaches.  He is concerned because his symptoms are getting worse, especially shortness of breath.   He has tried taking mucinex, nyquil  His wife also tested positive and was started on medication to help treat COVID.  He thinks he needs a prescription for that as well.   Review of Systems  Constitutional: Positive for fever and malaise/fatigue.       Generalized weakness  HENT: Positive for congestion, sinus pain and sore throat.        Hoarse No loss or taste/ smell  Respiratory: Positive for cough, shortness of breath (with activity only) and wheezing.   Musculoskeletal: Negative for myalgias.  Neurological: Positive for headaches.      Social History   Socioeconomic History  . Marital status: Married    Spouse name: Not on file  . Number of children: Not  on file  . Years of education: Not on file  . Highest education level: Not on file  Occupational History  . Occupation: Retired Estate manager/land agent  . Smoking status: Current Every Day Smoker    Packs/day: 1.00    Years: 24.00    Pack years: 24.00    Types: Cigarettes    Last attempt to quit: 05/28/2013    Years since quitting: 7.5  . Smokeless tobacco: Never Used  . Tobacco comment: QUIT 15 YEARS AGO-07/03/17 smoking  Vaping Use  . Vaping Use: Never used  Substance and Sexual Activity  . Alcohol use: Yes    Alcohol/week: 15.0 standard drinks    Types: 3 Glasses of wine, 12 Cans of beer per week    Comment: 1-2 beers a day  . Drug use: No  . Sexual activity: Not on file  Other Topics Concern  . Not on file  Social History Narrative   HEART HEALTHY DIET.     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   MARRIED, 2 children who live in [place].  Smoked on and off since then.   ALCOHOL USE -YES- RED WINE SOCIALLY, couple of beers at night usually.   07-08-18 Unable to ask abuse questions wife with him today.   Right handed    Social Determinants of Health   Financial Resource Strain: Not on file  Food  Insecurity: Not on file  Transportation Needs: Not on file  Physical Activity: Not on file  Stress: Not on file  Social Connections: Not on file     Observations/Objective: Appears well in NAD-I did see him briefly before we had to end the failed video visit and he was not in any acute distress and breathing normally   Assessment and Plan:  See Problem List for Assessment and Plan of chronic medical problems.   Follow Up Instructions:    I discussed the assessment and treatment plan with the patient. The patient was provided an opportunity to ask questions and all were answered. The patient agreed with the plan and demonstrated an understanding of the instructions.   The patient was advised to call back or seek an in-person evaluation if the symptoms  worsen or if the condition fails to improve as anticipated.  Time spent telephone call: 15 minutes.  Binnie Rail, MD

## 2020-12-11 ENCOUNTER — Telehealth: Payer: Self-pay | Admitting: Unknown Physician Specialty

## 2020-12-11 NOTE — Telephone Encounter (Signed)
Called to discuss with patient about COVID-19 symptoms and the use of one of the available treatments for those with mild to moderate Covid symptoms and at a high risk of hospitalization.  Pt appears to qualify for outpatient treatment due to co-morbid conditions and/or a member of an at-risk group in accordance with the FDA Emergency Use Authorization.    Symptom onset: >5 days Vaccinated: yes Booster?  Qualifiers: Age and other comorbid factors  Unable to reach pt - LMOM that our of range for oral medication effectiveness (5 days) and mab tx (7 days on next infusion day of Monday.    Kathrine Haddock

## 2020-12-12 ENCOUNTER — Encounter: Payer: Self-pay | Admitting: Family Medicine

## 2020-12-28 ENCOUNTER — Telehealth: Payer: Self-pay

## 2020-12-28 NOTE — Telephone Encounter (Signed)
Spoke with patient via phone meaningful use complete.Satets he has an MRI on Thursday and phone appointment with Bryson Ha on May 16th at 1:30. Questions and concerns addressed.

## 2020-12-30 ENCOUNTER — Ambulatory Visit
Admission: RE | Admit: 2020-12-30 | Discharge: 2020-12-30 | Disposition: A | Discharge status: Home / Self Care | Payer: Medicare Other | Source: Ambulatory Visit | Attending: Radiation Oncology | Admitting: Radiation Oncology

## 2020-12-30 ENCOUNTER — Other Ambulatory Visit: Payer: Self-pay

## 2020-12-30 DIAGNOSIS — G319 Degenerative disease of nervous system, unspecified: Secondary | ICD-10-CM | POA: Diagnosis not present

## 2020-12-30 DIAGNOSIS — Z85118 Personal history of other malignant neoplasm of bronchus and lung: Secondary | ICD-10-CM | POA: Diagnosis not present

## 2020-12-30 DIAGNOSIS — C7931 Secondary malignant neoplasm of brain: Secondary | ICD-10-CM | POA: Diagnosis not present

## 2020-12-30 DIAGNOSIS — J3489 Other specified disorders of nose and nasal sinuses: Secondary | ICD-10-CM | POA: Diagnosis not present

## 2020-12-30 MED ORDER — GADOBENATE DIMEGLUMINE 529 MG/ML IV SOLN
13.0000 mL | Freq: Once | INTRAVENOUS | Status: AC | PRN
  Administered 2020-12-30: 13 mL via INTRAVENOUS

## 2021-01-03 ENCOUNTER — Inpatient Hospital Stay: Payer: Medicare Other | Attending: Radiation Oncology

## 2021-01-03 ENCOUNTER — Ambulatory Visit
Admission: RE | Admit: 2021-01-03 | Discharge: 2021-01-03 | Disposition: A | Discharge status: Home / Self Care | Payer: Medicare Other | Source: Ambulatory Visit | Attending: Radiation Oncology | Admitting: Radiation Oncology

## 2021-01-03 DIAGNOSIS — Z08 Encounter for follow-up examination after completed treatment for malignant neoplasm: Secondary | ICD-10-CM | POA: Diagnosis not present

## 2021-01-03 DIAGNOSIS — C7931 Secondary malignant neoplasm of brain: Secondary | ICD-10-CM | POA: Diagnosis not present

## 2021-01-03 DIAGNOSIS — C3412 Malignant neoplasm of upper lobe, left bronchus or lung: Secondary | ICD-10-CM

## 2021-01-03 DIAGNOSIS — Z85118 Personal history of other malignant neoplasm of bronchus and lung: Secondary | ICD-10-CM | POA: Diagnosis not present

## 2021-01-03 NOTE — Addendum Note (Signed)
Encounter addended by: Hayden Pedro, PA-C on: 01/03/2021 2:24 PM  Actions taken: Clinical Note Signed

## 2021-01-03 NOTE — Progress Notes (Addendum)
Radiation Oncology         [phone]) [phone]  Follow Up Evaluation - Conducted via telephone due to current COVID-19 concerns for limiting patient exposure  I spoke with the patient to conduct this visit via telephone to spare the patient unnecessary potential exposure in the healthcare setting during the current COVID-19 pandemic. The patient was notified in advance and was offered a [facility] meeting to allow for face to face communication but unfortunately reported that they did not have the appropriate resources/technology to support such a visit and instead preferred to proceed with a telephone conversation.  ________________________________  Name: Jordan Mccullough MRN: [id number]  Date: 01/03/2021  DOB: 1941-08-12  Follow-Up Visit Note  CC: Tammi Sou, MD    Diagnosis:   Metastatic Lung Cancer  Interval Since Radiotherapy: 5 years, 4  Months  08/25/15 SRS Treatment:    1.  PTV1 Rt Vertex Frontoparietal 51mm target was treated using 4 Arcs to a prescription dose of 18 Gy. ExacTrac Snap verification was performed for each couch angle.  2.  PTV2  Rt Posterior Frontal Lobe 48mm target was treated using 4 Arcs to a prescription dose of 20 Gy. ExacTrac Snap verification was performed for each couch angle.  Narrative:   Jordan Mccullough is a very pleasant 80 y.o. gentleman with a history of metastatic non-small cell lung cancer who received SRS to 2 lesions in the frontoparietal region which he completed in January of 2017. He has had stability in the brain since his treatment. He was treated with trental/vitamine E for radionecrosis which has resolved. He also follows with Dr. Delice Lesch for his history of seizures and will be due to see her in February 2022. He is overdue still to see Dr. Julien Nordmann who has followed him in observation. The patient had am MRI on  12/30/20 that showed now new or concerns for active disease in the brain. He's contacted to discuss the results.   On review of systems, the patient  reports that he is doing well overall. He reports he is feeling quite well but has had some hoarseness. He had Covid a few weeks ago as did his wife but they've both recovered well. He denies any headaches, visual, or auditory changes. He is not having any seizures, or movement difficulties. No shortness of breath or chest pain is noted. No other concerns are verbalized.   Past Medical History:  Past Medical History:  Diagnosis Date  . AAA (abdominal aortic aneurysm) (HCC)    s/p repair (aortoiliac bipass; with persistent endograft leak in inferior mesenteric artery)--stable as of 01/2017 vascular f/u.  Marland Kitchen Asymptomatic cholelithiasis 07/2015   Incidental finding on PET CT  . Back pain 04/19/2016  . BPH with elevated PSA    PSA signif rise 11/2017 (5.4 in 2015 to 31.21 November 2017. Bx 03/2018 benign.  . Coronary artery disease    MI 1992, S/P  PTCA; negative stress test in November 2011 with no ischemia.   Marland Kitchen COVID-19 virus infection 12/07/2020  . Diverticulosis   . Elevated PSA 01/2018   01/22/18:  PSA 26.5.  Prostate MRI with abnormality->subsequent prostate bx BENIGN 04/12/18.PSA 72019 stbl at 24.1->rose to 36 03/2020->rec'd return to urol.  [name] hematuria 05/2018   Occurred 2+ mo's after prostate bx: attributed to bx by urol, treated empirically with keflex.  Urol initially suspected gross hem was sequela of recent prost bx. BUT recurrence of gross hem prompted full hematuria w/u 11/15/18.  Marland Kitchen Hearing loss  Bilateral   . History of radiation therapy 11/10/13-12/12/13   lung,50Gy/87fx  . Hyperlipidemia    Crestor= myalgias  . Hypertension   . Lung cancer ([place]) 05/2013   non-small cell;  L upper lobectomy with mediastinal LN dissection, chemo, and radiation in 2014/2015. Remission until pt had questionable seizure 07/2015--MRI showed brain mets; palliative brain rad (stereotactic radiation therapy) started 08/25/15.  CT C/A/P clear 02/2016, 05/2016, 11/2016, 12/2018. MRI brain 12/2018 stable lesions/no  new mets. Rpt 6 mo per onc.  . Malignant neoplasm of upper lobe, left bronchus or lung ([place]) 10/29/2013   *?New spiculated 4.5cm mass in L upper lung field on CXR at [facility] in [place] 07/14/17.  Dr. Julien Nordmann (onc) recommended f/u CXR after abx course.  If mass unchanged then repeat CT chest.  Surveillance CT chest and MRI brain 06/2018 show no sign of dz.  . Myocardial infarction [facility]) 1992   Dr Angelena Form    . Peripheral vascular disease ([place]) 08/2012   4.8x4.6 cm infrarenal abdominal aortic fusiform aneurysm, 1.5 cm right common iliac artery aneurysm.  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  Marland Kitchen Pneumonia 09/06/2018   XRAY was done at [facility].  . S/P radiation therapy [facility] 08/25/15   Stereotactic radiation therapy: frontoparietal 18gy,posterior frontal lobe 20gy,  . Seizure disorder ([place]) 2016/2017   as sequela of brain mets;  Grand mal seizure 07/2015, then got on keppra and was seizure-free until focal motor seizures of L side of face began 02/2016- these responded well to up-titration of keppra but pt had adverse side effects so eventually pt had to be switched over to lamictal 07/2016--stable/seizure free on this med as of 09/2018 neuro f/u.  Marland Kitchen Shortness of breath   . Tobacco dependence    "Quit" 1992, but pt has smoked "on and off" since that time    Past Surgical History: Past Surgical History:  Procedure Laterality Date  . ABDOMINAL AORTIC ENDOVASCULAR STENT GRAFT N/A 05/07/2014   Procedure: ABDOMINAL AORTIC ENDOVASCULAR STENT GRAFT;  Surgeon: Serafina Mitchell, MD;  Location: [place] OR;  Service: Vascular;  Laterality: N/A;  . Carotid duplex dopplers  07/2015   1-39% on R, no signif dz noted on L  . COLONOSCOPY W/ POLYPECTOMY  2003    negative 2010,due 2020; Dr Olevia Perches  . CORONARY ANGIOPLASTY     no stents  . CYSTOSCOPY/RETROGRADE/URETEROSCOPY Bilateral 10/09/2012    Procedure: BILATERAL RETROGRADE bladder and urethral BIOPSY ;  Surgeon: Molli Hazard, MD;  Location: [facility];  Service: Urology;  Laterality: Bilateral;  BILATERAL RETROGRADE   . EEG  08/05/15   Pt placed on keppra just prior to this test due to having ? seizure (MRI showed brain mets)  . HERNIA REPAIR Bilateral    Inguinal  . INGUINAL HERIIORRHAPHY BILATERALLY    . IR ANGIOGRAM SELECTIVE EACH ADDITIONAL VESSEL  12/03/2017  . IR ANGIOGRAM VISCERAL SELECTIVE  12/03/2017  . IR ANGIOGRAM VISCERAL SELECTIVE  12/03/2017  . IR EMBO ARTERIAL NOT HEMORR HEMANG INC GUIDE ROADMAPPING  12/03/2017  . IR GENERIC HISTORICAL  07/20/2016   IR RADIOLOGIST EVAL & MGMT 07/20/2016 [facility] INTERV RAD  . IR GENERIC HISTORICAL  08/02/2016   IR RADIOLOGIST EVAL & MGMT 08/02/2016 Sandi Mariscal, MD [facility] INTERV RAD  . IR RADIOLOGIST EVAL & MGMT  11/08/2017  . IR RADIOLOGIST EVAL & MGMT  01/01/2018  . IR RADIOLOGIST EVAL &  MGMT  08/27/2019  . IR RADIOLOGIST EVAL & MGMT  09/16/2020  . IR US GUIDE VASC ACCESS RIGHT  12/03/2017  . MEDIASTINOSCOPY N/A 05/01/2013   Procedure: MEDIASTINOSCOPY;  Surgeon: Gaye Pollack, MD;  [facility];  Service: Thoracic;  Laterality: N/A;  . PFTs  04/2013   Minimal obstructive airway disease  . PILONIDAL CYST EXCISION    . PROSTATE BIOPSY N/A 10/09/2012   NEG bx 04/12/18 as well.  Procedure: PROSTATIC URETHRAL BIOPSY--BPH--no evidence of malignancy;  Surgeon: Molli Hazard, MD;  Location: [facility];  Service: Urology;  Laterality: N/A;  PROSTATIC URETHRAL BIOPSY  . PROSTATE BIOPSY  03/2018   NEG  . PTCA  1992  . ROTATOR CUFF REPAIR     Bilateral  . THOROCOTOMY WITH LOBECTOMY Left 05/29/2013   Procedure: LEFT THOROCOTOMY WITH LEFT UPPER LOBE LOBECTOMY;  Surgeon: Gaye Pollack, MD;  Location: [place] OR;  Service: Thoracic;  Laterality: Left;  . TRANSTHORACIC ECHOCARDIOGRAM  07/2015   EF 50-55%, hypokin of inf myoc, grd I DD, mild MR, mod dilat of LA.  Marland Kitchen VIDEO BRONCHOSCOPY N/A  05/01/2013   Procedure: VIDEO BRONCHOSCOPY;  Surgeon: Gaye Pollack, MD;  Location: [facility] OR;  Service: Thoracic;  Laterality: N/A;    Social History:  Social History   Socioeconomic History  . Marital status: Married    Spouse name: Not on file  . Number of children: Not on file  . Years of education: Not on file  . Highest education level: Not on file  Occupational History  . Occupation: Retired Estate manager/land agent  . Smoking status: Current Every Day Smoker    Packs/day: 1.00    Years: 24.00    Pack years: 24.00    Types: Cigarettes    Last attempt to quit: 05/28/2013    Years since quitting: 7.6  . Smokeless tobacco: Never Used  . Tobacco comment: QUIT 15 YEARS AGO-07/03/17 smoking  Vaping Use  . Vaping Use: Never used  Substance and Sexual Activity  . Alcohol use: Yes    Alcohol/week: 15.0 standard drinks    Types: 3 Glasses of wine, 12 Cans of beer per week    Comment: 1-2 beers a day  . Drug use: No  . Sexual activity: Not on file  Other Topics Concern  . Not on file  Social History Narrative   HEART HEALTHY DIET.     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   MARRIED, 2 children who live in [place].  Smoked on and off since then.   ALCOHOL USE -YES- RED WINE SOCIALLY, couple of beers at night usually.   07-08-18 Unable to ask abuse questions wife with him today.   Right handed    Social Determinants of Health   Financial Resource Strain: Not on file  Food Insecurity: Not on file  Transportation Needs: Not on file  Physical Activity: Not on file  Stress: Not on file  Social Connections: Not on file  Intimate Partner Violence: Not on file  The patient is from [place] originally. He enjoys flying planes but has not flown since his seizures. He does still do mechanical work on them.  He and his wife Gwenette Greet live in [place].  Family History: Family History  Problem Relation Age of Onset  . Hypertension Mother   . Prostate cancer Father         Prostate cancer  . Cancer Father        Brain  Tumor  . Lung cancer Sister        NON SMOKER  . Cancer Sister   . Hyperlipidemia Sister   . Hyperlipidemia Brother   . Heart attack Brother   . Hypertension Brother   . Prostate cancer Brother   . Diabetes Neg Hx   . Stroke Neg Hx     ALLERGIES:  is allergic to quinolones, wellbutrin [bupropion], rosuvastatin, and iodinated diagnostic agents.  Meds: Current Outpatient Medications  Medication Sig Dispense Refill  . amLODipine (NORVASC) 5 MG tablet TAKE 1 TABLET BY MOUTH EVERY DAY 90 tablet 1  . atorvastatin (LIPITOR) 20 MG tablet TAKE 1 TABLET BY MOUTH EVERY DAY 90 tablet 1  . budesonide-formoterol (SYMBICORT) 160-4.5 MCG/ACT inhaler INHALE 1-2 PUFFS INTO THE LUNGS EVERY 12 HOURS. GARGLE AND SPIT AFTER USE. 10.2 each 11  . lamoTRIgine (LAMICTAL) 200 MG tablet Take 1 tablet in the morning and 1 and 1/2 tablets in the evening 225 tablet 3  . metoprolol tartrate (LOPRESSOR) 50 MG tablet TAKE 1 TABLET BY MOUTH TWICE A DAY 180 tablet 1  . Multiple Vitamin (MULTIVITAMIN WITH MINERALS) TABS tablet Take 1 tablet by mouth daily.    . NONFORMULARY OR COMPOUNDED ITEM Antifungal solution: Terbinafine 3%, Fluconazole 2%, Tea Tree Oil 5%, Urea 10%, Ibuprofen 2% in DMSO suspension #39mL (Patient not taking: Reported on 10/05/2020) 1 each 3  . predniSONE (DELTASONE) 50 MG tablet Take 50mg  prednisone on 09/09/20 @ 12 midnight, then take 50mg  prednisone on 09/10/20 @ 6am, then take 50mg  prednisone on 09/10/20 @ 12 noon AND take 50mg  Benadryl on 09/10/20 @ 12 noon. 3 tablet 0  . tamsulosin (FLOMAX) 0.4 MG CAPS capsule Take 0.4 mg by mouth daily.     No current facility-administered medications for this encounter.   Facility-Administered Medications Ordered in Other Encounters  Medication Dose Route Frequency Provider Last Rate Last Admin  . diphenhydrAMINE (BENADRYL) capsule 50 mg  50 mg Oral Once Sandi Mariscal, MD        Physical Findings: Unable to  assess due to encounter type.  Lab Findings: Lab Results  Component Value Date   WBC 8.2 07/10/2019   HGB 15.9 07/10/2019   HCT 47.4 07/10/2019   MCV 87.9 07/10/2019   PLT 253 07/10/2019     Radiographic Findings: MR Brain W Wo Contrast  Result Date: 12/31/2020 CLINICAL DATA:  Follow-up metastatic non-small cell lung cancer. Two brain metastases treated with SRS in 2017. EXAM: MRI HEAD WITHOUT AND WITH CONTRAST TECHNIQUE: Multiplanar, multiecho pulse sequences of the brain and surrounding structures were obtained without and with intravenous contrast. CONTRAST:  40mL MULTIHANCE GADOBENATE DIMEGLUMINE 529 MG/ML IV SOLN COMPARISON:  07/02/2020 FINDINGS: Brain: New lesions: None. Larger lesions: None. Stable or smaller lesions: 1. 2.1 x 1.2 cm cystic, peripherally enhancing treated lesion with chronic blood products and mild surrounding T2 hyperintensity in the lateral right frontoparietal region, unchanged (series 11, image 120). 2. 1.7 x 0.5 cm enhancing treated lesion medially at the right frontoparietal vertex with associated chronic blood products and minimal surrounding T2 hyperintensity, unchanged (series 11, image 153 and series 13, image 19). Other brain findings: There is no evidence of an acute infarct, midline shift, or extra-axial fluid collection. Chronic microhemorrhages in both occipital lobes are unchanged. Patchy T2 hyperintensities in the cerebral white matter bilaterally are unchanged and nonspecific but may reflect a combination of chronic small vessel ischemic disease and post treatment changes. Moderate cerebral atrophy is unchanged. Vascular: Major intracranial vascular flow voids are  preserved. Skull and upper cervical spine: No suspicious marrow lesion. Sinuses/Orbits: Bilateral cataract extraction. Mild mucosal thickening in the paranasal sinuses, greatest in the right frontal sinus. Clear mastoid air cells. Other: None. IMPRESSION: 1. Unchanged treated right frontoparietal  metastases. 2. No evidence of new intracranial metastases. Electronically Signed   By: Logan Bores M.D.   On: 12/31/2020 10:07    Impression/Plan: 1. Recurrent Metastatic Stage IIIA, T2a, N2, M0, NSCLC, adenocarcinoma of the left upper lobe. The patien tis clinically and radiographically without disease. He has not seen Dr. Julien Nordmann so I would recommend we get him in for another appointment soon. We discussed the rationale for continued follow-up with imaging of the brain in 6 months with MRI but he prefers delaying MRI until one year from now. He's aware that this is going against guidelines but is motivated to do this, understanding that if he had symptoms he would let us know sooner.   2. Seizure at presentation.  He continues under the care of Dr. Delice Lesch, taking Lamictal. He will follow up with her in 1 year's time. 3. Elevated PSA with BPH.  The patient continues to follow-up with Dr. Lovena Neighbours.    Given current concerns for patient exposure during the COVID-19 pandemic, this encounter was conducted via telephone.  The patient has provided two factor identification and has given verbal consent for this type of encounter and has been advised to only accept a meeting of this type in a secure network environment. The time spent during this encounter was 30 minutes including preparation, discussion, and coordination of the patient's care. The attendants for this meeting include  Hayden Pedro  and Jill Alexanders.  During the encounter, Hayden Pedro was located at [facility] Radiation Oncology Department.  HAYVEN FATIMA was located at home.       Carola Rhine, PAC

## 2021-01-11 ENCOUNTER — Other Ambulatory Visit: Payer: Self-pay | Admitting: Physician Assistant

## 2021-01-11 DIAGNOSIS — C3412 Malignant neoplasm of upper lobe, left bronchus or lung: Secondary | ICD-10-CM

## 2021-01-11 NOTE — Progress Notes (Signed)
I received a message from radiation oncology regarding this patient. He would like to re-establish care with med onc. It appears he was last seen in 2020. The patient had a CT angio performed in January 2022. Therefore, Dr. Julien Nordmann recommends that the patient have a restaging CT scan of the chest in July 2022 then follow up in the office a few days later to review the results. I called the patient to review this information but was unable to reach him. I left a voicemail with the above and if he has questions, then I left our call back instructions/number. I will place the CT order and send a scheduling message.

## 2021-01-12 ENCOUNTER — Telehealth: Payer: Self-pay | Admitting: Internal Medicine

## 2021-01-12 NOTE — Telephone Encounter (Signed)
Scheduled appt per 5/24 sch msg. Called pt, no answer. Left msg with appt date and time.  

## 2021-02-04 ENCOUNTER — Encounter: Payer: Self-pay | Admitting: Family Medicine

## 2021-02-04 MED ORDER — TAMSULOSIN HCL 0.4 MG PO CAPS: 0.4000 mg | ORAL_CAPSULE | Freq: Every day | ORAL | 3 refills | Status: DC

## 2021-02-04 NOTE — Telephone Encounter (Signed)
Please Advise if refill appropriate. Med pending, last seen 03/23/20 and advised to f/u 6 mo.

## 2021-02-05 ENCOUNTER — Other Ambulatory Visit: Payer: Self-pay | Admitting: Family Medicine

## 2021-02-07 DIAGNOSIS — N139 Obstructive and reflux uropathy, unspecified: Secondary | ICD-10-CM | POA: Diagnosis not present

## 2021-02-07 DIAGNOSIS — R3911 Hesitancy of micturition: Secondary | ICD-10-CM | POA: Diagnosis not present

## 2021-02-07 NOTE — Telephone Encounter (Signed)
I already did RF of this med on 02/04/21.

## 2021-02-22 ENCOUNTER — Encounter: Payer: Self-pay | Admitting: Podiatry

## 2021-02-22 ENCOUNTER — Ambulatory Visit: Payer: Medicare Other | Admitting: Podiatry

## 2021-02-22 ENCOUNTER — Other Ambulatory Visit: Payer: Self-pay

## 2021-02-22 DIAGNOSIS — M79674 Pain in right toe(s): Secondary | ICD-10-CM

## 2021-02-22 DIAGNOSIS — B351 Tinea unguium: Secondary | ICD-10-CM | POA: Diagnosis not present

## 2021-02-22 DIAGNOSIS — M79675 Pain in left toe(s): Secondary | ICD-10-CM

## 2021-02-24 NOTE — Progress Notes (Signed)
  Subjective:  Patient ID: Jordan Mccullough, male    DOB: May 24, 1941,  MRN: [id number]  80 y.o. male presents painful thick toenails that are difficult to trim. Pain interferes with ambulation. Aggravating factors include wearing enclosed shoe gear. Pain is relieved with periodic professional debridement.  He states he may need a refill of the compounded topical antifungal solution from [place].  He voices no new pedal concerns on today's visit.  Allergies  Allergen Reactions   Quinolones Other (See Comments)    Pt with aneurism: need to avoid quinoline use b/c of weakening of vascular wall that can be associated with quinolones.   Wellbutrin [Bupropion] Other (See Comments)    AVOID IN PT W/HX OF SEIZURES   Rosuvastatin Other (See Comments)    Myalgias and dark urine   Iodinated Diagnostic Agents Hives    1 hive on lt cheek lasting approximately 1 hour on last 2 CT per pt; needs pre meds in future; 50 mg benadryl po 1 hr prior to exam per Dr. Weber Cooks 07/2014  11/2016 per Tery Sanfilippo, pt needs 13hr premeds per our policy.    Review of Systems: Negative except as noted in the HPI.   Objective:  Vascular Examination: Vascular status intact b/l with palpable pedal pulses. CFT immediate b/l. No edema. No pain with calf compression b/l. Skin temperature gradient WNL b/l. Pedal hair present.  Neurological Examination: Protective sensation intact 5/5 intact bilaterally with 10g monofilament b/l. Vibratory sensation intact b/l.  Dermatological Examination: Pedal skin with normal turgor, texture and tone b/l. Toenails 1-5 b/l thick, discolored, elongated with subungual debris and pain on dorsal palpation. No hyperkeratotic lesions noted b/l.  Musculoskeletal Examination: Muscle strength 5/5 to b/l LE. No gross bony deformities b/l. Normal muscle strength 5/5 to all lower extremity muscle groups bilaterally. No pain crepitus or joint limitation noted with ROM b/l. No gross bony  deformities bilaterally. Patient ambulates independent of any assistive aids.  Radiographs: None Assessment:   1. Pain due to onychomycosis of toenails of both feet    Plan:  -Examined patient. -Patient to continue soft, supportive shoe gear daily. -Toenails 1-5 b/l were debrided in length and girth with sterile nail nippers and dremel, Pinpoint bleeding of right hallux addressed with Lumicain Hemostatic Solution. Cleansed with alcohol and TAO applied. No further treatment required by patient. Advised him to call [facility] for refill on compounded topical antifungal solution. He related understanding. -Patient to report any pedal injuries to medical professional immediately. -Patient/POA to call should there be question/concern in the interim.  Return in about 3 months (around 05/25/2021).  Marzetta Board, DPM

## 2021-02-28 ENCOUNTER — Encounter: Payer: Self-pay | Admitting: Family Medicine

## 2021-03-01 ENCOUNTER — Other Ambulatory Visit: Payer: Self-pay

## 2021-03-01 ENCOUNTER — Encounter: Payer: Self-pay | Admitting: Family Medicine

## 2021-03-01 DIAGNOSIS — C3412 Malignant neoplasm of upper lobe, left bronchus or lung: Secondary | ICD-10-CM

## 2021-03-01 MED ORDER — PREDNISONE 50 MG PO TABS: ORAL_TABLET | ORAL | 0 refills | Status: DC

## 2021-03-01 MED ORDER — DIPHENHYDRAMINE HCL 50 MG PO TABS: ORAL_TABLET | ORAL | 0 refills | Status: DC

## 2021-03-01 NOTE — Telephone Encounter (Signed)
Pt LM stating he has CT scan 7/18 and needs his pre tx medications.  Pt takes Benadryl and prednisone.

## 2021-03-03 ENCOUNTER — Encounter: Payer: Self-pay | Admitting: Family Medicine

## 2021-03-03 ENCOUNTER — Telehealth (INDEPENDENT_AMBULATORY_CARE_PROVIDER_SITE_OTHER): Payer: Medicare Other | Admitting: Family Medicine

## 2021-03-03 ENCOUNTER — Other Ambulatory Visit: Payer: Self-pay | Admitting: Family Medicine

## 2021-03-03 DIAGNOSIS — Z9889 Other specified postprocedural states: Secondary | ICD-10-CM

## 2021-03-03 DIAGNOSIS — Z902 Acquired absence of lung [part of]: Secondary | ICD-10-CM

## 2021-03-03 DIAGNOSIS — J3800 Paralysis of vocal cords and larynx, unspecified: Secondary | ICD-10-CM

## 2021-03-03 DIAGNOSIS — R490 Dysphonia: Secondary | ICD-10-CM

## 2021-03-03 MED ORDER — PANTOPRAZOLE SODIUM 40 MG PO TBEC: 40.0000 mg | DELAYED_RELEASE_TABLET | Freq: Every day | ORAL | 3 refills | Status: DC

## 2021-03-03 NOTE — Progress Notes (Addendum)
Virtual Visit via Video Note  I connected with Jordan Mccullough on 03/03/21 at  1:30 PM EDT by a video enabled telemedicine application and verified that I am speaking with the correct person using two identifiers.  Location patient: home, [place] Location provider:work or home office Persons participating in the virtual visit: patient, provider  I discussed the limitations of evaluation and management by telemedicine and the availability of in person appointments. The patient expressed understanding and agreed to proceed.   HPI: 80 y/o WM being seen today for "throat issues". Actually has prob with hoarseness---chronic, but worse last couple months to the point of being barely able to speak much at all.  Says hx of this prob started after getting lung ca surgery (L upper lobectomy), was told L recurrent laryngeal nerve damage was done and L vocal cord dysfunction/paralysis was present.   Also has noted small palpable nodule in L submandib region that is painless, says it moves with palpation and is not feeling hard at all. He had covid infection 5 wks ago.  Voice started to signif worsen after covid but was worsening prior to this.  Has chronic phlegmy cough, still smoking about 1.5 packs/cigs per day. Recounts a bad episode of GER about 4 wks ago but none at all since then. No sore throat.  No swallowing dysfunction or coughing with eating.  Hx of lung cancer and left upper lobectomy 2014, Jordan Mccullough Is set for surveillance chest CT in 4d via his oncologist, Dr. Earlie Server.  ROS as above, plus--> no fevers, no CP, no SOB, no wheezing,  no dizziness, no HAs, no rashes, no melena/hematochezia.  No polyuria or polydipsia.  No myalgias or arthralgias.  No focal weakness, paresthesias, or tremors.  No acute vision or hearing abnormalities.  No dysuria or unusual/new urinary urgency or frequency.  No recent changes in lower legs. No n/v/d or abd pain.  No palpitations.     Past Medical History:  Diagnosis Date   AAA  (abdominal aortic aneurysm) ([place])    s/p repair (aortoiliac bipass; with persistent endograft leak in inferior mesenteric artery)--stable as of 01/2017 vascular f/u.   Asymptomatic cholelithiasis 07/2015   Incidental finding on PET CT   Back pain 04/19/2016   BPH with elevated PSA    PSA signif rise 11/2017 (5.4 in 2015 to 31.21 November 2017. Bx 03/2018 benign.   Coronary artery disease    MI 1992, S/P  PTCA; negative stress test in November 2011 with no ischemia.    COVID-19 virus infection 12/07/2020   Diverticulosis    Elevated PSA 01/2018   01/22/18:  PSA 26.5.  Prostate MRI with abnormality->subsequent prostate bx BENIGN 04/12/18.PSA 72019 stbl at 24.1->rose to 36 03/2020->rec'd return to urol.   Gross hematuria 05/2018   Occurred 2+ mo's after prostate bx: attributed to bx by urol, treated empirically with keflex.  Urol initially suspected gross hem was sequela of recent prost bx. BUT recurrence of gross hem prompted full hematuria w/u 11/15/18.   Hearing loss    Bilateral    History of radiation therapy 11/10/13-12/12/13   lung,50Gy/51fx   Hyperlipidemia    Crestor= myalgias   Hypertension    Lung cancer ([place]) 05/2013   non-small cell;  L upper lobectomy with mediastinal LN dissection, chemo, and radiation in 2014/2015. Remission until Jordan Mccullough had questionable seizure 07/2015--MRI showed brain mets; palliative brain rad (stereotactic radiation therapy) started 08/25/15.  CT C/A/P clear 02/2016, 05/2016, 11/2016, 12/2018. MRI brain 12/2018 stable lesions/no new mets. Rpt 6  mo per onc.   Malignant neoplasm of upper lobe, left bronchus or lung ([place]) 10/29/2013   *?New spiculated 4.5cm mass in L upper lung field on CXR at [facility] in [place] 07/14/17.  Dr. Julien Nordmann (onc) recommended f/u CXR after abx course.  If mass unchanged then repeat CT chest.  Surveillance CT chest and MRI brain 06/2018 show no sign of dz.   Myocardial infarction [facility]) 1992   Dr Angelena Form     Obstructive uropathy     obstructive and reflux uropathy   Peripheral vascular disease ([place]) 08/2012   4.8x4.6 cm infrarenal abdominal aortic fusiform aneurysm, 1.5 cm right common iliac artery aneurysm.  Type II endoleak from inferior mesenteric artery--Vasc surg referred Jordan Mccullough to interv rad for possible embolization of the leak as of 07/17/16.   Pneumonia 09/06/2018   XRAY was done at [facility].   S/P radiation therapy [facility] 08/25/15   Stereotactic radiation therapy: frontoparietal 18gy,posterior frontal lobe 20gy,   Seizure disorder ([place]) 2016/2017   as sequela of brain mets;  Grand mal seizure 07/2015, then got on keppra and was seizure-free until focal motor seizures of L side of face began 02/2016- these responded well to up-titration of keppra but Jordan Mccullough had adverse side effects so eventually Jordan Mccullough had to be switched over to lamictal 07/2016--stable/seizure free on this med as of 09/2018 neuro f/u.   Shortness of breath    Tobacco dependence    "Quit" 1992, but Jordan Mccullough has smoked "on and off" since that time    Past Surgical History:  Procedure Laterality Date   ABDOMINAL AORTIC ENDOVASCULAR STENT GRAFT N/A 05/07/2014   Procedure: ABDOMINAL AORTIC ENDOVASCULAR STENT GRAFT;  Surgeon: Serafina Mitchell, MD;  Location: [place] [facility];  Service: Vascular;  Laterality: N/A;   Carotid duplex dopplers  07/2015   1-39% on R, no signif dz noted on L   COLONOSCOPY W/ POLYPECTOMY  2003    negative 2010,due 2020; Dr Olevia Perches   CORONARY ANGIOPLASTY     no stents   CYSTOSCOPY/RETROGRADE/URETEROSCOPY Bilateral 10/09/2012   Procedure: BILATERAL RETROGRADE bladder and urethral BIOPSY ;  Surgeon: Molli Hazard, MD;  Location: [facility];  Service: Urology;  Laterality: Bilateral;  BILATERAL RETROGRADE    EEG  08/05/15   Jordan Mccullough placed on keppra just prior to this test due to having ? seizure (MRI showed brain mets)   HERNIA REPAIR Bilateral    Inguinal   INGUINAL HERIIORRHAPHY BILATERALLY     IR ANGIOGRAM SELECTIVE EACH ADDITIONAL VESSEL   12/03/2017   IR ANGIOGRAM VISCERAL SELECTIVE  12/03/2017   IR ANGIOGRAM VISCERAL SELECTIVE  12/03/2017   IR EMBO ARTERIAL NOT HEMORR HEMANG INC GUIDE ROADMAPPING  12/03/2017   IR GENERIC HISTORICAL  07/20/2016   IR RADIOLOGIST EVAL & MGMT 07/20/2016 [facility] INTERV RAD   IR GENERIC HISTORICAL  08/02/2016   IR RADIOLOGIST EVAL & MGMT 08/02/2016 Sandi Mariscal, MD [facility] INTERV RAD   IR RADIOLOGIST EVAL & MGMT  11/08/2017   IR RADIOLOGIST EVAL & MGMT  01/01/2018   IR RADIOLOGIST EVAL & MGMT  08/27/2019   IR RADIOLOGIST EVAL & MGMT  09/16/2020   IR US GUIDE VASC ACCESS RIGHT  12/03/2017   MEDIASTINOSCOPY N/A 05/01/2013   Procedure: MEDIASTINOSCOPY;  Surgeon: Gaye Pollack, MD;  Location: [place] [facility];  Service: Thoracic;  Laterality: N/A;   PFTs  04/2013   Minimal obstructive airway disease   PILONIDAL CYST EXCISION     PROSTATE BIOPSY N/A 10/09/2012  NEG bx 04/12/18 as well.  Procedure: PROSTATIC URETHRAL BIOPSY--BPH--no evidence of malignancy;  Surgeon: Molli Hazard, MD;  Location: [facility];  Service: Urology;  Laterality: N/A;  PROSTATIC URETHRAL BIOPSY   PROSTATE BIOPSY  03/2018   NEG   PTCA  1992   ROTATOR CUFF REPAIR     Bilateral   THOROCOTOMY WITH LOBECTOMY Left 05/29/2013   Procedure: LEFT THOROCOTOMY WITH LEFT UPPER LOBE LOBECTOMY;  Surgeon: Gaye Pollack, MD;  Location: [facility];  Service: Thoracic;  Laterality: Left;   TRANSTHORACIC ECHOCARDIOGRAM  07/2015   EF 50-55%, hypokin of inf myoc, grd I DD, mild MR, mod dilat of LA.   VIDEO BRONCHOSCOPY N/A 05/01/2013   Procedure: VIDEO BRONCHOSCOPY;  Surgeon: Gaye Pollack, MD;  Location: [facility];  Service: Thoracic;  Laterality: N/A;     Current Outpatient Medications:    amLODipine (NORVASC) 5 MG tablet, TAKE 1 TABLET BY MOUTH EVERY DAY, Disp: 90 tablet, Rfl: 1   atorvastatin (LIPITOR) 20 MG tablet, TAKE 1 TABLET BY MOUTH EVERY DAY, Disp: 90 tablet, Rfl: 1   budesonide-formoterol (SYMBICORT) 160-4.5 MCG/ACT inhaler, INHALE 1-2 PUFFS INTO THE LUNGS  EVERY 12 HOURS. GARGLE AND SPIT AFTER USE., Disp: 10.2 each, Rfl: 11   lamoTRIgine (LAMICTAL) 200 MG tablet, Take 1 tablet in the morning and 1 and 1/2 tablets in the evening, Disp: 225 tablet, Rfl: 3   metoprolol tartrate (LOPRESSOR) 50 MG tablet, TAKE 1 TABLET BY MOUTH TWICE A DAY, Disp: 60 tablet, Rfl: 0   Multiple Vitamin (MULTIVITAMIN WITH MINERALS) TABS tablet, Take 1 tablet by mouth daily., Disp: , Rfl:    tamsulosin (FLOMAX) 0.4 MG CAPS capsule, Take 1 capsule (0.4 mg total) by mouth daily., Disp: 90 capsule, Rfl: 3   diphenhydrAMINE (BENADRYL) 50 MG tablet, Take 50mg  of Benadryl along with 50mg  of Prednisone 2 hours before CT scan. (Patient not taking: Reported on 03/03/2021), Disp: 1 tablet, Rfl: 0   NONFORMULARY OR COMPOUNDED ITEM, Antifungal solution: Terbinafine 3%, Fluconazole 2%, Tea Tree Oil 5%, Urea 10%, Ibuprofen 2% in DMSO suspension #7mL (Patient not taking: No sig reported), Disp: 1 each, Rfl: 3   predniSONE (DELTASONE) 50 MG tablet, Take 50mg  prednisone 13 hours, then 50mg  prednisone 7 hours, then 50mg  prednisone along with 50mg  of Benadryl 2 hours before CT Scan. (Patient not taking: Reported on 03/03/2021), Disp: 3 tablet, Rfl: 0  EXAM:  VITALS per patient if applicable:  GENERAL: alert, oriented, appears well and in no acute distress  HEENT: atraumatic, conjunttiva clear, no obvious abnormalities on inspection of external nose and ears  NECK: normal movements of the head and neck  LUNGS: on inspection no signs of respiratory distress, breathing rate appears normal, no obvious gross SOB, gasping or wheezing  CV: no obvious cyanosis  MS: moves all visible extremities without noticeable abnormality  PSYCH/NEURO: pleasant and cooperative, no obvious depression or anxiety, speech and thought processing grossly intact  LABS: none today  Lab Results  Component Value Date   TSH 0.708 08/05/2015   Lab Results  Component Value Date   WBC 8.2 07/10/2019   HGB 15.9  07/10/2019   HCT 47.4 07/10/2019   MCV 87.9 07/10/2019   PLT 253 07/10/2019   Lab Results  Component Value Date   CREATININE 0.90 09/10/2020   BUN 16 03/23/2020   NA 138 03/23/2020   K 4.6 03/23/2020   CL 105 03/23/2020   CO2 29 03/23/2020   Lab Results  Component Value Date  ALT 16 03/23/2020   AST 15 03/23/2020   ALKPHOS 58 03/23/2020   BILITOT 0.4 03/23/2020   Lab Results  Component Value Date   CHOL 130 03/23/2020   Lab Results  Component Value Date   HDL 39.10 03/23/2020   Lab Results  Component Value Date   LDLCALC 64 03/23/2020   Lab Results  Component Value Date   TRIG 138.0 03/23/2020   Lab Results  Component Value Date   CHOLHDL 3 03/23/2020   Lab Results  Component Value Date   PSA 35.75 Repeated and verified X2. (H) 03/23/2020   PSA 24.10 03/07/2018   PSA 24.10 03/07/2018   Lab Results  Component Value Date   HGBA1C 5.7 07/04/2013   ASSESSMENT AND PLAN:  Discussed the following assessment and plan:  Chronic, worsening loss of phonation. Hx L recurrent laryngeal nerve injury/palsy as a result of left upper lobectomy for lung cancer 2014 (thoracotomy with mediastinal LN resection).  Has seen Dr. Redmond Baseman in remote past for this but Jordan Mccullough declined the procedure offered b/c sx's were not that bad at the time.  Will get him back in to see Dr. Beverely Pace referral ordered today. Will start daily pantoprazole to try to treat any contribution from silent LPR. Low suspicion of any recurrence of his lung ca as the cause of sx's, but definitely good he is getting surveillance chest CT in 4d.   I discussed the assessment and treatment plan with the patient. The patient was provided an opportunity to ask questions and all were answered. The patient agreed with the plan and demonstrated an understanding of the instructions.   F/u: as needed  Signed:  Crissie Sickles, MD           03/03/2021

## 2021-03-05 DIAGNOSIS — R531 Weakness: Secondary | ICD-10-CM | POA: Diagnosis not present

## 2021-03-05 DIAGNOSIS — M25519 Pain in unspecified shoulder: Secondary | ICD-10-CM | POA: Diagnosis not present

## 2021-03-05 DIAGNOSIS — S2223XA Sternal manubrial dissociation, initial encounter for closed fracture: Secondary | ICD-10-CM | POA: Diagnosis not present

## 2021-03-05 DIAGNOSIS — F17210 Nicotine dependence, cigarettes, uncomplicated: Secondary | ICD-10-CM | POA: Diagnosis not present

## 2021-03-05 DIAGNOSIS — K5790 Diverticulosis of intestine, part unspecified, without perforation or abscess without bleeding: Secondary | ICD-10-CM | POA: Diagnosis not present

## 2021-03-05 DIAGNOSIS — R55 Syncope and collapse: Secondary | ICD-10-CM | POA: Diagnosis not present

## 2021-03-05 DIAGNOSIS — R221 Localized swelling, mass and lump, neck: Secondary | ICD-10-CM | POA: Diagnosis not present

## 2021-03-05 DIAGNOSIS — I639 Cerebral infarction, unspecified: Secondary | ICD-10-CM | POA: Diagnosis not present

## 2021-03-05 DIAGNOSIS — S2242XA Multiple fractures of ribs, left side, initial encounter for closed fracture: Secondary | ICD-10-CM | POA: Diagnosis not present

## 2021-03-05 DIAGNOSIS — R0902 Hypoxemia: Secondary | ICD-10-CM | POA: Diagnosis not present

## 2021-03-05 DIAGNOSIS — S2232XA Fracture of one rib, left side, initial encounter for closed fracture: Secondary | ICD-10-CM | POA: Diagnosis not present

## 2021-03-05 DIAGNOSIS — G8911 Acute pain due to trauma: Secondary | ICD-10-CM | POA: Diagnosis not present

## 2021-03-05 DIAGNOSIS — S2243XA Multiple fractures of ribs, bilateral, initial encounter for closed fracture: Secondary | ICD-10-CM | POA: Diagnosis not present

## 2021-03-05 DIAGNOSIS — S2249XA Multiple fractures of ribs, unspecified side, initial encounter for closed fracture: Secondary | ICD-10-CM | POA: Diagnosis not present

## 2021-03-05 DIAGNOSIS — Z041 Encounter for examination and observation following transport accident: Secondary | ICD-10-CM | POA: Diagnosis not present

## 2021-03-05 DIAGNOSIS — N3289 Other specified disorders of bladder: Secondary | ICD-10-CM | POA: Diagnosis not present

## 2021-03-05 DIAGNOSIS — K8020 Calculus of gallbladder without cholecystitis without obstruction: Secondary | ICD-10-CM | POA: Diagnosis not present

## 2021-03-07 ENCOUNTER — Ambulatory Visit (HOSPITAL_COMMUNITY): Payer: Medicare Other

## 2021-03-07 ENCOUNTER — Encounter: Payer: Self-pay | Admitting: Internal Medicine

## 2021-03-07 ENCOUNTER — Telehealth: Payer: Self-pay | Admitting: Family Medicine

## 2021-03-07 ENCOUNTER — Telehealth: Payer: Self-pay | Admitting: Physician Assistant

## 2021-03-07 NOTE — Telephone Encounter (Signed)
FYI: Patient's daughter called to inform Dr. Anitra Lauth that over the weekend, he was at [facility] in [place], [place] due [place] accident. Patient sustained broken ribs and closed fracture in sternum. Daughter will bring ROI today for [place] to obtain those records.  She also stated he was sent home from that hospital without pain medications. Hospital followup appt with Dr. Anitra Lauth was offered, but patient and daughter declined at this time. I advised her to call [facility] regarding pain medications.

## 2021-03-07 NOTE — Telephone Encounter (Signed)
I received a message from the CT department that the patient was unable to come to his CT scan today due to a recent accident. He is scheduled for a follow up visit with Dr. Julien Nordmann on 03/14/21. I called the patient and left a voicemail detailing to reschedule his scan for the earliest availability for him. Once he knows the date of his rescheduled scan, he was advised to call his back to schedule his follow up appointment about 2 days later or so. I left our call back number should he have any questions as well as the call back number to radiology.

## 2021-03-07 NOTE — Telephone Encounter (Signed)
Update: Patient's daughter brought ROI for [facility] and I faxed it today. She also made hospital followup appt for patient, Monday 03/14/21. She states patient is resistant to another office visit but she feels she can encourage him to come by then.

## 2021-03-07 NOTE — Telephone Encounter (Signed)
Noted. I'm ok with rx'ing him some pain meds until he sees me next week if he wants.  Ask him how intense his pain is, locations of pain, and if taking any otc pain meds at this time. Keep appt 03/14/21.

## 2021-03-07 NOTE — Telephone Encounter (Signed)
FYI  Please see below

## 2021-03-08 ENCOUNTER — Encounter: Payer: Self-pay | Admitting: Family Medicine

## 2021-03-08 NOTE — Telephone Encounter (Signed)
Pt states he is taking 4 200 mg Tylenol every q6h. Says it does help but his pain is at an 8/10 when not having to cough. When he does cough it can be a 10/10. Pain is located in the chest area going from the left side to the right.

## 2021-03-09 MED ORDER — TRAMADOL HCL 50 MG PO TABS: ORAL_TABLET | ORAL | 0 refills | Status: DC

## 2021-03-09 NOTE — Telephone Encounter (Signed)
Spoke with patient regarding results/recommendations.  

## 2021-03-09 NOTE — Telephone Encounter (Signed)
Not common but pantoprazole could do this. Stop this med and keep plan for appt (in person)

## 2021-03-09 NOTE — Telephone Encounter (Signed)
Ibuprofen ok but I'd limit this to 3 tabs (600mg ) three times a day

## 2021-03-09 NOTE — Telephone Encounter (Signed)
OK, I'll eRx tramadol to take with the tylenol---take as directed on the pill bottle.

## 2021-03-09 NOTE — Telephone Encounter (Signed)
Pt is taking (4) 200 mg ibuprofen instead of tylenol. Still okay to take tramadol with ibuprofen?

## 2021-03-11 ENCOUNTER — Other Ambulatory Visit: Payer: Self-pay | Admitting: Family Medicine

## 2021-03-14 ENCOUNTER — Inpatient Hospital Stay: Payer: Medicare Other | Admitting: Internal Medicine

## 2021-03-14 ENCOUNTER — Other Ambulatory Visit: Payer: Self-pay

## 2021-03-14 ENCOUNTER — Ambulatory Visit (INDEPENDENT_AMBULATORY_CARE_PROVIDER_SITE_OTHER): Payer: Medicare Other | Admitting: Family Medicine

## 2021-03-14 ENCOUNTER — Encounter: Payer: Self-pay | Admitting: Family Medicine

## 2021-03-14 VITALS — BP 148/68 | HR 80 | Temp 97.9°F | Resp 16 | Ht 64.0 in | Wt 139.4 lb

## 2021-03-14 DIAGNOSIS — S2243XD Multiple fractures of ribs, bilateral, subsequent encounter for fracture with routine healing: Secondary | ICD-10-CM | POA: Diagnosis not present

## 2021-03-14 DIAGNOSIS — S2220XD Unspecified fracture of sternum, subsequent encounter for fracture with routine healing: Secondary | ICD-10-CM

## 2021-03-14 DIAGNOSIS — M7989 Other specified soft tissue disorders: Secondary | ICD-10-CM

## 2021-03-14 NOTE — Progress Notes (Signed)
03/14/2021  CC:  Chief Complaint  Patient presents with   Hospitalization Follow-up   Patient is a 80 y.o. Caucasian male who presents accompanied by his wife Gwenette Greet for ED follow up.  Was seen in ED 03/05/21, [facility]). He was in a lawnmower accident--it tipped over on him when mowing on uneven ground.  Patient sustained broken ribs (4 on R and 3 on L) and closed fracture of sternum.   I have reviewed patient's discharge summary plus pertinent specific notes, labs, and imaging from the hospitalization.    Currently:  Pt called a few days ago to report ongoing pain, as expected, and I sent in tramadol rx b/c the hospital rx'd him hydrocodone and it caused too much anxiety.  Says tramadol cuased him to feel the same so he stopped it. Now he says the only pain he really feels is chest wall anteriorly when he sneezes or coughs. Sitting still he doesn't feel much pain.  He takes 1g tylenol q6h and this controls his pain fine. He feels down/frustrated since the accident but was not feeling that way prior.  Both legs swelled some lately, L >>R, a little better now though.  Goes down signif when supine/sleeping. No calf pain. Has baseline mild L LL swelling.   ROS as above, plus--> no fevers,no SOB,  no dizziness, no HAs, no rashes, no melena/hematochezia.  No polyuria or polydipsia.  No myalgias or arthralgias.  No focal weakness, paresthesias, or tremors.  No acute vision or hearing abnormalities.  No dysuria or unusual/new urinary urgency or frequency. No n/v/d or abd pain.  No palpitations.    PMH:  Past Medical History:  Diagnosis Date   AAA (abdominal aortic aneurysm) ([place])    s/p repair (aortoiliac bipass; with persistent endograft leak in inferior mesenteric artery)--stable as of 01/2017 vascular f/u.   Asymptomatic cholelithiasis 07/2015   Incidental finding on PET CT   Back pain 04/19/2016   BPH with elevated PSA    PSA signif rise 11/2017 (5.4 in 2015 to 31.21 November 2017. Bx 03/2018 benign.   Coronary artery disease    MI 1992, S/P  PTCA; negative stress test in November 2011 with no ischemia.    COVID-19 virus infection 12/07/2020   Diverticulosis    Elevated PSA 01/2018   01/22/18:  PSA 26.5.  Prostate MRI with abnormality->subsequent prostate bx BENIGN 04/12/18.PSA 72019 stbl at 24.1->rose to 36 03/2020->rec'd return to urol.   Gross hematuria 05/2018   Occurred 2+ mo's after prostate bx: attributed to bx by urol, treated empirically with keflex.  Urol initially suspected gross hem was sequela of recent prost bx. BUT recurrence of gross hem prompted full hematuria w/u 11/15/18.   Hearing loss    Bilateral    History of radiation therapy 11/10/13-12/12/13   lung,50Gy/57fx   Hyperlipidemia    Crestor= myalgias   Hypertension    Lung cancer ([place]) 05/2013   non-small cell;  L upper lobectomy with mediastinal LN dissection, chemo, and radiation in 2014/2015. Remission until pt had questionable seizure 07/2015--MRI showed brain mets; palliative brain rad (stereotactic radiation therapy) started 08/25/15.  CT C/A/P clear 02/2016, 05/2016, 11/2016, 12/2018. MRI brain 12/2018 stable lesions/no new mets. Rpt 6 mo per onc.   Malignant neoplasm of upper lobe, left bronchus or lung ([place]) 10/29/2013   *?New spiculated 4.5cm mass in L upper lung field on CXR at [facility] in [place] 07/14/17.  Dr. Julien Nordmann (onc) recommended f/u CXR after abx  course.  If mass unchanged then repeat CT chest.  Surveillance CT chest and MRI brain 06/2018 show no sign of dz.   Myocardial infarction [facility]) 1992   Dr Angelena Form     Obstructive uropathy    obstructive and reflux uropathy   Peripheral vascular disease ([place]) 08/2012   4.8x4.6 cm infrarenal abdominal aortic fusiform aneurysm, 1.5 cm right common iliac artery aneurysm.  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   Pneumonia  09/06/2018   XRAY was done at [place] care.   S/P radiation therapy [facility] 08/25/15   Stereotactic radiation therapy: frontoparietal 18gy,posterior frontal lobe 20gy,   Seizure disorder ([place]) 2016/2017   as sequela of brain mets;  Grand mal seizure 07/2015, then got on keppra and was seizure-free until focal motor seizures of L side of face began 02/2016- these responded well to up-titration of keppra but pt had adverse side effects so eventually pt had to be switched over to lamictal 07/2016--stable/seizure free on this med as of 09/2018 neuro f/u.   Shortness of breath    Tobacco dependence    "Quit" 1992, but pt has smoked "on and off" since that time   Unilateral vocal cord paralysis    left, onset s/p mediastinoscopy with mediastinal LN resection 2014.    PSH:  Past Surgical History:  Procedure Laterality Date   ABDOMINAL AORTIC ENDOVASCULAR STENT GRAFT N/A 05/07/2014   Procedure: ABDOMINAL AORTIC ENDOVASCULAR STENT GRAFT;  Surgeon: Serafina Mitchell, MD;  Location: [facility];  Service: Vascular;  Laterality: N/A;   Carotid duplex dopplers  07/2015   1-39% on R, no signif dz noted on L   COLONOSCOPY W/ POLYPECTOMY  2003    negative 2010,due 2020; Dr Olevia Perches   CORONARY ANGIOPLASTY     no stents   CYSTOSCOPY/RETROGRADE/URETEROSCOPY Bilateral 10/09/2012   Procedure: BILATERAL RETROGRADE bladder and urethral BIOPSY ;  Surgeon: Molli Hazard, MD;  Location: [facility];  Service: Urology;  Laterality: Bilateral;  BILATERAL RETROGRADE    EEG  08/05/15   Pt placed on keppra just prior to this test due to having ? seizure (MRI showed brain mets)   HERNIA REPAIR Bilateral    Inguinal   INGUINAL HERIIORRHAPHY BILATERALLY     IR ANGIOGRAM SELECTIVE EACH ADDITIONAL VESSEL  12/03/2017   IR ANGIOGRAM VISCERAL SELECTIVE  12/03/2017   IR ANGIOGRAM VISCERAL SELECTIVE  12/03/2017   IR EMBO ARTERIAL NOT HEMORR HEMANG INC GUIDE ROADMAPPING  12/03/2017   IR GENERIC HISTORICAL  07/20/2016   IR  RADIOLOGIST EVAL & MGMT 07/20/2016 [facility] INTERV RAD   IR GENERIC HISTORICAL  08/02/2016   IR RADIOLOGIST EVAL & MGMT 08/02/2016 Sandi Mariscal, MD [facility] INTERV RAD   IR RADIOLOGIST EVAL & MGMT  11/08/2017   IR RADIOLOGIST EVAL & MGMT  01/01/2018   IR RADIOLOGIST EVAL & MGMT  08/27/2019   IR RADIOLOGIST EVAL & MGMT  09/16/2020   IR US GUIDE VASC ACCESS RIGHT  12/03/2017   MEDIASTINOSCOPY N/A 05/01/2013   Procedure: MEDIASTINOSCOPY;  Surgeon: Gaye Pollack, MD;  Location: [facility];  Service: Thoracic;  Laterality: N/A;   PFTs  04/2013   Minimal obstructive airway disease   PILONIDAL CYST EXCISION     PROSTATE BIOPSY N/A 10/09/2012   NEG bx 04/12/18 as well.  Procedure: PROSTATIC URETHRAL BIOPSY--BPH--no evidence of malignancy;  Surgeon: Molli Hazard, MD;  Location: [facility];  Service: Urology;  Laterality: N/A;  PROSTATIC URETHRAL BIOPSY   PROSTATE BIOPSY  03/2018   NEG   PTCA  1992   ROTATOR CUFF REPAIR     Bilateral   THOROCOTOMY WITH LOBECTOMY Left 05/29/2013   Procedure: LEFT THOROCOTOMY WITH LEFT UPPER LOBE LOBECTOMY;  Surgeon: Gaye Pollack, MD;  Location: [facility];  Service: Thoracic;  Laterality: Left;   TRANSTHORACIC ECHOCARDIOGRAM  07/2015   EF 50-55%, hypokin of inf myoc, grd I DD, mild MR, mod dilat of LA.   VIDEO BRONCHOSCOPY N/A 05/01/2013   Procedure: VIDEO BRONCHOSCOPY;  Surgeon: Gaye Pollack, MD;  Location: [facility];  Service: Thoracic;  Laterality: N/A;    MEDS:  Outpatient Medications Prior to Visit  Medication Sig Dispense Refill   amLODipine (NORVASC) 5 MG tablet TAKE 1 TABLET BY MOUTH EVERY DAY 90 tablet 1   atorvastatin (LIPITOR) 20 MG tablet TAKE 1 TABLET BY MOUTH EVERY DAY 90 tablet 1   budesonide-formoterol (SYMBICORT) 160-4.5 MCG/ACT inhaler INHALE 1-2 PUFFS INTO THE LUNGS EVERY 12 HOURS. GARGLE AND SPIT AFTER USE. 10.2 each 11   lamoTRIgine (LAMICTAL) 200 MG tablet Take 1 tablet in the morning and 1 and 1/2 tablets in the evening 225 tablet 3   metoprolol tartrate  (LOPRESSOR) 50 MG tablet TAKE 1 TABLET BY MOUTH TWICE A DAY 60 tablet 0   Multiple Vitamin (MULTIVITAMIN WITH MINERALS) TABS tablet Take 1 tablet by mouth daily.     NONFORMULARY OR COMPOUNDED ITEM Antifungal solution: Terbinafine 3%, Fluconazole 2%, Tea Tree Oil 5%, Urea 10%, Ibuprofen 2% in DMSO suspension #83mL 1 each 3   pantoprazole (PROTONIX) 40 MG tablet Take 1 tablet (40 mg total) by mouth daily. 30 tablet 3   tamsulosin (FLOMAX) 0.4 MG CAPS capsule Take 1 capsule (0.4 mg total) by mouth daily. 90 capsule 3   traMADol (ULTRAM) 50 MG tablet 1-2 tabs po q6h prn pain 15 tablet 0   diphenhydrAMINE (BENADRYL) 50 MG tablet Take 50mg  of Benadryl along with 50mg  of Prednisone 2 hours before CT scan. (Patient not taking: No sig reported) 1 tablet 0   predniSONE (DELTASONE) 50 MG tablet Take 50mg  prednisone 13 hours, then 50mg  prednisone 7 hours, then 50mg  prednisone along with 50mg  of Benadryl 2 hours before CT Scan. (Patient not taking: No sig reported) 3 tablet 0   No facility-administered medications prior to visit.  EXAM:  Vitals with BMI 03/14/2021 10/05/2020 03/23/2020  Height 5\' 4"  5\' 4"  5\' 3"   Weight 139 lbs 6 oz 142 lbs 10 oz 139 lbs 3 oz  BMI 23.92 78.24 23.53  Systolic 614 431 540  Diastolic 68 81 78  Pulse 80 78 66   Gen: Alert, well appearing.  Patient is oriented to person, place, time, and situation. AFFECT: pleasant, lucid thought and speech. Chest wall with diffuse ecchymoses anteriorly.  Minimal TTP over anterior chest wall, no deformity or crepitus. CV: RRR, no m/r/g.   LUNGS: Bilat soft insp/exp wheeze but good aeration, nonlabored resps.  Able to take deep breaths fine. EXT: no clubbing or cyanosis.  L LL with 3+ ankle and foot pitting edema.  No tenderness.  Homan's neg.  No skin abnormality of legs.   Pertinent labs/imaging   Chemistry      Component Value Date/Time   NA 138 03/23/2020 1136   NA 141 06/18/2017 1006   K 4.6 03/23/2020 1136   K 5.3 (H) 06/18/2017  1006   CL 105 03/23/2020 1136   CO2 29 03/23/2020 1136  CO2 25 06/18/2017 1006   BUN 16 03/23/2020 1136   BUN 14.3 06/18/2017 1006   CREATININE 0.90 09/10/2020 1322   CREATININE 1.04 07/10/2019 1037   CREATININE 0.9 06/18/2017 1006      Component Value Date/Time   CALCIUM 9.7 03/23/2020 1136   CALCIUM 10.3 06/18/2017 1006   ALKPHOS 58 03/23/2020 1136   ALKPHOS 63 06/18/2017 1006   AST 15 03/23/2020 1136   AST 12 (L) 07/10/2019 1037   AST 15 06/18/2017 1006   ALT 16 03/23/2020 1136   ALT 14 07/10/2019 1037   ALT 17 06/18/2017 1006   BILITOT 0.4 03/23/2020 1136   BILITOT 0.3 07/10/2019 1037   BILITOT 0.35 06/18/2017 1006       ASSESSMENT/PLAN:  1) Multiple anterior rib fractures bilat, + sternal fx. Stable, improving pain level.  Cont tylenol 1g tid, ok to try ibup 600 in place of a tylenol dose to see if it helps any better.  Limit ibup to 2 doses a day, though.  He has tramadol 50mg  to use prn severe pain but likely he'll not use this much b/c makes him feel bad. He'll try mucinex DM to see if it helps with chronic cough.  2) L lower leg swelling/edema: hard to tell how much was present at baseline. Will check L LL venous doppler u/s to r/o DVT.  FOLLOW UP:  to be determined based on imaging results.  Signed:  Crissie Sickles, MD           03/14/2021

## 2021-03-16 ENCOUNTER — Ambulatory Visit (HOSPITAL_BASED_OUTPATIENT_CLINIC_OR_DEPARTMENT_OTHER)
Admission: RE | Admit: 2021-03-16 | Discharge: 2021-03-16 | Disposition: A | Discharge status: Home / Self Care | Payer: Medicare Other | Source: Ambulatory Visit | Attending: Family Medicine | Admitting: Family Medicine

## 2021-03-16 ENCOUNTER — Other Ambulatory Visit: Payer: Self-pay

## 2021-03-16 DIAGNOSIS — M7989 Other specified soft tissue disorders: Secondary | ICD-10-CM | POA: Insufficient documentation

## 2021-03-19 ENCOUNTER — Encounter: Payer: Self-pay | Admitting: Family Medicine

## 2021-03-21 DIAGNOSIS — S82141A Displaced bicondylar fracture of right tibia, initial encounter for closed fracture: Secondary | ICD-10-CM

## 2021-03-21 HISTORY — DX: Displaced bicondylar fracture of right tibia, initial encounter for closed fracture: S82.141A

## 2021-03-21 MED ORDER — FLUTICASONE PROPIONATE 0.05 % EX CREA: TOPICAL_CREAM | Freq: Two times a day (BID) | CUTANEOUS | 0 refills | Status: DC

## 2021-03-21 NOTE — Telephone Encounter (Signed)
Please advise, thanks.

## 2021-03-21 NOTE — Telephone Encounter (Signed)
Steroid cream eRx.

## 2021-03-26 ENCOUNTER — Other Ambulatory Visit: Payer: Self-pay | Admitting: Family Medicine

## 2021-03-31 ENCOUNTER — Other Ambulatory Visit: Payer: Self-pay | Admitting: Family Medicine

## 2021-04-06 ENCOUNTER — Telehealth: Payer: Self-pay

## 2021-04-06 ENCOUNTER — Other Ambulatory Visit: Payer: Self-pay | Admitting: Medical Oncology

## 2021-04-06 ENCOUNTER — Telehealth: Payer: Self-pay | Admitting: Family Medicine

## 2021-04-06 DIAGNOSIS — C3412 Malignant neoplasm of upper lobe, left bronchus or lung: Secondary | ICD-10-CM

## 2021-04-06 MED ORDER — PREDNISONE 50 MG PO TABS: ORAL_TABLET | ORAL | 0 refills | Status: DC

## 2021-04-06 MED ORDER — DIPHENHYDRAMINE HCL 50 MG PO TABS: ORAL_TABLET | ORAL | 0 refills | Status: DC

## 2021-04-06 NOTE — Telephone Encounter (Signed)
Left message for patient to schedule Annual Wellness Visit.  Please schedule with Nurse Health Advisor Leroy Kennedy, RN at [facility]. Please call [phone] ask for [facility]

## 2021-04-06 NOTE — Telephone Encounter (Signed)
Rx preparation for CT scan sent.  I have also spoken with the pts wife and advised this rx has been sent. She expressed understanding of this information.

## 2021-04-07 ENCOUNTER — Inpatient Hospital Stay: Payer: Medicare Other | Attending: Internal Medicine

## 2021-04-07 ENCOUNTER — Other Ambulatory Visit: Payer: Self-pay

## 2021-04-07 ENCOUNTER — Telehealth: Payer: Self-pay | Admitting: Internal Medicine

## 2021-04-07 ENCOUNTER — Ambulatory Visit (HOSPITAL_COMMUNITY)
Admission: RE | Admit: 2021-04-07 | Discharge: 2021-04-07 | Disposition: A | Discharge status: Home / Self Care | Payer: Medicare Other | Source: Ambulatory Visit | Attending: Physician Assistant | Admitting: Physician Assistant

## 2021-04-07 DIAGNOSIS — E785 Hyperlipidemia, unspecified: Secondary | ICD-10-CM | POA: Diagnosis not present

## 2021-04-07 DIAGNOSIS — Z923 Personal history of irradiation: Secondary | ICD-10-CM | POA: Insufficient documentation

## 2021-04-07 DIAGNOSIS — I700 Atherosclerosis of aorta: Secondary | ICD-10-CM | POA: Insufficient documentation

## 2021-04-07 DIAGNOSIS — C3490 Malignant neoplasm of unspecified part of unspecified bronchus or lung: Secondary | ICD-10-CM | POA: Diagnosis not present

## 2021-04-07 DIAGNOSIS — I739 Peripheral vascular disease, unspecified: Secondary | ICD-10-CM | POA: Diagnosis not present

## 2021-04-07 DIAGNOSIS — M7989 Other specified soft tissue disorders: Secondary | ICD-10-CM | POA: Diagnosis not present

## 2021-04-07 DIAGNOSIS — N400 Enlarged prostate without lower urinary tract symptoms: Secondary | ICD-10-CM | POA: Diagnosis not present

## 2021-04-07 DIAGNOSIS — G40909 Epilepsy, unspecified, not intractable, without status epilepticus: Secondary | ICD-10-CM | POA: Diagnosis not present

## 2021-04-07 DIAGNOSIS — Z79899 Other long term (current) drug therapy: Secondary | ICD-10-CM | POA: Insufficient documentation

## 2021-04-07 DIAGNOSIS — I252 Old myocardial infarction: Secondary | ICD-10-CM | POA: Insufficient documentation

## 2021-04-07 DIAGNOSIS — I2510 Atherosclerotic heart disease of native coronary artery without angina pectoris: Secondary | ICD-10-CM | POA: Insufficient documentation

## 2021-04-07 DIAGNOSIS — I10 Essential (primary) hypertension: Secondary | ICD-10-CM | POA: Diagnosis not present

## 2021-04-07 DIAGNOSIS — K760 Fatty (change of) liver, not elsewhere classified: Secondary | ICD-10-CM | POA: Insufficient documentation

## 2021-04-07 DIAGNOSIS — C3412 Malignant neoplasm of upper lobe, left bronchus or lung: Secondary | ICD-10-CM | POA: Insufficient documentation

## 2021-04-07 DIAGNOSIS — J479 Bronchiectasis, uncomplicated: Secondary | ICD-10-CM | POA: Diagnosis not present

## 2021-04-07 DIAGNOSIS — Z7952 Long term (current) use of systemic steroids: Secondary | ICD-10-CM | POA: Diagnosis not present

## 2021-04-07 DIAGNOSIS — I313 Pericardial effusion (noninflammatory): Secondary | ICD-10-CM | POA: Insufficient documentation

## 2021-04-07 DIAGNOSIS — C7931 Secondary malignant neoplasm of brain: Secondary | ICD-10-CM | POA: Diagnosis not present

## 2021-04-07 DIAGNOSIS — I714 Abdominal aortic aneurysm, without rupture: Secondary | ICD-10-CM | POA: Diagnosis not present

## 2021-04-07 LAB — CMP (CANCER CENTER ONLY)
ALT: 15 U/L (ref 0–44)
AST: 12 U/L — ABNORMAL LOW (ref 15–41)
Albumin: 3.7 g/dL (ref 3.5–5.0)
Alkaline Phosphatase: 98 U/L (ref 38–126)
Anion gap: 9 (ref 5–15)
BUN: 12 mg/dL (ref 8–23)
CO2: 23 mmol/L (ref 22–32)
Calcium: 9.8 mg/dL (ref 8.9–10.3)
Chloride: 106 mmol/L (ref 98–111)
Creatinine: 0.87 mg/dL (ref 0.61–1.24)
GFR, Estimated: >60 mL/min (ref >60)
Glucose, Bld: 171 mg/dL — ABNORMAL HIGH (ref 70–99)
Potassium: 4.3 mmol/L (ref 3.5–5.1)
Sodium: 138 mmol/L (ref 135–145)
Total Bilirubin: 0.3 mg/dL (ref 0.3–1.2)
Total Protein: 7.4 g/dL (ref 6.5–8.1)

## 2021-04-07 LAB — CBC WITH DIFFERENTIAL (CANCER CENTER ONLY)
Abs Immature Granulocytes: 0.03 10*3/uL (ref 0.00–0.07)
Basophils Absolute: 0 10*3/uL (ref 0.0–0.1)
Basophils Relative: 0 %
Eosinophils Absolute: 0 10*3/uL (ref 0.0–0.5)
Eosinophils Relative: 0 %
HCT: 43.4 % (ref 39.0–52.0)
Hemoglobin: 14.5 g/dL (ref 13.0–17.0)
Immature Granulocytes: 1 %
Lymphocytes Relative: 15 %
Lymphs Abs: 1 10*3/uL (ref 0.7–4.0)
MCH: 29.7 pg (ref 26.0–34.0)
MCHC: 33.4 g/dL (ref 30.0–36.0)
MCV: 88.8 fL (ref 80.0–100.0)
Monocytes Absolute: 0 10*3/uL — ABNORMAL LOW (ref 0.1–1.0)
Monocytes Relative: 1 %
Neutro Abs: 5.3 10*3/uL (ref 1.7–7.7)
Neutrophils Relative %: 83 %
Platelet Count: 230 10*3/uL (ref 150–400)
RBC: 4.89 MIL/uL (ref 4.22–5.81)
RDW: 15.3 % (ref 11.5–15.5)
WBC Count: 6.4 10*3/uL (ref 4.0–10.5)
nRBC: 0 % (ref 0.0–0.2)

## 2021-04-07 MED ORDER — IOHEXOL 350 MG/ML SOLN
65.0000 mL | Freq: Once | INTRAVENOUS | Status: AC | PRN
  Administered 2021-04-07: 65 mL via INTRAVENOUS

## 2021-04-07 NOTE — Telephone Encounter (Signed)
Scheduled appt per 8/17 sch msg. Pt aware.

## 2021-04-08 ENCOUNTER — Telehealth: Payer: Self-pay

## 2021-04-08 NOTE — Telephone Encounter (Signed)
Call report received from [facility] Radiology regarding pts 04/07/21 CT Chest.  They have highlighted the following:  IMPRESSION: Stigmata of trauma to the LEFT and RIGHT chest with numerous fractures of ribs and of the sternum. More fractures than were reportedly present at the time of previous injury evaluation based on review of a clinic note from 03/14/2021. Correlate with any new history of or signs of trauma. No pneumothorax.  I have called them back to acknowledge their call. I have also notified Dr. Alen Blew, pod partner for Dr. Nicanor Alcon. He does not have concerns from and ONC standpoint.

## 2021-04-13 ENCOUNTER — Inpatient Hospital Stay: Payer: Medicare Other | Admitting: Internal Medicine

## 2021-04-13 ENCOUNTER — Other Ambulatory Visit: Payer: Self-pay

## 2021-04-13 VITALS — BP 145/63 | HR 87 | Temp 97.6°F | Resp 20 | Ht 64.0 in | Wt 135.2 lb

## 2021-04-13 DIAGNOSIS — Z923 Personal history of irradiation: Secondary | ICD-10-CM | POA: Diagnosis not present

## 2021-04-13 DIAGNOSIS — I252 Old myocardial infarction: Secondary | ICD-10-CM | POA: Diagnosis not present

## 2021-04-13 DIAGNOSIS — K760 Fatty (change of) liver, not elsewhere classified: Secondary | ICD-10-CM | POA: Diagnosis not present

## 2021-04-13 DIAGNOSIS — E785 Hyperlipidemia, unspecified: Secondary | ICD-10-CM | POA: Diagnosis not present

## 2021-04-13 DIAGNOSIS — C7931 Secondary malignant neoplasm of brain: Secondary | ICD-10-CM | POA: Diagnosis not present

## 2021-04-13 DIAGNOSIS — I714 Abdominal aortic aneurysm, without rupture: Secondary | ICD-10-CM | POA: Diagnosis not present

## 2021-04-13 DIAGNOSIS — Z7952 Long term (current) use of systemic steroids: Secondary | ICD-10-CM | POA: Diagnosis not present

## 2021-04-13 DIAGNOSIS — C3412 Malignant neoplasm of upper lobe, left bronchus or lung: Secondary | ICD-10-CM

## 2021-04-13 DIAGNOSIS — Z79899 Other long term (current) drug therapy: Secondary | ICD-10-CM | POA: Diagnosis not present

## 2021-04-13 DIAGNOSIS — I10 Essential (primary) hypertension: Secondary | ICD-10-CM | POA: Diagnosis not present

## 2021-04-13 DIAGNOSIS — I313 Pericardial effusion (noninflammatory): Secondary | ICD-10-CM | POA: Diagnosis not present

## 2021-04-13 DIAGNOSIS — M7989 Other specified soft tissue disorders: Secondary | ICD-10-CM | POA: Diagnosis not present

## 2021-04-13 DIAGNOSIS — C3490 Malignant neoplasm of unspecified part of unspecified bronchus or lung: Secondary | ICD-10-CM

## 2021-04-13 DIAGNOSIS — I700 Atherosclerosis of aorta: Secondary | ICD-10-CM | POA: Diagnosis not present

## 2021-04-13 DIAGNOSIS — I739 Peripheral vascular disease, unspecified: Secondary | ICD-10-CM | POA: Diagnosis not present

## 2021-04-13 DIAGNOSIS — I2510 Atherosclerotic heart disease of native coronary artery without angina pectoris: Secondary | ICD-10-CM | POA: Diagnosis not present
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
## 2021-04-16 ENCOUNTER — Encounter: Payer: Self-pay | Admitting: Family Medicine

## 2021-04-17 DIAGNOSIS — S8001XA Contusion of right knee, initial encounter: Secondary | ICD-10-CM | POA: Diagnosis not present

## 2021-04-17 DIAGNOSIS — S5001XA Contusion of right elbow, initial encounter: Secondary | ICD-10-CM | POA: Diagnosis not present

## 2021-04-18 NOTE — Telephone Encounter (Signed)
I need to see him first to determine if any imaging is needed and to know what the best referral plan will be.  I can see him tomorrow at 11:30 or Wednesday at 4.

## 2021-04-23 ENCOUNTER — Other Ambulatory Visit: Payer: Self-pay | Admitting: Family Medicine

## 2021-04-27 ENCOUNTER — Encounter: Payer: Self-pay | Admitting: Family Medicine

## 2021-04-28 MED ORDER — TRAZODONE HCL 50 MG PO TABS
ORAL_TABLET | ORAL | 1 refills | Status: DC
Start: 2021-04-28 — End: 2021-08-04

## 2021-04-28 NOTE — Telephone Encounter (Signed)
We'll try trazodone. Pls eRx trazodone 50mg , 1-2 tabs po qhs as needed for insomnia, #60, RF x 1.  Have him make o/v to discuss if this is not helpful.-thx

## 2021-05-09 DIAGNOSIS — S82144A Nondisplaced bicondylar fracture of right tibia, initial encounter for closed fracture: Secondary | ICD-10-CM | POA: Diagnosis not present

## 2021-05-09 DIAGNOSIS — M79661 Pain in right lower leg: Secondary | ICD-10-CM | POA: Diagnosis not present

## 2021-05-13 ENCOUNTER — Encounter: Payer: Self-pay | Admitting: Family Medicine

## 2021-05-21 ENCOUNTER — Other Ambulatory Visit: Payer: Self-pay | Admitting: Family Medicine

## 2021-05-30 ENCOUNTER — Other Ambulatory Visit: Payer: Self-pay | Admitting: Family Medicine

## 2021-05-31 ENCOUNTER — Encounter: Payer: Self-pay | Admitting: Podiatry

## 2021-05-31 ENCOUNTER — Other Ambulatory Visit: Payer: Self-pay

## 2021-05-31 ENCOUNTER — Ambulatory Visit: Payer: Medicare Other | Admitting: Podiatry

## 2021-05-31 DIAGNOSIS — M79675 Pain in left toe(s): Secondary | ICD-10-CM

## 2021-05-31 DIAGNOSIS — B351 Tinea unguium: Secondary | ICD-10-CM

## 2021-05-31 DIAGNOSIS — M79674 Pain in right toe(s): Secondary | ICD-10-CM

## 2021-06-04 NOTE — Progress Notes (Signed)
  Subjective:  Patient ID: Jordan Mccullough, male    DOB: 22-Nov-1940,  MRN: [id number]  Jordan Mccullough presents to clinic today for thick, elongated toenails b/l feet which are tender when wearing enclosed shoe gear. He relates no new pedal problems on today's visit.  He states he is wearing a knee brace due to knee injury.   PCP is McGowen, Adrian Blackwater, MD , and last visit was 04/13/2021.  Allergies  Allergen Reactions   Quinolones Other (See Comments)    Pt with aneurism: need to avoid quinoline use b/c of weakening of vascular wall that can be associated with quinolones.   Wellbutrin [Bupropion] Other (See Comments)    AVOID IN PT W/HX OF SEIZURES   Rosuvastatin Other (See Comments)    Myalgias and dark urine   Iodinated Diagnostic Agents Hives    1 hive on lt cheek lasting approximately 1 hour on last 2 CT per pt; needs pre meds in future; 50 mg benadryl po 1 hr prior to exam per Dr. Weber Cooks 07/2014  11/2016 per Tery Sanfilippo, pt needs 13hr premeds per our policy.    Review of Systems: Negative except as noted in the HPI. Objective:   Constitutional Jordan Mccullough is a pleasant 80 y.o. Caucasian male, WD, WN in NAD. AAO x 3.   Vascular Capillary refill time to digits immediate b/l. Palpable DP pulse(s) b/l lower extremities Palpable PT pulse(s) b/l lower extremities Pedal hair sparse. Lower extremity skin temperature gradient within normal limits. No pain with calf compression b/l. Trace edema noted b/l lower extremities. No cyanosis or clubbing noted.  Neurologic Normal speech. Oriented to person, place, and time. Protective sensation intact 5/5 intact bilaterally with 10g monofilament b/l. Vibratory sensation intact b/l.  Dermatologic Skin warm and supple b/l lower extremities. No open wounds b/l LE. No interdigital macerations b/l lower extremities. Toenails 1-5 b/l elongated, discolored, dystrophic, thickened, crumbly with subungual debris and tenderness to dorsal palpation.  Orthopedic:  Normal muscle strength 5/5 to all lower extremity muscle groups bilaterally. Knee brace in place RLE.Marland Kitchen No pain crepitus or joint limitation noted with ROM b/l lower extremities. No gross bony deformities b/l lower extremities.   Radiographs: None Assessment:   1. Pain due to onychomycosis of toenails of both feet    Plan:  Patient was evaluated and treated and all questions answered. Consent given for treatment as described below: -No new findings. No new orders. -Patient to continue soft, supportive shoe gear daily. -Toenails 1-5 bilaterally debrided in length and girth without iatrogenic bleeding with sterile nail nipper and dremel.  -Patient to report any pedal injuries to medical professional immediately. -Patient/POA to call should there be question/concern in the interim.  Return in about 3 months (around 08/31/2021).  Marzetta Board, DPM

## 2021-06-13 DIAGNOSIS — Z23 Encounter for immunization: Secondary | ICD-10-CM | POA: Diagnosis not present

## 2021-06-18 ENCOUNTER — Other Ambulatory Visit: Payer: Self-pay | Admitting: Family Medicine

## 2021-06-22 ENCOUNTER — Other Ambulatory Visit: Payer: Self-pay | Admitting: Family Medicine

## 2021-07-19 ENCOUNTER — Ambulatory Visit (INDEPENDENT_AMBULATORY_CARE_PROVIDER_SITE_OTHER): Payer: Medicare Other | Admitting: Family Medicine

## 2021-07-19 ENCOUNTER — Encounter: Payer: Self-pay | Admitting: Family Medicine

## 2021-07-19 ENCOUNTER — Telehealth: Payer: Self-pay

## 2021-07-19 ENCOUNTER — Other Ambulatory Visit: Payer: Self-pay

## 2021-07-19 VITALS — BP 131/68 | HR 72 | Temp 97.9°F | Resp 16 | Ht 64.0 in | Wt 139.0 lb

## 2021-07-19 DIAGNOSIS — Z85118 Personal history of other malignant neoplasm of bronchus and lung: Secondary | ICD-10-CM | POA: Diagnosis not present

## 2021-07-19 DIAGNOSIS — R221 Localized swelling, mass and lump, neck: Secondary | ICD-10-CM | POA: Diagnosis not present

## 2021-07-19 DIAGNOSIS — C3412 Malignant neoplasm of upper lobe, left bronchus or lung: Secondary | ICD-10-CM

## 2021-07-19 LAB — CBC WITH DIFFERENTIAL/PLATELET
Basophils Absolute: 0.1 10*3/uL (ref 0.0–0.1)
Basophils Relative: 0.6 % (ref 0.0–3.0)
Eosinophils Absolute: 0.2 10*3/uL (ref 0.0–0.7)
Eosinophils Relative: 2.2 % (ref 0.0–5.0)
HCT: 42.7 % (ref 39.0–52.0)
Hemoglobin: 14.4 g/dL (ref 13.0–17.0)
Lymphocytes Relative: 18.2 % (ref 12.0–46.0)
Lymphs Abs: 1.8 10*3/uL (ref 0.7–4.0)
MCHC: 33.8 g/dL (ref 30.0–36.0)
MCV: 87.2 fl (ref 78.0–100.0)
Monocytes Absolute: 1 10*3/uL (ref 0.1–1.0)
Monocytes Relative: 9.8 % (ref 3.0–12.0)
Neutro Abs: 6.9 10*3/uL (ref 1.4–7.7)
Neutrophils Relative %: 69.2 % (ref 43.0–77.0)
Platelets: 250 10*3/uL (ref 150.0–400.0)
RBC: 4.9 Mil/uL (ref 4.22–5.81)
RDW: 15.7 % — ABNORMAL HIGH (ref 11.5–15.5)
WBC: 10 10*3/uL (ref 4.0–10.5)

## 2021-07-19 LAB — COMPREHENSIVE METABOLIC PANEL
ALT: 13 U/L (ref 0–53)
AST: 13 U/L (ref 0–37)
Albumin: 4.1 g/dL (ref 3.5–5.2)
Alkaline Phosphatase: 70 U/L (ref 39–117)
BUN: 17 mg/dL (ref 6–23)
CO2: 25 mEq/L (ref 19–32)
Calcium: 9.5 mg/dL (ref 8.4–10.5)
Chloride: 105 mEq/L (ref 96–112)
Creatinine, Ser: 1.01 mg/dL (ref 0.40–1.50)
GFR: 70.75 mL/min (ref >60.00)
Glucose, Bld: 103 mg/dL — ABNORMAL HIGH (ref 70–99)
Potassium: 4.2 mEq/L (ref 3.5–5.1)
Sodium: 137 mEq/L (ref 135–145)
Total Bilirubin: 0.3 mg/dL (ref 0.2–1.2)
Total Protein: 7.3 g/dL (ref 6.0–8.3)

## 2021-07-19 LAB — C-REACTIVE PROTEIN: CRP: <1 mg/dL (ref 0.5–20.0)

## 2021-07-19 LAB — SEDIMENTATION RATE: Sed Rate: 29 mm/h — ABNORMAL HIGH (ref 0–20)

## 2021-07-19 MED ORDER — PREDNISONE 50 MG PO TABS: ORAL_TABLET | ORAL | 0 refills | Status: DC

## 2021-07-19 NOTE — Telephone Encounter (Signed)
Spoke with radiology staff regarding CT procotol due to pt's allergies. Pt will need to have prednisone 50mg  po 13/7/1 prior and benadryl 50mg  po 1 hour prior. They will be contacting him today to schedule

## 2021-07-19 NOTE — Progress Notes (Signed)
OFFICE VISIT  07/19/2021  CC:  Chief Complaint  Patient presents with   Lump in neck    Located on left side; first noticed about 1 month ago and has gotten worse. Pt c/o neck swelling   HPI:    Patient is a 80 y.o. male who presents for "lump in neck"  INTERIM HX: Valente began to notice a painless swelling on left side of neck a couple of months ago, it has gradually grown.  No overlying skin changes.  He has had no fevers, malaise, night sweats, or weight loss. No preceding upper respiratory illness or teeth issues. He does have a history of lung cancer about 7 years ago.  Past Medical History:  Diagnosis Date   AAA (abdominal aortic aneurysm)    s/p repair (aortoiliac bipass; with persistent endograft leak in inferior mesenteric artery)--stable as of 01/2017 vascular f/u.   Asymptomatic cholelithiasis 07/2015   Incidental finding on PET CT   Back pain 04/19/2016   BPH with elevated PSA    PSA signif rise 11/2017 (5.4 in 2015 to 31.21 November 2017. Bx 03/2018 benign.   Coronary artery disease    MI 1992, S/P  PTCA; negative stress test in November 2011 with no ischemia.    COVID-19 virus infection 12/07/2020   Diverticulosis    Elevated PSA 01/2018   01/22/18:  PSA 26.5.  Prostate MRI with abnormality->subsequent prostate bx BENIGN 04/12/18.PSA 72019 stbl at 24.1->rose to 36 03/2020->rec'd return to urol.   Gross hematuria 05/2018   Occurred 2+ mo's after prostate bx: attributed to bx by urol, treated empirically with keflex.  Urol initially suspected gross hem was sequela of recent prost bx. BUT recurrence of gross hem prompted full hematuria w/u 11/15/18.   Hearing loss    Bilateral    History of radiation therapy 11/10/13-12/12/13   lung,50Gy/74f   Hyperlipidemia    Crestor= myalgias   Hypertension    [place]   non-small cell;  L upper lobectomy with mediastinal LN dissection, chemo, and radiation in 2014/2015. Remission until pt had questionable seizure  07/2015--MRI showed brain mets; palliative brain rad (stereotactic radiation therapy) started 08/25/15.  CT C/A/P clear 02/2016, 05/2016, 11/2016, 12/2018. MRI brain 12/2018 stable lesions/no new mets. Rpt 6 mo per onc.   Malignant neoplasm of upper lobe, left bronchus or lung ([place] 10/29/2013   *?New spiculated 4.5cm [facility] L [facility]  Dr. MJulien Nordmann(onc) recommended f/u CXR after abx course.  If mass unchanged then repeat CT chest.  Surveillance CT chest and MRI brain 06/2018 show no sign of dz.   Myocardial infarction ([facility] 1992   Dr MAngelena Form    Obstructive uropathy    obstructive and reflux uropathy   Peripheral vascular disease ([place] 08/[place]   4.8x4.6 cm infrarenal abdominal aortic fusiform aneurysm, 1.5 cm right common iliac artery aneurysm.  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   Pneumonia 09/06/2018   [facility]   [facility]   Stereotactic radiation therapy: frontoparietal 18gy,posterior frontal lobe 20gy,   Seizure disorder ([place] 2016/2017   as sequela of brain mets;  Grand mal seizure 07/2015, then got on keppra and was seizure-free until focal motor seizures of L side of face began 02/2016- these responded well to up-titration of keppra but pt  had adverse side effects so eventually pt had to be switched over to lamictal 07/2016--stable/seizure free on this med as of 09/2018 neuro f/u.   Shortness of breath    Tibial plateau fracture, right 03/2021   Tobacco dependence    "Quit" 1992, but pt has smoked "on and off" since that time   Unilateral vocal cord paralysis    left, onset s/p mediastinoscopy with mediastinal LN resection 2014.    Past Surgical History:  Procedure Laterality Date   ABDOMINAL AORTIC ENDOVASCULAR STENT GRAFT N/A 05/07/2014   Procedure: ABDOMINAL AORTIC  ENDOVASCULAR STENT GRAFT;  Surgeon: Serafina Mitchell, MD;  Location: [place] [facility];  Service: Vascular;  Laterality: N/A;   Carotid duplex dopplers  07/2015   1-39% on R, no signif dz noted on L   COLONOSCOPY W/ POLYPECTOMY  2003    negative 2010,due 2020; Dr Olevia Perches   CORONARY ANGIOPLASTY     no stents   CYSTOSCOPY/RETROGRADE/URETEROSCOPY Bilateral 10/09/2012   Procedure: BILATERAL RETROGRADE bladder and urethral BIOPSY ;  Surgeon: Molli Hazard, MD;  Location: [facility];  Service: Urology;  Laterality: Bilateral;  BILATERAL RETROGRADE    EEG  08/05/15   Pt placed on keppra just prior to this test due to having ? seizure (MRI showed brain mets)   HERNIA REPAIR Bilateral    Inguinal   INGUINAL HERIIORRHAPHY BILATERALLY     IR ANGIOGRAM SELECTIVE EACH ADDITIONAL VESSEL  12/03/2017   IR ANGIOGRAM VISCERAL SELECTIVE  12/03/2017   IR ANGIOGRAM VISCERAL SELECTIVE  12/03/2017   IR EMBO ARTERIAL NOT HEMORR HEMANG INC GUIDE ROADMAPPING  12/03/2017   IR GENERIC HISTORICAL  07/20/2016   IR RADIOLOGIST EVAL & MGMT 07/20/2016 [facility] INTERV RAD   IR GENERIC HISTORICAL  08/02/2016   IR RADIOLOGIST EVAL & MGMT 08/02/2016 Sandi Mariscal, MD [facility] INTERV RAD   IR RADIOLOGIST EVAL & MGMT  11/08/2017   IR RADIOLOGIST EVAL & MGMT  01/01/2018   IR RADIOLOGIST EVAL & MGMT  08/27/2019   IR RADIOLOGIST EVAL & MGMT  09/16/2020   IR US GUIDE VASC ACCESS RIGHT  12/03/2017   MEDIASTINOSCOPY N/A 05/01/2013   Procedure: MEDIASTINOSCOPY;  Surgeon: Gaye Pollack, MD;  Location: [place] OR;  Service: Thoracic;  Laterality: N/A;   PFTs  04/2013   Minimal obstructive airway disease   PILONIDAL CYST EXCISION     PROSTATE BIOPSY N/A 10/09/2012   NEG bx 04/12/18 as well.  Procedure: PROSTATIC URETHRAL BIOPSY--BPH--no evidence of malignancy;  Surgeon: Molli Hazard, MD;  Location: [facility];  Service: Urology;  Laterality: N/A;  PROSTATIC URETHRAL BIOPSY   PROSTATE BIOPSY  03/2018   NEG   PTCA  1992   ROTATOR CUFF REPAIR     Bilateral    THOROCOTOMY WITH LOBECTOMY Left 05/29/2013   Procedure: LEFT THOROCOTOMY WITH LEFT UPPER LOBE LOBECTOMY;  Surgeon: Gaye Pollack, MD;  Location: [facility];  Service: Thoracic;  Laterality: Left;   TRANSTHORACIC ECHOCARDIOGRAM  07/2015   EF 50-55%, hypokin of inf myoc, grd I DD, mild MR, mod dilat of LA.   VIDEO BRONCHOSCOPY N/A 05/01/2013   Procedure: VIDEO BRONCHOSCOPY;  Surgeon: Gaye Pollack, MD;  Location: [facility];  Service: Thoracic;  Laterality: N/A;    Outpatient Medications Prior to Visit  Medication Sig Dispense Refill   amLODipine (NORVASC) 5 MG tablet TAKE 1 TABLET BY MOUTH EVERY DAY 90 tablet 1   atorvastatin (LIPITOR) 20 MG tablet TAKE 1 TABLET BY MOUTH EVERY DAY 90 tablet 1  budesonide-formoterol (SYMBICORT) 160-4.5 MCG/ACT inhaler INHALE 1-2 PUFFS INTO THE LUNGS EVERY 12 HOURS. GARGLE AND SPIT AFTER USE. 10.2 each 11   diphenhydrAMINE (BENADRYL) 50 MG tablet Take 73m of Benadryl along with 562mof Prednisone 2 hours before CT scan. 1 tablet 0   lamoTRIgine (LAMICTAL) 200 MG tablet Take 1 tablet in the morning and 1 and 1/2 tablets in the evening 225 tablet 3   metoprolol tartrate (LOPRESSOR) 50 MG tablet TAKE 1 TABLET BY MOUTH TWICE A DAY 180 tablet 0   Multiple Vitamin (MULTIVITAMIN WITH MINERALS) TABS tablet Take 1 tablet by mouth daily.     NONFORMULARY OR COMPOUNDED ITEM Antifungal solution: Terbinafine 3%, Fluconazole 2%, Tea Tree Oil 5%, Urea 10%, Ibuprofen 2% in DMSO suspension #3035m each 3   pantoprazole (PROTONIX) 40 MG tablet TAKE 1 TABLET BY MOUTH EVERY DAY 90 tablet 0   predniSONE (DELTASONE) 50 MG tablet Take 74m55mednisone 13 hours, then 74mg38mdnisone 7 hours, then 74mg 72mnisone along with 74mg o56mnadryl 2 hours before CT Scan. 3 tablet 0   traZODone (DESYREL) 50 MG tablet Take 1-2 tablets by mouth at bedtime as needed for insomnia. 60 tablet 1   fluticasone (CUTIVATE) 0.05 % cream Apply topically 2 (two) times daily. 30 g 0   tamsulosin (FLOMAX) 0.4 MG CAPS  capsule Take 1 capsule (0.4 mg total) by mouth daily. 90 capsule 3   traMADol (ULTRAM) 50 MG tablet 1-2 tabs po q6h prn pain 15 tablet 0   No facility-administered medications prior to visit.    Allergies  Allergen Reactions   Quinolones Other (See Comments)    Pt with aneurism: need to avoid quinoline use b/c of weakening of vascular wall that can be associated with quinolones.   Wellbutrin [Bupropion] Other (See Comments)    AVOID IN PT W/HX OF SEIZURES   Rosuvastatin Other (See Comments)    Myalgias and dark urine   Iodinated Diagnostic Agents Hives    1 hive on lt cheek lasting approximately 1 hour on last 2 CT per pt; needs pre meds in future; 50 mg benadryl po 1 hr prior to exam per Dr. EntrikiWeber Cooks5  11/2016 per MansellTery Sanfilippoeds 13hr premeds per our policy.    ROS As per HPI  PE: Vitals with BMI 07/19/2021 04/13/2021 03/14/2021  Height 5' 4"  5' 4"  5' 4"   Weight 139 lbs 135 lbs 3 oz 139 lbs 6 oz  BMI 23.85 23.2 2346.2 70.35lic 131 [id number] 68 63 68  Pulse 72 87 80     General: Alert and well-appearing.  Affect is pleasant and thought and speech are lucid. On left side of neck abutting the medial aspect of sternocleidomastoid muscle there is a mass measuring 4 cm x 4 cm.  It is firm and immobile.  Remainder of neck without any palpable lymph nodes or mass.  No tenderness.  The mass is nonpulsatile.  LABS:    Chemistry      Component Value Date/Time   NA 138 04/07/2021 1132   NA 141 06/18/2017 1006   K 4.3 04/07/2021 1132   K 5.3 (H) 06/18/2017 1006   CL 106 04/07/2021 1132   CO2 23 04/07/2021 1132   CO2 25 06/18/2017 1006   BUN 12 04/07/2021 1132   BUN 14.3 06/18/2017 1006   CREATININE 0.87 04/07/2021 1132   CREATININE 0.9 06/18/2017 1006      Component Value Date/Time   CALCIUM 9.8 04/07/2021 1132  CALCIUM 10.3 06/18/2017 1006   ALKPHOS 98 04/07/2021 1132   ALKPHOS 63 06/18/2017 1006   AST 12 (L) 04/07/2021 1132   AST 15 06/18/2017 1006    ALT 15 04/07/2021 1132   ALT 17 06/18/2017 1006   BILITOT 0.3 04/07/2021 1132   BILITOT 0.35 06/18/2017 1006     Lab Results  Component Value Date   WBC 6.4 04/07/2021   HGB 14.5 04/07/2021   HCT 43.4 04/07/2021   MCV 88.8 04/07/2021   PLT 230 04/07/2021   Lab Results  Component Value Date   PSA 35.75 Repeated and verified X2. (H) 03/23/2020   PSA 24.10 03/07/2018   PSA 24.10 03/07/2018   IMPRESSION AND PLAN:  #1 painless neck mass.  Features worrisome for malignancy, specifically metastatic lung cancer given his history.  CT scan soft tissue neck with contrast ordered.  History of mild allergy (one hive) to iodinated contrast.  He did well in the past with preprocedural Benadryl so we will do this again. CBC, c-Met, CRP, and sed rate ordered today.  An After Visit Summary was printed and given to the patient.  FOLLOW UP: Return for f/u to be determined based on results of work-up for neck mass.  Signed:  Crissie Sickles, MD           07/19/2021

## 2021-07-19 NOTE — Telephone Encounter (Signed)
Medications sent by PCP

## 2021-07-19 NOTE — Addendum Note (Signed)
Addended by: Tammi Sou on: 07/19/2021 02:01 PM   Modules accepted: Orders

## 2021-07-21 DIAGNOSIS — J387 Other diseases of larynx: Secondary | ICD-10-CM

## 2021-07-21 HISTORY — DX: Other diseases of larynx: J38.7

## 2021-07-22 ENCOUNTER — Encounter (HOSPITAL_BASED_OUTPATIENT_CLINIC_OR_DEPARTMENT_OTHER): Payer: Self-pay

## 2021-07-22 ENCOUNTER — Ambulatory Visit (HOSPITAL_BASED_OUTPATIENT_CLINIC_OR_DEPARTMENT_OTHER)
Admission: RE | Admit: 2021-07-22 | Discharge: 2021-07-22 | Disposition: A | Discharge status: Home / Self Care | Payer: Medicare Other | Source: Ambulatory Visit | Attending: Family Medicine | Admitting: Family Medicine

## 2021-07-22 ENCOUNTER — Other Ambulatory Visit: Payer: Self-pay

## 2021-07-22 DIAGNOSIS — J387 Other diseases of larynx: Secondary | ICD-10-CM | POA: Diagnosis not present

## 2021-07-22 DIAGNOSIS — Z85118 Personal history of other malignant neoplasm of bronchus and lung: Secondary | ICD-10-CM | POA: Insufficient documentation

## 2021-07-22 DIAGNOSIS — J392 Other diseases of pharynx: Secondary | ICD-10-CM | POA: Diagnosis not present

## 2021-07-22 DIAGNOSIS — R490 Dysphonia: Secondary | ICD-10-CM | POA: Diagnosis not present

## 2021-07-22 DIAGNOSIS — R590 Localized enlarged lymph nodes: Secondary | ICD-10-CM | POA: Diagnosis not present

## 2021-07-22 DIAGNOSIS — R221 Localized swelling, mass and lump, neck: Secondary | ICD-10-CM | POA: Insufficient documentation

## 2021-07-22 MED ORDER — IOHEXOL 300 MG/ML  SOLN
75.0000 mL | Freq: Once | INTRAMUSCULAR | Status: AC | PRN
  Administered 2021-07-22: 75 mL via INTRAVENOUS

## 2021-07-24 ENCOUNTER — Other Ambulatory Visit: Payer: Self-pay | Admitting: Family Medicine

## 2021-07-25 ENCOUNTER — Telehealth: Payer: Self-pay

## 2021-07-25 ENCOUNTER — Encounter: Payer: Self-pay | Admitting: Family Medicine

## 2021-07-25 ENCOUNTER — Other Ambulatory Visit: Payer: Self-pay | Admitting: Family Medicine

## 2021-07-25 DIAGNOSIS — J387 Other diseases of larynx: Secondary | ICD-10-CM

## 2021-07-25 DIAGNOSIS — R590 Localized enlarged lymph nodes: Secondary | ICD-10-CM

## 2021-07-25 NOTE — Telephone Encounter (Signed)
Noted  

## 2021-07-25 NOTE — Telephone Encounter (Signed)
Stacy with [facility] called regarding pt's recent CT Neck impression: anterior larynx mass and necrotic left jugular chain adenopathy most consistent with squamous cell carcinoma. Recommend ENT referral. Provider was verbally made aware and will follow up with pt.

## 2021-07-25 NOTE — Telephone Encounter (Signed)
Noted. I will contact pt today to discuss.

## 2021-07-29 DIAGNOSIS — C329 Malignant neoplasm of larynx, unspecified: Secondary | ICD-10-CM | POA: Insufficient documentation

## 2021-07-29 DIAGNOSIS — C760 Malignant neoplasm of head, face and neck: Secondary | ICD-10-CM | POA: Diagnosis not present

## 2021-07-29 DIAGNOSIS — J343 Hypertrophy of nasal turbinates: Secondary | ICD-10-CM | POA: Diagnosis not present

## 2021-07-29 DIAGNOSIS — J3801 Paralysis of vocal cords and larynx, unilateral: Secondary | ICD-10-CM | POA: Diagnosis not present

## 2021-07-29 DIAGNOSIS — F17210 Nicotine dependence, cigarettes, uncomplicated: Secondary | ICD-10-CM | POA: Diagnosis not present

## 2021-07-29 DIAGNOSIS — C96Z Other specified malignant neoplasms of lymphoid, hematopoietic and related tissue: Secondary | ICD-10-CM | POA: Diagnosis not present

## 2021-08-01 ENCOUNTER — Other Ambulatory Visit: Payer: Self-pay | Admitting: Otolaryngology

## 2021-08-02 ENCOUNTER — Encounter: Payer: Self-pay | Admitting: Family Medicine

## 2021-08-06 ENCOUNTER — Encounter (HOSPITAL_COMMUNITY): Payer: Self-pay | Admitting: Otolaryngology

## 2021-08-06 NOTE — Progress Notes (Signed)
PCP - [place] Cardiologist - na EKG - DOS Chest x-ray - na ECHO - 08/05/15 Cardiac Cath - 1992 CPAP - na   Aspirin Instructions: 08/05/21   COVID TEST- na  Anesthesia review: yes: medical history  -------------  SDW INSTRUCTIONS:  Your procedure is scheduled on 08/09/21. Please report to [facility] Main Entrance "A" at 0530 A.M., and check in at the Admitting office. Call this number if you have problems the morning of surgery: [phone]   Remember: Do not eat or drink after midnight the night before your surgery     Medications to take morning of surgery with a sip of water include: Amlodipine, lipitor,symbicort,lamictal,metoprolol  As of today, STOP taking any Aspirin (unless otherwise instructed by your surgeon), Aleve, Naproxen, Ibuprofen, Motrin, Advil, Goody's, BC's, all herbal medications, fish oil, and all vitamins.    The Morning of Surgery Do not wear jewelry, make-up or nail polish. Do not wear lotions, powders, or perfumes/colognes, or deodorant Do not shave 48 hours prior to surgery.   Men may shave face and neck. Do not bring valuables to the hospital. [facility] is not responsible for any belongings or valuables.  If you are a smoker, DO NOT Smoke 24 hours prior to surgery  If you wear a CPAP at night please bring your mask the morning of surgery   Remember that you must have someone to transport you home after your surgery, and remain with you for 24 hours if you are discharged the same day.  Please bring cases for contacts, glasses, hearing aids, dentures or bridgework because it cannot be worn into surgery.   Patients discharged the day of surgery will not be allowed to drive home.   Please shower the NIGHT BEFORE/MORNING OF SURGERY (use antibacterial soap like DIAL soap if possible). Wear comfortable clothes the morning of surgery. Oral Hygiene is also important to reduce your risk of infection.  Remember - BRUSH YOUR  TEETH THE MORNING OF SURGERY WITH YOUR REGULAR TOOTHPASTE  Patient denies shortness of breath, fever, cough and chest pain.
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
## 2021-08-09 ENCOUNTER — Ambulatory Visit (HOSPITAL_COMMUNITY): Payer: Medicare Other | Admitting: Vascular Surgery

## 2021-08-09 ENCOUNTER — Encounter (HOSPITAL_COMMUNITY)
Admission: RE | Disposition: A | Discharge status: Home / Self Care | Payer: Self-pay | Source: Home / Self Care | Attending: Otolaryngology

## 2021-08-09 ENCOUNTER — Other Ambulatory Visit: Payer: Self-pay

## 2021-08-09 ENCOUNTER — Ambulatory Visit (HOSPITAL_COMMUNITY)
Admission: RE | Admit: 2021-08-09 | Discharge: 2021-08-09 | Disposition: A | Discharge status: Home / Self Care | Payer: Medicare Other | Attending: Otolaryngology | Admitting: Otolaryngology

## 2021-08-09 ENCOUNTER — Encounter (HOSPITAL_COMMUNITY): Payer: Self-pay | Admitting: Otolaryngology

## 2021-08-09 DIAGNOSIS — C329 Malignant neoplasm of larynx, unspecified: Secondary | ICD-10-CM | POA: Diagnosis not present

## 2021-08-09 DIAGNOSIS — F17210 Nicotine dependence, cigarettes, uncomplicated: Secondary | ICD-10-CM | POA: Insufficient documentation

## 2021-08-09 DIAGNOSIS — J3800 Paralysis of vocal cords and larynx, unspecified: Secondary | ICD-10-CM | POA: Diagnosis not present

## 2021-08-09 DIAGNOSIS — E785 Hyperlipidemia, unspecified: Secondary | ICD-10-CM | POA: Diagnosis not present

## 2021-08-09 DIAGNOSIS — I252 Old myocardial infarction: Secondary | ICD-10-CM | POA: Diagnosis not present

## 2021-08-09 DIAGNOSIS — C320 Malignant neoplasm of glottis: Secondary | ICD-10-CM | POA: Diagnosis not present

## 2021-08-09 DIAGNOSIS — R490 Dysphonia: Secondary | ICD-10-CM | POA: Insufficient documentation

## 2021-08-09 DIAGNOSIS — C7989 Secondary malignant neoplasm of other specified sites: Secondary | ICD-10-CM | POA: Diagnosis not present

## 2021-08-09 HISTORY — PX: LARYNGOSCOPY AND ESOPHAGOSCOPY: SHX5660

## 2021-08-09 LAB — COMPREHENSIVE METABOLIC PANEL
ALT: 13 U/L (ref 0–44)
AST: 13 U/L — ABNORMAL LOW (ref 15–41)
Albumin: 3.1 g/dL — ABNORMAL LOW (ref 3.5–5.0)
Alkaline Phosphatase: 65 U/L (ref 38–126)
Anion gap: 7 (ref 5–15)
BUN: 14 mg/dL (ref 8–23)
CO2: 27 mmol/L (ref 22–32)
Calcium: 9.3 mg/dL (ref 8.9–10.3)
Chloride: 105 mmol/L (ref 98–111)
Creatinine, Ser: 1.02 mg/dL (ref 0.61–1.24)
GFR, Estimated: >60 mL/min (ref >60)
Glucose, Bld: 104 mg/dL — ABNORMAL HIGH (ref 70–99)
Potassium: 4 mmol/L (ref 3.5–5.1)
Sodium: 139 mmol/L (ref 135–145)
Total Bilirubin: 0.6 mg/dL (ref 0.3–1.2)
Total Protein: 6.7 g/dL (ref 6.5–8.1)

## 2021-08-09 LAB — CBC
HCT: 43 % (ref 39.0–52.0)
Hemoglobin: 14.3 g/dL (ref 13.0–17.0)
MCH: 29.6 pg (ref 26.0–34.0)
MCHC: 33.3 g/dL (ref 30.0–36.0)
MCV: 89 fL (ref 80.0–100.0)
Platelets: 251 10*3/uL (ref 150–400)
RBC: 4.83 MIL/uL (ref 4.22–5.81)
RDW: 15.3 % (ref 11.5–15.5)
WBC: 11.8 10*3/uL — ABNORMAL HIGH (ref 4.0–10.5)
nRBC: 0 % (ref 0.0–0.2)

## 2021-08-09 SURGERY — LARYNGOSCOPY, WITH ESOPHAGOSCOPY
Anesthesia: General | Site: Mouth

## 2021-08-09 MED ORDER — PROPOFOL 10 MG/ML IV BOLUS
INTRAVENOUS | Status: AC
  Filled 2021-08-09: qty 20

## 2021-08-09 MED ORDER — PROPOFOL 10 MG/ML IV BOLUS
INTRAVENOUS | Status: DC | PRN
  Administered 2021-08-09: 120 mg via INTRAVENOUS

## 2021-08-09 MED ORDER — ONDANSETRON HCL 4 MG/2ML IJ SOLN
INTRAMUSCULAR | Status: DC | PRN
  Administered 2021-08-09: 4 mg via INTRAVENOUS

## 2021-08-09 MED ORDER — LACTATED RINGERS IV SOLN: INTRAVENOUS | Status: DC

## 2021-08-09 MED ORDER — FENTANYL CITRATE (PF) 100 MCG/2ML IJ SOLN
INTRAMUSCULAR | Status: DC | PRN
  Administered 2021-08-09 (×2): 50 ug via INTRAVENOUS

## 2021-08-09 MED ORDER — SUGAMMADEX SODIUM 200 MG/2ML IV SOLN
INTRAVENOUS | Status: DC | PRN
  Administered 2021-08-09: 200 mg via INTRAVENOUS

## 2021-08-09 MED ORDER — ORAL CARE MOUTH RINSE: 15.0000 mL | Freq: Once | OROMUCOSAL | Status: AC

## 2021-08-09 MED ORDER — FENTANYL CITRATE (PF) 250 MCG/5ML IJ SOLN
INTRAMUSCULAR | Status: AC
  Filled 2021-08-09: qty 5

## 2021-08-09 MED ORDER — LIDOCAINE 2% (20 MG/ML) 5 ML SYRINGE
INTRAMUSCULAR | Status: DC | PRN
  Administered 2021-08-09: 100 mg via INTRAVENOUS

## 2021-08-09 MED ORDER — CHLORHEXIDINE GLUCONATE 0.12 % MT SOLN
15.0000 mL | Freq: Once | OROMUCOSAL | Status: AC
  Administered 2021-08-09: 07:00:00 15 mL via OROMUCOSAL
  Filled 2021-08-09: qty 15

## 2021-08-09 MED ORDER — ROCURONIUM BROMIDE 10 MG/ML (PF) SYRINGE
PREFILLED_SYRINGE | INTRAVENOUS | Status: DC | PRN
  Administered 2021-08-09: 40 mg via INTRAVENOUS

## 2021-08-09 MED ORDER — EPINEPHRINE HCL (NASAL) 0.1 % NA SOLN
NASAL | Status: DC | PRN
  Administered 2021-08-09: 30 mL via NASAL

## 2021-08-09 SURGICAL SUPPLY — 30 items
BAG COUNTER SPONGE SURGICOUNT (BAG) ×2 IMPLANT
BAG SURGICOUNT SPONGE COUNTING (BAG) ×1
BALLN PULM 15 16.5 18 X 75CM (BALLOONS)
BALLN PULM 15 16.5 18X75 (BALLOONS)
BALLOON PULM 15 16.5 18X75 (BALLOONS) IMPLANT
CANISTER SUCT 3000ML PPV (MISCELLANEOUS) ×3 IMPLANT
CNTNR URN SCR LID CUP LEK RST (MISCELLANEOUS) IMPLANT
CONT SPEC 4OZ STRL OR WHT (MISCELLANEOUS)
COVER BACK TABLE 60X90IN (DRAPES) ×3 IMPLANT
COVER MAYO STAND STRL (DRAPES) ×3 IMPLANT
DRAPE HALF SHEET 40X57 (DRAPES) ×3 IMPLANT
GLOVE SURG ENC MOIS LTX SZ7.5 (GLOVE) ×3 IMPLANT
GOWN STRL REUS W/ TWL LRG LVL3 (GOWN DISPOSABLE) IMPLANT
GOWN STRL REUS W/TWL LRG LVL3 (GOWN DISPOSABLE) ×2
GUARD TEETH (MISCELLANEOUS) ×3 IMPLANT
KIT TURNOVER KIT B (KITS) ×3 IMPLANT
NDL HYPO 25GX1X1/2 BEV (NEEDLE) IMPLANT
NDL TRANS ORAL INJECTION (NEEDLE) IMPLANT
NEEDLE HYPO 25GX1X1/2 BEV (NEEDLE) IMPLANT
NEEDLE TRANS ORAL INJECTION (NEEDLE) IMPLANT
NS IRRIG 1000ML POUR BTL (IV SOLUTION) ×3 IMPLANT
PAD ARMBOARD 7.5X6 YLW CONV (MISCELLANEOUS) ×6 IMPLANT
PATTIES SURGICAL .5 X3 (DISPOSABLE) ×2 IMPLANT
POSITIONER HEAD DONUT 9IN (MISCELLANEOUS) ×2 IMPLANT
SOL ANTI FOG 6CC (MISCELLANEOUS) IMPLANT
SOLUTION ANTI FOG 6CC (MISCELLANEOUS) ×2
SURGILUBE 2OZ TUBE FLIPTOP (MISCELLANEOUS) IMPLANT
TOWEL GREEN STERILE FF (TOWEL DISPOSABLE) ×6 IMPLANT
TUBE CONNECTING 12'X1/4 (SUCTIONS) ×1
TUBE CONNECTING 12X1/4 (SUCTIONS) ×2 IMPLANT

## 2021-08-09 NOTE — Brief Op Note (Signed)
08/09/2021  8:02 AM  PATIENT:  Jordan Mccullough  81 y.o. male  PRE-OPERATIVE DIAGNOSIS:  Laryngeal cancer; Dysphonia  POST-OPERATIVE DIAGNOSIS:  Laryngeal cancer; Dysphonia  PROCEDURE:  Procedure(s): DIRECT LARYNGOSCOPY WITH BIOPSY AND ESOPHAGOSCOPY (N/A)  SURGEON:  Surgeon(s) and Role:    Melida Quitter, MD - Primary  PHYSICIAN ASSISTANT:   ASSISTANTS: none   ANESTHESIA:   general  EBL:  Minimal   BLOOD ADMINISTERED:none  DRAINS: none   LOCAL MEDICATIONS USED:  NONE  SPECIMEN:  Source of Specimen:  Anterior commissure mass  DISPOSITION OF SPECIMEN:  PATHOLOGY  COUNTS:  YES  TOURNIQUET:  * No tourniquets in log *  DICTATION: .Note written in EPIC  PLAN OF CARE: Discharge to home after PACU  PATIENT DISPOSITION:  PACU - hemodynamically stable.   Delay start of Pharmacological VTE agent (>24hrs) due to surgical blood loss or risk of bleeding: no

## 2021-08-09 NOTE — Op Note (Signed)
Preop diagnosis: Laryngeal cancer, dysphonia Postop diagnosis: same Procedure: Micro direct laryngoscopy with biopsy, rigid esophagoscopy Surgeon: Redmond Baseman Anesth: General endotracheal anesthesia Compl: None Indications: The patient is a 80 year old male with a history of left neck mass and dysphonia found to have a mass at the anterior commissure.  FNA of the left neck mass is positive for squamous carcinoma.  He presents for surgical evaluation and biopsy. Findings: Rounded mass at anterior commissure, just left of midline and just subglottic.  Otherwise, normal exam. Description:  After discussing risks, benefits, and alternatives, the patient was brought to the operating room and placed on the operative table in the supine position.  Anesthesia was induced and the patient was intubated by the anesthesia team without difficulty.  The eyes were taped closed and the bed was turned 90 degrees from anesthesia.  A tooth guard was placed over the upper teeth.  A Dedo laryngoscope was inserted and used to view the various areas of the pharynx and larynx including the endolarynx, post-cricoid area, pyriform sinuses, and vallecula.  Findings are noted above.  The laryngoscope was then placed in suspension and a telescope was used to photograph the larynx.  Biopsies were taken from the anterior commissure mass.  After completion, the laryngoscope was removed and a rigid esophagoscope was inserted and passed down the esophagus keeping the lumen in view down to the distal esophagus.  The scope was then slowly backed out while evaluating the esophagus.  After completion, the esophagoscope was removed.  The patient was then turned back to anesthesia for wake up and was extubated and moved to the recovery room in stable condition.

## 2021-08-09 NOTE — Anesthesia Procedure Notes (Signed)
Procedure Name: Intubation Date/Time: 08/09/2021 7:48 AM Performed by: Georgia Duff, CRNA Pre-anesthesia Checklist: Patient identified, Emergency Drugs available, Suction available and Patient being monitored Patient Re-evaluated:Patient Re-evaluated prior to induction Oxygen Delivery Method: Circle System Utilized Preoxygenation: Pre-oxygenation with 100% oxygen Induction Type: IV induction Ventilation: Mask ventilation without difficulty Laryngoscope Size: Miller and 3 Grade View: Grade I Tube type: MLT Tube size: 6.0 mm Number of attempts: 1 Airway Equipment and Method: Stylet and Oral airway Placement Confirmation: ETT inserted through vocal cords under direct vision, positive ETCO2 and breath sounds checked- equal and bilateral Secured at: 22 cm Tube secured with: Tape Dental Injury: Teeth and Oropharynx as per pre-operative assessment

## 2021-08-09 NOTE — Anesthesia Postprocedure Evaluation (Signed)
Anesthesia Post Note  Patient: Jordan Mccullough  Procedure(s) Performed: MICRO DIRECT LARYNGOSCOPY WITH BIOPSY AND ESOPHAGOSCOPY (Mouth)     Patient location during evaluation: PACU Anesthesia Type: General Level of consciousness: awake and alert Pain management: pain level controlled Vital Signs Assessment: post-procedure vital signs reviewed and stable Respiratory status: spontaneous breathing, nonlabored ventilation, respiratory function stable and patient connected to nasal cannula oxygen Cardiovascular status: blood pressure returned to baseline and stable Postop Assessment: no apparent nausea or vomiting Anesthetic complications: no   No notable events documented.  Last Vitals:  Vitals:   08/09/21 0845 08/09/21 0900  BP: (!) 88/57 (!) 101/57  Pulse: 61 60  Resp: 18 18  Temp:  (!) 36.1 C  SpO2: 94% 96%    Last Pain:  Vitals:   08/09/21 0845  TempSrc:   PainSc: 0-No pain                 Abygale Karpf

## 2021-08-09 NOTE — H&P (Signed)
Jordan Mccullough is an 80 y.o. male.   Chief Complaint: Larynx cancer HPI: 80 year old male with several weeks of a mass in the left neck and worsened dysphonia.  FNA was positive for squamous carcinoma.  A mass was visualized in the larynx by flexible laryngoscopy.  Past Medical History:  Diagnosis Date   AAA (abdominal aortic aneurysm)    s/p repair (aortoiliac bipass; with persistent endograft leak in inferior mesenteric artery)--stable as of 01/2017 vascular f/u.   Asymptomatic cholelithiasis 07/2015   Incidental finding on PET CT   Back pain 04/19/2016   BPH with elevated PSA    PSA signif rise 11/2017 (5.4 in 2015 to 31.21 November 2017. Bx 03/2018 benign.   Coronary artery disease    MI 1992, S/P  PTCA; negative stress test in November 2011 with no ischemia.    COVID-19 virus infection 12/07/2020   Diverticulosis    Elevated PSA 01/2018   01/22/18:  PSA 26.5.  Prostate MRI with abnormality->subsequent prostate bx BENIGN 04/12/18.PSA 72019 stbl at 24.1->rose to 36 03/2020->rec'd return to urol.   Gross hematuria 05/2018   Occurred 2+ mo's after prostate bx: attributed to bx by urol, treated empirically with keflex.  Urol initially suspected gross hem was sequela of recent prost bx. BUT recurrence of gross hem prompted full hematuria w/u 11/15/18.   Hearing loss    Bilateral    Hepatic steatosis    History of radiation therapy 11/10/13-12/12/13   lung,50Gy/56fx   Hyperlipidemia    Crestor= myalgias   Hypertension    Laryngeal mass 07/2021   with L cervical lymphadenopathy.  ENT->bx 07/29/21   Lung cancer ([place]) 05/2013   non-small cell;  L upper lobectomy with mediastinal LN dissection, chemo, and radiation in 2014/2015. Remission until pt had questionable seizure 07/2015--MRI showed brain mets; palliative brain rad (stereotactic radiation therapy) started 08/25/15.  CT C/A/P clear 02/2016, 05/2016, 11/2016, 12/2018. MRI brain 12/2018 stable lesions/no new mets. Rpt 6 mo per onc.   Malignant neoplasm  of upper lobe, left bronchus or lung ([place]) 10/29/2013   *?New spiculated 4.5cm mass in L upper lung field on CXR at [facility] in [place] 07/14/17.  Dr. Julien Nordmann (onc) recommended f/u CXR after abx course.  If mass unchanged then repeat CT chest.  Surveillance CT chest and MRI brain 06/2018 show no sign of dz.   Myocardial infarction [facility]) 1992   Dr Angelena Form     Obstructive uropathy    obstructive and reflux uropathy   Peripheral vascular disease ([place]) 08/2012   4.8x4.6 cm infrarenal abdominal aortic fusiform aneurysm, 1.5 cm right common iliac artery aneurysm.  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   Pneumonia 09/06/2018   XRAY was done at [place] [facility].   S/P radiation therapy [facility] 08/25/15   Stereotactic radiation therapy: frontoparietal 18gy,posterior frontal lobe 20gy,   Seizure disorder ([place]) 2016/2017   as sequela of brain mets;  Grand mal seizure 07/2015, then got on keppra and was seizure-free until focal motor seizures of L side of face began 02/2016- these responded well to up-titration of keppra but pt had adverse side effects so eventually pt had to be switched over to lamictal 07/2016--stable/seizure free on this med as of 09/2018 neuro f/u.   Tibial plateau fracture, right 03/2021   Tobacco dependence    "Quit" 1992, but pt has smoked "on and off" since that  time   Unilateral vocal cord paralysis    left, onset s/p mediastinoscopy with mediastinal LN resection 2014.    Past Surgical History:  Procedure Laterality Date   ABDOMINAL AORTIC ENDOVASCULAR STENT GRAFT N/A 05/07/2014   Procedure: ABDOMINAL AORTIC ENDOVASCULAR STENT GRAFT;  Surgeon: Serafina Mitchell, MD;  Location: [facility];  Service: Vascular;  Laterality: N/A;   Carotid duplex dopplers  07/2015   1-39% on R, no signif dz noted on L   COLONOSCOPY W/ POLYPECTOMY  2003    negative 2010,due 2020; Dr Olevia Perches    CORONARY ANGIOPLASTY     no stents   CYSTOSCOPY/RETROGRADE/URETEROSCOPY Bilateral 10/09/2012   Procedure: BILATERAL RETROGRADE bladder and urethral BIOPSY ;  Surgeon: Molli Hazard, MD;  Location: [facility];  Service: Urology;  Laterality: Bilateral;  BILATERAL RETROGRADE    EEG  08/05/15   Pt placed on keppra just prior to this test due to having ? seizure (MRI showed brain mets)   HERNIA REPAIR Bilateral    Inguinal   INGUINAL HERIIORRHAPHY BILATERALLY     IR ANGIOGRAM SELECTIVE EACH ADDITIONAL VESSEL  12/03/2017   IR ANGIOGRAM VISCERAL SELECTIVE  12/03/2017   IR ANGIOGRAM VISCERAL SELECTIVE  12/03/2017   IR EMBO ARTERIAL NOT HEMORR HEMANG INC GUIDE ROADMAPPING  12/03/2017   IR GENERIC HISTORICAL  07/20/2016   IR RADIOLOGIST EVAL & MGMT 07/20/2016 [facility]   IR GENERIC HISTORICAL  08/02/2016   IR RADIOLOGIST EVAL & MGMT 08/02/2016 Sandi Mariscal, MD [facility]   IR RADIOLOGIST EVAL & MGMT  11/08/2017   IR RADIOLOGIST EVAL & MGMT  01/01/2018   IR RADIOLOGIST EVAL & MGMT  08/27/2019   IR RADIOLOGIST EVAL & MGMT  09/16/2020   IR US GUIDE VASC ACCESS RIGHT  12/03/2017   MEDIASTINOSCOPY N/A 05/01/2013   Procedure: MEDIASTINOSCOPY;  Surgeon: Gaye Pollack, MD;  Location: [facility];  Service: Thoracic;  Laterality: N/A;   PFTs  04/2013   Minimal obstructive airway disease   PILONIDAL CYST EXCISION     PROSTATE BIOPSY N/A 10/09/2012   NEG bx 04/12/18 as well.  Procedure: PROSTATIC URETHRAL BIOPSY--BPH--no evidence of malignancy;  Surgeon: Molli Hazard, MD;  Location: [facility];  Service: Urology;  Laterality: N/A;  PROSTATIC URETHRAL BIOPSY   PROSTATE BIOPSY  03/2018   NEG   PTCA  1992   ROTATOR CUFF REPAIR     Bilateral   THOROCOTOMY WITH LOBECTOMY Left 05/29/2013   Procedure: LEFT THOROCOTOMY WITH LEFT UPPER LOBE LOBECTOMY;  Surgeon: Gaye Pollack, MD;  Location: [facility];  Service: Thoracic;  Laterality: Left;   TRANSTHORACIC ECHOCARDIOGRAM  07/2015   EF 50-55%, hypokin of inf  myoc, grd I DD, mild MR, mod dilat of LA.   VIDEO BRONCHOSCOPY N/A 05/01/2013   Procedure: VIDEO BRONCHOSCOPY;  Surgeon: Gaye Pollack, MD;  Location: [facility];  Service: Thoracic;  Laterality: N/A;    Family History  Problem Relation Age of Onset   Hypertension Mother    Prostate cancer Father        Prostate cancer   Cancer Father        Brain Tumor   Lung cancer Sister        NON SMOKER   Cancer Sister    Hyperlipidemia Sister    Hyperlipidemia Brother    Heart attack Brother    Hypertension Brother    Prostate cancer Brother    Diabetes Neg Hx    Stroke Neg Hx  Social History:  reports that he has been smoking cigarettes. He has a 24.00 pack-year smoking history. He has never used smokeless tobacco. He reports current alcohol use of about 15.0 standard drinks per week. He reports that he does not use drugs.  Allergies:  Allergies  Allergen Reactions   Quinolones Other (See Comments)    Pt with aneurism: need to avoid quinoline use b/c of weakening of vascular wall that can be associated with quinolones.   Wellbutrin [Bupropion] Other (See Comments)    AVOID IN PT W/HX OF SEIZURES   Rosuvastatin Other (See Comments)    Myalgias and dark urine   Iodinated Diagnostic Agents Hives    1 hive on lt cheek lasting approximately 1 hour on last 2 CT per pt; needs pre meds in future; 50 mg benadryl po 1 hr prior to exam per Dr. Weber Cooks 07/2014  11/2016 per Tery Sanfilippo, pt needs 13hr premeds per our policy.    Medications Prior to Admission  Medication Sig Dispense Refill   amLODipine (NORVASC) 5 MG tablet TAKE 1 TABLET BY MOUTH EVERY DAY 90 tablet 1   aspirin 81 MG chewable tablet Chew 81 mg by mouth daily.     atorvastatin (LIPITOR) 20 MG tablet TAKE 1 TABLET BY MOUTH EVERY DAY 90 tablet 1   budesonide-formoterol (SYMBICORT) 160-4.5 MCG/ACT inhaler INHALE 1-2 PUFFS INTO THE LUNGS EVERY 12 HOURS. GARGLE AND SPIT AFTER USE. 10.2 each 11   diphenhydrAMINE (BENADRYL) 50 MG tablet Take  50mg  of Benadryl along with 50mg  of Prednisone 2 hours before CT scan. 1 tablet 0   lamoTRIgine (LAMICTAL) 200 MG tablet Take 1 tablet in the morning and 1 and 1/2 tablets in the evening 225 tablet 3   metoprolol tartrate (LOPRESSOR) 50 MG tablet TAKE 1 TABLET BY MOUTH TWICE A DAY 180 tablet 0   Multiple Vitamin (MULTIVITAMIN WITH MINERALS) TABS tablet Take 1 tablet by mouth daily.     pantoprazole (PROTONIX) 40 MG tablet TAKE 1 TABLET BY MOUTH EVERY DAY 90 tablet 0   NONFORMULARY OR COMPOUNDED ITEM Antifungal solution: Terbinafine 3%, Fluconazole 2%, Tea Tree Oil 5%, Urea 10%, Ibuprofen 2% in DMSO suspension #49mL (Patient not taking: Reported on 08/04/2021) 1 each 3   predniSONE (DELTASONE) 50 MG tablet Take 50mg  prednisone 13 hours, then 50mg  prednisone 7 hours, then 50mg  prednisone along with 50mg  of Benadryl 2 hours before CT Scan. 3 tablet 0    Results for orders placed or performed during the hospital encounter of 08/09/21 (from the past 48 hour(s))  CBC per protocol     Status: Abnormal   Collection Time: 08/09/21  6:20 AM  Result Value Ref Range   WBC 11.8 (H) 4.0 - 10.5 K/uL   RBC 4.83 4.22 - 5.81 MIL/uL   Hemoglobin 14.3 13.0 - 17.0 g/dL   HCT 43.0 39.0 - 52.0 %   MCV 89.0 80.0 - 100.0 fL   MCH 29.6 26.0 - 34.0 pg   MCHC 33.3 30.0 - 36.0 g/dL   RDW 15.3 11.5 - 15.5 %   Platelets 251 150 - 400 K/uL   nRBC 0.0 0.0 - 0.2 %    Comment: Performed at [facility], [place] [address], [place], [place] [address]   *Note: Due to a large number of results and/or encounters for the requested time period, some results have not been displayed. A complete set of results can be found in Results Review.   No results found.  Review of Systems  All  other systems reviewed and are negative.  Blood pressure (!) 144/87, pulse 77, temperature 98 F (36.7 C), temperature source Oral, resp. rate 17, height 5\' 2"  (1.575 m), weight 62.6 kg, SpO2 98 %. Physical Exam Constitutional:       Appearance: Normal appearance. He is normal weight.  HENT:     Head: Normocephalic and atraumatic.     Right Ear: External ear normal.     Left Ear: External ear normal.     Nose: Nose normal.     Mouth/Throat:     Mouth: Mucous membranes are moist.     Pharynx: Oropharynx is clear.  Eyes:     Extraocular Movements: Extraocular movements intact.     Conjunctiva/sclera: Conjunctivae normal.     Pupils: Pupils are equal, round, and reactive to light.  Cardiovascular:     Rate and Rhythm: Normal rate.  Pulmonary:     Effort: Pulmonary effort is normal.  Musculoskeletal:     Cervical back: Normal range of motion.  Skin:    General: Skin is warm and dry.  Neurological:     General: No focal deficit present.     Mental Status: He is alert and oriented to person, place, and time.  Psychiatric:        Mood and Affect: Mood normal.        Behavior: Behavior normal.        Thought Content: Thought content normal.        Judgment: Judgment normal.     Assessment/Plan Laryngeal cancer  To OR for direct laryngoscopy with biopsy and esophagoscopy.  Melida Quitter, MD 08/09/2021, 7:22 AM

## 2021-08-09 NOTE — Transfer of Care (Signed)
Immediate Anesthesia Transfer of Care Note  Patient: Jordan Mccullough  Procedure(s) Performed: MICRO DIRECT LARYNGOSCOPY WITH BIOPSY AND ESOPHAGOSCOPY (Mouth)  Patient Location: PACU  Anesthesia Type:General  Level of Consciousness: awake, alert  and oriented  Airway & Oxygen Therapy: Patient Spontanous Breathing and Patient connected to nasal cannula oxygen  Post-op Assessment: Report given to RN and Post -op Vital signs reviewed and stable  Post vital signs: Reviewed and stable  Last Vitals:  Vitals Value Taken Time  BP 98/50 08/09/21 0816  Temp 36.1 C 08/09/21 0815  Pulse 62 08/09/21 0818  Resp 14 08/09/21 0818  SpO2 96 % 08/09/21 0818  Vitals shown include unvalidated device data.  Last Pain:  Vitals:   08/09/21 0640  TempSrc:   PainSc: 0-No pain      Patients Stated Pain Goal: 3 (17/35/67 0141)  Complications: No notable events documented.

## 2021-08-10 ENCOUNTER — Encounter (HOSPITAL_COMMUNITY): Payer: Self-pay | Admitting: Otolaryngology

## 2021-08-10 LAB — SURGICAL PATHOLOGY

## 2021-08-12 ENCOUNTER — Other Ambulatory Visit: Payer: Self-pay

## 2021-08-12 ENCOUNTER — Telehealth: Payer: Self-pay | Admitting: *Deleted

## 2021-08-12 DIAGNOSIS — C329 Malignant neoplasm of larynx, unspecified: Secondary | ICD-10-CM

## 2021-08-12 NOTE — Telephone Encounter (Signed)
CALLED PATIENT TO INFORM OF PET SCAN FOR 08-26-21- ARRIVAL TIME- 11:30 AM @ [facility], PATIENT TO HAVE WATER ONLY- 6 HRS. PRIOR TO TEST, SPOKE WITH PATIENT AND HE IS AWARE OF THIS TEST

## 2021-08-18 ENCOUNTER — Other Ambulatory Visit: Payer: Self-pay

## 2021-08-18 ENCOUNTER — Telehealth: Payer: Self-pay | Admitting: Hematology and Oncology

## 2021-08-18 DIAGNOSIS — C760 Malignant neoplasm of head, face and neck: Secondary | ICD-10-CM

## 2021-08-18 NOTE — Telephone Encounter (Signed)
Scheduled appt per 12/29 referral. Pt is aware of appt date and time.

## 2021-08-18 NOTE — Progress Notes (Signed)
Oncology Nurse Navigator Documentation   Placed introductory call to new referral patient Jordan Mccullough Introduced myself as the H&N oncology nurse navigator that works with Dr. Isidore Moos to whom he has been referred by Dr. Redmond Baseman.  He confirmed understanding of referral. Briefly explained my role as his navigator, provided my contact information.  Confirmed understanding of upcoming appts and [place] location, explained arrival and registration process. I explained the purpose of a dental evaluation prior to starting RT. He is aware that he is scheduled to see Dr. Benson Norway on 08/23/21 for dental evaluation.  I encouraged him to call with questions/concerns as he moves forward with appts and procedures.   He verbalized understanding of information provided, expressed appreciation for my call.   Navigator Initial Assessment Employment Status: he is retired Currently on [employer] / STD:no Living Situation:he lives with his wife.  Support System:wife, family PCP:  PCD: Museum/gallery curator Needs: no Engineer, maintenance (IT) Needed:  no Ambulation Needs: no DME Used in Home: no Psychosocial Needs:  no Concerns/Needs Understanding Cancer:  addressed/answered by navigator to best of ability Self-Expressed Needs: no   Clinical biochemist, BSN, OCN Head & Neck Oncology Nurse [place] at [facility] Phone # [phone]  Fax # [phone]

## 2021-08-19 ENCOUNTER — Other Ambulatory Visit: Payer: Self-pay | Admitting: Radiation Therapy

## 2021-08-19 DIAGNOSIS — C7931 Secondary malignant neoplasm of brain: Secondary | ICD-10-CM

## 2021-08-21 ENCOUNTER — Other Ambulatory Visit: Payer: Self-pay | Admitting: Family Medicine

## 2021-08-23 ENCOUNTER — Telehealth: Payer: Self-pay | Admitting: Family Medicine

## 2021-08-23 ENCOUNTER — Encounter (HOSPITAL_COMMUNITY): Payer: Self-pay | Admitting: Dentistry

## 2021-08-23 ENCOUNTER — Other Ambulatory Visit: Payer: Self-pay | Admitting: Family Medicine

## 2021-08-23 ENCOUNTER — Other Ambulatory Visit: Payer: Self-pay

## 2021-08-23 ENCOUNTER — Telehealth: Payer: Self-pay | Admitting: Internal Medicine

## 2021-08-23 ENCOUNTER — Ambulatory Visit (INDEPENDENT_AMBULATORY_CARE_PROVIDER_SITE_OTHER): Payer: Medicare Other | Admitting: Dentistry

## 2021-08-23 VITALS — BP 144/85 | HR 75 | Temp 98.5°F

## 2021-08-23 DIAGNOSIS — K029 Dental caries, unspecified: Secondary | ICD-10-CM

## 2021-08-23 DIAGNOSIS — Z01818 Encounter for other preprocedural examination: Secondary | ICD-10-CM

## 2021-08-23 DIAGNOSIS — K036 Deposits [accretions] on teeth: Secondary | ICD-10-CM

## 2021-08-23 DIAGNOSIS — C760 Malignant neoplasm of head, face and neck: Secondary | ICD-10-CM

## 2021-08-23 DIAGNOSIS — K08109 Complete loss of teeth, unspecified cause, unspecified class: Secondary | ICD-10-CM

## 2021-08-23 DIAGNOSIS — K045 Chronic apical periodontitis: Secondary | ICD-10-CM

## 2021-08-23 DIAGNOSIS — F40232 Fear of other medical care: Secondary | ICD-10-CM

## 2021-08-23 DIAGNOSIS — K0530 Chronic periodontitis, unspecified: Secondary | ICD-10-CM

## 2021-08-23 NOTE — Telephone Encounter (Signed)
Pt called in refill request, said he really needs it.  metoprolol tartrate metoprolol tartrate (LOPRESSOR) 50 MG tablet  [id number] - [place], [place] - [address] AT [address] Phone:  [phone]  Fax:  [phone]

## 2021-08-23 NOTE — Progress Notes (Signed)
Department of Dental Medicine   Service Date:   08/23/2021  Patient Name:   Jordan Mccullough Date of Birth:   1940/12/29 Medical Record Number: [id number]  Referring Provider:           Eppie Gibson, M.D.   OUTPATIENT CONSULTATION PLAN/RECOMMENDATIONS   ASSESSMENT: Clinical exam was not completed today.  RECOMMENDATIONS: Return for a follow-up visit after the completion of radiation therapy. Establish dental care at an outside office of the patient's choice for routine care including cleanings/periodontal therapy and periodic exams.  PLAN: Discuss case with medical team and coordinate treatment as needed. Call should any questions or concerns arise.  Discussed in detail all treatment options and recommendations with the patient and they are agreeable to the plan.    Thank you for consulting with [facility] and for the opportunity to participate in this patient's treatment.  Should you have any questions or concerns, please contact the [facility] at [phone].    08/23/2021 CONSULT NOTE:    COVID-19 SCREENING:  The patient denies symptoms concerning for COVID-19 infection including fever, chills, cough, or newly developed shortness of breath.   HISTORY OF PRESENT ILLNESS: Jordan Mccullough is a very pleasant 81 y.o. male with h/o tobacco use (cigarettes/daily ~24 yrs), hypertension, hyperlipidemia, coronary artery disease, myocardial infarction s/p coronary angioplasty (no stents), lung cancer (h/o radiation from 10/2013-11/2013) and brain metastasis (h/o radiation in 08/2015), seizure disorder and diverticulitis who was recently diagnosed with laryngeal cancer (confirmed metastatic squamous cell carcinoma) and is anticipating head and neck radiation.   The patient presented today for a medically necessary dental consultation as part of their pre-radiation therapy work-up.   After radiographs were exposed (PAN & Full Mouth Series), and before I went in to the room to  see the patient for the clinical exam/discussion, he stated that he was having a severe episode of acid reflux/GERD and would have to go home because he will get sick (vomit) when this happens when he does not have any medicine (antiacids) to take.  His wife was informed and she and the patient left in stable condition.   DENTAL HISTORY: The patient reports that his last dental visit was about 4 years ago for a tooth extraction. He does not have a dentist he sees regularly. Patient is able to manage oral secretions.  Patient denies dysphagia, odynophagia, SOB and neck pain. Patient endorses dysphonia. Patient denies fever, rigors and malaise.   CHIEF COMPLAINT:  Here for a pre-head and neck radiation dental exam (not completed).   Patient Active Problem List   Diagnosis Date Noted   COVID-19 12/10/2020   Dysphonia 06/05/2019   Paralysis of left vocal cord 06/05/2019   Back pain 04/19/2016   Seizure disorder ([place]) 04/03/2016   DNR (do not resuscitate)    Palliative care encounter    Complaints of leg weakness 11/08/2015   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 seizures, not intractable, without status epilepticus ([place]) 10/11/2015   Syncope 08/05/2015   Seizure-like activity ([place]) 08/04/2015   Brain metastases ([place]) 08/04/2015   Seizure ([place]) 08/04/2015   AAA (abdominal aortic aneurysm) without rupture 06/21/2015   Aftercare following surgery of the circulatory system 06/21/2015   Essential hypertension 06/23/2014   Primary cancer of left upper lobe of lung ([place]) 06/19/2013   History of AAA (abdominal aortic aneurysm) repair 10/14/2012   Aneurysm of common iliac artery ([place]) 10/14/2012   Elevated PSA 07/10/2012   CAD (coronary artery  disease) 07/31/2011   DIVERTICULOSIS, COLON 07/05/2010   COLONIC POLYPS 02/24/2010   AMBLYOPIA, HX OF 02/24/2010   HYPERGLYCEMIA, FASTING 05/15/2008   Hyperlipidemia 05/23/2007   MYOCARDIAL INFARCTION,  HX OF 05/23/2007   Past Medical History:  Diagnosis Date   AAA (abdominal aortic aneurysm)    s/p repair (aortoiliac bipass; with persistent endograft leak in inferior mesenteric artery)--stable as of 01/2017 vascular f/u.   Asymptomatic cholelithiasis 07/2015   Incidental finding on PET CT   Back pain 04/19/2016   BPH with elevated PSA    PSA signif rise 11/2017 (5.4 in 2015 to 31.21 November 2017. Bx 03/2018 benign.   Coronary artery disease    MI 1992, S/P  PTCA; negative stress test in November 2011 with no ischemia.    COVID-19 virus infection 12/07/2020   Diverticulosis    Elevated PSA 01/2018   01/22/18:  PSA 26.5.  Prostate MRI with abnormality->subsequent prostate bx BENIGN 04/12/18.PSA 72019 stbl at 24.1->rose to 36 03/2020->rec'd return to urol.   Gross hematuria 05/2018   Occurred 2+ mo's after prostate bx: attributed to bx by urol, treated empirically with keflex.  Urol initially suspected gross hem was sequela of recent prost bx. BUT recurrence of gross hem prompted full hematuria w/u 11/15/18.   Hearing loss    Bilateral    Hepatic steatosis    History of radiation therapy 11/10/13-12/12/13   lung,50Gy/35fx   Hyperlipidemia    Crestor= myalgias   Hypertension    Laryngeal mass 07/2021   with L cervical lymphadenopathy.  ENT->bx 07/29/21   Lung cancer ([place]) 05/2013   non-small cell;  L upper lobectomy with mediastinal LN dissection, chemo, and radiation in 2014/2015. Remission until pt had questionable seizure 07/2015--MRI showed brain mets; palliative brain rad (stereotactic radiation therapy) started 08/25/15.  CT C/A/P clear 02/2016, 05/2016, 11/2016, 12/2018. MRI brain 12/2018 stable lesions/no new mets. Rpt 6 mo per onc.   Malignant neoplasm of upper lobe, left bronchus or lung ([place]) 10/29/2013   *?New spiculated 4.5cm mass in L upper lung field on CXR at [facility] in [place] 07/14/17.  Dr. Julien Nordmann (onc) recommended f/u CXR after abx course.  If mass unchanged then repeat  CT chest.  Surveillance CT chest and MRI brain 06/2018 show no sign of dz.   Myocardial infarction [facility]) 1992   Dr Angelena Form     Obstructive uropathy    obstructive and reflux uropathy   Peripheral vascular disease ([place]) 08/2012   4.8x4.6 cm infrarenal abdominal aortic fusiform aneurysm, 1.5 cm right common iliac artery aneurysm.  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   Pneumonia 09/06/2018   XRAY was done at [place] care.   S/P radiation therapy [facility] 08/25/15   Stereotactic radiation therapy: frontoparietal 18gy,posterior frontal lobe 20gy,   Seizure disorder ([place]) 2016/2017   as sequela of brain mets;  Grand mal seizure 07/2015, then got on keppra and was seizure-free until focal motor seizures of L side of face began 02/2016- these responded well to up-titration of keppra but pt had adverse side effects so eventually pt had to be switched over to lamictal 07/2016--stable/seizure free on this med as of 09/2018 neuro f/u.   Tibial plateau fracture, right 03/2021   Tobacco dependence    "Quit" 1992, but pt has smoked "on and off" since that time   Unilateral vocal cord paralysis    left, onset s/p mediastinoscopy  with mediastinal LN resection 2014.   Past Surgical History:  Procedure Laterality Date   ABDOMINAL AORTIC ENDOVASCULAR STENT GRAFT N/A 05/07/2014   Procedure: ABDOMINAL AORTIC ENDOVASCULAR STENT GRAFT;  Surgeon: Serafina Mitchell, MD;  Location: [facility];  Service: Vascular;  Laterality: N/A;   Carotid duplex dopplers  07/2015   1-39% on R, no signif dz noted on L   COLONOSCOPY W/ POLYPECTOMY  2003    negative 2010,due 2020; Dr Olevia Perches   CORONARY ANGIOPLASTY     no stents   CYSTOSCOPY/RETROGRADE/URETEROSCOPY Bilateral 10/09/2012   Procedure: BILATERAL RETROGRADE bladder and urethral BIOPSY ;  Surgeon: Molli Hazard, MD;  Location: [facility];  Service: Urology;  Laterality:  Bilateral;  BILATERAL RETROGRADE    EEG  08/05/15   Pt placed on keppra just prior to this test due to having ? seizure (MRI showed brain mets)   HERNIA REPAIR Bilateral    Inguinal   INGUINAL HERIIORRHAPHY BILATERALLY     IR ANGIOGRAM SELECTIVE EACH ADDITIONAL VESSEL  12/03/2017   IR ANGIOGRAM VISCERAL SELECTIVE  12/03/2017   IR ANGIOGRAM VISCERAL SELECTIVE  12/03/2017   IR EMBO ARTERIAL NOT HEMORR HEMANG INC GUIDE ROADMAPPING  12/03/2017   IR GENERIC HISTORICAL  07/20/2016   IR RADIOLOGIST EVAL & MGMT 07/20/2016 [facility] INTERV RAD   IR GENERIC HISTORICAL  08/02/2016   IR RADIOLOGIST EVAL & MGMT 08/02/2016 Sandi Mariscal, MD [facility] INTERV RAD   IR RADIOLOGIST EVAL & MGMT  11/08/2017   IR RADIOLOGIST EVAL & MGMT  01/01/2018   IR RADIOLOGIST EVAL & MGMT  08/27/2019   IR RADIOLOGIST EVAL & MGMT  09/16/2020   IR US GUIDE VASC ACCESS RIGHT  12/03/2017   LARYNGOSCOPY AND ESOPHAGOSCOPY N/A 08/09/2021   Procedure: MICRO DIRECT LARYNGOSCOPY WITH BIOPSY AND ESOPHAGOSCOPY;  Surgeon: Melida Quitter, MD;  Location: [facility];  Service: ENT;  Laterality: N/A;   MEDIASTINOSCOPY N/A 05/01/2013   Procedure: MEDIASTINOSCOPY;  Surgeon: Gaye Pollack, MD;  Location: [facility];  Service: Thoracic;  Laterality: N/A;   PFTs  04/2013   Minimal obstructive airway disease   PILONIDAL CYST EXCISION     PROSTATE BIOPSY N/A 10/09/2012   NEG bx 04/12/18 as well.  Procedure: PROSTATIC URETHRAL BIOPSY--BPH--no evidence of malignancy;  Surgeon: Molli Hazard, MD;  Location: [facility];  Service: Urology;  Laterality: N/A;  PROSTATIC URETHRAL BIOPSY   PROSTATE BIOPSY  03/2018   NEG   PTCA  1992   ROTATOR CUFF REPAIR     Bilateral   THOROCOTOMY WITH LOBECTOMY Left 05/29/2013   Procedure: LEFT THOROCOTOMY WITH LEFT UPPER LOBE LOBECTOMY;  Surgeon: Gaye Pollack, MD;  Location: [facility];  Service: Thoracic;  Laterality: Left;   TRANSTHORACIC ECHOCARDIOGRAM  07/2015   EF 50-55%, hypokin of inf myoc, grd I DD, mild MR, mod dilat of LA.    VIDEO BRONCHOSCOPY N/A 05/01/2013   Procedure: VIDEO BRONCHOSCOPY;  Surgeon: Gaye Pollack, MD;  Location: [facility];  Service: Thoracic;  Laterality: N/A;   Allergies  Allergen Reactions   Quinolones Other (See Comments)    Pt with aneurism: need to avoid quinoline use b/c of weakening of vascular wall that can be associated with quinolones.   Wellbutrin [Bupropion] Other (See Comments)    AVOID IN PT W/HX OF SEIZURES   Rosuvastatin Other (See Comments)    Myalgias and dark urine   Iodinated Contrast Media Hives    1 hive on lt cheek lasting approximately 1 hour on last 2  CT per pt; needs pre meds in future; 50 mg benadryl po 1 hr prior to exam per Dr. Weber Cooks 07/2014  11/2016 per Tery Sanfilippo, pt needs 13hr premeds per our policy.   Current Outpatient Medications  Medication Sig Dispense Refill   amLODipine (NORVASC) 5 MG tablet TAKE 1 TABLET BY MOUTH EVERY DAY 90 tablet 1   aspirin 81 MG chewable tablet Chew 81 mg by mouth daily.     atorvastatin (LIPITOR) 20 MG tablet TAKE 1 TABLET BY MOUTH EVERY DAY 90 tablet 1   budesonide-formoterol (SYMBICORT) 160-4.5 MCG/ACT inhaler INHALE 1-2 PUFFS INTO THE LUNGS EVERY 12 HOURS. GARGLE AND SPIT AFTER USE. 10.2 each 11   diphenhydrAMINE (BENADRYL) 50 MG tablet Take 50mg  of Benadryl along with 50mg  of Prednisone 2 hours before CT scan. 1 tablet 0   lamoTRIgine (LAMICTAL) 200 MG tablet Take 1 tablet in the morning and 1 and 1/2 tablets in the evening 225 tablet 3   metoprolol tartrate (LOPRESSOR) 50 MG tablet TAKE 1 TABLET BY MOUTH TWICE A DAY 180 tablet 0   Multiple Vitamin (MULTIVITAMIN WITH MINERALS) TABS tablet Take 1 tablet by mouth daily.     NONFORMULARY OR COMPOUNDED ITEM Antifungal solution: Terbinafine 3%, Fluconazole 2%, Tea Tree Oil 5%, Urea 10%, Ibuprofen 2% in DMSO suspension #54mL (Patient not taking: Reported on 08/04/2021) 1 each 3   pantoprazole (PROTONIX) 40 MG tablet TAKE 1 TABLET BY MOUTH EVERY DAY 90 tablet 0   predniSONE (DELTASONE)  50 MG tablet Take 50mg  prednisone 13 hours, then 50mg  prednisone 7 hours, then 50mg  prednisone along with 50mg  of Benadryl 2 hours before CT Scan. 3 tablet 0   No current facility-administered medications for this visit.    LABS: Lab Results  Component Value Date   WBC 11.8 (H) 08/09/2021   HGB 14.3 08/09/2021   HCT 43.0 08/09/2021   MCV 89.0 08/09/2021   PLT 251 08/09/2021      Component Value Date/Time   NA 139 08/09/2021 0630   NA 141 06/18/2017 1006   K 4.0 08/09/2021 0630   K 5.3 (H) 06/18/2017 1006   CL 105 08/09/2021 0630   CO2 27 08/09/2021 0630   CO2 25 06/18/2017 1006   GLUCOSE 104 (H) 08/09/2021 0630   GLUCOSE 146 (H) 06/18/2017 1006   BUN 14 08/09/2021 0630   BUN 14.3 06/18/2017 1006   CREATININE 1.02 08/09/2021 0630   CREATININE 0.87 04/07/2021 1132   CREATININE 0.9 06/18/2017 1006   CALCIUM 9.3 08/09/2021 0630   CALCIUM 10.3 06/18/2017 1006   GFRNONAA >60 08/09/2021 0630   GFRNONAA >60 04/07/2021 1132   GFRAA >60 07/10/2019 1037   Lab Results  Component Value Date   INR 0.90 12/03/2017   INR 1.05 05/07/2014   INR 1.00 05/05/2014   No results found for: PTT  Social History   Socioeconomic History   Marital status: Married    Spouse name: Not on file   Number of children: Not on file   Years of education: Not on file   Highest education level: Not on file  Occupational History   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  Tobacco Use   Smoking status: Every Day    Packs/day: 1.00    Years: 24.00    Pack years: 24.00    Types: Cigarettes    Last attempt to quit: 05/28/2013    Years since quitting: 8.2   Smokeless tobacco: Never   Tobacco comments:    QUIT 15 YEARS AGO-07/03/17 smoking  Vaping  Use   Vaping Use: Never used  Substance and Sexual Activity   Alcohol use: Yes    Alcohol/week: 15.0 standard drinks    Types: 3 Glasses of wine, 12 Cans of beer per week    Comment: 1-2 beers a day   Drug use: No   Sexual activity: Not on file  Other  Topics Concern   Not on file  Social History Narrative   HEART HEALTHY DIET.     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   MARRIED, 2 children who live in [place].  Smoked on and off since then.   ALCOHOL USE -YES- RED WINE SOCIALLY, couple of beers at night usually.   07-08-18 Unable to ask abuse questions wife with him today.   Right handed    Social Determinants of Health   Financial Resource Strain: Low Risk    Difficulty of Paying Living Expenses: Not hard at all  Food Insecurity: No Food Insecurity   Worried About Charity fundraiser in the Last Year: Never true   Ran Out of Food in the Last Year: Never true  Transportation Needs: No Transportation Needs   Lack of Transportation (Medical): No   Lack of Transportation (Non-Medical): No  Physical Activity: Insufficiently Active   Days of Exercise per Week: 4 days   Minutes of Exercise per Session: 30 min  Stress: No Stress Concern Present   Feeling of Stress : Not at all  Social Connections: Unknown   Frequency of Communication with Friends and Family: Not on file   Frequency of Social Gatherings with Friends and Family: Not on file   Attends Religious Services: 1 to 4 times per year   Active Member of [employer] or Organizations: Patient refused   Attends Music therapist: Not on file   Marital Status: Married  Human resources officer Violence: Not on file   Family History  Problem Relation Age of Onset   Hypertension Mother    Prostate cancer Father        Prostate cancer   Cancer Father        Brain Tumor   Lung cancer Sister        NON SMOKER   Cancer Sister    Hyperlipidemia Sister    Hyperlipidemia Brother    Heart attack Brother    Hypertension Brother    Prostate cancer Brother    Diabetes Neg Hx    Stroke Neg Hx      REVIEW OF SYSTEMS:  Reviewed with the patient as per HPI. Psych:  (++) Dental phobia   VITAL SIGNS: BP (!) 144/85 (BP Location: Right Arm, Patient Position: Sitting,  Cuff Size: Normal)    Pulse 75    Temp 98.5 F (36.9 C) (Oral)     RADIOGRAPHIC EXAM:  PAN and Full Mouth Series exposed and interpreted.        Condyles seated bilaterally in fossas.  No evidence of abnormal pathology.  All visualized osseous structures appear WNL. Lower right side in body of mandible (posterior to #32) there may be a retained root tip; no signs of radiolucency surrounding root (possible history of extraction of supernumerary tooth)         Generalized moderate horizontal bone loss with areas of localized severe consistent with severe periodontitis. Radiographic calculus evident.    Missing teeth. Existing restorations/crown&bridge work evident on teeth #'s 4, 5, 6, 7, 11, 12, 13, 14, 15, 18-20 (3-unit bridge w/ #19 pontic), #21, #27, #  28, #29-#31(3-unit bridge w/ #30 pontic) & #32.  Teeth #'s 5, 6, 7, 12, 14 & 27 have been previously endodontically treated; #5, #7 & #14 appear to have periapical radiolucencies either from prior infection or developing.  #18 has periapical radiolucency surrounding M&D roots.  Caries- #5D, #30M, #7D, #8D, #9D, #76M, #12D, #14D, #41M&D, #69M, #24D, #31D & #41M.     ASSESSMENT:  1.  Laryngeal cancer 2.  Pre-head and neck radiation therapy dental consult 3.  Missing teeth 4.  Caries 5.  Chronic apical periodontitis 6.  Accretions on teeth 7.  Chronic periodontitis 8.  Dental phobia   PLAN AND RECOMMENDATIONS: Per Dr. Pearlie Oyster message regarding the patient prior to his dental consult:  "Roots will be below 50Gy and we will need to sim on 1/9 regardless...primary is below oral cavity. He'll be at risk for moderate xerostomia."     Although the patient only had radiographic exam completed today, in his case this will be sufficient and he is okay to proceed with radiation as planned.  We will not need to bring him back in for an appointment until after he has completed radiation.  At that time, a clinical exam will be completed and a thorough  discussion will be had with the patient regarding side effects of radiation, risks/complications, findings, recommendations and risks vs benefits of different treatment options. The After Visit Summary was given to the patient prior to his departure which includes in detail each side effect of radiation that would have been discussed during today's visit. Plan to discuss all findings and recommendations with medical team and coordinate future care as needed.    Follow-up after the completion of radiation therapy. Call should any questions or concerns arise.  The patient tolerated today's visit well and departed with his wife in stable condition.    Helvetia Benson [place], D.M.D.

## 2021-08-23 NOTE — Patient Instructions (Signed)
[place] [facility] Dr. Debe Coder B. Benson [place], D.M.D. Phone: [phone] Fax: [phone]      It was a pleasure seeing you today!  Please refer to the information below regarding your dental visit with [place].  Please do not hesitate to give [place] a call if any questions or concerns come up after you leave.    Thank you for letting us provide care for you.  If there is anything we can do for you, please let us know.     RADIATION THERAPY AND INFORMATION REGARDING YOUR TEETH  XEROSTOMIA (dry mouth):  Your salivary glands may be in the field of radiation.  Radiation may include all or only part of your salivary glands.  This will cause your saliva to dry up, and you will have a dry mouth.  The dry mouth will be for the rest of your life unless your radiation oncologist tells you otherwise.       Your saliva has many functions: It wets your tongue for speaking. It coats your teeth and the inside of your mouth for easier movement. It helps with chewing and swallowing food. It helps clean away harmful acid and toxic products made by the germs in your mouth, therefore it helps prevent cavities. It kills some germs in your mouth and helps to prevent gum disease. It helps to carry flavor to your taste buds.       Once you have lost your saliva, you will be at higher risk for tooth decay and gum disease.         What can be done to help improve your mouth when there's not enough saliva? Your dentist may give a recommendation for CLoSYS.  It will not bring back all of your saliva but may bring back some of it.  Also, your saliva may be thick and ropy or white and foamy.  It will not feel like it use to feel. You will need to swish with water every time your mouth feels dry.  YOU CANNOT suck on any cough drops, mints, lemon drops, candy, vitamin C or any other products.  You cannot use anything other than water to make your mouth feel less dry.  If  you want to drink anything else, you have to drink it all at once and brush afterwards.  Be sure to discuss the details of your diet habits with your dentist or hygienist.   RADIATION CARIES:  This is decay (cavities) that happens very quickly  once your mouth is very dry due to radiation therapy.  Normally, cavities take six months to two years to become a problem.  When you have dry mouth, cavities may take as little as eight weeks to cause you a problem.    Dental check-ups every 2-4 months are necessary as long as you have a dry mouth.  Radiation caries typically, but not always, starts at your gum line where it is hard to see the cavity.  It is therefore also hard to fill these cavities adequately.  This high rate of cavities happens because your mouth no longer has saliva and therefore the acid made by the germs starts the decay process.  Whenever you eat anything the germs in your mouth change the food into acid.  The acid then burns a small hole in your tooth.  This small hole is the beginning of a cavity.  If this is not treated then it will grow bigger and become a  cavity.  The way to avoid this hole getting bigger is to use fluoride every evening as prescribed by your dentist following your radiation. NOTE:  You have to make sure that your teeth are very clean before you use the fluoride.  This fluoride in turn will strengthen your teeth and prepare them for another day of fighting acid. If you develop radiation caries many times, the damage is so large that you will have to have all your teeth removed.  This could be a big problem if some of these teeth are in the field of radiation.  Further details of why this could be a big problem will follow (see Osteoradionecrosis below).   DYSGEUSIA (loss of taste):  This happens to varying degrees once you've had radiation therapy to your jaw region.  Many times taste is not completely lost, but becomes limited.  The loss of taste is mostly due to  radiation affecting your taste buds.  However, if you have no saliva in your mouth to carry the flavor to your taste buds, it would be difficult for your taste buds to taste anything.  That is why using water or a prescription for Salagen prior to meals and during meal times may help with some of the taste.  Keep in mind that taste generally returns very slowly over the course of several months or several years after radiation therapy.  Don't give up hope.   TRISMUS (limited jaw opening):  According to your radiation oncologist, your TMJ or jaw joints are going to be partially or fully in the field of radiation.  This means that over time the muscles that help you open and close your mouth may get stiff.  This will potentially result in your not being able to open your mouth wide enough or as wide as you can open it now.         Let me give you an example of how slowly this happens and how unaware people are of it:    A gentlemen that had radiation therapy 2 years ago went back to his dentist complaining that "bananas were just too large for him to be able to fit them in between his teeth."  He was no longer able to open wide enough to bite into a banana.  This happened slowly and over a period of time.       What we do to try and prevent this:   Your dentist will probably give you a stack of sticks called a trismus exercise device.  This stack will help remind your muscles and your jaw joints to open up to the same distance every day.  Use these sticks every morning when you wake up, or according to the instructions given by your dentist.    You must use these sticks for at least one to two years after radiation therapy.  The reason for that is because it happens so slowly and keeps going on for about two years after radiation therapy.  Your hospital dentist will help you monitor your mouth opening and make sure that it's not getting smaller after radiation.     TRISMUS EXERCISES: Using the stack of  sticks given to you by your dentist, place the stack in your mouth and hold onto the other end for support. Leave the sticks in your mouth while holding the other end.  Allow 30 seconds for muscle stretching. Rest for a few seconds. Repeat 3-5 times. This exercise is recommended in the  mornings and evenings unless otherwise instructed. The exercise should be done for a period of 2 YEARS after the end of radiation. Your maximum jaw opening should be checked regularly at recall dental visits by your general dentist. You should report any changes, soreness, or difficulties encountered when doing the exercises to your dentist.   OSTEORADIONECROSIS (ORN):  This is a condition where your jaw bone after radiation therapy becomes very dry.  It has very little blood supply to keep it alive.  If you develop a cavity that turns into an abscess or an infection, then the jaw bone does not have enough blood supply to help fight the infection.  At this point it is very likely that the infection could cause the death of your jaw bone.  When you have dead bone it has to be removed.  Therefore, you might end up having to have surgery to remove part of your jaw bone, the part of the jaw bone that has been affected.     Healing is also a problem if you are to have surgery (like a tooth extraction) in the areas where the bone has had radiation therapy.  If you have surgery, you need more blood supply to heal which is not available.  When blood supply and oxygen are not available, there is a chance for the bone to die. Occasionally, ORN happens on its own with no obvious reason, but this is quite rare.  We believe that patients who continue to smoke and/or drink alcohol have a higher chance of having this problem. Once your jaw bone has had radiation therapy, if there are any remaining teeth in that area, it is not recommended to have them pulled unless your dentist or oral surgeon is aware of your history of radiation and  believes it is safe.  The risks for ORN either from infection or spontaneously occurring (with no reason) are life long.   QUESTIONS?  Call our office during office hours at [phone].

## 2021-08-23 NOTE — Telephone Encounter (Signed)
Cancelled pt's new pt appt with Dr. Chryl Heck and scheduled f/u with Dr. Julien Nordmann per MD inbasiket msg on 1/3. Called pt, no answer. Left msg for pt with updated appt date and time. Also left my direct number for pt to call back to confirm appt change.

## 2021-08-23 NOTE — Telephone Encounter (Signed)
LVM for pt to CB regarding rx.   Note:  Pt was sent 90 d/s 07/25/21

## 2021-08-24 ENCOUNTER — Encounter: Payer: Self-pay | Admitting: Family Medicine

## 2021-08-24 DIAGNOSIS — K045 Chronic apical periodontitis: Secondary | ICD-10-CM | POA: Insufficient documentation

## 2021-08-24 DIAGNOSIS — F40232 Fear of other medical care: Secondary | ICD-10-CM | POA: Insufficient documentation

## 2021-08-24 DIAGNOSIS — K029 Dental caries, unspecified: Secondary | ICD-10-CM | POA: Insufficient documentation

## 2021-08-24 DIAGNOSIS — Z01818 Encounter for other preprocedural examination: Secondary | ICD-10-CM | POA: Insufficient documentation

## 2021-08-24 DIAGNOSIS — K0530 Chronic periodontitis, unspecified: Secondary | ICD-10-CM | POA: Insufficient documentation

## 2021-08-24 DIAGNOSIS — K08109 Complete loss of teeth, unspecified cause, unspecified class: Secondary | ICD-10-CM | POA: Insufficient documentation

## 2021-08-24 DIAGNOSIS — C760 Malignant neoplasm of head, face and neck: Secondary | ICD-10-CM | POA: Insufficient documentation

## 2021-08-24 DIAGNOSIS — K036 Deposits [accretions] on teeth: Secondary | ICD-10-CM | POA: Insufficient documentation

## 2021-08-24 NOTE — Telephone Encounter (Signed)
Pt advised 90 d/s sent 12/5. He received a text today stating it is ready to be picked up

## 2021-08-25 DIAGNOSIS — C329 Malignant neoplasm of larynx, unspecified: Secondary | ICD-10-CM | POA: Diagnosis not present

## 2021-08-25 DIAGNOSIS — C7989 Secondary malignant neoplasm of other specified sites: Secondary | ICD-10-CM | POA: Diagnosis not present

## 2021-08-26 ENCOUNTER — Ambulatory Visit (HOSPITAL_COMMUNITY)
Admission: RE | Admit: 2021-08-26 | Discharge: 2021-08-26 | Disposition: A | Discharge status: Home / Self Care | Payer: Medicare Other | Source: Ambulatory Visit | Attending: Radiation Oncology | Admitting: Radiation Oncology

## 2021-08-26 ENCOUNTER — Other Ambulatory Visit: Payer: Self-pay

## 2021-08-26 DIAGNOSIS — J387 Other diseases of larynx: Secondary | ICD-10-CM | POA: Diagnosis not present

## 2021-08-26 DIAGNOSIS — C329 Malignant neoplasm of larynx, unspecified: Secondary | ICD-10-CM | POA: Insufficient documentation

## 2021-08-26 DIAGNOSIS — C3492 Malignant neoplasm of unspecified part of left bronchus or lung: Secondary | ICD-10-CM | POA: Diagnosis not present

## 2021-08-26 DIAGNOSIS — I96 Gangrene, not elsewhere classified: Secondary | ICD-10-CM | POA: Diagnosis not present

## 2021-08-26 LAB — GLUCOSE, CAPILLARY: Glucose-Capillary: 120 mg/dL — ABNORMAL HIGH (ref 70–99)

## 2021-08-26 MED ORDER — FLUDEOXYGLUCOSE F - 18 (FDG) INJECTION
5.4000 | Freq: Once | INTRAVENOUS | Status: AC
  Administered 2021-08-26: 5.4 via INTRAVENOUS
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
## 2021-08-28 NOTE — Progress Notes (Signed)
Radiation Oncology         [phone] ________________________________  Initial Outpatient Consultation  Name: Jordan Mccullough MRN: [id number]  Date: 08/29/2021  DOB: 10/22/1940  CC:Jordan Mccullough, Jordan Blackwater, MD  Jordan Quitter, MD   REFERRING PHYSICIAN: Melida Quitter, MD  DIAGNOSIS: C32.0   ICD-10-CM   1. Head and neck cancer (HCC)  C76.0 sodium chloride flush (NS) 0.9 % injection 10 mL    2. Glottis carcinoma (HCC)  C32.0      Squamous cell carcinoma of the larynx   Cancer Staging  Glottis carcinoma [facility]) Staging form: Larynx - Glottis, AJCC 8th Edition - Clinical stage from 08/29/2021: Stage IVA (cT2, cN2a, cM0) - Signed by Jordan Gibson, MD on 08/29/2021  Primary cancer of left upper lobe of lung ([place]) Staging form: Lung, AJCC 7th Edition - Clinical: Stage IIIA (T2a, N2, M0) - Signed by Jordan Bears, MD on 06/19/2013 Histopathologic type: Adenocarcinoma, NOS Laterality: Left - Pathologic: No stage assigned - Unsigned Histopathologic type: Adenocarcinoma, NOS Laterality: Left    In remission per lung cancer - did have SRS to brain metastases in 2017  CHIEF COMPLAINT: Here to discuss management of laryngeal cancer  HISTORY OF PRESENT ILLNESS::Jordan Mccullough is a 81 y.o. male who presented to his PCP, Dr. Anitra Mccullough, on 07/19/21 with reports of painless swelling on the left side of his neck a couple of months ago. The patient also reported the swelling to have increased over time. Given the patient's history of metastatic lung cancer (detailed below), Dr. Anitra Mccullough noted his presenting features as worrisome for malignancy.   Soft tissue neck CT ordered by Dr. Anitra Mccullough on 07/22/21 demonstrated an anterior larynx mass and necrotic left jugular chain adenopathy most consistent with squamous cell carcinoma.  Subsequently, the patient was referred to Dr. Redmond Mccullough on 07/29/21.  During this visit, he patient reported his voice to have worsened over the past 6 months, as well as coughing after  eating on occasion.   Subsequently, Dr. Redmond Mccullough performed a laryngoscopy on that same date which revealed a mass of the anterior commissure extending inferiorly. The left vocal fold was also noted as immobile. Findings were ultimately noted as concerning for laryngeal cancer.  Dr. Redmond Mccullough also performed FNA of the mass that same date (07/29/21) which revealed squamous cell carcinoma.  The patient proceeded to undergo further biopsies of the commissure mass under anesthesia on 08/09/21 which revealed invasive keratinizing squamous cell carcinoma.  I referred the patient to dentistry on 08/23/20 for pre-radiation therapy work-up. However, before the dentist could examine him, the patient stated that he has a severe episode of GERD and went home. On the provided form given, the patient denied dysphagia, odynophagia, SOB and neck pain, though the patient endorsed dysphonia. Although the patient was only able to complete a radiographic exam during this visit, this was deemed sufficient and he was cleared to proceed with radiation as planned.   Pertinent imaging thus far includes a PET scan performed on 08/26/21 revealing the small hypermetabolic anterior laryngeal lesion, and associated partially necrotic left neck adenopathy consistent with known cancer. No findings for metastatic disease involving the chest, abdomen, pelvis or bony structures were appreciated. (Remote surgical and radiation changes were also seen from left lung cancer, but no findings to suggest recurrent tumor were appreciated).   Swallowing issues, if any: food lodges in throat at times.  Weight Changes: none reported  Pain status: none reported  Other symptoms: increased left neck swelling reported upon initial evaluation, change  in voice over the past 6-7 months, coughing after eating on occasion  Tobacco history, if any: current smoker- 3 to 4 ppd per family report  ETOH abuse, if any: reports 15.0 standard drinks/week  Prior  cancers, if any: Stage IIIA (T2a., N2, M0) non-small cell lung cancer adenocarcinoma with negative EGFR mutation and negative ALK gene translocation involving the left upper lobe middle mediastinal lymphadenopathy diagnosed in August of 2014. He had metastatic disease to the brain diagnosed in December 2016  PRIOR THERAPY:  1) Status post left upper lobectomy with mediastinal lymph node dissection on 05/29/2013. 2) Adjuvant chemotherapy with cisplatin at 75 mg per meter squared and Alimta at 500 mg per meter squared given every 3 weeks for a total of 4 cycles. First cycle given on 07/10/2013  PREVIOUS RADIATION THERAPY:   1) Curative radiotherapy to the mediastinum under the care of Dr. Lisbeth Mccullough completed on 12/12/2013. -The patient was treated to the high-risk region within the central chest/mediastinum. The patient was treated with a 3-D conformal technique with daily image guidance. The patient received 50 gray in 25 fractions.  2) Status post stereotactic radiotherapy to the metastatic brain lesions on 08/25/2015 under the care of Dr. Lisbeth Mccullough. 1.  PTV1 Rt Vertex Frontoparietal 23m target was treated using 4 Arcs to a prescription dose of 18 Gy. ExacTrac Snap verification was performed for each couch angle.  2.  PTV2  Rt Posterior Frontal Lobe 162mtarget was treated using 4 Arcs to a prescription dose of 20 Gy. ExacTrac Snap verification was performed for each couch angle.  PAST MEDICAL HISTORY:  has a past medical history of AAA (abdominal aortic aneurysm), Asymptomatic cholelithiasis (07/2015), Back pain (04/19/2016), BPH with elevated PSA, Coronary artery disease, COVID-19 virus infection (12/07/2020), Diverticulosis, Elevated PSA (01/2018), Gross hematuria (05/2018), Hearing loss, Hepatic steatosis, History of radiation therapy (11/10/13-12/12/13), Hyperlipidemia, Hypertension, Laryngeal mass (07/2021), Lung cancer ([facility]05[place]), Malignant neoplasm of upper lobe, left bronchus or lung ([place] (10/29/2013), [place] ([place]1992), Obstructive uropathy, Peripheral vascular disease ([place]08/2012), Pneumonia (09/06/2018), S/P radiation therapy (SRS 08/25/15), Seizure disorder ([facility]), Tibial plateau fracture, right (03/2021), Tobacco dependence, and Unilateral vocal cord paralysis.    PAST SURGICAL HISTORY: Past Surgical History:  Procedure Laterality Date   ABDOMINAL AORTIC ENDOVASCULAR STENT GRAFT N/A 05/07/2014   Procedure: ABDOMINAL AORTIC ENDOVASCULAR STENT GRAFT;  Surgeon: VaSerafina MitchellMD;  [place];  Service: Vascular;  Laterality: N/A;   Carotid duplex dopplers  07/2015   1-39% on R, no signif dz noted on L   COLONOSCOPY W/ POLYPECTOMY  2003    negative 2010,due 2020; Dr BrOlevia Perches CORONARY ANGIOPLASTY     no stents   CYSTOSCOPY/RETROGRADE/URETEROSCOPY Bilateral 10/09/2012   Procedure: BILATERAL RETROGRADE bladder and urethral BIOPSY ;  Surgeon: DaMolli HazardMD;  [facility];  Service: Urology;  Laterality: Bilateral;  BILATERAL RETROGRADE    EEG  08/05/15   Pt placed on keppra just prior to this test due to having ? seizure (MRI showed brain mets)   HERNIA REPAIR Bilateral    Inguinal   INGUINAL HERIIORRHAPHY BILATERALLY     IR ANGIOGRAM SELECTIVE EACH ADDITIONAL VESSEL  12/03/2017   IR ANGIOGRAM VISCERAL SELECTIVE  12/03/2017   IR ANGIOGRAM VISCERAL SELECTIVE  12/03/2017   IR EMBO ARTERIAL NOT HEMORR HEMANG INC GUIDE ROADMAPPING  12/03/2017   IR GENERIC HISTORICAL  07/20/2016   IR RADIOLOGIST EVAL & MGMT 07/20/2016 [facility]   IR GENERIC HISTORICAL  08/02/2016   IR RADIOLOGIST EVAL & MGMT 08/02/2016 Sandi Mariscal, MD [facility]   IR RADIOLOGIST EVAL & MGMT  11/08/2017   IR RADIOLOGIST EVAL & MGMT  01/01/2018   IR RADIOLOGIST EVAL & MGMT  08/27/2019   IR RADIOLOGIST EVAL & MGMT  09/16/2020   IR US GUIDE VASC ACCESS RIGHT  12/03/2017   LARYNGOSCOPY AND ESOPHAGOSCOPY N/A 08/09/2021   Procedure: MICRO DIRECT LARYNGOSCOPY  WITH BIOPSY AND ESOPHAGOSCOPY;  Surgeon: Jordan Quitter, MD;  Location: [facility];  Service: ENT;  Laterality: N/A;   MEDIASTINOSCOPY N/A 05/01/2013   Procedure: MEDIASTINOSCOPY;  Surgeon: Gaye Pollack, MD;  Location: [facility];  Service: Thoracic;  Laterality: N/A;   PFTs  04/2013   Minimal obstructive airway disease   PILONIDAL CYST EXCISION     PROSTATE BIOPSY N/A 10/09/2012   NEG bx 04/12/18 as well.  Procedure: PROSTATIC URETHRAL BIOPSY--BPH--no evidence of malignancy;  Surgeon: Molli Hazard, MD;  [facility];  Service: Urology;  Laterality: N/A;  PROSTATIC URETHRAL BIOPSY   PROSTATE BIOPSY  03/2018   NEG   PTCA  1992   ROTATOR CUFF REPAIR     Bilateral   THOROCOTOMY WITH LOBECTOMY Left 05/29/2013   Procedure: LEFT THOROCOTOMY WITH LEFT UPPER LOBE LOBECTOMY;  Surgeon: Gaye Pollack, MD;  [facility];  Service: Thoracic;  Laterality: Left;   TRANSTHORACIC ECHOCARDIOGRAM  07/2015   EF 50-55%, hypokin of inf myoc, grd I DD, mild MR, mod dilat of LA.   VIDEO BRONCHOSCOPY N/A 05/01/2013   Procedure: VIDEO BRONCHOSCOPY;  Surgeon: Gaye Pollack, MD;  [facility];  Service: Thoracic;  Laterality: N/A;    FAMILY HISTORY: family history includes Cancer in his father and sister; Heart attack in his brother; Hyperlipidemia in his brother and sister; Hypertension in his brother and mother; Lung cancer in his sister; Prostate cancer in his brother and father.  SOCIAL HISTORY:  reports that he has been smoking cigarettes. He has a 24.00 pack-year smoking history. He has never used smokeless tobacco. He reports current alcohol use of about 15.0 standard drinks per week. He reports that he does not use drugs.  ALLERGIES: Quinolones, Wellbutrin [bupropion], Rosuvastatin, and Iodinated contrast media  MEDICATIONS:  Current Outpatient Medications  Medication Sig Dispense Refill   amLODipine (NORVASC) 5 MG tablet TAKE 1 TABLET BY MOUTH EVERY DAY 90 tablet 1   aspirin 81 MG chewable tablet  Chew 81 mg by mouth daily.     atorvastatin (LIPITOR) 20 MG tablet TAKE 1 TABLET BY MOUTH EVERY DAY 90 tablet 1   budesonide-formoterol (SYMBICORT) 160-4.5 MCG/ACT inhaler INHALE 1-2 PUFFS INTO THE LUNGS EVERY 12 HOURS. GARGLE AND SPIT AFTER USE. 10.2 each 11   lamoTRIgine (LAMICTAL) 200 MG tablet Take 1 tablet in the morning and 1 and 1/2 tablets in the evening 225 tablet 3   metoprolol tartrate (LOPRESSOR) 50 MG tablet TAKE 1 TABLET BY MOUTH TWICE A DAY 180 tablet 0   tamsulosin (FLOMAX) 0.4 MG CAPS capsule Take 0.4 mg by mouth daily.     diphenhydrAMINE (BENADRYL) 50 MG tablet Take 29m of Benadryl along with 531mof Prednisone 2 hours before CT scan. (Patient not taking: Reported on 08/29/2021) 1 tablet 0   pantoprazole (PROTONIX) 40 MG tablet TAKE 1 TABLET BY MOUTH EVERY DAY 90 tablet 0   predniSONE (DELTASONE) 50 MG tablet Take 5063mrednisone 13 hours, then 49m18mednisone 7 hours, then 49mg58mdnisone along with 49mg 29menadryl 2 hours  before CT Scan. (Patient not taking: Reported on 08/29/2021) 3 tablet 0   Current Facility-Administered Medications  Medication Dose Route Frequency Provider Last Rate Last Admin   sodium chloride flush (NS) 0.9 % injection 10 mL  10 mL Intravenous Once Jordan Gibson, MD        REVIEW OF SYSTEMS:  Notable for that above.   PHYSICAL EXAM:  height is 5' 4"  (1.626 m) and weight is 139 lb 8 oz (63.3 kg). His temporal temperature is 97.3 F (36.3 C) (abnormal). His blood pressure is 157/90 (abnormal) and his pulse is 86. His respiration is 18 and oxygen saturation is 98%.   General: Alert and oriented, in no acute distress HEENT: Head is normocephalic. Extraocular movements are intact. Oropharynx is notable for no upper throat lesions. He is hoarse. Tobacco smell noted, strong. Neck: Neck is notable for firm mass, 4cm approx, in level 2/3 left neck. No skin involvement. Heart: Regular in rate and rhythm with no murmurs, rubs, or gallops. Chest: Clear to  auscultation bilaterally, with no rhonchi, wheezes, or rales. Abdomen: Soft, nontender, nondistended, with no rigidity or guarding. Extremities: pitting ankle edema. Lymphatics: see Neck Exam Skin: No concerning lesions. Musculoskeletal: ambulatory. Neurologic: Cranial nerves II through XII are grossly intact. No obvious focalities. Speech is fluent. Coordination is intact. Psychiatric: Judgment and insight are intact. Affect is appropriate.   ECOG = 1  0 - Asymptomatic (Fully active, able to carry on all predisease activities without restriction)  1 - Symptomatic but completely ambulatory (Restricted in physically strenuous activity but ambulatory and able to carry out work of a light or sedentary nature. For example, light housework, office work)  2 - Symptomatic, <50% in bed during the day (Ambulatory and capable of all self care but unable to carry out any work activities. Up and about more than 50% of waking hours)  3 - Symptomatic, >50% in bed, but not bedbound (Capable of only limited self-care, confined to bed or chair 50% or more of waking hours)  4 - Bedbound (Completely disabled. Cannot carry on any self-care. Totally confined to bed or chair)  5 - Death   Eustace Pen MM, Creech RH, Tormey DC, et al. [phone]). "Toxicity and response criteria of the [facility]". [place] Oncol. 5 (6): 649-55   LABORATORY DATA:  Lab Results  Component Value Date   WBC 11.8 (H) 08/09/2021   HGB 14.3 08/09/2021   HCT 43.0 08/09/2021   MCV 89.0 08/09/2021   PLT 251 08/09/2021   CMP     Component Value Date/Time   NA 139 08/09/2021 0630   NA 141 06/18/2017 1006   K 4.0 08/09/2021 0630   K 5.3 (H) 06/18/2017 1006   CL 105 08/09/2021 0630   CO2 27 08/09/2021 0630   CO2 25 06/18/2017 1006   GLUCOSE 104 (H) 08/09/2021 0630   GLUCOSE 146 (H) 06/18/2017 1006   BUN 14 08/09/2021 0630   BUN 14.3 06/18/2017 1006   CREATININE 1.02 08/09/2021 0630   CREATININE 0.87  04/07/2021 1132   CREATININE 0.9 06/18/2017 1006   CALCIUM 9.3 08/09/2021 0630   CALCIUM 10.3 06/18/2017 1006   PROT 6.7 08/09/2021 0630   PROT 7.6 06/18/2017 1006   ALBUMIN 3.1 (L) 08/09/2021 0630   ALBUMIN 4.0 06/18/2017 1006   AST 13 (L) 08/09/2021 0630   AST 12 (L) 04/07/2021 1132   AST 15 06/18/2017 1006   ALT 13 08/09/2021 0630   ALT 15 04/07/2021 1132  ALT 17 06/18/2017 1006   ALKPHOS 65 08/09/2021 0630   ALKPHOS 63 06/18/2017 1006   BILITOT 0.6 08/09/2021 0630   BILITOT 0.3 04/07/2021 1132   BILITOT 0.35 06/18/2017 1006   GFRNONAA >60 08/09/2021 0630   GFRNONAA >60 04/07/2021 1132   GFRAA >60 07/10/2019 1037      Lab Results  Component Value Date   TSH 0.708 08/05/2015     RADIOGRAPHY: NM PET Image Initial (PI) Skull Base To Thigh  Result Date: 08/27/2021 CLINICAL DATA:  Initial treatment strategy for laryngeal carcinoma. History of non-small cell lung cancer status post chemo radiation. EXAM: NUCLEAR MEDICINE PET SKULL BASE TO THIGH TECHNIQUE: 5.4 mCi F-18 FDG was injected intravenously. Full-ring PET imaging was performed from the skull base to thigh after the radiotracer. CT data was obtained and used for attenuation correction and anatomic localization. Fasting blood glucose: 120 mg/dl COMPARISON:  Neck CT 07/22/2021 and prior chest CT 04/07/2021 FINDINGS: Mediastinal blood pool activity: SUV max 2.37 Liver activity: SUV max NA NECK: Subtle soft tissue thickening along the anterior aspect of the larynx with mild hypermetabolism and SUV max of 5.91. Is also mild asymmetric hypermetabolism involving the right true cord region. Large necrotic level 2-3 nodal mass in the left neck is hypermetabolic with SUV max of 58.59 and consistent with metastatic adenopathy. No contralateral neck adenopathy or supraclavicular adenopathy. Incidental CT findings: none CHEST: Stable surgical and radiation changes involving the left hilum and left paramediastinal lung. No areas of  hypermetabolism to suggest residual or recurrent tumor. No enlarged or hypermetabolic mediastinal or hilar lymph nodes. No worrisome pulmonary nodules to suggest pulmonary metastatic disease. Incidental CT findings: Stable advanced vascular calcifications. Small amount of pericardial fluid. ABDOMEN/PELVIS: No findings to suggest abdominal/pelvic metastatic disease. No hepatic or adrenal gland lesions. No enlarged or hypermetabolic abdominal or pelvic lymph nodes. Incidental CT findings: Aortoiliac stent graft in good position without obvious complicating features. Surrounding aneurysm is stable. Cholelithiasis. Multiple left renal cysts and left renal calculi. Moderately thick walled bladder likely due to partial bladder outlet obstruction from an enlarged prostate gland with median lobe hypertrophy impressing on the base of the bladder. Left inguinal hernia contains small bowel loops but no findings for obstruction or incarceration. SKELETON: No findings to suggest osseous metastatic disease. Incidental CT findings: none IMPRESSION: 1. Small hypermetabolic anterior laryngeal lesion and associated partially necrotic left neck adenopathy consistent with known cancer. 2. No findings for metastatic disease involving the chest, abdomen, pelvis or bony structures. 3. Remote surgical and radiation changes from left lung cancer but no findings to suggest recurrent tumor. 4. Incidental findings as detailed above. Electronically Signed   By: Marijo Sanes M.D.   On: 08/27/2021 16:09      IMPRESSION/PLAN:  This is a delightful patient with previous history of lung cancer with brain metastases, now in remission (adenocarcinoma). Now with new dx of locally advanced laryngeal head and neck cancer (squamous cell carcinoma, glottis).  I recommend radiotherapy for this patient with possible concurrent chemotherapy.  We discusses pros and cons of curative treatment and options of no treatment and palliative treatment as well.  After a lengthy discussion, he would like to proceed with standard radiation therapy over 7 weeks with curative intent.  We discussed the potential risks, benefits, and side effects of radiotherapy. We talked in detail about acute and late effects. We discussed that some of the most bothersome acute effects may be mucositis, dysgeusia, salivary changes, skin irritation, hair loss, dehydration, weight loss  and fatigue. We talked about late effects which include but are not necessarily limited to dysphagia, hypothyroidism, nerve injury, vascular injury, spinal cord injury, xerostomia, trismus, neck edema, and potential injury to any of the tissues in the head and neck region. No guarantees of treatment were given. A consent form was signed and placed in the patient's medical record. The patient is enthusiastic about proceeding with treatment. I look forward to participating in the patient's care.    Simulation (treatment planning) will take place today, treatment 1/18.  We also discussed that the treatment of head and neck cancer is a multidisciplinary process to maximize treatment outcomes and quality of life. For this reason the following referrals have been or will be made:   Medical oncology to discuss chemotherapy    Dentistry for dental evaluation, possible extractions in the radiation fields, and /or advice on reducing risk of cavities, osteoradionecrosis, or other oral issues.   Nutritionist for nutrition support during and after treatment. I do not think a PEG tube is essential, and I am okay with using a wait and see approach, but he understands a tube will be needed if there is extreme weight loss.   Speech language pathology for swallowing and/or speech therapy.   Social work for social support.    Physical therapy due to risk of lymphedema in neck and deconditioning.   Baseline labs including TSH.  MBSS due to baseline swallowing issues.  I asked the patient today about tobacco  use. The patient uses tobacco.  I advised the patient to quit. Services were offered by me today including outpatient counseling and pharmacotherapy. I assessed for the willingness to attempt to quit and provided encouragement and demonstrated willingness to make referrals and/or prescriptions to help the patient attempt to quit. The patient has follow-up with the oncologic team to touch base on their tobacco use and /or cessation efforts.  Over 3 minutes were spent on this issue. At this time, he will think about our discussion, understanding that tobacco cessation is critical to give him the best possible prognosis. He understands his cancer was likely caused by smoking. He will let me know if/when he plans to quit.   On date of service, in total, I spent 60 minutes on this encounter. Patient was seen in person.  __________________________________________   Jordan Gibson, MD  This document serves as a record of services personally performed by Jordan Gibson, MD. It was created on her behalf by Roney Mans, a trained medical scribe. The creation of this record is based on the scribe's personal observations and the provider's statements to them. This document has been checked and approved by the attending provider.

## 2021-08-29 ENCOUNTER — Ambulatory Visit
Admission: RE | Admit: 2021-08-29 | Discharge: 2021-08-29 | Disposition: A | Discharge status: Home / Self Care | Payer: Medicare Other | Source: Ambulatory Visit | Attending: Radiation Oncology | Admitting: Radiation Oncology

## 2021-08-29 ENCOUNTER — Other Ambulatory Visit: Payer: Self-pay

## 2021-08-29 ENCOUNTER — Encounter: Payer: Self-pay | Admitting: Radiation Oncology

## 2021-08-29 ENCOUNTER — Ambulatory Visit: Payer: Medicare Other

## 2021-08-29 VITALS — BP 157/90 | HR 86 | Temp 97.3°F | Resp 18 | Ht 64.0 in | Wt 139.5 lb

## 2021-08-29 DIAGNOSIS — Z801 Family history of malignant neoplasm of trachea, bronchus and lung: Secondary | ICD-10-CM | POA: Insufficient documentation

## 2021-08-29 DIAGNOSIS — Z85118 Personal history of other malignant neoplasm of bronchus and lung: Secondary | ICD-10-CM | POA: Diagnosis not present

## 2021-08-29 DIAGNOSIS — I252 Old myocardial infarction: Secondary | ICD-10-CM | POA: Insufficient documentation

## 2021-08-29 DIAGNOSIS — I739 Peripheral vascular disease, unspecified: Secondary | ICD-10-CM | POA: Insufficient documentation

## 2021-08-29 DIAGNOSIS — I10 Essential (primary) hypertension: Secondary | ICD-10-CM | POA: Diagnosis not present

## 2021-08-29 DIAGNOSIS — Z8616 Personal history of COVID-19: Secondary | ICD-10-CM | POA: Insufficient documentation

## 2021-08-29 DIAGNOSIS — Z923 Personal history of irradiation: Secondary | ICD-10-CM | POA: Insufficient documentation

## 2021-08-29 DIAGNOSIS — C320 Malignant neoplasm of glottis: Secondary | ICD-10-CM | POA: Insufficient documentation

## 2021-08-29 DIAGNOSIS — K219 Gastro-esophageal reflux disease without esophagitis: Secondary | ICD-10-CM | POA: Insufficient documentation

## 2021-08-29 DIAGNOSIS — I2510 Atherosclerotic heart disease of native coronary artery without angina pectoris: Secondary | ICD-10-CM | POA: Insufficient documentation

## 2021-08-29 DIAGNOSIS — F17210 Nicotine dependence, cigarettes, uncomplicated: Secondary | ICD-10-CM | POA: Diagnosis not present

## 2021-08-29 DIAGNOSIS — C760 Malignant neoplasm of head, face and neck: Secondary | ICD-10-CM

## 2021-08-29 DIAGNOSIS — I714 Abdominal aortic aneurysm, without rupture, unspecified: Secondary | ICD-10-CM | POA: Diagnosis not present

## 2021-08-29 DIAGNOSIS — Z7982 Long term (current) use of aspirin: Secondary | ICD-10-CM | POA: Insufficient documentation

## 2021-08-29 DIAGNOSIS — G40909 Epilepsy, unspecified, not intractable, without status epilepticus: Secondary | ICD-10-CM | POA: Diagnosis not present

## 2021-08-29 DIAGNOSIS — Z79899 Other long term (current) drug therapy: Secondary | ICD-10-CM | POA: Insufficient documentation

## 2021-08-29 DIAGNOSIS — Z8042 Family history of malignant neoplasm of prostate: Secondary | ICD-10-CM | POA: Insufficient documentation

## 2021-08-29 DIAGNOSIS — K8020 Calculus of gallbladder without cholecystitis without obstruction: Secondary | ICD-10-CM | POA: Insufficient documentation

## 2021-08-29 DIAGNOSIS — Z510 Encounter for antineoplastic radiation therapy: Secondary | ICD-10-CM | POA: Diagnosis not present

## 2021-08-29 DIAGNOSIS — K760 Fatty (change of) liver, not elsewhere classified: Secondary | ICD-10-CM | POA: Diagnosis not present

## 2021-08-29 DIAGNOSIS — R490 Dysphonia: Secondary | ICD-10-CM | POA: Diagnosis not present

## 2021-08-29 DIAGNOSIS — N400 Enlarged prostate without lower urinary tract symptoms: Secondary | ICD-10-CM | POA: Diagnosis not present

## 2021-08-29 DIAGNOSIS — E785 Hyperlipidemia, unspecified: Secondary | ICD-10-CM | POA: Insufficient documentation

## 2021-08-29 MED ORDER — SODIUM CHLORIDE 0.9% FLUSH: 10.0000 mL | Freq: Once | INTRAVENOUS | Status: DC

## 2021-08-30 ENCOUNTER — Other Ambulatory Visit: Payer: Self-pay

## 2021-08-30 ENCOUNTER — Other Ambulatory Visit (HOSPITAL_COMMUNITY): Payer: Self-pay

## 2021-08-30 ENCOUNTER — Telehealth (HOSPITAL_COMMUNITY): Payer: Self-pay

## 2021-08-30 ENCOUNTER — Inpatient Hospital Stay: Payer: Medicare Other | Attending: Internal Medicine | Admitting: Internal Medicine

## 2021-08-30 ENCOUNTER — Encounter: Payer: Self-pay | Admitting: Internal Medicine

## 2021-08-30 DIAGNOSIS — R5381 Other malaise: Secondary | ICD-10-CM

## 2021-08-30 DIAGNOSIS — Z7982 Long term (current) use of aspirin: Secondary | ICD-10-CM | POA: Diagnosis not present

## 2021-08-30 DIAGNOSIS — R5383 Other fatigue: Secondary | ICD-10-CM

## 2021-08-30 DIAGNOSIS — Z79899 Other long term (current) drug therapy: Secondary | ICD-10-CM | POA: Insufficient documentation

## 2021-08-30 DIAGNOSIS — Z5111 Encounter for antineoplastic chemotherapy: Secondary | ICD-10-CM | POA: Insufficient documentation

## 2021-08-30 DIAGNOSIS — C770 Secondary and unspecified malignant neoplasm of lymph nodes of head, face and neck: Secondary | ICD-10-CM | POA: Diagnosis not present

## 2021-08-30 DIAGNOSIS — Z85118 Personal history of other malignant neoplasm of bronchus and lung: Secondary | ICD-10-CM | POA: Diagnosis not present

## 2021-08-30 DIAGNOSIS — Z923 Personal history of irradiation: Secondary | ICD-10-CM | POA: Insufficient documentation

## 2021-08-30 DIAGNOSIS — C329 Malignant neoplasm of larynx, unspecified: Secondary | ICD-10-CM | POA: Diagnosis not present

## 2021-08-30 DIAGNOSIS — C320 Malignant neoplasm of glottis: Secondary | ICD-10-CM

## 2021-08-30 DIAGNOSIS — F17210 Nicotine dependence, cigarettes, uncomplicated: Secondary | ICD-10-CM | POA: Insufficient documentation

## 2021-08-30 DIAGNOSIS — Z9221 Personal history of antineoplastic chemotherapy: Secondary | ICD-10-CM | POA: Insufficient documentation

## 2021-08-30 DIAGNOSIS — R1310 Dysphagia, unspecified: Secondary | ICD-10-CM

## 2021-08-30 NOTE — Patient Instructions (Signed)
Steps to Quit Smoking Smoking tobacco is the leading cause of preventable death. It can affect almost every organ in the body. Smoking puts you and people around you at risk for many serious, long-lasting (chronic) diseases. Quitting smoking can be hard, but it is one of the best things that you can do for your health. It is never too late to quit. How do I get ready to quit? When you decide to quit smoking, make a plan to help you succeed. Before you quit: Pick a date to quit. Set a date within the next 2 weeks to give you time to prepare. Write down the reasons why you are quitting. Keep this list in places where you will see it often. Tell your family, friends, and co-workers that you are quitting. Their support is important. Talk with your doctor about the choices that may help you quit. Find out if your health insurance will pay for these treatments. Know the people, places, things, and activities that make you want to smoke (triggers). Avoid them. What first steps can I take to quit smoking? Throw away all cigarettes at home, at work, and in your car. Throw away the things that you use when you smoke, such as ashtrays and lighters. Clean your car. Make sure to empty the ashtray. Clean your home, including curtains and carpets. What can I do to help me quit smoking? Talk with your doctor about taking medicines and seeing a counselor at the same time. You are more likely to succeed when you do both. If you are pregnant or breastfeeding, talk with your doctor about counseling or other ways to quit smoking. Do not take medicine to help you quit smoking unless your doctor tells you to do so. To quit smoking: Quit right away Quit smoking totally, instead of slowly cutting back on how much you smoke over a period of time. Go to counseling. You are more likely to quit if you go to counseling sessions regularly. Take medicine You may take medicines to help you quit. Some medicines need a  prescription, and some you can buy over-the-counter. Some medicines may contain a drug called nicotine to replace the nicotine in cigarettes. Medicines may: Help you to stop having the desire to smoke (cravings). Help to stop the problems that come when you stop smoking (withdrawal symptoms). Your doctor may ask you to use: Nicotine patches, gum, or lozenges. Nicotine inhalers or sprays. Non-nicotine medicine that is taken by mouth. Find resources Find resources and other ways to help you quit smoking and remain smoke-free after you quit. These resources are most helpful when you use them often. They include: Online chats with a counselor. Phone quitlines. Printed self-help materials. Support groups or group counseling. Text messaging programs. Mobile phone apps. Use apps on your mobile phone or tablet that can help you stick to your quit plan. There are many free apps for mobile phones and tablets as well as websites. Examples include Quit Guide from the CDC and smokefree.gov  What things can I do to make it easier to quit?  Talk to your family and friends. Ask them to support and encourage you. Call a phone quitline (1-800-QUIT-NOW), reach out to support groups, or work with a counselor. Ask people who smoke to not smoke around you. Avoid places that make you want to smoke, such as: Bars. Parties. Smoke-break areas at work. Spend time with people who do not smoke. Lower the stress in your life. Stress can make you want to   smoke. Try these things to help your stress: Getting regular exercise. Doing deep-breathing exercises. Doing yoga. Meditating. Doing a body scan. To do this, close your eyes, focus on one area of your body at a time from head to toe. Notice which parts of your body are tense. Try to relax the muscles in those areas. How will I feel when I quit smoking? Day 1 to 3 weeks Within the first 24 hours, you may start to have some problems that come from quitting tobacco.  These problems are very bad 2-3 days after you quit, but they do not often last for more than 2-3 weeks. You may get these symptoms: Mood swings. Feeling restless, nervous, angry, or annoyed. Trouble concentrating. Dizziness. Strong desire for high-sugar foods and nicotine. Weight gain. Trouble pooping (constipation). Feeling like you may vomit (nausea). Coughing or a sore throat. Changes in how the medicines that you take for other issues work in your body. Depression. Trouble sleeping (insomnia). Week 3 and afterward After the first 2-3 weeks of quitting, you may start to notice more positive results, such as: Better sense of smell and taste. Less coughing and sore throat. Slower heart rate. Lower blood pressure. Clearer skin. Better breathing. Fewer sick days. Quitting smoking can be hard. Do not give up if you fail the first time. Some people need to try a few times before they succeed. Do your best to stick to your quit plan, and talk with your doctor if you have any questions or concerns. Summary Smoking tobacco is the leading cause of preventable death. Quitting smoking can be hard, but it is one of the best things that you can do for your health. When you decide to quit smoking, make a plan to help you succeed. Quit smoking right away, not slowly over a period of time. When you start quitting, seek help from your doctor, family, or friends. This information is not intended to replace advice given to you by your health care provider. Make sure you discuss any questions you have with your health care provider. Document Revised: 04/15/2021 Document Reviewed: 10/26/2018 Elsevier Patient Education  2022 Elsevier Inc.  

## 2021-08-30 NOTE — Telephone Encounter (Signed)
Attempted to contact patient to schedule OP MBS - left voicemail. ?

## 2021-08-30 NOTE — Progress Notes (Signed)
[address]  Telephone:([phone] Fax:([phone]) [phone] OFFICE PROGRESS NOTE  Tammi Sou, MD [address] [place] [address]  DIAGNOSIS:  1) stage IVa (T2, N2, M0) laryngeal squamous cell carcinoma diagnosed in December 2022   2) Metastatic non-small cell lung cancer initially diagnosed as Stage IIIA (T2a., N2, M0) non-small cell lung cancer adenocarcinoma with negative EGFR mutation and negative ALK gene translocation involving the left upper lobe middle mediastinal lymphadenopathy diagnosed in August of 2014. He had metastatic disease to the brain diagnosed in December 2016.  Lung cancer   Primary site: Lung (Left)   Staging method: AJCC 7th Edition   Clinical: Stage IIIA (T2a, N2, M0) signed by [name], MD on 06/19/2013  5:06 PM   Summary: Stage IIIA (T2a, N2, M0).  Molecular biomarkers: Positive for [id number]*7, [id number] ATM splice site [id number]>YY TP53 E271K, SETD2 N136f17 and SPOP E47K                                          Negative for: RET, ALK, BRAF, KRAS, ERBB2, MET and EGFR.  PRIOR THERAPY:  1) Status post left upper lobectomy with mediastinal lymph node dissection on 05/29/2013. 2) Adjuvant chemotherapy with cisplatin at 75 mg per meter squared and Alimta at 500 mg per meter squared given every 3 weeks for a total of 4 cycles. First cycle given on 07/10/2013 3) curative radiotherapy to the mediastinum under the care of Dr. [name] on 12/12/2013. 4) status post stereotactic radiotherapy to the metastatic brain lesion on 08/25/2015 under the care of Dr. [name] CURRENT THERAPY: Concurrent chemoradiation with weekly cisplatin 40 Mg/M2.  First dose September 05, 2021.  INTERVAL HISTORY: Jordan ASMAR81.o. male returns to the clinic today for follow-up visit accompanied by his wife and daughter.  The patient is feeling fine today with no concerning complaints except for the recent hoarseness of his voice and the  swelling on the left side of the neck.  He was seen by ENT Dr. BaRedmond Basemannd was recently diagnosed with laryngeal squamous cell carcinoma with metastasis to the left cervical lymph node.  He had a PET scan performed recently that showed no evidence of metastatic disease outside the neck area.  He has no chest pain, shortness of breath, cough or hemoptysis.  He denied having any fever or chills.  He has no nausea, vomiting, diarrhea or constipation.  He has no headache or visual changes.  He has no recent weight loss or night sweats.  He was seen by Dr. SqIsidore Moosecently and expected to start curative radiotherapy next week.  He is here today for evaluation and discussion of his treatment options.  MEDICAL HISTORY: Past Medical History:  Diagnosis Date   AAA (abdominal aortic aneurysm)    s/p repair (aortoiliac bipass; with persistent endograft leak in inferior mesenteric artery)--stable as of 01/2017 vascular f/u.   Asymptomatic cholelithiasis 07/2015   Incidental finding on PET CT   Back pain 04/19/2016   BPH with elevated PSA    PSA signif rise 11/2017 (5.4 in 2015 to 31.21 November 2017. Bx 03/2018 benign.   Coronary artery disease    MI 1992, S/P  PTCA; negative stress test in November 2011 with no ischemia.    COVID-19 virus infection 12/07/2020   Diverticulosis    Elevated PSA 01/2018   01/22/18:  PSA  26.5.  Prostate MRI with abnormality->subsequent prostate bx BENIGN 04/12/18.PSA 72019 stbl at 24.1->rose to 36 03/2020->rec'd return to urol.   Gross hematuria 05/2018   Occurred 2+ mo's after prostate bx: attributed to bx by urol, treated empirically with keflex.  Urol initially suspected gross hem was sequela of recent prost bx. BUT recurrence of gross hem prompted full hematuria w/u 11/15/18.   Hearing loss    Bilateral    Hepatic steatosis    History of radiation therapy 11/10/13-12/12/13   lung,50Gy/32f   Hyperlipidemia    Crestor= myalgias   Hypertension    Laryngeal mass 07/2021   with L  cervical lymphadenopathy.  ENT->bx 07/29/21->invasive SCC   [place]   non-small cell;  L upper lobectomy with mediastinal LN dissection, chemo, and radiation in 2014/2015. Remission until pt had questionable seizure 07/2015--MRI showed brain mets; palliative brain rad (stereotactic radiation therapy) started 08/25/15.  CT C/A/P clear 02/2016, 05/2016, 11/2016, 12/2018. MRI brain 12/2018 stable lesions/no new mets. Rpt 6 mo per onc.   Malignant [place] of upper lobe, left bronchus or lung [place] 10/29/2013   *?[facility] 4.[facility]  Dr. MJulien Nordmann(onc) recommended f/u CXR after abx course.  If mass unchanged then repeat CT chest.  Surveillance CT chest and MRI brain 06/2018 show no sign of dz.   [facility] ([facility]   Dr MAngelena Form    Obstructive uropathy    obstructive and reflux uropathy   Peripheral vascular disease ([place] 08/2012   4.8x4.6 cm infrarenal abdominal aortic fusiform aneurysm, 1.5 cm right common iliac artery aneurysm.  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   Pneumonia 09/06/2018   [facility]   [facility]   Stereotactic radiation therapy: frontoparietal 18gy,posterior frontal lobe 20gy,   [place] disorder [place]   as sequela of brain mets;  Grand mal seizure 07/2015, then got on keppra and was seizure-free until focal motor seizures of L side of face began 02/2016- these responded well to up-titration of keppra but pt had adverse side effects so eventually pt had to be switched over to lamictal 07/2016--stable/seizure free on this med as of 09/2018 neuro f/u.   Tibial plateau fracture, right 03/2021   Tobacco dependence    "Quit" 1992, but pt has smoked "on and off" since that time   Unilateral vocal cord paralysis    left,  onset s/p mediastinoscopy with mediastinal LN resection 2014.    ALLERGIES:  is allergic to quinolones, wellbutrin [bupropion], rosuvastatin, and iodinated contrast media.  MEDICATIONS:  Current Outpatient Medications  Medication Sig Dispense Refill   amLODipine (NORVASC) 5 MG tablet TAKE 1 TABLET BY MOUTH EVERY DAY 90 tablet 1   aspirin 81 MG chewable tablet Chew 81 mg by mouth daily.     atorvastatin (LIPITOR) 20 MG tablet TAKE 1 TABLET BY MOUTH EVERY DAY 90 tablet 1   budesonide-formoterol (SYMBICORT) 160-4.5 MCG/ACT inhaler INHALE 1-2 PUFFS INTO THE LUNGS EVERY 12 HOURS. GARGLE AND SPIT AFTER USE. 10.2 each 11   diphenhydrAMINE (BENADRYL) 50 MG tablet Take 560mof Benadryl along with 5016mf Prednisone 2 hours before CT scan. (Patient not taking: Reported on 08/29/2021) 1 tablet 0   lamoTRIgine (LAMICTAL) 200 MG tablet Take 1 tablet in the morning and 1  and 1/2 tablets in the evening 225 tablet 3   metoprolol tartrate (LOPRESSOR) 50 MG tablet TAKE 1 TABLET BY MOUTH TWICE A DAY 180 tablet 0   pantoprazole (PROTONIX) 40 MG tablet TAKE 1 TABLET BY MOUTH EVERY DAY 90 tablet 0   predniSONE (DELTASONE) 50 MG tablet Take 25m prednisone 13 hours, then 571mprednisone 7 hours, then 5042mrednisone along with 69m26m Benadryl 2 hours before CT Scan. (Patient not taking: Reported on 08/29/2021) 3 tablet 0   tamsulosin (FLOMAX) 0.4 MG CAPS capsule Take 0.4 mg by mouth daily.     No current facility-administered medications for this visit.    SURGICAL HISTORY:  Past Surgical History:  Procedure Laterality Date   ABDOMINAL AORTIC ENDOVASCULAR STENT GRAFT N/A 05/07/2014   Procedure: ABDOMINAL AORTIC ENDOVASCULAR STENT GRAFT;  Surgeon: VancSerafina Mitchell;  [facility]  Service: Vascular;  Laterality: N/A;   Carotid duplex dopplers  07/2015   1-39% on R, no signif dz noted on L   COLONOSCOPY W/ POLYPECTOMY  2003    negative 2010,due 2020; Dr BrodOlevia PerchesORONARY ANGIOPLASTY     no stents    CYSTOSCOPY/RETROGRADE/URETEROSCOPY Bilateral 10/09/2012   Procedure: BILATERAL RETROGRADE bladder and urethral BIOPSY ;  Surgeon: DaniMolli Hazard;  [facility];  Service: Urology;  Laterality: Bilateral;  BILATERAL RETROGRADE    EEG  08/05/15   Pt placed on keppra just prior to this test due to having ? seizure (MRI showed brain mets)   HERNIA REPAIR Bilateral    Inguinal   INGUINAL HERIIORRHAPHY BILATERALLY     IR ANGIOGRAM SELECTIVE EACH ADDITIONAL VESSEL  12/03/2017   IR ANGIOGRAM VISCERAL SELECTIVE  12/03/2017   IR ANGIOGRAM VISCERAL SELECTIVE  12/03/2017   IR EMBO ARTERIAL NOT HEMORR HEMANG INC GUIDE ROADMAPPING  12/03/2017   IR GENERIC HISTORICAL  07/20/2016   IR RADIOLOGIST EVAL & MGMT 07/20/2016 [facility] INTER[facility] RAD   IR GENERIC HISTORICAL  08/02/2016   IR RADIOLOGIST EVAL & MGMT 08/02/2016 JohnSandi Mariscal [facility] INTER[facility] RAD   IR RADIOLOGIST EVAL & MGMT  11/08/2017   IR RADIOLOGIST EVAL & MGMT  01/01/2018   IR RADIOLOGIST EVAL & MGMT  08/27/2019   IR RADIOLOGIST EVAL & MGMT  09/16/2020   IR US [place] ACCESS RIGHT  12/03/2017   LARYNGOSCOPY AND ESOPHAGOSCOPY N/A 08/09/2021   Procedure: MICRO DIRECT LARYNGOSCOPY WITH BIOPSY AND ESOPHAGOSCOPY;  Surgeon: BateMelida Quitter;  [facility];  Laterality: N/A;   MEDIASTINOSCOPY N/A 05/01/2013   Procedure: MEDIASTINOSCOPY;  Surgeon: BryaGaye Pollack;  [facility]  Service: Thoracic;  Laterality: N/A;   PFTs  04/2013   Minimal obstructive airway disease   PILONIDAL CYST EXCISION     PROSTATE BIOPSY N/A 10/09/2012   NEG bx 04/12/18 as well.  Procedure: PROSTATIC URETHRAL BIOPSY--BPH--no evidence of malignancy;  Surgeon: DaniMolli Hazard;  [facility];  Service: Urology;  Laterality: N/A;  PROSTATIC URETHRAL BIOPSY   PROSTATE BIOPSY  03/2018   NEG   PTCA  1992   ROTATOR CUFF REPAIR     Bilateral   THOROCOTOMY WITH LOBECTOMY Left 05/29/2013   Procedure: LEFT THOROCOTOMY WITH LEFT UPPER LOBE  LOBECTOMY;  Surgeon: BryaGaye Pollack;  [facility];  Service: Thoracic;  Laterality: Left;   TRANSTHORACIC ECHOCARDIOGRAM  07/2015   EF 50-55%, hypokin of inf myoc, grd I DD, mild MR, mod dilat of LA.   VIDEO BRONCHOSCOPY  N/A 05/01/2013   Procedure: VIDEO BRONCHOSCOPY;  Surgeon: Gaye Pollack, MD;  [facility];  Service: Thoracic;  Laterality: N/A;    REVIEW OF SYSTEMS:  Constitutional: positive for fatigue Eyes: negative Ears, nose, mouth, throat, and face: positive for hoarseness Respiratory: negative Cardiovascular: negative Gastrointestinal: negative Genitourinary:negative Integument/breast: negative Hematologic/lymphatic: negative Musculoskeletal:negative Neurological: negative Behavioral/Psych: negative Endocrine: negative Allergic/Immunologic: negative   PHYSICAL EXAMINATION: General appearance: alert, cooperative, fatigued, and no distress Head: Normocephalic, without obvious abnormality, atraumatic Neck: marked anterior cervical adenopathy, no JVD, supple, symmetrical, trachea midline, and thyroid not enlarged, symmetric, no tenderness/mass/nodules Lymph nodes: Cervical adenopathy: Large left cervical lymph node Resp: clear to auscultation bilaterally Back: symmetric, no curvature. ROM normal. No CVA tenderness. Cardio: regular rate and rhythm, S1, S2 normal, no murmur, click, rub or gallop GI: soft, non-tender; bowel sounds normal; no masses,  no organomegaly Extremities: extremities normal, atraumatic, no cyanosis or edema Neurologic: Alert and oriented X 3, normal strength and tone. Normal symmetric reflexes. Normal coordination and gait  ECOG PERFORMANCE STATUS: 1 - Symptomatic but completely ambulatory  Blood pressure (!) 177/97, pulse (!) 108, temperature 98.3 F (36.8 C), temperature source Tympanic, resp. rate 18, weight 140 lb 0.3 oz (63.5 kg), SpO2 96 %.  LABORATORY DATA: Lab Results  Component Value Date   WBC 11.8 (H) 08/09/2021   HGB 14.3  08/09/2021   HCT 43.0 08/09/2021   MCV 89.0 08/09/2021   PLT 251 08/09/2021      Chemistry      Component Value Date/Time   NA 139 08/09/2021 0630   NA 141 06/18/2017 1006   K 4.0 08/09/2021 0630   K 5.3 (H) 06/18/2017 1006   CL 105 08/09/2021 0630   CO2 27 08/09/2021 0630   CO2 25 06/18/2017 1006   BUN 14 08/09/2021 0630   BUN 14.3 06/18/2017 1006   CREATININE 1.02 08/09/2021 0630   CREATININE 0.87 04/07/2021 1132   CREATININE 0.9 06/18/2017 1006      Component Value Date/Time   CALCIUM 9.3 08/09/2021 0630   CALCIUM 10.3 06/18/2017 1006   ALKPHOS 65 08/09/2021 0630   ALKPHOS 63 06/18/2017 1006   AST 13 (L) 08/09/2021 0630   AST 12 (L) 04/07/2021 1132   AST 15 06/18/2017 1006   ALT 13 08/09/2021 0630   ALT 15 04/07/2021 1132   ALT 17 06/18/2017 1006   BILITOT 0.6 08/09/2021 0630   BILITOT 0.3 04/07/2021 1132   BILITOT 0.35 06/18/2017 1006       RADIOGRAPHIC STUDIES: NM PET Image Initial (PI) Skull Base To Thigh  Result Date: 08/27/2021 CLINICAL DATA:  Initial treatment strategy for laryngeal carcinoma. History of non-small cell lung cancer status post chemo radiation. EXAM: NUCLEAR MEDICINE PET SKULL BASE TO THIGH TECHNIQUE: 5.4 mCi F-18 FDG was injected intravenously. Full-ring PET imaging was performed from the skull base to thigh after the radiotracer. CT data was obtained and used for attenuation correction and anatomic localization. Fasting blood glucose: 120 mg/dl COMPARISON:  Neck CT 07/22/2021 and prior chest CT 04/07/2021 FINDINGS: Mediastinal blood pool activity: SUV max 2.37 Liver activity: SUV max NA NECK: Subtle soft tissue thickening along the anterior aspect of the larynx with mild hypermetabolism and SUV max of 5.91. Is also mild asymmetric hypermetabolism involving the right true cord region. Large necrotic level 2-3 nodal mass in the left neck is hypermetabolic with SUV max of 95.09 and consistent with metastatic adenopathy. No contralateral neck adenopathy  or supraclavicular adenopathy. Incidental CT findings: none CHEST:  Stable surgical and radiation changes involving the left hilum and left paramediastinal lung. No areas of hypermetabolism to suggest residual or recurrent tumor. No enlarged or hypermetabolic mediastinal or hilar lymph nodes. No worrisome pulmonary nodules to suggest pulmonary metastatic disease. Incidental CT findings: Stable advanced vascular calcifications. Small amount of pericardial fluid. ABDOMEN/PELVIS: No findings to suggest abdominal/pelvic metastatic disease. No hepatic or adrenal gland lesions. No enlarged or hypermetabolic abdominal or pelvic lymph nodes. Incidental CT findings: Aortoiliac stent graft in good position without obvious complicating features. Surrounding aneurysm is stable. Cholelithiasis. Multiple left renal cysts and left renal calculi. Moderately thick walled bladder likely due to partial bladder outlet obstruction from an enlarged prostate gland with median lobe hypertrophy impressing on the base of the bladder. Left inguinal hernia contains small bowel loops but no findings for obstruction or incarceration. SKELETON: No findings to suggest osseous metastatic disease. Incidental CT findings: none IMPRESSION: 1. Small hypermetabolic anterior laryngeal lesion and associated partially necrotic left neck adenopathy consistent with known cancer. 2. No findings for metastatic disease involving the chest, abdomen, pelvis or bony structures. 3. Remote surgical and radiation changes from left lung cancer but no findings to suggest recurrent tumor. 4. Incidental findings as detailed above. Electronically Signed   By: Marijo Sanes M.D.   On: 08/27/2021 16:09    ASSESSMENT/PLAN:  This is a very pleasant 81 years old white male with  1) stage IVa (T2, N2, M0) laryngeal squamous cell carcinoma diagnosed in December 2022: I had a lengthy discussion with the patient and his wife and daughter today about his current disease stage,  prognosis and treatment options.  I discussed with the patient his prognosis with and without treatment.  We also discussed the treatment options including surgical resection but he will not be able to preserve his voice versus consideration of curative radiotherapy as a single modality versus a course of concurrent chemoradiation. After discussion of all the option and the adverse effects, the patient and his wife are interested in proceeding and treatment with a course of concurrent chemoradiation.  Because of his age and comorbidities, I would not consider the cisplatin every 3 weeks but I would consider him for weekly cisplatin 40 Mg/M2 during the 7 weeks of his treatment. I discussed with the patient the adverse effect of this treatment including but not limited to alopecia, myelosuppression, nausea and vomiting, peripheral neuropathy, hearing deficit as well as liver or renal dysfunction. The patient is expected to start the first cycle of this treatment next week. He will come back for follow-up visit in 2 weeks for evaluation and management of any adverse effect of his treatment. The patient may need a PEG tube placement for nutritional supplement during his treatment and this will be arranged by Dr. Isidore Moos and the ENT navigator. He may also benefit from referral to the palliative care clinic for his pain management during the treatment. 2) metastatic non-small cell lung cancer that was initially diagnosed as a stage IIIa adenocarcinoma status post left upper lobectomy with mediastinal lymph node dissection followed by adjuvant systemic chemotherapy with cisplatin and Alimta. The patient also received stereotactic radiotherapy to metastatic brain lesions in January 2017. The patient has been in observation since that time and he is feeling fine today with no concerning complaints.  His most recent PET scan showed no concerning findings for disease recurrence in the chest, abdomen and pelvis.  He  will continue on observation for this problem. The patient was advised to call immediately if  he has any concerning symptoms in the interval.  All questions were answered. The patient knows to call the clinic with any problems, questions or concerns. We can certainly see the patient much sooner if necessary.   Disclaimer: This note was dictated with voice recognition software. Similar sounding words can inadvertently be transcribed and may not be corrected upon review.

## 2021-08-30 NOTE — Progress Notes (Signed)
t

## 2021-08-30 NOTE — Progress Notes (Signed)
START ON [facility] - Head and Neck     A cycle is every 7 days:     Cisplatin   **Always confirm dose/schedule in your pharmacy ordering system**  Patient Characteristics: Larynx, Preoperative or Nonsurgical Candidate, Stage III - IVB Disease Classification: Larynx AJCC T Category: T2 AJCC 8 Stage Grouping: IVA Therapeutic Status: Preoperative or Nonsurgical Candidate AJCC N Category: cN2 AJCC M Category: M0 Intent of Therapy: Curative Intent, Discussed with Patient

## 2021-08-31 ENCOUNTER — Telehealth: Payer: Self-pay | Admitting: Internal Medicine

## 2021-08-31 ENCOUNTER — Encounter: Payer: Self-pay | Admitting: General Practice

## 2021-08-31 ENCOUNTER — Other Ambulatory Visit: Payer: Self-pay | Admitting: Interventional Radiology

## 2021-08-31 DIAGNOSIS — I9789 Other postprocedural complications and disorders of the circulatory system, not elsewhere classified: Secondary | ICD-10-CM

## 2021-08-31 NOTE — Telephone Encounter (Signed)
Scheduled appt per 1/10 los - called patient. No answer. Left message for patient with appt date and time

## 2021-08-31 NOTE — Telephone Encounter (Signed)
Scheduled appointment per 1/10 scheduling message. Left message.

## 2021-08-31 NOTE — Progress Notes (Signed)
Jordan Mccullough  Initial Assessment   Jordan Mccullough is a 81 y.o. year old male contacted by phone. Clinical Social Mccullough was referred by radiation oncology for assessment of psychosocial needs.   SDOH (Social Determinants of Health) assessments performed: Yes   Distress Screen completed: No ONCBCN DISTRESS SCREENING 06/30/2020  Screening Type Initial Screening  Distress experienced in past week (1-10) 0  Emotional problem type -  Information Concerns Type -      Family/Social Information:  Housing Arrangement: patient lives with wife in own home, daughter and son in [place], neighbors. Family members/support persons in your life? Family and Friends/Colleagues Transportation concerns: no, wife drives  Employment: Retired. Income source: Paediatric nurse concerns: No Type of concern: None Food access concerns: no Religious or spiritual practice: no Medication Concerns: no  Services Currently in place:  none  Coping/ Adjustment to diagnosis: Patient understands treatment plan and what happens next? yes, diagnosed with NSCLC Stage 3 in 2014, has been in treatment and monitoring for this condition.  Recently diagnosed w Stage IV laryngeal squamous cell carcinoma in Dec 2022, will begin radiation treatment for this condition.  Retired, used to being active, now limited in his activities.  Matter of fact approach to upcoming treatments, "will deal with whatever as it comes.'  Good support from wife, children and grandchildren.  No psychosocial concerns at this time.   Concerns about diagnosis and/or treatment: I'm not especially worried about anything Patient reported stressors:  none, "after a week of chemo and radiation Ill let you know" Hopes and priorities: grandchildren Patient enjoys exercise, time with family/ friends, and likes to walk, shoot guns, liked to fly Current coping skills/ strengths: Average or above average intelligence , Capable of  independent living , Communication skills , General fund of knowledge , and Supportive family/friends     SUMMARY: Current SDOH Barriers:  none  Interventions: Discussed common feeling and emotions when being diagnosed with cancer, and the importance of support during treatment Informed patient of the support team roles and support services at [facility] Provided CSW contact information and encouraged patient to call with any questions or concerns None needed, encouraged to request referral to social Mccullough if/when needed.    Follow Up Plan: Patient will contact CSW with any support or resource needs Patient verbalizes understanding of plan: Yes    Beverely Pace , [place], LCSW Clinical Social Worker Phone:  [phone]

## 2021-09-01 ENCOUNTER — Telehealth: Payer: Self-pay | Admitting: Internal Medicine

## 2021-09-01 ENCOUNTER — Other Ambulatory Visit: Payer: Self-pay | Admitting: Interventional Radiology

## 2021-09-01 DIAGNOSIS — I9789 Other postprocedural complications and disorders of the circulatory system, not elsewhere classified: Secondary | ICD-10-CM

## 2021-09-01 NOTE — Telephone Encounter (Signed)
Contacted patient to go over patients schedule. He is aware.

## 2021-09-05 ENCOUNTER — Other Ambulatory Visit: Payer: Self-pay | Admitting: *Deleted

## 2021-09-05 DIAGNOSIS — C320 Malignant neoplasm of glottis: Secondary | ICD-10-CM

## 2021-09-06 ENCOUNTER — Inpatient Hospital Stay: Payer: Medicare Other

## 2021-09-06 ENCOUNTER — Other Ambulatory Visit: Payer: Self-pay

## 2021-09-06 ENCOUNTER — Other Ambulatory Visit: Payer: Self-pay | Admitting: Internal Medicine

## 2021-09-06 ENCOUNTER — Telehealth: Payer: Self-pay

## 2021-09-06 VITALS — BP 127/73 | HR 68 | Temp 99.0°F | Resp 18 | Wt 140.0 lb

## 2021-09-06 DIAGNOSIS — Z85118 Personal history of other malignant neoplasm of bronchus and lung: Secondary | ICD-10-CM | POA: Diagnosis not present

## 2021-09-06 DIAGNOSIS — C770 Secondary and unspecified malignant neoplasm of lymph nodes of head, face and neck: Secondary | ICD-10-CM | POA: Diagnosis not present

## 2021-09-06 DIAGNOSIS — C3412 Malignant neoplasm of upper lobe, left bronchus or lung: Secondary | ICD-10-CM

## 2021-09-06 DIAGNOSIS — C320 Malignant neoplasm of glottis: Secondary | ICD-10-CM

## 2021-09-06 DIAGNOSIS — Z79899 Other long term (current) drug therapy: Secondary | ICD-10-CM | POA: Diagnosis not present

## 2021-09-06 DIAGNOSIS — C329 Malignant neoplasm of larynx, unspecified: Secondary | ICD-10-CM | POA: Diagnosis not present

## 2021-09-06 DIAGNOSIS — R5383 Other fatigue: Secondary | ICD-10-CM

## 2021-09-06 DIAGNOSIS — F17210 Nicotine dependence, cigarettes, uncomplicated: Secondary | ICD-10-CM | POA: Diagnosis not present

## 2021-09-06 DIAGNOSIS — Z7982 Long term (current) use of aspirin: Secondary | ICD-10-CM | POA: Diagnosis not present

## 2021-09-06 DIAGNOSIS — Z923 Personal history of irradiation: Secondary | ICD-10-CM | POA: Diagnosis not present

## 2021-09-06 DIAGNOSIS — Z9221 Personal history of antineoplastic chemotherapy: Secondary | ICD-10-CM | POA: Diagnosis not present

## 2021-09-06 DIAGNOSIS — Z5111 Encounter for antineoplastic chemotherapy: Secondary | ICD-10-CM | POA: Diagnosis not present

## 2021-09-06 LAB — CMP (CANCER CENTER ONLY)
ALT: 14 U/L (ref 0–44)
AST: 12 U/L — ABNORMAL LOW (ref 15–41)
Albumin: 3.9 g/dL (ref 3.5–5.0)
Alkaline Phosphatase: 73 U/L (ref 38–126)
Anion gap: 9 (ref 5–15)
BUN: 21 mg/dL (ref 8–23)
CO2: 23 mmol/L (ref 22–32)
Calcium: 9.7 mg/dL (ref 8.9–10.3)
Chloride: 106 mmol/L (ref 98–111)
Creatinine: 0.9 mg/dL (ref 0.61–1.24)
GFR, Estimated: >60 mL/min (ref >60)
Glucose, Bld: 109 mg/dL — ABNORMAL HIGH (ref 70–99)
Potassium: 4.2 mmol/L (ref 3.5–5.1)
Sodium: 138 mmol/L (ref 135–145)
Total Bilirubin: 0.4 mg/dL (ref 0.3–1.2)
Total Protein: 7.3 g/dL (ref 6.5–8.1)

## 2021-09-06 LAB — CBC WITH DIFFERENTIAL (CANCER CENTER ONLY)
Abs Immature Granulocytes: 0.05 10*3/uL (ref 0.00–0.07)
Basophils Absolute: 0.1 10*3/uL (ref 0.0–0.1)
Basophils Relative: 1 %
Eosinophils Absolute: 0.2 10*3/uL (ref 0.0–0.5)
Eosinophils Relative: 2 %
HCT: 41.7 % (ref 39.0–52.0)
Hemoglobin: 13.9 g/dL (ref 13.0–17.0)
Immature Granulocytes: 0 %
Lymphocytes Relative: 16 %
Lymphs Abs: 2.1 10*3/uL (ref 0.7–4.0)
MCH: 28.6 pg (ref 26.0–34.0)
MCHC: 33.3 g/dL (ref 30.0–36.0)
MCV: 85.8 fL (ref 80.0–100.0)
Monocytes Absolute: 1.1 10*3/uL — ABNORMAL HIGH (ref 0.1–1.0)
Monocytes Relative: 8 %
Neutro Abs: 9.3 10*3/uL — ABNORMAL HIGH (ref 1.7–7.7)
Neutrophils Relative %: 73 %
Platelet Count: 299 10*3/uL (ref 150–400)
RBC: 4.86 MIL/uL (ref 4.22–5.81)
RDW: 15 % (ref 11.5–15.5)
WBC Count: 12.8 10*3/uL — ABNORMAL HIGH (ref 4.0–10.5)
nRBC: 0 % (ref 0.0–0.2)

## 2021-09-06 LAB — TSH: TSH: 1.235 u[IU]/mL (ref 0.320–4.118)

## 2021-09-06 LAB — MAGNESIUM: Magnesium: 1.9 mg/dL (ref 1.7–2.4)

## 2021-09-06 MED ORDER — SODIUM CHLORIDE 0.9 % IV SOLN: Freq: Once | INTRAVENOUS | Status: AC

## 2021-09-06 MED ORDER — PROCHLORPERAZINE MALEATE 10 MG PO TABS: 10.0000 mg | ORAL_TABLET | Freq: Four times a day (QID) | ORAL | 0 refills | Status: AC | PRN

## 2021-09-06 MED ORDER — POTASSIUM CHLORIDE IN NACL 20-0.9 MEQ/L-% IV SOLN
Freq: Once | INTRAVENOUS | Status: AC
  Filled 2021-09-06: qty 1000

## 2021-09-06 MED ORDER — SODIUM CHLORIDE 0.9 % IV SOLN
150.0000 mg | Freq: Once | INTRAVENOUS | Status: AC
  Administered 2021-09-06: 150 mg via INTRAVENOUS
  Filled 2021-09-06: qty 150

## 2021-09-06 MED ORDER — PALONOSETRON HCL INJECTION 0.25 MG/5ML
0.2500 mg | Freq: Once | INTRAVENOUS | Status: AC
  Administered 2021-09-06: 0.25 mg via INTRAVENOUS
  Filled 2021-09-06: qty 5

## 2021-09-06 MED ORDER — SODIUM CHLORIDE 0.9 % IV SOLN
40.0000 mg/m2 | Freq: Once | INTRAVENOUS | Status: AC
  Administered 2021-09-06: 68 mg via INTRAVENOUS
  Filled 2021-09-06: qty 68

## 2021-09-06 MED ORDER — SODIUM CHLORIDE 0.9 % IV SOLN
10.0000 mg | Freq: Once | INTRAVENOUS | Status: AC
  Administered 2021-09-06: 10 mg via INTRAVENOUS
  Filled 2021-09-06: qty 10

## 2021-09-06 MED ORDER — MAGNESIUM SULFATE 2 GM/50ML IV SOLN
2.0000 g | Freq: Once | INTRAVENOUS | Status: AC
  Administered 2021-09-06: 2 g via INTRAVENOUS
  Filled 2021-09-06: qty 50

## 2021-09-06 NOTE — Progress Notes (Signed)
g

## 2021-09-06 NOTE — Patient Instructions (Signed)
[facility]  Discharge Instructions: Thank you for choosing [facility] to provide your oncology and hematology care.   If you have a lab appointment with the [facility], please go directly to the [facility] and check in at the registration area.   Wear comfortable clothing and clothing appropriate for easy access to any Portacath or PICC line.   We strive to give you quality time with your provider. You may need to reschedule your appointment if you arrive late (15 or more minutes).  Arriving late affects you and other patients whose appointments are after yours.  Also, if you miss three or more appointments without notifying the office, you may be dismissed from the clinic at the providers discretion.      For prescription refill requests, have your pharmacy contact our office and allow 72 hours for refills to be completed.    Today you received the following chemotherapy and/or immunotherapy agents Cisplatin      To help prevent nausea and vomiting after your treatment, we encourage you to take your nausea medication as directed.  BELOW ARE SYMPTOMS THAT SHOULD BE REPORTED IMMEDIATELY: *FEVER GREATER THAN 100.4 F (38 C) OR HIGHER *CHILLS OR SWEATING *NAUSEA AND VOMITING THAT IS NOT CONTROLLED WITH YOUR NAUSEA MEDICATION *UNUSUAL SHORTNESS OF BREATH *UNUSUAL BRUISING OR BLEEDING *URINARY PROBLEMS (pain or burning when urinating, or frequent urination) *BOWEL PROBLEMS (unusual diarrhea, constipation, pain near the anus) TENDERNESS IN MOUTH AND THROAT WITH OR WITHOUT PRESENCE OF ULCERS (sore throat, sores in mouth, or a toothache) UNUSUAL RASH, SWELLING OR PAIN  UNUSUAL VAGINAL DISCHARGE OR ITCHING   Items with * indicate a potential emergency and should be followed up as soon as possible or go to the Emergency Department if any problems should occur.  Please show the CHEMOTHERAPY ALERT CARD or IMMUNOTHERAPY ALERT CARD at check-in to  the Emergency Department and triage nurse.  Should you have questions after your visit or need to cancel or reschedule your appointment, please contact [facility]  Dept: [phone]  and follow the prompts.  Office hours are 8:00 a.m. to 4:30 p.m. Monday - Friday. Please note that voicemails left after 4:00 p.m. may not be returned until the following business day.  We are closed weekends and major holidays. You have access to a nurse at all times for urgent questions. Please call the main number to the clinic Dept: [phone] and follow the prompts.   For any non-urgent questions, you may also contact your provider using MyChart. We now offer e-Visits for anyone 77 and older to request care online for non-urgent symptoms. For details visit [email or web address].   Also download the MyChart app! Go to the app store, search "MyChart", open the app, select [facility], and log in with your MyChart username and password.  Due to Covid, a mask is required upon entering the hospital/clinic. If you do not have a mask, one will be given to you upon arrival. For doctor visits, patients may have 1 support person aged 74 or older with them. For treatment visits, patients cannot have anyone with them due to current Covid guidelines and our immunocompromised population.   Cisplatin injection What is this medication? CISPLATIN (SIS pla tin) is a chemotherapy drug. It targets fast dividing cells, like cancer cells, and causes these cells to die. This medicine is used to treat many types of cancer like bladder, ovarian, and testicular cancers. This medicine may be used  for other purposes; ask your health care provider or pharmacist if you have questions. COMMON BRAND NAME(S): Platinol, Platinol -AQ What should I tell my care team before I take this medication? They need to know if you have any of these conditions: eye disease, vision problems hearing problems kidney  disease low blood counts, like white cells, platelets, or red blood cells tingling of the fingers or toes, or other nerve disorder an unusual or allergic reaction to cisplatin, carboplatin, oxaliplatin, other medicines, foods, dyes, or preservatives pregnant or trying to get pregnant breast-feeding How should I use this medication? This drug is given as an infusion into a vein. It is administered in a hospital or clinic by a specially trained health care professional. Talk to your pediatrician regarding the use of this medicine in children. Special care may be needed. Overdosage: If you think you have taken too much of this medicine contact a poison control center or emergency room at once. NOTE: This medicine is only for you. Do not share this medicine with others. What if I miss a dose? It is important not to miss a dose. Call your doctor or health care professional if you are unable to keep an appointment. What may interact with this medication? This medicine may interact with the following medications: foscarnet certain antibiotics like amikacin, gentamicin, neomycin, polymyxin B, streptomycin, tobramycin, vancomycin This list may not describe all possible interactions. Give your health care provider a list of all the medicines, herbs, non-prescription drugs, or dietary supplements you use. Also tell them if you smoke, drink alcohol, or use illegal drugs. Some items may interact with your medicine. What should I watch for while using this medication? Your condition will be monitored carefully while you are receiving this medicine. You will need important blood work done while you are taking this medicine. This drug may make you feel generally unwell. This is not uncommon, as chemotherapy can affect healthy cells as well as cancer cells. Report any side effects. Continue your course of treatment even though you feel ill unless your doctor tells you to stop. This medicine may increase your  risk of getting an infection. Call your healthcare professional for advice if you get a fever, chills, or sore throat, or other symptoms of a cold or flu. Do not treat yourself. Try to avoid being around people who are sick. Avoid taking medicines that contain aspirin, acetaminophen, ibuprofen, naproxen, or ketoprofen unless instructed by your healthcare professional. These medicines may hide a fever. This medicine may increase your risk to bruise or bleed. Call your doctor or health care professional if you notice any unusual bleeding. Be careful brushing and flossing your teeth or using a toothpick because you may get an infection or bleed more easily. If you have any dental work done, tell your dentist you are receiving this medicine. Do not become pregnant while taking this medicine or for 14 months after stopping it. Women should inform their healthcare professional if they wish to become pregnant or think they might be pregnant. Men should not father a child while taking this medicine and for 11 months after stopping it. There is potential for serious side effects to an unborn child. Talk to your healthcare professional for more information. Do not breast-feed an infant while taking this medicine. This medicine has caused ovarian failure in some women. This medicine may make it more difficult to get pregnant. Talk to your healthcare professional if you are concerned about your fertility. This medicine has caused  decreased sperm counts in some men. This may make it more difficult to father a child. Talk to your healthcare professional if you are concerned about your fertility. Drink fluids as directed while you are taking this medicine. This will help protect your kidneys. Call your doctor or health care professional if you get diarrhea. Do not treat yourself. What side effects may I notice from receiving this medication? Side effects that you should report to your doctor or health care professional  as soon as possible: allergic reactions like skin rash, itching or hives, swelling of the face, lips, or tongue blurred vision changes in vision decreased hearing or ringing of the ears nausea, vomiting pain, redness, or irritation at site where injected pain, tingling, numbness in the hands or feet signs and symptoms of bleeding such as bloody or black, tarry stools; red or dark brown urine; spitting up blood or brown material that looks like coffee grounds; red spots on the skin; unusual bruising or bleeding from the eyes, gums, or nose signs and symptoms of infection like fever; chills; cough; sore throat; pain or trouble passing urine signs and symptoms of kidney injury like trouble passing urine or change in the amount of urine signs and symptoms of low red blood cells or anemia such as unusually weak or tired; feeling faint or lightheaded; falls; breathing problems Side effects that usually do not require medical attention (report to your doctor or health care professional if they continue or are bothersome): loss of appetite mouth sores muscle cramps This list may not describe all possible side effects. Call your doctor for medical advice about side effects. You may report side effects to FDA at 1-800-FDA-1088. Where should I keep my medication? This drug is given in a hospital or clinic and will not be stored at home. NOTE: This sheet is a summary. It may not cover all possible information. If you have questions about this medicine, talk to your doctor, pharmacist, or health care provider.  2022 Elsevier/Gold Standard (2021-04-26 00:00:00)

## 2021-09-06 NOTE — Progress Notes (Signed)
Ok to proceed with treatment without resulting magnesium labs per Dr. Julien Nordmann.

## 2021-09-06 NOTE — Telephone Encounter (Signed)
Pt requests rx for nausea.

## 2021-09-07 ENCOUNTER — Ambulatory Visit
Admission: RE | Admit: 2021-09-07 | Discharge: 2021-09-07 | Disposition: A | Discharge status: Home / Self Care | Payer: Medicare Other | Source: Ambulatory Visit | Attending: Radiation Oncology | Admitting: Radiation Oncology

## 2021-09-07 DIAGNOSIS — Z510 Encounter for antineoplastic radiation therapy: Secondary | ICD-10-CM | POA: Diagnosis not present

## 2021-09-07 DIAGNOSIS — C320 Malignant neoplasm of glottis: Secondary | ICD-10-CM | POA: Diagnosis not present

## 2021-09-07 NOTE — Progress Notes (Signed)
Oncology Nurse Navigator Documentation   To provide support, encouragement and care continuity, met with [name] for his initial RT.  He was accompanied by his wife. I reviewed the 2-step treatment process, answered questions.  [name] completed treatment without difficulty, denied questions/concerns. I reviewed the registration/arrival procedure for subsequent treatments. He is aware to come to the [facility] tomorrow at 10:30 to meet with the Physical Therapist and Swallowing Therapist and then have his radiation treatment.  I encouraged them to call me with questions/concerns as tmts proceed.   Harlow Asa RN, BSN, OCN Head & Neck Oncology Nurse [place] at [facility] Phone # [phone]  Fax # [phone]

## 2021-09-08 ENCOUNTER — Ambulatory Visit
Admission: RE | Admit: 2021-09-08 | Discharge: 2021-09-08 | Disposition: A | Discharge status: Home / Self Care | Payer: Medicare Other | Source: Ambulatory Visit | Attending: Radiation Oncology | Admitting: Radiation Oncology

## 2021-09-08 ENCOUNTER — Encounter: Payer: Self-pay | Admitting: Physical Therapy

## 2021-09-08 ENCOUNTER — Encounter: Payer: Self-pay | Admitting: Licensed Clinical Social Worker

## 2021-09-08 ENCOUNTER — Ambulatory Visit: Payer: Medicare Other | Attending: Radiation Oncology

## 2021-09-08 ENCOUNTER — Ambulatory Visit: Payer: Medicare Other | Attending: Radiation Oncology | Admitting: Physical Therapy

## 2021-09-08 ENCOUNTER — Other Ambulatory Visit: Payer: Self-pay

## 2021-09-08 DIAGNOSIS — R1312 Dysphagia, oropharyngeal phase: Secondary | ICD-10-CM | POA: Insufficient documentation

## 2021-09-08 DIAGNOSIS — C320 Malignant neoplasm of glottis: Secondary | ICD-10-CM | POA: Insufficient documentation

## 2021-09-08 DIAGNOSIS — Z510 Encounter for antineoplastic radiation therapy: Secondary | ICD-10-CM | POA: Diagnosis not present

## 2021-09-08 DIAGNOSIS — R293 Abnormal posture: Secondary | ICD-10-CM | POA: Diagnosis not present

## 2021-09-08 DIAGNOSIS — R490 Dysphonia: Secondary | ICD-10-CM | POA: Insufficient documentation

## 2021-09-08 DIAGNOSIS — R1310 Dysphagia, unspecified: Secondary | ICD-10-CM | POA: Insufficient documentation

## 2021-09-08 NOTE — Progress Notes (Signed)
Oncology Nurse Navigator Documentation   I met with Mr. Kimmons during his appointments with SLP and PT today. He did well after his first radiation appointment yesterday and denies concerns at this time. He and his wife know to call me if they have any questions or concerns.  Harlow Asa RN, BSN, OCN Head & Neck Oncology Nurse [place] at [facility] Phone # [phone]  Fax # [phone]

## 2021-09-08 NOTE — Therapy (Signed)
[facility] [address] [place], [place], [address] Phone: [phone]   Fax:  [phone]  Patient Details  Name: Jordan Mccullough MRN: [id number] Date of Birth: 10-21-1940 Referring Provider:  Eppie Gibson, MD  Encounter Date: 09/08/2021   ST Arrive-Cancel It was learned that pt has Modified Barium Swallow eval scheduled for 09-14-21. Therefore, SLP rescheduled hie ST evaluation for 09-15-21.    [place], [place] 09/08/2021, 12:11 PM  [facility] [address], [place], [place], [address] Phone: [phone]   Fax:  [phone]

## 2021-09-08 NOTE — Progress Notes (Signed)
[facility] CSW Progress Note  Holiday representative met with patient and wife in H&N clinic. Previous CSW assessment completed by CSW Webb Silversmith.  Pt reports still doing well, no resource needs at this time. May reach out regarding transportation if it becomes too much for his wife and if daughter can't help.    CSW informed patient of the support team and support services at [facility].  CSW provided contact information and encouraged patient to call with any questions or concerns.    Christeen Douglas , LCSW

## 2021-09-08 NOTE — Therapy (Signed)
[facility] [place], [place], [address] Phone: [phone]   Fax:  [phone]  Physical Therapy Evaluation  Patient Details  Name: Jordan Mccullough MRN: [id number] Date of Birth: [birth year] Referring Provider (PT): Isidore Moos   Encounter Date: 09/08/2021   PT End of Session - 09/08/21 0950     Visit Number 1    Number of Visits 2    Date for PT Re-Evaluation 11/03/21    PT Start Time 1696    PT Stop Time 7893    PT Time Calculation (min) 34 min    Activity Tolerance Patient tolerated treatment well    Behavior During Therapy [facility] for tasks assessed/performed             Past Medical History:  Diagnosis Date   AAA (abdominal aortic aneurysm)    s/p repair (aortoiliac bipass; with persistent endograft leak in inferior mesenteric artery)--stable as of 01/2017 vascular f/u.   Asymptomatic cholelithiasis 07/2015   Incidental finding on PET CT   Back pain 04/19/2016   BPH with elevated PSA    PSA signif rise 11/2017 (5.4 in 2015 to 31.21 November 2017. Bx 03/2018 benign.   Coronary artery disease    MI 1992, S/P  PTCA; negative stress test in November 2011 with no ischemia.    COVID-19 virus infection 12/07/2020   Diverticulosis    Elevated PSA 01/2018   01/22/18:  PSA 26.5.  Prostate MRI with abnormality->subsequent prostate bx BENIGN 04/12/18.PSA 72019 stbl at 24.1->rose to 36 03/2020->rec'd return to urol.   Gross hematuria 05/2018   Occurred 2+ mo's after prostate bx: attributed to bx by urol, treated empirically with keflex.  Urol initially suspected gross hem was sequela of recent prost bx. BUT recurrence of gross hem prompted full hematuria w/u 11/15/18.   Hearing loss    Bilateral    Hepatic steatosis    History of radiation therapy 11/10/13-12/12/13   lung,50Gy/48fx   Hyperlipidemia    Crestor= myalgias   Hypertension    Laryngeal mass 07/2021   with L cervical lymphadenopathy.  ENT->bx 07/29/21->invasive SCC    Lung cancer ([place]) 05/2013   non-small cell;  L upper lobectomy with mediastinal LN dissection, chemo, and radiation in 2014/2015. Remission until pt had questionable seizure 07/2015--MRI showed brain mets; palliative brain rad (stereotactic radiation therapy) started 08/25/15.  CT C/A/P clear 02/2016, 05/2016, 11/2016, 12/2018. MRI brain 12/2018 stable lesions/no new mets. Rpt 6 mo per onc.   Malignant neoplasm of upper lobe, left bronchus or lung ([place]) 10/29/2013   *?New spiculated 4.5cm mass in L upper lung field on CXR at [facility] in [place] 07/14/17.  Dr. Julien Nordmann (onc) recommended f/u CXR after abx course.  If mass unchanged then repeat CT chest.  Surveillance CT chest and MRI brain 06/2018 show no sign of dz.   Myocardial infarction [facility]) 1992   Dr Angelena Form     Obstructive uropathy    obstructive and reflux uropathy   Peripheral vascular disease ([name]) 08/2012   4.8x4.6 cm infrarenal abdominal aortic fusiform aneurysm, 1.5 cm right common iliac artery aneurysm.  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   Pneumonia 09/06/2018   XRAY was done at [facility].   S/P radiation therapy [facility] 08/25/15   Stereotactic radiation therapy: frontoparietal 18gy,posterior frontal lobe 20gy,   Seizure disorder ([place]) 2016/2017   as  sequela of brain mets;  Grand mal seizure 07/2015, then got on keppra and was seizure-free until focal motor seizures of L side of face began 02/2016- these responded well to up-titration of keppra but pt had adverse side effects so eventually pt had to be switched over to lamictal 07/2016--stable/seizure free on this med as of 09/2018 neuro f/u.   Tibial plateau fracture, right 03/2021   Tobacco dependence    "Quit" 1992, but pt has smoked "on and off" since that time   Unilateral vocal cord paralysis    left, onset s/p mediastinoscopy with mediastinal LN resection  2014.    Past Surgical History:  Procedure Laterality Date   ABDOMINAL AORTIC ENDOVASCULAR STENT GRAFT N/A 05/07/2014   Procedure: ABDOMINAL AORTIC ENDOVASCULAR STENT GRAFT;  Surgeon: Serafina Mitchell, MD;  Location: [facility];  Service: Vascular;  Laterality: N/A;   Carotid duplex dopplers  07/2015   1-39% on R, no signif dz noted on L   COLONOSCOPY W/ POLYPECTOMY  2003    negative 2010,due 2020; Dr Olevia Perches   CORONARY ANGIOPLASTY     no stents   CYSTOSCOPY/RETROGRADE/URETEROSCOPY Bilateral 10/09/2012   Procedure: BILATERAL RETROGRADE bladder and urethral BIOPSY ;  Surgeon: Molli Hazard, MD;  Location: [facility];  Service: Urology;  Laterality: Bilateral;  BILATERAL RETROGRADE    EEG  08/05/15   Pt placed on keppra just prior to this test due to having ? seizure (MRI showed brain mets)   HERNIA REPAIR Bilateral    Inguinal   INGUINAL HERIIORRHAPHY BILATERALLY     IR ANGIOGRAM SELECTIVE EACH ADDITIONAL VESSEL  12/03/2017   IR ANGIOGRAM VISCERAL SELECTIVE  12/03/2017   IR ANGIOGRAM VISCERAL SELECTIVE  12/03/2017   IR EMBO ARTERIAL NOT HEMORR HEMANG INC GUIDE ROADMAPPING  12/03/2017   IR GENERIC HISTORICAL  07/20/2016   IR RADIOLOGIST EVAL & MGMT 07/20/2016 [facility] INTERV RAD   IR GENERIC HISTORICAL  08/02/2016   IR RADIOLOGIST EVAL & MGMT 08/02/2016 Sandi Mariscal, MD [facility] INTERV RAD   IR RADIOLOGIST EVAL & MGMT  11/08/2017   IR RADIOLOGIST EVAL & MGMT  01/01/2018   IR RADIOLOGIST EVAL & MGMT  08/27/2019   IR RADIOLOGIST EVAL & MGMT  09/16/2020   IR US GUIDE VASC ACCESS RIGHT  12/03/2017   LARYNGOSCOPY AND ESOPHAGOSCOPY N/A 08/09/2021   Procedure: MICRO DIRECT LARYNGOSCOPY WITH BIOPSY AND ESOPHAGOSCOPY;  Surgeon: Melida Quitter, MD;  Location: [facility];  Service: ENT;  Laterality: N/A;   MEDIASTINOSCOPY N/A 05/01/2013   Procedure: MEDIASTINOSCOPY;  Surgeon: Gaye Pollack, MD;  Location: [facility];  Service: Thoracic;  Laterality: N/A;   PFTs  04/2013   Minimal obstructive airway disease   PILONIDAL  CYST EXCISION     PROSTATE BIOPSY N/A 10/09/2012   NEG bx 04/12/18 as well.  Procedure: PROSTATIC URETHRAL BIOPSY--BPH--no evidence of malignancy;  Surgeon: Molli Hazard, MD;  Location: [facility];  Service: Urology;  Laterality: N/A;  PROSTATIC URETHRAL BIOPSY   PROSTATE BIOPSY  03/2018   NEG   PTCA  1992   ROTATOR CUFF REPAIR     Bilateral   THOROCOTOMY WITH LOBECTOMY Left 05/29/2013   Procedure: LEFT THOROCOTOMY WITH LEFT UPPER LOBE LOBECTOMY;  Surgeon: Gaye Pollack, MD;  Location: [facility];  Service: Thoracic;  Laterality: Left;   TRANSTHORACIC ECHOCARDIOGRAM  07/2015   EF 50-55%, hypokin of inf myoc, grd I DD, mild MR, mod dilat of LA.   VIDEO BRONCHOSCOPY N/A 05/01/2013   Procedure: VIDEO BRONCHOSCOPY;  Surgeon: Gaye Pollack, MD;  Location: [facility];  Service: Thoracic;  Laterality: N/A;    There were no vitals filed for this visit.    Subjective Assessment - 09/08/21 0950     Subjective I feel perfect right now.    Pertinent History Glottis carcinoma, Stage IVA (T2, N2a, M0); 07/22/21 CT neck revealed an anterior larynx mass and necrotic left jugular chain adenopathy most consistent with SCC. 07/29/21 FNA completed by Dr. Redmond Baseman revealed [facility]. 08/09/21 further biopsies completed of the commissure mass under anesthesia revealed keratinizing SCC. 08/26/21 PET revealing the small hypermetabolic anterior laryngeal lesion, and associated partially necrotic left neck adenopathy consistent with known cancer. No findings for metastatic disease involving the chest, abdomen, pelvis or bony structures were appreciated. (Remote surgical and radiation changes were also seen from left lung cancer, but no findings to suggest recurrent tumor were appreciated). 07/20/21 PET revealing the left tonsillar mass as highly hypermetabolic, measuring up to about 5-7 cm in diameter. Hypermetabolic nodal involvement was also seen of the left level III, left level IV, right level IIb, bilateral level Ia, bilateral level  Ia, and right level VII nodes. In addition, a small focus of suspected tumor was seen in the left anterior inferior tongue. No findings of metastatic disease involving the chest, abdomen/pelvis, or skeleton were appreciated. Incidental findings included acute on chronic bilateral maxillary sinusitis, and three, likely benign, 2 mm right pulmonary nodules. Will receive 35 fractions of radiation to his Larynx and bilateral neck with weekly cisplatin (Dr. Julien Nordmann).  He started treatment on 09/06/21 (chemo) and 09/07/21 (radiation) and will complete on 10/25/21. History of lung cancer with left upper lobectomy 05/29/2013 with adjuvant chemotherapy starting 07/10/2013 every 3 weeks for 4 cycles. 12/12/2013 Radiation therapy (Dr. Lisbeth Renshaw) to mediastinum to high risk region for 50 Gy in 25 fractions. 08/25/2015 Stereotactic radiotherapy (Dr. Lisbeth Renshaw) to metastatic brain lesions.    Patient Stated Goals to gain info from providers    Currently in Pain? No/denies    Pain Score 0-No pain                [facility] PT Assessment - 09/08/21 0001       Assessment   Medical Diagnosis carcinoma of glottis    Referring Provider (PT) Isidore Moos    Onset Date/Surgical Date 07/29/21    Hand Dominance Right    Prior Therapy none      Precautions   Precautions Other (comment)    Precaution Comments active cancer; fractured knee 3-4 months ago; L sided weakness from brain radiation      Restrictions   Weight Bearing Restrictions No      Balance Screen   Has the patient fallen in the past 6 months Yes    How many times? 1   tripped on a step/threshold   Has the patient had a decrease in activity level because of a fear of falling?  Yes    Is the patient reluctant to leave their home because of a fear of falling?  No      Home Environment   Living Environment Private residence    Living Arrangements Spouse/significant other    Available Help at Discharge Family    Type of [place]      Prior Function   Level of  [place] Retired    Leisure very little, walks around by the pool      Cognition   Overall Cognitive Status Within Functional Limits for  tasks assessed      Observation/Other Assessments   Observations fullness in left side of neck      Functional Tests   Functional tests Sit to Stand      Sit to Stand   Comments 30 sec sit to stand: 16 reps which is good for his age      Posture/Postural Control   Posture/Postural Control Postural limitations    Postural Limitations Rounded Shoulders;Forward head;Increased thoracic kyphosis      ROM / Strength   AROM / PROM / Strength AROM      AROM   AROM Assessment Site Cervical    Cervical Flexion WFL    Cervical Extension 50% limited    Cervical - Right Side Bend WFL    Cervical - Left Side Bend 25% limited   limited by tumor   Cervical - Right Rotation WFL    Cervical - Left Rotation 50% limited   limited by tumor     Ambulation/Gait   Ambulation/Gait Yes    Ambulation/Gait Assistance 6: Modified independent (Device/Increase time)    Ambulation Distance (Feet) 10 Feet    Assistive device Straight cane    Gait Pattern Decreased arm swing - left               LYMPHEDEMA/ONCOLOGY QUESTIONNAIRE - 09/08/21 0001       Lymphedema Assessments   Lymphedema Assessments Head and Neck      Head and Neck   4 cm superior to sternal notch around neck 40.8 cm    6 cm superior to sternal notch around neck 43.9 cm    8 cm superior to sternal notch around neck 44.8 cm                     Objective measurements completed on examination: See above findings.                PT Education - 09/08/21 0950     Education Details Neck ROM, importance of posture when sitting, standing and lying down, deep breathing, walking program and importance of staying active throughout treatment, CURE article on staying active, "Why exercise?" flyer, lymphedema and PT info    Person(s) Educated Patient     Methods Explanation;Handout    Comprehension Verbalized understanding                 PT Long Term Goals - 09/08/21 0488       PT LONG TERM GOAL #1   Title Pt will return to baseline cervical ROM and not demonstrate any signs or symptoms of lymphedema.    Time 8    Period Weeks    Status New    Target Date 11/03/21                Head and Neck Clinic Goals - 09/08/21 8916       Patient will be able to verbalize understanding of a home exercise program for cervical range of motion, posture, and walking.          Time 1    Period Days    Status Achieved      Patient will be able to verbalize understanding of proper sitting and standing posture.          Time 1    Period Days    Status Achieved      Patient will be able to verbalize understanding of lymphedema risk and availability of treatment for this condition.  Time 1    Period Days    Status Achieved                Plan - 09/08/21 0951     Clinical Impression Statement Pt presents to PT with recently diagnosed carcinoma of glottis. He is undergoing chemo and radiation. He has a previous history of lung cancer and has received radiation to his lung and to metastatic lesions in his brain. Due to the radiation to his brain he has left sided weakness. Pt reports he now ambulates with a cane. He has had one recent fall where he tripped over a threshold. He does not exercise much. His neck ROM is limited by the tumor size. Educated pt about signs and symptoms of lymphedema as well as anatomy and physiology of lymphatic system. Educated pt in importance of staying as active as possible throughout treatment to decrease fatigue as well as head and neck ROM exercises to decrease loss of ROM. Will see pt after completion of radiation to reassess ROM and assess for lymphedema to determine therapy needs at that time.    Personal Factors and Comorbidities Age;Fitness;Comorbidity 2    Comorbidities lung  cancer, metastatic brain tumor treated with radiation, AAA, MI    Examination-Activity Limitations Stairs    Examination-Participation Restrictions Shop;Community Activity    Stability/Clinical Decision Making Stable/Uncomplicated    Clinical Decision Making Moderate    Rehab Potential Good    PT Frequency --   eval and 1 f/u visit   PT Duration 8 weeks    PT Treatment/Interventions ADLs/Self Care Home Management;Patient/family education;Therapeutic exercise    PT Next Visit Plan reassess baselines; PT for LE weakness?    PT Home Exercise Plan head and neck ROM exercises    Consulted and Agree with Plan of Care Patient             Patient will benefit from skilled therapeutic intervention in order to improve the following deficits and impairments:  Postural dysfunction, Decreased knowledge of precautions  Visit Diagnosis: Abnormal posture  Malignant neoplasm of glottis [facility])     Problem List Patient Active Problem List   Diagnosis Date Noted   Encounter for antineoplastic chemotherapy 08/30/2021   Glottis carcinoma ([place]) 08/29/2021   Head and neck cancer ([place]) 08/24/2021   Encounter for preoperative dental examination 08/24/2021   Teeth missing 08/24/2021   Caries 08/24/2021   Chronic apical periodontitis 08/24/2021   Accretions on teeth 08/24/2021   Chronic periodontitis 08/24/2021   Phobia of dental procedure 08/24/2021   COVID-19 12/10/2020   Dysphonia 06/05/2019   Paralysis of left vocal cord 06/05/2019   Back pain 04/19/2016   Seizure disorder ([place]) 04/03/2016   DNR (do not resuscitate)    Palliative care encounter    Complaints of leg weakness 11/08/2015   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 seizures, not intractable, without status epilepticus ([place]) 10/11/2015   Syncope 08/05/2015   Seizure-like activity ([place]) 08/04/2015   Brain metastases ([place]) 08/04/2015   Seizure ([place]) 08/04/2015   AAA (abdominal  aortic aneurysm) without rupture 06/21/2015   Aftercare following surgery of the circulatory system 06/21/2015   Essential hypertension 06/23/2014   Primary cancer of left upper lobe of lung ([place]) 06/19/2013   History of AAA (abdominal aortic aneurysm) repair 10/14/2012   Aneurysm of common iliac artery ([place]) 10/14/2012   Elevated PSA 07/10/2012   CAD (coronary artery disease) 07/31/2011   DIVERTICULOSIS, COLON 07/05/2010   COLONIC POLYPS 02/24/2010  AMBLYOPIA, HX OF 02/24/2010   HYPERGLYCEMIA, FASTING 05/15/2008   Hyperlipidemia 05/23/2007   MYOCARDIAL INFARCTION, HX OF 05/23/2007    Allyson Sabal Wagoner, PT 09/08/2021, 12:03 PM  [facility] @ [place], [place], [address] Phone: [phone]   Fax:  [phone]  Name: Jordan Mccullough MRN: [id number] Date of Birth: 1941-04-30

## 2021-09-09 ENCOUNTER — Telehealth: Payer: Self-pay | Admitting: Internal Medicine

## 2021-09-09 ENCOUNTER — Other Ambulatory Visit: Payer: Self-pay | Admitting: Internal Medicine

## 2021-09-09 ENCOUNTER — Ambulatory Visit
Admission: RE | Admit: 2021-09-09 | Discharge: 2021-09-09 | Disposition: A | Discharge status: Home / Self Care | Payer: Medicare Other | Source: Ambulatory Visit | Attending: Radiation Oncology | Admitting: Radiation Oncology

## 2021-09-09 DIAGNOSIS — C320 Malignant neoplasm of glottis: Secondary | ICD-10-CM | POA: Diagnosis not present

## 2021-09-09 DIAGNOSIS — C760 Malignant neoplasm of head, face and neck: Secondary | ICD-10-CM

## 2021-09-09 DIAGNOSIS — Z510 Encounter for antineoplastic radiation therapy: Secondary | ICD-10-CM | POA: Diagnosis not present

## 2021-09-09 MED FILL — Dexamethasone Sodium Phosphate Inj 100 MG/10ML: INTRAMUSCULAR | Qty: 1 | Status: AC

## 2021-09-09 MED FILL — Fosaprepitant Dimeglumine For IV Infusion 150 MG (Base Eq): INTRAVENOUS | Qty: 5 | Status: AC

## 2021-09-09 NOTE — Progress Notes (Signed)
[facility] OFFICE PROGRESS NOTE  Tammi Sou, MD [address] [place] [address]  DIAGNOSIS: 1) stage IVa (T2, N2, M0) laryngeal squamous cell carcinoma diagnosed in December 2022    2) Metastatic non-small cell lung cancer initially diagnosed as Stage IIIA (T2a., N2, M0) non-small cell lung cancer adenocarcinoma with negative EGFR mutation and negative ALK gene translocation involving the left upper lobe middle mediastinal lymphadenopathy diagnosed in August of 2014. He had metastatic disease to the brain diagnosed in December 2016.  Lung cancer   Primary site: Lung (Left)   Staging method: AJCC 7th Edition   Clinical: Stage IIIA (T2a, N2, M0) signed by [name], MD on 06/19/2013  5:06 PM   Summary: Stage IIIA (T2a, N2, M0).   Molecular biomarkers: Positive for [id number]*7, XJOI32 P498Y, ATM splice site [id number]>WK, TP53 E271K, SETD2 N188fs*17 and SPOP E47K                                          Negative for: RET, ALK, BRAF, KRAS, ERBB2, MET and EGFR.    PRIOR THERAPY: 1) Status post left upper lobectomy with mediastinal lymph node dissection on 05/29/2013. 2) Adjuvant chemotherapy with cisplatin at 75 mg per meter squared and Alimta at 500 mg per meter squared given every 3 weeks for a total of 4 cycles. First cycle given on 07/10/2013 3) curative radiotherapy to the mediastinum under the care of Dr. Lisbeth Renshaw completed on 12/12/2013. 4) status post stereotactic radiotherapy to the metastatic brain lesion on 08/25/2015 under the care of Dr. Lisbeth Renshaw.  CURRENT THERAPY:  Concurrent chemoradiation with weekly cisplatin 40 Mg/M2.  First dose September 06, 2021.  INTERVAL HISTORY: Jordan Mccullough 81 y.o. male returns to the clinic today for a follow-up visit accompanied by his wife and daughter. The patient was recently diagnosed with laryngeal squamous cell carcinoma.  He had a PET scan performed that did not show any evidence of metastatic disease  outside the neck area and no recurrence of lung cancer.  He is currently undergoing concurrent chemoradiation.  He underwent his first cycle of weekly chemotherapy with cisplatin last week and he tolerated it well without any concerning adverse side effects except for mild fatigue. He stated "so far so good".  He denies any fever, chills, or night sweats. His weight is stable. He is wondering if he should drink supplemental drinks. He notes he may feel nauseous if he eats a large amount. However, he rates his nausea 1/10 and has not needed to take his anti-emetic. Denies any chest pain, shortness of breath,  or hemoptysis.  He has a cough but reports it is secondary to his smoking. Denies any diarrhea. He has some constipation for which his wife bought miralax. Denies any headache or visual changes.  He sometimes with have a sharp fleeting pain in his head but it resolves spontaneously without any intervention. The patient is here today for evaluation and repeat blood work before undergoing cycle #2.  MEDICAL HISTORY: Past Medical History:  Diagnosis Date   AAA (abdominal aortic aneurysm)    s/p repair (aortoiliac bipass; with persistent endograft leak in inferior mesenteric artery)--stable as of 01/2017 vascular f/u.   Asymptomatic cholelithiasis 07/2015   Incidental finding on PET CT   Back pain 04/19/2016   BPH with elevated PSA    PSA signif rise 11/2017 (5.4 in  2015 to 31.21 November 2017. Bx 03/2018 benign.   Coronary artery disease    MI 1992, S/P  PTCA; negative stress test in November 2011 with no ischemia.    COVID-19 virus infection 12/07/2020   Diverticulosis    Elevated PSA 01/2018   01/22/18:  PSA 26.5.  Prostate MRI with abnormality->subsequent prostate bx BENIGN 04/12/18.PSA 72019 stbl at 24.1->rose to 36 03/2020->rec'd return to urol.   Gross hematuria 05/2018   Occurred 2+ mo's after prostate bx: attributed to bx by urol, treated empirically with keflex.  Urol initially suspected gross hem  was sequela of recent prost bx. BUT recurrence of gross hem prompted full hematuria w/u 11/15/18.   Hearing loss    Bilateral    Hepatic steatosis    History of radiation therapy 11/10/13-12/12/13   lung,50Gy/79fx   Hyperlipidemia    Crestor= myalgias   Hypertension    Laryngeal mass 07/2021   with L cervical lymphadenopathy.  ENT->bx 07/29/21->invasive SCC   Lung cancer ([place]) 05/2013   non-small cell;  L upper lobectomy with mediastinal LN dissection, chemo, and radiation in 2014/2015. Remission until pt had questionable seizure 07/2015--MRI showed brain mets; palliative brain rad (stereotactic radiation therapy) started 08/25/15.  CT C/A/P clear 02/2016, 05/2016, 11/2016, 12/2018. MRI brain 12/2018 stable lesions/no new mets. Rpt 6 mo per onc.   Malignant neoplasm of upper lobe, left bronchus or lung ([place]) 10/29/2013   *?New spiculated 4.5cm mass in L upper lung field on CXR at [facility] in [place] 07/14/17.  Dr. Julien Nordmann (onc) recommended f/u CXR after abx course.  If mass unchanged then repeat CT chest.  Surveillance CT chest and MRI brain 06/2018 show no sign of dz.   Myocardial infarction [employer] [facility]) 1992   Dr Angelena Form     Obstructive uropathy    obstructive and reflux uropathy   Peripheral vascular disease ([place]) 08/2012   4.8x4.6 cm infrarenal abdominal aortic fusiform aneurysm, 1.5 cm right common iliac artery aneurysm.  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   Pneumonia 09/06/2018   XRAY was done at [facility].   S/P radiation therapy [facility] 08/25/15   Stereotactic radiation therapy: frontoparietal 18gy,posterior frontal lobe 20gy,   Seizure disorder ([place]) 2016/2017   as sequela of brain mets;  Grand mal seizure 07/2015, then got on keppra and was seizure-free until focal motor seizures of L side of face began 02/2016- these responded well to up-titration of keppra but pt had  adverse side effects so eventually pt had to be switched over to lamictal 07/2016--stable/seizure free on this med as of 09/2018 neuro f/u.   Tibial plateau fracture, right 03/2021   Tobacco dependence    "Quit" 1992, but pt has smoked "on and off" since that time   Unilateral vocal cord paralysis    left, onset s/p mediastinoscopy with mediastinal LN resection 2014.    ALLERGIES:  is allergic to quinolones, wellbutrin [bupropion], rosuvastatin, and iodinated contrast media.  MEDICATIONS:  Current Outpatient Medications  Medication Sig Dispense Refill   aspirin 81 MG chewable tablet Chew 81 mg by mouth daily.     atorvastatin (LIPITOR) 20 MG tablet TAKE 1 TABLET BY MOUTH EVERY DAY 90 tablet 1   budesonide-formoterol (SYMBICORT) 160-4.5 MCG/ACT inhaler INHALE 1-2 PUFFS INTO THE LUNGS EVERY 12 HOURS. GARGLE AND SPIT AFTER USE. 10.2 each 11   lamoTRIgine (LAMICTAL) 200 MG tablet Take 1 tablet in  the morning and 1 and 1/2 tablets in the evening 225 tablet 3   metoprolol tartrate (LOPRESSOR) 50 MG tablet TAKE 1 TABLET BY MOUTH TWICE A DAY 180 tablet 0   prochlorperazine (COMPAZINE) 10 MG tablet Take 1 tablet (10 mg total) by mouth every 6 (six) hours as needed for nausea or vomiting. 30 tablet 0   tamsulosin (FLOMAX) 0.4 MG CAPS capsule Take 0.4 mg by mouth daily.     amLODipine (NORVASC) 5 MG tablet TAKE 1 TABLET BY MOUTH EVERY DAY 90 tablet 1   diphenhydrAMINE (BENADRYL) 50 MG tablet Take $RemoveBef'50mg'YkIqHzsVEg$  of Benadryl along with $RemoveBe'50mg'tOnQbknEn$  of Prednisone 2 hours before CT scan. (Patient not taking: Reported on 08/29/2021) 1 tablet 0   pantoprazole (PROTONIX) 40 MG tablet TAKE 1 TABLET BY MOUTH EVERY DAY 90 tablet 0   predniSONE (DELTASONE) 50 MG tablet Take $RemoveBef'50mg'fbmhIbUsMS$  prednisone 13 hours, then $RemoveBef'50mg'bvdJkjwVUy$  prednisone 7 hours, then $RemoveBef'50mg'QqESUCdMHi$  prednisone along with $RemoveBe'50mg'hcOZOXrUM$  of Benadryl 2 hours before CT Scan. (Patient not taking: Reported on 08/29/2021) 3 tablet 0   No current facility-administered medications for this visit.    Facility-Administered Medications Ordered in Other Visits  Medication Dose Route Frequency Provider Last Rate Last Admin   0.9 % NaCl with KCl 20 mEq/ L  infusion   Intravenous Once [name], MD       CISplatin (PLATINOL) 68 mg in sodium chloride 0.9 % 250 mL chemo infusion  40 mg/m2 (Treatment Plan Recorded) Intravenous Once [name], MD       dexamethasone (DECADRON) 10 mg in sodium chloride 0.9 % 50 mL IVPB  10 mg Intravenous Once [name], MD       fosaprepitant (EMEND) 150 mg in sodium chloride 0.9 % 145 mL IVPB  150 mg Intravenous Once [name], MD       magnesium sulfate IVPB 2 g 50 mL  2 g Intravenous Once [name], MD       palonosetron (ALOXI) injection 0.25 mg  0.25 mg Intravenous Once [name], MD        SURGICAL HISTORY:  Past Surgical History:  Procedure Laterality Date   ABDOMINAL AORTIC ENDOVASCULAR STENT GRAFT N/A 05/07/2014   Procedure: ABDOMINAL AORTIC ENDOVASCULAR STENT GRAFT;  Surgeon: Serafina Mitchell, MD;  Location: [facility];  Service: Vascular;  Laterality: N/A;   Carotid duplex dopplers  07/2015   1-39% on R, no signif dz noted on L   COLONOSCOPY W/ POLYPECTOMY  2003    negative 2010,due 2020; Dr Olevia Perches   CORONARY ANGIOPLASTY     no stents   CYSTOSCOPY/RETROGRADE/URETEROSCOPY Bilateral 10/09/2012   Procedure: BILATERAL RETROGRADE bladder and urethral BIOPSY ;  Surgeon: Molli Hazard, MD;  Location: [facility];  Service: Urology;  Laterality: Bilateral;  BILATERAL RETROGRADE    EEG  08/05/15   Pt placed on keppra just prior to this test due to having ? seizure (MRI showed brain mets)   HERNIA REPAIR Bilateral    Inguinal   INGUINAL HERIIORRHAPHY BILATERALLY     IR ANGIOGRAM SELECTIVE EACH ADDITIONAL VESSEL  12/03/2017   IR ANGIOGRAM VISCERAL SELECTIVE  12/03/2017   IR ANGIOGRAM VISCERAL SELECTIVE  12/03/2017   IR EMBO ARTERIAL NOT HEMORR HEMANG INC GUIDE ROADMAPPING  12/03/2017   IR GENERIC HISTORICAL  07/20/2016    IR RADIOLOGIST EVAL & MGMT 07/20/2016 [facility] INTERV RAD   IR GENERIC HISTORICAL  08/02/2016   IR RADIOLOGIST EVAL & MGMT 08/02/2016 Sandi Mariscal, MD [facility] INTERV RAD   IR RADIOLOGIST  EVAL & MGMT  11/08/2017   IR RADIOLOGIST EVAL & MGMT  01/01/2018   IR RADIOLOGIST EVAL & MGMT  08/27/2019   IR RADIOLOGIST EVAL & MGMT  09/16/2020   IR US GUIDE VASC ACCESS RIGHT  12/03/2017   LARYNGOSCOPY AND ESOPHAGOSCOPY N/A 08/09/2021   Procedure: MICRO DIRECT LARYNGOSCOPY WITH BIOPSY AND ESOPHAGOSCOPY;  Surgeon: Melida Quitter, MD;  Location: [facility];  Service: ENT;  Laterality: N/A;   MEDIASTINOSCOPY N/A 05/01/2013   Procedure: MEDIASTINOSCOPY;  Surgeon: Gaye Pollack, MD;  Location: [facility];  Service: Thoracic;  Laterality: N/A;   PFTs  04/2013   Minimal obstructive airway disease   PILONIDAL CYST EXCISION     PROSTATE BIOPSY N/A 10/09/2012   NEG bx 04/12/18 as well.  Procedure: PROSTATIC URETHRAL BIOPSY--BPH--no evidence of malignancy;  Surgeon: Molli Hazard, MD;  Location: [facility];  Service: Urology;  Laterality: N/A;  PROSTATIC URETHRAL BIOPSY   PROSTATE BIOPSY  03/2018   NEG   PTCA  1992   ROTATOR CUFF REPAIR     Bilateral   THOROCOTOMY WITH LOBECTOMY Left 05/29/2013   Procedure: LEFT THOROCOTOMY WITH LEFT UPPER LOBE LOBECTOMY;  Surgeon: Gaye Pollack, MD;  Location: [facility];  Service: Thoracic;  Laterality: Left;   TRANSTHORACIC ECHOCARDIOGRAM  07/2015   EF 50-55%, hypokin of inf myoc, grd I DD, mild MR, mod dilat of LA.   VIDEO BRONCHOSCOPY N/A 05/01/2013   Procedure: VIDEO BRONCHOSCOPY;  Surgeon: Gaye Pollack, MD;  Location: [facility];  Service: Thoracic;  Laterality: N/A;    REVIEW OF SYSTEMS:   Review of Systems  Constitutional: Negative for appetite change, chills, fatigue, fever and unexpected weight change.  HENT: Positive for large left neck mass. Negative for mouth sores, nosebleeds, sore throat and trouble swallowing.   Eyes: Negative for eye problems and icterus.  Respiratory: Positive  for baseline cough. Negative for hemoptysis, shortness of breath and wheezing.   Cardiovascular: Negative for chest pain and leg swelling.  Gastrointestinal: Negative for abdominal pain, constipation, diarrhea, nausea and vomiting.  Genitourinary: Negative for bladder incontinence, difficulty urinating, dysuria, frequency and hematuria.   Musculoskeletal: Negative for back pain, gait problem, neck pain and neck stiffness.  Skin: Negative for itching and rash.  Neurological: Negative for dizziness, extremity weakness, gait problem, headaches, light-headedness and seizures.  Hematological: Positive for large left cervical adenopathy. Does not bruise/bleed easily.  Psychiatric/Behavioral: Negative for confusion, depression and sleep disturbance. The patient is not nervous/anxious.     PHYSICAL EXAMINATION:  Blood pressure 138/74, pulse 77, temperature (!) 96.9 F (36.1 C), temperature source Tympanic, resp. rate 17, weight 141 lb 3.2 oz (64 kg), SpO2 97 %.  ECOG PERFORMANCE STATUS: 1  Physical Exam  Constitutional: Oriented to person, place, and time and well-developed, well-nourished, and in no distress.  HENT:  Head: Normocephalic and atraumatic.  Mouth/Throat: Oropharynx is clear and moist. No oropharyngeal exudate.  Eyes: Conjunctivae are normal. Right eye exhibits no discharge. Left eye exhibits no discharge. No scleral icterus.  Neck: Normal range of motion. Neck supple.  Cardiovascular: Normal rate, regular rhythm, normal heart sounds and intact distal pulses.   Pulmonary/Chest: Effort normal and breath sounds normal. No respiratory distress. No wheezes. No rales.  Abdominal: Soft. Bowel sounds are normal. Exhibits no distension and no mass. There is no tenderness.  Musculoskeletal: Normal range of motion. Exhibits no edema.  Lymphadenopathy:    Large left cervical adenopathy.  Neurological: Alert and oriented to person, place, and time. Exhibits normal  muscle tone. Gait normal.  Coordination normal.  Skin: Skin is warm and dry. No rash noted. Not diaphoretic. No erythema. No pallor.  Psychiatric: Mood, memory and judgment normal.  Vitals reviewed.  LABORATORY DATA: Lab Results  Component Value Date   WBC 10.3 09/12/2021   HGB 13.6 09/12/2021   HCT 39.0 09/12/2021   MCV 84.2 09/12/2021   PLT 260 09/12/2021      Chemistry      Component Value Date/Time   NA 137 09/12/2021 0732   NA 141 06/18/2017 1006   K 4.0 09/12/2021 0732   K 5.3 (H) 06/18/2017 1006   CL 105 09/12/2021 0732   CO2 25 09/12/2021 0732   CO2 25 06/18/2017 1006   BUN 19 09/12/2021 0732   BUN 14.3 06/18/2017 1006   CREATININE 0.98 09/12/2021 0732   CREATININE 0.9 06/18/2017 1006      Component Value Date/Time   CALCIUM 9.4 09/12/2021 0732   CALCIUM 10.3 06/18/2017 1006   ALKPHOS 67 09/12/2021 0732   ALKPHOS 63 06/18/2017 1006   AST 11 (L) 09/12/2021 0732   AST 15 06/18/2017 1006   ALT 11 09/12/2021 0732   ALT 17 06/18/2017 1006   BILITOT 0.5 09/12/2021 0732   BILITOT 0.35 06/18/2017 1006       RADIOGRAPHIC STUDIES:  NM PET Image Initial (PI) Skull Base To Thigh  Result Date: 08/27/2021 CLINICAL DATA:  Initial treatment strategy for laryngeal carcinoma. History of non-small cell lung cancer status post chemo radiation. EXAM: NUCLEAR MEDICINE PET SKULL BASE TO THIGH TECHNIQUE: 5.4 mCi F-18 FDG was injected intravenously. Full-ring PET imaging was performed from the skull base to thigh after the radiotracer. CT data was obtained and used for attenuation correction and anatomic localization. Fasting blood glucose: 120 mg/dl COMPARISON:  Neck CT 07/22/2021 and prior chest CT 04/07/2021 FINDINGS: Mediastinal blood pool activity: SUV max 2.37 Liver activity: SUV max NA NECK: Subtle soft tissue thickening along the anterior aspect of the larynx with mild hypermetabolism and SUV max of 5.91. Is also mild asymmetric hypermetabolism involving the right true cord region. Large necrotic level  2-3 nodal mass in the left neck is hypermetabolic with SUV max of 09.98 and consistent with metastatic adenopathy. No contralateral neck adenopathy or supraclavicular adenopathy. Incidental CT findings: none CHEST: Stable surgical and radiation changes involving the left hilum and left paramediastinal lung. No areas of hypermetabolism to suggest residual or recurrent tumor. No enlarged or hypermetabolic mediastinal or hilar lymph nodes. No worrisome pulmonary nodules to suggest pulmonary metastatic disease. Incidental CT findings: Stable advanced vascular calcifications. Small amount of pericardial fluid. ABDOMEN/PELVIS: No findings to suggest abdominal/pelvic metastatic disease. No hepatic or adrenal gland lesions. No enlarged or hypermetabolic abdominal or pelvic lymph nodes. Incidental CT findings: Aortoiliac stent graft in good position without obvious complicating features. Surrounding aneurysm is stable. Cholelithiasis. Multiple left renal cysts and left renal calculi. Moderately thick walled bladder likely due to partial bladder outlet obstruction from an enlarged prostate gland with median lobe hypertrophy impressing on the base of the bladder. Left inguinal hernia contains small bowel loops but no findings for obstruction or incarceration. SKELETON: No findings to suggest osseous metastatic disease. Incidental CT findings: none IMPRESSION: 1. Small hypermetabolic anterior laryngeal lesion and associated partially necrotic left neck adenopathy consistent with known cancer. 2. No findings for metastatic disease involving the chest, abdomen, pelvis or bony structures. 3. Remote surgical and radiation changes from left lung cancer but no findings to suggest recurrent tumor. 4. Incidental  findings as detailed above. Electronically Signed   By: Marijo Sanes M.D.   On: 08/27/2021 16:09     ASSESSMENT/PLAN:  This is a very pleasant 81 years old white male with  1) stage IVa (T2, N2, M0) laryngeal squamous  cell carcinoma diagnosed in December 2022:  Because of his age and comorbidities, Dr. Julien Nordmann would not consider the cisplatin every 3 weeks but he would consider him for weekly cisplatin 40 Mg/M2 during the 7 weeks of his treatment. Therefore, the patient is currently undergoing weekly cisplatin. He is status post 1 cycle and tolerated it well.   Labs were reviewed. Recommend that he proceed with cycle #2 today as scheduled.   He will come back for follow-up visit in 2 weeks for evaluation before starting cycle #4.   The patient is strongly encouraged to quit smoking.   The patient inquired about supplemental drinks.  Encouraged the patient to drink supplemental drinks in between meals to maintain his weight.  The patient states that he experiences very mild/minimal nausea only if he overeats.  Discussed alternatives include small frequent meals throughout the day versus trying to take his antiemetic prior to eating.  Constipation education was reviewed with the patient and his wife.  The patient was encouraged to drink plenty of fluids, be as active as possible, and eat fruits and vegetables to avoid the risk of constipation.  Encouraged him to take a stool softener to keep his bowels regular.  Discussed if he has not had a bowel movement in 2 or more days more than his normal bowel habits that is okay to take a laxative such as MiraLAX.   2) metastatic non-small cell lung cancer that was initially diagnosed as a stage IIIa adenocarcinoma status post left upper lobectomy with mediastinal lymph node dissection followed by adjuvant systemic chemotherapy with cisplatin and Alimta. The patient also received stereotactic radiotherapy to metastatic brain lesions in January 2017. The patient has been in observation since that time and he is feeling fine today with no concerning complaints.  His most recent PET scan showed no concerning findings for disease recurrence in the chest, abdomen and pelvis.  He  will continue on observation for this problem. The patient was advised to call immediately if he has any concerning symptoms in the interval.        No orders of the defined types were placed in this encounter.    The total time spent in the appointment was 20-29 minutes.   Nahome Bublitz L Loan [place], PA-C 09/12/21

## 2021-09-09 NOTE — Telephone Encounter (Signed)
Scheduled per los, patient has been called and notified. 

## 2021-09-12 ENCOUNTER — Ambulatory Visit: Payer: Medicare Other | Admitting: Podiatry

## 2021-09-12 ENCOUNTER — Inpatient Hospital Stay: Payer: Medicare Other

## 2021-09-12 ENCOUNTER — Inpatient Hospital Stay: Payer: Medicare Other | Admitting: Physician Assistant

## 2021-09-12 ENCOUNTER — Other Ambulatory Visit: Payer: Self-pay

## 2021-09-12 ENCOUNTER — Ambulatory Visit
Admission: RE | Admit: 2021-09-12 | Discharge: 2021-09-12 | Disposition: A | Discharge status: Home / Self Care | Payer: Medicare Other | Source: Ambulatory Visit | Attending: Radiation Oncology | Admitting: Radiation Oncology

## 2021-09-12 ENCOUNTER — Inpatient Hospital Stay: Payer: Medicare Other | Admitting: Nutrition

## 2021-09-12 VITALS — BP 138/74 | HR 77 | Temp 96.9°F | Resp 17 | Wt 141.2 lb

## 2021-09-12 DIAGNOSIS — C760 Malignant neoplasm of head, face and neck: Secondary | ICD-10-CM

## 2021-09-12 DIAGNOSIS — Z510 Encounter for antineoplastic radiation therapy: Secondary | ICD-10-CM | POA: Diagnosis not present

## 2021-09-12 DIAGNOSIS — Z5111 Encounter for antineoplastic chemotherapy: Secondary | ICD-10-CM

## 2021-09-12 DIAGNOSIS — C320 Malignant neoplasm of glottis: Secondary | ICD-10-CM | POA: Diagnosis not present

## 2021-09-12 DIAGNOSIS — C3412 Malignant neoplasm of upper lobe, left bronchus or lung: Secondary | ICD-10-CM

## 2021-09-12 LAB — CBC WITH DIFFERENTIAL (CANCER CENTER ONLY)
Abs Immature Granulocytes: 0.03 10*3/uL (ref 0.00–0.07)
Basophils Absolute: 0 10*3/uL (ref 0.0–0.1)
Basophils Relative: 0 %
Eosinophils Absolute: 0.2 10*3/uL (ref 0.0–0.5)
Eosinophils Relative: 2 %
HCT: 39 % (ref 39.0–52.0)
Hemoglobin: 13.6 g/dL (ref 13.0–17.0)
Immature Granulocytes: 0 %
Lymphocytes Relative: 13 %
Lymphs Abs: 1.3 10*3/uL (ref 0.7–4.0)
MCH: 29.4 pg (ref 26.0–34.0)
MCHC: 34.9 g/dL (ref 30.0–36.0)
MCV: 84.2 fL (ref 80.0–100.0)
Monocytes Absolute: 1 10*3/uL (ref 0.1–1.0)
Monocytes Relative: 10 %
Neutro Abs: 7.7 10*3/uL (ref 1.7–7.7)
Neutrophils Relative %: 75 %
Platelet Count: 260 10*3/uL (ref 150–400)
RBC: 4.63 MIL/uL (ref 4.22–5.81)
RDW: 14.9 % (ref 11.5–15.5)
WBC Count: 10.3 10*3/uL (ref 4.0–10.5)
nRBC: 0 % (ref 0.0–0.2)

## 2021-09-12 LAB — MAGNESIUM: Magnesium: 1.8 mg/dL (ref 1.7–2.4)

## 2021-09-12 LAB — CMP (CANCER CENTER ONLY)
ALT: 11 U/L (ref 0–44)
AST: 11 U/L — ABNORMAL LOW (ref 15–41)
Albumin: 3.7 g/dL (ref 3.5–5.0)
Alkaline Phosphatase: 67 U/L (ref 38–126)
Anion gap: 7 (ref 5–15)
BUN: 19 mg/dL (ref 8–23)
CO2: 25 mmol/L (ref 22–32)
Calcium: 9.4 mg/dL (ref 8.9–10.3)
Chloride: 105 mmol/L (ref 98–111)
Creatinine: 0.98 mg/dL (ref 0.61–1.24)
GFR, Estimated: >60 mL/min (ref >60)
Glucose, Bld: 130 mg/dL — ABNORMAL HIGH (ref 70–99)
Potassium: 4 mmol/L (ref 3.5–5.1)
Sodium: 137 mmol/L (ref 135–145)
Total Bilirubin: 0.5 mg/dL (ref 0.3–1.2)
Total Protein: 6.7 g/dL (ref 6.5–8.1)

## 2021-09-12 MED ORDER — MAGNESIUM SULFATE 2 GM/50ML IV SOLN
2.0000 g | Freq: Once | INTRAVENOUS | Status: AC
  Administered 2021-09-12: 2 g via INTRAVENOUS
  Filled 2021-09-12: qty 50

## 2021-09-12 MED ORDER — PALONOSETRON HCL INJECTION 0.25 MG/5ML
0.2500 mg | Freq: Once | INTRAVENOUS | Status: AC
  Administered 2021-09-12: 0.25 mg via INTRAVENOUS
  Filled 2021-09-12: qty 5

## 2021-09-12 MED ORDER — SONAFINE EX EMUL
1.0000 "application " | Freq: Two times a day (BID) | CUTANEOUS | Status: DC
  Administered 2021-09-12: 1 via TOPICAL

## 2021-09-12 MED ORDER — POTASSIUM CHLORIDE IN NACL 20-0.9 MEQ/L-% IV SOLN
Freq: Once | INTRAVENOUS | Status: AC
  Filled 2021-09-12: qty 1000

## 2021-09-12 MED ORDER — SODIUM CHLORIDE 0.9 % IV SOLN: Freq: Once | INTRAVENOUS | Status: AC

## 2021-09-12 MED ORDER — SODIUM CHLORIDE 0.9 % IV SOLN
40.0000 mg/m2 | Freq: Once | INTRAVENOUS | Status: AC
  Administered 2021-09-12: 68 mg via INTRAVENOUS
  Filled 2021-09-12: qty 68

## 2021-09-12 MED ORDER — SODIUM CHLORIDE 0.9 % IV SOLN
10.0000 mg | Freq: Once | INTRAVENOUS | Status: AC
  Administered 2021-09-12: 10 mg via INTRAVENOUS
  Filled 2021-09-12: qty 10

## 2021-09-12 MED ORDER — SODIUM CHLORIDE 0.9 % IV SOLN
150.0000 mg | Freq: Once | INTRAVENOUS | Status: AC
  Administered 2021-09-12: 150 mg via INTRAVENOUS
  Filled 2021-09-12: qty 150

## 2021-09-12 NOTE — Progress Notes (Signed)
81 year old male diagnosed with stage IV laryngeal cancer followed by Dr. Julien Nordmann and Dr. Isidore Moos.  Patient is receiving cisplatin every 7 days and radiation therapy.  Past medical history includes non-small cell lung cancer in 2014, metastatic disease in 2016, radiation, seizures, AAA, CAD, diverticulosis, hyperlipidemia, hypertension, alcohol, and tobacco usage.  Medications include Lipitor, Protonix, and prednisone.  Labs include glucose 130 and albumin 3.7.  Height: 5 feet 4 inches. Weight: 141.2 pounds. Usual body weight: 148 pounds per patient. BMI: 24.24. ECOG: 1.  Estimated nutrition needs: 1900-2100 cal, 90-100 g protein, greater than 2 L fluid.  Noted patient is holding off on feeding tube for now.  He lives with his wife who prepares most of the food.  He is questioning whether he should be consuming oral nutrition supplements.  Patient has lost approximately 5% from usual body weight (unsure of timeframe). He reports a very small amount of nausea which generally occurs if he eats too big of a meal.  He denies diarrhea.  He is struggling with some constipation and has recently purchased [employer].  Nutrition diagnosis: Predicted suboptimal energy intake related to cancer and associated treatments as evidenced by history or condition for which research shows inadequate oral intake.  Intervention: Educated on importance of increasing calories and protein and small meals and snacks daily. Recommended patient begin 1-2 cartons oral nutrition supplements daily between meals.  Provided samples. Reviewed foods high in calories and high in protein for meals and snacks.  Reviewed appropriate snacks. Educated patient about the importance of controlling nausea and taking antiemetics as needed. Encouraged increased hydration secondary to cisplatin and to assist with constipation. Nutrition fact sheets provided.  Questions were answered.  Teach back method used.  Contact information  given.  Monitoring, evaluation, goals: Patient will work to consume adequate calories, protein and fluids to minimize weight loss.  Next visit: Monday, January 30 during infusion with Joli.  **Disclaimer: This note was dictated with voice recognition software. Similar sounding words can inadvertently be transcribed and this note may contain transcription errors which may not have been corrected upon publication of note.**

## 2021-09-12 NOTE — Patient Instructions (Signed)
[facility] MEDICAL ONCOLOGY  Discharge Instructions: Thank you for choosing [facility] to provide your oncology and hematology care.   If you have a lab appointment with the Cancer Center, please go directly to the Cancer Center and check in at the registration area.   Wear comfortable clothing and clothing appropriate for easy access to any Portacath or PICC line.   We strive to give you quality time with your provider. You may need to reschedule your appointment if you arrive late (15 or more minutes).  Arriving late affects you and other patients whose appointments are after yours.  Also, if you miss three or more appointments without notifying the office, you may be dismissed from the clinic at the provider's discretion.      For prescription refill requests, have your pharmacy contact our office and allow 72 hours for refills to be completed.    Today you received the following chemotherapy and/or immunotherapy agents cisplatin   To help prevent nausea and vomiting after your treatment, we encourage you to take your nausea medication as directed.  BELOW ARE SYMPTOMS THAT SHOULD BE REPORTED IMMEDIATELY: *FEVER GREATER THAN 100.4 F (38 C) OR HIGHER *CHILLS OR SWEATING *NAUSEA AND VOMITING THAT IS NOT CONTROLLED WITH YOUR NAUSEA MEDICATION *UNUSUAL SHORTNESS OF BREATH *UNUSUAL BRUISING OR BLEEDING *URINARY PROBLEMS (pain or burning when urinating, or frequent urination) *BOWEL PROBLEMS (unusual diarrhea, constipation, pain near the anus) TENDERNESS IN MOUTH AND THROAT WITH OR WITHOUT PRESENCE OF ULCERS (sore throat, sores in mouth, or a toothache) UNUSUAL RASH, SWELLING OR PAIN  UNUSUAL VAGINAL DISCHARGE OR ITCHING   Items with * indicate a potential emergency and should be followed up as soon as possible or go to the Emergency Department if any problems should occur.  Please show the CHEMOTHERAPY ALERT CARD or IMMUNOTHERAPY ALERT CARD at check-in to the  Emergency Department and triage nurse.  Should you have questions after your visit or need to cancel or reschedule your appointment, please contact [facility]  Dept: [phone]  and follow the prompts.  Office hours are 8:00 a.m. to 4:30 p.m. Monday - Friday. Please note that voicemails left after 4:00 p.m. may not be returned until the following business day.  We are closed weekends and major holidays. You have access to a nurse at all times for urgent questions. Please call the main number to the clinic Dept: [phone] and follow the prompts.   For any non-urgent questions, you may also contact your provider using MyChart. We now offer e-Visits for anyone 18 and older to request care online for non-urgent symptoms. For details visit [email or web address].   Also download the MyChart app! Go to the app store, search "MyChart", open the app, select [facility], and log in with your MyChart username and password.  Due to Covid, a mask is required upon entering the hospital/clinic. If you do not have a mask, one will be given to you upon arrival. For doctor visits, patients may have 1 support person aged 18 or older with them. For treatment visits, patients cannot have anyone with them due to current Covid guidelines and our immunocompromised population.   

## 2021-09-12 NOTE — Progress Notes (Signed)
Pt here for patient teaching. Pt given Radiation and You booklet, Managing Acute Radiation Side Effects for Head and Neck Cancer handout, skin care instructions, and Sonafine.  Reviewed areas of pertinence such as fatigue, hair loss, mouth changes, skin changes, throat changes, headache, shortness of breath, earaches, and taste changes. Pt able to give teach back of to pat skin, use unscented/gentle soap, and drink plenty of water, apply Sonafine bid, avoid applying anything to skin within 4 hours of treatment, and to use an electric razor if they must shave. Pt verbalizes understanding of information given and will contact nursing with any questions or concerns.     [email or web address]

## 2021-09-13 ENCOUNTER — Ambulatory Visit
Admission: RE | Admit: 2021-09-13 | Discharge: 2021-09-13 | Disposition: A | Discharge status: Home / Self Care | Payer: Medicare Other | Source: Ambulatory Visit | Attending: Radiation Oncology | Admitting: Radiation Oncology

## 2021-09-13 ENCOUNTER — Other Ambulatory Visit: Payer: Medicare Other

## 2021-09-13 ENCOUNTER — Ambulatory Visit: Payer: Medicare Other | Admitting: Hematology and Oncology

## 2021-09-13 DIAGNOSIS — Z510 Encounter for antineoplastic radiation therapy: Secondary | ICD-10-CM | POA: Diagnosis not present

## 2021-09-13 DIAGNOSIS — C320 Malignant neoplasm of glottis: Secondary | ICD-10-CM | POA: Diagnosis not present

## 2021-09-14 ENCOUNTER — Other Ambulatory Visit: Payer: Self-pay

## 2021-09-14 ENCOUNTER — Ambulatory Visit (HOSPITAL_COMMUNITY)
Admission: RE | Admit: 2021-09-14 | Discharge: 2021-09-14 | Disposition: A | Discharge status: Home / Self Care | Payer: Medicare Other | Source: Ambulatory Visit | Attending: Radiation Oncology | Admitting: Radiation Oncology

## 2021-09-14 ENCOUNTER — Ambulatory Visit
Admission: RE | Admit: 2021-09-14 | Discharge: 2021-09-14 | Disposition: A | Discharge status: Home / Self Care | Payer: Medicare Other | Source: Ambulatory Visit | Attending: Radiation Oncology | Admitting: Radiation Oncology

## 2021-09-14 DIAGNOSIS — Z510 Encounter for antineoplastic radiation therapy: Secondary | ICD-10-CM | POA: Diagnosis not present

## 2021-09-14 DIAGNOSIS — C320 Malignant neoplasm of glottis: Secondary | ICD-10-CM

## 2021-09-14 DIAGNOSIS — K224 Dyskinesia of esophagus: Secondary | ICD-10-CM | POA: Diagnosis not present

## 2021-09-14 DIAGNOSIS — R1310 Dysphagia, unspecified: Secondary | ICD-10-CM | POA: Insufficient documentation

## 2021-09-14 DIAGNOSIS — Z8521 Personal history of malignant neoplasm of larynx: Secondary | ICD-10-CM | POA: Diagnosis not present

## 2021-09-14 DIAGNOSIS — R6339 Other feeding difficulties: Secondary | ICD-10-CM | POA: Diagnosis not present

## 2021-09-14 NOTE — Progress Notes (Deleted)
[facility] OFFICE PROGRESS NOTE  Tammi Sou, MD [address] [place] [address]  DIAGNOSIS:  1) stage IVa (T2, N2, M0) laryngeal squamous cell carcinoma diagnosed in December 2022    2) Metastatic non-small cell lung cancer initially diagnosed as Stage IIIA (T2a., N2, M0) non-small cell lung cancer adenocarcinoma with negative EGFR mutation and negative ALK gene translocation involving the left upper lobe middle mediastinal lymphadenopathy diagnosed in August of 2014. He had metastatic disease to the brain diagnosed in December 2016.  Lung cancer   Primary site: Lung (Left)   Staging method: AJCC 7th Edition   Clinical: Stage IIIA (T2a, N2, M0) signed by [name], MD on 06/19/2013  5:06 PM   Summary: Stage IIIA (T2a, N2, M0).   Molecular biomarkers: Positive for [id number]*7, [id number] ATM splice site [id number]>UU TP53 E271K, SETD2 [id number] and SPOP E47K                                          Negative for: RET, ALK, BRAF, KRAS, ERBB2, MET and EGFR.    PRIOR THERAPY: 1) Status post left upper lobectomy with mediastinal lymph node dissection on 05/29/2013. 2) Adjuvant chemotherapy with cisplatin at 75 mg per meter squared and Alimta at 500 mg per meter squared given every 3 weeks for a total of 4 cycles. First cycle given on 07/10/2013 3) curative radiotherapy to the mediastinum under the care of Dr. [name] on 12/12/2013. 4) status post stereotactic radiotherapy to the metastatic brain lesion on 08/25/2015 under the care of Dr. [name] CURRENT THERAPY:  Concurrent chemoradiation with weekly cisplatin 40 Mg/M2.  First dose September 06, 2021. Status post 3 cycles.     INTERVAL HISTORY: AnSIGFR[email or web address] male returns to the clinic today for a follow-up visit accompanied by his wife.  The patient was recently diagnosed with laryngeal squamous cell carcinoma.  He had a PET scan performed that did not show any evidence of  metastatic disease outside the neck area and no recurrence of lung cancer.  He is currently undergoing concurrent chemoradiation.  He is status post his 3rd weekly chemotherapy with cisplatin and he has been tolerated it well without any concerning adverse side effects except for mild fatigue. He denies any fever, chills, or night sweats. His weight is stable. He is wondering if he should drink supplemental drinks. He notes he may feel nauseous if he eats a large amount. However, he rates his nausea 1/10 and has not needed to take his anti-emetic. He recently under went a barium swallow study. Denies any chest pain, shortness of breath,  or hemoptysis.  He has a cough but reports it is secondary to his smoking. Denies any diarrhea. He has some constipation for which his wife bought miralax. Denies any headache or visual changes.  He sometimes with have a sharp fleeting pain in his head but it resolves spontaneously without any intervention. The patient is here today for evaluation and repeat blood work before undergoing cycle #4.  MEDICAL HISTORY: Past Medical History:  Diagnosis Date   AAA (abdominal aortic aneurysm)    s/p repair (aortoiliac bipass; with persistent endograft leak in inferior mesenteric artery)--stable as of 01/2017 vascular f/u.   Asymptomatic cholelithiasis 07/2015   Incidental finding on PET CT   Back pain 04/19/2016   BPH with elevated PSA  PSA signif rise 11/2017 (5.4 in 2015 to 31.21 November 2017. Bx 03/2018 benign.   Coronary artery disease    MI 1992, S/P  PTCA; negative stress test in November 2011 with no ischemia.    COVID-19 virus infection 12/07/2020   Diverticulosis    Elevated PSA 01/2018   01/22/18:  PSA 26.5.  Prostate MRI with abnormality->subsequent prostate bx BENIGN 04/12/18.PSA 72019 stbl at 24.1->rose to 36 03/2020->rec'd return to urol.   Gross hematuria 05/2018   Occurred 2+ mo's after prostate bx: attributed to bx by urol, treated empirically with keflex.  Urol  initially suspected gross hem was sequela of recent prost bx. BUT recurrence of gross hem prompted full hematuria w/u 11/15/18.   Hearing loss    Bilateral    Hepatic steatosis    History of radiation therapy 11/10/13-12/12/13   lung,50Gy/57f   Hyperlipidemia    Crestor= myalgias   Hypertension    Laryngeal mass 07/2021   with L cervical lymphadenopathy.  ENT->bx 07/29/21->invasive SCC   Lung cancer ([facility] 05/2013   non-small cell;  L upper lobectomy with mediastinal LN dissection, chemo, and radiation in 2014/2015. Remission until pt had questionable seizure 07/2015--MRI showed brain mets; palliative brain rad (stereotactic radiation therapy) started 08/25/15.  CT C/A/P clear 02/2016, 05/2016, 11/2016, 12/2018. MRI brain 12/2018 stable lesions/no new mets. Rpt 6 mo per onc.   Malignant neoplasm of upper lobe, left bronchus or lung ([facility] 10/29/2013   *?[facility] 4.5[facility]  Dr. MJulien Nordmann(onc) recommended f/u CXR after abx course.  If mass unchanged then repeat CT chest.  Surveillance CT chest and MRI brain 06/2018 show no sign of dz.   [facility]   Dr MAngelena Form    Obstructive uropathy    obstructive and reflux uropathy   Peripheral vascular disease ([facility] 08/2012   4.8x4.6 cm infrarenal abdominal aortic fusiform aneurysm, 1.5 cm right common iliac artery aneurysm.  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   Pneumonia 09/06/2018   [facility]   [facility]   Stereotactic radiation therapy: frontoparietal 18gy,posterior frontal lobe 20gy,   Seizure disorder ([facility] 2016/2017   as sequela of brain mets;  Grand mal seizure 07/2015, then got on keppra and was seizure-free until focal motor seizures of L side of face began 02/2016- these responded well to  up-titration of keppra but pt had adverse side effects so eventually pt had to be switched over to lamictal 07/2016--stable/seizure free on this med as of 09/2018 neuro f/u.   Tibial plateau fracture, right 03/2021   Tobacco dependence    "Quit" 1992, but pt has smoked "on and off" since that time   Unilateral vocal cord paralysis    left, onset s/p mediastinoscopy with mediastinal LN resection 2014.    ALLERGIES:  is allergic to quinolones, wellbutrin [bupropion], rosuvastatin, and iodinated contrast media.  MEDICATIONS:  Current Outpatient Medications  Medication Sig Dispense Refill   amLODipine (NORVASC) 5 MG tablet TAKE 1 TABLET BY MOUTH EVERY DAY 90 tablet 1   aspirin 81 MG chewable tablet Chew 81 mg by mouth daily.     atorvastatin (LIPITOR) 20 MG tablet TAKE 1 TABLET BY MOUTH EVERY DAY 90 tablet 1   budesonide-formoterol (SYMBICORT) 160-4.5 MCG/ACT inhaler INHALE 1-2 PUFFS INTO THE  LUNGS EVERY 12 HOURS. GARGLE AND SPIT AFTER USE. 10.2 each 11   diphenhydrAMINE (BENADRYL) 50 MG tablet Take 92m of Benadryl along with 54mof Prednisone 2 hours before CT scan. (Patient not taking: Reported on 08/29/2021) 1 tablet 0   lamoTRIgine (LAMICTAL) 200 MG tablet Take 1 tablet in the morning and 1 and 1/2 tablets in the evening 225 tablet 3   metoprolol tartrate (LOPRESSOR) 50 MG tablet TAKE 1 TABLET BY MOUTH TWICE A DAY 180 tablet 0   pantoprazole (PROTONIX) 40 MG tablet TAKE 1 TABLET BY MOUTH EVERY DAY 90 tablet 0   predniSONE (DELTASONE) 50 MG tablet Take 5058mrednisone 13 hours, then 80m49mednisone 7 hours, then 80mg62mdnisone along with 80mg 20menadryl 2 hours before CT Scan. (Patient not taking: Reported on 08/29/2021) 3 tablet 0   prochlorperazine (COMPAZINE) 10 MG tablet Take 1 tablet (10 mg total) by mouth every 6 (six) hours as needed for nausea or vomiting. 30 tablet 0   tamsulosin (FLOMAX) 0.4 MG CAPS capsule Take 0.4 mg by mouth daily.     No current facility-administered medications  for this visit.    SURGICAL HISTORY:  Past Surgical History:  Procedure Laterality Date   ABDOMINAL AORTIC ENDOVASCULAR STENT GRAFT N/A 05/07/2014   Procedure: ABDOMINAL AORTIC ENDOVASCULAR STENT GRAFT;  Surgeon: Vance Serafina Mitchell [facility];[place];  Laterality: N/A;   Carotid duplex dopplers  07/2015   1-39% on R, no signif dz noted on L   COLONOSCOPY W/ POLYPECTOMY  2003    negative 2010,due 2020; Dr BrodieOlevia PerchesONARY ANGIOPLASTY     no stents   CYSTOSCOPY/RETROGRADE/URETEROSCOPY Bilateral 10/09/2012   Procedure: BILATERAL RETROGRADE bladder and urethral BIOPSY ;  Surgeon: DanielMolli Hazard Location: [facility];  Service: Urology;  Laterality: Bilateral;  BILATERAL RETROGRADE    EEG  08/05/15   Pt placed on keppra just prior to this test due to having ? seizure (MRI showed brain mets)   HERNIA REPAIR Bilateral    Inguinal   INGUINAL HERIIORRHAPHY BILATERALLY     IR ANGIOGRAM SELECTIVE EACH ADDITIONAL VESSEL  12/03/2017   IR ANGIOGRAM VISCERAL SELECTIVE  12/03/2017   IR ANGIOGRAM VISCERAL SELECTIVE  12/03/2017   IR EMBO ARTERIAL NOT HEMORR HEMANG INC GUIDE ROADMAPPING  12/03/2017   IR GENERIC HISTORICAL  07/20/2016   IR RADIOLOGIST EVAL & MGMT 07/20/2016 [facility] INTER[facility] RAD   IR GENERIC HISTORICAL  08/02/2016   IR RADIOLOGIST EVAL & MGMT 08/02/2016 [facility]   IR RADIOLOGIST EVAL & MGMT  11/08/2017   IR RADIOLOGIST EVAL & MGMT  01/01/2018   IR RADIOLOGIST EVAL & MGMT  08/27/2019   IR RADIOLOGIST EVAL & MGMT  09/16/2020   IR [place] VASC ACCESS RIGHT  12/03/2017   LARYNGOSCOPY AND ESOPHAGOSCOPY N/A 08/09/2021   Procedure: MICRO DIRECT LARYNGOSCOPY WITH BIOPSY AND ESOPHAGOSCOPY;  Surgeon: Bates,Melida Quitter [facility];[place]: ENT;  Laterality: N/A;   MEDIASTINOSCOPY N/A 05/01/2013   Procedure: MEDIASTINOSCOPY;  Surgeon: Bryan Gaye Pollack [facility];[place];  Laterality: N/A;   PFTs  04/2013   Minimal obstructive  airway disease   PILONIDAL CYST EXCISION     PROSTATE BIOPSY N/A 10/09/2012   NEG bx 04/12/18 as well.  Procedure: PROSTATIC URETHRAL BIOPSY--BPH--no evidence of malignancy;  Surgeon: DanielMolli Hazard Location: [facility];  Service: Urology;  Laterality: N/A;  PROSTATIC URETHRAL  BIOPSY   PROSTATE BIOPSY  03/2018   NEG   PTCA  1992   ROTATOR CUFF REPAIR     Bilateral   THOROCOTOMY WITH LOBECTOMY Left 05/29/2013   Procedure: LEFT THOROCOTOMY WITH LEFT UPPER LOBE LOBECTOMY;  Surgeon: Gaye Pollack, MD;  Location: [facility] OR;  Service: Thoracic;  Laterality: Left;   TRANSTHORACIC ECHOCARDIOGRAM  07/2015   EF 50-55%, hypokin of inf myoc, grd I DD, mild MR, mod dilat of LA.   VIDEO BRONCHOSCOPY N/A 05/01/2013   Procedure: VIDEO BRONCHOSCOPY;  Surgeon: Gaye Pollack, MD;  Location: [facility] OR;  Service: Thoracic;  Laterality: N/A;    REVIEW OF SYSTEMS:   Review of Systems  Constitutional: Negative for appetite change, chills, fatigue, fever and unexpected weight change.  HENT:   Negative for mouth sores, nosebleeds, sore throat and trouble swallowing.   Eyes: Negative for eye problems and icterus.  Respiratory: Negative for cough, hemoptysis, shortness of breath and wheezing.   Cardiovascular: Negative for chest pain and leg swelling.  Gastrointestinal: Negative for abdominal pain, constipation, diarrhea, nausea and vomiting.  Genitourinary: Negative for bladder incontinence, difficulty urinating, dysuria, frequency and hematuria.   Musculoskeletal: Negative for back pain, gait problem, neck pain and neck stiffness.  Skin: Negative for itching and rash.  Neurological: Negative for dizziness, extremity weakness, gait problem, headaches, light-headedness and seizures.  Hematological: Negative for adenopathy. Does not bruise/bleed easily.  Psychiatric/Behavioral: Negative for confusion, depression and sleep disturbance. The patient is not nervous/anxious.     PHYSICAL EXAMINATION:  There were no  vitals taken for this visit.  ECOG PERFORMANCE STATUS: {CHL ONC ECOG [id number]  Physical Exam  Constitutional: Oriented to person, place, and time and well-developed, well-nourished, and in no distress. No distress.  HENT:  Head: Normocephalic and atraumatic.  Mouth/Throat: Oropharynx is clear and moist. No oropharyngeal exudate.  Eyes: Conjunctivae are normal. Right eye exhibits no discharge. Left eye exhibits no discharge. No scleral icterus.  Neck: Normal range of motion. Neck supple.  Cardiovascular: Normal rate, regular rhythm, normal heart sounds and intact distal pulses.   Pulmonary/Chest: Effort normal and breath sounds normal. No respiratory distress. No wheezes. No rales.  Abdominal: Soft. Bowel sounds are normal. Exhibits no distension and no mass. There is no tenderness.  Musculoskeletal: Normal range of motion. Exhibits no edema.  Lymphadenopathy:    No cervical adenopathy.  Neurological: Alert and oriented to person, place, and time. Exhibits normal muscle tone. Gait normal. Coordination normal.  Skin: Skin is warm and dry. No rash noted. Not diaphoretic. No erythema. No pallor.  Psychiatric: Mood, memory and judgment normal.  Vitals reviewed.  LABORATORY DATA: Lab Results  Component Value Date   WBC 10.3 09/12/2021   HGB 13.6 09/12/2021   HCT 39.0 09/12/2021   MCV 84.2 09/12/2021   PLT 260 09/12/2021      Chemistry      Component Value Date/Time   NA 137 09/12/2021 0732   NA 141 06/18/2017 1006   K 4.0 09/12/2021 0732   K 5.3 (H) 06/18/2017 1006   CL 105 09/12/2021 0732   CO2 25 09/12/2021 0732   CO2 25 06/18/2017 1006   BUN 19 09/12/2021 0732   BUN 14.3 06/18/2017 1006   CREATININE 0.98 09/12/2021 0732   CREATININE 0.9 06/18/2017 1006      Component Value Date/Time   CALCIUM 9.4 09/12/2021 0732   CALCIUM 10.3 06/18/2017 1006   ALKPHOS 67 09/12/2021 0732   ALKPHOS 63 06/18/2017 1006   AST  11 (L) 09/12/2021 0732   AST 15 06/18/2017 1006   ALT  11 09/12/2021 0732   ALT 17 06/18/2017 1006   BILITOT 0.5 09/12/2021 0732   BILITOT 0.35 06/18/2017 1006       RADIOGRAPHIC STUDIES:  DG SWALLOW FUNC OP MEDICARE SPEECH PATH  Result Date: 09/14/2021 CLINICAL DATA:  Dysphagia with solid foods and pills sticking in the throat. History of lung and laryngeal cancer, the latter being treated with radiation therapy. Left neck mass. EXAM: MODIFIED BARIUM SWALLOW TECHNIQUE: Different consistencies of barium were administered orally to the patient by the Speech Pathologist. Imaging of the pharynx was performed in the lateral projection. Gareth Eagle, PA was present in the fluoroscopy room during this study, which was supervised and interpreted by myself. FLUOROSCOPY TIME:  Fluoroscopy Time:  1 minute and 36 seconds Radiation Exposure Index (if provided by the fluoroscopic device): 10.6 mGy Number of Acquired Spot Images: 0 COMPARISON:  PET-CT 08/26/2021 and CT neck 07/22/2021. FINDINGS: Vestibular Penetration: Flash penetration with thin and nectar barium. Aspiration:  None seen. Other: Mild vallecular pooling. Barium tablet was temporarily retained within the vallecula but passed into the thoracic esophagus after ingesting Phillip Heal cracker. Prominent posterior cricopharyngeal impression on the cervical esophagus. Mild thoracic esophageal dysmotility. IMPRESSION: Laryngeal penetration with thin and nectar barium. No aspiration identified. Vallecular pooling and esophageal dysmotility. Please refer to speech therapist's report for additional details and recommendations. Electronically Signed   By: Richardean Sale M.D.   On: 09/14/2021 14:02   NM PET Image Initial (PI) Skull Base To Thigh  Result Date: 08/27/2021 CLINICAL DATA:  Initial treatment strategy for laryngeal carcinoma. History of non-small cell lung cancer status post chemo radiation. EXAM: NUCLEAR MEDICINE PET SKULL BASE TO THIGH TECHNIQUE: 5.4 mCi F-18 FDG was injected intravenously. Full-ring PET  imaging was performed from the skull base to thigh after the radiotracer. CT data was obtained and used for attenuation correction and anatomic localization. Fasting blood glucose: 120 mg/dl COMPARISON:  Neck CT 07/22/2021 and prior chest CT 04/07/2021 FINDINGS: Mediastinal blood pool activity: SUV max 2.37 Liver activity: SUV max NA NECK: Subtle soft tissue thickening along the anterior aspect of the larynx with mild hypermetabolism and SUV max of 5.91. Is also mild asymmetric hypermetabolism involving the right true cord region. Large necrotic level 2-3 nodal mass in the left neck is hypermetabolic with SUV max of 29.56 and consistent with metastatic adenopathy. No contralateral neck adenopathy or supraclavicular adenopathy. Incidental CT findings: none CHEST: Stable surgical and radiation changes involving the left hilum and left paramediastinal lung. No areas of hypermetabolism to suggest residual or recurrent tumor. No enlarged or hypermetabolic mediastinal or hilar lymph nodes. No worrisome pulmonary nodules to suggest pulmonary metastatic disease. Incidental CT findings: Stable advanced vascular calcifications. Small amount of pericardial fluid. ABDOMEN/PELVIS: No findings to suggest abdominal/pelvic metastatic disease. No hepatic or adrenal gland lesions. No enlarged or hypermetabolic abdominal or pelvic lymph nodes. Incidental CT findings: Aortoiliac stent graft in good position without obvious complicating features. Surrounding aneurysm is stable. Cholelithiasis. Multiple left renal cysts and left renal calculi. Moderately thick walled bladder likely due to partial bladder outlet obstruction from an enlarged prostate gland with median lobe hypertrophy impressing on the base of the bladder. Left inguinal hernia contains small bowel loops but no findings for obstruction or incarceration. SKELETON: No findings to suggest osseous metastatic disease. Incidental CT findings: none IMPRESSION: 1. Small  hypermetabolic anterior laryngeal lesion and associated partially necrotic left neck adenopathy consistent with known  cancer. 2. No findings for metastatic disease involving the chest, abdomen, pelvis or bony structures. 3. Remote surgical and radiation changes from left lung cancer but no findings to suggest recurrent tumor. 4. Incidental findings as detailed above. Electronically Signed   By: Marijo Sanes M.D.   On: 08/27/2021 16:09     ASSESSMENT/PLAN:  This is a very pleasant 81 years old white male with  1) stage IVa (T2, N2, M0) laryngeal squamous cell carcinoma diagnosed in December 2022:  Because of his age and comorbidities, Dr. Julien Nordmann would not consider the cisplatin every 3 weeks but he would consider him for weekly cisplatin 40 Mg/M2 during the 7 weeks of his treatment. Therefore, the patient is currently undergoing weekly cisplatin. He is status post 3 cycles and has tolerated it well.    Labs were reviewed. Recommend that he proceed with cycle #4 today as scheduled.   He will come back for follow-up visit in 2 weeks for evaluation before starting cycle #6.   The patient is strongly encouraged to quit smoking.    Supplemental drinks   2) metastatic non-small cell lung cancer that was initially diagnosed as a stage IIIa adenocarcinoma status post left upper lobectomy with mediastinal lymph node dissection followed by adjuvant systemic chemotherapy with cisplatin and Alimta. The patient also received stereotactic radiotherapy to metastatic brain lesions in January 2017. The patient has been in observation since that time and he is feeling fine today with no concerning complaints.  His most recent PET scan showed no concerning findings for disease recurrence in the chest, abdomen and pelvis.  He will continue on observation for this problem. The patient was advised to call immediately if he has any concerning symptoms in the interval.                No orders of the defined  types were placed in this encounter.    I spent {CHL ONC TIME VISIT - WXIPP:[id number]} counseling the patient face to face. The total time spent in the appointment was {CHL ONC TIME VISIT - DLKZG:[id number]}.  Fallon Howerter L Zachary Lovins, PA-C 09/14/21

## 2021-09-15 ENCOUNTER — Other Ambulatory Visit: Payer: Self-pay

## 2021-09-15 ENCOUNTER — Ambulatory Visit: Payer: Medicare Other

## 2021-09-15 ENCOUNTER — Ambulatory Visit
Admission: RE | Admit: 2021-09-15 | Discharge: 2021-09-15 | Disposition: A | Discharge status: Home / Self Care | Payer: Medicare Other | Source: Ambulatory Visit | Attending: Radiation Oncology | Admitting: Radiation Oncology

## 2021-09-15 DIAGNOSIS — R490 Dysphonia: Secondary | ICD-10-CM

## 2021-09-15 DIAGNOSIS — R1312 Dysphagia, oropharyngeal phase: Secondary | ICD-10-CM

## 2021-09-15 DIAGNOSIS — C320 Malignant neoplasm of glottis: Secondary | ICD-10-CM | POA: Diagnosis not present

## 2021-09-15 DIAGNOSIS — R1310 Dysphagia, unspecified: Secondary | ICD-10-CM | POA: Diagnosis not present

## 2021-09-15 DIAGNOSIS — Z510 Encounter for antineoplastic radiation therapy: Secondary | ICD-10-CM | POA: Diagnosis not present

## 2021-09-15 NOTE — Patient Instructions (Signed)
SWALLOWING EXERCISES Do these until 6 months after your last day of radiation, then 2-3 times per week afterwards  Effortful Swallows - Press your tongue against the roof of your mouth for 3 seconds, then squeeze the muscles in your neck while you swallow your saliva or a sip of water - Repeat 10-15 times, 2-3 times a day, and use whenever you eat or drink  Masako Swallow - swallow with your tongue sticking out - Stick tongue out past your lips and gently bite tongue with your teeth - Swallow, while holding your tongue with your teeth - Repeat 10-15 times, 2-3 times a day *use a wet spoon if your mouth gets dry*  Pitch Raise - Repeat he, once per second in as high of a pitch as you can - Repeat 20 times, 2-3 times a day  [employer] - half swallow exercise - Start to swallow, and keep your Adams apple up by squeezing hard with the muscles of the throat - Hold the squeeze for 5-7 seconds and then relax - Repeat 10-15 times, 2-3 times a day *use a wet spoon if your mouth gets dry*       5.   "Super Swallow"  - Take a breath and hold it  - Bear down (like pushing your bowels)  - Swallow then IMMEDIATELY cough  - Repeat 10 times, 2-3 times a day       6.   Siren exercise  - start at as low of a pitch as you can saying "ooooooooo" and glide up to as high as you can then back down as low as you can  - repeat 10-20 times, 2-3 times a day

## 2021-09-16 ENCOUNTER — Ambulatory Visit
Admission: RE | Admit: 2021-09-16 | Discharge: 2021-09-16 | Disposition: A | Discharge status: Home / Self Care | Payer: Medicare Other | Source: Ambulatory Visit | Attending: Radiation Oncology | Admitting: Radiation Oncology

## 2021-09-16 DIAGNOSIS — Z510 Encounter for antineoplastic radiation therapy: Secondary | ICD-10-CM | POA: Diagnosis not present

## 2021-09-16 DIAGNOSIS — C320 Malignant neoplasm of glottis: Secondary | ICD-10-CM | POA: Diagnosis not present

## 2021-09-16 MED FILL — Dexamethasone Sodium Phosphate Inj 100 MG/10ML: INTRAMUSCULAR | Qty: 1 | Status: AC

## 2021-09-16 MED FILL — Fosaprepitant Dimeglumine For IV Infusion 150 MG (Base Eq): INTRAVENOUS | Qty: 5 | Status: AC

## 2021-09-16 NOTE — Therapy (Addendum)
[facility] [address] [place], [place], [address] Phone: [phone]   Fax:  [phone]  Speech Language Pathology Evaluation  Patient Details  Name: Jordan Mccullough MRN: [id number] Date of Birth: 06/28/1941 Referring Provider (SLP): Eppie Gibson, MD   Encounter Date: 09/15/2021   End of Session - 09/16/21 0914     Visit Number 1    Number of Visits 4    Date for SLP Re-Evaluation 12/14/21    SLP Start Time 0932    SLP Stop Time  1013    SLP Time Calculation (min) 41 min    Activity Tolerance Patient tolerated treatment well             Past Medical History:  Diagnosis Date   AAA (abdominal aortic aneurysm)    s/p repair (aortoiliac bipass; with persistent endograft leak in inferior mesenteric artery)--stable as of 01/2017 vascular f/u.   Asymptomatic cholelithiasis 07/2015   Incidental finding on PET CT   Back pain 04/19/2016   BPH with elevated PSA    PSA signif rise 11/2017 (5.4 in 2015 to 31.21 November 2017. Bx 03/2018 benign.   Coronary artery disease    MI 1992, S/P  PTCA; negative stress test in November 2011 with no ischemia.    COVID-19 virus infection 12/07/2020   Diverticulosis    Elevated PSA 01/2018   01/22/18:  PSA 26.5.  Prostate MRI with abnormality->subsequent prostate bx BENIGN 04/12/18.PSA 72019 stbl at 24.1->rose to 36 03/2020->rec'd return to urol.   Gross hematuria 05/2018   Occurred 2+ mo's after prostate bx: attributed to bx by urol, treated empirically with keflex.  Urol initially suspected gross hem was sequela of recent prost bx. BUT recurrence of gross hem prompted full hematuria w/u 11/15/18.   Hearing loss    Bilateral    Hepatic steatosis    History of radiation therapy 11/10/13-12/12/13   lung,50Gy/74fx   Hyperlipidemia    Crestor= myalgias   Hypertension    Laryngeal mass 07/2021   with L cervical lymphadenopathy.  ENT->bx 07/29/21->invasive SCC   Lung cancer ([place]) 05/2013   non-small  cell;  L upper lobectomy with mediastinal LN dissection, chemo, and radiation in 2014/2015. Remission until pt had questionable seizure 07/2015--MRI showed brain mets; palliative brain rad (stereotactic radiation therapy) started 08/25/15.  CT C/A/P clear 02/2016, 05/2016, 11/2016, 12/2018. MRI brain 12/2018 stable lesions/no new mets. Rpt 6 mo per onc.   Malignant neoplasm of upper lobe, left bronchus or lung ([place]) 10/29/2013   *?New spiculated 4.5cm mass in L upper lung field on CXR at [facility] in [place] 07/14/17.  Dr. Julien Nordmann (onc) recommended f/u CXR after abx course.  If mass unchanged then repeat CT chest.  Surveillance CT chest and MRI brain 06/2018 show no sign of dz.   Myocardial infarction [facility]) 1992   Dr Angelena Form     Obstructive uropathy    obstructive and reflux uropathy   Peripheral vascular disease ([place]) 08/2012   4.8x4.6 cm infrarenal abdominal aortic fusiform aneurysm, 1.5 cm right common iliac artery aneurysm.  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   Pneumonia 09/06/2018   XRAY was done at [facility].   S/P radiation therapy [facility] 08/25/15   Stereotactic radiation therapy: frontoparietal 18gy,posterior frontal lobe 20gy,   Seizure disorder ([place]) 2016/2017   as sequela of brain mets;  Grand mal  seizure 07/2015, then got on keppra and was seizure-free until focal motor seizures of L side of face began 02/2016- these responded well to up-titration of keppra but pt had adverse side effects so eventually pt had to be switched over to lamictal 07/2016--stable/seizure free on this med as of 09/2018 neuro f/u.   Tibial plateau fracture, right 03/2021   Tobacco dependence    "Quit" 1992, but pt has smoked "on and off" since that time   Unilateral vocal cord paralysis    left, onset s/p mediastinoscopy with mediastinal LN resection 2014.    Past Surgical History:   Procedure Laterality Date   ABDOMINAL AORTIC ENDOVASCULAR STENT GRAFT N/A 05/07/2014   Procedure: ABDOMINAL AORTIC ENDOVASCULAR STENT GRAFT;  Surgeon: Serafina Mitchell, MD;  Location: [place] [facility];  Service: Vascular;  Laterality: N/A;   Carotid duplex dopplers  07/2015   1-39% on R, no signif dz noted on L   COLONOSCOPY W/ POLYPECTOMY  2003    negative 2010,due 2020; Dr Olevia Perches   CORONARY ANGIOPLASTY     no stents   CYSTOSCOPY/RETROGRADE/URETEROSCOPY Bilateral 10/09/2012   Procedure: BILATERAL RETROGRADE bladder and urethral BIOPSY ;  Surgeon: Molli Hazard, MD;  Location: [facility];  Service: Urology;  Laterality: Bilateral;  BILATERAL RETROGRADE    EEG  08/05/15   Pt placed on keppra just prior to this test due to having ? seizure (MRI showed brain mets)   HERNIA REPAIR Bilateral    Inguinal   INGUINAL HERIIORRHAPHY BILATERALLY     IR ANGIOGRAM SELECTIVE EACH ADDITIONAL VESSEL  12/03/2017   IR ANGIOGRAM VISCERAL SELECTIVE  12/03/2017   IR ANGIOGRAM VISCERAL SELECTIVE  12/03/2017   IR EMBO ARTERIAL NOT HEMORR HEMANG INC GUIDE ROADMAPPING  12/03/2017   IR GENERIC HISTORICAL  07/20/2016   IR RADIOLOGIST EVAL & MGMT 07/20/2016 [facility] INTERV RAD   IR GENERIC HISTORICAL  08/02/2016   IR RADIOLOGIST EVAL & MGMT 08/02/2016 Sandi Mariscal, MD [facility] INTERV RAD   IR RADIOLOGIST EVAL & MGMT  11/08/2017   IR RADIOLOGIST EVAL & MGMT  01/01/2018   IR RADIOLOGIST EVAL & MGMT  08/27/2019   IR RADIOLOGIST EVAL & MGMT  09/16/2020   IR US GUIDE VASC ACCESS RIGHT  12/03/2017   LARYNGOSCOPY AND ESOPHAGOSCOPY N/A 08/09/2021   Procedure: MICRO DIRECT LARYNGOSCOPY WITH BIOPSY AND ESOPHAGOSCOPY;  Surgeon: Melida Quitter, MD;  Location: [place] [facility];  Service: ENT;  Laterality: N/A;   MEDIASTINOSCOPY N/A 05/01/2013   Procedure: MEDIASTINOSCOPY;  Surgeon: Gaye Pollack, MD;  Location: [facility];  Service: Thoracic;  Laterality: N/A;   PFTs  04/2013   Minimal obstructive airway disease   PILONIDAL CYST EXCISION     PROSTATE BIOPSY N/A  10/09/2012   NEG bx 04/12/18 as well.  Procedure: PROSTATIC URETHRAL BIOPSY--BPH--no evidence of malignancy;  Surgeon: Molli Hazard, MD;  Location[facility];  Service: Urology;  Laterality: N/A;  PROSTATIC URETHRAL BIOPSY   PROSTATE BIOPSY  03/2018   NEG   PTCA  1992   ROTATOR CUFF REPAIR     Bilateral   THOROCOTOMY WITH LOBECTOMY Left 05/29/2013   Procedure: LEFT THOROCOTOMY WITH LEFT UPPER LOBE LOBECTOMY;  Surgeon: Gaye Pollack, MD;  Location[facility];  Service: Thoracic;  Laterality: Left;   TRANSTHORACIC ECHOCARDIOGRAM  07/2015   EF 50-55%, hypokin of inf myoc, grd I DD, mild MR, mod dilat of LA.   VIDEO BRONCHOSCOPY N/A 05/01/2013   Procedure: VIDEO BRONCHOSCOPY;  Surgeon: Gaye Pollack, MD;  Location:  [facility];  Service: Thoracic;  Laterality: N/A;    There were no vitals filed for this visit.   Subjective Assessment - 09/16/21 0913     Subjective Pt arrives today with MBSS yesterday - regular thin with alternate bite/sip, small bites/sips, clear throat intermittently, start with liquids.    Currently in Pain? No/denies                SLP Evaluation [facility] - 09/16/21 0001       SLP Visit Information   SLP Received On 09/15/21    Referring Provider (SLP) Eppie Gibson, MD    Onset Date November 2022    Medical Diagnosis glottis carcinoma      Subjective   Subjective Pt had baked chicken, pasta and butter, and green beans last night.    Patient/Family Stated Goal maintain WNL swallowing      Pain Assessment   Currently in Pain? No/denies      General Information   HPI Pt with glottis carcinoma, Stage IVA. He presented to his PCP on 07/19/21 with reports of painless swelling on the left side of his neck a couple of months ago. 07/22/21 CT neck revealed an anterior larynx mass and necrotic left jugular chain adenopathy most consistent with SCC. 07/29/21 Consult with Dr. Redmond Baseman. A Laryngoscopy was completed which revealed a mass of the anterior commissure extending  inferiorly. The left vocal fold was also noted as immobile. Findings were ultimately noted as concerning for laryngeal cancer. 07/29/21 FNA completed by Dr. Redmond Baseman revealed [facility] and further biopsies on 08-09-21 completed of the commissure mass under anesthesia revealed keratinizing SCC. 08/26/21 PET revealing the small hypermetabolic anterior laryngeal lesion, and associated partially necrotic left neck adenopathy consistent with known cancer. Treatment plan:  He will receive 35 fractions of radiation to his Larynx and bilateral neck with weekly cisplatin (Dr. Julien Nordmann).  He started treatment on 09/06/21 (chemo) and 09/07/21 (radiation) and will complete on 10/25/21.      Prior Functional Status   Cognitive/Linguistic Baseline Within functional limits      Cognition   Overall Cognitive Status Within Functional Limits for tasks assessed      Auditory Comprehension   Overall Auditory Comprehension Appears within functional limits for tasks assessed      Oral Motor/Sensory Function   Overall Oral Motor/Sensory Function Appears within functional limits for tasks assessed      Motor Speech   Phonation Hoarse;Other (comment)   harsh           Pt currently tolerates mostly dys I-II items, however MBSS yesterday recommended regular/thin with precautions.. POs: Pt drank water without overt s/s aspiration. Thyroid elevation appeared adequate, and swallows appeared timely. Please see MBSS report under "imaging" for more details.  Because data states the risk for dysphagia during and after radiation treatment is high due to undergoing radiation tx, SLP taught pt about the possibility of reduced/limited ability for PO intake during rad tx. SLP encouraged pt to continue swallowing POs as far into rad tx as possible, even ingesting POs and/or completing HEP shortly after administration of pain meds. Among other modifications for days when pt cannot functionally swallow, SLP talked about performing only  non-swallowing tasks on the handout/HEP, and if necessary to cycle through the swallowing portion so the program of exercises can be completed instead of fatiguing on one of the swallowing exercises and not being able to perform the other swallowing exercises. Pt was then told to add all reps of swallowing tasks  back in when it becomes possible to do so.  SLP educated pt re: changes to swallowing musculature after rad tx, and why adherence to dysphagia HEP provided today and PO consumption was necessary to reduce risk of muscle fibrosis following rad tx. Pt demonstrated understanding of these things to SLP.    SLP then developed a HEP for pt and pt was instructed how to perform exercises involving lingual, vocal, and pharyngeal strengthening. SLP performed each exercise and pt return demonstrated each exercise. SLP ensured pt performance was correct prior to moving on to next exercise. Pt was instructed to complete this program 2-3 times a day, 6-7 days/week until 6 months after his last rad tx, then x2 a week after that.                  SLP Education - 09/15/21 1020     Education Details HEP procedure, late effects head/neck radiation on swallowing function    Person(s) Educated Patient;Spouse    Methods Explanation;Demonstration;Verbal cues;Handout    Comprehension Verbalized understanding;Returned demonstration;Verbal cues required;Need further instruction              SLP Short Term Goals - 09/16/21 0935       SLP SHORT TERM GOAL #1   Title pt will complete HEP with rare min A    Time 1    Period --   visits, for all STGs   Status New      SLP SHORT TERM GOAL #2   Title pt will tell SLP why pt is completing HEP with modified independence in 2 sessions    Time 2    Status New      SLP SHORT TERM GOAL #3   Title pt will describe 3 overt s/s aspiration PNA with modified independence    Time 2    Status New      SLP SHORT TERM GOAL #4   Title pt will tell SLP  how a food journal could hasten return to a more normalized diet    Time 3    Status New              SLP Long Term Goals - 09/16/21 7902       SLP LONG TERM GOAL #1   Title pt will complete HEP with modified independence over two visits    Time 4    Period --   visits, for all LTGs   Status New      SLP LONG TERM GOAL #2   Title pt will describe how to modify HEP over time, and the timeline associated with reduction in HEP frequency with modified independence over two sessions    Time 5    Status New              Plan - 09/16/21 0915     Clinical Impression Statement At this time pt swallowing is deemed WNL/WFL with MBSS recommended regular/thin with precautinos as in "s".. SLP designed an individualized HEP for dysphagia and pt completed each exercise on their own with total cues faded to mod indpendent. There are no overt s/s aspiration reported by pt at this time. Data indicate that pt's swallow ability will likely decrease over the course of radiation therapy and could very well decline over time following conclusion of their radiation therapy due to muscle disuse atrophy and/or muscle fibrosis. Pt will cont to need to be seen by SLP in order to assess safety of PO intake, assess  the need for recommending any objective swallow assessment, and ensuring pt correctly completes the individualized HEP.    Speech Therapy Frequency --   approx once/month   Duration --   3 vistis this plan of care; 7 overall   Treatment/Interventions Aspiration precaution training;Pharyngeal strengthening exercises;Diet toleration management by SLP;Trials of upgraded texture/liquids;Internal/external aids;Patient/family education;SLP instruction and feedback             Patient will benefit from skilled therapeutic intervention in order to improve the following deficits and impairments:   Dysphagia, oropharyngeal phase    Problem List Patient Active Problem List   Diagnosis Date Noted    Encounter for antineoplastic chemotherapy 08/30/2021   Glottis carcinoma ([place]) 08/29/2021   Head and neck cancer ([place]) 08/24/2021   Encounter for preoperative dental examination 08/24/2021   Teeth missing 08/24/2021   Caries 08/24/2021   Chronic apical periodontitis 08/24/2021   Accretions on teeth 08/24/2021   Chronic periodontitis 08/24/2021   Phobia of dental procedure 08/24/2021   COVID-19 12/10/2020   Dysphonia 06/05/2019   Paralysis of left vocal cord 06/05/2019   Back pain 04/19/2016   Seizure disorder ([place]) 04/03/2016   DNR (do not resuscitate)    Palliative care encounter    Complaints of leg weakness 11/08/2015   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 seizures, not intractable, without status epilepticus ([place]) 10/11/2015   Syncope 08/05/2015   Seizure-like activity ([place]) 08/04/2015   Brain metastases ([place]) 08/04/2015   Seizure ([place]) 08/04/2015   AAA (abdominal aortic aneurysm) without rupture 06/21/2015   Aftercare following surgery of the circulatory system 06/21/2015   Essential hypertension 06/23/2014   Primary cancer of left upper lobe of lung ([place]) 06/19/2013   History of AAA (abdominal aortic aneurysm) repair 10/14/2012   Aneurysm of common iliac artery ([place]) 10/14/2012   Elevated PSA 07/10/2012   CAD (coronary artery disease) 07/31/2011   DIVERTICULOSIS, COLON 07/05/2010   COLONIC POLYPS 02/24/2010   AMBLYOPIA, HX OF 02/24/2010   HYPERGLYCEMIA, FASTING 05/15/2008   Hyperlipidemia 05/23/2007   MYOCARDIAL INFARCTION, HX OF 05/23/2007    Maybee, Willacy 09/16/2021, 9:39 AM  [facility] [address] [place], [place], [address] Phone: [phone]   Fax:  [phone]  Name: Jordan Mccullough MRN: [id number] Date of Birth: 03/07/1941

## 2021-09-19 ENCOUNTER — Other Ambulatory Visit: Payer: Self-pay | Admitting: Family Medicine

## 2021-09-19 ENCOUNTER — Other Ambulatory Visit: Payer: Self-pay | Admitting: Radiation Oncology

## 2021-09-19 ENCOUNTER — Inpatient Hospital Stay: Payer: Medicare Other

## 2021-09-19 ENCOUNTER — Other Ambulatory Visit: Payer: Self-pay

## 2021-09-19 ENCOUNTER — Ambulatory Visit
Admission: RE | Admit: 2021-09-19 | Discharge: 2021-09-19 | Disposition: A | Discharge status: Home / Self Care | Payer: Medicare Other | Source: Ambulatory Visit | Attending: Radiation Oncology | Admitting: Radiation Oncology

## 2021-09-19 VITALS — BP 145/77 | HR 76 | Temp 98.2°F | Resp 18 | Wt 142.1 lb

## 2021-09-19 DIAGNOSIS — C3412 Malignant neoplasm of upper lobe, left bronchus or lung: Secondary | ICD-10-CM

## 2021-09-19 DIAGNOSIS — Z510 Encounter for antineoplastic radiation therapy: Secondary | ICD-10-CM | POA: Diagnosis not present

## 2021-09-19 DIAGNOSIS — C320 Malignant neoplasm of glottis: Secondary | ICD-10-CM

## 2021-09-19 DIAGNOSIS — C760 Malignant neoplasm of head, face and neck: Secondary | ICD-10-CM

## 2021-09-19 DIAGNOSIS — C3490 Malignant neoplasm of unspecified part of unspecified bronchus or lung: Secondary | ICD-10-CM

## 2021-09-19 LAB — CBC WITH DIFFERENTIAL (CANCER CENTER ONLY)
Abs Immature Granulocytes: 0.03 10*3/uL (ref 0.00–0.07)
Basophils Absolute: 0.1 10*3/uL (ref 0.0–0.1)
Basophils Relative: 1 %
Eosinophils Absolute: 0.1 10*3/uL (ref 0.0–0.5)
Eosinophils Relative: 2 %
HCT: 38.3 % — ABNORMAL LOW (ref 39.0–52.0)
Hemoglobin: 12.9 g/dL — ABNORMAL LOW (ref 13.0–17.0)
Immature Granulocytes: 0 %
Lymphocytes Relative: 18 %
Lymphs Abs: 1.6 10*3/uL (ref 0.7–4.0)
MCH: 28.7 pg (ref 26.0–34.0)
MCHC: 33.7 g/dL (ref 30.0–36.0)
MCV: 85.1 fL (ref 80.0–100.0)
Monocytes Absolute: 1 10*3/uL (ref 0.1–1.0)
Monocytes Relative: 11 %
Neutro Abs: 6.2 10*3/uL (ref 1.7–7.7)
Neutrophils Relative %: 68 %
Platelet Count: 217 10*3/uL (ref 150–400)
RBC: 4.5 MIL/uL (ref 4.22–5.81)
RDW: 14.9 % (ref 11.5–15.5)
WBC Count: 8.9 10*3/uL (ref 4.0–10.5)
nRBC: 0 % (ref 0.0–0.2)

## 2021-09-19 LAB — CMP (CANCER CENTER ONLY)
ALT: 12 U/L (ref 0–44)
AST: 11 U/L — ABNORMAL LOW (ref 15–41)
Albumin: 3.9 g/dL (ref 3.5–5.0)
Alkaline Phosphatase: 66 U/L (ref 38–126)
Anion gap: 6 (ref 5–15)
BUN: 22 mg/dL (ref 8–23)
CO2: 25 mmol/L (ref 22–32)
Calcium: 9.2 mg/dL (ref 8.9–10.3)
Chloride: 105 mmol/L (ref 98–111)
Creatinine: 0.94 mg/dL (ref 0.61–1.24)
GFR, Estimated: >60 mL/min (ref >60)
Glucose, Bld: 95 mg/dL (ref 70–99)
Potassium: 4.1 mmol/L (ref 3.5–5.1)
Sodium: 136 mmol/L (ref 135–145)
Total Bilirubin: 0.4 mg/dL (ref 0.3–1.2)
Total Protein: 7.2 g/dL (ref 6.5–8.1)

## 2021-09-19 LAB — MAGNESIUM: Magnesium: 1.7 mg/dL (ref 1.7–2.4)

## 2021-09-19 MED ORDER — MAGNESIUM SULFATE 2 GM/50ML IV SOLN
2.0000 g | Freq: Once | INTRAVENOUS | Status: AC
  Administered 2021-09-19: 2 g via INTRAVENOUS
  Filled 2021-09-19: qty 50

## 2021-09-19 MED ORDER — PALONOSETRON HCL INJECTION 0.25 MG/5ML
0.2500 mg | Freq: Once | INTRAVENOUS | Status: AC
  Administered 2021-09-19: 0.25 mg via INTRAVENOUS
  Filled 2021-09-19: qty 5

## 2021-09-19 MED ORDER — ENSURE COMPLETE PO LIQD: 237.0000 mL | Freq: Four times a day (QID) | ORAL | 10 refills | Status: AC

## 2021-09-19 MED ORDER — SODIUM CHLORIDE 0.9 % IV SOLN
40.0000 mg/m2 | Freq: Once | INTRAVENOUS | Status: AC
  Administered 2021-09-19: 68 mg via INTRAVENOUS
  Filled 2021-09-19: qty 68

## 2021-09-19 MED ORDER — POTASSIUM CHLORIDE IN NACL 20-0.9 MEQ/L-% IV SOLN
Freq: Once | INTRAVENOUS | Status: AC
  Filled 2021-09-19: qty 1000

## 2021-09-19 MED ORDER — SODIUM CHLORIDE 0.9 % IV SOLN: Freq: Once | INTRAVENOUS | Status: AC

## 2021-09-19 MED ORDER — LIDOCAINE VISCOUS HCL 2 % MT SOLN: OROMUCOSAL | 3 refills | Status: AC

## 2021-09-19 MED ORDER — SODIUM CHLORIDE 0.9 % IV SOLN
150.0000 mg | Freq: Once | INTRAVENOUS | Status: AC
  Administered 2021-09-19: 150 mg via INTRAVENOUS
  Filled 2021-09-19: qty 150

## 2021-09-19 MED ORDER — SODIUM CHLORIDE 0.9 % IV SOLN
10.0000 mg | Freq: Once | INTRAVENOUS | Status: AC
  Administered 2021-09-19: 10 mg via INTRAVENOUS
  Filled 2021-09-19: qty 10

## 2021-09-19 MED ORDER — LORAZEPAM 2 MG/ML IJ SOLN
0.5000 mg | Freq: Once | INTRAMUSCULAR | Status: AC
  Administered 2021-09-19: 0.5 mg via INTRAVENOUS
  Filled 2021-09-19: qty 1

## 2021-09-19 NOTE — Patient Instructions (Signed)
[facility]ICAL [facility]  Discharge Instructions: Thank you for choosing [facility] to provide your oncology and hematology care.   If you have a lab appointment with the Cancer Center, please go directly to the Cancer Center and check in at the registration area.   Wear comfortable clothing and clothing appropriate for easy access to any Portacath or PICC line.   We strive to give you quality time with your provider. You may need to reschedule your appointment if you arrive late (15 or more minutes).  Arriving late affects you and other patients whose appointments are after yours.  Also, if you miss three or more appointments without notifying the office, you may be dismissed from the clinic at the provider's discretion.      For prescription refill requests, have your pharmacy contact our office and allow 72 hours for refills to be completed.    Today you received the following chemotherapy and/or immunotherapy agents : Cisplatin    To help prevent nausea and vomiting after your treatment, we encourage you to take your nausea medication as directed.  BELOW ARE SYMPTOMS THAT SHOULD BE REPORTED IMMEDIATELY: *FEVER GREATER THAN 100.4 F (38 C) OR HIGHER *CHILLS OR SWEATING *NAUSEA AND VOMITING THAT IS NOT CONTROLLED WITH YOUR NAUSEA MEDICATION *UNUSUAL SHORTNESS OF BREATH *UNUSUAL BRUISING OR BLEEDING *URINARY PROBLEMS (pain or burning when urinating, or frequent urination) *BOWEL PROBLEMS (unusual diarrhea, constipation, pain near the anus) TENDERNESS IN MOUTH AND THROAT WITH OR WITHOUT PRESENCE OF ULCERS (sore throat, sores in mouth, or a toothache) UNUSUAL RASH, SWELLING OR PAIN  UNUSUAL VAGINAL DISCHARGE OR ITCHING   Items with * indicate a potential emergency and should be followed up as soon as possible or go to the Emergency Department if any problems should occur.  Please show the CHEMOTHERAPY ALERT CARD or IMMUNOTHERAPY ALERT CARD at check-in to  the Emergency Department and triage nurse.  Should you have questions after your visit or need to cancel or reschedule your appointment, please contact [facility]  Dept: [phone]  and follow the prompts.  Office hours are 8:00 a.m. to 4:30 p.m. Monday - Friday. Please note that voicemails left after 4:00 p.m. may not be returned until the following business day.  We are closed weekends and major holidays. You have access to a nurse at all times for urgent questions. Please call the main number to the clinic Dept: [phone] and follow the prompts.   For any non-urgent questions, you may also contact your provider using MyChart. We now offer e-Visits for anyone 18 and older to request care online for non-urgent symptoms. For details visit [email or web address].   Also download the MyChart app! Go to the app store, search "MyChart", open the app, select [place], and log in with your MyChart username and password.  Due to Covid, a mask is required upon entering the hospital/clinic. If you do not have a mask, one will be given to you upon arrival. For doctor visits, patients may have 1 support person aged 18 or older with them. For treatment visits, patients cannot have anyone with them due to current Covid guidelines and our immunocompromised population.   

## 2021-09-19 NOTE — Progress Notes (Signed)
Patient received ativan 0.5 mg per Dr. Worthy Flank orders for onset of nausea and stomach discomfort.  Upon discharge, patient noted improvement in symptoms and felt well to go home. Patient walked with patient to lobby. Wife to pick patient up.

## 2021-09-19 NOTE — Progress Notes (Signed)
Nutrition Follow-up:  Patient with stage IV laryngeal cancer followed by Dr Earlie Server and Isidore Moos. Patient receiving cisplatin and radiation.  Met with patient during infusion.  Patient reports that he does not have the robust appetite that he usually does and treatments are starting to effect taste.  Also reports dry mouth especially at night.  Reports some nausea.  Constipation is relieved with miralax.  Reports that he has not gotten any ensure yet.  Wife had a hard time finding it in the grocery store.  Says that he usually eats cereal and banana or oatmeal for breakfast.  Lunch is egg and sausage and toast (usually would have double the amount).  Mid afternoon may have ice cream. Dinner is meat (chicken wings 4-5 or ham), mashed potatoes or green beans or peas.    SLP notes reviewed  Medications: viscous lidocaine  Labs: reviewed  Anthropometrics:   Weight 142 lb 2 oz today  141 lb 2 ox on 1/23 UBW: 148 lb    NUTRITION DIAGNOSIS: Predicted sub optimal energy intake continues    INTERVENTION:  Discussed where to purchase oral nutrition supplements (online, drug store, grocery store). Encouraged 1-2 between meals Reviewed ways to add calories (butters, gravies, sauces Encouraged patient to be proactive in taking nausea medications. Contact information given     MONITORING, EVALUATION, GOAL: weight trends, intake   NEXT VISIT: Monday, Feb 6 during infusion  Jayquan Bradsher B. Zenia Resides, [place], [place] Registered Dietitian [phone] (mobile)

## 2021-09-19 NOTE — Progress Notes (Signed)
Per Dr Julien Nordmann it is okay to proceed with pre-hydration fluids and using labs from 09/12/21.

## 2021-09-20 ENCOUNTER — Ambulatory Visit
Admission: RE | Admit: 2021-09-20 | Discharge: 2021-09-20 | Disposition: A | Discharge status: Home / Self Care | Payer: Medicare Other | Source: Ambulatory Visit | Attending: Radiation Oncology | Admitting: Radiation Oncology

## 2021-09-20 DIAGNOSIS — C320 Malignant neoplasm of glottis: Secondary | ICD-10-CM | POA: Diagnosis not present

## 2021-09-20 DIAGNOSIS — Z510 Encounter for antineoplastic radiation therapy: Secondary | ICD-10-CM | POA: Diagnosis not present

## 2021-09-21 ENCOUNTER — Ambulatory Visit
Admission: RE | Admit: 2021-09-21 | Discharge: 2021-09-21 | Disposition: A | Discharge status: Home / Self Care | Payer: Medicare Other | Source: Ambulatory Visit | Attending: Radiation Oncology | Admitting: Radiation Oncology

## 2021-09-21 ENCOUNTER — Other Ambulatory Visit: Payer: Self-pay

## 2021-09-21 DIAGNOSIS — J9601 Acute respiratory failure with hypoxia: Secondary | ICD-10-CM

## 2021-09-21 DIAGNOSIS — Z510 Encounter for antineoplastic radiation therapy: Secondary | ICD-10-CM | POA: Insufficient documentation

## 2021-09-21 DIAGNOSIS — C320 Malignant neoplasm of glottis: Secondary | ICD-10-CM | POA: Insufficient documentation

## 2021-09-21 HISTORY — DX: Acute respiratory failure with hypoxia: J96.01

## 2021-09-22 ENCOUNTER — Ambulatory Visit
Admission: RE | Admit: 2021-09-22 | Discharge: 2021-09-22 | Disposition: A | Discharge status: Home / Self Care | Payer: Medicare Other | Source: Ambulatory Visit | Attending: Radiation Oncology | Admitting: Radiation Oncology

## 2021-09-22 DIAGNOSIS — C320 Malignant neoplasm of glottis: Secondary | ICD-10-CM | POA: Diagnosis not present

## 2021-09-22 DIAGNOSIS — Z510 Encounter for antineoplastic radiation therapy: Secondary | ICD-10-CM | POA: Diagnosis not present

## 2021-09-23 ENCOUNTER — Ambulatory Visit
Admission: RE | Admit: 2021-09-23 | Discharge: 2021-09-23 | Disposition: A | Discharge status: Home / Self Care | Payer: Medicare Other | Source: Ambulatory Visit | Attending: Radiation Oncology | Admitting: Radiation Oncology

## 2021-09-23 ENCOUNTER — Other Ambulatory Visit: Payer: Self-pay

## 2021-09-23 DIAGNOSIS — J9601 Acute respiratory failure with hypoxia: Secondary | ICD-10-CM | POA: Diagnosis not present

## 2021-09-23 DIAGNOSIS — I5043 Acute on chronic combined systolic (congestive) and diastolic (congestive) heart failure: Secondary | ICD-10-CM | POA: Diagnosis not present

## 2021-09-23 DIAGNOSIS — J181 Lobar pneumonia, unspecified organism: Secondary | ICD-10-CM | POA: Diagnosis present

## 2021-09-23 DIAGNOSIS — I739 Peripheral vascular disease, unspecified: Secondary | ICD-10-CM | POA: Diagnosis not present

## 2021-09-23 DIAGNOSIS — C109 Malignant neoplasm of oropharynx, unspecified: Secondary | ICD-10-CM | POA: Diagnosis not present

## 2021-09-23 DIAGNOSIS — B370 Candidal stomatitis: Secondary | ICD-10-CM | POA: Diagnosis not present

## 2021-09-23 DIAGNOSIS — R0600 Dyspnea, unspecified: Secondary | ICD-10-CM | POA: Diagnosis not present

## 2021-09-23 DIAGNOSIS — I5189 Other ill-defined heart diseases: Secondary | ICD-10-CM | POA: Diagnosis not present

## 2021-09-23 DIAGNOSIS — C3412 Malignant neoplasm of upper lobe, left bronchus or lung: Secondary | ICD-10-CM | POA: Diagnosis not present

## 2021-09-23 DIAGNOSIS — J3801 Paralysis of vocal cords and larynx, unilateral: Secondary | ICD-10-CM | POA: Diagnosis not present

## 2021-09-23 DIAGNOSIS — J189 Pneumonia, unspecified organism: Secondary | ICD-10-CM | POA: Diagnosis present

## 2021-09-23 DIAGNOSIS — A4189 Other specified sepsis: Secondary | ICD-10-CM | POA: Diagnosis not present

## 2021-09-23 DIAGNOSIS — C320 Malignant neoplasm of glottis: Secondary | ICD-10-CM | POA: Diagnosis not present

## 2021-09-23 DIAGNOSIS — R0609 Other forms of dyspnea: Secondary | ICD-10-CM | POA: Diagnosis not present

## 2021-09-23 DIAGNOSIS — E785 Hyperlipidemia, unspecified: Secondary | ICD-10-CM | POA: Diagnosis not present

## 2021-09-23 DIAGNOSIS — Z66 Do not resuscitate: Secondary | ICD-10-CM | POA: Diagnosis not present

## 2021-09-23 DIAGNOSIS — E440 Moderate protein-calorie malnutrition: Secondary | ICD-10-CM | POA: Diagnosis not present

## 2021-09-23 DIAGNOSIS — G40209 Localization-related (focal) (partial) symptomatic epilepsy and epileptic syndromes with complex partial seizures, not intractable, without status epilepticus: Secondary | ICD-10-CM | POA: Diagnosis present

## 2021-09-23 DIAGNOSIS — I10 Essential (primary) hypertension: Secondary | ICD-10-CM | POA: Diagnosis not present

## 2021-09-23 DIAGNOSIS — Z8616 Personal history of COVID-19: Secondary | ICD-10-CM | POA: Diagnosis not present

## 2021-09-23 DIAGNOSIS — R5381 Other malaise: Secondary | ICD-10-CM | POA: Diagnosis not present

## 2021-09-23 DIAGNOSIS — R509 Fever, unspecified: Secondary | ICD-10-CM | POA: Diagnosis not present

## 2021-09-23 DIAGNOSIS — C7931 Secondary malignant neoplasm of brain: Secondary | ICD-10-CM | POA: Diagnosis not present

## 2021-09-23 DIAGNOSIS — C329 Malignant neoplasm of larynx, unspecified: Secondary | ICD-10-CM | POA: Diagnosis not present

## 2021-09-23 DIAGNOSIS — J90 Pleural effusion, not elsewhere classified: Secondary | ICD-10-CM | POA: Diagnosis not present

## 2021-09-23 DIAGNOSIS — Z20822 Contact with and (suspected) exposure to covid-19: Secondary | ICD-10-CM | POA: Diagnosis not present

## 2021-09-23 DIAGNOSIS — A419 Sepsis, unspecified organism: Secondary | ICD-10-CM | POA: Diagnosis not present

## 2021-09-23 DIAGNOSIS — I110 Hypertensive heart disease with heart failure: Secondary | ICD-10-CM | POA: Diagnosis not present

## 2021-09-23 DIAGNOSIS — I272 Pulmonary hypertension, unspecified: Secondary | ICD-10-CM | POA: Diagnosis not present

## 2021-09-23 DIAGNOSIS — F17210 Nicotine dependence, cigarettes, uncomplicated: Secondary | ICD-10-CM | POA: Diagnosis not present

## 2021-09-23 DIAGNOSIS — Z515 Encounter for palliative care: Secondary | ICD-10-CM | POA: Diagnosis not present

## 2021-09-23 DIAGNOSIS — Z6824 Body mass index (BMI) 24.0-24.9, adult: Secondary | ICD-10-CM | POA: Diagnosis not present

## 2021-09-23 DIAGNOSIS — R0602 Shortness of breath: Secondary | ICD-10-CM | POA: Diagnosis not present

## 2021-09-23 DIAGNOSIS — F419 Anxiety disorder, unspecified: Secondary | ICD-10-CM | POA: Diagnosis present

## 2021-09-23 DIAGNOSIS — R000 Tachycardia, unspecified: Secondary | ICD-10-CM | POA: Diagnosis not present

## 2021-09-23 DIAGNOSIS — I5021 Acute systolic (congestive) heart failure: Secondary | ICD-10-CM | POA: Diagnosis not present

## 2021-09-23 MED FILL — Fosaprepitant Dimeglumine For IV Infusion 150 MG (Base Eq): INTRAVENOUS | Qty: 5 | Status: AC

## 2021-09-23 MED FILL — Dexamethasone Sodium Phosphate Inj 100 MG/10ML: INTRAMUSCULAR | Qty: 1 | Status: AC

## 2021-09-24 ENCOUNTER — Encounter: Payer: Self-pay | Admitting: Family Medicine

## 2021-09-26 ENCOUNTER — Emergency Department (HOSPITAL_COMMUNITY): Payer: Medicare Other

## 2021-09-26 ENCOUNTER — Inpatient Hospital Stay: Payer: Medicare Other | Admitting: Physician Assistant

## 2021-09-26 ENCOUNTER — Telehealth: Payer: Self-pay

## 2021-09-26 ENCOUNTER — Other Ambulatory Visit: Payer: Self-pay

## 2021-09-26 ENCOUNTER — Inpatient Hospital Stay: Payer: Medicare Other

## 2021-09-26 ENCOUNTER — Ambulatory Visit: Payer: Medicare Other

## 2021-09-26 ENCOUNTER — Inpatient Hospital Stay (HOSPITAL_COMMUNITY)
Admission: EM | Admit: 2021-09-26 | Discharge: 2021-10-19 | DRG: 871 | Disposition: E | Payer: Medicare Other | Attending: Internal Medicine | Admitting: Internal Medicine

## 2021-09-26 ENCOUNTER — Encounter (HOSPITAL_COMMUNITY): Payer: Self-pay

## 2021-09-26 DIAGNOSIS — J3801 Paralysis of vocal cords and larynx, unilateral: Secondary | ICD-10-CM | POA: Diagnosis present

## 2021-09-26 DIAGNOSIS — Z8521 Personal history of malignant neoplasm of larynx: Secondary | ICD-10-CM

## 2021-09-26 DIAGNOSIS — A419 Sepsis, unspecified organism: Secondary | ICD-10-CM | POA: Diagnosis not present

## 2021-09-26 DIAGNOSIS — E876 Hypokalemia: Secondary | ICD-10-CM | POA: Diagnosis present

## 2021-09-26 DIAGNOSIS — Z888 Allergy status to other drugs, medicaments and biological substances status: Secondary | ICD-10-CM

## 2021-09-26 DIAGNOSIS — R0600 Dyspnea, unspecified: Secondary | ICD-10-CM | POA: Diagnosis not present

## 2021-09-26 DIAGNOSIS — Z801 Family history of malignant neoplasm of trachea, bronchus and lung: Secondary | ICD-10-CM

## 2021-09-26 DIAGNOSIS — C320 Malignant neoplasm of glottis: Secondary | ICD-10-CM | POA: Diagnosis not present

## 2021-09-26 DIAGNOSIS — C329 Malignant neoplasm of larynx, unspecified: Secondary | ICD-10-CM | POA: Diagnosis not present

## 2021-09-26 DIAGNOSIS — C3412 Malignant neoplasm of upper lobe, left bronchus or lung: Secondary | ICD-10-CM | POA: Diagnosis present

## 2021-09-26 DIAGNOSIS — I5043 Acute on chronic combined systolic (congestive) and diastolic (congestive) heart failure: Secondary | ICD-10-CM | POA: Diagnosis not present

## 2021-09-26 DIAGNOSIS — E785 Hyperlipidemia, unspecified: Secondary | ICD-10-CM | POA: Diagnosis present

## 2021-09-26 DIAGNOSIS — J181 Lobar pneumonia, unspecified organism: Secondary | ICD-10-CM | POA: Diagnosis present

## 2021-09-26 DIAGNOSIS — I110 Hypertensive heart disease with heart failure: Secondary | ICD-10-CM | POA: Diagnosis present

## 2021-09-26 DIAGNOSIS — I739 Peripheral vascular disease, unspecified: Secondary | ICD-10-CM | POA: Diagnosis present

## 2021-09-26 DIAGNOSIS — Z66 Do not resuscitate: Secondary | ICD-10-CM | POA: Diagnosis not present

## 2021-09-26 DIAGNOSIS — A4189 Other specified sepsis: Secondary | ICD-10-CM | POA: Diagnosis present

## 2021-09-26 DIAGNOSIS — J189 Pneumonia, unspecified organism: Secondary | ICD-10-CM | POA: Diagnosis present

## 2021-09-26 DIAGNOSIS — Z91041 Radiographic dye allergy status: Secondary | ICD-10-CM

## 2021-09-26 DIAGNOSIS — Z8042 Family history of malignant neoplasm of prostate: Secondary | ICD-10-CM

## 2021-09-26 DIAGNOSIS — I5021 Acute systolic (congestive) heart failure: Secondary | ICD-10-CM | POA: Diagnosis not present

## 2021-09-26 DIAGNOSIS — I10 Essential (primary) hypertension: Secondary | ICD-10-CM | POA: Diagnosis present

## 2021-09-26 DIAGNOSIS — J9601 Acute respiratory failure with hypoxia: Secondary | ICD-10-CM | POA: Diagnosis not present

## 2021-09-26 DIAGNOSIS — Z83438 Family history of other disorder of lipoprotein metabolism and other lipidemia: Secondary | ICD-10-CM

## 2021-09-26 DIAGNOSIS — B370 Candidal stomatitis: Secondary | ICD-10-CM | POA: Diagnosis present

## 2021-09-26 DIAGNOSIS — C109 Malignant neoplasm of oropharynx, unspecified: Secondary | ICD-10-CM | POA: Diagnosis present

## 2021-09-26 DIAGNOSIS — H9193 Unspecified hearing loss, bilateral: Secondary | ICD-10-CM | POA: Diagnosis present

## 2021-09-26 DIAGNOSIS — R509 Fever, unspecified: Secondary | ICD-10-CM | POA: Diagnosis not present

## 2021-09-26 DIAGNOSIS — R5381 Other malaise: Secondary | ICD-10-CM | POA: Diagnosis not present

## 2021-09-26 DIAGNOSIS — C7931 Secondary malignant neoplasm of brain: Secondary | ICD-10-CM | POA: Diagnosis present

## 2021-09-26 DIAGNOSIS — Z6824 Body mass index (BMI) 24.0-24.9, adult: Secondary | ICD-10-CM | POA: Diagnosis not present

## 2021-09-26 DIAGNOSIS — F419 Anxiety disorder, unspecified: Secondary | ICD-10-CM | POA: Diagnosis present

## 2021-09-26 DIAGNOSIS — Z7951 Long term (current) use of inhaled steroids: Secondary | ICD-10-CM

## 2021-09-26 DIAGNOSIS — Z20822 Contact with and (suspected) exposure to covid-19: Secondary | ICD-10-CM | POA: Diagnosis present

## 2021-09-26 DIAGNOSIS — Z923 Personal history of irradiation: Secondary | ICD-10-CM

## 2021-09-26 DIAGNOSIS — R54 Age-related physical debility: Secondary | ICD-10-CM | POA: Diagnosis present

## 2021-09-26 DIAGNOSIS — I5189 Other ill-defined heart diseases: Secondary | ICD-10-CM | POA: Diagnosis not present

## 2021-09-26 DIAGNOSIS — E440 Moderate protein-calorie malnutrition: Secondary | ICD-10-CM | POA: Diagnosis present

## 2021-09-26 DIAGNOSIS — Z79899 Other long term (current) drug therapy: Secondary | ICD-10-CM

## 2021-09-26 DIAGNOSIS — F17210 Nicotine dependence, cigarettes, uncomplicated: Secondary | ICD-10-CM | POA: Diagnosis present

## 2021-09-26 DIAGNOSIS — Z515 Encounter for palliative care: Secondary | ICD-10-CM

## 2021-09-26 DIAGNOSIS — I252 Old myocardial infarction: Secondary | ICD-10-CM

## 2021-09-26 DIAGNOSIS — Z8616 Personal history of COVID-19: Secondary | ICD-10-CM

## 2021-09-26 DIAGNOSIS — J90 Pleural effusion, not elsewhere classified: Secondary | ICD-10-CM | POA: Diagnosis not present

## 2021-09-26 DIAGNOSIS — R000 Tachycardia, unspecified: Secondary | ICD-10-CM | POA: Diagnosis not present

## 2021-09-26 DIAGNOSIS — G40209 Localization-related (focal) (partial) symptomatic epilepsy and epileptic syndromes with complex partial seizures, not intractable, without status epilepticus: Secondary | ICD-10-CM | POA: Diagnosis present

## 2021-09-26 DIAGNOSIS — Z8249 Family history of ischemic heart disease and other diseases of the circulatory system: Secondary | ICD-10-CM

## 2021-09-26 DIAGNOSIS — I272 Pulmonary hypertension, unspecified: Secondary | ICD-10-CM | POA: Diagnosis present

## 2021-09-26 DIAGNOSIS — Z7189 Other specified counseling: Secondary | ICD-10-CM

## 2021-09-26 DIAGNOSIS — Z9221 Personal history of antineoplastic chemotherapy: Secondary | ICD-10-CM

## 2021-09-26 DIAGNOSIS — N400 Enlarged prostate without lower urinary tract symptoms: Secondary | ICD-10-CM | POA: Diagnosis present

## 2021-09-26 DIAGNOSIS — R0602 Shortness of breath: Secondary | ICD-10-CM | POA: Diagnosis not present

## 2021-09-26 DIAGNOSIS — R0609 Other forms of dyspnea: Secondary | ICD-10-CM | POA: Diagnosis not present

## 2021-09-26 DIAGNOSIS — I2510 Atherosclerotic heart disease of native coronary artery without angina pectoris: Secondary | ICD-10-CM | POA: Diagnosis present

## 2021-09-26 DIAGNOSIS — Z9861 Coronary angioplasty status: Secondary | ICD-10-CM

## 2021-09-26 DIAGNOSIS — Z7982 Long term (current) use of aspirin: Secondary | ICD-10-CM

## 2021-09-26 LAB — URINALYSIS, ROUTINE W REFLEX MICROSCOPIC
Bacteria, UA: NONE SEEN
Bilirubin Urine: NEGATIVE
Glucose, UA: NEGATIVE mg/dL
Ketones, ur: NEGATIVE mg/dL
Nitrite: NEGATIVE
Protein, ur: 100 mg/dL — AB
Specific Gravity, Urine: 1.016 (ref 1.005–1.030)
WBC, UA: >50 WBC/hpf — ABNORMAL HIGH (ref 0–5)
pH: 7 (ref 5.0–8.0)

## 2021-09-26 LAB — COMPREHENSIVE METABOLIC PANEL
ALT: 13 U/L (ref 0–44)
AST: 11 U/L — ABNORMAL LOW (ref 15–41)
Albumin: 3.4 g/dL — ABNORMAL LOW (ref 3.5–5.0)
Alkaline Phosphatase: 59 U/L (ref 38–126)
Anion gap: 10 (ref 5–15)
BUN: 22 mg/dL (ref 8–23)
CO2: 22 mmol/L (ref 22–32)
Calcium: 8.7 mg/dL — ABNORMAL LOW (ref 8.9–10.3)
Chloride: 98 mmol/L (ref 98–111)
Creatinine, Ser: 0.89 mg/dL (ref 0.61–1.24)
GFR, Estimated: >60 mL/min (ref >60)
Glucose, Bld: 111 mg/dL — ABNORMAL HIGH (ref 70–99)
Potassium: 3.7 mmol/L (ref 3.5–5.1)
Sodium: 130 mmol/L — ABNORMAL LOW (ref 135–145)
Total Bilirubin: 0.5 mg/dL (ref 0.3–1.2)
Total Protein: 7.1 g/dL (ref 6.5–8.1)

## 2021-09-26 LAB — CBC WITH DIFFERENTIAL/PLATELET
Abs Immature Granulocytes: 0.16 10*3/uL — ABNORMAL HIGH (ref 0.00–0.07)
Basophils Absolute: 0 10*3/uL (ref 0.0–0.1)
Basophils Relative: 0 %
Eosinophils Absolute: 0 10*3/uL (ref 0.0–0.5)
Eosinophils Relative: 0 %
HCT: 35 % — ABNORMAL LOW (ref 39.0–52.0)
Hemoglobin: 11.9 g/dL — ABNORMAL LOW (ref 13.0–17.0)
Immature Granulocytes: 1 %
Lymphocytes Relative: 4 %
Lymphs Abs: 0.8 10*3/uL (ref 0.7–4.0)
MCH: 29 pg (ref 26.0–34.0)
MCHC: 34 g/dL (ref 30.0–36.0)
MCV: 85.4 fL (ref 80.0–100.0)
Monocytes Absolute: 1.9 10*3/uL — ABNORMAL HIGH (ref 0.1–1.0)
Monocytes Relative: 10 %
Neutro Abs: 16.6 10*3/uL — ABNORMAL HIGH (ref 1.7–7.7)
Neutrophils Relative %: 85 %
Platelets: 184 10*3/uL (ref 150–400)
RBC: 4.1 MIL/uL — ABNORMAL LOW (ref 4.22–5.81)
RDW: 15.3 % (ref 11.5–15.5)
WBC: 19.6 10*3/uL — ABNORMAL HIGH (ref 4.0–10.5)
nRBC: 0 % (ref 0.0–0.2)

## 2021-09-26 LAB — PROTIME-INR
INR: 1.1 (ref 0.8–1.2)
Prothrombin Time: 14.1 seconds (ref 11.4–15.2)

## 2021-09-26 LAB — APTT: aPTT: 30 seconds (ref 24–36)

## 2021-09-26 LAB — LACTIC ACID, PLASMA: Lactic Acid, Venous: 0.8 mmol/L (ref 0.5–1.9)

## 2021-09-26 LAB — RESP PANEL BY RT-PCR (FLU A&B, COVID) ARPGX2
Influenza A by PCR: NEGATIVE
Influenza B by PCR: NEGATIVE
SARS Coronavirus 2 by RT PCR: NEGATIVE

## 2021-09-26 MED ORDER — LACTATED RINGERS IV BOLUS (SEPSIS)
1000.0000 mL | Freq: Once | INTRAVENOUS | Status: AC
  Administered 2021-09-26: 1000 mL via INTRAVENOUS

## 2021-09-26 MED ORDER — ACETAMINOPHEN 325 MG PO TABS
650.0000 mg | ORAL_TABLET | Freq: Once | ORAL | Status: AC
  Administered 2021-09-26: 650 mg via ORAL
  Filled 2021-09-26: qty 2

## 2021-09-26 MED ORDER — ACETAMINOPHEN 650 MG RE SUPP
650.0000 mg | Freq: Once | RECTAL | Status: DC
  Filled 2021-09-26: qty 1

## 2021-09-26 MED ORDER — VANCOMYCIN HCL IN DEXTROSE 1-5 GM/200ML-% IV SOLN
1000.0000 mg | Freq: Once | INTRAVENOUS | Status: AC
  Administered 2021-09-26: 1000 mg via INTRAVENOUS
  Filled 2021-09-26: qty 200

## 2021-09-26 MED ORDER — SODIUM CHLORIDE 0.9 % IV SOLN
2.0000 g | Freq: Once | INTRAVENOUS | Status: AC
  Administered 2021-09-26: 2 g via INTRAVENOUS
  Filled 2021-09-26: qty 2

## 2021-09-26 MED ORDER — LACTATED RINGERS IV SOLN: INTRAVENOUS | Status: DC

## 2021-09-26 NOTE — Telephone Encounter (Signed)
Per pts wife, he has had a fever for 2 days and is going to see his PCP tomorrow to r/o COVID/FLU.

## 2021-09-26 NOTE — Telephone Encounter (Signed)
LVM for pt regarding appt

## 2021-09-26 NOTE — Sepsis Progress Note (Signed)
Following per sepsis protocol   

## 2021-09-26 NOTE — ED Provider Notes (Signed)
[facility] DEPT Provider Note   CSN: [id number] Arrival date & time: 10/09/2021  2122     History  Chief Complaint  Patient presents with   Fever    Jordan Mccullough is a 81 y.o. male.  HPI Patient is a 81 year old male with a history of stage IVa laryngeal squamous cell carcinoma, metastatic non-small cell lung cancer, who presents to the emergency department due to weakness.  His wife and daughter at bedside who provide most of the history.  His daughter is a Equities trader.  He has had 3 cycles of cisplatin and was scheduled to have his fourth cycle earlier today.  2 days ago he began having waxing and waning fevers as well as weakness and fatigue.  He noted a productive cough as well but date that this is baseline for him.  Also notes decreased appetite.  Patient had an episode of vomiting 3 days ago but denies any current nausea or vomiting.  No diarrhea.  No chest or abdominal pain.  No shortness of breath.  Due to his fever today he did not have his scheduled cisplatin.  States Tmax was 103 F.    Home Medications Prior to Admission medications   Medication Sig Start Date End Date Taking? Authorizing Provider  amLODipine (NORVASC) 5 MG tablet TAKE 1 TABLET BY MOUTH EVERY DAY 08/24/21   McGowen, Adrian Blackwater, MD  aspirin 81 MG chewable tablet Chew 81 mg by mouth daily.    [provider]  atorvastatin (LIPITOR) 20 MG tablet TAKE 1 TABLET BY MOUTH EVERY DAY 08/24/21   McGowen, Adrian Blackwater, MD  diphenhydrAMINE (BENADRYL) 50 MG tablet Take 50mg  of Benadryl along with 50mg  of Prednisone 2 hours before CT scan. Patient not taking: Reported on 08/29/2021 04/06/21   [name], MD  feeding supplement, ENSURE COMPLETE, (ENSURE COMPLETE) LIQD Take 237 mLs by mouth 4 (four) times daily. 09/19/21   Eppie Gibson, MD  lamoTRIgine (LAMICTAL) 200 MG tablet Take 1 tablet in the morning and 1 and 1/2 tablets in the evening 10/05/20   Cameron Sprang, MD  lidocaine  (XYLOCAINE) 2 % solution Patient: Mix 1part 2% viscous lidocaine, 1part H20. Swish & swallow 70mL of diluted mixture, 66min before meals and at bedtime, up to QID 09/19/21   Eppie Gibson, MD  metoprolol tartrate (LOPRESSOR) 50 MG tablet TAKE 1 TABLET BY MOUTH TWICE A DAY 07/25/21   McGowen, Adrian Blackwater, MD  pantoprazole (PROTONIX) 40 MG tablet TAKE 1 TABLET BY MOUTH EVERY DAY 08/24/21   McGowen, Adrian Blackwater, MD  predniSONE (DELTASONE) 50 MG tablet Take 50mg  prednisone 13 hours, then 50mg  prednisone 7 hours, then 50mg  prednisone along with 50mg  of Benadryl 2 hours before CT Scan. Patient not taking: Reported on 08/29/2021 07/19/21   Tammi Sou, MD  prochlorperazine (COMPAZINE) 10 MG tablet Take 1 tablet (10 mg total) by mouth every 6 (six) hours as needed for nausea or vomiting. 09/06/21   [name], MD  SYMBICORT 160-4.5 MCG/ACT inhaler INHALE 1-2 PUFFS INTO THE LUNGS EVERY 12 HOURS. GARGLE AND SPIT AFTER USE. 09/20/21   McGowen, Adrian Blackwater, MD  tamsulosin (FLOMAX) 0.4 MG CAPS capsule Take 0.4 mg by mouth daily. 07/31/21   [provider]      Allergies    Quinolones, Wellbutrin [bupropion], Rosuvastatin, and Iodinated contrast media    Review of Systems   Review of Systems  All other systems reviewed and are negative. Ten systems reviewed and are negative for  acute change, except as noted in the HPI.   Physical Exam Updated Vital Signs BP 124/72    Pulse (!) 108    Temp (!) 102.9 F (39.4 C) (Oral)    Resp (!) 26    Ht 5\' 4"  (1.626 m)    Wt 64 kg    SpO2 91%    BMI 24.20 kg/m  Physical Exam Vitals and nursing note reviewed.  Constitutional:      General: He is not in acute distress.    Appearance: Normal appearance. He is not ill-appearing, toxic-appearing or diaphoretic.  HENT:     Head: Normocephalic and atraumatic.     Right Ear: External ear normal.     Left Ear: External ear normal.     Nose: Nose normal.     Mouth/Throat:     Mouth: Mucous membranes are moist.      Pharynx: Oropharynx is clear. No oropharyngeal exudate or posterior oropharyngeal erythema.  Eyes:     General: No scleral icterus.       Right eye: No discharge.        Left eye: No discharge.     Extraocular Movements: Extraocular movements intact.     Conjunctiva/sclera: Conjunctivae normal.  Cardiovascular:     Rate and Rhythm: Regular rhythm. Tachycardia present.     Pulses: Normal pulses.     Heart sounds: Normal heart sounds. No murmur heard.   No friction rub. No gallop.  Pulmonary:     Effort: Pulmonary effort is normal. No respiratory distress.     Breath sounds: Normal breath sounds. No stridor. No wheezing, rhonchi or rales.  Abdominal:     General: Abdomen is flat.     Palpations: Abdomen is soft.     Tenderness: There is no abdominal tenderness.     Comments: Abdomen is soft and nontender.  Moderately sized left inguinal hernia noted that is easily reducible.  No overlying skin changes.  Musculoskeletal:        General: Normal range of motion.     Cervical back: Normal range of motion and neck supple. No tenderness.  Skin:    General: Skin is warm and dry.  Neurological:     General: No focal deficit present.     Mental Status: He is alert and oriented to person, place, and time.  Psychiatric:        Mood and Affect: Mood normal.        Behavior: Behavior normal.    ED Results / Procedures / Treatments   Labs (all labs ordered are listed, but only abnormal results are displayed) Labs Reviewed  COMPREHENSIVE METABOLIC PANEL - Abnormal; Notable for the following components:      Result Value   Sodium 130 (*)    Glucose, Bld 111 (*)    Calcium 8.7 (*)    Albumin 3.4 (*)    AST 11 (*)    All other components within normal limits  CBC WITH DIFFERENTIAL/PLATELET - Abnormal; Notable for the following components:   WBC 19.6 (*)    RBC 4.10 (*)    Hemoglobin 11.9 (*)    HCT 35.0 (*)    Neutro Abs 16.6 (*)    Monocytes Absolute 1.9 (*)    Abs Immature  Granulocytes 0.16 (*)    All other components within normal limits  RESP PANEL BY RT-PCR (FLU A&B, COVID) ARPGX2  CULTURE, BLOOD (ROUTINE X 2)  CULTURE, BLOOD (ROUTINE X 2)  URINE CULTURE  EXPECTORATED SPUTUM  ASSESSMENT W GRAM STAIN, RFLX TO RESP C  MRSA NEXT GEN BY PCR, NASAL  LACTIC ACID, PLASMA  LACTIC ACID, PLASMA  URINALYSIS, ROUTINE W REFLEX MICROSCOPIC  PROTIME-INR  APTT  LEGIONELLA PNEUMOPHILA SEROGP 1 UR AG    EKG None  Radiology DG Chest 2 View  Result Date: 10/02/2021 CLINICAL DATA:  Currently undergoing chemotherapy, presents with fever and shortness of breath. EXAM: CHEST - 2 VIEW COMPARISON:  Chest CT 04/07/2021, with contrast. FINDINGS: Old left upper lobectomy. There are multiple left hilar surgical clips with left-sided volume loss, scarring in the left upper lung field. There is increased masslike fullness in the left hilar region approaching 4 cm which could be due to technical and positional differences or tumor recurrence. On the right, there is a small pleural effusion with consolidation changes in the peripheral mid and lower lung field involving both the right upper lobe periphery and the lateral basal lower lobe, findings consistent with multilobar pneumonia in this clinical setting. Remaining lungs are clear. Left sulci are sharp. There is osteopenia, degenerative changes of the spine and old left rotator cuff repair changes. An abdominal aortic endovascular repair graft is partially visible. IMPRESSION: 1. Airspace disease in the right upper and lower lobes consistent with multilobar pneumonia. Follow-up films to clearing recommended. 2. Small parapneumonic right pleural effusion. 3. Old left upper lobectomy with increased left hilar soft tissue fullness approaching 4 cm worrisome for tumor recurrence, or could be exaggerated due to positional or technical differences. Follow-up chest CT recommended. 4. Osteopenia and degenerative change. Electronically Signed   By:  Telford Nab M.D.   On: 09/28/2021 22:40    Procedures .Critical Care Performed by: Rayna Sexton, PA-C Authorized by: Rayna Sexton, PA-C   Critical care provider statement:    Critical care time (minutes):  30   Critical care was necessary to treat or prevent imminent or life-threatening deterioration of the following conditions:  Sepsis   Critical care was time spent personally by me on the following activities:  Development of treatment plan with patient or surrogate, discussions with consultants, evaluation of patient's response to treatment, examination of patient, ordering and review of laboratory studies, ordering and review of radiographic studies, ordering and performing treatments and interventions, pulse oximetry, re-evaluation of patient's condition and review of old charts   Medications Ordered in ED Medications  lactated ringers infusion (has no administration in time range)  vancomycin (VANCOCIN) IVPB 1000 mg/200 mL premix (has no administration in time range)  ceFEPIme (MAXIPIME) 2 g in sodium chloride 0.9 % 100 mL IVPB (has no administration in time range)  lactated ringers bolus 1,000 mL (1,000 mLs Intravenous New Bag/Given 09/25/2021 2306)    And  lactated ringers bolus 1,000 mL (1,000 mLs Intravenous New Bag/Given 10/03/2021 2307)  acetaminophen (TYLENOL) suppository 650 mg (has no administration in time range)   ED Course/ Medical Decision Making/ A&P                           Medical Decision Making Amount and/or Complexity of Data Reviewed Labs: ordered. Radiology: ordered. ECG/medicine tests: ordered.  Risk OTC drugs. Prescription drug management. Decision regarding hospitalization.  Pt is a 81 y.o. male who presents to the emergency department with weakness, fevers, productive cough.  Patient has a history of metastatic cancer and is currently receiving chemotherapy.  He has been experiencing waxing and waning fevers for the past 2 days.  Labs: CBC  with a white  count of 19.6, hemoglobin of 11.9, hematocrit of 35, neutrophils of 16.6, absolute immature granulocytes 0.16. CMP with a sodium of 130, glucose of 111, calcium of 8.7, albumin of 3.4, AST of 11. Lactic acid 0.8. Respiratory panel is negative. Blood cultures obtained. UA, urine culture, PT/INR, APTT are in process.  Imaging: Chest x-ray showing IMPRESSION: 1. Airspace disease in the right upper and lower lobes consistent with multilobar pneumonia. Follow-up films to clearing recommended. 2. Small parapneumonic right pleural effusion. 3. Old left upper lobectomy with increased left hilar soft tissue fullness approaching 4 cm worrisome for tumor recurrence, or could be exaggerated due to positional or technical differences. Follow-up chest CT recommended. 4. Osteopenia and degenerative change. Electronically Signed   By: Telford Nab M.D.   On: 09/22/2021 22:40    I, Rayna Sexton, PA-C, personally reviewed and evaluated these images and lab results as part of my medical decision-making.  Chest x-ray concerning for multilobar pneumonia.  Patient with a significant leukocytosis of 19.6 and neutrophilia of 16.6.  Febrile at 102.9 F.  Appears consistent with sepsis.  Normotensive with a lactic acid of 0.8.  Does not meet criteria for septic shock.  Patient started on cefepime as well as vancomycin.  IV fluid bolus.  Tylenol for fever.  Patient will require admission for further management.  We will discuss with the medicine team.  Note: Portions of this report may have been transcribed using voice recognition software. Every effort was made to ensure accuracy; however, inadvertent computerized transcription errors may be present.   Final Clinical Impression(s) / ED Diagnoses Final diagnoses:  Multifocal pneumonia  Sepsis, due to unspecified organism, unspecified whether acute organ dysfunction present [facility])   Rx / DC Orders ED Discharge Orders     None         Rayna Sexton, PA-C 09/27/2021 2310    Wynona Dove A, DO 09/28/21 0131

## 2021-09-26 NOTE — ED Triage Notes (Signed)
Pt reports with fever of 103 at home. Pt is actively undergoing chemo, he is in his 4th week. The wife states that he missed it today because of his fever.

## 2021-09-26 NOTE — Progress Notes (Signed)
A consult was received from an ED physician for vancomycin and cefepime per pharmacy dosing.  The patient's profile has been reviewed for ht/wt/allergies/indication/available labs.   A one time order has been placed for vancomycin 1gm and cefepime 2gm.    Further antibiotics/pharmacy consults should be ordered by admitting physician if indicated.                       Thank you, Dolly Rias RPh 10/08/2021, 10:58 PM

## 2021-09-27 ENCOUNTER — Telehealth: Payer: Self-pay | Admitting: Family Medicine

## 2021-09-27 ENCOUNTER — Ambulatory Visit: Payer: Medicare Other | Admitting: Family Medicine

## 2021-09-27 ENCOUNTER — Inpatient Hospital Stay (HOSPITAL_COMMUNITY): Payer: Medicare Other

## 2021-09-27 ENCOUNTER — Ambulatory Visit: Payer: Medicare Other

## 2021-09-27 DIAGNOSIS — I10 Essential (primary) hypertension: Secondary | ICD-10-CM

## 2021-09-27 DIAGNOSIS — C3412 Malignant neoplasm of upper lobe, left bronchus or lung: Secondary | ICD-10-CM

## 2021-09-27 DIAGNOSIS — A419 Sepsis, unspecified organism: Secondary | ICD-10-CM

## 2021-09-27 DIAGNOSIS — C320 Malignant neoplasm of glottis: Secondary | ICD-10-CM | POA: Diagnosis not present

## 2021-09-27 DIAGNOSIS — G40209 Localization-related (focal) (partial) symptomatic epilepsy and epileptic syndromes with complex partial seizures, not intractable, without status epilepticus: Secondary | ICD-10-CM | POA: Diagnosis not present

## 2021-09-27 DIAGNOSIS — J189 Pneumonia, unspecified organism: Secondary | ICD-10-CM | POA: Diagnosis not present

## 2021-09-27 LAB — CBC
HCT: 32.3 % — ABNORMAL LOW (ref 39.0–52.0)
Hemoglobin: 10.9 g/dL — ABNORMAL LOW (ref 13.0–17.0)
MCH: 29.6 pg (ref 26.0–34.0)
MCHC: 33.7 g/dL (ref 30.0–36.0)
MCV: 87.8 fL (ref 80.0–100.0)
Platelets: 148 10*3/uL — ABNORMAL LOW (ref 150–400)
RBC: 3.68 MIL/uL — ABNORMAL LOW (ref 4.22–5.81)
RDW: 15.2 % (ref 11.5–15.5)
WBC: 17 10*3/uL — ABNORMAL HIGH (ref 4.0–10.5)
nRBC: 0 % (ref 0.0–0.2)

## 2021-09-27 LAB — EXPECTORATED SPUTUM ASSESSMENT W GRAM STAIN, RFLX TO RESP C

## 2021-09-27 LAB — BASIC METABOLIC PANEL
Anion gap: 8 (ref 5–15)
BUN: 17 mg/dL (ref 8–23)
CO2: 24 mmol/L (ref 22–32)
Calcium: 8.2 mg/dL — ABNORMAL LOW (ref 8.9–10.3)
Chloride: 100 mmol/L (ref 98–111)
Creatinine, Ser: 0.88 mg/dL (ref 0.61–1.24)
GFR, Estimated: >60 mL/min (ref >60)
Glucose, Bld: 105 mg/dL — ABNORMAL HIGH (ref 70–99)
Potassium: 3.6 mmol/L (ref 3.5–5.1)
Sodium: 132 mmol/L — ABNORMAL LOW (ref 135–145)

## 2021-09-27 LAB — MRSA NEXT GEN BY PCR, NASAL: MRSA by PCR Next Gen: NOT DETECTED

## 2021-09-27 LAB — HIV ANTIBODY (ROUTINE TESTING W REFLEX): HIV Screen 4th Generation wRfx: NONREACTIVE

## 2021-09-27 MED ORDER — PANTOPRAZOLE SODIUM 40 MG PO TBEC
40.0000 mg | DELAYED_RELEASE_TABLET | Freq: Every day | ORAL | Status: DC
  Administered 2021-09-27 – 2021-09-29 (×3): 40 mg via ORAL
  Filled 2021-09-27 (×3): qty 1

## 2021-09-27 MED ORDER — ACETAMINOPHEN 325 MG PO TABS
650.0000 mg | ORAL_TABLET | Freq: Four times a day (QID) | ORAL | Status: DC | PRN
  Administered 2021-09-27 – 2021-09-28 (×3): 650 mg via ORAL
  Filled 2021-09-27 (×3): qty 2

## 2021-09-27 MED ORDER — LAMOTRIGINE 100 MG PO TABS
200.0000 mg | ORAL_TABLET | Freq: Every day | ORAL | Status: DC
  Administered 2021-09-27 – 2021-09-29 (×3): 200 mg via ORAL
  Filled 2021-09-27 (×3): qty 2

## 2021-09-27 MED ORDER — LACTATED RINGERS IV SOLN: INTRAVENOUS | Status: DC

## 2021-09-27 MED ORDER — LAMOTRIGINE 100 MG PO TABS: 100.0000 mg | ORAL_TABLET | Freq: Two times a day (BID) | ORAL | Status: DC

## 2021-09-27 MED ORDER — LIDOCAINE VISCOUS HCL 2 % MT SOLN
15.0000 mL | Freq: Four times a day (QID) | OROMUCOSAL | Status: DC
  Administered 2021-09-27: 15 mL via OROMUCOSAL
  Filled 2021-09-27: qty 15

## 2021-09-27 MED ORDER — LAMOTRIGINE 100 MG PO TABS
300.0000 mg | ORAL_TABLET | Freq: Every day | ORAL | Status: DC
  Administered 2021-09-27 – 2021-09-29 (×3): 300 mg via ORAL
  Filled 2021-09-27 (×3): qty 3

## 2021-09-27 MED ORDER — MOMETASONE FURO-FORMOTEROL FUM 200-5 MCG/ACT IN AERO
2.0000 | INHALATION_SPRAY | Freq: Two times a day (BID) | RESPIRATORY_TRACT | Status: DC
  Administered 2021-09-27 – 2021-09-29 (×5): 2 via RESPIRATORY_TRACT
  Filled 2021-09-27: qty 8.8

## 2021-09-27 MED ORDER — ENOXAPARIN SODIUM 40 MG/0.4ML IJ SOSY
40.0000 mg | PREFILLED_SYRINGE | INTRAMUSCULAR | Status: DC
  Administered 2021-09-27 – 2021-09-29 (×3): 40 mg via SUBCUTANEOUS
  Filled 2021-09-27 (×3): qty 0.4

## 2021-09-27 MED ORDER — ATORVASTATIN CALCIUM 20 MG PO TABS
20.0000 mg | ORAL_TABLET | Freq: Every day | ORAL | Status: DC
  Administered 2021-09-27 – 2021-09-28 (×2): 20 mg via ORAL
  Filled 2021-09-27: qty 2
  Filled 2021-09-27: qty 1

## 2021-09-27 MED ORDER — SODIUM CHLORIDE 0.9 % IV SOLN
3.0000 g | Freq: Four times a day (QID) | INTRAVENOUS | Status: DC
  Administered 2021-09-27 – 2021-09-30 (×15): 3 g via INTRAVENOUS
  Filled 2021-09-27 (×18): qty 8

## 2021-09-27 MED ORDER — ENSURE ENLIVE PO LIQD
237.0000 mL | Freq: Two times a day (BID) | ORAL | Status: DC
  Administered 2021-09-28 – 2021-09-29 (×4): 237 mL via ORAL

## 2021-09-27 MED ORDER — TAMSULOSIN HCL 0.4 MG PO CAPS
0.4000 mg | ORAL_CAPSULE | Freq: Every day | ORAL | Status: DC
  Administered 2021-09-27 – 2021-09-29 (×3): 0.4 mg via ORAL
  Filled 2021-09-27 (×3): qty 1

## 2021-09-27 MED ORDER — ASPIRIN 81 MG PO CHEW
81.0000 mg | CHEWABLE_TABLET | Freq: Every day | ORAL | Status: DC
  Administered 2021-09-27 – 2021-09-29 (×3): 81 mg via ORAL
  Filled 2021-09-27 (×3): qty 1

## 2021-09-27 NOTE — Assessment & Plan Note (Addendum)
Patient was on amlodipine and metoprolol at home.

## 2021-09-27 NOTE — Telephone Encounter (Signed)
Pts daughter called to notify pt would not be in today. He has been admitted to [facility] hospital.

## 2021-09-27 NOTE — Assessment & Plan Note (Addendum)
Concern for aspiration pneumonia due to difficulty with swallowing.    On dysphagia 2 diet.  on  Unasyn and Zithromax.  Urinary Legionella antigen was positive.   Blood cultures and Urine culture negative.  Has been initiated on comfort care due to increased work of breathing and respiratory distress

## 2021-09-27 NOTE — Progress Notes (Signed)
Same day note  Patient seen and examined at bedside.  Patient was admitted to the hospital for fever  At the time of my evaluation, patient complains of throat discomfort and feels tired but breathing better.  Has mild cough with phlegm.  Physical examination reveals average built male, on nasal cannula hoarse voice.  Coarse breath sounds noted.  Bilateral lower extremity trace edema noted.  Laboratory data and imaging was reviewed  Assessment and Plan  Multifocal pneumonia- (present on admission) Concern for aspiration pneumonia due to difficulty with swallowing.  Seen by speech therapy today and recommended dysphagia 2 diet..  Continue with antibiotics for now.  Had tachycardia and fever on presentation.  Received 2 L of bolus in the ED.  Continue with Unasyn for now.  Follow blood cultures sputum culture.  Blood cultures negative in less than 12 hours.  Temperature max of 102.9 F.   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  Patient follows up with Dr. Julien Nordmann as outpatient.   History of seizures. Continue Lamictal.   Essential hypertension- (present on admission) On amlodipine and metoprolol at home.  Currently on hold.  Primary cancer of left upper lobe of lung ([place])- (present on admission) Despite h/o recurrent metastatic dz to brain in 2016, this adenocarcinoma appears to be in remission currently.    Tried to call the patient's spouse on the phone listed in the computer but was unable to reach her.   No Charge  Signed,  Delila Pereyra, MD [facility]

## 2021-09-27 NOTE — Assessment & Plan Note (Addendum)
Was undergoing chemo + radiation.  Patient followed up with Dr. Julien Nordmann as outpatient.  Received radiation treatment during hospitalization.

## 2021-09-27 NOTE — Progress Notes (Signed)
Speech Language Pathology Treatment: Dysphagia  Patient Details Name: ZAKARIA SEDOR MRN: [id number] DOB: 11-21-1940 Today's Date: 09/27/2021 Time: 6967-8938 SLP Time Calculation (min) (ACUTE ONLY): 15 min  Assessment / Plan / Recommendation Clinical Impression  Patient seen by SLP with his spouse and daughter both present in room with focus on education. SLP had planned for MBS today but patient is refusing. SLP spent time talking about information received from secure EPIC chat with patient's OP SLP regarding decline in swallow function over next 4-5 weeks secondary to edema increase from radiation and likely need for PEG. Per daughter, PEG had been discussed for when patient's swallow declined. Patient appears with some denial telling SLP, "hopefully I wont need a feeding tube" but SLP telling patient that he most likely will as expected progression during radiation and chemo is increasing dysphagia. SLP stressed importance of patient continuing with his swallowing exercises (learned from OP SLP) and eating and drinking to keep swallow muscles working. Plan for continued SLP f/u while patient in hospital to continue education and determination of if/when a repeat MBS would be beneficial.    HPI HPI: Patient is a 81 y.o. male with PMH: HTN, CAD, seizure disorder, NSCLC(adenocarcinoma) with mets to brain, currently on long term remission since 2019, recent diagnosis of oropharyngeal cancer (squamous cell) currently undergoing radiation and chemo. He presented to ED due to generalized weakness and fevers for past 2 days, productive cough (patient says is baseline), decreased appetite and vomiting three days prior to admission but no N/V currently. Temperature taken at home was 103 degrees F. In ED, patient with tachycardia, CXR revealed Airspace disease in the right upper and lower lobes consistent with multilobar pneumonia. Patient also c/o soreness in throat. He had been seen by OP SLP (most recent 1/26)  and had MBS on 1/25 which showed min oral and mild pharyngeal phase dysphagia.      SLP Plan  Continue with current plan of care      Recommendations for follow up therapy are one component of a multi-disciplinary discharge planning process, led by the attending physician.  Recommendations may be updated based on patient status, additional functional criteria and insurance authorization.    Recommendations  Diet recommendations: Dysphagia 2 (fine chop);Thin liquid Liquids provided via: Cup;Straw Medication Administration: Whole meds with puree Supervision: Patient able to self feed Compensations: Slow rate;Small sips/bites;Follow solids with liquid;Clear throat intermittently Postural Changes and/or Swallow Maneuvers: Seated upright 90 degrees                Oral Care Recommendations: Oral care BID;Patient independent with oral care Follow Up Recommendations: Outpatient SLP Assistance recommended at discharge: PRN SLP Visit Diagnosis: Dysphagia, unspecified (R13.10) Plan: Continue with current plan of care          Sonia Baller, MA, CCC-SLP Speech Therapy

## 2021-09-27 NOTE — ED Notes (Signed)
Pt desat to 88% placed on 2 LNC

## 2021-09-27 NOTE — Progress Notes (Deleted)
OFFICE VISIT  09/27/2021  CC: No chief complaint on file.    Patient is a 80 y.o. male who presents for "hernia issue"  HPI: Patient went to [facility] emergency department yesterday evening for fatigue.  He has started chemo had some fever.  He was diagnosed with pneumonia.  On exam they did note a moderate size left inguinal hernia that was reducible.  White blood cell count was 17.6 K.  CMET pretty unremarkable.  He was given vancomycin and cefepime in the emergency department.  It is not clear from the note what he was discharged home on. Patient with history of bilateral inguinal hernia repair.  10/12/2021 in the ED-->Chest x-ray showing IMPRESSION: 1. Airspace disease in the right upper and lower lobes consistent with multilobar pneumonia. Follow-up films to clearing recommended. 2. Small parapneumonic right pleural effusion. 3. Old left upper lobectomy with increased left hilar soft tissue fullness approaching 4 cm worrisome for tumor recurrence, or could be exaggerated due to positional or technical differences. Follow-up chest CT recommended. 4. Osteopenia and degenerative change.  Past Medical History:  Diagnosis Date   AAA (abdominal aortic aneurysm)    s/p repair (aortoiliac bipass; with persistent endograft leak in inferior mesenteric artery)--stable as of 01/2017 vascular f/u.   Asymptomatic cholelithiasis 07/2015   Incidental finding on PET CT   Back pain 04/19/2016   BPH with elevated PSA    PSA signif rise 11/2017 (5.4 in 2015 to 31.21 November 2017. Bx 03/2018 benign.   Coronary artery disease    MI 1992, S/P  PTCA; negative stress test in November 2011 with no ischemia.    COVID-19 virus infection 12/07/2020   Diverticulosis    Elevated PSA 01/2018   01/22/18:  PSA 26.5.  Prostate MRI with abnormality->subsequent prostate bx BENIGN 04/12/18.PSA 72019 stbl at 24.1->rose to 36 03/2020->rec'd return to urol.   Gross hematuria 05/2018   Occurred 2+ mo's after prostate bx: attributed to  bx by urol, treated empirically with keflex.  Urol initially suspected gross hem was sequela of recent prost bx. BUT recurrence of gross hem prompted full hematuria w/u 11/15/18.   Hearing loss    Bilateral    Hepatic steatosis    History of radiation therapy 11/10/13-12/12/13   lung,50Gy/23fx   Hyperlipidemia    Crestor= myalgias   Hypertension    Laryngeal mass 07/2021   with L cervical lymphadenopathy.  ENT->bx 07/29/21->invasive SCC   Lung cancer ([place]) 05/2013   non-small cell;  L upper lobectomy with mediastinal LN dissection, chemo, and radiation in 2014/2015. Remission until pt had questionable seizure 07/2015--MRI showed brain mets; palliative brain rad (stereotactic radiation therapy) started 08/25/15.  CT C/A/P clear 02/2016, 05/2016, 11/2016, 12/2018. MRI brain 12/2018 stable lesions/no new mets. Rpt 6 mo per onc.   Malignant neoplasm of upper lobe, left bronchus or lung ([place]) 10/29/2013   *?New spiculated 4.5cm mass in L upper lung field on CXR at [facility] in [place] 07/14/17.  Dr. Julien Nordmann (onc) recommended f/u CXR after abx course.  If mass unchanged then repeat CT chest.  Surveillance CT chest and MRI brain 06/2018 show no sign of dz.   Myocardial infarction [facility]) 1992   Dr Angelena Form     Obstructive uropathy    obstructive and reflux uropathy   Peripheral vascular disease ([place]) 08/2012   4.8x4.6 cm infrarenal abdominal aortic fusiform aneurysm, 1.5 cm right common iliac artery aneurysm.  Type II endoleak from inferior mesenteric artery--Vasc surg referred pt to interv  rad for possible embolization of the leak as of 07/17/16.   Pneumonia 09/06/2018   XRAY was done at [place] care.   S/P radiation therapy [facility] 08/25/15   Stereotactic radiation therapy: frontoparietal 18gy,posterior frontal lobe 20gy,   Seizure disorder ([place]) 2016/2017   as sequela of brain mets;  Grand mal seizure 07/2015, then got on keppra and was seizure-free until focal motor seizures of L side  of face began 02/2016- these responded well to up-titration of keppra but pt had adverse side effects so eventually pt had to be switched over to lamictal 07/2016--stable/seizure free on this med as of 09/2018 neuro f/u.   Tibial plateau fracture, right 03/2021   Tobacco dependence    "Quit" 1992, but pt has smoked "on and off" since that time   Unilateral vocal cord paralysis    left, onset s/p mediastinoscopy with mediastinal LN resection 2014.    Past Surgical History:  Procedure Laterality Date   ABDOMINAL AORTIC ENDOVASCULAR STENT GRAFT N/A 05/07/2014   Procedure: ABDOMINAL AORTIC ENDOVASCULAR STENT GRAFT;  Surgeon: Serafina Mitchell, MD;  Location: [place] [facility];  Service: Vascular;  Laterality: N/A;   Carotid duplex dopplers  07/2015   1-39% on R, no signif dz noted on L   COLONOSCOPY W/ POLYPECTOMY  2003    negative 2010,due 2020; Dr Olevia Perches   CORONARY ANGIOPLASTY     no stents   CYSTOSCOPY/RETROGRADE/URETEROSCOPY Bilateral 10/09/2012   Procedure: BILATERAL RETROGRADE bladder and urethral BIOPSY ;  Surgeon: Molli Hazard, MD;  Location: [facility];  Service: Urology;  Laterality: Bilateral;  BILATERAL RETROGRADE    EEG  08/05/15   Pt placed on keppra just prior to this test due to having ? seizure (MRI showed brain mets)   HERNIA REPAIR Bilateral    Inguinal   INGUINAL HERIIORRHAPHY BILATERALLY     IR ANGIOGRAM SELECTIVE EACH ADDITIONAL VESSEL  12/03/2017   IR ANGIOGRAM VISCERAL SELECTIVE  12/03/2017   IR ANGIOGRAM VISCERAL SELECTIVE  12/03/2017   IR EMBO ARTERIAL NOT HEMORR HEMANG INC GUIDE ROADMAPPING  12/03/2017   IR GENERIC HISTORICAL  07/20/2016   IR RADIOLOGIST EVAL & MGMT 07/20/2016 [facility] INTERV RAD   IR GENERIC HISTORICAL  08/02/2016   IR RADIOLOGIST EVAL & MGMT 08/02/2016 Sandi Mariscal, MD [facility] INTERV RAD   IR RADIOLOGIST EVAL & MGMT  11/08/2017   IR RADIOLOGIST EVAL & MGMT  01/01/2018   IR RADIOLOGIST EVAL & MGMT  08/27/2019   IR RADIOLOGIST EVAL & MGMT  09/16/2020   IR US GUIDE  VASC ACCESS RIGHT  12/03/2017   LARYNGOSCOPY AND ESOPHAGOSCOPY N/A 08/09/2021   Procedure: MICRO DIRECT LARYNGOSCOPY WITH BIOPSY AND ESOPHAGOSCOPY;  Surgeon: Melida Quitter, MD;  Location: [place];  Service: ENT;  Laterality: N/A;   MEDIASTINOSCOPY N/A 05/01/2013   Procedure: MEDIASTINOSCOPY;  Surgeon: Gaye Pollack, MD;  Location: [place] [facility];  Service: Thoracic;  Laterality: N/A;   PFTs  04/2013   Minimal obstructive airway disease   PILONIDAL CYST EXCISION     PROSTATE BIOPSY N/A 10/09/2012   NEG bx 04/12/18 as well.  Procedure: PROSTATIC URETHRAL BIOPSY--BPH--no evidence of malignancy;  Surgeon: Molli Hazard, MD;  Location: [facility];  Service: Urology;  Laterality: N/A;  PROSTATIC URETHRAL BIOPSY   PROSTATE BIOPSY  03/2018   NEG   PTCA  1992   ROTATOR CUFF REPAIR     Bilateral   THOROCOTOMY WITH LOBECTOMY Left 05/29/2013   Procedure: LEFT THOROCOTOMY WITH LEFT UPPER LOBE LOBECTOMY;  Surgeon:  Gaye Pollack, MD;  Location: [facility];  Service: Thoracic;  Laterality: Left;   TRANSTHORACIC ECHOCARDIOGRAM  07/2015   EF 50-55%, hypokin of inf myoc, grd I DD, mild MR, mod dilat of LA.   VIDEO BRONCHOSCOPY N/A 05/01/2013   Procedure: VIDEO BRONCHOSCOPY;  Surgeon: Gaye Pollack, MD;  Location: [place] [facility];  Service: Thoracic;  Laterality: N/A;    Facility-Administered Medications Prior to Visit  Medication Dose Route Frequency Provider Last Rate Last Admin   acetaminophen (TYLENOL) tablet 650 mg  650 mg Oral Q6H PRN Etta Quill, DO   650 mg at 09/27/21 0433   Ampicillin-Sulbactam (UNASYN) 3 g in sodium chloride 0.9 % 100 mL IVPB  3 g Intravenous Q6H Angela Adam, RPH 200 mL/hr at 09/27/21 0536 3 g at 09/27/21 0536   aspirin chewable tablet 81 mg  81 mg Oral Daily Jennette Kettle M, DO       atorvastatin (LIPITOR) tablet 20 mg  20 mg Oral Daily Alcario Drought, Jared M, DO       enoxaparin (LOVENOX) injection 40 mg  40 mg Subcutaneous Q24H Alcario Drought, Jared M, DO       lactated ringers infusion   Intravenous  Continuous Etta Quill, DO 75 mL/hr at 09/27/21 0141 New Bag at 09/27/21 0141   lamoTRIgine (LAMICTAL) tablet 100-200 mg  100-200 mg Oral BID Etta Quill, DO       lidocaine (XYLOCAINE) 2 % viscous mouth solution 15 mL  15 mL Mouth/Throat QID Jennette Kettle M, DO       mometasone-formoterol (DULERA) 200-5 MCG/ACT inhaler 2 puff  2 puff Inhalation BID Alcario Drought, Jared M, DO       pantoprazole (PROTONIX) EC tablet 40 mg  40 mg Oral Daily Alcario Drought, Jared M, DO       tamsulosin (FLOMAX) capsule 0.4 mg  0.4 mg Oral Daily Etta Quill, DO       Outpatient Medications Prior to Visit  Medication Sig Dispense Refill   amLODipine (NORVASC) 5 MG tablet TAKE 1 TABLET BY MOUTH EVERY DAY 90 tablet 1   aspirin 81 MG chewable tablet Chew 81 mg by mouth daily.     atorvastatin (LIPITOR) 20 MG tablet TAKE 1 TABLET BY MOUTH EVERY DAY 90 tablet 1   feeding supplement, ENSURE COMPLETE, (ENSURE COMPLETE) LIQD Take 237 mLs by mouth 4 (four) times daily. 9480 mL 10   lamoTRIgine (LAMICTAL) 200 MG tablet Take 1 tablet in the morning and 1 and 1/2 tablets in the evening 225 tablet 3   lidocaine (XYLOCAINE) 2 % solution Patient: Mix 1part 2% viscous lidocaine, 1part H20. Swish & swallow 92mL of diluted mixture, 107min before meals and at bedtime, up to QID 200 mL 3   metoprolol tartrate (LOPRESSOR) 50 MG tablet TAKE 1 TABLET BY MOUTH TWICE A DAY 180 tablet 0   pantoprazole (PROTONIX) 40 MG tablet TAKE 1 TABLET BY MOUTH EVERY DAY 90 tablet 0   prochlorperazine (COMPAZINE) 10 MG tablet Take 1 tablet (10 mg total) by mouth every 6 (six) hours as needed for nausea or vomiting. 30 tablet 0   SYMBICORT 160-4.5 MCG/ACT inhaler INHALE 1-2 PUFFS INTO THE LUNGS EVERY 12 HOURS. GARGLE AND SPIT AFTER USE. 10.2 each 11   tamsulosin (FLOMAX) 0.4 MG CAPS capsule Take 0.4 mg by mouth daily.      Allergies  Allergen Reactions   Quinolones Other (See Comments)    Pt with aneurism: need to avoid quinoline use b/c of weakening  of vascular wall that can be associated with quinolones.   Wellbutrin [Bupropion] Other (See Comments)    AVOID IN PT W/HX OF SEIZURES   Rosuvastatin Other (See Comments)    Myalgias and dark urine   Iodinated Contrast Media Hives    1 hive on lt cheek lasting approximately 1 hour on last 2 CT per pt; needs pre meds in future; 50 mg benadryl po 1 hr prior to exam per Dr. Weber Cooks 07/2014  11/2016 per Tery Sanfilippo, pt needs 13hr premeds per our policy.    ROS As per HPI  PE: Vitals with BMI 09/27/2021 09/27/2021 09/27/2021  Height - - -  Weight - - -  BMI - - -  Systolic 601 093 235  Diastolic 70 58 63  Pulse 62 51 88     Physical Exam  ***  LABS:  Last CBC Lab Results  Component Value Date   WBC 17.0 (H) 09/27/2021   HGB 10.9 (L) 09/27/2021   HCT 32.3 (L) 09/27/2021   MCV 87.8 09/27/2021   MCH 29.6 09/27/2021   RDW 15.2 09/27/2021   PLT 148 (L) 57/32/2025   Last metabolic panel Lab Results  Component Value Date   GLUCOSE 105 (H) 09/27/2021   NA 132 (L) 09/27/2021   K 3.6 09/27/2021   CL 100 09/27/2021   CO2 24 09/27/2021   BUN 17 09/27/2021   CREATININE 0.88 09/27/2021   GFRNONAA >60 09/27/2021   CALCIUM 8.2 (L) 09/27/2021   PHOS 4.0 08/05/2015   PROT 7.1 09/22/2021   ALBUMIN 3.4 (L) 10/12/2021   BILITOT 0.5 10/16/2021   ALKPHOS 59 10/07/2021   AST 11 (L) 10/13/2021   ALT 13 10/09/2021   ANIONGAP 8 09/27/2021   IMPRESSION AND PLAN:  No problem-specific Assessment & Plan notes found for this encounter.   An After Visit Summary was printed and given to the patient.  FOLLOW UP: No follow-ups on file.  Signed:  Crissie Sickles, MD           09/27/2021

## 2021-09-27 NOTE — H&P (Signed)
History and Physical    Patient: Jordan Mccullough BJS:[id number] DOB: October 26, 1940 DOA: 10/18/2021 DOS: the patient was seen and examined on 09/27/2021 PCP: Tammi Sou, MD  Patient coming from: Home  Chief Complaint:  Chief Complaint  Patient presents with   Fever    HPI: Jordan Mccullough is a 81 y.o. male with medical history significant of HTN, CAD, seizure disorder.  NSCLC (adenocarcinoma) with mets to brain, currently in long term remission since ~2019.   Recent new diagnosis of oropharyngeal cancer (squamous cell), currently undergoing radiation and chemo.  Patient presents to ED due to generalized weakness and fevers for past 2 days.  Also have productive cough as well but states this is baseline for him.  Has decreased appetitie.  Vomiting x3 days ago but no current N/V.  No diarrhea, no CP no abd pain.  No SOB.  Tm at home was 103.  Review of Systems: As mentioned in the history of present illness. All other systems reviewed and are negative. Past Medical History:  Diagnosis Date   AAA (abdominal aortic aneurysm)    s/p repair (aortoiliac bipass; with persistent endograft leak in inferior mesenteric artery)--stable as of 01/2017 vascular f/u.   Asymptomatic cholelithiasis 07/2015   Incidental finding on PET CT   Back pain 04/19/2016   BPH with elevated PSA    PSA signif rise 11/2017 (5.4 in 2015 to 31.21 November 2017. Bx 03/2018 benign.   Coronary artery disease    MI 1992, S/P  PTCA; negative stress test in November 2011 with no ischemia.    COVID-19 virus infection 12/07/2020   Diverticulosis    Elevated PSA 01/2018   01/22/18:  PSA 26.5.  Prostate MRI with abnormality->subsequent prostate bx BENIGN 04/12/18.PSA 72019 stbl at 24.1->rose to 36 03/2020->rec'd return to urol.   Gross hematuria 05/2018   Occurred 2+ mo's after prostate bx: attributed to bx by urol, treated empirically with keflex.  Urol initially suspected gross hem was sequela of recent prost bx. BUT recurrence of gross  hem prompted full hematuria w/u 11/15/18.   Hearing loss    Bilateral    Hepatic steatosis    History of radiation therapy 11/10/13-12/12/13   lung,50Gy/63fx   Hyperlipidemia    Crestor= myalgias   Hypertension    Laryngeal mass 07/2021   with L cervical lymphadenopathy.  ENT->bx 07/29/21->invasive SCC   Lung cancer ([place]) 05/2013   non-small cell;  L upper lobectomy with mediastinal LN dissection, chemo, and radiation in 2014/2015. Remission until pt had questionable seizure 07/2015--MRI showed brain mets; palliative brain rad (stereotactic radiation therapy) started 08/25/15.  CT C/A/P clear 02/2016, 05/2016, 11/2016, 12/2018. MRI brain 12/2018 stable lesions/no new mets. Rpt 6 mo per onc.   Malignant neoplasm of upper lobe, left bronchus or lung ([place]) 10/29/2013   *?New spiculated 4.5cm mass in L upper lung field on CXR at [facility] in [place] 07/14/17.  Dr. Julien Nordmann (onc) recommended f/u CXR after abx course.  If mass unchanged then repeat CT chest.  Surveillance CT chest and MRI brain 06/2018 show no sign of dz.   Myocardial infarction [facility]) 1992   Dr Angelena Form     Obstructive uropathy    obstructive and reflux uropathy   Peripheral vascular disease ([place]) 08/2012   4.8x4.6 cm infrarenal abdominal aortic fusiform aneurysm, 1.5 cm right common iliac artery aneurysm.  Type II endoleak from inferior mesenteric artery--Vasc surg referred pt to interv rad for possible embolization of the leak as of  07/17/16.   Pneumonia 09/06/2018   XRAY was done at [place] [facility].   S/P radiation therapy [facility] 08/25/15   Stereotactic radiation therapy: frontoparietal 18gy,posterior frontal lobe 20gy,   Seizure disorder ([place]) 2016/2017   as sequela of brain mets;  Grand mal seizure 07/2015, then got on keppra and was seizure-free until focal motor seizures of L side of face began 02/2016- these responded well to up-titration of keppra but pt had adverse side effects so eventually pt had to be switched  over to lamictal 07/2016--stable/seizure free on this med as of 09/2018 neuro f/u.   Tibial plateau fracture, right 03/2021   Tobacco dependence    "Quit" 1992, but pt has smoked "on and off" since that time   Unilateral vocal cord paralysis    left, onset s/p mediastinoscopy with mediastinal LN resection 2014.   Past Surgical History:  Procedure Laterality Date   ABDOMINAL AORTIC ENDOVASCULAR STENT GRAFT N/A 05/07/2014   Procedure: ABDOMINAL AORTIC ENDOVASCULAR STENT GRAFT;  Surgeon: Serafina Mitchell, MD;  Location: [place] [facility];  Service: Vascular;  Laterality: N/A;   Carotid duplex dopplers  07/2015   1-39% on R, no signif dz noted on L   COLONOSCOPY W/ POLYPECTOMY  2003    negative 2010,due 2020; Dr Olevia Perches   CORONARY ANGIOPLASTY     no stents   CYSTOSCOPY/RETROGRADE/URETEROSCOPY Bilateral 10/09/2012   Procedure: BILATERAL RETROGRADE bladder and urethral BIOPSY ;  Surgeon: Molli Hazard, MD;  Location: [facility];  Service: Urology;  Laterality: Bilateral;  BILATERAL RETROGRADE    EEG  08/05/15   Pt placed on keppra just prior to this test due to having ? seizure (MRI showed brain mets)   HERNIA REPAIR Bilateral    Inguinal   INGUINAL HERIIORRHAPHY BILATERALLY     IR ANGIOGRAM SELECTIVE EACH ADDITIONAL VESSEL  12/03/2017   IR ANGIOGRAM VISCERAL SELECTIVE  12/03/2017   IR ANGIOGRAM VISCERAL SELECTIVE  12/03/2017   IR EMBO ARTERIAL NOT HEMORR HEMANG INC GUIDE ROADMAPPING  12/03/2017   IR GENERIC HISTORICAL  07/20/2016   IR RADIOLOGIST EVAL & MGMT 07/20/2016 [facility] INTERV RAD   IR GENERIC HISTORICAL  08/02/2016   IR RADIOLOGIST EVAL & MGMT 08/02/2016 Sandi Mariscal, MD [facility] INTERV RAD   IR RADIOLOGIST EVAL & MGMT  11/08/2017   IR RADIOLOGIST EVAL & MGMT  01/01/2018   IR RADIOLOGIST EVAL & MGMT  08/27/2019   IR RADIOLOGIST EVAL & MGMT  09/16/2020   IR US GUIDE VASC ACCESS RIGHT  12/03/2017   LARYNGOSCOPY AND ESOPHAGOSCOPY N/A 08/09/2021   Procedure: MICRO DIRECT LARYNGOSCOPY WITH BIOPSY AND  ESOPHAGOSCOPY;  Surgeon: Melida Quitter, MD;  Location: [place];  Service: ENT;  Laterality: N/A;   MEDIASTINOSCOPY N/A 05/01/2013   Procedure: MEDIASTINOSCOPY;  Surgeon: Gaye Pollack, MD;  Location: [place] [facility];  Service: Thoracic;  Laterality: N/A;   PFTs  04/2013   Minimal obstructive airway disease   PILONIDAL CYST EXCISION     PROSTATE BIOPSY N/A 10/09/2012   NEG bx 04/12/18 as well.  Procedure: PROSTATIC URETHRAL BIOPSY--BPH--no evidence of malignancy;  Surgeon: Molli Hazard, MD;  Location: [facility];  Service: Urology;  Laterality: N/A;  PROSTATIC URETHRAL BIOPSY   PROSTATE BIOPSY  03/2018   NEG   PTCA  1992   ROTATOR CUFF REPAIR     Bilateral   THOROCOTOMY WITH LOBECTOMY Left 05/29/2013   Procedure: LEFT THOROCOTOMY WITH LEFT UPPER LOBE LOBECTOMY;  Surgeon: Gaye Pollack, MD;  Location: [place];  Service:  Thoracic;  Laterality: Left;   TRANSTHORACIC ECHOCARDIOGRAM  07/2015   EF 50-55%, hypokin of inf myoc, grd I DD, mild MR, mod dilat of LA.   VIDEO BRONCHOSCOPY N/A 05/01/2013   Procedure: VIDEO BRONCHOSCOPY;  Surgeon: Gaye Pollack, MD;  [facility];  Service: Thoracic;  Laterality: N/A;   Social History:  reports that he has been smoking cigarettes. He has a 24.00 pack-year smoking history. He has never used smokeless tobacco. He reports current alcohol use of about 15.0 standard drinks per week. He reports that he does not use drugs.  Allergies  Allergen Reactions   Quinolones Other (See Comments)    Pt with aneurism: need to avoid quinoline use b/c of weakening of vascular wall that can be associated with quinolones.   Wellbutrin [Bupropion] Other (See Comments)    AVOID IN PT W/HX OF SEIZURES   Rosuvastatin Other (See Comments)    Myalgias and dark urine   Iodinated Contrast Media Hives    1 hive on lt cheek lasting approximately 1 hour on last 2 CT per pt; needs pre meds in future; 50 mg benadryl po 1 hr prior to exam per Dr. Weber Cooks 07/2014  11/2016 per Tery Sanfilippo, pt needs  13hr premeds per our policy.    Family History  Problem Relation Age of Onset   Hypertension Mother    Prostate cancer Father        Prostate cancer   Cancer Father        Brain Tumor   Lung cancer Sister        NON SMOKER   Cancer Sister    Hyperlipidemia Sister    Hyperlipidemia Brother    Heart attack Brother    Hypertension Brother    Prostate cancer Brother    Diabetes Neg Hx    Stroke Neg Hx     Prior to Admission medications   Medication Sig Start Date End Date Taking? Authorizing Provider  amLODipine (NORVASC) 5 MG tablet TAKE 1 TABLET BY MOUTH EVERY DAY 08/24/21   McGowen, Adrian Blackwater, MD  aspirin 81 MG chewable tablet Chew 81 mg by mouth daily.    [provider]  atorvastatin (LIPITOR) 20 MG tablet TAKE 1 TABLET BY MOUTH EVERY DAY 08/24/21   McGowen, Adrian Blackwater, MD  feeding supplement, ENSURE COMPLETE, (ENSURE COMPLETE) LIQD Take 237 mLs by mouth 4 (four) times daily. 09/19/21   Eppie Gibson, MD  lamoTRIgine (LAMICTAL) 200 MG tablet Take 1 tablet in the morning and 1 and 1/2 tablets in the evening 10/05/20   Cameron Sprang, MD  lidocaine (XYLOCAINE) 2 % solution Patient: Mix 1part 2% viscous lidocaine, 1part H20. Swish & swallow 31mL of diluted mixture, 75min before meals and at bedtime, up to QID 09/19/21   Eppie Gibson, MD  metoprolol tartrate (LOPRESSOR) 50 MG tablet TAKE 1 TABLET BY MOUTH TWICE A DAY 07/25/21   McGowen, Adrian Blackwater, MD  pantoprazole (PROTONIX) 40 MG tablet TAKE 1 TABLET BY MOUTH EVERY DAY 08/24/21   McGowen, Adrian Blackwater, MD  prochlorperazine (COMPAZINE) 10 MG tablet Take 1 tablet (10 mg total) by mouth every 6 (six) hours as needed for nausea or vomiting. 09/06/21   [name], MD  SYMBICORT 160-4.5 MCG/ACT inhaler INHALE 1-2 PUFFS INTO THE LUNGS EVERY 12 HOURS. GARGLE AND SPIT AFTER USE. 09/20/21   McGowen, Adrian Blackwater, MD  tamsulosin (FLOMAX) 0.4 MG CAPS capsule Take 0.4 mg by mouth daily. 07/31/21   [provider]    Physical  Exam: Vitals:    09/24/2021 2300 09/23/2021 2330 10/07/2021 2341 09/27/21 0000  BP: 129/67 132/78  122/67  Pulse: 65 (!) 106 (!) 111 (!) 52  Resp:  (!) 34 (!) 27 15  Temp:    99.9 F (37.7 C)  TempSrc:    Oral  SpO2: 92% 91% (!) 88% 95%  Weight:      Height:       Constitutional: NAD, calm, comfortable Eyes: PERRL, lids and conjunctivae normal ENMT: Mucous membranes are moist. Posterior pharynx clear of any exudate or lesions.Normal dentition.  Neck: normal, supple, no masses, no thyromegaly Respiratory: clear to auscultation bilaterally, no wheezing, no crackles. Normal respiratory effort. No accessory muscle use.  Cardiovascular: IRR with mild tachycardia, no murmurs / rubs / gallops. Trace extremity edema. 2+ pedal pulses. No carotid bruits.  Abdomen: no tenderness, no masses palpated. No hepatosplenomegaly. Bowel sounds positive.  Musculoskeletal: no clubbing / cyanosis. No joint deformity upper and lower extremities. Good ROM, no contractures. Normal muscle tone.  Skin: no rashes, lesions, ulcers. No induration Neurologic: CN 2-12 grossly intact. Sensation intact, DTR normal. Strength 5/5 in all 4.  Psychiatric: Normal judgment and insight. Alert and oriented x 3. Normal mood.    Data Reviewed:  EKG = S.Tach, even when having irregular mild S.Tach at time of my exam, has P waves present.  WBC 19k, CXR shows multifocal PNA in RUL and RLL.  COVID and flu neg.  UA = ? Of UTI.   Assessment and Plan: * Multifocal pneumonia- (present on admission) Worried about aspiration PNA in setting of swallowing difficulties + oropharyngeal cancer.  Did just have barium swallow on 1/25. Patient meets criteria for sepsis at time of admission. Specifically the patient has at least 2 out of 4 SIRS criteria, namely: WBC, tachycardia, fever The currently suspected source of infection is multifocal pneumonia Lactic acid: 0.8 Blood pressure: 122/67 IVF: 2L bolus and 75 cc/hr Antibiotics: Unasyn BCx pending Check MRSA  PCR nares Check sputum cx UCx pending too as UA looked like it might show a UTI. Repeat CBC / BMP in AM.  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  Currently undergoing chemo + radiation. Adding Dr. Julien Nordmann to treatment team.  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 seizures, not intractable, without status epilepticus ([place])- (present on admission) Continue lamictal If unable to take POs, will need to switch to IV alternative  Essential hypertension- (present on admission) Hold home BP meds (amlodipine and metoprolol) in setting of sepsis  Primary cancer of left upper lobe of lung ([place])- (present on admission) Despite h/o recurrent metastatic dz to brain in 2016, this adenocarcinoma appears to be in remission currently.  Current active CA is squamous sell oropharyngeal CA.       Advance Care Planning:   Code Status: Full Code Full code for now per patient (despite h/o DNR in past when he had mets to brain)  Consults: None  Family Communication: No family in room  Severity of Illness: The appropriate patient status for this patient is INPATIENT. Inpatient status is judged to be reasonable and necessary in order to provide the required intensity of service to ensure the patient's safety. The patient's presenting symptoms, physical exam findings, and initial radiographic and laboratory data in the context of their chronic comorbidities is felt to place them at high risk for further clinical deterioration. Furthermore, it is not anticipated that the patient will be medically stable for discharge from the  hospital within 2 midnights of admission.   * I certify that at the point of admission it is my clinical judgment that the patient will require inpatient hospital care spanning beyond 2 midnights from the point of admission due to high intensity of service, high risk for further deterioration and  high frequency of surveillance required.*  Author: Etta Quill., DO 09/27/2021 1:42 AM  For on call review [email or web address].

## 2021-09-27 NOTE — Hospital Course (Addendum)
Jordan Mccullough is a 81 y.o. 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  Patient was febrile at home at 103 F.  In the ED, patient met SIRS criteria with leukocytosis tachycardia and fever but lactate was 0.8.  Patient received IV fluid bolus and IV antibiotic in the ED and was admitted hospital for further evaluation and treatment.  During hospitalization patient continued to have dyspnea and 2D echocardiogram was performed which showed reduced LV function.  Overnight on 09/28/2021 patient was having increased respiratory distress and received  IV Lasix and was placed on BiPAP.  Extensive conversation was held with the family who have requested initiation of comfort care for the patient.  Comfort care was initiated 09/30/2021.

## 2021-09-27 NOTE — Telephone Encounter (Signed)
FYI  Please see below

## 2021-09-27 NOTE — Assessment & Plan Note (Addendum)
Was on Lamictal at home

## 2021-09-27 NOTE — Assessment & Plan Note (Addendum)
Despite h/o recurrent metastatic dz to brain in 2016, this adenocarcinoma appears to be in remission

## 2021-09-27 NOTE — Plan of Care (Signed)

## 2021-09-27 NOTE — Progress Notes (Signed)
Pharmacy Antibiotic Note  Jordan Mccullough is a 81 y.o. male admitted on 09/27/2021 with a history of stage IVa laryngeal squamous cell carcinoma, metastatic non-small cell lung cancer, who presents to the emergency department due to weakness.  Pt had an episode of vomiting 3 days ago but denies any current nausea or vomiting. Pharmacy has been consulted to dose unasyn for aspiration pna.  Plan: Unasyn 3gm IV q6h Follow renal function and clinical course  Height: 5\' 4"  (162.6 cm) Weight: 64 kg (141 lb) IBW/kg (Calculated) : 59.2  Temp (24hrs), Avg:100.8 F (38.2 C), Min:99.6 F (37.6 C), Max:102.9 F (39.4 C)  Recent Labs  Lab 10/03/2021 2210  WBC 19.6*  CREATININE 0.89  LATICACIDVEN 0.8    Estimated Creatinine Clearance: 56.4 mL/min (by C-G formula based on SCr of 0.89 mg/dL).    Allergies  Allergen Reactions   Quinolones Other (See Comments)    Pt with aneurism: need to avoid quinoline use b/c of weakening of vascular wall that can be associated with quinolones.   Wellbutrin [Bupropion] Other (See Comments)    AVOID IN PT W/HX OF SEIZURES   Rosuvastatin Other (See Comments)    Myalgias and dark urine   Iodinated Contrast Media Hives    1 hive on lt cheek lasting approximately 1 hour on last 2 CT per pt; needs pre meds in future; 50 mg benadryl po 1 hr prior to exam per Dr. Weber Cooks 07/2014  11/2016 per Tery Sanfilippo, pt needs 13hr premeds per our policy.    Thank you for allowing pharmacy to be a part of this patients care.  Dolly Rias RPh 09/27/2021, 1:16 AM

## 2021-09-27 NOTE — Evaluation (Signed)
Clinical/Bedside Swallow Evaluation Patient Details  Name: Jordan Mccullough MRN: [id number] Date of Birth: Oct 15, 1940  Today's Date: 09/27/2021 Time: SLP Start Time (ACUTE ONLY): 1027 SLP Stop Time (ACUTE ONLY): 0930 SLP Time Calculation (min) (ACUTE ONLY): 35 min  Past Medical History:  Past Medical History:  Diagnosis Date   AAA (abdominal aortic aneurysm)    s/p repair (aortoiliac bipass; with persistent endograft leak in inferior mesenteric artery)--stable as of 01/2017 vascular f/u.   Asymptomatic cholelithiasis 07/2015   Incidental finding on PET CT   Back pain 04/19/2016   BPH with elevated PSA    PSA signif rise 11/2017 (5.4 in 2015 to 31.21 November 2017. Bx 03/2018 benign.   Coronary artery disease    MI 1992, S/P  PTCA; negative stress test in November 2011 with no ischemia.    COVID-19 virus infection 12/07/2020   Diverticulosis    Elevated PSA 01/2018   01/22/18:  PSA 26.5.  Prostate MRI with abnormality->subsequent prostate bx BENIGN 04/12/18.PSA 72019 stbl at 24.1->rose to 36 03/2020->rec'd return to urol.   Gross hematuria 05/2018   Occurred 2+ mo's after prostate bx: attributed to bx by urol, treated empirically with keflex.  Urol initially suspected gross hem was sequela of recent prost bx. BUT recurrence of gross hem prompted full hematuria w/u 11/15/18.   Hearing loss    Bilateral    Hepatic steatosis    History of radiation therapy 11/10/13-12/12/13   lung,50Gy/90fx   Hyperlipidemia    Crestor= myalgias   Hypertension    Laryngeal mass 07/2021   with L cervical lymphadenopathy.  ENT->bx 07/29/21->invasive SCC   Lung cancer ([place]) 05/2013   non-small cell;  L upper lobectomy with mediastinal LN dissection, chemo, and radiation in 2014/2015. Remission until pt had questionable seizure 07/2015--MRI showed brain mets; palliative brain rad (stereotactic radiation therapy) started 08/25/15.  CT C/A/P clear 02/2016, 05/2016, 11/2016, 12/2018. MRI brain 12/2018 stable lesions/no new mets. Rpt  6 mo per onc.   Malignant neoplasm of upper lobe, left bronchus or lung ([place]) 10/29/2013   *?New spiculated 4.5cm mass in L upper lung field on CXR at [facility] in [place] 07/14/17.  Dr. Julien Nordmann (onc) recommended f/u CXR after abx course.  If mass unchanged then repeat CT chest.  Surveillance CT chest and MRI brain 06/2018 show no sign of dz.   Myocardial infarction [facility]) 1992   Dr Angelena Form     Obstructive uropathy    obstructive and reflux uropathy   Peripheral vascular disease ([place]) 08/2012   4.8x4.6 cm infrarenal abdominal aortic fusiform aneurysm, 1.5 cm right common iliac artery aneurysm.  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   Pneumonia 09/06/2018   XRAY was done at [facility].   S/P radiation therapy [facility] 08/25/15   Stereotactic radiation therapy: frontoparietal 18gy,posterior frontal lobe 20gy,   Seizure disorder ([place]) 2016/2017   as sequela of brain mets;  Grand mal seizure 07/2015, then got on keppra and was seizure-free until focal motor seizures of L side of face began 02/2016- these responded well to up-titration of keppra but pt had adverse side effects so eventually pt had to be switched over to lamictal 07/2016--stable/seizure free on this med as of 09/2018 neuro f/u.   Tibial plateau fracture, right 03/2021   Tobacco dependence    "Quit" 1992, but pt has smoked "on and off" since that time   Unilateral  vocal cord paralysis    left, onset s/p mediastinoscopy with mediastinal LN resection 2014.   Past Surgical History:  Past Surgical History:  Procedure Laterality Date   ABDOMINAL AORTIC ENDOVASCULAR STENT GRAFT N/A 05/07/2014   Procedure: ABDOMINAL AORTIC ENDOVASCULAR STENT GRAFT;  Surgeon: Serafina Mitchell, MD;  Location: [facility];  Service: Vascular;  Laterality: N/A;   Carotid duplex dopplers  07/2015   1-39% on R, no signif dz noted on L   COLONOSCOPY  W/ POLYPECTOMY  2003    negative 2010,due 2020; Dr Olevia Perches   CORONARY ANGIOPLASTY     no stents   CYSTOSCOPY/RETROGRADE/URETEROSCOPY Bilateral 10/09/2012   Procedure: BILATERAL RETROGRADE bladder and urethral BIOPSY ;  Surgeon: Molli Hazard, MD;  Location: [facility];  Service: Urology;  Laterality: Bilateral;  BILATERAL RETROGRADE    EEG  08/05/15   Pt placed on keppra just prior to this test due to having ? seizure (MRI showed brain mets)   HERNIA REPAIR Bilateral    Inguinal   INGUINAL HERIIORRHAPHY BILATERALLY     IR ANGIOGRAM SELECTIVE EACH ADDITIONAL VESSEL  12/03/2017   IR ANGIOGRAM VISCERAL SELECTIVE  12/03/2017   IR ANGIOGRAM VISCERAL SELECTIVE  12/03/2017   IR EMBO ARTERIAL NOT HEMORR HEMANG INC GUIDE ROADMAPPING  12/03/2017   IR GENERIC HISTORICAL  07/20/2016   IR RADIOLOGIST EVAL & MGMT 07/20/2016 [facility] INTERV RAD   IR GENERIC HISTORICAL  08/02/2016   IR RADIOLOGIST EVAL & MGMT 08/02/2016 Sandi Mariscal, MD [facility] INTERV RAD   IR RADIOLOGIST EVAL & MGMT  11/08/2017   IR RADIOLOGIST EVAL & MGMT  01/01/2018   IR RADIOLOGIST EVAL & MGMT  08/27/2019   IR RADIOLOGIST EVAL & MGMT  09/16/2020   IR US GUIDE VASC ACCESS RIGHT  12/03/2017   LARYNGOSCOPY AND ESOPHAGOSCOPY N/A 08/09/2021   Procedure: MICRO DIRECT LARYNGOSCOPY WITH BIOPSY AND ESOPHAGOSCOPY;  Surgeon: Melida Quitter, MD;  Location: [facility];  Service: ENT;  Laterality: N/A;   MEDIASTINOSCOPY N/A 05/01/2013   Procedure: MEDIASTINOSCOPY;  Surgeon: Gaye Pollack, MD;  Location: [facility];  Service: Thoracic;  Laterality: N/A;   PFTs  04/2013   Minimal obstructive airway disease   PILONIDAL CYST EXCISION     PROSTATE BIOPSY N/A 10/09/2012   NEG bx 04/12/18 as well.  Procedure: PROSTATIC URETHRAL BIOPSY--BPH--no evidence of malignancy;  Surgeon: Molli Hazard, MD;  [facility];  Service: Urology;  Laterality: N/A;  PROSTATIC URETHRAL BIOPSY   PROSTATE BIOPSY  03/2018   NEG   PTCA  1992   ROTATOR CUFF REPAIR     Bilateral    THOROCOTOMY WITH LOBECTOMY Left 05/29/2013   Procedure: LEFT THOROCOTOMY WITH LEFT UPPER LOBE LOBECTOMY;  Surgeon: Gaye Pollack, MD;  Location: [facility];  Service: Thoracic;  Laterality: Left;   TRANSTHORACIC ECHOCARDIOGRAM  07/2015   EF 50-55%, hypokin of inf myoc, grd I DD, mild MR, mod dilat of LA.   VIDEO BRONCHOSCOPY N/A 05/01/2013   Procedure: VIDEO BRONCHOSCOPY;  Surgeon: Gaye Pollack, MD;  Location: [facility];  Service: Thoracic;  Laterality: N/A;   HPI:  Patient is a 81 y.o. male with PMH: HTN, CAD, seizure disorder, NSCLC(adenocarcinoma) with mets to brain, currently on long term remission since 2019, recent diagnosis of oropharyngeal cancer (squamous cell) currently undergoing radiation and chemo. He presented to ED due to generalized weakness and fevers for past 2 days, productive cough (patient says is baseline), decreased appetite and vomiting three days prior to admission but no  N/V currently. Temperature taken at home was 103 degrees F. In ED, patient with tachycardia, CXR revealed Airspace disease in the right upper and lower lobes consistent with multilobar pneumonia. Patient also c/o soreness in throat. He had been seen by OP SLP (most recent 1/26) and had MBS on 1/25 which showed min oral and mild pharyngeal phase dysphagia.    Assessment / Plan / Recommendation  Clinical Impression  Patient presents with clinical s/s of dysphagia as per this bedside swallow evaluation. He did have MBS on 1/25 which showed min oral and mild pharyngeal phase dysphagia and recommended regular texture solids and thin liquids with precautions. Currently patient is here with suspected aspiration PNA. He reported that at home he has been drinking mainly fluids but will eat very soft, moist solids (noodles in soups, peas, etc). He does currently endorse some soreness/discomfort in his throat. SLP observed him with PO intake of puree solids (applesauce) and thin liquids (water). With liquids he exhibited multiple  (not more than 3) swallows with sips but no coughing or throat clearing. He did not exhibit any difficulty with puree solids. Patient's voice is severely hoarse and largely aphonic. SLP secure chatted on EPIC with OP SLP that patient was seeing for dysphagia to discuss this case. Plan to initiate Dys 2 solids, thin liquids diet and will proceed with objective swallow study (MBS) to determine if any changes in swallow function since 1/25 MBS. SLP Visit Diagnosis: Dysphagia, unspecified (R13.10)    Aspiration Risk  Mild aspiration risk;Moderate aspiration risk    Diet Recommendation Dysphagia 2 (Fine chop);Thin liquid   Liquid Administration via: Straw;Cup Medication Administration: Whole meds with puree Supervision: Patient able to self feed Compensations: Slow rate;Small sips/bites;Follow solids with liquid;Clear throat intermittently Postural Changes: Seated upright at 90 degrees    Other  Recommendations Oral Care Recommendations: Oral care BID;Patient independent with oral care    Recommendations for follow up therapy are one component of a multi-disciplinary discharge planning process, led by the attending physician.  Recommendations may be updated based on patient status, additional functional criteria and insurance authorization.  Follow up Recommendations Outpatient SLP      Assistance Recommended at Discharge PRN  Functional Status Assessment Patient has had a recent decline in their functional status and demonstrates the ability to make significant improvements in function in a reasonable and predictable amount of time.  Frequency and Duration min 2x/week  1 week       Prognosis Prognosis for Safe Diet Advancement: Good      Swallow Study   General Date of Onset: 10/04/2021 HPI: Patient is a 81 y.o. male with PMH: HTN, CAD, seizure disorder, NSCLC(adenocarcinoma) with mets to brain, currently on long term remission since 2019, recent diagnosis of oropharyngeal cancer  (squamous cell) currently undergoing radiation and chemo. He presented to ED due to generalized weakness and fevers for past 2 days, productive cough (patient says is baseline), decreased appetite and vomiting three days prior to admission but no N/V currently. Temperature taken at home was 103 degrees F. In ED, patient with tachycardia, CXR revealed Airspace disease in the right upper and lower lobes consistent with multilobar pneumonia. Patient also c/o soreness in throat. He had been seen by OP SLP (most recent 1/26) and had MBS on 1/25 which showed min oral and mild pharyngeal phase dysphagia. Type of Study: Bedside Swallow Evaluation Previous Swallow Assessment: MBS 1/25 Diet Prior to this Study: NPO Temperature Spikes Noted: Yes (100.6 0542 2/7) Respiratory Status: Nasal  cannula History of Recent Intubation: No Behavior/Cognition: Alert;Cooperative;Pleasant mood Oral Cavity Assessment: Other (comment) (mildly sticky but clear secretions) Oral Care Completed by SLP: Yes Oral Cavity - Dentition: Adequate natural dentition Vision: Functional for self-feeding Self-Feeding Abilities: Able to feed self Patient Positioning: Upright in bed Baseline Vocal Quality: Hoarse;Other (comment);Aphonic Volitional Cough: Strong Volitional Swallow: Able to elicit    Oral/Motor/Sensory Function Overall Oral Motor/Sensory Function: Within functional limits   Ice Chips     Thin Liquid Thin Liquid: Impaired Presentation: Self Fed;Straw Pharyngeal  Phase Impairments: Multiple swallows    Nectar Thick     Honey Thick     Puree Puree: Within functional limits Presentation: Self Fed   Solid     Solid: Not tested      Sonia Baller, MA, CCC-SLP Speech Therapy

## 2021-09-28 ENCOUNTER — Ambulatory Visit
Admission: RE | Admit: 2021-09-28 | Discharge: 2021-09-28 | Disposition: A | Discharge status: Home / Self Care | Payer: Medicare Other | Source: Ambulatory Visit | Attending: Radiation Oncology | Admitting: Radiation Oncology

## 2021-09-28 ENCOUNTER — Inpatient Hospital Stay (HOSPITAL_COMMUNITY): Payer: Medicare Other

## 2021-09-28 ENCOUNTER — Encounter: Payer: Self-pay | Admitting: Family Medicine

## 2021-09-28 DIAGNOSIS — R0609 Other forms of dyspnea: Secondary | ICD-10-CM

## 2021-09-28 DIAGNOSIS — I10 Essential (primary) hypertension: Secondary | ICD-10-CM | POA: Diagnosis not present

## 2021-09-28 DIAGNOSIS — R0600 Dyspnea, unspecified: Secondary | ICD-10-CM

## 2021-09-28 DIAGNOSIS — R5381 Other malaise: Secondary | ICD-10-CM

## 2021-09-28 DIAGNOSIS — B370 Candidal stomatitis: Secondary | ICD-10-CM

## 2021-09-28 DIAGNOSIS — J189 Pneumonia, unspecified organism: Secondary | ICD-10-CM | POA: Diagnosis not present

## 2021-09-28 DIAGNOSIS — C329 Malignant neoplasm of larynx, unspecified: Secondary | ICD-10-CM

## 2021-09-28 LAB — CBC
HCT: 31.5 % — ABNORMAL LOW (ref 39.0–52.0)
Hemoglobin: 10.7 g/dL — ABNORMAL LOW (ref 13.0–17.0)
MCH: 29.5 pg (ref 26.0–34.0)
MCHC: 34 g/dL (ref 30.0–36.0)
MCV: 86.8 fL (ref 80.0–100.0)
Platelets: 146 10*3/uL — ABNORMAL LOW (ref 150–400)
RBC: 3.63 MIL/uL — ABNORMAL LOW (ref 4.22–5.81)
RDW: 15.4 % (ref 11.5–15.5)
WBC: 12.2 10*3/uL — ABNORMAL HIGH (ref 4.0–10.5)
nRBC: 0 % (ref 0.0–0.2)

## 2021-09-28 LAB — COMPREHENSIVE METABOLIC PANEL
ALT: 13 U/L (ref 0–44)
AST: 16 U/L (ref 15–41)
Albumin: 2.8 g/dL — ABNORMAL LOW (ref 3.5–5.0)
Alkaline Phosphatase: 54 U/L (ref 38–126)
Anion gap: 7 (ref 5–15)
BUN: 16 mg/dL (ref 8–23)
CO2: 26 mmol/L (ref 22–32)
Calcium: 8.3 mg/dL — ABNORMAL LOW (ref 8.9–10.3)
Chloride: 101 mmol/L (ref 98–111)
Creatinine, Ser: 0.89 mg/dL (ref 0.61–1.24)
GFR, Estimated: >60 mL/min (ref >60)
Glucose, Bld: 114 mg/dL — ABNORMAL HIGH (ref 70–99)
Potassium: 3.8 mmol/L (ref 3.5–5.1)
Sodium: 134 mmol/L — ABNORMAL LOW (ref 135–145)
Total Bilirubin: 0.4 mg/dL (ref 0.3–1.2)
Total Protein: 6.3 g/dL — ABNORMAL LOW (ref 6.5–8.1)

## 2021-09-28 LAB — URINE CULTURE: Culture: 40000 — AB

## 2021-09-28 LAB — PHOSPHORUS: Phosphorus: 2.6 mg/dL (ref 2.5–4.6)

## 2021-09-28 LAB — MAGNESIUM: Magnesium: 1.6 mg/dL — ABNORMAL LOW (ref 1.7–2.4)

## 2021-09-28 MED ORDER — AZITHROMYCIN 250 MG PO TABS
500.0000 mg | ORAL_TABLET | Freq: Every day | ORAL | Status: DC
  Administered 2021-09-28: 500 mg via ORAL
  Filled 2021-09-28: qty 2

## 2021-09-28 MED ORDER — FLUCONAZOLE 100 MG PO TABS
100.0000 mg | ORAL_TABLET | Freq: Every day | ORAL | Status: DC
  Administered 2021-09-28 – 2021-09-29 (×2): 100 mg via ORAL
  Filled 2021-09-28 (×2): qty 1

## 2021-09-28 MED ORDER — ACETAMINOPHEN 325 MG PO TABS
650.0000 mg | ORAL_TABLET | Freq: Four times a day (QID) | ORAL | Status: DC | PRN
  Administered 2021-09-28 – 2021-09-29 (×3): 650 mg via ORAL
  Filled 2021-09-28 (×3): qty 2

## 2021-09-28 MED ORDER — LEVALBUTEROL HCL 0.63 MG/3ML IN NEBU
0.6300 mg | INHALATION_SOLUTION | Freq: Three times a day (TID) | RESPIRATORY_TRACT | Status: DC
  Administered 2021-09-28 – 2021-09-30 (×7): 0.63 mg via RESPIRATORY_TRACT
  Filled 2021-09-28 (×7): qty 3

## 2021-09-28 MED ORDER — MAGNESIUM SULFATE 2 GM/50ML IV SOLN
2.0000 g | Freq: Once | INTRAVENOUS | Status: AC
  Administered 2021-09-28: 2 g via INTRAVENOUS
  Filled 2021-09-28: qty 50

## 2021-09-28 MED ORDER — HYDROXYZINE HCL 10 MG PO TABS
10.0000 mg | ORAL_TABLET | Freq: Three times a day (TID) | ORAL | Status: DC | PRN
  Administered 2021-09-28 – 2021-09-29 (×2): 10 mg via ORAL
  Filled 2021-09-28 (×2): qty 1

## 2021-09-28 MED ORDER — LEVALBUTEROL HCL 0.63 MG/3ML IN NEBU
0.6300 mg | INHALATION_SOLUTION | Freq: Four times a day (QID) | RESPIRATORY_TRACT | Status: AC | PRN
  Administered 2021-09-28 – 2021-09-30 (×3): 0.63 mg via RESPIRATORY_TRACT
  Filled 2021-09-28 (×3): qty 3

## 2021-09-28 MED ORDER — PRUTECT EX EMUL
1.0000 "application " | Freq: Two times a day (BID) | CUTANEOUS | Status: DC | PRN
  Filled 2021-09-28: qty 45

## 2021-09-28 MED ORDER — NICOTINE 21 MG/24HR TD PT24
21.0000 mg | MEDICATED_PATCH | Freq: Every day | TRANSDERMAL | Status: DC
  Administered 2021-09-28 – 2021-09-30 (×3): 21 mg via TRANSDERMAL
  Filled 2021-09-28 (×3): qty 1

## 2021-09-28 MED ORDER — ONDANSETRON HCL 4 MG/2ML IJ SOLN
4.0000 mg | Freq: Four times a day (QID) | INTRAMUSCULAR | Status: DC | PRN
  Administered 2021-09-28: 4 mg via INTRAVENOUS
  Filled 2021-09-28: qty 2

## 2021-09-28 MED ORDER — NYSTATIN 100000 UNIT/ML MT SUSP
5.0000 mL | Freq: Four times a day (QID) | OROMUCOSAL | Status: DC
  Administered 2021-09-28 – 2021-09-29 (×5): 500000 [IU] via ORAL
  Filled 2021-09-28 (×5): qty 5

## 2021-09-28 MED ORDER — FUROSEMIDE 10 MG/ML IJ SOLN
40.0000 mg | Freq: Once | INTRAMUSCULAR | Status: AC
  Administered 2021-09-28: 40 mg via INTRAVENOUS
  Filled 2021-09-28: qty 4

## 2021-09-28 NOTE — Progress Notes (Signed)
Pt woke up with complaint of shob at rest. He's currently sitting on the side of his bed he said this helps him breath better. He was on 1.5L  nasal cannula. Increased pt to 3L with current o2 sat ranging from 92-94% and resp rate is 26. No prn neb treatment ordered. Provider paged, waiting for new orders. Will continue to monitor patient.

## 2021-09-28 NOTE — Progress Notes (Signed)
2D echocardiogram completed.  09/28/2021 2:55 PM Kelby Aline., MHA, RVT, RDCS, RDMS

## 2021-09-28 NOTE — TOC Progression Note (Signed)
Transition of Care [facility]) - Progression Note    Patient Details  Name: Jordan Mccullough MRN: [id number] Date of Birth: 16-Mar-1941  Transition of [facility] CM/SW Contact  Purcell Mouton, RN Phone Number: 09/28/2021, 3:06 PM  Clinical Narrative:    TOC will continue to follow for discharge needs.   Expected Discharge Plan: [place] Barriers to Discharge: No Barriers Identified  Expected Discharge Plan and Services Expected Discharge Plan: [place] Choice: [place] arrangements for the past 2 months: Single Family Home                                       Social Determinants of Health (SDOH) Interventions    Readmission Risk Interventions No flowsheet data found.

## 2021-09-28 NOTE — Assessment & Plan Note (Addendum)
Latest magnesium of 1.8

## 2021-09-28 NOTE — Assessment & Plan Note (Addendum)
Nystatin swish and swallow was given during hospitalization.

## 2021-09-28 NOTE — Assessment & Plan Note (Addendum)
Likely multifactorial from pneumonia and congestive heart failure.  Received morphine drip for air hunger.

## 2021-09-28 NOTE — Progress Notes (Signed)
PT Cancellation Note  Patient Details Name: Jordan Mccullough MRN: [id number] DOB: Sep 09, 1940   Cancelled Treatment:    Reason Eval/Treat Not Completed: Medical issues which prohibited therapy--2 attempts to complete PT eval. On 1st attempt, pt was eating lunch. ON 2nd attempt, pt was nauseous/vomiting. Will hold PT today and check back on tomorrow.    [facility]  Office: [phone] Pager: [phone]

## 2021-09-28 NOTE — Progress Notes (Signed)
PROGRESS NOTE    Jordan KALTENBACH  ZOX:[id number] DOB: June 18, 1941 DOA: 09/24/2021 PCP: Tammi Sou, MD    Brief Narrative:  Jordan Mccullough is a 81 y.o. male with past medical history of hypertension, coronary artery disease, seizure disorder, NSCLC (adenocarcinoma) with mets to brain, currently in long term remission since 2019 with recent diagnosis of oropharyngeal cancer risk, still undergoing radiation and chemotherapy presented to hospital with generalized weakness and fever for 2 days with productive cough and decreased appetite.  Patient was febrile at home at 103 F.  In the ED patient met SIRS criteria with leukocytosis tachycardia and fever but lactate was 0.8.  Patient received IV fluid bolus and IV antibiotic in the ED and was admitted hospital for further evaluation and treatment     Assessment and Plan: * Multifocal pneumonia- (present on admission) Concern for aspiration pneumonia due to difficulty with swallowing.  Seen by speech therapy today and recommended dysphagia 2 diet. Continue with antibiotics for now.  Had tachycardia and fever on presentation.  Received 2 L of bolus in the ED.  Continue with Unasyn for now.   Blood cultures negative in less than 2 days.  Urine culture negative so far.  Sputum culture with few gram-positive cocci in pairs and gram-negative rods.  MRSA PCR negative.  Leukocytosis trending down. Temperature max of 100.63F  Dyspnea- (present on admission) Could be likely from multifocal pneumonia but we will also get 2D echocardiogram.  Patient does have peripheral edema.  We will discontinue IV fluids.  Give 1 dose of IV Lasix today.  We will add incentive spirometry and flutter valve.  Wean oxygen as able.  Debility Deconditioning and generalized weakness.  We will get PT evaluation as well.  Hypomagnesemia- (present on admission) We will replace with magnesium supplements.  Check levels in a.m.  Oral thrush- (present on admission) We will continue  nystatin swish and swallow.  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  Patient follows up with Dr. Julien Nordmann as outpatient.    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 seizures, not intractable, without status epilepticus ([place])- (present on admission) Continue lamictal   Essential hypertension- (present on admission) On amlodipine and metoprolol at home.  Currently on hold.  Primary cancer of left upper lobe of lung ([place])- (present on admission) Despite h/o recurrent metastatic dz to brain in 2016, this adenocarcinoma appears to be in remission currently.       DVT prophylaxis: enoxaparin (LOVENOX) injection 40 mg Start: 09/27/21 1000   Code Status:     Code Status: Full Code  Disposition: Home with home health  Status is: Inpatient  Remains inpatient appropriate because: Undergoing radiation treatment, treatment for pneumonia with IV antibiotics, supplemental oxygen   Family Communication: spoke with the patient's spouse on the phone and updated her about the clinical condition of the patient.  Consultants:  Radiation oncology  Procedures:  Radiation treatment  Antimicrobials:  Unasyn IV 09/27/21>  Anti-infectives (From admission, onward)    Start     Dose/Rate Route Frequency Ordered Stop   09/28/21 1330  fluconazole (DIFLUCAN) tablet 100 mg        100 mg Oral Daily 09/28/21 1233 10/19/21 0959   09/27/21 0600  Ampicillin-Sulbactam (UNASYN) 3 g in sodium chloride 0.9 % 100 mL IVPB        3 g 200 mL/hr over 30 Minutes Intravenous Every  6 hours 09/27/21 0120     10/09/2021 2300  vancomycin (VANCOCIN) IVPB 1000 mg/200 mL premix        1,000 mg 200 mL/hr over 60 Minutes Intravenous  Once 09/21/2021 2247 09/27/21 0105   10/07/2021 2300  ceFEPIme (MAXIPIME) 2 g in sodium chloride 0.9 % 100 mL IVPB        2 g 200 mL/hr  over 30 Minutes Intravenous  Once 10/13/2021 2247 09/27/21 0002      Subjective: Today, patient was seen and examined at bedside.  Patient states that he feels a little better with breathing today despite having cough with clear phlegm production.  Currently on 3 L of oxygen saturating around 92%.  Not on oxygen at baseline.  Going for radiation treatment today.  Objective: Vitals:   09/28/21 0216 09/28/21 0318 09/28/21 0839 09/28/21 1017  BP:  119/75    Pulse:  69    Resp: (!) 26 20    Temp:  99 F (37.2 C)  (!) 100.4 F (38 C)  TempSrc:  Oral  Oral  SpO2: 94% 95% 96%   Weight:      Height:        Intake/Output Summary (Last 24 hours) at 09/28/2021 1234 Last data filed at 09/28/2021 0300 Gross per 24 hour  Intake 1221.01 ml  Output 350 ml  Net 871.01 ml   Filed Weights   10/06/2021 2130 09/27/21 1437  Weight: 64 kg 63.8 kg    Physical Examination:.  General:  Average built, not in obvious distress, on 3 L of oxygen by nasal cannula, HENT:   No scleral pallor or icterus noted. Oral mucosa is moist.  Hoarse voice. Chest:  .  Diminished breath sounds bilaterally. No crackles or wheezes.  CVS: S1 &S2 heard. No murmur.  Regular rate and rhythm. Abdomen: Soft, nontender, nondistended.  Bowel sounds are heard.   Extremities: No cyanosis, clubbing but with bilateral peripheral edema.  Peripheral pulses are palpable. Psych: Alert, awake and oriented, normal mood CNS:  No cranial nerve deficits.  Power equal in all extremities.   Skin: Warm and dry.  No rashes noted.  Data Reviewed:   CBC: Recent Labs  Lab 09/28/2021 2210 09/27/21 0323 09/28/21 0410  WBC 19.6* 17.0* 12.2*  NEUTROABS 16.6*  --   --   HGB 11.9* 10.9* 10.7*  HCT 35.0* 32.3* 31.5*  MCV 85.4 87.8 86.8  PLT 184 148* 146*    Basic Metabolic Panel: Recent Labs  Lab 10/12/2021 2210 09/27/21 0323 09/28/21 0410  NA 130* 132* 134*  K 3.7 3.6 3.8  CL 98 100 101  CO2 _0 GLUCOSE 111* 105* 114*  BUN _1 CREATININE 0.89 0.88 0.89  CALCIUM 8.7* 8.2* 8.3*  MG  --   --  1.6*  PHOS  --   --  2.6    Liver Function Tests: Recent Labs  Lab 09/23/2021 2210 09/28/21 0410  AST 11* 16  ALT 13 13  ALKPHOS 59 54  BILITOT 0.5 0.4  PROT 7.1 6.3*  ALBUMIN 3.4* 2.8*     Radiology Studies: DG Chest 2 View  Result Date: 09/29/2021 CLINICAL DATA:  Currently undergoing chemotherapy, presents with fever and shortness of breath. EXAM: CHEST - 2 VIEW COMPARISON:  Chest CT 04/07/2021, with contrast. FINDINGS: Old left upper lobectomy. There are multiple left hilar surgical clips with left-sided volume loss, scarring in the left upper lung field. There is increased masslike fullness in the left  hilar region approaching 4 cm which could be due to technical and positional differences or tumor recurrence. On the right, there is a small pleural effusion with consolidation changes in the peripheral mid and lower lung field involving both the right upper lobe periphery and the lateral basal lower lobe, findings consistent with multilobar pneumonia in this clinical setting. Remaining lungs are clear. Left sulci are sharp. There is osteopenia, degenerative changes of the spine and old left rotator cuff repair changes. An abdominal aortic endovascular repair graft is partially visible. IMPRESSION: 1. Airspace disease in the right upper and lower lobes consistent with multilobar pneumonia. Follow-up films to clearing recommended. 2. Small parapneumonic right pleural effusion. 3. Old left upper lobectomy with increased left hilar soft tissue fullness approaching 4 cm worrisome for tumor recurrence, or could be exaggerated due to positional or technical differences. Follow-up chest CT recommended. 4. Osteopenia and degenerative change. Electronically Signed   By: Telford Nab M.D.   On: 10/08/2021 22:40      LOS: 2 days    Flora Lipps, MD [facility] 09/28/2021, 12:34 PM

## 2021-09-28 NOTE — Assessment & Plan Note (Addendum)
Deconditioning and generalized weakness.  Patient was extremely frail and deconditioned and was initiated on comfort care

## 2021-09-28 NOTE — Plan of Care (Signed)

## 2021-09-29 ENCOUNTER — Ambulatory Visit
Admission: RE | Admit: 2021-09-29 | Discharge: 2021-09-29 | Disposition: A | Discharge status: Home / Self Care | Payer: Medicare Other | Source: Ambulatory Visit | Attending: Radiation Oncology | Admitting: Radiation Oncology

## 2021-09-29 ENCOUNTER — Encounter (HOSPITAL_COMMUNITY): Payer: Self-pay | Admitting: Internal Medicine

## 2021-09-29 ENCOUNTER — Encounter: Payer: Self-pay | Admitting: Internal Medicine

## 2021-09-29 ENCOUNTER — Other Ambulatory Visit: Payer: Self-pay

## 2021-09-29 ENCOUNTER — Inpatient Hospital Stay (HOSPITAL_COMMUNITY): Payer: Medicare Other

## 2021-09-29 DIAGNOSIS — I5189 Other ill-defined heart diseases: Secondary | ICD-10-CM

## 2021-09-29 DIAGNOSIS — I5021 Acute systolic (congestive) heart failure: Secondary | ICD-10-CM | POA: Diagnosis not present

## 2021-09-29 DIAGNOSIS — R5381 Other malaise: Secondary | ICD-10-CM | POA: Diagnosis not present

## 2021-09-29 DIAGNOSIS — J189 Pneumonia, unspecified organism: Secondary | ICD-10-CM | POA: Diagnosis not present

## 2021-09-29 DIAGNOSIS — J9601 Acute respiratory failure with hypoxia: Secondary | ICD-10-CM

## 2021-09-29 DIAGNOSIS — I5043 Acute on chronic combined systolic (congestive) and diastolic (congestive) heart failure: Secondary | ICD-10-CM

## 2021-09-29 DIAGNOSIS — Z7189 Other specified counseling: Secondary | ICD-10-CM

## 2021-09-29 DIAGNOSIS — E440 Moderate protein-calorie malnutrition: Secondary | ICD-10-CM

## 2021-09-29 LAB — CULTURE, RESPIRATORY W GRAM STAIN: Culture: NORMAL

## 2021-09-29 LAB — BASIC METABOLIC PANEL
Anion gap: 7 (ref 5–15)
BUN: 15 mg/dL (ref 8–23)
CO2: 28 mmol/L (ref 22–32)
Calcium: 8.1 mg/dL — ABNORMAL LOW (ref 8.9–10.3)
Chloride: 99 mmol/L (ref 98–111)
Creatinine, Ser: 0.92 mg/dL (ref 0.61–1.24)
GFR, Estimated: >60 mL/min (ref >60)
Glucose, Bld: 122 mg/dL — ABNORMAL HIGH (ref 70–99)
Potassium: 3.2 mmol/L — ABNORMAL LOW (ref 3.5–5.1)
Sodium: 134 mmol/L — ABNORMAL LOW (ref 135–145)

## 2021-09-29 LAB — CBC
HCT: 30.4 % — ABNORMAL LOW (ref 39.0–52.0)
Hemoglobin: 10.3 g/dL — ABNORMAL LOW (ref 13.0–17.0)
MCH: 28.9 pg (ref 26.0–34.0)
MCHC: 33.9 g/dL (ref 30.0–36.0)
MCV: 85.4 fL (ref 80.0–100.0)
Platelets: 134 10*3/uL — ABNORMAL LOW (ref 150–400)
RBC: 3.56 MIL/uL — ABNORMAL LOW (ref 4.22–5.81)
RDW: 15.5 % (ref 11.5–15.5)
WBC: 16.6 10*3/uL — ABNORMAL HIGH (ref 4.0–10.5)
nRBC: 0 % (ref 0.0–0.2)

## 2021-09-29 LAB — ECHOCARDIOGRAM COMPLETE
AR max vel: 0.9 cm2
AV Area VTI: 0.89 cm2
AV Area mean vel: 1.04 cm2
AV Mean grad: 11 mmHg
AV Peak grad: 22.7 mmHg
Ao pk vel: 2.38 m/s
Height: 64 in
S' Lateral: 3.4 cm
Weight: 2250.46 oz

## 2021-09-29 LAB — BRAIN NATRIURETIC PEPTIDE: B Natriuretic Peptide: 660.7 pg/mL — ABNORMAL HIGH (ref 0.0–100.0)

## 2021-09-29 LAB — MAGNESIUM: Magnesium: 2.1 mg/dL (ref 1.7–2.4)

## 2021-09-29 MED ORDER — SODIUM CHLORIDE 0.9 % IV SOLN
500.0000 mg | INTRAVENOUS | Status: DC
  Administered 2021-09-29 – 2021-09-30 (×2): 500 mg via INTRAVENOUS
  Filled 2021-09-29 (×2): qty 5

## 2021-09-29 MED ORDER — FOOD THICKENER (SIMPLYTHICK): 1.0000 | ORAL | Status: DC | PRN

## 2021-09-29 MED ORDER — FUROSEMIDE 10 MG/ML IJ SOLN: 40.0000 mg | Freq: Every day | INTRAMUSCULAR | Status: DC

## 2021-09-29 MED ORDER — FUROSEMIDE 10 MG/ML IJ SOLN
40.0000 mg | Freq: Two times a day (BID) | INTRAMUSCULAR | Status: DC
  Administered 2021-09-29 – 2021-10-01 (×4): 40 mg via INTRAVENOUS
  Filled 2021-09-29 (×4): qty 4

## 2021-09-29 MED ORDER — ENSURE ENLIVE PO LIQD
237.0000 mL | Freq: Four times a day (QID) | ORAL | Status: DC
  Administered 2021-09-29: 237 mL via ORAL

## 2021-09-29 MED ORDER — FUROSEMIDE 10 MG/ML IJ SOLN
INTRAMUSCULAR | Status: AC
  Administered 2021-09-29: 40 mg
  Filled 2021-09-29: qty 4

## 2021-09-29 MED ORDER — METOPROLOL TARTRATE 50 MG PO TABS
50.0000 mg | ORAL_TABLET | Freq: Two times a day (BID) | ORAL | Status: DC
  Administered 2021-09-29 (×2): 50 mg via ORAL
  Filled 2021-09-29 (×2): qty 1

## 2021-09-29 MED ORDER — POTASSIUM CHLORIDE 20 MEQ PO PACK
40.0000 meq | PACK | Freq: Two times a day (BID) | ORAL | Status: AC
  Administered 2021-09-29 (×2): 40 meq via ORAL
  Filled 2021-09-29 (×2): qty 2

## 2021-09-29 NOTE — Assessment & Plan Note (Addendum)
Seen by dietitian during hospitalization

## 2021-09-29 NOTE — Progress Notes (Signed)
Initial Nutrition Assessment  DOCUMENTATION CODES:   Non-severe (moderate) malnutrition in context of chronic illness  INTERVENTION:  - communicated with SLP and MD via secure chat. - will increase Ensure Enlive from BID to QID, each supplement provides 350 kcal and 20 grams of protein.   NUTRITION DIAGNOSIS:   Moderate Malnutrition related to chronic illness, cancer and cancer related treatments as evidenced by moderate fat depletion, mild muscle depletion, severe muscle depletion.  GOAL:   Patient will meet greater than or equal to 90% of their needs  MONITOR:   PO intake, Supplement acceptance, Labs, Weight trends  REASON FOR ASSESSMENT:   Diagnosis, Other (Comment) (message received from RD at the [facility])  ASSESSMENT:   81 y.o. male with medical history of HTN, CAD, seizure disorder, NSCLC (adenocarcinoma) with mets to brain currently in long term remission since ~2019, recent new diagnosis of oropharyngeal cancer (squamous cell), currently undergoing XRT and chemo (cisplatin). He presented to the ED due to generalized weakness and fevers x2 days, productive cough, decreased appetite, and an episode of vomiting 3 days prior to presentation. Admitted with dx of multifocal PNA.  Diet advanced from NPO to Dysphagia 2, thin liquids on 2/7 at 1030. No documented meal intakes since that time.  Patient re-assessed by SLP earlier this afternoon with plan for re-assessment (MBS) on 2/10.  Patient had XRT earlier today. At the time of RD visit he was laying in bed with NRB in place and had increased WOB, use of accessory abdominal muscles with breathing.  His wife, daughter, and a male family member were at bedside. Patient directed RD to talk with his daughter primarily. Patient denied abdominal or oral or esophageal pain or nausea at the time of RD visit.   Patient had been seen by RD at the [facility] on 09/12/21 and 09/19/21 at which time he reported that taste  alterations were beginning to occur. He reported dry mouth, especially at night, and some nausea. He was encouraged to drink Ensure 1-2 times/day between meals.   His daughter shares that today he drank an Ensure and had a cup each of pudding and jello. Provided patient with another bottle of Ensure at the end of visit.    Weight on 2/7 was 141 lb and weight appears overall stable since 03/14/21.   Labs reviewed; Na: 134 mmol/l, K: 3.2 mmol/l, Ca: 8.1 mg/dl.  Medications reviewed; 40 mg IV lasix BID, 5 ml mycostatin QID, 40 mg oral protonix/day, 40 mEq Klor-Con BID.    NUTRITION - FOCUSED PHYSICAL EXAM:  Flowsheet Row Most Recent Value  Orbital Region Moderate depletion  Upper Arm Region Severe depletion  Thoracic and Lumbar Region Mild depletion  Buccal Region Moderate depletion  Temple Region Mild depletion  Clavicle Bone Region Severe depletion  Clavicle and Acromion Bone Region Severe depletion  Scapular Bone Region Unable to assess  Dorsal Hand Mild depletion  Patellar Region Mild depletion  Anterior Thigh Region Mild depletion  Posterior Calf Region Mild depletion  Edema (RD Assessment) Mild  [BLE]  Hair Reviewed  Eyes Reviewed  Mouth Unable to assess  [need to maintain NRB placement d/t severity of shortness of breath]  Skin Reviewed  Nails Reviewed       Diet Order:   Diet Order             DIET DYS 2 Room service appropriate? Yes; Fluid consistency: Thin  Diet effective now  EDUCATION NEEDS:   Education needs have been addressed  Skin:  Skin Assessment: Reviewed RN Assessment  Last BM:  PTA/unknown  Height:   Ht Readings from Last 1 Encounters:  10/08/2021 5\' 4"  (1.626 m)    Weight:   Wt Readings from Last 1 Encounters:  09/27/21 63.8 kg     BMI:  Body mass index is 24.14 kg/m.   Estimated Nutritional Needs:  Kcal:  2100-2300 kcal Protein:  105-120 grams Fluid:  >/= 2.3 L/day     Jarome Matin, MS, RD,  LDN Inpatient Clinical Dietitian RD pager # available in [place]  After hours/weekend pager # available in [facility], [place]

## 2021-09-29 NOTE — Assessment & Plan Note (Addendum)
2D echocardiogram showed reduced function ejection fraction of 40 to 45%.  Received IV diuretics during hospitalization.  Cardiology also saw the patient.Marland Kitchen

## 2021-09-29 NOTE — Progress Notes (Signed)
Speech Language Pathology Treatment: Dysphagia  Patient Details Name: Jordan Mccullough MRN: [id number] DOB: March 14, 1941 Today's Date: 09/29/2021 Time: 1250-1305 SLP Time Calculation (min) (ACUTE ONLY): 15 min  Assessment / Plan / Recommendation Clinical Impression  Per RN communication, pt desires to have re-evaluation of swallowing.  SLP reviewed flouro loops from prior study approx 15 days ago.  Pt admits to issues with coughing on thin liquids - water - more than slightly thicker items such as Ensure.  Suspect exacerbation of mild baseline dysphagia due to radiation effects.  .  Given pt and family confirms pt coughing mostly with thin liquids, will order thickener for pt to use in drinks if he finds this helpful.  SLP observed him consuming thin liquids - no indication of aspiration.  He endorses significant coughing episode prior to hospital admit - stating it may occur in the middle of the night and he atrributes it to smoking.   In addition, he has required Tums on two occasions for breakthrough reflux, epigastric pain - thus concern for potential esophageal dysphagia *h/o XRT for lung cancer* and reflux contributing to dysphagia/aspiration risk.  Advised pt consider sleeping on a wedge to help with reflux and current respiratory issues. Note pt being treated for oral candidiasis that may also contribute to odynophagia. Note discussion re: PEG ongoing.  Suspect decreased intake is multfactorial - due to dysphagia - "choking" on liquids and odynophagia from XRT and ? candidiasis. Pt dislikes Lidocaine due to it impairing his sensation - causing him concern for swallow ability - which SLP understands.  MBS order placed again for pt.  On schedule for am of 09/30/2021 - daughter and pt's wife agreeable to plan.    HPI HPI: Patient is a 81 y.o. male with PMH: HTN, CAD, seizure disorder, NSCLC(adenocarcinoma) with mets to brain, currently on long term remission since 2019, recent diagnosis of oropharyngeal  cancer (squamous cell) currently undergoing radiation and chemo. He presented to ED due to generalized weakness and fevers for past 2 days, productive cough (patient says is baseline), decreased appetite and vomiting three days prior to admission but no N/V currently. Temperature taken at home was 103 degrees F. In ED, patient with tachycardia, CXR revealed Airspace disease in the right upper and lower lobes consistent with multilobar pneumonia. Patient also c/o soreness in throat. He had been seen by OP SLP (most recent 1/26) and had MBS on 1/25 which showed min oral and mild pharyngeal phase dysphagia.  Pt seen by SLP on Tuesday 2/7 for swallow evaluation and at that time he declined repeat MBS.      SLP Plan  MBS      Recommendations for follow up therapy are one component of a multi-disciplinary discharge planning process, led by the attending physician.  Recommendations may be updated based on patient status, additional functional criteria and insurance authorization.    Recommendations  Diet recommendations: Dysphagia 2 (fine chop);Thin liquid;Nectar-thick liquid Liquids provided via: Cup;Straw Medication Administration: Whole meds with puree Supervision: Patient able to self feed Compensations: Slow rate;Small sips/bites;Follow solids with liquid;Clear throat intermittently Postural Changes and/or Swallow Maneuvers: Seated upright 90 degrees                Oral Care Recommendations: Oral care BID;Patient independent with oral care Assistance recommended at discharge: PRN SLP Visit Diagnosis: Dysphagia, pharyngoesophageal phase (R13.14);Dysphagia, pharyngeal phase (R13.13) Plan: MBS           Makinzie Considine, Lise Auer, MS [facility] SLP Acute Rehab Services  Office [phone] Cell [phone]    09/29/2021, 3:44 PM

## 2021-09-29 NOTE — Progress Notes (Deleted)
Pt had coughing spell with difficulty recovering. Pt tachypnic, increased WOB, tachycardic with wheezing and rhonchi. Pt desat to 80's. Increased O2 from 3L [place] to 8L HFNC. Called Resp to bedside for PRN neb treatment. Pt now in need of HFNC and nonrebreather. SpO2 now 94%, but HR in 150's. Provider notified and came to bedside. New orders for Lasix, pt reports some relief. HR still elevated. New orders for BIPAP. Pt report WOB improved and HR decreased after BIPAP.

## 2021-09-29 NOTE — Plan of Care (Signed)

## 2021-09-29 NOTE — Progress Notes (Signed)
°   09/29/21 0512  Vitals  Temp 99.7 F (37.6 C)  Temp Source Oral  BP (!) 123/94  MAP (mmHg) 105  BP Method Automatic  Pulse Rate (!) 140  Pulse Rate Source Monitor  Resp (!) 30  MEWS COLOR  MEWS Score Color Red  Oxygen Therapy  SpO2 93 %  O2 Device HFNC  MEWS Score  MEWS Temp 0  MEWS Systolic 0  MEWS Pulse 3  MEWS RR 2  MEWS LOC 0  MEWS Score 5    Pt had coughing spell with difficulty recovering. Pt tachypnic, increased WOB, tachycardic with wheezing and rhonchi. Pt desat to 80's. Increased O2 from 3L [place] to 8L HFNC. Called Resp to bedside for PRN neb treatment. Pt now in need of HFNC and nonrebreather. SpO2 now 94%, but HR in 150's. Provider notified and came to bedside. New orders for Lasix, pt reports some relief. HR still elevated. New orders for BIPAP. Pt report WOB improved and HR decreased after BIPAP.         Note Details

## 2021-09-29 NOTE — Progress Notes (Signed)
PT Cancellation Note  Patient Details Name: AADAN CHENIER MRN: [id number] DOB: 25-Jan-1941   Cancelled Treatment:    Reason Eval/Treat Not Completed: Medical issues which prohibited therapy,has been requiring more Oxygen earlier today. Will check back tomorrow.    Claretha Cooper 09/29/2021, 5:02 PM [place] Pager [phone] Office [phone]

## 2021-09-29 NOTE — Care Management Important Message (Signed)
Important Message  Patient Details IM Letter placed in Patients room. Name: Jordan Mccullough MRN: [id number] Date of Birth: [birth year]   Medicare Important Message Given:  Yes     Kerin Salen 09/29/2021, 11:35 AM

## 2021-09-29 NOTE — Consult Note (Addendum)
Cardiology Consultation:   Patient ID: Jordan Mccullough MRN: [id number]; DOB: [birth year]  Admit date: 10/16/2021 Date of Consult: 09/29/2021  PCP:  Tammi Sou, MD   [place] Providers Cardiologist:  None   {  Patient Profile:   Jordan Mccullough is a 81 y.o. male with a history of CAD with remote MI in 1992 s/p PTCA, AAA s/p repair in 04/2014 with persistent endograft leak, hypertension, hyperlipidemia, seizure disorder, stage IV non-small cell lung cancer with metastasis to the brain in long-term remission, and diagnosis of squamous cell oropharyngeal cancer currently undergoing radiation and chemo who is being seen for evaluation of CHF at the request of Dr. Louanne Belton.  History of Present Illness:   [name] is a 81 year old male with the above history.  Patient has a history of CAD with remote MI in 1992 which was treated with PTCA.  Stress Myoview in 2012 in the setting of recurrent chest pain and shortness of breath showed no ischemia.  Exercise stress test in 06/2012 was also negative.  Patient was last seen by Dr. Angelena Form in 2013.  It does not look like he has been seen by Cardiology since that time.  He also has a history of AAA and underwent repair of this in 04/2014.  He has a known persistent type II endoleak of the infrarenal stent graft but this was stable on most recent CTA in 08/2020.  Last echo in 07/2015 showed LVEF of 50-55% with hypokinesis of inferior myocardium and grade 1 diastolic dysfunction.  Patient presented to the [facility] long ED on 10/10/2021 for further evaluation of 2 days of generalized weakness and fevers and was found to have multilobar pneumonia concern for aspiration pneumonia in the setting of swallowing difficulties secondary to oropharyngeal cancer.  He was admitted for 6 sepsis secondary to pneumonia and was treated with IV fluids and antibiotics.  Echo this admission showed LVEF of 40-45% but wall motion could not be fully assessed.  RV normal with  moderately elevated PASP.  Cardiology was consulted for further evaluation.  At the time of this evaluation, patient just came back from radiation. Family is present at bedside and assists with history.  Patient started having significant weakness and fevers on Saturday 09/24/2021 but denies any shortness of breath. However, since being here in the hospital he has had several episodes of tachypnic and significant shortness of breath. He required BiPAP overnight and is now on 8L of O2 via nasal cannula. No orthopnea or PND. No significant lower extremity edema. No chest pain. He is tachycardic now and reports palpitations with this but no syncope. He has a chronic productive cough due to his significant smoking history. He does report some hemoptysis today which he does not usually have. No other abnormal bleeding in urine or stools. He has some nausea which family attributes to his chemotherapy but no other GI symptoms. Patient is mostly sedentary at home and is rather unsteady on his feet. He has had a couple of mechanicals falls at home due to weakness and tripping over things.  Past Medical History:  Diagnosis Date   AAA (abdominal aortic aneurysm)    s/p repair (aortoiliac bipass; with persistent endograft leak in inferior mesenteric artery)--stable as of 01/2017 vascular f/u.   Acute hypoxemic respiratory failure ([place]) 09/2021   Admitted with multifocal pneumonia   Asymptomatic cholelithiasis 07/2015   Incidental finding on PET CT   Back pain 04/19/2016   BPH with elevated PSA    PSA  signif rise 11/2017 (5.4 in 2015 to 31.21 November 2017. Bx 03/2018 benign.   Coronary artery disease    MI 1992, S/P  PTCA; negative stress test in November 2011 with no ischemia.    COVID-19 virus infection 12/07/2020   Diverticulosis    Elevated PSA 01/2018   01/22/18:  PSA 26.5.  Prostate MRI with abnormality->subsequent prostate bx BENIGN 04/12/18.PSA 72019 stbl at 24.1->rose to 36 03/2020->rec'd return to urol.    Gross hematuria 05/2018   Occurred 2+ mo's after prostate bx: attributed to bx by urol, treated empirically with keflex.  Urol initially suspected gross hem was sequela of recent prost bx. BUT recurrence of gross hem prompted full hematuria w/u 11/15/18.   Hearing loss    Bilateral    Hepatic steatosis    History of radiation therapy 11/10/13-12/12/13   lung,50Gy/51fx   Hyperlipidemia    Crestor= myalgias   Hypertension    Laryngeal mass 07/2021   with L cervical lymphadenopathy.  ENT->bx 07/29/21->invasive SCC   Lung cancer ([place]) 05/2013   non-small cell;  L upper lobectomy with mediastinal LN dissection, chemo, and radiation in 2014/2015. Remission until pt had questionable seizure 07/2015--MRI showed brain mets; palliative brain rad (stereotactic radiation therapy) started 08/25/15.  CT C/A/P clear 02/2016, 05/2016, 11/2016, 12/2018. MRI brain 12/2018 stable lesions/no new mets. Rpt 6 mo per onc.   Malignant neoplasm of upper lobe, left bronchus or lung ([place]) 10/29/2013   *?New spiculated 4.5cm mass in L upper lung field on CXR at [facility] in [place] 07/14/17.  Dr. Julien Nordmann (onc) recommended f/u CXR after abx course.  If mass unchanged then repeat CT chest.  Surveillance CT chest and MRI brain 06/2018 show no sign of dz.   Myocardial infarction [facility]) 1992   Dr Angelena Form     Obstructive uropathy    obstructive and reflux uropathy   Peripheral vascular disease ([place]) 08/2012   4.8x4.6 cm infrarenal abdominal aortic fusiform aneurysm, 1.5 cm right common iliac artery aneurysm.  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   Pneumonia 09/06/2018   XRAY was done at [facility].   S/P radiation therapy [facility] 08/25/15   Stereotactic radiation therapy: frontoparietal 18gy,posterior frontal lobe 20gy,   Seizure disorder ([place]) 2016/2017   as sequela of brain mets;  Grand mal seizure 07/2015,  then got on keppra and was seizure-free until focal motor seizures of L side of face began 02/2016- these responded well to up-titration of keppra but pt had adverse side effects so eventually pt had to be switched over to lamictal 07/2016--stable/seizure free on this med as of 09/2018 neuro f/u.   Tibial plateau fracture, right 03/2021   Tobacco dependence    "Quit" 1992, but pt has smoked "on and off" since that time   Unilateral vocal cord paralysis    left, onset s/p mediastinoscopy with mediastinal LN resection 2014.    Past Surgical History:  Procedure Laterality Date   ABDOMINAL AORTIC ENDOVASCULAR STENT GRAFT N/A 05/07/2014   Procedure: ABDOMINAL AORTIC ENDOVASCULAR STENT GRAFT;  Surgeon: Serafina Mitchell, MD;  Location: [facility];  Service: Vascular;  Laterality: N/A;   Carotid duplex dopplers  07/2015   1-39% on R, no signif dz noted on L   COLONOSCOPY W/ POLYPECTOMY  2003    negative 2010,due 2020; Dr Olevia Perches   CORONARY ANGIOPLASTY     no stents   CYSTOSCOPY/RETROGRADE/URETEROSCOPY  Bilateral 10/09/2012   Procedure: BILATERAL RETROGRADE bladder and urethral BIOPSY ;  Surgeon: Molli Hazard, MD;  Location: [facility];  Service: Urology;  Laterality: Bilateral;  BILATERAL RETROGRADE    EEG  08/05/15   Pt placed on keppra just prior to this test due to having ? seizure (MRI showed brain mets)   HERNIA REPAIR Bilateral    Inguinal   INGUINAL HERIIORRHAPHY BILATERALLY     IR ANGIOGRAM SELECTIVE EACH ADDITIONAL VESSEL  12/03/2017   IR ANGIOGRAM VISCERAL SELECTIVE  12/03/2017   IR ANGIOGRAM VISCERAL SELECTIVE  12/03/2017   IR EMBO ARTERIAL NOT HEMORR HEMANG INC GUIDE ROADMAPPING  12/03/2017   IR GENERIC HISTORICAL  07/20/2016   IR RADIOLOGIST EVAL & MGMT 07/20/2016 [facility] INTERV RAD   IR GENERIC HISTORICAL  08/02/2016   IR RADIOLOGIST EVAL & MGMT 08/02/2016 Sandi Mariscal, MD [facility] INTERV RAD   IR RADIOLOGIST EVAL & MGMT  11/08/2017   IR RADIOLOGIST EVAL & MGMT  01/01/2018   IR RADIOLOGIST  EVAL & MGMT  08/27/2019   IR RADIOLOGIST EVAL & MGMT  09/16/2020   IR US GUIDE VASC ACCESS RIGHT  12/03/2017   LARYNGOSCOPY AND ESOPHAGOSCOPY N/A 08/09/2021   Procedure: MICRO DIRECT LARYNGOSCOPY WITH BIOPSY AND ESOPHAGOSCOPY;  Surgeon: Melida Quitter, MD;  Location: [facility];  Service: ENT;  Laterality: N/A;   MEDIASTINOSCOPY N/A 05/01/2013   Procedure: MEDIASTINOSCOPY;  Surgeon: Gaye Pollack, MD;  Location: [facility];  Service: Thoracic;  Laterality: N/A;   PFTs  04/2013   Minimal obstructive airway disease   PILONIDAL CYST EXCISION     PROSTATE BIOPSY N/A 10/09/2012   NEG bx 04/12/18 as well.  Procedure: PROSTATIC URETHRAL BIOPSY--BPH--no evidence of malignancy;  Surgeon: Molli Hazard, MD;  Location: [facility];  Service: Urology;  Laterality: N/A;  PROSTATIC URETHRAL BIOPSY   PROSTATE BIOPSY  03/2018   NEG   PTCA  1992   ROTATOR CUFF REPAIR     Bilateral   THOROCOTOMY WITH LOBECTOMY Left 05/29/2013   Procedure: LEFT THOROCOTOMY WITH LEFT UPPER LOBE LOBECTOMY;  Surgeon: Gaye Pollack, MD;  Location[facility];  Service: Thoracic;  Laterality: Left;   TRANSTHORACIC ECHOCARDIOGRAM  07/2015   EF 50-55%, hypokin of inf myoc, grd I DD, mild MR, mod dilat of LA.   VIDEO BRONCHOSCOPY N/A 05/01/2013   Procedure: VIDEO BRONCHOSCOPY;  Surgeon: Gaye Pollack, MD;  Location: [facility];  Service: Thoracic;  Laterality: N/A;    Inpatient Medications: Scheduled Meds:  aspirin  81 mg Oral Daily   enoxaparin (LOVENOX) injection  40 mg Subcutaneous Q24H   feeding supplement  237 mL Oral BID BM   fluconazole  100 mg Oral Daily   [START ON 09/30/2021] furosemide  40 mg Intravenous Daily   lamoTRIgine  200 mg Oral Daily   And   lamoTRIgine  300 mg Oral QHS   levalbuterol  0.63 mg Nebulization TID   lidocaine  15 mL Mouth/Throat QID   mometasone-formoterol  2 puff Inhalation BID   nicotine  21 mg Transdermal Daily   nystatin  5 mL Oral QID   pantoprazole  40 mg Oral Daily   potassium chloride  40 mEq Oral BID    tamsulosin  0.4 mg Oral Daily   Continuous Infusions:  ampicillin-sulbactam (UNASYN) IV 3 g (09/29/21 1149)   azithromycin     PRN Meds: acetaminophen, hydrOXYzine, levalbuterol, ondansetron (ZOFRAN) IV, PruTect  Allergies:    Allergies  Allergen Reactions   Quinolones Other (  See Comments)    Pt with aneurism: need to avoid quinoline use b/c of weakening of vascular wall that can be associated with quinolones.   Wellbutrin [Bupropion] Other (See Comments)    AVOID IN PT W/HX OF SEIZURES   Rosuvastatin Other (See Comments)    Myalgias and dark urine   Iodinated Contrast Media Hives    1 hive on lt cheek lasting approximately 1 hour on last 2 CT per pt; needs pre meds in future; 50 mg benadryl po 1 hr prior to exam per Dr. Weber Cooks 07/2014  11/2016 per Tery Sanfilippo, pt needs 13hr premeds per our policy.    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  Tobacco Use   Smoking status: Every Day    Packs/day: 1.00    Years: 24.00    Pack years: 24.00    Types: Cigarettes    Last attempt to quit: 05/28/2013    Years since quitting: 8.3   Smokeless tobacco: Never   Tobacco comments:    QUIT 15 YEARS AGO-07/03/17 smoking  Vaping Use   Vaping Use: Never used  Substance and Sexual Activity   Alcohol use: Yes    Alcohol/week: 15.0 standard drinks    Types: 3 Glasses of wine, 12 Cans of beer per week    Comment: 1-2 beers a day   Drug use: No   Sexual activity: Not on file  Other Topics Concern   Not on file  Social History Narrative   HEART HEALTHY DIET.     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   MARRIED, 2 children who live in [place].  Smoked on and off since then.   ALCOHOL USE -YES- RED WINE SOCIALLY, couple of beers at night  usually.   07-08-18 Unable to ask abuse questions wife with him today.   Right handed    Social Determinants of Health   Financial Resource Strain: Low Risk    Difficulty of Paying Living Expenses: Not hard at all  Food Insecurity: No Food Insecurity   Worried About Charity fundraiser in the Last Year: Never true   Ran Out of Food in the Last Year: Never true  Transportation Needs: No Transportation Needs   Lack of Transportation (Medical): No   Lack of Transportation (Non-Medical): No  Physical Activity: Insufficiently Active   Days of Exercise per Week: 4 days   Minutes of Exercise per Session: 30 min  Stress: No Stress Concern Present   Feeling of Stress : Not at all  Social Connections: Moderately Isolated   Frequency of Communication with Friends and Family: More than three times a week   Frequency of Social Gatherings with Friends and Family: More than three times a week   Attends Religious Services: Never   Marine scientist or Organizations: No   Attends Archivist Meetings: Never   Marital Status: Married  Human resources officer Violence: Not on file    Family History:   Family History  Problem Relation Age of Onset   Hypertension Mother    Prostate cancer Father        Prostate cancer   Cancer Father        Brain Tumor  Lung cancer Sister        NON SMOKER   Cancer Sister    Hyperlipidemia Sister    Hyperlipidemia Brother    Heart attack Brother    Hypertension Brother    Prostate cancer Brother    Diabetes Neg Hx    Stroke Neg Hx      ROS:  Please see the history of present illness.  Review of Systems  Constitutional:  Positive for fever and malaise/fatigue.  HENT:  Negative for congestion.   Respiratory:  Positive for cough, sputum production and shortness of breath.   Cardiovascular:  Positive for palpitations. Negative for chest pain, orthopnea, leg swelling and PND.  Gastrointestinal:  Positive for nausea. Negative for abdominal pain,  blood in stool, melena and vomiting.  Genitourinary:  Negative for hematuria.  Musculoskeletal:  Positive for falls.  Neurological:  Negative for loss of consciousness.  Endo/Heme/Allergies:  Does not bruise/bleed easily.  Psychiatric/Behavioral:  Positive for substance abuse (long smoking history).    Physical Exam/Data:   Vitals:   09/29/21 0603 09/29/21 0614 09/29/21 0850 09/29/21 0927  BP:    119/85  Pulse: 65   (!) 55  Resp: (!) 26 (!) 30  20  Temp:    98.9 F (37.2 C)  TempSrc:    Oral  SpO2: 94%  95% 90%  Weight:      Height:        Intake/Output Summary (Last 24 hours) at 09/29/2021 1302 Last data filed at 09/29/2021 5176 Gross per 24 hour  Intake 640 ml  Output 600 ml  Net 40 ml   Last 3 Weights 09/27/2021 09/22/2021 09/19/2021  Weight (lbs) 140 lb 10.5 oz 141 lb 142 lb 2 oz  Weight (kg) 63.8 kg 63.957 kg 64.467 kg     Body mass index is 24.14 kg/m.  General: 81 y.o. Caucasian male on 8L of O2 via nasal cannula. HEENT: Normocephalic and atraumatic. Sclera clear.  Neck: Supple. No JVD. Heart: Tachycardic with irregular rhythm. Difficult to appreciated heart sounds over lung sounds. Radial pulses 2+ and equal bilaterally. Lungs: Increased work of breathing. Course breath sounds with rhonchi throughout. Abdomen: Soft, non-distended, and non-tender to palpation.  Extremities: No lower extremity edema.    Skin: Warm and dry. Neuro: Alert and oriented x3. No focal deficits. Psych: Normal affect. Responds appropriately.  EKG:  The EKG was personally reviewed and demonstrates: Sinus tachycardia, rate 109 bpm, with PVC and isolated T wave inversions in aVL (but this has been seen on prior tracings). Left axis deviation. Normal PR and QRS intervals. QTC 443 ms.  Telemetry:  Telemetry was personally reviewed and demonstrates:  Narrow complex irregular rhyhtm with rates in the 100s to 120s. Multiple P waves noted so I think this is sinus tachycardia with frequent PACs vs  MAT.  Relevant CV Studies:  Echocardiogram 09/29/2021: Impressions:  1. Cannot fully assess wall motion. . Left ventricular ejection fraction, by estimation, is 40 to 45%. The left ventricle has mildly decreased function.   2. Right ventricular systolic function is normal. The right ventricular size is normal. There is moderately elevated pulmonary artery systolic pressure.   3. The mitral valve is grossly normal. No evidence of mitral valve regurgitation.   4. The aortic valve is grossly normal. Aortic valve regurgitation is not visualized.   5. The inferior vena cava is dilated in size with >50% respiratory variability, suggesting right atrial pressure of 8 mmHg.   Laboratory Data:  High Sensitivity Troponin:  No results for input(s): TROPONINIHS in the last 720 hours.   Chemistry Recent Labs  Lab 09/27/21 0323 09/28/21 0410 09/29/21 0348  NA 132* 134* 134*  K 3.6 3.8 3.2*  CL 100 101 99  CO2 24 26 28   GLUCOSE 105* 114* 122*  BUN 17 16 15   CREATININE 0.88 0.89 0.92  CALCIUM 8.2* 8.3* 8.1*  MG  --  1.6* 2.1  GFRNONAA >60 >60 >60  ANIONGAP 8 7 7     Recent Labs  Lab 10/17/2021 2210 09/28/21 0410  PROT 7.1 6.3*  ALBUMIN 3.4* 2.8*  AST 11* 16  ALT 13 13  ALKPHOS 59 54  BILITOT 0.5 0.4   Lipids No results for input(s): CHOL, TRIG, HDL, LABVLDL, LDLCALC, CHOLHDL in the last 168 hours.  Hematology Recent Labs  Lab 09/27/21 0323 09/28/21 0410 09/29/21 0348  WBC 17.0* 12.2* 16.6*  RBC 3.68* 3.63* 3.56*  HGB 10.9* 10.7* 10.3*  HCT 32.3* 31.5* 30.4*  MCV 87.8 86.8 85.4  MCH 29.6 29.5 28.9  MCHC 33.7 34.0 33.9  RDW 15.2 15.4 15.5  PLT 148* 146* 134*   Thyroid No results for input(s): TSH, FREET4 in the last 168 hours.  BNPNo results for input(s): BNP, PROBNP in the last 168 hours.  DDimer No results for input(s): DDIMER in the last 168 hours.   Radiology/Studies:  DG Chest 2 View  Result Date: 10/06/2021 CLINICAL DATA:  Currently undergoing chemotherapy, presents  with fever and shortness of breath. EXAM: CHEST - 2 VIEW COMPARISON:  Chest CT 04/07/2021, with contrast. FINDINGS: Old left upper lobectomy. There are multiple left hilar surgical clips with left-sided volume loss, scarring in the left upper lung field. There is increased masslike fullness in the left hilar region approaching 4 cm which could be due to technical and positional differences or tumor recurrence. On the right, there is a small pleural effusion with consolidation changes in the peripheral mid and lower lung field involving both the right upper lobe periphery and the lateral basal lower lobe, findings consistent with multilobar pneumonia in this clinical setting. Remaining lungs are clear. Left sulci are sharp. There is osteopenia, degenerative changes of the spine and old left rotator cuff repair changes. An abdominal aortic endovascular repair graft is partially visible. IMPRESSION: 1. Airspace disease in the right upper and lower lobes consistent with multilobar pneumonia. Follow-up films to clearing recommended. 2. Small parapneumonic right pleural effusion. 3. Old left upper lobectomy with increased left hilar soft tissue fullness approaching 4 cm worrisome for tumor recurrence, or could be exaggerated due to positional or technical differences. Follow-up chest CT recommended. 4. Osteopenia and degenerative change. Electronically Signed   By: Telford Nab M.D.   On: 09/29/2021 22:40   ECHOCARDIOGRAM COMPLETE  Result Date: 09/28/2021    ECHOCARDIOGRAM REPORT   Patient Name:   BRALYNN DONADO Date of Exam: 09/28/2021 Medical Rec #:  [id number]      Height:       64.0 in Accession #:    [id number]     Weight:       140.7 lb Date of Birth:  1941-04-01       BSA:          1.685 m Patient Age:    58 years       BP:           128/84 mmHg Patient Gender: M              HR:  120 bpm. Exam Location:  Inpatient Procedure: 2D Echo, Cardiac Doppler and Color Doppler Indications:    Dyspnea  History:         Patient has prior history of Echocardiogram examinations, most                 recent 08/05/2015. CAD and Previous Myocardial Infarction, PAD;                 Risk Factors:Dyslipidemia and Hypertension.  Sonographer:    Maudry Mayhew MHA, RDMS, RVT, RDCS Referring Phys: [id number] [facility]  Sonographer Comments: Image acquisition challenging due to patient body habitus. IMPRESSIONS  1. Cannot fully assess wall motion. . Left ventricular ejection fraction, by estimation, is 40 to 45%. The left ventricle has mildly decreased function.  2. Right ventricular systolic function is normal. The right ventricular size is normal. There is moderately elevated pulmonary artery systolic pressure.  3. The mitral valve is grossly normal. No evidence of mitral valve regurgitation.  4. The aortic valve is grossly normal. Aortic valve regurgitation is not visualized.  5. The inferior vena cava is dilated in size with >50% respiratory variability, suggesting right atrial pressure of 8 mmHg. Comparison(s): No prior Echocardiogram. FINDINGS  Left Ventricle: Cannot fully assess wall motion. Left ventricular ejection fraction, by estimation, is 40 to 45%. The left ventricle has mildly decreased function. The left ventricular internal cavity size was normal in size. There is no left ventricular hypertrophy. Right Ventricle: The right ventricular size is normal. No increase in right ventricular wall thickness. Right ventricular systolic function is normal. There is moderately elevated pulmonary artery systolic pressure. The tricuspid regurgitant velocity is 3.50 m/s, and with an assumed right atrial pressure of 8 mmHg, the estimated right ventricular systolic pressure is 20.3 mmHg. Left Atrium: Left atrial size was normal in size. Right Atrium: Right atrial size was normal in size. Prominent Eustachian valve. Pericardium: There is no evidence of pericardial effusion. Mitral Valve: The mitral valve is grossly normal. No evidence  of mitral valve regurgitation. Tricuspid Valve: The tricuspid valve is grossly normal. Tricuspid valve regurgitation is not demonstrated. Aortic Valve: The aortic valve is grossly normal. Aortic valve regurgitation is not visualized. Aortic valve mean gradient measures 11.0 mmHg. Aortic valve peak gradient measures 22.7 mmHg. Aortic valve area, by VTI measures 0.89 cm. Pulmonic Valve: Pulmonic valve regurgitation is not visualized. Aorta: The aortic root and ascending aorta are structurally normal, with no evidence of dilitation. Venous: The inferior vena cava is dilated in size with greater than 50% respiratory variability, suggesting right atrial pressure of 8 mmHg.  LEFT VENTRICLE PLAX 2D LVIDd:         4.80 cm LVIDs:         3.40 cm LV PW:         0.60 cm LV IVS:        0.50 cm LVOT diam:     1.60 cm LV SV:         30 LV SV Index:   18 LVOT Area:     2.01 cm  RIGHT VENTRICLE TAPSE (M-mode): 1.7 cm LEFT ATRIUM           Index        RIGHT ATRIUM           Index LA diam:      3.40 cm 2.02 cm/m   RA Area:     16.80 cm LA Vol (A2C): 73.3 ml 43.51 ml/m  RA Volume:  43.30 ml  25.70 ml/m LA Vol (A4C): 33.7 ml 20.01 ml/m  AORTIC VALVE AV Area (Vmax):    0.90 cm AV Area (Vmean):   1.04 cm AV Area (VTI):     0.89 cm AV Vmax:           238.00 cm/s AV Vmean:          154.000 cm/s AV VTI:            0.340 m AV Peak Grad:      22.7 mmHg AV Mean Grad:      11.0 mmHg LVOT Vmax:         107.00 cm/s LVOT Vmean:        79.600 cm/s LVOT VTI:          0.151 m LVOT/AV VTI ratio: 0.44  AORTA Ao Root diam: 2.95 cm TRICUSPID VALVE TR Peak grad:   49.0 mmHg TR Vmax:        350.00 cm/s  SHUNTS Systemic VTI:  0.15 m Systemic Diam: 1.60 cm Phineas Inches Electronically signed by Phineas Inches Signature Date/Time: 09/28/2021/5:37:19 PM    Final      Assessment and Plan:   Acute on Chronic Combined CHF Patient was admitted with sepsis secondary to multilobar lobar pneumonia.  Echo showed LVEF of 40-45% but wall motion could not be  fully assessed.  RV normal with moderately elevated PASP.  LVEF was down from 50-55% back in 2016.  Patient was given 1 dose of IV Lasix 40 mg yesterday and another 10 mg today. Net positive 511 mL. - No significant peripheral edema but lungs sounds very course with diffuse rhonchi and he is requiring 8L of O2. - Will increase IV Lasix to 40mg  twice daily. - Will restart home Lopressor 50mg  twice daily for heart rate control. - Can try to add GDMT as he recovers from acute illness. - Continue to monitor daily weights, strict I/Os, and renal function.  Right Atrial Mass Right atrial mass concerning for thrombus noted on TTE.  - Will need TEE once respiratory status improves. - Will discuss anticoagulation with MD given brain mets.  Tachycardia Telemetry shows narrow complex tachycardia that is irregular. Multiple P waves can be see so I think this is sinus tachycardia with frequent PACs vs MAT. However, I will review with MD.  Suspect tachycardia is due to underlying pneumonia. Home beta-blocker was held on admission so may also have rebound tachycardia. - Will get 12 lead EKG. - Will restart home Lopressor 50mg  twice daily.  CAD History of remote MI in the 1990s s/p PTCA. -No angina. -Continue aspirin and statin.  AAA S/p repair in 2015 with persistent endograft leak which was stable on last CTA in 08/2020. - Continue to follow as an outpatient.  Hypertension BP well controlled. - Home Amlodipine and Lopressor were not continued on admission.  - Will add back Lopressor 50mg  twice daily given tachycardia.  Hyperlipidemia LDL 64 in 03/2020. - Continue Lipitor 20mg  daily.  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  In brief, the patient is a 81 y.o. 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 His course has been complicated by worsening acute hypoxic respiratory failure with concern for acute on chronic systolic heart failure exacerbation for which Cardiology has been consulted.   Patient with history of CAD with remote MI in 1992 treated with PTCA. Also with AAA s/p repair in 04/2014 with chronic type II endoleak. Has not followed regularly with Cardiology.  He presented on this admission with pneumonia with concern for possible aspiration given oropharyngeal cancer. He received IVF resuscitation and developed worsening respiratory status with evidence of volume overload requiring BiPAP. TTE on my review with EF 50%, normal RV, moderate pulmonary HTN, RA mass present.   Currently, the patient is severely dyspneic on exam with rhonchorous breath sounds and wheezing throughout. CXR with interval worsening consolidation of the right lung. He was given lasix 40mg  IV last night and 10mg  IV this AM.   Overall, patient unfortunately has poor prognosis. Suspect main driver of respiratory decompensation is related to multifocal pneumonia with superimposed pulmonary edema. TTE also notable for RA mass which is likely thrombus but cannot rule out vegetation or malignant spread given known oropharyngeal carcinoma.  May not be able to undergo  TEE to further evaluate RA mass due to oropharyngeal malignancy on XRT. Given known brain lesions, patient is not likely to be a candidate for [facility] but can discuss with onc. For now, will continue diuresis, resume BiPAP and continue management of underlying multifocal pneumonia.  GEN: Dyspneic with abdominal breathing Neck: Unable to assess due to need for BiPAP Cardiac: Tachycardic, irregular, no mumurs Respiratory: Diffuse rhonchi and expiratory wheezing GI: Soft, nontender, non-distended  MS: No edema; No deformity. Neuro:  Nonfocal  Psych: Agitated  Plan: -Resume BiPAP given respiratory distress -Start lasix 40mg  IV BID  -Management of multifocal pneumonia as likely the primary driver of symptoms -TEE to further delineate RA mass may not be an option given oropharyngeal carcinoma with XRT therapy; may also not be a candidate for [facility] due to brain lesions. Patient may also not want to pursue further invasive testing as not within [place]. Will discuss with primary team and onc --Suspect MAT on telemetry but may have brief runs of Afib; will resume metop and defer AC at this time given known brain lesions -Will need to have family discussion regarding Power as patient is high risk for needing intubation given degree of respiratory distress  Findings and plan discussed with the patient's family at bedside.   Gwyndolyn Kaufman, MD

## 2021-09-29 NOTE — Assessment & Plan Note (Addendum)
Likely secondary to multifocal pneumonia and systolic CHF.  2 D echocardiogram done from 09/28/2021 showed LV ejection fraction of 40 to 45%.  With increased respiratory distress dyspnea, morphine drip was initiated for comfort

## 2021-09-29 NOTE — Progress Notes (Addendum)
PROGRESS NOTE    Jordan Mccullough  QGB:[id number] DOB: [birth year] DOA: 10/07/2021 PCP: Tammi Sou, MD    Brief Narrative:  Jordan Mccullough is a 81 y.o. 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  Patient was febrile at home at 103 F.  In the ED, patient met SIRS criteria with leukocytosis tachycardia and fever but lactate was 0.8.  Patient received IV fluid bolus and IV antibiotic in the ED and was admitted hospital for further evaluation and treatment.  During hospitalization patient continued to have dyspnea and 2D echocardiogram was performed which showed reduced LV function.  Overnight on 09/28/2021 patient was having increased respiratory distress and received  IV Lasix and was placed on BiPAP.      Assessment and Plan: * Multifocal pneumonia- (present on admission) Concern for aspiration pneumonia due to difficulty with swallowing.    On dysphagia 2 diet.   Continue Unasyn and Zithromax for now.  Urinary Legionella antigen was positive.   Blood cultures negative in less than 2 days.  Urine culture negative so far.  Sputum culture with few gram-positive cocci in pairs and gram-negative rods.  MRSA PCR negative.  Still with leukocytosis.  Temperature max of 101.8 F.  Dyspnea- (present on admission) Likely multifactorial from pneumonia.  Patient has peripheral edema.  2D echocardiogram showed reduced LV function.  Continue with IV diuretics and BiPAP.  Incentive spirometry and flutter valve as able.  Acute respiratory failure with hypoxia (HCC) Likely secondary to multifocal pneumonia and systolic CHF.  Continue with IV antibiotics bronchodilators  BiPAP and IV Lasix for now.  Patient did have an echocardiogram almost 5 years back and the new echocardiogram done from 09/28/2021 showed LV ejection fraction of 40 to 45%.  Has been weaned to high flow nasal cannula this morning from BiPAP.  Oral thrush- (present on admission) We will continue nystatin swish and swallow.  Has been initiated on fluconazole for 3 weeks as per radiation oncology recommendation  Glottis carcinoma [facility])- (present on admission) Currently undergoing chemo + radiation.  Patient follows up with Dr. Julien Nordmann as outpatient.  Received radiation treatment yesterday.    Malnutrition of moderate degree Very high chances of malnutrition due to difficulty with swallowing throat issues and ongoing medical issues.  Seen by dietitian today.  Recommend supplemental nutrition.  Patient is very unlikely that he will meet all nutritional needs.  Acute systolic CHF (congestive heart failure) (HCC) 2D echocardiogram shows reduced function ejection fraction of 40 to 45%.  On IV diuretics at this time.  Will obtain cardiology consultation due to new onset congestive heart failure with reduced ejection fraction with recent need for BiPAP.  Received 1 dose of IV Lasix this morning in the paper chart.  We will continue IV Lasix daily starting tomorrow.  Follow cardiology recommendation.  Debility Deconditioning and generalized weakness.  Pending PT evaluation due to respiratory distress.  Hypomagnesemia- (present on admission) Improved after repletion.  Latest magnesium of 2.1.  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 seizures, not intractable, without status epilepticus ([place])- (present on admission) Continue lamictal   Essential hypertension- (present on admission) On amlodipine and metoprolol at home.  Currently on hold.  Primary cancer of  left upper lobe of lung ([place])- (present on admission) Despite h/o recurrent metastatic dz to brain  in 2016, this adenocarcinoma appears to be in remission currently.     Addendum:  09/29/2021 5:43 PM   I had a prolonged discussion with the patient's daughter and spouse at bedside regarding the goals of care.  At this time family wishes him no escalation of care.  Patient has expressed DO NOT RESUSCITATE in the past and family wishes DNR at this time.  If patient continues to decline, family might wish to proceed with comfort measures.   DVT prophylaxis: enoxaparin (LOVENOX) injection 40 mg Start: 09/27/21 1000   Code Status:     Code Status: DNR  Disposition: Home with home health/rehabilitation  Status is: Inpatient  Remains inpatient appropriate because: Undergoing radiation treatment, treatment for pneumonia with IV antibiotics, supplemental oxygen new congestive heart failure   Family Communication:  I spoke with the patient's spouse on the phone on 09/28/2021.  Spoke with the patient's daughter at bedside today.    Consultants:  Radiation oncology  Procedures:  Radiation treatment  Antimicrobials:  Unasyn IV 09/27/21>  Anti-infectives (From admission, onward)    Start     Dose/Rate Route Frequency Ordered Stop   09/29/21 1700  azithromycin (ZITHROMAX) 500 mg in sodium chloride 0.9 % 250 mL IVPB        500 mg 250 mL/hr over 60 Minutes Intravenous Every 24 hours 09/29/21 0841     09/28/21 1645  azithromycin (ZITHROMAX) tablet 500 mg  Status:  Discontinued        500 mg Oral Daily 09/28/21 1553 09/29/21 0841   09/28/21 1330  fluconazole (DIFLUCAN) tablet 100 mg        100 mg Oral Daily 09/28/21 1233 10/19/21 0959   09/27/21 0600  Ampicillin-Sulbactam (UNASYN) 3 g in sodium chloride 0.9 % 100 mL IVPB        3 g 200 mL/hr over 30 Minutes Intravenous Every 6 hours 09/27/21 0120     10/02/2021 2300  vancomycin (VANCOCIN) IVPB 1000 mg/200 mL premix        1,000 mg 200 mL/hr over 60 Minutes Intravenous  Once 10/15/2021 2247 09/27/21 0105   09/22/2021 2300  ceFEPIme (MAXIPIME) 2 g  in sodium chloride 0.9 % 100 mL IVPB        2 g 200 mL/hr over 30 Minutes Intravenous  Once 10/07/2021 2247 09/27/21 0002      Subjective:  Today, patient was seen and examined at bedside.  Patient had a lot of trouble breathing yesterday and received a BiPAP and Lasix.  This morning feels little better on high flow nasal cannula.  Denies any chest pain, patient's daughter at bedside.  Has been having some cough with productive sputum.  No fever or chills.    Objective: Vitals:   09/29/21 0614 09/29/21 0850 09/29/21 0927 09/29/21 1452  BP:   119/85 123/82  Pulse:   (!) 55 69  Resp: (!) 30  20 (!) 24  Temp:   98.9 F (37.2 C) 97.7 F (36.5 C)  TempSrc:   Oral Oral  SpO2:  95% 90% 100%  Weight:      Height:        Intake/Output Summary (Last 24 hours) at 09/29/2021 1743 Last data filed at 09/29/2021 8938 Gross per 24 hour  Intake 640 ml  Output 600 ml  Net 40 ml   Filed Weights   09/22/2021 2130 09/27/21 1437  Weight: 64 kg 63.8  kg    Physical Examination:.  General:  Average built, not in obvious distress, on 8 L of oxygen by nasal cannula, HENT:   No scleral pallor or icterus noted. Oral mucosa is moist.  Hoarse voice. Chest:  .  Diminished breath sounds noted bilaterally, coarse breath sounds noted. CVS: S1 &S2 heard. No murmur.  Regular rate and rhythm. Abdomen: Soft, nontender, nondistended.  Bowel sounds are heard.   Extremities: No cyanosis, clubbing but with bilateral peripheral edema.  Peripheral pulses are palpable. Psych: Alert, awake and communicative, able to speak in sentences, CNS:  No cranial nerve deficits.  Power equal in all extremities.   Skin: Warm and dry.  No rashes noted.  Data Reviewed:   CBC: Recent Labs  Lab 09/21/2021 2210 09/27/21 0323 09/28/21 0410 09/29/21 0348  WBC 19.6* 17.0* 12.2* 16.6*  NEUTROABS 16.6*  --   --   --   HGB 11.9* 10.9* 10.7* 10.3*  HCT 35.0* 32.3* 31.5* 30.4*  MCV 85.4 87.8 86.8 85.4  PLT 184 148* 146* 134*     Basic Metabolic Panel: Recent Labs  Lab 10/13/2021 2210 09/27/21 0323 09/28/21 0410 09/29/21 0348  NA 130* 132* 134* 134*  K 3.7 3.6 3.8 3.2*  CL 98 100 101 99  CO2 _0 GLUCOSE 111* 105* 114* 122*  BUN _1 CREATININE 0.89 0.88 0.89 0.92  CALCIUM 8.7* 8.2* 8.3* 8.1*  MG  --   --  1.6* 2.1  PHOS  --   --  2.6  --     Liver Function Tests: Recent Labs  Lab 10/02/2021 2210 09/28/21 0410  AST 11* 16  ALT 13 13  ALKPHOS 59 54  BILITOT 0.5 0.4  PROT 7.1 6.3*  ALBUMIN 3.4* 2.8*     Radiology Studies: DG CHEST PORT 1 VIEW  Result Date: 09/29/2021 CLINICAL DATA:  Dyspnea. EXAM: PORTABLE CHEST 1 VIEW COMPARISON:  Chest x-ray dated September 26, 2021. FINDINGS: Stable cardiomediastinal silhouette with surgical clips in the left hilum and similar left parahilar post treatment change. Worsened consolidation in the right mid and lower lung. No pneumothorax or large pleural effusion. No acute osseous abnormality. IMPRESSION: 1. Worsening multifocal pneumonia in the right lung. Electronically Signed   By: Titus Dubin M.D.   On: 09/29/2021 13:09   ECHOCARDIOGRAM COMPLETE  Result Date: 09/28/2021    ECHOCARDIOGRAM REPORT   Patient Name:   Jordan Mccullough Date of Exam: 09/28/2021 Medical Rec #:  [id number]      Height:       64.0 in Accession #:    [id number]     Weight:       140.7 lb Date of Birth:  1940-11-18       BSA:          1.685 m Patient Age:    16 years       BP:           128/84 mmHg Patient Gender: M              HR:           120 bpm. Exam Location:  Inpatient Procedure: 2D Echo, Cardiac Doppler and Color Doppler Indications:    Dyspnea  History:        Patient has prior history of Echocardiogram examinations, most                 recent 08/05/2015. CAD and Previous Myocardial Infarction, PAD;  Risk Factors:Dyslipidemia and Hypertension.  Sonographer:    Maudry Mayhew MHA, RDMS, RVT, RDCS Referring Phys: [id number] [facility] [place]wa Baris  Sonographer  Comments: Image acquisition challenging due to patient body habitus. IMPRESSIONS  1. Cannot fully assess wall motion. . Left ventricular ejection fraction, by estimation, is 40 to 45%. The left ventricle has mildly decreased function.  2. Right ventricular systolic function is normal. The right ventricular size is normal. There is moderately elevated pulmonary artery systolic pressure.  3. The mitral valve is grossly normal. No evidence of mitral valve regurgitation.  4. The aortic valve is grossly normal. Aortic valve regurgitation is not visualized.  5. The inferior vena cava is dilated in size with >50% respiratory variability, suggesting right atrial pressure of 8 mmHg. Comparison(s): No prior Echocardiogram. FINDINGS  Left Ventricle: Cannot fully assess wall motion. Left ventricular ejection fraction, by estimation, is 40 to 45%. The left ventricle has mildly decreased function. The left ventricular internal cavity size was normal in size. There is no left ventricular hypertrophy. Right Ventricle: The right ventricular size is normal. No increase in right ventricular wall thickness. Right ventricular systolic function is normal. There is moderately elevated pulmonary artery systolic pressure. The tricuspid regurgitant velocity is 3.50 m/s, and with an assumed right atrial pressure of 8 mmHg, the estimated right ventricular systolic pressure is 12.7 mmHg. Left Atrium: Left atrial size was normal in size. Right Atrium: Right atrial size was normal in size. Prominent Eustachian valve. Pericardium: There is no evidence of pericardial effusion. Mitral Valve: The mitral valve is grossly normal. No evidence of mitral valve regurgitation. Tricuspid Valve: The tricuspid valve is grossly normal. Tricuspid valve regurgitation is not demonstrated. Aortic Valve: The aortic valve is grossly normal. Aortic valve regurgitation is not visualized. Aortic valve mean gradient measures 11.0 mmHg. Aortic valve peak gradient measures  22.7 mmHg. Aortic valve area, by VTI measures 0.89 cm. Pulmonic Valve: Pulmonic valve regurgitation is not visualized. Aorta: The aortic root and ascending aorta are structurally normal, with no evidence of dilitation. Venous: The inferior vena cava is dilated in size with greater than 50% respiratory variability, suggesting right atrial pressure of 8 mmHg.  LEFT VENTRICLE PLAX 2D LVIDd:         4.80 cm LVIDs:         3.40 cm LV PW:         0.60 cm LV IVS:        0.50 cm LVOT diam:     1.60 cm LV SV:         30 LV SV Index:   18 LVOT Area:     2.01 cm  RIGHT VENTRICLE TAPSE (M-mode): 1.7 cm LEFT ATRIUM           Index        RIGHT ATRIUM           Index LA diam:      3.40 cm 2.02 cm/m   RA Area:     16.80 cm LA Vol (A2C): 73.3 ml 43.51 ml/m  RA Volume:   43.30 ml  25.70 ml/m LA Vol (A4C): 33.7 ml 20.01 ml/m  AORTIC VALVE AV Area (Vmax):    0.90 cm AV Area (Vmean):   1.04 cm AV Area (VTI):     0.89 cm AV Vmax:           238.00 cm/s AV Vmean:          154.000 cm/s AV VTI:  0.340 m AV Peak Grad:      22.7 mmHg AV Mean Grad:      11.0 mmHg LVOT Vmax:         107.00 cm/s LVOT Vmean:        79.600 cm/s LVOT VTI:          0.151 m LVOT/AV VTI ratio: 0.44  AORTA Ao Root diam: 2.95 cm TRICUSPID VALVE TR Peak grad:   49.0 mmHg TR Vmax:        350.00 cm/s  SHUNTS Systemic VTI:  0.15 m Systemic Diam: 1.60 cm Jordan Mccullough Electronically signed by Jordan Mccullough Signature Date/Time: 09/28/2021/5:37:19 PM    Final       LOS: 3 days    Flora Lipps, MD [facility] 09/29/2021, 5:43 PM

## 2021-09-29 NOTE — Progress Notes (Signed)
° ° °  OVERNIGHT PROGRESS REPORT  Notified by RN for increased difficulty in breathing tachycardia, tachypnea.  Patient was tachycardic in the 150s, SPO2 was 86% -90% on high flow nasal cannula and nonrebreather mask.  Patient has been given Xopenex breathing treatment.  Upon arrival to bedside patient was tripod position on the side of the bed with obvious Rhonchi/Rales and a respiratory rate in the upper 30s.  40 mg Lasix IV was given with improvement and patient is now on BiPAP.   Update: 0640 Hrs  At 30 minutes more patient has obvious improvement heart rate has decreased to 110's sats are 100% and patient states that he is greatly improved.       Gershon Cull MSNA MSN ACNPC-AG Acute Care Nurse Practitioner [place]

## 2021-09-30 ENCOUNTER — Ambulatory Visit: Payer: Medicare Other

## 2021-09-30 ENCOUNTER — Other Ambulatory Visit: Payer: Self-pay | Admitting: Radiation Therapy

## 2021-09-30 ENCOUNTER — Ambulatory Visit: Payer: Medicare Other | Admitting: Family Medicine

## 2021-09-30 DIAGNOSIS — Z7189 Other specified counseling: Secondary | ICD-10-CM

## 2021-09-30 DIAGNOSIS — J189 Pneumonia, unspecified organism: Secondary | ICD-10-CM | POA: Diagnosis not present

## 2021-09-30 DIAGNOSIS — R5381 Other malaise: Secondary | ICD-10-CM | POA: Diagnosis not present

## 2021-09-30 DIAGNOSIS — J9601 Acute respiratory failure with hypoxia: Secondary | ICD-10-CM | POA: Diagnosis not present

## 2021-09-30 DIAGNOSIS — I5021 Acute systolic (congestive) heart failure: Secondary | ICD-10-CM | POA: Diagnosis not present

## 2021-09-30 LAB — BASIC METABOLIC PANEL
Anion gap: 17 — ABNORMAL HIGH (ref 5–15)
BUN: 43 mg/dL — ABNORMAL HIGH (ref 8–23)
CO2: 11 mmol/L — ABNORMAL LOW (ref 22–32)
Calcium: 9.3 mg/dL (ref 8.9–10.3)
Chloride: 107 mmol/L (ref 98–111)
Creatinine, Ser: 1.22 mg/dL (ref 0.61–1.24)
GFR, Estimated: >60 mL/min (ref >60)
Glucose, Bld: 180 mg/dL — ABNORMAL HIGH (ref 70–99)
Potassium: 3.6 mmol/L (ref 3.5–5.1)
Sodium: 135 mmol/L (ref 135–145)

## 2021-09-30 LAB — CBC
HCT: 32 % — ABNORMAL LOW (ref 39.0–52.0)
Hemoglobin: 10.4 g/dL — ABNORMAL LOW (ref 13.0–17.0)
MCH: 30.1 pg (ref 26.0–34.0)
MCHC: 32.5 g/dL (ref 30.0–36.0)
MCV: 92.5 fL (ref 80.0–100.0)
Platelets: 162 10*3/uL (ref 150–400)
RBC: 3.46 MIL/uL — ABNORMAL LOW (ref 4.22–5.81)
RDW: 12.9 % (ref 11.5–15.5)
WBC: 4.4 10*3/uL (ref 4.0–10.5)
nRBC: 0 % (ref 0.0–0.2)

## 2021-09-30 LAB — LEGIONELLA PNEUMOPHILA SEROGP 1 UR AG: L. pneumophila Serogp 1 Ur Ag: POSITIVE — AB

## 2021-09-30 LAB — MAGNESIUM: Magnesium: 1.8 mg/dL (ref 1.7–2.4)

## 2021-09-30 MED ORDER — HALOPERIDOL 0.5 MG PO TABS
0.5000 mg | ORAL_TABLET | ORAL | Status: DC | PRN
  Filled 2021-09-30: qty 1

## 2021-09-30 MED ORDER — MORPHINE SULFATE (PF) 2 MG/ML IV SOLN
2.0000 mg | Freq: Once | INTRAVENOUS | Status: AC
  Administered 2021-09-30: 2 mg via INTRAVENOUS
  Filled 2021-09-30: qty 1

## 2021-09-30 MED ORDER — LORAZEPAM 2 MG/ML PO CONC: 1.0000 mg | ORAL | Status: DC | PRN

## 2021-09-30 MED ORDER — LORAZEPAM 1 MG PO TABS: 1.0000 mg | ORAL_TABLET | ORAL | Status: DC | PRN

## 2021-09-30 MED ORDER — MORPHINE SULFATE (PF) 2 MG/ML IV SOLN
2.0000 mg | INTRAVENOUS | Status: DC | PRN
Start: 2021-09-30 — End: 2021-09-30
  Administered 2021-09-30 (×2): 2 mg via INTRAVENOUS
  Filled 2021-09-30 (×2): qty 1

## 2021-09-30 MED ORDER — GLYCOPYRROLATE 1 MG PO TABS
1.0000 mg | ORAL_TABLET | ORAL | Status: DC | PRN
  Filled 2021-09-30: qty 1

## 2021-09-30 MED ORDER — BISACODYL 10 MG RE SUPP: 10.0000 mg | Freq: Every day | RECTAL | Status: DC | PRN

## 2021-09-30 MED ORDER — MORPHINE 100MG IN NS 100ML (1MG/ML) PREMIX INFUSION
1.0000 mg/h | INTRAVENOUS | Status: DC
  Administered 2021-09-30: 2 mg/h via INTRAVENOUS
  Administered 2021-10-01: 5 mg/h via INTRAVENOUS
  Filled 2021-09-30 (×2): qty 100

## 2021-09-30 MED ORDER — ONDANSETRON 4 MG PO TBDP: 4.0000 mg | ORAL_TABLET | Freq: Four times a day (QID) | ORAL | Status: DC | PRN

## 2021-09-30 MED ORDER — HALOPERIDOL LACTATE 2 MG/ML PO CONC
0.5000 mg | ORAL | Status: DC | PRN
  Filled 2021-09-30: qty 0.3

## 2021-09-30 MED ORDER — LORAZEPAM 2 MG/ML IJ SOLN
1.0000 mg | INTRAMUSCULAR | Status: DC | PRN
  Administered 2021-09-30 (×2): 1 mg via INTRAVENOUS
  Filled 2021-09-30 (×2): qty 1

## 2021-09-30 MED ORDER — MORPHINE SULFATE (PF) 2 MG/ML IV SOLN
2.0000 mg | INTRAVENOUS | Status: DC | PRN
  Administered 2021-09-30: 2 mg via INTRAVENOUS
  Filled 2021-09-30: qty 1

## 2021-09-30 MED ORDER — LORAZEPAM 2 MG/ML IJ SOLN
1.0000 mg | INTRAMUSCULAR | Status: DC | PRN
  Administered 2021-09-30: 1 mg via INTRAVENOUS
  Filled 2021-09-30: qty 1

## 2021-09-30 MED ORDER — GLYCOPYRROLATE 0.2 MG/ML IJ SOLN
0.2000 mg | INTRAMUSCULAR | Status: DC | PRN
  Administered 2021-10-01: 0.2 mg via INTRAVENOUS
  Filled 2021-09-30: qty 1

## 2021-09-30 MED ORDER — HALOPERIDOL LACTATE 5 MG/ML IJ SOLN: 0.5000 mg | INTRAMUSCULAR | Status: DC | PRN

## 2021-09-30 MED ORDER — ONDANSETRON HCL 4 MG/2ML IJ SOLN: 4.0000 mg | Freq: Four times a day (QID) | INTRAMUSCULAR | Status: DC | PRN

## 2021-09-30 MED ORDER — GLYCOPYRROLATE 0.2 MG/ML IJ SOLN: 0.2000 mg | INTRAMUSCULAR | Status: DC | PRN

## 2021-09-30 MED ORDER — MORPHINE SULFATE (PF) 4 MG/ML IV SOLN
4.0000 mg | Freq: Once | INTRAVENOUS | Status: AC
  Administered 2021-09-30: 4 mg via INTRAVENOUS
  Filled 2021-09-30: qty 1

## 2021-09-30 MED FILL — Dexamethasone Sodium Phosphate Inj 100 MG/10ML: INTRAMUSCULAR | Qty: 1 | Status: AC

## 2021-09-30 MED FILL — Fosaprepitant Dimeglumine For IV Infusion 150 MG (Base Eq): INTRAVENOUS | Qty: 5 | Status: AC

## 2021-09-30 NOTE — Evaluation (Addendum)
SLP Cancellation Note  Patient Details Name: Jordan Mccullough MRN: [id number] DOB: Aug 02, 1941   Cancelled treatment:       Reason Eval/Treat Not Completed: Other (comment) (pt currently not medically appropirate for MBS due to his work of breathing, accessory muscle use noted)  RN reports pt gurgly.    Macario Golds 09/30/2021, 8:03 AM   Kathleen Lime, MS [facility] SLP [place] Office [phone] Cell [phone]

## 2021-09-30 NOTE — Progress Notes (Signed)
MD is rounding on the Patient and talked to the family. New orders placed and Pt is now [place]. Support to the Patient and the Family given.

## 2021-09-30 NOTE — Progress Notes (Signed)
Morphine Drip started per new orders by MD. Pt's family educated and emotional support given.

## 2021-09-30 NOTE — Progress Notes (Signed)
PROGRESS NOTE    Jordan Mccullough  MOQ:[id number] DOB: 1940/09/19 DOA: 09/25/2021 PCP: Tammi Sou, MD    Brief Narrative:  Jordan GRIGG is a 81 y.o. 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  Patient was febrile at home at 103 F.  In the ED, patient met SIRS criteria with leukocytosis tachycardia and fever but lactate was 0.8.  Patient received IV fluid bolus and IV antibiotic in the ED and was admitted hospital for further evaluation and treatment.  During hospitalization patient continued to have dyspnea and 2D echocardiogram was performed which showed reduced LV function.  Overnight on 09/28/2021 patient was having increased respiratory distress and received  IV Lasix and was placed on BiPAP.      Assessment and Plan: * Multifocal pneumonia- (present on admission) Concern for aspiration pneumonia due to difficulty with swallowing.    On dysphagia 2 diet.  on  Unasyn and Zithromax.  Urinary Legionella antigen was positive.   Blood cultures negative in less than 4 days.  Urine culture negative.Marland Kitchen  Sputum culture with few gram-positive cocci in pairs and gram-negative rods.  MRSA PCR negative.  Leukocytosis has improved.  Temperature max of 98.4 F.  Patient still with respiratory distress requiring BiPAP with increased work of breathing and dyspnea.  Dyspnea- (present on admission) Likely multifactorial from pneumonia.  Patient has peripheral edema.  2D echocardiogram showed reduced LV function.  Cardiology was consulted for the same.  Received IV diuretics and BiPAP.  Incentive spirometry and flutter valve as able.  At this time patient has  increased work of breathing and does not wish to be on BiPAP.  He wishes to be comfortable.  Acute respiratory failure with hypoxia (HCC) Likely secondary to multifocal pneumonia and systolic CHF.  2 D echocardiogram done from 09/28/2021 showed LV ejection fraction of 40 to 45%.  With increased respiratory distress dyspnea.  Patient refusing BiPAP goals of care have changed to comfort based at this time..  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  Patient follows up with Dr. Julien Nordmann as outpatient.  Received radiation treatment during hospitalization.  Patient is unstable for further treatment at this time.    Acute systolic CHF (congestive heart failure) (HCC) 2D echocardiogram shows reduced function ejection fraction of 40 to 45%.  On IV diuretics, supplemental oxygen.  Cardiology has seen the patient..  In respiratory distress at this time.  Primary cancer of left upper lobe of lung ([place])- (present on admission) Despite h/o recurrent metastatic dz to brain in 2016, this adenocarcinoma appears to be in remission currently.    Debility Deconditioning and generalized weakness.   Essential hypertension- (present on admission) On amlodipine and metoprolol at home.  Currently on hold.  Hypomagnesemia- (present on admission) Improved after repletion.  Latest magnesium of 1.8  Malnutrition of moderate degree Very high chances of malnutrition due to difficulty with swallowing throat issues and ongoing medical issues.  Seen by dietitian.  Recommend supplemental nutrition.  Patient is very unlikely that he will meet all nutritional needs.  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  Patient has clearly expressed that he did not wish to be resuscitated and put on a life support.  At this time he is expressing that he wishes to be comfortable.  After discussion with the family plan is to proceed with comfort care and transition to hospice.  Patient is DNR and we will plan for no escalation of care.  We will initiate her morphine and Ativan as needed for comfort.  Patient wishes to go home if possible so TOC has been consulted to assess for hospice care at home but due to his oxygen requirement might not be possible.     DVT prophylaxis: enoxaparin (LOVENOX) injection 40 mg Start: 09/27/21 1000   Code Status:     Code Status: DNR  Disposition: Likely in the hospital death/hospice if possible  Status is: Inpatient  Remains inpatient appropriate because: Respiratory distress, declining condition, comfort care   Family Communication:  I had a prolonged discussion with the patient's spouse, daughter and son-in-law at bedside at multiple occasions.  Consultants:  Radiation oncology  Procedures:  Radiation treatment  Antimicrobials:  Unasyn IV 09/27/21> Azithromycin 2/8>  Subjective:  Today, patient was seen and examined at bedside.  Nursing staff reported that patient was having trouble breathing with respiratory distress anxiety with a lot of wheezing and required BiPAP in the morning.  Patient clearly expressing that he does not want to be on any life-sustaining measures including BiPAP and wants to be comfortable.  Family at bedside as well.   Objective: Vitals:   09/30/21 0014 09/30/21 0054 09/30/21 0405 09/30/21 0810  BP: 126/81  (!) 143/81   Pulse: (!) 106  (!) 108 (!) 122  Resp: 20   (!) 31  Temp: 98.4 F (36.9 C)  98.2 F (36.8 C)   TempSrc:  Oral  Oral   SpO2: (!) 78% 94% 93% 97%  Weight:      Height:        Intake/Output Summary (Last 24 hours) at 09/30/2021 1422 Last data filed at 09/30/2021 0700 Gross per 24 hour  Intake 720.3 ml  Output --  Net 720.3 ml   Filed Weights   10/10/2021 2130 09/27/21 1437  Weight: 64 kg 63.8 kg   Body mass index is 24.14 kg/m.   Physical Examination:.  General:  Average built, in moderate respiratory distress, increased work of breathing, audible wheezes, on BiPAP at the time of my evaluation elderly male, deconditioned HENT:   No scleral pallor or icterus noted. Oral mucosa is moist.  Chest: Bilateral diffuse wheezes noted, diminished breath sounds bilaterally.  Tachypnea with increased work of breathing CVS: S1 &S2 heard. No murmur.  Regular rate and rhythm. Abdomen: Soft, nontender, nondistended.  Bowel sounds are heard.   Extremities: No cyanosis, clubbing with peripheral edema peripheral pulses are palpable. Psych: Alert, awake and oriented, communicative, able to speak in sentences, CNS:  No cranial nerve deficits.  Power equal in all extremities.   Skin: Warm and dry.  No rashes noted.  Data Reviewed:   CBC: Recent Labs  Lab 09/30/2021 2210 09/27/21 0323 09/28/21 0410 09/29/21 0348 09/30/21 0714  WBC 19.6* 17.0* 12.2* 16.6* 4.4  NEUTROABS 16.6*  --   --   --   --  HGB 11.9* 10.9* 10.7* 10.3* 10.4*  HCT 35.0* 32.3* 31.5* 30.4* 32.0*  MCV 85.4 87.8 86.8 85.4 92.5  PLT 184 148* 146* 134* 694    Basic Metabolic Panel: Recent Labs  Lab 10/18/2021 2210 09/27/21 0323 09/28/21 0410 09/29/21 0348 09/30/21 0617  NA 130* 132* 134* 134* 135  K 3.7 3.6 3.8 3.2* 3.6  CL 98 100 101 99 107  CO2 _0 11*  GLUCOSE 111* 105* 114* 122* 180*  BUN _1 43*  CREATININE 0.89 0.88 0.89 0.92 1.22  CALCIUM 8.7* 8.2* 8.3* 8.1* 9.3  MG  --   --  1.6* 2.1 1.8  PHOS  --   --  2.6  --   --     Liver Function Tests: Recent Labs  Lab 10/02/2021 2210 09/28/21 0410  AST  11* 16  ALT 13 13  ALKPHOS 59 54  BILITOT 0.5 0.4  PROT 7.1 6.3*  ALBUMIN 3.4* 2.8*     Radiology Studies: DG CHEST PORT 1 VIEW  Result Date: 09/29/2021 CLINICAL DATA:  Dyspnea. EXAM: PORTABLE CHEST 1 VIEW COMPARISON:  Chest x-ray dated September 26, 2021. FINDINGS: Stable cardiomediastinal silhouette with surgical clips in the left hilum and similar left parahilar post treatment change. Worsened consolidation in the right mid and lower lung. No pneumothorax or large pleural effusion. No acute osseous abnormality. IMPRESSION: 1. Worsening multifocal pneumonia in the right lung. Electronically Signed   By: Titus Dubin M.D.   On: 09/29/2021 13:09   ECHOCARDIOGRAM COMPLETE  Result Date: 09/29/2021    ECHOCARDIOGRAM REPORT   Patient Name:   KERVENS ROPER Date of Exam: 09/28/2021 Medical Rec #:  [id number]      Height:       64.0 in Accession #:    [id number]     Weight:       140.7 lb Date of Birth:  1941/05/11       BSA:          1.685 m Patient Age:    76 years       BP:           128/84 mmHg Patient Gender: M              HR:           120 bpm. Exam Location:  Inpatient Procedure: 2D Echo, Cardiac Doppler and Color Doppler Indications:     Dyspnea  History:         Patient has prior history of Echocardiogram examinations, most                  recent 08/05/2015. CAD and Previous Myocardial Infarction, PAD;                  Risk Factors:Dyslipidemia and Hypertension.  Sonographer:     Maudry Mayhew MHA, RDMS, RVT, RDCS Referring Phys:  [id number] [facility] Jaleiah Asay Diagnosing Phys: [facility]  Sonographer Comments: Image acquisition challenging due to patient body habitus. IMPRESSIONS  1. Cannot fully assess wall motion. . Left ventricular ejection fraction, by estimation, is 40 to 45%. The left ventricle has mildly decreased function.  2. Right ventricular systolic function is normal. The right ventricular size is normal. There is moderately elevated pulmonary artery systolic pressure.  3. RA mass  present, differential includes RA thrombus vs. eustachian valve.  4. The mitral valve is grossly normal. No evidence of mitral valve regurgitation.  5. The aortic valve is grossly normal.  Aortic valve regurgitation is not visualized.  6. The inferior vena cava is dilated in size with >50% respiratory variability, suggesting right atrial pressure of 8 mmHg. Comparison(s): No prior Echocardiogram. FINDINGS  Left Ventricle: Cannot fully assess wall motion. Left ventricular ejection fraction, by estimation, is 40 to 45%. The left ventricle has mildly decreased function. The left ventricular internal cavity size was normal in size. There is no left ventricular hypertrophy. Right Ventricle: The right ventricular size is normal. No increase in right ventricular wall thickness. Right ventricular systolic function is normal. There is moderately elevated pulmonary artery systolic pressure. The tricuspid regurgitant velocity is 3.50 m/s, and with an assumed right atrial pressure of 8 mmHg, the estimated right ventricular systolic pressure is 49.6 mmHg. Left Atrium: Left atrial size was normal in size. Right Atrium: RA mass present, differential includes RA thrombus vs. eustachian valve. Right atrial size was normal in size. Pericardium: There is no evidence of pericardial effusion. Mitral Valve: The mitral valve is grossly normal. No evidence of mitral valve regurgitation. Tricuspid Valve: The tricuspid valve is grossly normal. Tricuspid valve regurgitation is not demonstrated. Aortic Valve: The aortic valve is grossly normal. Aortic valve regurgitation is not visualized. Aortic valve mean gradient measures 11.0 mmHg. Aortic valve peak gradient measures 22.7 mmHg. Aortic valve area, by VTI measures 0.89 cm. Pulmonic Valve: Pulmonic valve regurgitation is not visualized. Aorta: The aortic root and ascending aorta are structurally normal, with no evidence of dilitation. Venous: The inferior vena cava is dilated in size with  greater than 50% respiratory variability, suggesting right atrial pressure of 8 mmHg.  LEFT VENTRICLE PLAX 2D LVIDd:         4.80 cm LVIDs:         3.40 cm LV PW:         0.60 cm LV IVS:        0.50 cm LVOT diam:     1.60 cm LV SV:         30 LV SV Index:   18 LVOT Area:     2.01 cm  RIGHT VENTRICLE TAPSE (M-mode): 1.7 cm LEFT ATRIUM           Index        RIGHT ATRIUM           Index LA diam:      3.40 cm 2.02 cm/m   RA Area:     16.80 cm LA Vol (A2C): 73.3 ml 43.51 ml/m  RA Volume:   43.30 ml  25.70 ml/m LA Vol (A4C): 33.7 ml 20.01 ml/m  AORTIC VALVE AV Area (Vmax):    0.90 cm AV Area (Vmean):   1.04 cm AV Area (VTI):     0.89 cm AV Vmax:           238.00 cm/s AV Vmean:          154.000 cm/s AV VTI:            0.340 m AV Peak Grad:      22.7 mmHg AV Mean Grad:      11.0 mmHg LVOT Vmax:         107.00 cm/s LVOT Vmean:        79.600 cm/s LVOT VTI:          0.151 m LVOT/AV VTI ratio: 0.44  AORTA Ao Root diam: 2.95 cm TRICUSPID VALVE TR Peak grad:   49.0 mmHg TR Vmax:        350.00 cm/s  SHUNTS  Systemic VTI:  0.15 m Systemic Diam: 1.60 cm Phineas Inches Electronically signed by Phineas Inches Signature Date/Time: 09/28/2021/5:37:19 PM    Final (Updated)       LOS: 4 days    Flora Lipps, MD [facility] 09/30/2021, 2:22 PM

## 2021-09-30 NOTE — Progress Notes (Signed)
Pt noted to have increased work of breathing. Resp pattern very labored with use of accessory muscles. Pt anxious. Lung fields congested Rhonci and wheezing noted throughout. Pt is on 15 liters high flow oxygen with Oxygen saturations 86-88%. Pt at this time is refusing Bi-Pap states "No more I don't want anything" Per Patient request Family called and they are on their way to the hospital. MD to be updated on Pt's condition.

## 2021-09-30 NOTE — Progress Notes (Signed)
Chaplain provided support to Jordan Mccullough and his family over several visits throughout the day.  HIs wife, Jordan Mccullough, requested prayer and chaplain provided prayer.  Chaplain also provided emotional and grief support to his son and daughter.  Please page as needs arise or as family requests.  [place], Bcc Pager, [phone] 4:52 PM

## 2021-09-30 NOTE — Progress Notes (Signed)
Pt at this time after 4 mg Morphine given per orders resting more comfortably. Resp pattern less labored and restlessness has decreased. Family remains at bedside Support given to the Pt and Pt's Family. Monitor closley and maintain current plan of care

## 2021-09-30 NOTE — Progress Notes (Signed)
PT Cancellation Note  Patient Details Name: Jordan Mccullough MRN: [id number] DOB: 03/15/1941   Cancelled Treatment:    Reason Eval/Treat Not Completed: Other (comment) (PT received discontinue orders, please re-consult if needs arise) Signing off at this time, d/c orders received.    Tori Maizee Reinhold PT, DPT 09/30/21, 10:50 AM

## 2021-09-30 NOTE — Progress Notes (Signed)
Patient remains with increased work of breathing very labored and use of accessory muscles. Pt is also very restless in the bed. MD updated that Patient demonstrates no comfort after IV Morphine and IV Ativan that was ordered and given. MD to place new orders and per new orders 4 mg IV Morphine given. Family updated and given support

## 2021-09-30 NOTE — Assessment & Plan Note (Addendum)
Received a morphine drip for comfort care

## 2021-09-30 NOTE — TOC Progression Note (Signed)
Transition of Care [facility]) - Progression Note    Patient Details  Name: LOUDON KRAKOW MRN: [id number] Date of Birth: 03-24-1941  [facility] CM/SW Contact  Purcell Mouton, RN Phone Number: 09/30/2021, 11:23 AM  Clinical Narrative:    Spoke with pt's adult children concerning pt's going home with Hospice. Pt and family want to take pt home. Pt is on 15L HF NRB and not stable to transport home. Explained this to pt's children. Daughter asked if pt was intubated could he be transported home. Explained that pt will not be intubated related to no medical reason to intubate pt. Children understood. Encouraged family to enjoy pt now. Chaplain here to see family.    Expected Discharge Plan: Home w Hospice Care Barriers to Discharge: No Barriers Identified  Expected Discharge Plan and Services Expected Discharge Plan: [place] In-house Referral: Chaplain Discharge Planning Services: CM Consult Post Acute Care Choice: Hospice Living arrangements for the past 2 months: Single Family Home                                       Social Determinants of Health (SDOH) Interventions    Readmission Risk Interventions No flowsheet data found.

## 2021-09-30 NOTE — Progress Notes (Signed)
SLP received orders to discontinue.  Please re-consult if pt medically improves and swallow eval/treatment is indicated.    Kathleen Lime, [place] SLP [place] Office [phone] Cell [phone]

## 2021-09-30 NOTE — Progress Notes (Signed)
MD responded with new orders. A one time dose of Morphine 2 mg IV given.

## 2021-09-30 NOTE — Progress Notes (Signed)
Pt has become very restless in the bed. Resp status now again very labored with use of accessory muscles. On 15l and Non Rebreather Oxygen Saturation 90%. PRN IV Morphine given as per orders and MD sent secure chat to update on Pt's status

## 2021-09-30 NOTE — Progress Notes (Signed)
Patient transitioned to comfort. Cardiology will sign-off

## 2021-10-01 DIAGNOSIS — R0600 Dyspnea, unspecified: Secondary | ICD-10-CM | POA: Diagnosis not present

## 2021-10-01 DIAGNOSIS — I10 Essential (primary) hypertension: Secondary | ICD-10-CM | POA: Diagnosis not present

## 2021-10-01 DIAGNOSIS — R5381 Other malaise: Secondary | ICD-10-CM | POA: Diagnosis not present

## 2021-10-01 DIAGNOSIS — J189 Pneumonia, unspecified organism: Secondary | ICD-10-CM | POA: Diagnosis not present

## 2021-10-01 DIAGNOSIS — A419 Sepsis, unspecified organism: Secondary | ICD-10-CM

## 2021-10-01 LAB — CULTURE, BLOOD (ROUTINE X 2)
Culture: NO GROWTH
Culture: NO GROWTH
Special Requests: ADEQUATE
Special Requests: ADEQUATE

## 2021-10-01 MED ORDER — LEVALBUTEROL HCL 0.63 MG/3ML IN NEBU: 0.6300 mg | INHALATION_SOLUTION | Freq: Four times a day (QID) | RESPIRATORY_TRACT | Status: DC | PRN

## 2021-10-03 ENCOUNTER — Inpatient Hospital Stay: Payer: Medicare Other | Admitting: Physician Assistant

## 2021-10-03 ENCOUNTER — Inpatient Hospital Stay: Payer: Medicare Other

## 2021-10-03 ENCOUNTER — Ambulatory Visit: Payer: Medicare Other

## 2021-10-03 ENCOUNTER — Encounter: Payer: Self-pay | Admitting: Radiation Oncology

## 2021-10-04 ENCOUNTER — Ambulatory Visit: Payer: Medicare Other

## 2021-10-05 ENCOUNTER — Ambulatory Visit: Payer: Medicare Other

## 2021-10-06 ENCOUNTER — Ambulatory Visit: Payer: Medicare Other

## 2021-10-06 ENCOUNTER — Ambulatory Visit: Payer: Medicare Other | Admitting: Neurology

## 2021-10-07 ENCOUNTER — Ambulatory Visit: Payer: Medicare Other

## 2021-10-10 ENCOUNTER — Inpatient Hospital Stay: Payer: Medicare Other

## 2021-10-10 ENCOUNTER — Ambulatory Visit: Payer: Medicare Other

## 2021-10-10 ENCOUNTER — Inpatient Hospital Stay: Payer: Medicare Other | Admitting: Internal Medicine

## 2021-10-11 ENCOUNTER — Ambulatory Visit: Payer: Medicare Other

## 2021-10-12 ENCOUNTER — Ambulatory Visit: Payer: Medicare Other

## 2021-10-13 ENCOUNTER — Ambulatory Visit: Payer: Medicare Other

## 2021-10-14 ENCOUNTER — Ambulatory Visit: Payer: Medicare Other

## 2021-10-17 ENCOUNTER — Inpatient Hospital Stay: Payer: Medicare Other

## 2021-10-17 ENCOUNTER — Ambulatory Visit: Payer: Medicare Other

## 2021-10-18 ENCOUNTER — Ambulatory Visit: Payer: Medicare Other

## 2021-10-19 ENCOUNTER — Ambulatory Visit: Payer: Medicare Other

## 2021-10-19 NOTE — Progress Notes (Signed)
Morphine sulfate 95 ml wasted in the stericycle witness by CN Brien Mates. SRP, RN

## 2021-10-19 NOTE — Progress Notes (Signed)
Pt MSO4 gtt increased to 5 mg/hr. Previous assessment unchanged. SRP,RN

## 2021-10-19 NOTE — Progress Notes (Signed)
MD rounding, pt resting COMFORTABLY, without distress, MSO4 gtt infusing @ 4 mg/hr, O2 @ 6 liters. Gentle mouthcare complete..Son and dtg at [facility], emotional support provided.

## 2021-10-19 NOTE — Progress Notes (Signed)
Medications not given, MD made aware and will review. Will follow up per MD. Pt comfort care SRP, RN

## 2021-10-19 NOTE — Progress Notes (Signed)
[place] called spoke with Jordan Mccullough, pt r/o for donor donations due age, dx, and time of death. Family departing and all person belonging taken with family. Post mortem care complete. SRP, RN

## 2021-10-19 NOTE — Death Summary Note (Addendum)
DEATH SUMMARY   Patient Details  Name: Jordan Mccullough MRN: [id number] DOB: 10/18/1940 YKD:[id number], Adrian Blackwater, MD  Admission/Discharge Information   Admit Date:  2021-10-05  Date of Death: Date of Death: Oct 10, 2021  Time of Death: Time of Death: 7  Length of Stay: 5   Principle Cause of death: Multifocal pneumonia  Hospital Diagnoses: Principal Problem:   Multifocal pneumonia Active Problems:   Dyspnea   Acute respiratory failure with hypoxia (HCC)   Glottis carcinoma (HCC)   Oral thrush   Acute systolic CHF (congestive heart failure) ([place])   Primary cancer of left upper lobe of lung ([place])   Debility   Essential hypertension   Hypomagnesemia   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 seizures, not intractable, without status epilepticus ([place])   Malnutrition of moderate degree   Goals of care, counseling/discussion   Sepsis [facility])   Hospital Course: Jordan Mccullough is a 81 y.o. 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  Patient was febrile at home at 103 F.  In the ED, patient met SIRS criteria with leukocytosis tachycardia and fever but lactate was 0.8.  Patient received IV fluid bolus and IV antibiotic in the ED and was admitted hospital for further evaluation and treatment.  During hospitalization patient continued to have dyspnea and 2D echocardiogram was performed which showed reduced LV function.  Overnight on 09/28/2021 patient was having increased respiratory distress and received  IV Lasix and was placed on BiPAP.  Extensive conversation was held with the family who have requested  initiation of comfort care for the patient.  Comfort care was initiated 09/30/2021.  Assessment and Plan: * Multifocal pneumonia- (present on admission) Concern for aspiration pneumonia due to difficulty with swallowing.    On dysphagia 2 diet.  on  Unasyn and Zithromax.  Urinary Legionella antigen was positive.   Blood cultures and Urine culture negative.  Has been initiated on comfort care due to increased work of breathing and respiratory distress  Dyspnea- (present on admission) Likely multifactorial from pneumonia and congestive heart failure.  Received morphine drip for air hunger.  Acute respiratory failure with hypoxia (HCC) Likely secondary to multifocal pneumonia and systolic CHF.  2 D echocardiogram done from 09/28/2021 showed LV ejection fraction of 40 to 45%.  With increased respiratory distress dyspnea, morphine drip was initiated for comfort  Oral thrush- (present on admission) Nystatin swish and swallow was given during hospitalization.  Glottis carcinoma ([place])- (present on admission) Was undergoing chemo + radiation.  Patient followed up with Dr. Julien Nordmann as outpatient.  Received radiation treatment during hospitalization.   Acute systolic CHF (congestive heart failure) (HCC) 2D echocardiogram showed reduced function ejection fraction of 40 to 45%.  Received IV diuretics during hospitalization.  Cardiology also saw the patient..  Primary cancer of left upper lobe of lung ([place])- (present on admission) Despite h/o recurrent metastatic dz to brain in 2016, this adenocarcinoma appears to be in remission   Debility Deconditioning and generalized weakness.  Patient was extremely frail and deconditioned and was initiated on comfort care  Essential hypertension- (present on admission) Patient was on amlodipine and metoprolol at home.   Hypomagnesemia- (present on admission)  Latest magnesium of 1.8  Malnutrition of moderate degree   Seen by dietitian during  hospitalization  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 seizures, not intractable, without status epilepticus ([place])- (present on admission) Was on Lamictal at home   Goals of care, counseling/discussion Received a morphine drip for comfort care  Sepsis ([place]) Secondary to multifocal pneumonia.  Received antibiotic during hospitalization.   Hypokalemia. Noted during hospitalization. Was replenished.  Procedures:  Radiation treatment  BiPAP placement  Consultations:  Radiation oncology  cardiology  The results of significant diagnostics from this hospitalization (including imaging, microbiology, ancillary and laboratory) are listed below for reference.   Significant Diagnostic Studies: DG Chest 2 View  Result Date: 10/15/2021 CLINICAL DATA:  Currently undergoing chemotherapy, presents with fever and shortness of breath. EXAM: CHEST - 2 VIEW COMPARISON:  Chest CT 04/07/2021, with contrast. FINDINGS: Old left upper lobectomy. There are multiple left hilar surgical clips with left-sided volume loss, scarring in the left upper lung field. There is increased masslike fullness in the left hilar region approaching 4 cm which could be due to technical and positional differences or tumor recurrence. On the right, there is a small pleural effusion with consolidation changes in the peripheral mid and lower lung field involving both the right upper lobe periphery and the lateral basal lower lobe, findings consistent with multilobar pneumonia in this clinical setting. Remaining lungs are clear. Left sulci are sharp. There is osteopenia, degenerative changes of the spine and old left rotator cuff repair changes. An abdominal aortic endovascular repair graft is partially visible. IMPRESSION: 1. Airspace disease in the right upper and lower lobes consistent with multilobar pneumonia. Follow-up films to clearing recommended. 2. Small parapneumonic right pleural  effusion. 3. Old left upper lobectomy with increased left hilar soft tissue fullness approaching 4 cm worrisome for tumor recurrence, or could be exaggerated due to positional or technical differences. Follow-up chest CT recommended. 4. Osteopenia and degenerative change. Electronically Signed   By: Telford Nab M.D.   On: 09/21/2021 22:40   DG SWALLOW FUNC OP MEDICARE SPEECH PATH  Result Date: 09/14/2021 Table formatting from the original result was not included. Objective Swallowing Evaluation: Type of Study: MBS-Modified Barium Swallow Study  Patient Details Name: KINNIE KAUPP MRN: [id number] Date of Birth: [birth year] Today's Date: 09/14/2021 Time: SLP Start Time (ACUTE ONLY): 4098 -SLP Stop Time (ACUTE ONLY): 1400 SLP Time Calculation (min) (ACUTE ONLY): 55 min Past Medical History: Past Medical History: Diagnosis Date  AAA (abdominal aortic aneurysm)   s/p repair (aortoiliac bipass; with persistent endograft leak in inferior mesenteric artery)--stable as of 01/2017 vascular f/u.  Asymptomatic cholelithiasis 07/2015  Incidental finding on PET CT  Back pain 04/19/2016  BPH with elevated PSA   PSA signif rise 11/2017 (5.4 in 2015 to 31.21 November 2017. Bx 03/2018 benign.  Coronary artery disease   MI 1992, S/P  PTCA; negative stress test in November 2011 with no ischemia.   COVID-19 virus infection 12/07/2020  Diverticulosis   Elevated PSA 01/2018  01/22/18:  PSA 26.5.  Prostate MRI with abnormality->subsequent prostate bx BENIGN 04/12/18.PSA 72019 stbl at 24.1->rose to 36 03/2020->rec'd return to urol.  Gross hematuria 05/2018  Occurred 2+ mo's after prostate bx: attributed to bx by urol, treated empirically with keflex.  Urol initially suspected gross hem was sequela of recent prost bx. BUT recurrence of gross hem prompted full hematuria w/u 11/15/18.  Hearing loss   Bilateral   Hepatic steatosis   History of radiation therapy 11/10/13-12/12/13  lung,50Gy/53f  Hyperlipidemia  Crestor= myalgias  Hypertension   Laryngeal  mass 07/2021  with L cervical lymphadenopathy.  ENT->bx 07/29/21->invasive SCC  Lung cancer ([place]) 05/2013  non-small cell;  L upper lobectomy with mediastinal LN dissection, chemo, and radiation in 2014/2015. Remission until pt had questionable seizure 07/2015--MRI showed brain mets; palliative brain rad (stereotactic radiation therapy) started 08/25/15.  CT C/A/P clear 02/2016, 05/2016, 11/2016, 12/2018. MRI brain 12/2018 stable lesions/no new mets. Rpt 6 mo per onc.  Malignant neoplasm of upper lobe, left bronchus or lung ([place]) 10/29/2013  *?New spiculated 4.5cm mass in L upper lung field on CXR at [facility] in [place] 07/14/17.  Dr. Julien Nordmann (onc) recommended f/u CXR after abx course.  If mass unchanged then repeat CT chest.  Surveillance CT chest and MRI brain 06/2018 show no sign of dz.  Myocardial infarction [facility]) 1992  Dr Angelena Form    Obstructive uropathy   obstructive and reflux uropathy  Peripheral vascular disease ([place]) 08/2012  4.8x4.6 cm infrarenal abdominal aortic fusiform aneurysm, 1.5 cm right common iliac artery aneurysm.  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  Pneumonia 09/06/2018  XRAY was done at [place] care.  S/P radiation therapy [facility] 08/25/15  Stereotactic radiation therapy: frontoparietal 18gy,posterior frontal lobe 20gy,  Seizure disorder ([place]) 2016/2017  as sequela of brain mets;  Grand mal seizure 07/2015, then got on keppra and was seizure-free until focal motor seizures of L side of face began 02/2016- these responded well to up-titration of keppra but pt had adverse side effects so eventually pt had to be switched over to lamictal 07/2016--stable/seizure free on this med as of 09/2018 neuro f/u.  Tibial plateau fracture, right 03/2021  Tobacco dependence   "Quit" 1992, but pt has smoked "on and off" since that time  Unilateral vocal cord paralysis   left, onset s/p  mediastinoscopy with mediastinal LN resection 2014. Past Surgical History: Past Surgical History: Procedure Laterality Date  ABDOMINAL AORTIC ENDOVASCULAR STENT GRAFT N/A 05/07/2014  Procedure: ABDOMINAL AORTIC ENDOVASCULAR STENT GRAFT;  Surgeon: Serafina Mitchell, MD;  Location: [place] [facility];  Service: Vascular;  Laterality: N/A;  Carotid duplex dopplers  07/2015  1-39% on R, no signif dz noted on L  COLONOSCOPY W/ POLYPECTOMY  2003   negative 2010,due 2020; Dr Olevia Perches  CORONARY ANGIOPLASTY    no stents  CYSTOSCOPY/RETROGRADE/URETEROSCOPY Bilateral 10/09/2012  Procedure: BILATERAL RETROGRADE bladder and urethral BIOPSY ;  Surgeon: Molli Hazard, MD;  Location: [facility];  Service: Urology;  Laterality: Bilateral;  BILATERAL RETROGRADE   EEG  08/05/15  Pt placed on keppra just prior to this test due to having ? seizure (MRI showed brain mets)  HERNIA REPAIR Bilateral   Inguinal  INGUINAL HERIIORRHAPHY BILATERALLY    IR ANGIOGRAM SELECTIVE EACH ADDITIONAL VESSEL  12/03/2017  IR ANGIOGRAM VISCERAL SELECTIVE  12/03/2017  IR ANGIOGRAM VISCERAL SELECTIVE  12/03/2017  IR EMBO ARTERIAL NOT HEMORR HEMANG INC GUIDE ROADMAPPING  12/03/2017  IR GENERIC HISTORICAL  07/20/2016  IR RADIOLOGIST EVAL & MGMT 07/20/2016 [facility] INTERV RAD  IR GENERIC HISTORICAL  08/02/2016  IR RADIOLOGIST EVAL & MGMT 08/02/2016 Sandi Mariscal, MD [facility] INTERV RAD  IR RADIOLOGIST EVAL & MGMT  11/08/2017  IR RADIOLOGIST EVAL & MGMT  01/01/2018  IR RADIOLOGIST EVAL & MGMT  08/27/2019  IR RADIOLOGIST EVAL & MGMT  09/16/2020  IR US GUIDE VASC ACCESS RIGHT  12/03/2017  LARYNGOSCOPY AND ESOPHAGOSCOPY N/A 08/09/2021  Procedure: MICRO DIRECT LARYNGOSCOPY WITH BIOPSY AND ESOPHAGOSCOPY;  Surgeon: Melida Quitter, MD;  Location: [place];  Service: ENT;  Laterality: N/A;  MEDIASTINOSCOPY N/A 05/01/2013  Procedure: MEDIASTINOSCOPY;  Surgeon: Gaye Pollack, MD;  Location: [place] OR;  Service: Thoracic;  Laterality: N/A;  PFTs  04/2013  Minimal obstructive airway disease  PILONIDAL CYST EXCISION     PROSTATE BIOPSY N/A 10/09/2012  NEG bx 04/12/18 as well.  Procedure: PROSTATIC URETHRAL BIOPSY--BPH--no evidence of malignancy;  Surgeon: Molli Hazard, MD;  Location: [facility];  Service: Urology;  Laterality: N/A;  PROSTATIC URETHRAL BIOPSY  PROSTATE BIOPSY  03/2018  NEG  PTCA  1992  ROTATOR CUFF REPAIR    Bilateral  THOROCOTOMY WITH LOBECTOMY Left 05/29/2013  Procedure: LEFT THOROCOTOMY WITH LEFT UPPER LOBE LOBECTOMY;  Surgeon: Gaye Pollack, MD;  Location: [facility];  Service: Thoracic;  Laterality: Left;  TRANSTHORACIC ECHOCARDIOGRAM  07/2015  EF 50-55%, hypokin of inf myoc, grd I DD, mild MR, mod dilat of LA.  VIDEO BRONCHOSCOPY N/A 05/01/2013  Procedure: VIDEO BRONCHOSCOPY;  Surgeon: Gaye Pollack, MD;  Location: [facility];  Service: Thoracic;  Laterality: N/A; HPI: pt is a 81 yo male with recent diagnosis of left anterior laryngeal mass, with ?  artilage invasiono f mass to right of midline.and necrotic jugular adenopathy consistent with SCC.  PMH + for lung cancer diagnosed 05/2013 s/p surgery and XRT 3/15-4/25/2015 - 50 gy/25 fx. Per pt, he saw ENT after lung surgery but left vocal cord paralysis did not improve.  Medication list includes pantoprazole, amlodipine, ASA, inhaler.   PMH + for smoking - stopped in 1992, syncope, seizure, diverticulosis, MI, increased PSA.  MRI of brain 12/2020 showed stable brain mets s/p treatment to right frontoparietal lobes.  Per referring Md, pt informed her that he coughs after eating.  Pt admits he does consume liquids and sometimes "breads" to get down large pills.  He can not isolate exact location of pill stasis.  Pt has started chemoradiation for his laryngeal mass.  Subjective: pt awake in chair  Recommendations for follow up therapy are one component of a multi-disciplinary discharge planning process, led by the attending physician.  Recommendations may be updated based on patient status, additional functional criteria and insurance authorization. Assessment / Plan /  Recommendation Clinical Impressions 09/14/2021 Clinical Impression Pt presents with minimal oral and mild pharyngeal dysphagia mostly characterized by decreased laryngeal vestibule closure allows minimal laryngeal penetration of thin and nectar liquids.  Puree transited easily through pharynx but small portion of graham cracker retention noted at vallecular space without sensation.  Liquid wash faciliates clearance.  Mild difficulty transiting tablet into pharynx with pt needing extra liquids.  Barium tablet halted at vallecular space, requiring pudding bolus to transit.  Per pt, he sensed tablet lodge at pharynx when it later was in esophagus.  Recommend pt continue diet as tolerated with strict precautions.  Advised pt start intake with liquids *WATER preferred to compensate for xerostomia, follow solids with liquids, occasional throat clear and swallow to clear potential laryngeal penetration.  Effortful swallow indicated to improve muscle fiber recruitment.  Recommend follow up with OP SLP to address dysphagia/goals including implementing exercise, etc.  Using video loops, educated pt to findings/recommendations. Advised pt that he will eventually have a harder time swallow foods more than liquid due to fibrosis and continuing po as long as possible/tolerates during treatment is indicated.  Informed pt of importance to maintain strength of cough and "hock" for airway protection with  pt returning demonstration of "hock".  Pt reported understanding to all information provided.  SLP did not test chin tuck nor head turn posture as they were not needed during this assessment. SLP Visit Diagnosis Dysphagia, oropharyngeal phase (R13.12) Attention and concentration deficit following -- Frontal lobe and executive function deficit following -- Impact on safety and function Mild aspiration risk   No flowsheet data found.  No flowsheet data found. Diet Recommendations 09/14/2021 SLP Diet Recommendations Regular solids;Thin  liquid extra gravy/sauces Liquid Administration via Cup - pt does not use straws Medication Administration Whole meds with puree; start and follow with liquids Compensations Slow rate;Small sips/bites;Follow solids with liquid;Clear throat intermittently; start all intake with liquids Postural Changes Seated upright at 90 degrees;Remain semi-upright after after feeds/meals (keep cough/hock strong)   No flowsheet data found. No flowsheet data found.  Oral Phase 09/14/2021 Oral Phase Impaired Oral - Pudding Teaspoon -- Oral - Pudding Cup -- Oral - Honey Teaspoon -- Oral - Honey Cup -- Oral - Nectar Teaspoon -- Oral - Nectar Cup WFL Oral - Nectar Straw -- Oral - Thin Teaspoon -- Oral - Thin Cup WFL Oral - Thin Straw WFL Oral - Puree WFL Oral - Mech Soft WFL Oral - Regular -- Oral - Multi-Consistency -- Oral - Pill Decreased bolus cohesion;Reduced posterior propulsion;Premature spillage Oral Phase - Comment pt required 3 boluses of thin barium to transit tablet into pharynx from oral cavity  Pharyngeal Phase 09/14/2021 Pharyngeal Phase Impaired Pharyngeal- Pudding Teaspoon -- Pharyngeal -- Pharyngeal- Pudding Cup -- Pharyngeal -- Pharyngeal- Honey Teaspoon -- Pharyngeal -- Pharyngeal- Honey Cup -- Pharyngeal -- Pharyngeal- Nectar Teaspoon -- Pharyngeal -- Pharyngeal- Nectar Cup Reduced airway/laryngeal closure;Penetration/Aspiration during swallow;Penetration/Aspiration before swallow Pharyngeal Material enters airway, remains ABOVE vocal cords and not ejected out Pharyngeal- Nectar Straw -- Pharyngeal -- Pharyngeal- Thin Teaspoon -- Pharyngeal -- Pharyngeal- Thin Cup Reduced airway/laryngeal closure Pharyngeal Material enters airway, remains ABOVE vocal cords and not ejected out;Material enters airway, CONTACTS cords and not ejected out Pharyngeal- Thin Straw Reduced airway/laryngeal closure;Penetration/Aspiration during swallow Pharyngeal Material enters airway, remains ABOVE vocal cords and not ejected out Pharyngeal-  Puree WFL Pharyngeal Material does not enter airway;Other (Comment) Pharyngeal- Mechanical Soft Reduced epiglottic inversion;Pharyngeal residue - valleculae Pharyngeal Material does not enter airway Pharyngeal- Regular -- Pharyngeal -- Pharyngeal- Multi-consistency -- Pharyngeal -- Pharyngeal- Pill Reduced epiglottic inversion;Reduced tongue base retraction;Pharyngeal residue - valleculae Pharyngeal Material does not enter airway Pharyngeal Comment cued throat clear removed trace penetrates, a single episode of potential trace silent aspiration of thin/secretions observed, effortful swallow appeared to faciliate pharyngeal clearance, following solids with liquids aids pharyngeal clearance of solids  Cervical Esophageal Phase  09/14/2021 Cervical Esophageal Phase Impaired Pudding Teaspoon -- Pudding Cup -- Honey Teaspoon -- Honey Cup -- Nectar Teaspoon -- Nectar Cup Prominent cricopharyngeal segment Nectar Straw Prominent cricopharyngeal segment Thin Teaspoon -- Thin Cup Prominent cricopharyngeal segment Thin Straw Prominent cricopharyngeal segment Puree Prominent cricopharyngeal segment Mechanical Soft Prominent cricopharyngeal segment Regular -- Multi-consistency -- Pill Prominent cricopharyngeal segment Cervical Esophageal Comment Prominent Cricopharyngeus incidentally noted that did not impair barium flow. Kathleen Lime, MS [place] [phone] Cell [phone] Macario Golds 09/14/2021, 2:33 PM    CLINICAL DATA:  Dysphagia with solid foods and pills sticking in the throat. History of lung and laryngeal cancer, the latter being treated with radiation therapy. Left neck mass. EXAM: MODIFIED BARIUM SWALLOW TECHNIQUE: Different consistencies of barium were administered orally to the patient by the Speech Pathologist. Imaging of the  pharynx was performed in the lateral projection. Gareth Eagle, PA was present in the fluoroscopy room during this study, which was supervised and interpreted by  myself. FLUOROSCOPY TIME:  Fluoroscopy Time:  1 minute and 36 seconds Radiation Exposure Index (if provided by the fluoroscopic device): 10.6 mGy Number of Acquired Spot Images: 0 COMPARISON:  PET-CT 08/26/2021 and CT neck 07/22/2021. FINDINGS: Vestibular Penetration: Flash penetration with thin and nectar barium. Aspiration:  None seen. Other: Mild vallecular pooling. Barium tablet was temporarily retained within the vallecula but passed into the thoracic esophagus after ingesting Phillip Heal cracker. Prominent posterior cricopharyngeal impression on the cervical esophagus. Mild thoracic esophageal dysmotility. IMPRESSION: Laryngeal penetration with thin and nectar barium. No aspiration identified. Vallecular pooling and esophageal dysmotility. Please refer to speech therapist's report for additional details and recommendations. Electronically Signed   By: Richardean Sale M.D.   On: 09/14/2021 14:02   DG CHEST PORT 1 VIEW  Result Date: 09/29/2021 CLINICAL DATA:  Dyspnea. EXAM: PORTABLE CHEST 1 VIEW COMPARISON:  Chest x-ray dated September 26, 2021. FINDINGS: Stable cardiomediastinal silhouette with surgical clips in the left hilum and similar left parahilar post treatment change. Worsened consolidation in the right mid and lower lung. No pneumothorax or large pleural effusion. No acute osseous abnormality. IMPRESSION: 1. Worsening multifocal pneumonia in the right lung. Electronically Signed   By: Titus Dubin M.D.   On: 09/29/2021 13:09   ECHOCARDIOGRAM COMPLETE  Result Date: 09/29/2021    ECHOCARDIOGRAM REPORT   Patient Name:   KANNON GRANDERSON Date of Exam: 09/28/2021 Medical Rec #:  [id number]      Height:       64.0 in Accession #:    [id number]     Weight:       140.7 lb Date of Birth:  08-Jun-1941       BSA:          1.685 m Patient Age:    38 years       BP:           128/84 mmHg Patient Gender: M              HR:           120 bpm. Exam Location:  Inpatient Procedure: 2D Echo, Cardiac Doppler and Color Doppler  Indications:     Dyspnea  History:         Patient has prior history of Echocardiogram examinations, most                  recent 08/05/2015. CAD and Previous Myocardial Infarction, PAD;                  Risk Factors:Dyslipidemia and Hypertension.  Sonographer:     Maudry Mayhew MHA, RDMS, RVT, RDCS Referring Phys:  [id number] [facility] Clarise Chacko Diagnosing Phys: [facility]  Sonographer Comments: Image acquisition challenging due to patient body habitus. IMPRESSIONS  1. Cannot fully assess wall motion. . Left ventricular ejection fraction, by estimation, is 40 to 45%. The left ventricle has mildly decreased function.  2. Right ventricular systolic function is normal. The right ventricular size is normal. There is moderately elevated pulmonary artery systolic pressure.  3. RA mass present, differential includes RA thrombus vs. eustachian valve.  4. The mitral valve is grossly normal. No evidence of mitral valve regurgitation.  5. The aortic valve is grossly normal. Aortic valve regurgitation is not visualized.  6. The inferior vena cava is dilated in size with >50% respiratory variability,  suggesting right atrial pressure of 8 mmHg. Comparison(s): No prior Echocardiogram. FINDINGS  Left Ventricle: Cannot fully assess wall motion. Left ventricular ejection fraction, by estimation, is 40 to 45%. The left ventricle has mildly decreased function. The left ventricular internal cavity size was normal in size. There is no left ventricular hypertrophy. Right Ventricle: The right ventricular size is normal. No increase in right ventricular wall thickness. Right ventricular systolic function is normal. There is moderately elevated pulmonary artery systolic pressure. The tricuspid regurgitant velocity is 3.50 m/s, and with an assumed right atrial pressure of 8 mmHg, the estimated right ventricular systolic pressure is 46.5 mmHg. Left Atrium: Left atrial size was normal in size. Right Atrium: RA mass present, differential includes  RA thrombus vs. eustachian valve. Right atrial size was normal in size. Pericardium: There is no evidence of pericardial effusion. Mitral Valve: The mitral valve is grossly normal. No evidence of mitral valve regurgitation. Tricuspid Valve: The tricuspid valve is grossly normal. Tricuspid valve regurgitation is not demonstrated. Aortic Valve: The aortic valve is grossly normal. Aortic valve regurgitation is not visualized. Aortic valve mean gradient measures 11.0 mmHg. Aortic valve peak gradient measures 22.7 mmHg. Aortic valve area, by VTI measures 0.89 cm. Pulmonic Valve: Pulmonic valve regurgitation is not visualized. Aorta: The aortic root and ascending aorta are structurally normal, with no evidence of dilitation. Venous: The inferior vena cava is dilated in size with greater than 50% respiratory variability, suggesting right atrial pressure of 8 mmHg.  LEFT VENTRICLE PLAX 2D LVIDd:         4.80 cm LVIDs:         3.40 cm LV PW:         0.60 cm LV IVS:        0.50 cm LVOT diam:     1.60 cm LV SV:         30 LV SV Index:   18 LVOT Area:     2.01 cm  RIGHT VENTRICLE TAPSE (M-mode): 1.7 cm LEFT ATRIUM           Index        RIGHT ATRIUM           Index LA diam:      3.40 cm 2.02 cm/m   RA Area:     16.80 cm LA Vol (A2C): 73.3 ml 43.51 ml/m  RA Volume:   43.30 ml  25.70 ml/m LA Vol (A4C): 33.7 ml 20.01 ml/m  AORTIC VALVE AV Area (Vmax):    0.90 cm AV Area (Vmean):   1.04 cm AV Area (VTI):     0.89 cm AV Vmax:           238.00 cm/s AV Vmean:          154.000 cm/s AV VTI:            0.340 m AV Peak Grad:      22.7 mmHg AV Mean Grad:      11.0 mmHg LVOT Vmax:         107.00 cm/s LVOT Vmean:        79.600 cm/s LVOT VTI:          0.151 m LVOT/AV VTI ratio: 0.44  AORTA Ao Root diam: 2.95 cm TRICUSPID VALVE TR Peak grad:   49.0 mmHg TR Vmax:        350.00 cm/s  SHUNTS Systemic VTI:  0.15 m Systemic Diam: 1.60 cm Phineas Inches Electronically signed by Phineas Inches Signature Date/Time: 09/28/2021/5:37:19 PM  Final  (Updated)     Microbiology: Recent Results (from the past 240 hour(s))  Resp Panel by RT-PCR (Flu A&B, Covid)     Status: None   Collection Time: 09/21/2021 10:10 PM   Specimen: Nasopharyngeal(NP) swabs in vial transport medium  Result Value Ref Range Status   SARS Coronavirus 2 by RT PCR NEGATIVE NEGATIVE Final    Comment: (NOTE) SARS-CoV-2 target nucleic acids are NOT DETECTED.  The SARS-CoV-2 RNA is generally detectable in upper respiratory specimens during the acute phase of infection. The lowest concentration of SARS-CoV-2 viral copies this assay can detect is 138 copies/mL. A negative result does not preclude SARS-Cov-2 infection and should not be used as the sole basis for treatment or other patient management decisions. A negative result may occur with  improper specimen collection/handling, submission of specimen other than nasopharyngeal swab, presence of viral mutation(s) within the areas targeted by this assay, and inadequate number of viral copies(<138 copies/mL). A negative result must be combined with clinical observations, patient history, and epidemiological information. The expected result is Negative.  Fact Sheet for Patients:  EntrepreneurPulse.com.au  Fact Sheet for Healthcare Providers:  IncredibleEmployment.be  This test is no t yet approved or cleared by the [place] FDA and  has been authorized for detection and/or diagnosis of SARS-CoV-2 by FDA under an Emergency Use Authorization (EUA). This EUA will remain  in effect (meaning this test can be used) for the duration of the COVID-19 declaration under Section 564(b)(1) of the Act, 21 U.S.C.section 360bbb-3(b)(1), unless the authorization is terminated  or revoked sooner.       Influenza A by PCR NEGATIVE NEGATIVE Final   Influenza B by PCR NEGATIVE NEGATIVE Final    Comment: (NOTE) The Xpert Xpress SARS-CoV-2/FLU/RSV plus assay is intended as an aid in the  diagnosis of influenza from Nasopharyngeal swab specimens and should not be used as a sole basis for treatment. Nasal washings and aspirates are unacceptable for Xpert Xpress SARS-CoV-2/FLU/RSV testing.  Fact Sheet for Patients: EntrepreneurPulse.com.au  Fact Sheet for Healthcare Providers: IncredibleEmployment.be  This test is not yet approved or cleared by the [place] FDA and has been authorized for detection and/or diagnosis of SARS-CoV-2 by FDA under an Emergency Use Authorization (EUA). This EUA will remain in effect (meaning this test can be used) for the duration of the COVID-19 declaration under Section 564(b)(1) of the Act, 21 U.S.C. section 360bbb-3(b)(1), unless the authorization is terminated or revoked.  Performed at [facility], [place] [address] [place], [place] [address]   Culture, blood (routine x 2)     Status: None   Collection Time: 10/10/2021 10:11 PM   Specimen: BLOOD  Result Value Ref Range Status   Specimen Description   Final    BLOOD BLOOD RIGHT FOREARM Performed at [facility] [address], [place], [place] [address]    Special Requests   Final    BOTTLES DRAWN AEROBIC AND ANAEROBIC Blood Culture adequate volume Performed at [facility] [address] [place], [place] [address]    Culture   Final    NO GROWTH 5 DAYS Performed at [facility], [place] [address], [place], [place] [address]    Report Status Oct 30, 2021 FINAL  Final  Urine Culture     Status: Abnormal   Collection Time: 10/02/2021 10:11 PM   Specimen: In/Out Cath Urine  Result Value Ref Range Status   Specimen Description   Final    IN/OUT  CATH URINE Performed at [facility], [place] [address] [place], [place] [address]    Special Requests   Final    NONE Performed at [facility], [place] [address], [place], [place] [address]    Culture (A)   Final    40,000 COLONIES/mL AEROCOCCUS SPECIES 5,000 COLONIES/mL DIPHTHEROIDS(CORYNEBACTERIUM SPECIES) Standardized susceptibility testing for this organism is not available. Performed at [facility], [place] [address] [place], [place] [address]    Report Status 09/28/2021 FINAL  Final  Culture, blood (routine x 2)     Status: None   Collection Time: 10/18/2021 10:30 PM   Specimen: BLOOD  Result Value Ref Range Status   Specimen Description   Final    BLOOD RIGHT ANTECUBITAL Performed at [facility] [address], [place], [place] [address]    Special Requests   Final    BOTTLES DRAWN AEROBIC AND ANAEROBIC Blood Culture adequate volume Performed at [facility] [address], [place], [place] [address]    Culture   Final    NO GROWTH 5 DAYS Performed at [facility], [place] [address], [place], [place] [address]    Report Status 10-09-21 FINAL  Final  Expectorated Sputum Assessment w Gram Stain, Rflx to Resp Cult     Status: None   Collection Time: 09/27/21  1:38 AM   Specimen: Expectorated Sputum  Result Value Ref Range Status   Specimen Description EXPSU  Final   Special Requests NONE  Final   Sputum evaluation   Final    THIS SPECIMEN IS ACCEPTABLE FOR SPUTUM CULTURE Performed at [facility], [place] [address] [place], [place] [address]    Report Status 09/27/2021 FINAL  Final  Culture, Respiratory w Gram Stain     Status: None   Collection Time: 09/27/21  1:38 AM  Result Value Ref Range Status   Specimen Description   Final    EXPSU Performed at [facility], [place] [address], [place], [place] [address]    Special Requests   Final    NONE Reflexed from [id number] Performed at [facility], [place] [address] [place], [place] [address]    Gram Stain   Final    RARE SQUAMOUS EPITHELIAL CELLS PRESENT RARE WBC PRESENT, PREDOMINANTLY MONONUCLEAR FEW GRAM POSITIVE COCCI IN  PAIRS RARE GRAM NEGATIVE RODS    Culture   Final    MODERATE Normal respiratory flora-no Staph aureus or Pseudomonas seen Performed at [facility], [address] [place], [place] [address]    Report Status 09/29/2021 FINAL  Final  MRSA Next Gen by PCR, Nasal     Status: None   Collection Time: 09/27/21  1:50 AM   Specimen: Nasal Mucosa; Nasal Swab  Result Value Ref Range Status   MRSA by PCR Next Gen NOT DETECTED NOT DETECTED Final    Comment: (NOTE) The GeneXpert MRSA Assay (FDA approved for NASAL specimens only), is one component of a comprehensive MRSA colonization surveillance program. It is not intended to diagnose MRSA infection nor to guide or monitor treatment for MRSA infections. Test performance is not FDA approved in patients less than 47 years old. Performed at [facility], [place] [address], [place],  [address]      Signed: Flora Lipps, MD 10-09-21

## 2021-10-19 NOTE — Assessment & Plan Note (Signed)
Secondary to multifocal pneumonia.  Received antibiotic during hospitalization.

## 2021-10-19 NOTE — Progress Notes (Signed)
Pt resting comfortably MSO4 gtt 4mg /hr, dtg at BS, O2 at 6 liters. Gentle mouthcare complete, bld tinged secretions. Appears comfortable. Emotional support provided to family. SRP, RN

## 2021-10-19 NOTE — Progress Notes (Signed)
Rounding on pt, pt actively transitioning/dying. Pt with agonal respirations and gasping breathe sounds. MD updated of changes... SRP, RN

## 2021-10-19 NOTE — Progress Notes (Addendum)
PROGRESS NOTE    Jordan Mccullough  RFF:[id number] DOB: 24-Jul-1941 DOA: 09/21/2021 PCP: Tammi Sou, MD    Brief Narrative:  Jordan Mccullough is a 81 y.o. 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  Patient was febrile at home at 103 F.  In the ED, patient met SIRS criteria with leukocytosis tachycardia and fever but lactate was 0.8.  Patient received IV fluid bolus and IV antibiotic in the ED and was admitted hospital for further evaluation and treatment.  During hospitalization patient continued to have dyspnea and 2D echocardiogram was performed which showed reduced LV function.  Overnight on 09/28/2021 patient was having increased respiratory distress and received  IV Lasix and was placed on BiPAP.  Extensive conversation was held with the family who have requested initiation of comfort care for the patient.  Comfort care was initiated 09/30/2021.     Assessment and Plan: * Multifocal pneumonia- (present on admission) Concern for aspiration pneumonia due to difficulty with swallowing.    On dysphagia 2 diet.  on  Unasyn and Zithromax.  Urinary Legionella antigen was positive.   Blood cultures and Urine culture negative.  Has been initiated on comfort care due to increased work of breathing and respiratory distress  Dyspnea- (present on admission) Likely multifactorial from pneumonia and congestive heart failure.  Currently on morphine drip for air hunger.  Acute respiratory failure with hypoxia (HCC) Likely secondary to multifocal pneumonia and systolic CHF.  2 D echocardiogram done from 09/28/2021 showed LV ejection fraction of 40 to 45%.  With  increased respiratory distress dyspnea.  On morphine drip for air hunger.  Oral thrush- (present on admission) Nystatin swish and swallow if able.  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  Patient follows up with Dr. Julien Nordmann as outpatient.  Received radiation treatment during hospitalization.  Patient is unstable for further treatment at this time.  Currently on comfort care    Acute systolic CHF (congestive heart failure) ([place]) 2D echocardiogram shows reduced function ejection fraction of 40 to 45%.  On IV diuretics, supplemental oxygen.  Cardiology has seen the patient.  On morphine drip at this time.  Primary cancer of left upper lobe of lung ([place])- (present on admission) Despite h/o recurrent metastatic dz to brain in 2016, this adenocarcinoma appears to be in remission currently.    Debility Deconditioning and generalized weakness.  Patient is extremely frail and deconditioned currently on comfort care  Essential hypertension- (present on admission) On amlodipine and metoprolol at home.  Currently on hold.  Hypomagnesemia- (present on admission)  Latest magnesium of 1.8  Malnutrition of moderate degree Currently on comfort care.  Seen by dietitian during hospitalization  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 seizures, not intractable, without status epilepticus ([place])- (present on admission) on lamictal as able.   Goals of care, counseling/discussion Currently on comfort care.  Continue morphine drip.     DVT prophylaxis: enoxaparin (LOVENOX) injection 40 mg Start: 09/27/21 1000   Code Status:     Code Status: DNR  Disposition: Likely in the hospital death  Status is: Inpatient  Remains inpatient appropriate because: Respiratory distress, declining condition, comfort care on morphine drip  Family Communication:  Spoke with the daughter and son-in-law at  bedside  Consultants:  Radiation oncology  Procedures:  Radiation treatment  Antimicrobials:  Unasyn IV 09/27/21> Azithromycin 2/8>  Subjective:  Today, patient was seen and examined at bedside.  Gasping at the time of my evaluation.  On morphine drip.  Appears comfortable overall. Objective: Vitals:   09/30/21 0014 09/30/21 0054 09/30/21 0405 09/30/21 0810  BP: 126/81  (!) 143/81   Pulse: (!) 106  (!) 108 (!) 122  Resp: 20   (!) 31  Temp: 98.4 F (36.9 C)  98.2 F (36.8 C)   TempSrc: Oral  Oral   SpO2: (!) 78% 94% 93% 97%  Weight:      Height:        Intake/Output Summary (Last 24 hours) at Oct 15, 2021 1035 Last data filed at 09/30/2021 1846 Gross per 24 hour  Intake 558.76 ml  Output --  Net 558.76 ml   Filed Weights   10/05/2021 2130 09/27/21 1437  Weight: 64 kg 63.8 kg   Body mass index is 24.14 kg/m.   Physical Examination:.  General:  Average built, gasping, unresponsive, HENT:   No scleral pallor or icterus noted. Oral mucosa is moist.  Chest:  diminished breath sounds bilaterally.   CVS: S1 &S2 heard.  Abdomen: Soft, nontender, nondistended.  Extremities: Moves  psych: Lethargic, gasping, patient comfortable CNS: Decreased responsiveness Skin: Warm and dry.  Data Reviewed:   CBC: Recent Labs  Lab 10/17/2021 2210 09/27/21 0323 09/28/21 0410 09/29/21 0348 09/30/21 0714  WBC 19.6* 17.0* 12.2* 16.6* 4.4  NEUTROABS 16.6*  --   --   --   --   HGB 11.9* 10.9* 10.7* 10.3* 10.4*  HCT 35.0* 32.3* 31.5* 30.4* 32.0*  MCV 85.4 87.8 86.8 85.4 92.5  PLT 184 148* 146* 134* 453    Basic Metabolic Panel: Recent Labs  Lab 10/08/2021 2210 09/27/21 0323 09/28/21 0410 09/29/21 0348 09/30/21 0617  NA 130* 132* 134* 134* 135  K 3.7 3.6 3.8 3.2* 3.6  CL 98 100 101 99 107  CO2 _0 11*  GLUCOSE 111* 105* 114* 122* 180*  BUN _1 43*  CREATININE 0.89 0.88 0.89 0.92 1.22  CALCIUM 8.7* 8.2* 8.3* 8.1* 9.3  MG  --   --  1.6* 2.1 1.8  PHOS  --    --  2.6  --   --     Liver Function Tests: Recent Labs  Lab 09/25/2021 2210 09/28/21 0410  AST 11* 16  ALT 13 13  ALKPHOS 59 54  BILITOT 0.5 0.4  PROT 7.1 6.3*  ALBUMIN 3.4* 2.8*     Radiology Studies: DG CHEST PORT 1 VIEW  Result Date: 09/29/2021 CLINICAL DATA:  Dyspnea. EXAM: PORTABLE CHEST 1 VIEW COMPARISON:  Chest x-ray dated September 26, 2021. FINDINGS: Stable cardiomediastinal silhouette with surgical clips in the left hilum and similar left parahilar post treatment change. Worsened consolidation in the right mid and lower lung. No pneumothorax or large pleural effusion. No acute osseous abnormality. IMPRESSION: 1. Worsening multifocal pneumonia in the right lung. Electronically Signed   By: Titus Dubin M.D.   On: 09/29/2021 13:09      LOS: 5 days    Flora Lipps, MD [facility] 2021-10-15, 10:35 AM

## 2021-10-19 NOTE — Progress Notes (Signed)
Pt appears Comfortable, no acute change, will continue to monitor and provide support to family as needed. SRP, RN

## 2021-10-19 NOTE — Progress Notes (Signed)
Pt transported to the morgue, CN and NT assist. SP, RN

## 2021-10-19 NOTE — Progress Notes (Signed)
Pt deceased, verified by and pronounced by Theora Gianotti, RN and Brien Mates, RN CN.. Pt OFFICIAL time of death 9, MD made aware, family at bedside at the time of death. NO notable Pulses-Carotid, Apical, Radial, Femoral, NO breathe sounds noted. Heart monitor ASYSTOLE... SRP

## 2021-10-19 NOTE — Progress Notes (Signed)
Pt continues to appear comfortable, will continue to monitor. Previous assessment unchanged. Pt spouse arrived to visit with pt. SRP, RN

## 2021-10-19 DEATH — deceased

## 2021-10-20 ENCOUNTER — Ambulatory Visit: Payer: Medicare Other

## 2021-10-21 ENCOUNTER — Ambulatory Visit: Payer: Medicare Other

## 2021-10-24 ENCOUNTER — Ambulatory Visit: Payer: Medicare Other

## 2021-10-25 ENCOUNTER — Ambulatory Visit: Payer: Medicare Other

## 2021-10-26 ENCOUNTER — Ambulatory Visit: Payer: Medicare Other

## 2021-10-27 ENCOUNTER — Ambulatory Visit: Payer: Medicare Other

## 2021-10-28 ENCOUNTER — Ambulatory Visit: Payer: Medicare Other

## 2021-11-08 ENCOUNTER — Ambulatory Visit: Payer: Self-pay | Admitting: Physical Therapy

## 2021-11-25 ENCOUNTER — Other Ambulatory Visit (HOSPITAL_COMMUNITY): Payer: Medicare Other | Admitting: Dentistry

## 2021-12-30 ENCOUNTER — Other Ambulatory Visit: Payer: Medicare Other

## 2022-01-02 ENCOUNTER — Ambulatory Visit: Payer: Self-pay | Admitting: Radiation Oncology

## 2022-04-10 ENCOUNTER — Other Ambulatory Visit: Payer: Medicare Other

## 2022-04-12 ENCOUNTER — Ambulatory Visit: Payer: Medicare Other | Admitting: Internal Medicine

## 2022-06-05 NOTE — Progress Notes (Signed)
  Radiation Oncology         [phone] ________________________________  Name: Jordan Mccullough MRN: [id number]  Date: 10/03/2021  DOB: 01-31-1941  End of Treatment Note  Diagnosis: Glottic Cancer     Cancer Staging  Glottis carcinoma [facility]) Staging form: Larynx - Glottis, AJCC 8th Edition - Clinical stage from 08/29/2021: Stage IVA (cT2, cN2a, cM0) - Signed by Eppie Gibson, MD on 08/29/2021  Primary cancer of left upper lobe of lung ([place]) Staging form: Lung, AJCC 7th Edition - Clinical: Stage IIIA (T2a, N2, M0) - Signed by [name], MD on 06/19/2013 Histopathologic type: Adenocarcinoma, NOS Laterality: Left - Pathologic: No stage assigned - Unsigned Histopathologic type: Adenocarcinoma, NOS Laterality: Left      Indication for treatment:  Curative       Radiation treatment dates:   09/07/2021 through 09/29/2021  Site/dose:   Larynx and bilateral neck (he received 30 Gray in 15 fractions before stopping treatment prematurely)  Technique:   IMRT  Narrative: The patient developed respiratory symptoms and was admitted to the hospital after 2-1/2 weeks of radiation therapy.  He was diagnosed with multifocal pneumonia. Unfortunately, he passed away during his hospitalization.  -----------------------------------  Eppie Gibson, MD

## 2022-07-10 NOTE — Therapy (Signed)
[facility] [address], [place], [place], [address] Phone: [phone]   Fax:  [phone]  Patient Details  Name: Jordan Mccullough MRN: [id number] Date of Birth: Apr 25, 1941 Referring Provider:  Eppie Gibson, MD  Encounter Date: 07/10/2022  SPEECH THERAPY DISCHARGE SUMMARY  Visits from Start of Care: 1 (eval)  Current functional level related to goals / functional outcomes: Goals and plan derived during evaluation in January 2023 are below.   SLP Short Term Goals - 09/16/21 0935                SLP SHORT TERM GOAL #1    Title pt will complete HEP with rare min A     Time 1     Period --   visits, for all STGs    Status New          SLP SHORT TERM GOAL #2    Title pt will tell SLP why pt is completing HEP with modified independence in 2 sessions     Time 2     Status New          SLP SHORT TERM GOAL #3    Title pt will describe 3 overt s/s aspiration PNA with modified independence     Time 2     Status New          SLP SHORT TERM GOAL #4    Title pt will tell SLP how a food journal could hasten return to a more normalized diet     Time 3     Status New                     SLP Long Term Goals - 09/16/21 9163                SLP LONG TERM GOAL #1    Title pt will complete HEP with modified independence over two visits     Time 4     Period --   visits, for all LTGs    Status New          SLP LONG TERM GOAL #2    Title pt will describe how to modify HEP over time, and the timeline associated with reduction in HEP frequency with modified independence over two sessions     Time 5     Status New                     Plan - 09/16/21 0915       Clinical Impression Statement At this time pt swallowing is deemed WNL/WFL with MBSS recommended regular/thin with precautinos as in "s".. SLP designed an individualized HEP for dysphagia and pt completed each exercise on their own with total cues faded to mod  indpendent. There are no overt s/s aspiration reported by pt at this time. Data indicate that pt's swallow ability will likely decrease over the course of radiation therapy and could very well decline over time following conclusion of their radiation therapy due to muscle disuse atrophy and/or muscle fibrosis. Pt will cont to need to be seen by SLP in order to assess safety of PO intake, assess the need for recommending any objective swallow assessment, and ensuring pt correctly completes the individualized HEP.      Remaining deficits: Unfortunately, pt passed mid- rad treatment.   Education / Equipment: HEP procedure, late effects head/neck radiation on swallow function  Patient agrees to discharge. Patient goals were not met. Patient is being discharged due to not returning since the last visit.Marland Kitchen    [facility], [place] 07/10/2022, 8:53 AM  [facility] [place] [address], [place], [place], [address] Phone: [phone]   Fax:  [phone]
